# Patient Record
Sex: Male | Born: 1960
Health system: Southern US, Community
[De-identification: ages and names within clinical notes are randomized; demographics above are authoritative.]

## PROBLEM LIST (undated history)

## (undated) DIAGNOSIS — M109 Gout, unspecified: Secondary | ICD-10-CM

## (undated) DIAGNOSIS — F32A Depression, unspecified: Secondary | ICD-10-CM

## (undated) DIAGNOSIS — T7840XA Allergy, unspecified, initial encounter: Secondary | ICD-10-CM

## (undated) DIAGNOSIS — K5790 Diverticulosis of intestine, part unspecified, without perforation or abscess without bleeding: Secondary | ICD-10-CM

## (undated) DIAGNOSIS — S329XXA Fracture of unspecified parts of lumbosacral spine and pelvis, initial encounter for closed fracture: Secondary | ICD-10-CM

## (undated) DIAGNOSIS — K219 Gastro-esophageal reflux disease without esophagitis: Secondary | ICD-10-CM

## (undated) DIAGNOSIS — M810 Age-related osteoporosis without current pathological fracture: Secondary | ICD-10-CM

## (undated) DIAGNOSIS — N200 Calculus of kidney: Secondary | ICD-10-CM

## (undated) DIAGNOSIS — F419 Anxiety disorder, unspecified: Secondary | ICD-10-CM

## (undated) DIAGNOSIS — Z8619 Personal history of other infectious and parasitic diseases: Secondary | ICD-10-CM

## (undated) DIAGNOSIS — K5792 Diverticulitis of intestine, part unspecified, without perforation or abscess without bleeding: Secondary | ICD-10-CM

## (undated) DIAGNOSIS — D649 Anemia, unspecified: Secondary | ICD-10-CM

## (undated) DIAGNOSIS — K648 Other hemorrhoids: Secondary | ICD-10-CM

## (undated) DIAGNOSIS — J449 Chronic obstructive pulmonary disease, unspecified: Secondary | ICD-10-CM

## (undated) DIAGNOSIS — Z7189 Other specified counseling: Secondary | ICD-10-CM

## (undated) DIAGNOSIS — M069 Rheumatoid arthritis, unspecified: Secondary | ICD-10-CM

## (undated) DIAGNOSIS — I10 Essential (primary) hypertension: Secondary | ICD-10-CM

## (undated) DIAGNOSIS — F329 Major depressive disorder, single episode, unspecified: Secondary | ICD-10-CM

## (undated) DIAGNOSIS — E785 Hyperlipidemia, unspecified: Secondary | ICD-10-CM

## (undated) DIAGNOSIS — C880 Waldenstrom macroglobulinemia: Principal | ICD-10-CM

## (undated) DIAGNOSIS — Z950 Presence of cardiac pacemaker: Secondary | ICD-10-CM

## (undated) DIAGNOSIS — M1990 Unspecified osteoarthritis, unspecified site: Secondary | ICD-10-CM

## (undated) DIAGNOSIS — R0789 Other chest pain: Secondary | ICD-10-CM

## (undated) DIAGNOSIS — D509 Iron deficiency anemia, unspecified: Secondary | ICD-10-CM

## (undated) DIAGNOSIS — R7309 Other abnormal glucose: Secondary | ICD-10-CM

## (undated) HISTORY — DX: Allergy, unspecified, initial encounter: T78.40XA

## (undated) HISTORY — DX: Diverticulosis of intestine, part unspecified, without perforation or abscess without bleeding: K57.90

## (undated) HISTORY — DX: Essential (primary) hypertension: I10

## (undated) HISTORY — DX: Diverticulitis of intestine, part unspecified, without perforation or abscess without bleeding: K57.92

## (undated) HISTORY — DX: Other abnormal glucose: R73.09

## (undated) HISTORY — DX: Anemia, unspecified: D64.9

## (undated) HISTORY — DX: Unspecified osteoarthritis, unspecified site: M19.90

## (undated) HISTORY — DX: Rheumatoid arthritis, unspecified: M06.9

## (undated) HISTORY — DX: Anxiety disorder, unspecified: F41.9

## (undated) HISTORY — PX: SPINE SURGERY: SHX786

## (undated) HISTORY — DX: Iron deficiency anemia, unspecified: D50.9

## (undated) HISTORY — DX: Depression, unspecified: F32.A

## (undated) HISTORY — DX: Hyperlipidemia, unspecified: E78.5

## (undated) HISTORY — PX: FINGER SURGERY: SHX640

## (undated) HISTORY — DX: Other specified counseling: Z71.89

## (undated) HISTORY — DX: Age-related osteoporosis without current pathological fracture: M81.0

## (undated) HISTORY — DX: Major depressive disorder, single episode, unspecified: F32.9

## (undated) HISTORY — PX: KNEE SURGERY: SHX244

## (undated) HISTORY — DX: Other chest pain: R07.89

## (undated) HISTORY — PX: OTHER SURGICAL HISTORY: SHX169

## (undated) HISTORY — DX: Gout, unspecified: M10.9

## (undated) HISTORY — DX: Calculus of kidney: N20.0

## (undated) HISTORY — DX: Gastro-esophageal reflux disease without esophagitis: K21.9

## (undated) HISTORY — DX: Other hemorrhoids: K64.8

## (undated) HISTORY — PX: COLONOSCOPY: SHX174

## (undated) HISTORY — DX: Waldenstrom macroglobulinemia: C88.0

## (undated) HISTORY — DX: Chronic obstructive pulmonary disease, unspecified: J44.9

## (undated) HISTORY — PX: WISDOM TOOTH EXTRACTION: SHX21

---

## 1994-11-26 HISTORY — PX: ROTATOR CUFF REPAIR: SHX139

## 2001-09-05 ENCOUNTER — Encounter: Payer: Self-pay | Admitting: Specialist

## 2001-09-05 ENCOUNTER — Ambulatory Visit (HOSPITAL_COMMUNITY): Admission: RE | Admit: 2001-09-05 | Discharge: 2001-09-05 | Payer: Self-pay | Admitting: Specialist

## 2001-09-29 ENCOUNTER — Encounter: Payer: Self-pay | Admitting: Family Medicine

## 2001-09-29 ENCOUNTER — Ambulatory Visit (HOSPITAL_COMMUNITY): Admission: RE | Admit: 2001-09-29 | Discharge: 2001-09-29 | Payer: Self-pay | Admitting: Family Medicine

## 2009-03-02 ENCOUNTER — Ambulatory Visit (HOSPITAL_COMMUNITY): Admission: RE | Admit: 2009-03-02 | Discharge: 2009-03-02 | Payer: Self-pay | Admitting: Internal Medicine

## 2010-07-03 ENCOUNTER — Observation Stay (HOSPITAL_COMMUNITY): Admission: EM | Admit: 2010-07-03 | Discharge: 2010-07-04 | Payer: Self-pay | Admitting: Emergency Medicine

## 2010-07-03 ENCOUNTER — Ambulatory Visit: Payer: Self-pay | Admitting: Cardiology

## 2010-07-04 ENCOUNTER — Encounter (INDEPENDENT_AMBULATORY_CARE_PROVIDER_SITE_OTHER): Payer: Self-pay | Admitting: Emergency Medicine

## 2010-07-17 ENCOUNTER — Ambulatory Visit: Payer: Self-pay | Admitting: Cardiovascular Disease

## 2011-02-09 LAB — CBC
HCT: 43.2 % (ref 39.0–52.0)
Hemoglobin: 15.4 g/dL (ref 13.0–17.0)
MCH: 33.8 pg (ref 26.0–34.0)
MCHC: 35.6 g/dL (ref 30.0–36.0)
MCV: 94.7 fL (ref 78.0–100.0)
Platelets: 226 10*3/uL (ref 150–400)
RBC: 4.56 MIL/uL (ref 4.22–5.81)
RDW: 13.4 % (ref 11.5–15.5)
WBC: 9.1 10*3/uL (ref 4.0–10.5)

## 2011-02-09 LAB — DIFFERENTIAL
Basophils Absolute: 0 10*3/uL (ref 0.0–0.1)
Basophils Relative: 0 % (ref 0–1)
Eosinophils Absolute: 0.1 10*3/uL (ref 0.0–0.7)
Eosinophils Relative: 1 % (ref 0–5)
Lymphocytes Relative: 32 % (ref 12–46)
Lymphs Abs: 2.9 10*3/uL (ref 0.7–4.0)
Monocytes Absolute: 0.8 10*3/uL (ref 0.1–1.0)
Monocytes Relative: 9 % (ref 3–12)
Neutro Abs: 5.2 10*3/uL (ref 1.7–7.7)
Neutrophils Relative %: 58 % (ref 43–77)

## 2011-02-09 LAB — BASIC METABOLIC PANEL
Calcium: 9.8 mg/dL (ref 8.4–10.5)
Chloride: 101 mEq/L (ref 96–112)
Creatinine, Ser: 1.12 mg/dL (ref 0.4–1.5)
GFR calc Af Amer: >60 mL/min (ref >60)
Sodium: 136 mEq/L (ref 135–145)

## 2011-02-09 LAB — POCT CARDIAC MARKERS
CKMB, poc: 1.8 ng/mL (ref 1.0–8.0)
Myoglobin, poc: 90.1 ng/mL (ref 12–200)
Myoglobin, poc: 90.6 ng/mL (ref 12–200)
Troponin i, poc: <0.05 ng/mL (ref 0.00–0.09)

## 2011-11-05 ENCOUNTER — Other Ambulatory Visit: Payer: Self-pay | Admitting: Internal Medicine

## 2011-11-05 DIAGNOSIS — R221 Localized swelling, mass and lump, neck: Secondary | ICD-10-CM

## 2011-11-06 ENCOUNTER — Ambulatory Visit
Admission: RE | Admit: 2011-11-06 | Discharge: 2011-11-06 | Disposition: A | Discharge status: Home / Self Care | Payer: 59 | Source: Ambulatory Visit | Attending: Internal Medicine | Admitting: Internal Medicine

## 2011-11-06 DIAGNOSIS — R221 Localized swelling, mass and lump, neck: Secondary | ICD-10-CM

## 2011-11-07 ENCOUNTER — Other Ambulatory Visit: Payer: Self-pay | Admitting: Internal Medicine

## 2011-11-07 DIAGNOSIS — R0609 Other forms of dyspnea: Secondary | ICD-10-CM

## 2011-11-08 ENCOUNTER — Ambulatory Visit
Admission: RE | Admit: 2011-11-08 | Discharge: 2011-11-08 | Disposition: A | Discharge status: Home / Self Care | Payer: 59 | Source: Ambulatory Visit | Attending: Internal Medicine | Admitting: Internal Medicine

## 2011-11-08 DIAGNOSIS — R0989 Other specified symptoms and signs involving the circulatory and respiratory systems: Secondary | ICD-10-CM

## 2011-11-08 DIAGNOSIS — R0609 Other forms of dyspnea: Secondary | ICD-10-CM

## 2012-02-14 ENCOUNTER — Encounter: Payer: Self-pay | Admitting: *Deleted

## 2012-04-29 ENCOUNTER — Encounter: Payer: Self-pay | Admitting: *Deleted

## 2012-07-14 ENCOUNTER — Encounter: Payer: Self-pay | Admitting: Internal Medicine

## 2012-07-25 ENCOUNTER — Ambulatory Visit (AMBULATORY_SURGERY_CENTER): Payer: 59 | Admitting: *Deleted

## 2012-07-25 VITALS — Ht 72.0 in | Wt 172.1 lb

## 2012-07-25 DIAGNOSIS — Z1211 Encounter for screening for malignant neoplasm of colon: Secondary | ICD-10-CM

## 2012-07-25 MED ORDER — NA SULFATE-K SULFATE-MG SULF 17.5-3.13-1.6 GM/177ML PO SOLN: 1.0000 | Freq: Once | ORAL | Status: DC

## 2012-07-25 NOTE — Progress Notes (Signed)
No allergy to eggs or soy products  Release of info formed filled out and given to [facility] CMA to get pt's records from Dr. Kinnie Scales

## 2012-07-29 ENCOUNTER — Encounter: Payer: Self-pay | Admitting: Internal Medicine

## 2012-07-31 ENCOUNTER — Encounter: Payer: 59 | Admitting: Internal Medicine

## 2012-08-08 ENCOUNTER — Ambulatory Visit (AMBULATORY_SURGERY_CENTER): Payer: 59 | Admitting: Internal Medicine

## 2012-08-08 ENCOUNTER — Encounter: Payer: Self-pay | Admitting: Internal Medicine

## 2012-08-08 VITALS — BP 149/75 | HR 70 | Temp 97.1°F | Resp 20 | Ht 72.0 in | Wt 172.0 lb

## 2012-08-08 DIAGNOSIS — D126 Benign neoplasm of colon, unspecified: Secondary | ICD-10-CM

## 2012-08-08 DIAGNOSIS — K635 Polyp of colon: Secondary | ICD-10-CM

## 2012-08-08 DIAGNOSIS — Z1211 Encounter for screening for malignant neoplasm of colon: Secondary | ICD-10-CM

## 2012-08-08 LAB — HM COLONOSCOPY

## 2012-08-08 MED ORDER — SODIUM CHLORIDE 0.9 % IV SOLN: 500.0000 mL | INTRAVENOUS | Status: DC

## 2012-08-08 NOTE — Progress Notes (Signed)
No complaints noted in the recovery room. Maw   

## 2012-08-08 NOTE — Progress Notes (Signed)
Patient did not experience any of the following events: a burn prior to discharge; a fall within the facility; wrong site/side/patient/procedure/implant event; or a hospital transfer or hospital admission upon discharge from the facility. (G8907) Patient did not have preoperative order for IV antibiotic SSI prophylaxis. (G8918)  

## 2012-08-08 NOTE — Patient Instructions (Addendum)
Handouts were given to your care partner on polyps, diverticulosis, high fiber diet and hemorrhoids.  Please hold aspirin, aspirin products, and anti-inflammatory medications for 2 weeks until 08/22/12.  You may resume your other current medications today.  Please call if any questions or concerns. Maw   YOU HAD AN ENDOSCOPIC PROCEDURE TODAY AT THE [facility]: Refer to the procedure report that was given to you for any specific questions about what was found during the examination.  If the procedure report does not answer your questions, please call your gastroenterologist to clarify.  If you requested that your care partner not be given the details of your procedure findings, then the procedure report has been included in a sealed envelope for you to review at your convenience later.  YOU SHOULD EXPECT: Some feelings of bloating in the abdomen. Passage of more gas than usual.  Walking can help get rid of the air that was put into your GI tract during the procedure and reduce the bloating. If you had a lower endoscopy (such as a colonoscopy or flexible sigmoidoscopy) you may notice spotting of blood in your stool or on the toilet paper. If you underwent a bowel prep for your procedure, then you may not have a normal bowel movement for a few days.  DIET: Your first meal following the procedure should be a light meal and then it is ok to progress to your normal diet.  A half-sandwich or bowl of soup is an example of a good first meal.  Heavy or fried foods are harder to digest and may make you feel nauseous or bloated.  Likewise meals heavy in dairy and vegetables can cause extra gas to form and this can also increase the bloating.  Drink plenty of fluids but you should avoid alcoholic beverages for 24 hours.  ACTIVITY: Your care partner should take you home directly after the procedure.  You should plan to take it easy, moving slowly for the rest of the day.  You can resume normal activity the  day after the procedure however you should NOT DRIVE or use heavy machinery for 24 hours (because of the sedation medicines used during the test).    SYMPTOMS TO REPORT IMMEDIATELY: A gastroenterologist can be reached at any hour.  During normal business hours, 8:30 AM to 5:00 PM Monday through Friday, call [phone].  After hours and on weekends, please call the GI answering service at [phone] who will take a message and have the physician on call contact you.   Following lower endoscopy (colonoscopy or flexible sigmoidoscopy):  Excessive amounts of blood in the stool  Significant tenderness or worsening of abdominal pains  Swelling of the abdomen that is new, acute  Fever of 100F or higher   FOLLOW UP: If any biopsies were taken you will be contacted by phone or by letter within the next 1-3 weeks.  Call your gastroenterologist if you have not heard about the biopsies in 3 weeks.  Our staff will call the home number listed on your records the next business day following your procedure to check on you and address any questions or concerns that you may have at that time regarding the information given to you following your procedure. This is a courtesy call and so if there is no answer at the home number and we have not heard from you through the emergency physician on call, we will assume that you have returned to your regular daily activities without incident.  SIGNATURES/CONFIDENTIALITY: You and/or your care partner have signed paperwork which will be entered into your electronic medical record.  These signatures attest to the fact that that the information above on your After Visit Summary has been reviewed and is understood.  Full responsibility of the confidentiality of this discharge information lies with you and/or your care-partner.

## 2012-08-08 NOTE — Progress Notes (Signed)
PROPOFOL PER D MERRITT CRNA, ALL MEDS TITRATED PER CRNA DURING PROCEDURE. SEE SCANNED INTRA PROCEDURE REPORT. EWM 

## 2012-08-08 NOTE — Op Note (Signed)
[facility] [address] [place], [address]   COLONOSCOPY PROCEDURE REPORT  PATIENT: Edwin Webster, Edwin Webster  MR#: [id number] BIRTHDATE: 06-04-1961 , 51  yrs. old GENDER: Male ENDOSCOPIST: Beverley Fiedler, MD REFERRED ON:[id number] Smith PROCEDURE DATE:  08/08/2012 PROCEDURE:   Colonoscopy with cold biopsy polypectomy and Colonoscopy with snare polypectomy ASA CLASS:   Class II INDICATIONS:average risk screening and remote colonoscopy for rectal bleeding > 10 yrs ago. MEDICATIONS: MAC sedation, administered by CRNA and propofol (Diprivan) 350mg  IV  DESCRIPTION OF PROCEDURE:   After the risks benefits and alternatives of the procedure were thoroughly explained, informed consent was obtained.  A digital rectal exam revealed a skin tag. The LB [id number]  endoscope was introduced through the anus and advanced to the cecum, which was identified by both the appendix and ileocecal valve. No adverse events experienced.   The quality of the prep was Suprep good  The instrument was then slowly withdrawn as the colon was fully examined.   COLON FINDINGS: Two sessile polyps ranging between 3-52mm in size were found in the ascending colon.  Polypectomy was performed using cold snare.  All resections were complete and all polyp tissue was completely retrieved.   A sessile polyp measuring 6 mm in size was found in the transverse colon.  A polypectomy was performed with a cold snare.  The resection was complete and the polyp tissue was completely retrieved.   A sessile polyp measuring 3 mm in size was found in the descending colon.  A polypectomy was performed with cold forceps.  The resection was complete and the polyp tissue was completely retrieved.   Three sessile polyps ranging between 3-66mm in size were found in the sigmoid colon.  Polypectomy was performed using cold snare.  All resections were complete and all polyp tissue was completely retrieved.   Mild diverticulosis  was noted in the sigmoid colon.   Moderate sized internal hemorrhoids were found.  Retroflexed views revealed internal hemorrhoids. The time to cecum=4 minutes 00 seconds.  Withdrawal time=19 minutes 24 seconds.  The scope was withdrawn and the procedure completed. COMPLICATIONS: There were no complications.  ENDOSCOPIC IMPRESSION: 1.   Two sessile polyps ranging between 3-68mm in size were found in the ascending colon; Polypectomy was performed using cold snare 2.   Sessile polyp measuring 6 mm in size was found in the transverse colon; polypectomy was performed with a cold snare 3.   Sessile polyp measuring 3 mm in size was found in the descending colon; polypectomy was performed with cold forceps 4.   Three sessile polyps ranging between 3-79mm in size were found in the sigmoid colon; Polypectomy was performed using cold snare 5.   Mild diverticulosis was noted in the sigmoid colon 6.   Moderate sized internal hemorrhoids      RECOMMENDATIONS: 1.  Hold aspirin, aspirin products, and anti-inflammatory medication for 1 week. 2.  High fiber diet 3.  If the polyps removed today are proven to be adenomatous (pre-cancerous) polyps, you will need a colonoscopy in 3 years. Otherwise you should continue to follow colorectal cancer screening guidelines for "routine risk" patients with a colonoscopy in 10 years.  You will receive a letter within 1-2 weeks with the results of your biopsy as well as final recommendations.  Please call my office if you have not received a letter after 3 weeks.   eSigned:  Beverley Fiedler, MD 08/08/2012 10:17 AM   cc: Loree Fee PA and The  Patient   PATIENT NAME:  Edwin Webster, Edwin Webster MR#: [id number]

## 2012-08-11 ENCOUNTER — Telehealth: Payer: Self-pay | Admitting: *Deleted

## 2012-08-11 NOTE — Telephone Encounter (Signed)
  Follow up Call-  Call back number 08/08/2012 08/08/2012  Post procedure Call Back phone  # [phone] cell [phone]  Permission to leave phone message Yes Yes     Patient questions:  Do you have a fever, pain , or abdominal swelling? no Pain Score  0 *  Have you tolerated food without any problems? yes  Have you been able to return to your normal activities? yes  Do you have any questions about your discharge instructions: Diet   no Medications  no Follow up visit  no  Do you have questions or concerns about your Care? no  Actions: * If pain score is 4 or above: No action needed, pain <4.

## 2012-08-12 ENCOUNTER — Encounter: Payer: Self-pay | Admitting: Internal Medicine

## 2013-04-02 ENCOUNTER — Encounter: Payer: Self-pay | Admitting: Cardiovascular Disease

## 2013-11-11 ENCOUNTER — Other Ambulatory Visit: Payer: Self-pay | Admitting: Emergency Medicine

## 2013-11-11 MED ORDER — HYDROCODONE-ACETAMINOPHEN 5-325 MG PO TABS: 1.0000 | ORAL_TABLET | Freq: Four times a day (QID) | ORAL | Status: DC | PRN

## 2013-11-16 ENCOUNTER — Encounter: Payer: Self-pay | Admitting: Internal Medicine

## 2013-11-17 ENCOUNTER — Encounter: Payer: Self-pay | Admitting: Physician Assistant

## 2013-11-17 ENCOUNTER — Ambulatory Visit (INDEPENDENT_AMBULATORY_CARE_PROVIDER_SITE_OTHER): Payer: 59 | Admitting: Physician Assistant

## 2013-11-17 VITALS — BP 122/80 | HR 72 | Temp 99.0°F | Resp 16 | Ht 70.0 in | Wt 181.0 lb

## 2013-11-17 DIAGNOSIS — J0100 Acute maxillary sinusitis, unspecified: Secondary | ICD-10-CM

## 2013-11-17 MED ORDER — AZITHROMYCIN 250 MG PO TABS: ORAL_TABLET | ORAL | Status: DC

## 2013-11-17 MED ORDER — PREDNISONE 20 MG PO TABS: ORAL_TABLET | ORAL | Status: DC

## 2013-11-17 NOTE — Patient Instructions (Addendum)
Your LDL is not in range. Your LDL is the bad cholesterol that can lead to heart attack and stroke. To lower your number you can decrease your fatty foods, red meat, cheese, milk and increase fiber like whole grains and veggies. You can also add a fiber supplement like Metamucil or Benefiber.    The majority of colds are caused by viruses and do not require antibiotics. Please read the rest of this hand out to learn more about the common cold and what you can do to help yourself as well as help prevent the over use of antibiotics.   COMMON COLD SIGNS AND SYMPTOMS - The common cold usually causes nasal congestion, runny nose, and sneezing. A sore throat may be present on the first day but usually resolves quickly. If a cough occurs, it generally develops on about the fourth or fifth day of symptoms, typically when congestion and runny nose are resolving  COMMON COLD COMPLICATIONS - In most cases, colds do not cause serious illness or complications. Most colds last for three to seven days, although many people continue to have symptoms (coughing, sneezing, congestion) for up to two weeks.  One of the more common complications is sinusitis, which is usually caused by viruses and rarely (about 2 percent of the time) by bacteria. Having thick or yellow to green-colored nasal discharge does not mean that bacterial sinusitis has developed; discolored nasal discharge is a normal phase of the common cold.  Lower respiratory infections, such as pneumonia or bronchitis, may develop following a cold.  Infection of the middle ear, or otitis media, can accompany or follow a cold.  COMMON COLD TREATMENT - There is no specific treatment for the viruses that cause the common cold. Most treatments are aimed at relieving some of the symptoms of the cold, but do not shorten or cure the cold. Antibiotics are not useful for treating the common cold; antibiotics are only used to treat illnesses caused by bacteria, not  viruses. Unnecessary use of antibiotics for the treatment of the common cold can cause allergic reactions, diarrhea, or other gastrointestinal symptoms in some patients.  The symptoms of a cold will resolve over time, even without any treatment. People with underlying medical conditions and those who use other over-the-counter or prescription medications should speak with their healthcare provider or pharmacist to ensure that it is safe to use these treatments. The following are treatments that may reduce the symptoms caused by the common cold.  Nasal congestion - Decongestants are good for nasal congestion- if you feel very stuffy but no mucus is coming out, this is the medication that will help you the most.  Pseudoephedrine is a decongestant that can improve nasal congestion. Although a prescription is not required, drugstores in the [place] keep pseudoephedrine behind the counter, so it must be requested from a pharmacist. If you have a heart condition or high blood pressure please use Coricidin BPH instead.   Runny nose - Antihistamines such as diphenhydramine (Benadryl), certazine (Zyrtec) which are best taking at night because they can make you tired OR loratadine (Claritin),  fexafinadine (Allegra) help with a runny nose.   Nasal sprays such an oxymetazoline (Afrin and others) may also give temporary relief of nasal congestion. However, these sprays should never be used for more than two to three days; use for more than three days use can worsen congestion.  Nasocort is now over the counter and can help decrease a runny nose. Please stop the medication if you  have blurry vision or nose bleeds.     Sore throat and headache - Sore throat and headache are best treated with a mild pain reliever such as acetaminophen (Tylenol) or a non-steroidal anti-inflammatory agent such as ibuprofen or naproxen (Motrin or Aleve). These medications should be taken with food to prevent stomach problems. As well  as gargling with warm water and salt.   Cough - Common cough medicine ingredients include guaifenesin and dextromethorphan; these are often combined with other medications in over-the-counter cold formulas. Often a cough is worse at night or first in the morning due to post nasal drip from you nose. You can try to sleep at an angle to decrease a cough.   Alternative treatments - Heated, humidified air can improve symptoms of nasal congestion and runny nose, and causes few to no side effects. A number of alternative products, including vitamin C, doubling up on your vitamin D and herbal products such as echinacea, may help. Certain products, such as nasal gels that contain zinc (eg, Zicam), have been associated with a permanent loss of smell.  Antibiotics - Antibiotics should not be used to treat an uncomplicated common cold. As noted above, colds are caused by viruses. Antibiotics treat bacterial, not viral infections. Some viruses that cause the common cold can also depress the immune system or cause swelling in the lining of the nose or airways; this can, in turn, lead to a bacterial infection. Often you need to give your body 7 days to fight off a common cold while treating the symptoms with the medications listed above. If after 7 days your symptoms are not improving, you are getting worse, you have shortness of breath, chest pain, a fever of over 103 you should seek medical help immediately.   PREVENTION IS THE BEST MEDICINE - Hand washing is an essential and highly effective way to prevent the spread of infection.  Alcohol-based hand rubs are a good alternative for disinfecting hands if a sink is not available.  Hands should be washed before preparing food and eating and after coughing, blowing the nose, or sneezing. While it is not always possible to limit contact with people who may be infected with a cold, touching the eyes, nose, or mouth after direct contact should be avoided when  possible. Sneezing/coughing into the sleeve of one's clothing (at the inner elbow) is another means of containing sprays of saliva and secretions and does not contaminate the hands.

## 2013-11-17 NOTE — Progress Notes (Signed)
   Subjective:    Patient ID: Edwin Webster, male    DOB: 03/06/1961, 52 y.o.   MRN: [id number]  Cough This is a new problem. The current episode started yesterday. The problem has been gradually worsening. The problem occurs constantly. The cough is non-productive. Associated symptoms include chills, a fever, headaches, nasal congestion, postnasal drip and a sore throat. Pertinent negatives include no chest pain, ear congestion, ear pain, heartburn, hemoptysis, myalgias, rash, rhinorrhea, shortness of breath, sweats, weight loss or wheezing. Nothing aggravates the symptoms. Risk factors: on DMARD. He has tried nothing for the symptoms.   Review of Systems  Constitutional: Positive for fever, chills and fatigue. Negative for weight loss.  HENT: Positive for congestion, postnasal drip, sinus pressure and sore throat. Negative for ear pain and rhinorrhea.   Eyes: Negative.   Respiratory: Positive for cough. Negative for hemoptysis, shortness of breath and wheezing.   Cardiovascular: Negative.  Negative for chest pain.  Gastrointestinal: Negative.  Negative for heartburn.  Musculoskeletal: Negative.  Negative for myalgias.  Skin: Negative.  Negative for rash.  Neurological: Positive for headaches.      Objective:   Physical Exam  Constitutional: He appears well-developed and well-nourished.  HENT:  Head: Normocephalic and atraumatic.  Right Ear: External ear normal.  Left Ear: External ear normal.  Nose: Right sinus exhibits maxillary sinus tenderness. Left sinus exhibits maxillary sinus tenderness.  Mouth/Throat: Oropharynx is clear and moist.  Eyes: Conjunctivae are normal. Pupils are equal, round, and reactive to light.  Neck: Normal range of motion. Neck supple.  Cardiovascular: Normal rate, regular rhythm and normal heart sounds.   Pulmonary/Chest: Effort normal and breath sounds normal. He has no wheezes.  Abdominal: Soft. Bowel sounds are normal.  Lymphadenopathy:    He has no  cervical adenopathy.  Skin: Skin is warm and dry.      Assessment & Plan:  Acute maxillary sinusitis - Plan: azithromycin (ZITHROMAX) 250 MG tablet, predniSONE (DELTASONE) 20 MG tablet

## 2014-06-18 ENCOUNTER — Other Ambulatory Visit: Payer: Self-pay | Admitting: Internal Medicine

## 2014-06-22 ENCOUNTER — Encounter: Payer: Self-pay | Admitting: Emergency Medicine

## 2014-07-01 ENCOUNTER — Encounter: Payer: Self-pay | Admitting: Internal Medicine

## 2014-07-01 ENCOUNTER — Encounter: Payer: 59 | Admitting: Internal Medicine

## 2014-07-01 DIAGNOSIS — E785 Hyperlipidemia, unspecified: Secondary | ICD-10-CM | POA: Insufficient documentation

## 2014-07-01 DIAGNOSIS — I10 Essential (primary) hypertension: Secondary | ICD-10-CM | POA: Insufficient documentation

## 2014-07-01 DIAGNOSIS — E782 Mixed hyperlipidemia: Secondary | ICD-10-CM | POA: Insufficient documentation

## 2014-07-01 DIAGNOSIS — E559 Vitamin D deficiency, unspecified: Secondary | ICD-10-CM | POA: Insufficient documentation

## 2014-07-01 DIAGNOSIS — Z79899 Other long term (current) drug therapy: Secondary | ICD-10-CM | POA: Insufficient documentation

## 2014-07-01 NOTE — Progress Notes (Signed)
Patient ID: Edwin Webster, male   DOB: 10-29-1961, 53 y.o.   MRN: [id number]  C A N C E L L E D   - P A T I E N T    H A D       E M E R G E N C Y     S U R G E R Y

## 2014-07-01 NOTE — Progress Notes (Signed)
Patient ID: Edwin Webster, male   DOB: 1961-05-24, 53 y.o.   MRN: [id number]   L a s t   M i n u t e    R e S c h e d u l e

## 2014-07-07 ENCOUNTER — Other Ambulatory Visit: Payer: Self-pay | Admitting: Orthopedic Surgery

## 2014-07-07 DIAGNOSIS — R609 Edema, unspecified: Secondary | ICD-10-CM

## 2014-07-07 DIAGNOSIS — M25561 Pain in right knee: Secondary | ICD-10-CM | POA: Insufficient documentation

## 2014-07-07 DIAGNOSIS — M25569 Pain in unspecified knee: Secondary | ICD-10-CM | POA: Insufficient documentation

## 2014-07-08 ENCOUNTER — Ambulatory Visit
Admission: RE | Admit: 2014-07-08 | Discharge: 2014-07-08 | Disposition: A | Discharge status: Home / Self Care | Payer: 59 | Source: Ambulatory Visit | Attending: Orthopedic Surgery | Admitting: Orthopedic Surgery

## 2014-07-08 DIAGNOSIS — M25561 Pain in right knee: Secondary | ICD-10-CM

## 2014-07-08 DIAGNOSIS — R609 Edema, unspecified: Secondary | ICD-10-CM

## 2014-07-19 DIAGNOSIS — Z9889 Other specified postprocedural states: Secondary | ICD-10-CM | POA: Insufficient documentation

## 2014-08-15 ENCOUNTER — Other Ambulatory Visit: Payer: Self-pay | Admitting: Internal Medicine

## 2014-09-02 ENCOUNTER — Encounter: Payer: Self-pay | Admitting: Internal Medicine

## 2014-09-02 ENCOUNTER — Other Ambulatory Visit: Payer: Self-pay | Admitting: Internal Medicine

## 2014-09-02 ENCOUNTER — Ambulatory Visit (INDEPENDENT_AMBULATORY_CARE_PROVIDER_SITE_OTHER): Payer: 59 | Admitting: Internal Medicine

## 2014-09-02 VITALS — BP 124/80 | HR 64 | Temp 97.9°F | Resp 16 | Ht 69.75 in | Wt 179.0 lb

## 2014-09-02 DIAGNOSIS — R945 Abnormal results of liver function studies: Secondary | ICD-10-CM

## 2014-09-02 DIAGNOSIS — E782 Mixed hyperlipidemia: Secondary | ICD-10-CM

## 2014-09-02 DIAGNOSIS — R6889 Other general symptoms and signs: Secondary | ICD-10-CM

## 2014-09-02 DIAGNOSIS — M0600 Rheumatoid arthritis without rheumatoid factor, unspecified site: Secondary | ICD-10-CM | POA: Insufficient documentation

## 2014-09-02 DIAGNOSIS — Z23 Encounter for immunization: Secondary | ICD-10-CM

## 2014-09-02 DIAGNOSIS — E291 Testicular hypofunction: Secondary | ICD-10-CM

## 2014-09-02 DIAGNOSIS — R7989 Other specified abnormal findings of blood chemistry: Secondary | ICD-10-CM

## 2014-09-02 DIAGNOSIS — Z1212 Encounter for screening for malignant neoplasm of rectum: Secondary | ICD-10-CM

## 2014-09-02 DIAGNOSIS — Z125 Encounter for screening for malignant neoplasm of prostate: Secondary | ICD-10-CM

## 2014-09-02 DIAGNOSIS — E559 Vitamin D deficiency, unspecified: Secondary | ICD-10-CM

## 2014-09-02 DIAGNOSIS — Z113 Encounter for screening for infections with a predominantly sexual mode of transmission: Secondary | ICD-10-CM

## 2014-09-02 DIAGNOSIS — R030 Elevated blood-pressure reading, without diagnosis of hypertension: Secondary | ICD-10-CM

## 2014-09-02 DIAGNOSIS — Z0001 Encounter for general adult medical examination with abnormal findings: Secondary | ICD-10-CM

## 2014-09-02 DIAGNOSIS — Z79899 Other long term (current) drug therapy: Secondary | ICD-10-CM

## 2014-09-02 DIAGNOSIS — I10 Essential (primary) hypertension: Secondary | ICD-10-CM

## 2014-09-02 DIAGNOSIS — I00 Rheumatic fever without heart involvement: Secondary | ICD-10-CM

## 2014-09-02 DIAGNOSIS — M0520 Rheumatoid vasculitis with rheumatoid arthritis of unspecified site: Secondary | ICD-10-CM

## 2014-09-02 DIAGNOSIS — R5381 Other malaise: Secondary | ICD-10-CM

## 2014-09-02 LAB — CBC WITH DIFFERENTIAL/PLATELET
Basophils Absolute: 0.1 10*3/uL (ref 0.0–0.1)
Basophils Relative: 1 % (ref 0–1)
Eosinophils Absolute: 0.1 10*3/uL (ref 0.0–0.7)
Eosinophils Relative: 2 % (ref 0–5)
HCT: 38 % — ABNORMAL LOW (ref 39.0–52.0)
Hemoglobin: 13.1 g/dL (ref 13.0–17.0)
LYMPHS ABS: 2.1 10*3/uL (ref 0.7–4.0)
Lymphocytes Relative: 40 % (ref 12–46)
MCH: 32.6 pg (ref 26.0–34.0)
MCHC: 34.5 g/dL (ref 30.0–36.0)
MCV: 94.5 fL (ref 78.0–100.0)
MONO ABS: 0.7 10*3/uL (ref 0.1–1.0)
MONOS PCT: 13 % — AB (ref 3–12)
Neutro Abs: 2.3 10*3/uL (ref 1.7–7.7)
Neutrophils Relative %: 44 % (ref 43–77)
Platelets: 347 10*3/uL (ref 150–400)
RBC: 4.02 MIL/uL — AB (ref 4.22–5.81)
RDW: 14.6 % (ref 11.5–15.5)
WBC: 5.2 10*3/uL (ref 4.0–10.5)

## 2014-09-02 LAB — HEMOGLOBIN A1C
HEMOGLOBIN A1C: 5.6 % (ref <5.7)
Mean Plasma Glucose: 114 mg/dL (ref <117)

## 2014-09-02 NOTE — Patient Instructions (Signed)
Recommend the book "The END of DIETING" by Dr Baker Janus   and the book "The END of DIABETES " by Dr Excell Seltzer  At [email or web address] - get book & Audio CD's      Being diabetic has a  300% increased risk for heart attack, stroke, cancer, and alzheimer- type vascular dementia. It is very important that you work harder with diet by avoiding all foods that are white except chicken & fish. Avoid white rice ([place] & wild rice is OK), white potatoes (sweetpotatoes in moderation is OK), White bread or wheat bread or anything made out of white flour like bagels, donuts, rolls, buns, biscuits, cakes, pastries, cookies, pizza crust, and pasta (made from white flour & egg whites) - vegetarian pasta or spinach or wheat pasta is OK. Multigrain breads like Arnold's or Pepper[employer], or multigrain sandwich thins or flatbreads.  Diet, exercise and weight loss can reverse and cure diabetes in the early stages.  Diet, exercise and weight loss is very important in the control and prevention of complications of diabetes which affects every system in your body, ie. Brain - dementia/stroke, eyes - glaucoma/blindness, heart - heart attack/heart failure, kidneys - dialysis, stomach - gastric paralysis, intestines - malabsorption, nerves - severe painful neuritis, circulation - gangrene & loss of a leg(s), and finally cancer and Alzheimers.    I recommend avoid fried & greasy foods,  sweets/candy, white rice (Cyphers or wild rice or Quinoa is OK), white potatoes (sweet potatoes are OK) - anything made from white flour - bagels, doughnuts, rolls, buns, biscuits,white and wheat breads, pizza crust and traditional pasta made of white flour & egg white(vegetarian pasta or spinach or wheat pasta is OK).  Multi-grain bread is OK - like multi-grain flat bread or sandwich thins. Avoid alcohol in excess. Exercise is also important.    Eat all the vegetables you want - avoid meat, especially red meat and dairy - especially cheese.  Cheese  is the most concentrated form of trans-fats which is the worst thing to clog up our arteries. Veggie cheese is OK which can be found in the fresh produce section at [employer] or [employer] or [employer]  Preventive Care for Adults A healthy lifestyle and preventive care can promote health and wellness. Preventive health guidelines for men include the following key practices:  A routine yearly physical is a good way to check with your health care provider about your health and preventative screening. It is a chance to share any concerns and updates on your health and to receive a thorough exam.  Visit your dentist for a routine exam and preventative care every 6 months. Brush your teeth twice a day and floss once a day. Good oral hygiene prevents tooth decay and gum disease.  The frequency of eye exams is based on your age, health, family medical history, use of contact lenses, and other factors. Follow your health care provider's recommendations for frequency of eye exams.  Eat a healthy diet. Foods such as vegetables, fruits, whole grains, low-fat dairy products, and lean protein foods contain the nutrients you need without too many calories. Decrease your intake of foods high in solid fats, added sugars, and salt. Eat the right amount of calories for you.Get information about a proper diet from your health care provider, if necessary.  Regular physical exercise is one of the most important things you can do for your health. Most adults should get at least 150 minutes of moderate-intensity exercise (any activity that  increases your heart rate and causes you to sweat) each week. In addition, most adults need muscle-strengthening exercises on 2 or more days a week.  Maintain a healthy weight. The body mass index (BMI) is a screening tool to identify possible weight problems. It provides an estimate of body fat based on height and weight. Your health care provider can find your BMI and can help you  achieve or maintain a healthy weight.For adults 20 years and older:  A BMI below 18.5 is considered underweight.  A BMI of 18.5 to 24.9 is normal.  A BMI of 25 to 29.9 is considered overweight.  A BMI of 30 and above is considered obese.  Maintain normal blood lipids and cholesterol levels by exercising and minimizing your intake of saturated fat. Eat a balanced diet with plenty of fruit and vegetables. Blood tests for lipids and cholesterol should begin at age 17 and be repeated every 5 years. If your lipid or cholesterol levels are high, you are over 50, or you are at high risk for heart disease, you may need your cholesterol levels checked more frequently.Ongoing high lipid and cholesterol levels should be treated with medicines if diet and exercise are not working.  If you smoke, find out from your health care provider how to quit. If you do not use tobacco, do not start.  Lung cancer screening is recommended for adults aged 60-80 years who are at high risk for developing lung cancer because of a history of smoking. A yearly low-dose CT scan of the lungs is recommended for people who have at least a 30-pack-year history of smoking and are a current smoker or have quit within the past 15 years. A pack year of smoking is smoking an average of 1 pack of cigarettes a day for 1 year (for example: 1 pack a day for 30 years or 2 packs a day for 15 years). Yearly screening should continue until the smoker has stopped smoking for at least 15 years. Yearly screening should be stopped for people who develop a health problem that would prevent them from having lung cancer treatment.  If you choose to drink alcohol, do not have more than 2 drinks per day. One drink is considered to be 12 ounces (355 mL) of beer, 5 ounces (148 mL) of wine, or 1.5 ounces (44 mL) of liquor.  High blood pressure causes heart disease and increases the risk of stroke. Your blood pressure should be checked at least every 1-2  years. Ongoing high blood pressure should be treated with medicines, if weight loss and exercise are not effective.  If you are 58-43 years old, ask your health care provider if you should take aspirin to prevent heart disease.  Diabetes screening involves taking a blood sample to check your fasting blood sugar level. Testing should be considered at a younger age or be carried out more frequently if you are overweight and have at least 1 risk factor for diabetes.  Colorectal cancer can be detected and often prevented. Most routine colorectal cancer screening begins at the age of 71 and continues through age 55. However, your health care provider may recommend screening at an earlier age if you have risk factors for colon cancer. On a yearly basis, your health care provider may provide home test kits to check for hidden blood in the stool. Use of a small camera at the end of a tube to directly examine the colon (sigmoidoscopy or colonoscopy) can detect the earliest forms of  colorectal cancer. Talk to your health care provider about this at age 63, when routine screening begins. Direct exam of the colon should be repeated every 5-10 years through age 23, unless early forms of precancerous polyps or small growths are found.  People who are at an increased risk for hepatitis B should be screened for this virus. You are considered at high risk for hepatitis B if:  You were born in a country where hepatitis B occurs often. Talk with your health care provider about which countries are considered high risk.  Your parents were born in a high-risk country and you have not received a shot to protect against hepatitis B (hepatitis B vaccine).  Hepatitis C blood testing is recommended for all people born from [birth year] through 1965 and any individual with known risks for hepatitis C.  Screening for abdominal aortic aneurysm (AAA) and surgical repair of large AAAs by ultrasound are recommended for men ages 32 to 75  years who are current or former smokers.  Talk with your health care provider about prostate cancer screening.  Testicular cancer screening is not recommended for adult males who have no symptoms. Screening includes self-exam, a health care provider exam, and other screening tests. Consult with your health care provider about any symptoms you have or any concerns you have about testicular cancer.  Use sunscreen. Apply sunscreen liberally and repeatedly throughout the day. You should seek shade when your shadow is shorter than you. Protect yourself by wearing long sleeves, pants, a wide-brimmed hat, and sunglasses year round, whenever you are outdoors.  Once a month, do a whole-body skin exam, using a mirror to look at the skin on your back. Tell your health care provider about new moles, moles that have irregular borders, moles that are larger than a pencil eraser, or moles that have changed in shape or color.  Stay current with required vaccines (immunizations).  Influenza vaccine. All adults should be immunized every year.  Tetanus, diphtheria, and acellular pertussis (Td, Tdap) vaccine. An adult who has not previously received Tdap or who does not know his vaccine status should receive 1 dose of Tdap. This initial dose should be followed by tetanus and diphtheria toxoids (Td) booster doses every 10 years. Adults with an unknown or incomplete history of completing a 3-dose immunization series with Td-containing vaccines should begin or complete a primary immunization series including a Tdap dose. Adults should receive a Td booster every 10 years.  Zoster vaccine. One dose is recommended for adults aged 53 years or older unless certain conditions are present.  Measles, mumps, and rubella (MMR) vaccine. Adults born before [birth year] generally are considered immune to measles and mumps. Adults born in [birth year] or later should have 1 or more doses of MMR vaccine unless there is a contraindication to the  vaccine or there is laboratory evidence of immunity to each of the three diseases. A routine second dose of MMR vaccine should be obtained at least 28 days after the first dose for students attending postsecondary schools, health care workers, or international travelers. People who received inactivated measles vaccine or an unknown type of measles vaccine during 1963-1967 should receive 2 doses of MMR vaccine. People who received inactivated mumps vaccine or an unknown type of mumps vaccine before 1979 and are at high risk for mumps infection should consider immunization with 2 doses of MMR vaccine. Unvaccinated health care workers born before 1957 who lack laboratory evidence of measles, mumps, or rubella immunity or laboratory confirmation of  disease should consider measles and mumps immunization with 2 doses of MMR vaccine or rubella immunization with 1 dose of MMR vaccine.    Pneumococcal 13-valent conjugate (PCV13) vaccine. When indicated, a person who is uncertain of his immunization history and has no record of immunization should receive the PCV13 vaccine. An adult aged 62 years or older who has certain medical conditions and has not been previously immunized should receive 1 dose of PCV13 vaccine. This PCV13 should be followed with a dose of pneumococcal polysaccharide (PPSV23) vaccine. The PPSV23 vaccine dose should be obtained at least 8 weeks after the dose of PCV13 vaccine. An adult aged 78 years or older who has certain medical conditions and previously received 1 or more doses of PPSV23 vaccine should receive 1 dose of PCV13. The PCV13 vaccine dose should be obtained 1 or more years after the last PPSV23 vaccine dose.    Pneumococcal polysaccharide (PPSV23) vaccine. When PCV13 is also indicated, PCV13 should be obtained first. All adults aged 22 years and older should be immunized. An adult younger than age 71 years who has certain medical conditions should be immunized. Any person who resides  in a nursing home or long-term care facility should be immunized. An adult smoker should be immunized. People with an immunocompromised condition and certain other conditions should receive both PCV13 and PPSV23 vaccines. People with human immunodeficiency virus (HIV) infection should be immunized as soon as possible after diagnosis. Immunization during chemotherapy or radiation therapy should be avoided. Routine use of PPSV23 vaccine is not recommended for American Indians, [place] Natives, or people younger than 65 years unless there are medical conditions that require PPSV23 vaccine. When indicated, people who have unknown immunization and have no record of immunization should receive PPSV23 vaccine. One-time revaccination 5 years after the first dose of PPSV23 is recommended for people aged 19-64 years who have chronic kidney failure, nephrotic syndrome, asplenia, or immunocompromised conditions. People who received 1-2 doses of PPSV23 before age 46 years should receive another dose of PPSV23 vaccine at age 20 years or later if at least 5 years have passed since the previous dose. Doses of PPSV23 are not needed for people immunized with PPSV23 at or after age 1 years.   Preventive Service / Frequency   Ages 61 to 42  Blood pressure check.** / Every 1 to 2 years.  Lipid and cholesterol check.** / Every 5 years beginning at age 18.  Lung cancer screening. / Every year if you are aged 70-80 years and have a 30-pack-year history of smoking and currently smoke or have quit within the past 15 years. Yearly screening is stopped once you have quit smoking for at least 15 years or develop a health problem that would prevent you from having lung cancer treatment.  Fecal occult blood test (FOBT) of stool. / Every year beginning at age 50 and continuing until age 24. You may not have to do this test if you get a colonoscopy every 10 years.  Flexible sigmoidoscopy** or colonoscopy.** / Every 5 years for a  flexible sigmoidoscopy or every 10 years for a colonoscopy beginning at age 60 and continuing until age 17.  Hepatitis C blood test.** / For all people born from 50 through 1965 and any individual with known risks for hepatitis C.  Skin self-exam. / Monthly.  Influenza vaccine. / Every year.  Tetanus, diphtheria, and acellular pertussis (Tdap/Td) vaccine.** / Consult your health care provider. 1 dose of Td every 10 years.  Varicella vaccine.** /  Consult your health care provider.  Zoster vaccine.** / 1 dose for adults aged 60 years or older.  Measles, mumps, rubella (MMR) vaccine.** / You need at least 1 dose of MMR if you were born in 1957 or later. You may also need a second dose.  Pneumococcal 13-valent conjugate (PCV13) vaccine.** / Consult your health care provider.  Pneumococcal polysaccharide (PPSV23) vaccine.** / 1 to 2 doses if you smoke cigarettes or if you have certain conditions.  Meningococcal vaccine.** / Consult your health care provider.  Hepatitis A vaccine.** / Consult your health care provider.  Hepatitis B vaccine.** / Consult your health care provider.   

## 2014-09-02 NOTE — Progress Notes (Signed)
Patient ID: Edwin Webster, male   DOB: [birth year], 53 y.o.   MRN: [id number]  Annual Screening Comprehensive Examination  This very nice 53 y.o.MWM presents for complete physical.  Patient has been followed for labile HTN,   Prediabetes, Hyperlipidemia, and Vitamin D Deficiency.   Patient has labile HTN since 2013 monitored expectantly. Patient's BP has been controlled and today's BP: 124/80 mmHg. Patient does have Stage 2 CKD (GFR 75 ml/min).  Patient denies any cardiac symptoms as chest pain, palpitations, shortness of breath, dizziness or ankle swelling.   Patient's hyperlipidemia is not controlled with diet. Last lipids were TC 202, TG 108, HDL 42 AND LDL 138 - high.   Patient is screened for prediabetes and patient denies reactive hypoglycemic symptoms, visual blurring, diabetic polys or paresthesias. Last A1c was 5.4% .     Patient was Dx'd with seronegative RHeu Arthritis by Dr Ouida Sills in 01/2013 by hand U/S with (+) anti- DNA and neg RA latex and anti -CCP. Patient reports marked improvement and essential resolution of his Sx' s of arthralgias in his hands/fingers since started on DMARD agents monitored by Dr Ouida Sills.   Finally, patient has history of Vitamin D Deficiency of 87 in Apr 2014 and last vitamin D was 60 in Oct 2014.    Medication List   folic acid 1 MG tablet  Commonly known as:  FOLVITE  Take 1 mg by mouth.     HYDROcodone-acetaminophen 5-325 MG per tablet  Commonly known as:  NORCO/VICODIN  Take 1 tablet by mouth every 6 (six) hours as needed for moderate pain.     levocetirizine 5 MG tablet  Commonly known as:  XYZAL  Take 5 mg by mouth every evening.     METHOTREXATE (PF) [place]  Inject into the skin once a week.     REMICADE 100 MG injection  Generic drug:  inFLIXimab  Inject into the vein. patient gets IV infusion 8 times a year.     simvastatin 40 MG tablet  Commonly known as:  ZOCOR  Take by mouth.     Vitamin D-3 5000 UNITS Tabs  Take 5,000 Units by  mouth daily.     No Known Allergies  Past Medical History  Diagnosis Date  . Chest pain, non-cardiac   . Hyperlipidemia   . Allergy   . Anxiety   . Arthritis   . Depression   . COPD (chronic obstructive pulmonary disease)   . Gout   . GERD (gastroesophageal reflux disease)   . Diverticulitis    Past Surgical History  Procedure Laterality Date  . Rotator cuff repair  1996    left  . Knee surgery      right   Family History  Problem Relation Age of Onset  . Hyperlipidemia Mother   . COPD Mother   . Heart failure Mother   . Cancer Mother     breast  . Hyperlipidemia Brother   . Colon cancer Maternal Grandfather   . Stroke Maternal Grandfather   . COPD Maternal Grandfather     colon  . Esophageal cancer Neg Hx   . Stomach cancer Neg Hx   . Rectal cancer Neg Hx   . Hypertension Father    History   Social History  . Marital Status: Married    Spouse Name: N/A    Number of Children: N/A  . Years of Education: N/A   Occupational History  . Electrician   Social History Main Topics  . Smoking  status: Former Smoker -- 2.00 packs/day    Types: Cigarettes    Quit date: 07/03/2010  . Smokeless tobacco: Never Used  . Alcohol Use: No  . Drug Use: No  . Sexual Activity: Not on file    ROS Constitutional: Denies fever, chills, weight loss/gain, headaches, insomnia, fatigue, night sweats or change in appetite. Eyes: Denies redness, blurred vision, diplopia, discharge, itchy or watery eyes.  ENT: Denies discharge, congestion, post nasal drip, epistaxis, sore throat, earache, hearing loss, dental pain, Tinnitus, Vertigo, Sinus pain or snoring.  Cardio: Denies chest pain, palpitations, irregular heartbeat, syncope, dyspnea, diaphoresis, orthopnea, PND, claudication or edema Respiratory: denies cough, dyspnea, DOE, pleurisy, hoarseness, laryngitis or wheezing.  Gastrointestinal: Denies dysphagia, heartburn, reflux, water brash, pain, cramps, nausea, vomiting, bloating,  diarrhea, constipation, hematemesis, melena, hematochezia, jaundice or hemorrhoids Genitourinary: Denies dysuria, frequency, urgency, nocturia, hesitancy, discharge, hematuria or flank pain Musculoskeletal: Denies arthralgia, myalgia, stiffness, Jt. Swelling, pain, limp or strain/sprain. Denies Falls. Skin: Denies puritis, rash, hives, warts, acne, eczema or change in skin lesion Neuro: No weakness, tremor, incoordination, spasms, paresthesia or pain Psychiatric: Denies confusion, memory loss or sensory loss. Denies Depression. Endocrine: Denies change in weight, skin, hair change, nocturia, and paresthesia, diabetic polys, visual blurring or hyper / hypo glycemic episodes.  Heme/Lymph: No excessive bleeding, bruising or enlarged lymph nodes.  Physical Exam  BP 124/80  Pulse 64  Temp 97.9 F   Resp 16  Ht 5' 9.75"   Wt 179 lb   BMI 25.86   General Appearance: Well nourished, in no apparent distress. Eyes: PERRLA, EOMs, conjunctiva no swelling or erythema, normal fundi and vessels. Sinuses: No frontal/maxillary tenderness ENT/Mouth: EACs patent / TMs  nl. Nares clear without erythema, swelling, mucoid exudates. Oral hygiene is good. No erythema, swelling, or exudate. Tongue normal, non-obstructing. Tonsils not swollen or erythematous. Hearing normal.  Neck: Supple, thyroid normal. No bruits, nodes or JVD. Respiratory: Respiratory effort normal.  BS equal and clear bilateral without rales, rhonci, wheezing or stridor. Cardio: Heart sounds are normal with regular rate and rhythm and no murmurs, rubs or gallops. Peripheral pulses are normal and equal bilaterally without edema. No aortic or femoral bruits. Chest: symmetric with normal excursions and percussion.  Abdomen: Flat, soft, with bowl sounds. Nontender, no guarding, rebound, hernias, masses, or organomegaly.  Lymphatics: Non tender without lymphadenopathy.  Genitourinary: No hernias.Testes nl. DRE - prostate nl for age - smooth & firm  w/o nodules. Musculoskeletal: Full ROM all peripheral extremities, joint stability, 5/5 strength, and normal gait. Skin: Warm and dry without rashes, lesions, cyanosis, clubbing or  ecchymosis.  Neuro: Cranial nerves intact, reflexes equal bilaterally. Normal muscle tone, no cerebellar symptoms. Sensation intact.  Pysch: Awake and oriented X 3with normal affect, insight and judgment appropriate.   Assessment and Plan  1. Annual Screening Examination 2. Elevated BP, Hx 3. Hyperlipidemia 4. Pre DiabetesScreening 5. Vitamin D Deficiency 6. SeroNegative RA   Continue prudent diet as discussed, weight control, BP monitoring, regular exercise, and medications as discussed.  Discussed med effects and SE's. Routine screening labs and tests as requested with regular follow-up as recommended.

## 2014-09-03 LAB — HEPATITIS B CORE ANTIBODY, TOTAL: Hep B Core Total Ab: NONREACTIVE

## 2014-09-03 LAB — URINALYSIS, MICROSCOPIC ONLY
BACTERIA UA: NONE SEEN
CASTS: NONE SEEN
CRYSTALS: NONE SEEN
Squamous Epithelial / LPF: NONE SEEN

## 2014-09-03 LAB — BASIC METABOLIC PANEL WITH GFR
BUN: 16 mg/dL (ref 6–23)
CO2: 25 meq/L (ref 19–32)
CREATININE: 0.99 mg/dL (ref 0.50–1.35)
Calcium: 9.5 mg/dL (ref 8.4–10.5)
Chloride: 100 mEq/L (ref 96–112)
GFR, Est African American: >89 mL/min
GFR, Est Non African American: 85 mL/min
GLUCOSE: 81 mg/dL (ref 70–99)
Potassium: 4.4 mEq/L (ref 3.5–5.3)
SODIUM: 136 meq/L (ref 135–145)

## 2014-09-03 LAB — LIPID PANEL
CHOL/HDL RATIO: 4.4 ratio
Cholesterol: 194 mg/dL (ref 0–200)
HDL: 44 mg/dL (ref >39)
LDL Cholesterol: 134 mg/dL — ABNORMAL HIGH (ref 0–99)
Triglycerides: 78 mg/dL (ref <150)
VLDL: 16 mg/dL (ref 0–40)

## 2014-09-03 LAB — HIV ANTIBODY (ROUTINE TESTING W REFLEX): HIV 1&2 Ab, 4th Generation: NONREACTIVE

## 2014-09-03 LAB — HEPATIC FUNCTION PANEL
ALT: 18 U/L (ref 0–53)
AST: 20 U/L (ref 0–37)
Albumin: 4.3 g/dL (ref 3.5–5.2)
Alkaline Phosphatase: 77 U/L (ref 39–117)
Bilirubin, Direct: 0.1 mg/dL (ref 0.0–0.3)
Indirect Bilirubin: 0.6 mg/dL (ref 0.2–1.2)
Total Bilirubin: 0.7 mg/dL (ref 0.2–1.2)
Total Protein: 7.3 g/dL (ref 6.0–8.3)

## 2014-09-03 LAB — MICROALBUMIN / CREATININE URINE RATIO
Creatinine, Urine: 171.2 mg/dL
Microalb Creat Ratio: 4.7 mg/g (ref 0.0–30.0)
Microalb, Ur: 0.8 mg/dL

## 2014-09-03 LAB — SYPHILIS: RPR W/REFLEX TO RPR TITER AND TREPONEMAL ANTIBODIES, TRADITIONAL SCREENING AND DIAGNOSIS ALGORITHM

## 2014-09-03 LAB — MAGNESIUM: Magnesium: 2 mg/dL (ref 1.5–2.5)

## 2014-09-03 LAB — HEPATITIS A ANTIBODY, TOTAL: Hep A Total Ab: BORDERLINE — AB

## 2014-09-03 LAB — HEPATITIS C ANTIBODY: HCV Ab: NEGATIVE

## 2014-09-03 LAB — VITAMIN B12: Vitamin B-12: 348 pg/mL (ref 211–911)

## 2014-09-03 LAB — INSULIN, FASTING: Insulin fasting, serum: <1 u[IU]/mL — ABNORMAL LOW (ref 2.0–19.6)

## 2014-09-03 LAB — TESTOSTERONE: Testosterone: 496 ng/dL (ref 300–890)

## 2014-09-03 LAB — TSH: TSH: 1.172 u[IU]/mL (ref 0.350–4.500)

## 2014-09-03 LAB — PSA: PSA: 0.39 ng/mL (ref <=4.00)

## 2014-09-03 LAB — VITAMIN D 25 HYDROXY (VIT D DEFICIENCY, FRACTURES): VIT D 25 HYDROXY: 54 ng/mL (ref 30–89)

## 2014-09-05 ENCOUNTER — Other Ambulatory Visit: Payer: Self-pay | Admitting: Internal Medicine

## 2014-09-05 MED ORDER — ATORVASTATIN CALCIUM 80 MG PO TABS: ORAL_TABLET | ORAL | Status: DC

## 2014-09-06 LAB — HEPATITIS B E ANTIBODY: Hepatitis Be Antibody: NONREACTIVE

## 2014-09-07 LAB — HEPATITIS B SURFACE ANTIBODY, QUANTITATIVE: Hepatitis B-Post: 0 m[IU]/mL

## 2014-11-01 ENCOUNTER — Other Ambulatory Visit: Payer: Self-pay | Admitting: *Deleted

## 2014-11-01 MED ORDER — ATORVASTATIN CALCIUM 80 MG PO TABS: ORAL_TABLET | ORAL | Status: DC

## 2014-11-26 HISTORY — PX: COLONOSCOPY: SHX174

## 2014-12-08 ENCOUNTER — Ambulatory Visit: Payer: Self-pay | Admitting: Physician Assistant

## 2015-02-17 ENCOUNTER — Other Ambulatory Visit: Payer: Self-pay | Admitting: Internal Medicine

## 2015-02-21 ENCOUNTER — Other Ambulatory Visit: Payer: Self-pay | Admitting: Internal Medicine

## 2015-03-16 ENCOUNTER — Encounter: Payer: Self-pay | Admitting: Physician Assistant

## 2015-03-16 ENCOUNTER — Ambulatory Visit (INDEPENDENT_AMBULATORY_CARE_PROVIDER_SITE_OTHER): Payer: 59 | Admitting: Physician Assistant

## 2015-03-16 VITALS — BP 120/78 | HR 68 | Resp 16 | Ht 69.75 in | Wt 162.0 lb

## 2015-03-16 DIAGNOSIS — J0100 Acute maxillary sinusitis, unspecified: Secondary | ICD-10-CM

## 2015-03-16 MED ORDER — PREDNISONE 20 MG PO TABS: ORAL_TABLET | ORAL | Status: DC

## 2015-03-16 MED ORDER — AZITHROMYCIN 250 MG PO TABS
ORAL_TABLET | ORAL | Status: AC
Start: 2015-03-16 — End: 2015-03-21

## 2015-03-16 NOTE — Progress Notes (Signed)
Subjective:    Patient ID: Edwin Webster, male    DOB: Oct 13, 1961, 54 y.o.   MRN: [id number]  HPI 54 y.o. male presents with history of RA on DMARD, COPD, history of smoking but quit presents with 1 week of sinus symptoms. Has been taking OTC medications, sudafed, allergy meds, was getting better but then got worse. Had fever last night with cold chills, sinus pressure, green mucus. Denies cough, SOB, CP.   Review of Systems  Constitutional: Positive for fever and chills. Negative for diaphoresis and fatigue.  HENT: Positive for congestion, postnasal drip, rhinorrhea, sinus pressure and sore throat. Negative for ear pain, sneezing, trouble swallowing and voice change.   Eyes: Negative.   Respiratory: Negative for cough, chest tightness, shortness of breath and wheezing.   Cardiovascular: Negative.   Gastrointestinal: Negative.   Genitourinary: Negative.   Musculoskeletal: Negative for neck pain.  Neurological: Negative.  Negative for headaches.   Current Outpatient Prescriptions on File Prior to Visit  Medication Sig Dispense Refill  . atorvastatin (LIPITOR) 80 MG tablet Take 1/2 to 1 tablet as directed for cholesterol 90 tablet 3  . Cholecalciferol (VITAMIN D-3) 5000 UNITS TABS Take 5,000 Units by mouth daily.    . citalopram (CELEXA) 20 MG tablet TAKE 1 TABLET EVERY DAY 90 tablet 0  . folic acid (FOLVITE) 1 MG tablet Take 1 mg by mouth.    Marland Kitchen HYDROcodone-acetaminophen (NORCO/VICODIN) 5-325 MG per tablet Take 1 tablet by mouth every 6 (six) hours as needed for moderate pain.    Marland Kitchen levocetirizine (XYZAL) 5 MG tablet Take 5 mg by mouth every evening.    . Methotrexate, Anti-Rheumatic, (METHOTREXATE, PF, [place]) Inject into the skin once a week.     No current facility-administered medications on file prior to visit.   Past Medical History  Diagnosis Date  . Chest pain, non-cardiac   . Hyperlipidemia   . Allergy   . Anxiety   . Arthritis   . Depression   . COPD (chronic obstructive  pulmonary disease)   . Gout   . GERD (gastroesophageal reflux disease)   . Diverticulitis       Objective:   Physical Exam  Constitutional: He is oriented to person, place, and time. He appears well-developed and well-nourished.  HENT:  Head: Normocephalic and atraumatic.  Right Ear: Hearing, tympanic membrane and external ear normal.  Left Ear: Hearing, tympanic membrane and external ear normal.  Nose: Right sinus exhibits maxillary sinus tenderness. Left sinus exhibits maxillary sinus tenderness.  Mouth/Throat: Uvula is midline, oropharynx is clear and moist and mucous membranes are normal. No posterior oropharyngeal edema or posterior oropharyngeal erythema.  Eyes: Conjunctivae are normal. Pupils are equal, round, and reactive to light.  Neck: Normal range of motion. Neck supple.  Cardiovascular: Normal rate, regular rhythm and normal heart sounds.   Pulmonary/Chest: Effort normal and breath sounds normal. He has no wheezes.  Abdominal: Soft. Bowel sounds are normal.  Musculoskeletal: Normal range of motion.  Lymphadenopathy:    He has cervical adenopathy.  Neurological: He is alert and oriented to person, place, and time.  Skin: Skin is warm and dry. No rash noted.       Assessment & Plan:  1. Acute maxillary sinusitis, recurrence not specified [J01.00] Will hold the zpak and take if she is not getting better, increase fluids, rest, cont allergy pill - predniSONE (DELTASONE) 20 MG tablet; 2 tablets daily for 3 days, 1 tablet daily for 4 days.  Dispense: 10 tablet;  Refill: 0 - azithromycin (ZITHROMAX) 250 MG tablet; Take 2 tablets (500 mg) on  Day 1,  followed by 1 tablet (250 mg) once daily on Days 2 through 5.  Dispense: 6 each; Refill: 1  Future Appointments Date Time Provider [place]  03/21/2015 4:30 PM Unk Pinto, MD [facility] None  09/08/2015 10:00 AM Unk Pinto, MD [facility] None

## 2015-03-16 NOTE — Patient Instructions (Signed)
Sinusitis can be uncomfortable. People with sinusitis have congestion with yellow/green/gray discharge, sinus pain/pressure, pain around the eyes. Sinus infections almost ALWAYS stem from a viral infection and antibiotics don't work against a virus. Even when bacteria is responsible, the infections usually clear up on their own in a week or so.   PLEASE TRY TO DO OVER THE COUNTER TREATMENT AND PREDNISONE FOR 5-7 DAYS AND IF YOU ARE NOT GETTING BETTER OR GETTING WORSE THEN YOU CAN START ON AN ANTIBIOTIC GIVEN.  Can take the prednisone AT NIGHT WITH DINNER, it take 8-12 hours to start working so it will NOT affect your sleeping if you take it at night with your food!! Take two pills the first night and 1 or two pill the second night and then 1 pill the other nights.   Risk of antibiotic use: About 1 in 4 people who take antibiotics have side effects including stomach problems, dizziness, or rashes. Those problems clear up soon after stopping the drugs, but in rare cases antibiotics can cause severe allergic reaction. Over use of antibiotics also encourages the growth of bacteria that can't be controlled easily with drugs. That makes you more vunerable to antibiotic-resistant infections and undermines the benefits of antibiotics for others.   Waste of Money: Antibiotics often aren't very expensive, but any money spent on unnecessary drugs is money down the drain.   When are antibiotics needed? Only when symptoms last longer than a week.  Start to improve but then worsen again  -It can take up to 2 weeks to feel better.   -If you do not get better in 7-10 days (Have fever, facial pain, dental pain and swelling), then please call the office and it is now appropriate to start an antibiotic.   -Please take Tylenol or Ibuprofen for pain. -Acetaminiphen 325mg orally every 4-6 hours for pain.  Max: 10 per day -Ibuprofen 200mg orally every 6-8 hours for pain.  Take with food to avoid ulcers.   Max 10 per  day  Please pick one of the over the counter allergy medications below and take it once daily for allergies.  Claritin or loratadine cheapest but likely the weakest  Zyrtec or certizine at night because it can make you sleepy The strongest is allegra or fexafinadine  Cheapest at [employer], [employer], [employer]  -While drinking fluids, pinch and hold nose close and swallow.  This will help open up your eustachian tubes to drain the fluid behind your ear drums. -Try steam showers to open your nasal passages.   Drink lots of water to stay hydrated and to thin mucous.  Flonase/Nasonex is to help the inflammation.  Take 2 sprays in each nostril at bedtime.  Make sure you spray towards the outside of each nostril towards the outer corner of your eye, hold nose close and tilt head back.  This will help the medication get into your sinuses.  If you do not like this medication, then use saline nasal sprays same directions as above for Flonase. Stop the medication right away if you get blurring of your vision or nose bleeds.  Sinusitis Sinusitis is redness, soreness, and inflammation of the paranasal sinuses. Paranasal sinuses are air pockets within the bones of your face (beneath the eyes, the middle of the forehead, or above the eyes). In healthy paranasal sinuses, mucus is able to drain out, and air is able to circulate through them by way of your nose. However, when your paranasal sinuses are inflamed, mucus and air can   become trapped. This can allow bacteria and other germs to grow and cause infection. Sinusitis can develop quickly and last only a short time (acute) or continue over a long period (chronic). Sinusitis that lasts for more than 12 weeks is considered chronic.  CAUSES  Causes of sinusitis include: Allergies. Structural abnormalities, such as displacement of the cartilage that separates your nostrils (deviated septum), which can decrease the air flow through your nose and sinuses and affect sinus  drainage. Functional abnormalities, such as when the small hairs (cilia) that line your sinuses and help remove mucus do not work properly or are not present. SIGNS AND SYMPTOMS  Symptoms of acute and chronic sinusitis are the same. The primary symptoms are pain and pressure around the affected sinuses. Other symptoms include: Upper toothache. Earache. Headache. Bad breath. Decreased sense of smell and taste. A cough, which worsens when you are lying flat. Fatigue. Fever. Thick drainage from your nose, which often is green and may contain pus (purulent). Swelling and warmth over the affected sinuses. DIAGNOSIS  Your health care provider will perform a physical exam. During the exam, your health care provider may: Look in your nose for signs of abnormal growths in your nostrils (nasal polyps).  Tap over the affected sinus to check for signs of infection. View the inside of your sinuses (endoscopy) using an imaging device that has a light attached (endoscope). If your health care provider suspects that you have chronic sinusitis, one or more of the following tests may be recommended: Allergy tests. Nasal culture. A sample of mucus is taken from your nose, sent to a lab, and screened for bacteria. Nasal cytology. A sample of mucus is taken from your nose and examined by your health care provider to determine if your sinusitis is related to an allergy. TREATMENT  Most cases of acute sinusitis are related to a viral infection and will resolve on their own within 10 days. Sometimes medicines are prescribed to help relieve symptoms (pain medicine, decongestants, nasal steroid sprays, or saline sprays).  However, for sinusitis related to a bacterial infection, your health care provider will prescribe antibiotic medicines. These are medicines that will help kill the bacteria causing the infection.  Rarely, sinusitis is caused by a fungal infection. In theses cases, your health care provider will  prescribe antifungal medicine. For some cases of chronic sinusitis, surgery is needed. Generally, these are cases in which sinusitis recurs more than 3 times per year, despite other treatments. HOME CARE INSTRUCTIONS  Drink plenty of water. Water helps thin the mucus so your sinuses can drain more easily. Use a humidifier. Inhale steam 3 to 4 times a day (for example, sit in the bathroom with the shower running). Apply a warm, moist washcloth to your face 3 to 4 times a day, or as directed by your health care provider. Use saline nasal sprays to help moisten and clean your sinuses. Take medicines only as directed by your health care provider. If you were prescribed either an antibiotic or antifungal medicine, finish it all even if you start to feel better. SEEK IMMEDIATE MEDICAL CARE IF: You have increasing pain or severe headaches. You have nausea, vomiting, or drowsiness. You have swelling around your face. You have vision problems. You have a stiff neck. You have difficulty breathing. MAKE SURE YOU:  Understand these instructions. Will watch your condition. Will get help right away if you are not doing well or get worse. Document Released: 11/12/2005 Document Revised: 03/29/2014 Document Reviewed: 11/27/2011 [employer]   Patient Information 2015 [employer]. This information is not intended to replace advice given to you by your health care provider. Make sure you discuss any questions you have with your health care provider.   

## 2015-03-21 ENCOUNTER — Ambulatory Visit (INDEPENDENT_AMBULATORY_CARE_PROVIDER_SITE_OTHER): Payer: 59 | Admitting: Internal Medicine

## 2015-03-21 ENCOUNTER — Ambulatory Visit: Payer: 59 | Admitting: Internal Medicine

## 2015-03-21 ENCOUNTER — Encounter: Payer: Self-pay | Admitting: Internal Medicine

## 2015-03-21 VITALS — BP 138/82 | HR 68 | Temp 98.6°F | Resp 18 | Ht 69.75 in | Wt 164.0 lb

## 2015-03-21 DIAGNOSIS — R030 Elevated blood-pressure reading, without diagnosis of hypertension: Secondary | ICD-10-CM

## 2015-03-21 DIAGNOSIS — R7309 Other abnormal glucose: Secondary | ICD-10-CM

## 2015-03-21 DIAGNOSIS — E559 Vitamin D deficiency, unspecified: Secondary | ICD-10-CM

## 2015-03-21 DIAGNOSIS — R739 Hyperglycemia, unspecified: Secondary | ICD-10-CM

## 2015-03-21 DIAGNOSIS — Z79899 Other long term (current) drug therapy: Secondary | ICD-10-CM

## 2015-03-21 DIAGNOSIS — E782 Mixed hyperlipidemia: Secondary | ICD-10-CM

## 2015-03-21 LAB — BASIC METABOLIC PANEL WITH GFR
BUN: 18 mg/dL (ref 6–23)
CO2: 29 meq/L (ref 19–32)
Calcium: 9.1 mg/dL (ref 8.4–10.5)
Chloride: 100 mEq/L (ref 96–112)
Creat: 1.04 mg/dL (ref 0.50–1.35)
GFR, Est African American: >89 mL/min
GFR, Est Non African American: 80 mL/min
Glucose, Bld: 82 mg/dL (ref 70–99)
POTASSIUM: 3.3 meq/L — AB (ref 3.5–5.3)
Sodium: 138 mEq/L (ref 135–145)

## 2015-03-21 LAB — CBC WITH DIFFERENTIAL/PLATELET
BASOS PCT: 0 % (ref 0–1)
Basophils Absolute: 0 10*3/uL (ref 0.0–0.1)
Eosinophils Absolute: 0.1 10*3/uL (ref 0.0–0.7)
Eosinophils Relative: 1 % (ref 0–5)
HCT: 35.7 % — ABNORMAL LOW (ref 39.0–52.0)
Hemoglobin: 12 g/dL — ABNORMAL LOW (ref 13.0–17.0)
LYMPHS PCT: 39 % (ref 12–46)
Lymphs Abs: 3.2 10*3/uL (ref 0.7–4.0)
MCH: 31.5 pg (ref 26.0–34.0)
MCHC: 33.6 g/dL (ref 30.0–36.0)
MCV: 93.7 fL (ref 78.0–100.0)
MONO ABS: 0.9 10*3/uL (ref 0.1–1.0)
MPV: 9.5 fL (ref 8.6–12.4)
Monocytes Relative: 11 % (ref 3–12)
NEUTROS PCT: 49 % (ref 43–77)
Neutro Abs: 4 10*3/uL (ref 1.7–7.7)
PLATELETS: 303 10*3/uL (ref 150–400)
RBC: 3.81 MIL/uL — AB (ref 4.22–5.81)
RDW: 14.3 % (ref 11.5–15.5)
WBC: 8.2 10*3/uL (ref 4.0–10.5)

## 2015-03-21 LAB — LIPID PANEL
CHOLESTEROL: 160 mg/dL (ref 0–200)
HDL: 39 mg/dL — AB (ref >=40)
LDL Cholesterol: 102 mg/dL — ABNORMAL HIGH (ref 0–99)
TRIGLYCERIDES: 97 mg/dL (ref <150)
Total CHOL/HDL Ratio: 4.1 Ratio
VLDL: 19 mg/dL (ref 0–40)

## 2015-03-21 LAB — MAGNESIUM: MAGNESIUM: 2 mg/dL (ref 1.5–2.5)

## 2015-03-21 LAB — HEMOGLOBIN A1C
HEMOGLOBIN A1C: 5.8 % — AB (ref <5.7)
Mean Plasma Glucose: 120 mg/dL — ABNORMAL HIGH (ref <117)

## 2015-03-21 LAB — HEPATIC FUNCTION PANEL
ALBUMIN: 4 g/dL (ref 3.5–5.2)
ALT: 14 U/L (ref 0–53)
AST: 14 U/L (ref 0–37)
Alkaline Phosphatase: 69 U/L (ref 39–117)
BILIRUBIN TOTAL: 0.4 mg/dL (ref 0.2–1.2)
Bilirubin, Direct: 0.1 mg/dL (ref 0.0–0.3)
Indirect Bilirubin: 0.3 mg/dL (ref 0.2–1.2)
Total Protein: 6.7 g/dL (ref 6.0–8.3)

## 2015-03-21 NOTE — Progress Notes (Signed)
Patient ID: Edwin Webster, male   DOB: Nov 03, 1961, 54 y.o.   MRN: [id number]  Assessment and Plan:  Hypertension:  -Continue medication,  -monitor blood pressure at home.  -Continue DASH diet.   -Reminder to go to the ER if any CP, SOB, nausea, dizziness, severe HA, changes vision/speech, left arm numbness and tingling, and jaw pain.  Cholesterol: -Continue diet and exercise.  -Check cholesterol.   Pre-diabetes: -Continue diet and exercise.  -Check A1C  Vitamin D Def: -check level -continue medications.   Continue diet and meds as discussed. Further disposition pending results of labs.  HPI 54 y.o. male  presents for 3 month follow up with hypertension, hyperlipidemia, prediabetes and vitamin D.   His blood pressure has been controlled at home, today their BP is BP: 138/82 mmHg.   He does workout. He denies chest pain, shortness of breath, dizziness.   He is on cholesterol medication and denies myalgias. His cholesterol is at goal. The cholesterol last visit was:   Lab Results  Component Value Date   CHOL 194 09/02/2014   HDL 44 09/02/2014   LDLCALC 134* 09/02/2014   TRIG 78 09/02/2014   CHOLHDL 4.4 09/02/2014     He has been working on diet and exercise for prediabetes, and denies foot ulcerations, hyperglycemia, hypoglycemia , increased appetite, nausea, paresthesia of the feet, polydipsia, polyuria, visual disturbances, vomiting and weight loss. Last A1C in the office was:  Lab Results  Component Value Date   HGBA1C 5.6 09/02/2014    Patient is on Vitamin D supplement.  Lab Results  Component Value Date   VD25OH 38 09/02/2014     He does report that he had his right ring finger fused by Dr. Burney Gauze.    Current Medications:  Current Outpatient Prescriptions on File Prior to Visit  Medication Sig Dispense Refill  . atorvastatin (LIPITOR) 80 MG tablet Take 1/2 to 1 tablet as directed for cholesterol 90 tablet 3  . azithromycin (ZITHROMAX) 250 MG tablet Take 2  tablets (500 mg) on  Day 1,  followed by 1 tablet (250 mg) once daily on Days 2 through 5. 6 each 1  . Cholecalciferol (VITAMIN D-3) 5000 UNITS TABS Take 5,000 Units by mouth daily.    . citalopram (CELEXA) 20 MG tablet TAKE 1 TABLET EVERY DAY 90 tablet 0  . folic acid (FOLVITE) 1 MG tablet Take 1 mg by mouth.    . levocetirizine (XYZAL) 5 MG tablet Take 5 mg by mouth every evening.    . Methotrexate, Anti-Rheumatic, (METHOTREXATE, PF, [employer]) Inject into the skin once a week.    . predniSONE (DELTASONE) 20 MG tablet 2 tablets daily for 3 days, 1 tablet daily for 4 days. 10 tablet 0  . HYDROcodone-acetaminophen (NORCO/VICODIN) 5-325 MG per tablet Take 1 tablet by mouth every 6 (six) hours as needed for moderate pain.     No current facility-administered medications on file prior to visit.    Medical History:  Past Medical History  Diagnosis Date  . Chest pain, non-cardiac   . Hyperlipidemia   . Allergy   . Anxiety   . Arthritis   . Depression   . COPD (chronic obstructive pulmonary disease)   . Gout   . GERD (gastroesophageal reflux disease)   . Diverticulitis     Allergies: No Known Allergies   Review of Systems:  Review of Systems  Constitutional: Negative for fever, chills and malaise/fatigue.  HENT: Negative for congestion, ear pain, nosebleeds and sore throat.  Eyes: Negative.   Respiratory: Negative for cough, shortness of breath and wheezing.   Cardiovascular: Negative for chest pain, palpitations and leg swelling.  Gastrointestinal: Negative for heartburn, nausea, vomiting, abdominal pain, diarrhea, constipation, blood in stool and melena.  Genitourinary: Negative for dysuria, urgency, frequency, hematuria and flank pain.  Musculoskeletal: Negative.   Skin: Negative.   Neurological: Negative.  Negative for headaches.  Psychiatric/Behavioral: Negative.     Family history- Review and unchanged  Social history- Review and unchanged  Physical Exam: BP 138/82 mmHg   Pulse 68  Temp(Src) 98.6 F (37 C) (Temporal)  Resp 18  Ht 5' 9.75" (1.772 m)  Wt 164 lb (74.39 kg)  BMI 23.69 kg/m2 Wt Readings from Last 3 Encounters:  03/21/15 164 lb (74.39 kg)  03/16/15 162 lb (73.483 kg)  09/02/14 179 lb (81.194 kg)    General Appearance: Well nourished well developed, in no apparent distress. Eyes: PERRLA, EOMs, conjunctiva no swelling or erythema ENT/Mouth: Ear canals normal without obstruction, swelling, erythma, discharge.  TMs normal bilaterally.  Oropharynx moist, clear, without exudate, or postoropharyngeal swelling. Neck: Supple, thyroid normal,no cervical adenopathy  Respiratory: Respiratory effort normal, Breath sounds clear A&P without rhonchi, wheeze, or rale.  No retractions, no accessory usage. Cardio: RRR with no MRGs. Brisk peripheral pulses without edema.  Abdomen: Soft, + BS,  Non tender, no guarding, rebound, hernias, masses. Musculoskeletal: Full ROM, 5/5 strength, Normal gait.  Right ring finger without range of motion of the DIP.   Skin: Warm, dry without rashes, lesions, ecchymosis.  Neuro: Awake and oriented X 3, Cranial nerves intact. Normal muscle tone, no cerebellar symptoms. Psych: Normal affect, Insight and Judgment appropriate.    FORCUCCI, Kaydynce Pat, PA-C 3:15 PM [facility]

## 2015-03-21 NOTE — Progress Notes (Signed)
Patient ID: Edwin Webster, male   DOB: 13-Mar-1961, 54 y.o.   MRN: [id number]  Seen by Loma Sousa today

## 2015-03-21 NOTE — Patient Instructions (Signed)
Body Mass Index (BMI) This BMI is not intended for use with those under 56 years of age, or pregnant women, or lactating women. To estimate BMI, locate your height, then find your weight in this listing. Your BMI is located to the right of your weight. Height: 58 inches  Weight: 91 lb = BMI 19 (normal)  Weight: 96 lb = BMI 20 (normal)  Weight: 100 lb = BMI 21 (normal)  Weight: 105 lb = BMI 22 (normal)  Weight: 110 lb = BMI 23 (normal)  Weight: 115 lb = BMI 24 (normal)  Weight: 119 lb = BMI 25 (overweight)  Weight: 124 lb = BMI 26 (overweight)  Weight: 129 lb = BMI 27 (overweight)  Weight: 134 lb = BMI 28 (overweight)  Weight: 138 lb = BMI 29 (overweight)  Weight: 143 lb = BMI 30 (obese)  Weight: 148 lb = BMI 31 (obese)  Weight: 153 lb = BMI 32 (obese)  Weight: 158 lb = BMI 33 (obese)  Weight: 162 lb = BMI 34 (obese)  Weight: 167 lb = BMI 35 (obese)  Weight: 172 lb = BMI 36 (obese)  Weight: 177 lb = BMI 37 (obese)  Weight: 181 lb = BMI 38 (obese)  Weight: 186 lb = BMI 39 (obese)  Weight: 191 lb = BMI 40 (extreme obesity)  Weight: 196 lb = BMI 41 (extreme obesity)  Weight: 201 lb = BMI 42 (extreme obesity)  Weight: 205 lb = BMI 43 (extreme obesity)  Weight: 210 lb = BMI 44 (extreme obesity)  Weight: 215 lb = BMI 45 (extreme obesity)  Weight: 220 lb = BMI 46 (extreme obesity)  Weight: 224 lb = BMI 47 (extreme obesity)  Weight: 229 lb = BMI 48 (extreme obesity)  Weight: 234 lb = BMI 49 (extreme obesity)  Weight: 239 lb = BMI 50 (extreme obesity)  Weight: 244 lb = BMI 51 (extreme obesity)  Weight: 248 lb = BMI 52 (extreme obesity)  Weight: 253 lb = BMI 53 (extreme obesity)  Weight: 258 lb = BMI 54 (extreme obesity) Height: 59 inches  Weight: 94 lb = BMI 19 (normal)  Weight: 99 lb = BMI 20 (normal)  Weight: 104 lb = BMI 21 (normal)  Weight: 109 lb = BMI 22 (normal)  Weight: 114 lb = BMI 23 (normal)  Weight: 119 lb = BMI 24  (normal)  Weight: 124 lb = BMI 25 (overweight)  Weight: 128 lb = BMI 26 (overweight)  Weight: 133 lb = BMI 27 (overweight)  Weight: 138 lb = BMI 28 (overweight)  Weight: 143 lb = BMI 29 (overweight)  Weight: 148 lb = BMI 30 (obese)  Weight: 153 lb = BMI 31 (obese)  Weight: 158 lb = BMI 32 (obese)  Weight: 163 lb = BMI 33 (obese)  Weight: 168 lb = BMI 34 (obese)  Weight: 173 lb = BMI 35 (obese)  Weight: 178 lb = BMI 36 (obese)  Weight: 183 lb = BMI 37 (obese)  Weight: 188 lb = BMI 38 (obese)  Weight: 193 lb = BMI 39 (obese)  Weight: 198 lb = BMI 40 (extreme obesity)  Weight: 203 lb = BMI 41 (extreme obesity)  Weight: 208 lb = BMI 42 (extreme obesity)  Weight: 212 lb = BMI 43 (extreme obesity)  Weight: 217 lb = BMI 44 (extreme obesity)  Weight: 222 lb = BMI 45 (extreme obesity)  Weight: 227 lb = BMI 46 (extreme obesity)  Weight: 232 lb = BMI 47 (extreme obesity)  Weight: 237   lb = BMI 48 (extreme obesity)  Weight: 242 lb = BMI 49 (extreme obesity)  Weight: 247 lb = BMI 50 (extreme obesity)  Weight: 252 lb = BMI 51 (extreme obesity)  Weight: 257 lb = BMI 52 (extreme obesity)  Weight: 262 lb = BMI 53 (extreme obesity)  Weight: 267 lb = BMI 54 (extreme obesity) Height: 60 inches  Weight: 97 lb = BMI 19 (normal)  Weight: 102 lb = BMI 20 (normal)  Weight: 107 lb = BMI 21 (normal)  Weight: 112 lb = BMI 22 (normal)  Weight: 118 lb = BMI 23 (normal)  Weight: 123 lb = BMI 24 (normal)  Weight: 128 lb = BMI 25 (overweight)  Weight: 133 lb = BMI 26 (overweight)  Weight: 138 lb = BMI 27 (overweight)  Weight: 143 lb = BMI 28 (overweight)  Weight: 148 lb = BMI 29 (overweight)  Weight: 153 lb = BMI 30 (obese)  Weight: 158 lb = BMI 31 (obese)  Weight: 163 lb = BMI 32 (obese)  Weight: 168 lb = BMI 33 (obese)  Weight: 174 lb = BMI 34 (obese)  Weight: 179 lb = BMI 35 (obese)  Weight: 184 lb = BMI 36 (obese)  Weight: 189 lb = BMI 37  (obese)  Weight: 194 lb = BMI 38 (obese)  Weight: 199 lb = BMI 39 (obese)  Weight: 204 lb = BMI 40 (extreme obesity)  Weight: 209 lb = BMI 41 (extreme obesity)  Weight: 215 lb = BMI 42 (extreme obesity)  Weight: 220 lb = BMI 43 (extreme obesity)  Weight: 225 lb = BMI 44 (extreme obesity)  Weight: 230 lb = BMI 45 (extreme obesity)  Weight: 235 lb = BMI 46 (extreme obesity)  Weight: 240 lb = BMI 47 (extreme obesity)  Weight: 245 lb = BMI 48 (extreme obesity)  Weight: 250 lb = BMI 49 (extreme obesity)  Weight: 255 lb = BMI 50 (extreme obesity)  Weight: 261 lb = BMI 51 (extreme obesity)  Weight: 266 lb = BMI 52 (extreme obesity)  Weight: 271 lb = BMI 53 (extreme obesity)  Weight: 276 lb = BMI 54 (extreme obesity) Height: 61 inches  Weight: 100 lb = BMI 19 (normal)  Weight: 106 lb = BMI 20 (normal)  Weight: 111 lb = BMI 21 (normal)  Weight: 116 lb = BMI 22 (normal)  Weight: 122 lb = BMI 23 (normal)  Weight: 127 lb = BMI 24 (normal)  Weight: 132 lb = BMI 25 (overweight)  Weight: 137 lb = BMI 26 (overweight)  Weight: 143 lb = BMI 27 (overweight)  Weight: 148 lb = BMI 28 (overweight)  Weight: 153 lb = BMI 29 (overweight)  Weight: 158 lb = BMI 30 (obese)  Weight: 164 lb = BMI 31 (obese)  Weight: 169 lb = BMI 32 (obese)  Weight: 174 lb = BMI 33 (obese)  Weight: 180 lb = BMI 34 (obese)  Weight: 185 lb = BMI 35 (obese)  Weight: 190 lb = BMI 36 (obese)  Weight: 195 lb = BMI 37 (obese)  Weight: 201 lb = BMI 38 (obese)  Weight: 206 lb = BMI 39 (obese)  Weight: 211 lb = BMI 40 (extreme obesity)  Weight: 217 lb = BMI 41 (extreme obesity)  Weight: 222 lb = BMI 42 (extreme obesity)  Weight: 227 lb = BMI 43 (extreme obesity)  Weight: 232 lb = BMI 44 (extreme obesity)  Weight: 238 lb = BMI 45 (extreme obesity)  Weight: 243 lb = BMI 46 (extreme   obesity)  Weight: 248 lb = BMI 47 (extreme obesity)  Weight: 254 lb = BMI 48 (extreme  obesity)  Weight: 259 lb = BMI 49 (extreme obesity)  Weight: 264 lb = BMI 50 (extreme obesity)  Weight: 269 lb = BMI 51 (extreme obesity)  Weight: 275 lb = BMI 52 (extreme obesity)  Weight: 280 lb = BMI 53 (extreme obesity)  Weight: 285 lb = BMI 54 (extreme obesity) Height: 62 inches  Weight: 104 lb = BMI 19 (normal)  Weight: 109 lb = BMI 20 (normal)  Weight: 115 lb = BMI 21 (normal)  Weight: 120 lb = BMI 22 (normal)  Weight: 126 lb = BMI 23 (normal)  Weight: 131 lb = BMI 24 (normal)  Weight: 136 lb = BMI 25 (overweight)  Weight: 142 lb = BMI 26 (overweight)  Weight: 147 lb = BMI 27 (overweight)  Weight: 153 lb = BMI 28 (overweight)  Weight: 158 lb = BMI 29 (overweight)  Weight: 164 lb = BMI 30 (obese)  Weight: 169 lb = BMI 31 (obese)  Weight: 175 lb = BMI 32 (obese)  Weight: 180 lb = BMI 33 (obese)  Weight: 186 lb = BMI 34 (obese)  Weight: 191 lb = BMI 35 (obese)  Weight: 196 lb = BMI 36 (obese)  Weight: 202 lb = BMI 37 (obese)  Weight: 207 lb = BMI 38 (obese)  Weight: 213 lb = BMI 39 (obese)  Weight: 218 lb = BMI 40 (extreme obesity)  Weight: 224 lb = BMI 41 (extreme obesity)  Weight: 229 lb = BMI 42 (extreme obesity)  Weight: 235 lb = BMI 43 (extreme obesity)  Weight: 240 lb = BMI 44 (extreme obesity)  Weight: 246 lb = BMI 45 (extreme obesity)  Weight: 251 lb = BMI 46 (extreme obesity)  Weight: 256 lb = BMI 47 (extreme obesity)  Weight: 262 lb = BMI 48 (extreme obesity)  Weight: 267 lb = BMI 49 (extreme obesity)  Weight: 273 lb = BMI 50 (extreme obesity)  Weight: 278 lb = BMI 51 (extreme obesity)  Weight: 284 lb = BMI 52 (extreme obesity)  Weight: 289 lb = BMI 53 (extreme obesity)  Weight: 295 lb = BMI 54 (extreme obesity) Height: 63 inches  Weight: 107 lb = BMI 19 (normal)  Weight: 113 lb = BMI 20 (normal)  Weight: 118 lb = BMI 21 (normal)  Weight: 124 lb = BMI 22 (normal)  Weight: 130 lb = BMI 23 (normal)  Weight:  135 lb = BMI 24 (normal)  Weight: 141 lb = BMI 25 (overweight)  Weight: 146 lb = BMI 26 (overweight)  Weight: 152 lb = BMI 27 (overweight)  Weight: 158 lb = BMI 28 (overweight)  Weight: 163 lb = BMI 29 (overweight)  Weight: 169 lb = BMI 30 (obese)  Weight: 175 lb = BMI 31 (obese)  Weight: 180 lb = BMI 32 (obese)  Weight: 186 lb = BMI 33 (obese)  Weight: 191 lb = BMI 34 (obese)  Weight: 197 lb = BMI 35 (obese)  Weight: 203 lb = BMI 36 (obese)  Weight: 208 lb = BMI 37 (obese)  Weight: 214 lb = BMI 38 (obese)  Weight: 220 lb = BMI 39 (obese)  Weight: 225 lb = BMI 40 (extreme obesity)  Weight: 231 lb = BMI 41 (extreme obesity)  Weight: 237 lb = BMI 42 (extreme obesity)  Weight: 242 lb = BMI 43 (extreme obesity)  Weight: 248 lb = BMI 44 (extreme obesity)  Weight: 254   lb = BMI 45 (extreme obesity)  Weight: 259 lb = BMI 46 (extreme obesity)  Weight: 265 lb = BMI 47 (extreme obesity)  Weight: 270 lb = BMI 48 (extreme obesity)  Weight: 278 lb = BMI 49 (extreme obesity)  Weight: 282 lb = BMI 50 (extreme obesity)  Weight: 287 lb = BMI 51 (extreme obesity)  Weight: 293 lb = BMI 52 (extreme obesity)  Weight: 299 lb = BMI 53 (extreme obesity)  Weight: 304 lb = BMI 54 (extreme obesity) Height: 64 inches  Weight: 110 lb = BMI 19 (normal)  Weight: 116 lb = BMI 20 (normal)  Weight: 122 lb = BMI 21 (normal)  Weight: 128 lb = BMI 22 (normal)  Weight: 134 lb = BMI 23 (normal)  Weight: 140 lb = BMI 24 (normal)  Weight: 145 lb = BMI 25 (overweight)  Weight: 151 lb = BMI 26 (overweight)  Weight: 157 lb = BMI 27 (overweight)  Weight: 163 lb = BMI 28 (overweight)  Weight: 169 lb = BMI 29 (overweight)  Weight: 174 lb = BMI 30 (obese)  Weight: 180 lb = BMI 31 (obese)  Weight: 186 lb = BMI 32 (obese)  Weight: 192 lb = BMI 33 (obese)  Weight: 197 lb = BMI 34 (obese)  Weight: 204 lb = BMI 35 (obese)  Weight: 209 lb = BMI 36 (obese)  Weight: 215 lb =  BMI 37 (obese)  Weight: 221 lb = BMI 38 (obese)  Weight: 227 lb = BMI 39 (obese)  Weight: 232 lb = BMI 40 (extreme obesity)  Weight: 238 lb = BMI 41 (extreme obesity)  Weight: 244 lb = BMI 42 (extreme obesity)  Weight: 250 lb = BMI 43 (extreme obesity)  Weight: 256 lb = BMI 44 (extreme obesity)  Weight: 262 lb = BMI 45 (extreme obesity)  Weight: 267 lb = BMI 46 (extreme obesity)  Weight: 273 lb = BMI 47 (extreme obesity)  Weight: 279 lb = BMI 48 (extreme obesity)  Weight: 285 lb = BMI 49 (extreme obesity)  Weight: 291 lb = BMI 50 (extreme obesity)  Weight: 296 lb = BMI 51 (extreme obesity)  Weight: 302 lb = BMI 52 (extreme obesity)  Weight: 308 lb = BMI 53 (extreme obesity)  Weight: 314 lb = BMI 54 (extreme obesity) Height: 65 inches  Weight: 114 lb = BMI 19 (normal)  Weight: 120 lb = BMI 20 (normal)  Weight: 126 lb = BMI 21 (normal)  Weight: 132 lb = BMI 22 (normal)  Weight: 138 lb = BMI 23 (normal)  Weight: 144 lb = BMI 24 (normal)  Weight: 150 lb = BMI 25 (overweight)  Weight: 156 lb = BMI 26 (overweight)  Weight: 162 lb = BMI 27 (overweight)  Weight: 168 lb = BMI 28 (overweight)  Weight: 174 lb = BMI 29 (overweight)  Weight: 180 lb = BMI 30 (obese)  Weight: 186 lb = BMI 31 (obese)  Weight: 192 lb = BMI 32 (obese)  Weight: 198 lb = BMI 33 (obese)  Weight: 204 lb = BMI 34 (obese)  Weight: 210 lb = BMI 35 (obese)  Weight: 216 lb = BMI 36 (obese)  Weight: 222 lb = BMI 37 (obese)  Weight: 228 lb = BMI 38 (obese)  Weight: 234 lb = BMI 39 (obese)  Weight: 240 lb = BMI 40 (extreme obesity)  Weight: 246 lb = BMI 41 (extreme obesity)  Weight: 252 lb = BMI 42 (extreme obesity)  Weight: 258 lb = BMI 43 (extreme   obesity)  Weight: 264 lb = BMI 44 (extreme obesity)  Weight: 270 lb = BMI 45 (extreme obesity)  Weight: 276 lb = BMI 46 (extreme obesity)  Weight: 282 lb = BMI 47 (extreme obesity)  Weight: 288 lb = BMI 48 (extreme  obesity)  Weight: 294 lb = BMI 49 (extreme obesity)  Weight: 300 lb = BMI 50 (extreme obesity)  Weight: 306 lb = BMI 51 (extreme obesity)  Weight: 312 lb = BMI 52 (extreme obesity)  Weight: 318 lb = BMI 53 (extreme obesity)  Weight: 324 lb = BMI 54 (extreme obesity) Height: 66 inches  Weight: 118 lb = BMI 19 (normal)  Weight: 124 lb = BMI 20 (normal)  Weight: 130 lb = BMI 21 (normal)  Weight: 136 lb = BMI 22 (normal)  Weight: 142 lb = BMI 23 (normal)  Weight: 148 lb = BMI 24 (normal)  Weight: 155 lb = BMI 25 (overweight)  Weight: 161 lb = BMI 26 (overweight)  Weight: 167 lb = BMI 27 (overweight)  Weight: 173 lb = BMI 28 (overweight)  Weight: 179 lb = BMI 29 (overweight)  Weight: 186 lb = BMI 30 (obese)  Weight: 192 lb = BMI 31 (obese)  Weight: 198 lb = BMI 32 (obese)  Weight: 204 lb = BMI 33 (obese)  Weight: 210 lb = BMI 34 (obese)  Weight: 216 lb = BMI 35 (obese)  Weight: 223 lb = BMI 36 (obese)  Weight: 229 lb = BMI 37 (obese)  Weight: 235 lb = BMI 38 (obese)  Weight: 241 lb = BMI 39 (obese)  Weight: 247 lb = BMI 40 (extreme obesity)  Weight: 253 lb = BMI 41 (extreme obesity)  Weight: 260 lb = BMI 42 (extreme obesity)  Weight: 266 lb = BMI 43 (extreme obesity)  Weight: 272 lb = BMI 44 (extreme obesity)  Weight: 278 lb = BMI 45 (extreme obesity)  Weight: 284 lb = BMI 46 (extreme obesity)  Weight: 291 lb = BMI 47 (extreme obesity)  Weight: 297 lb = BMI 48 (extreme obesity)  Weight: 303 lb = BMI 49 (extreme obesity)  Weight: 309 lb = BMI 50 (extreme obesity)  Weight: 315 lb = BMI 51 (extreme obesity)  Weight: 322 lb = BMI 52 (extreme obesity)  Weight: 328 lb = BMI 53 (extreme obesity)  Weight: 334 lb = BMI 54 (extreme obesity) Height: 67 inches  Weight: 121 lb = BMI 19 (normal)  Weight: 127 lb = BMI 20 (normal)  Weight: 134 lb = BMI 21 (normal)  Weight: 140 lb = BMI 22 (normal)  Weight: 146 lb = BMI 23 (normal)  Weight:  153 lb = BMI 24 (normal)  Weight: 159 lb = BMI 25 (overweight)  Weight: 166 lb = BMI 26 (overweight)  Weight: 172 lb = BMI 27 (overweight)  Weight: 178 lb = BMI 28 (overweight)  Weight: 185 lb = BMI 29 (overweight)  Weight: 191 lb = BMI 30 (obese)  Weight: 198 lb = BMI 31 (obese)  Weight: 204 lb = BMI 32 (obese)  Weight: 211 lb = BMI 33 (obese)  Weight: 217 lb = BMI 34 (obese)  Weight: 223 lb = BMI 35 (obese)  Weight: 230 lb = BMI 36 (obese)  Weight: 236 lb = BMI 37 (obese)  Weight: 242 lb = BMI 38 (obese)  Weight: 249 lb = BMI 39 (obese)  Weight: 255 lb = BMI 40 (extreme obesity)  Weight: 261 lb = BMI 41 (extreme obesity)  Weight: 268   lb = BMI 42 (extreme obesity)  Weight: 274 lb = BMI 43 (extreme obesity)  Weight: 280 lb = BMI 44 (extreme obesity)  Weight: 287 lb = BMI 45 (extreme obesity)  Weight: 293 lb = BMI 46 (extreme obesity)  Weight: 299 lb = BMI 47 (extreme obesity)  Weight: 306 lb = BMI 48 (extreme obesity)  Weight: 312 lb = BMI 49 (extreme obesity)  Weight: 319 lb = BMI 50 (extreme obesity)  Weight: 325 lb = BMI 51 (extreme obesity)  Weight: 331 lb = BMI 52 (extreme obesity)  Weight: 338 lb = BMI 53 (extreme obesity)  Weight: 344 lb = BMI 54 (extreme obesity) Height: 68 inches  Weight: 125 lb = BMI 19 (normal)  Weight: 131 lb = BMI 20 (normal)  Weight: 138 lb = BMI 21 (normal)  Weight: 144 lb = BMI 22 (normal)  Weight: 151 lb = BMI 23 (normal)  Weight: 158 lb = BMI 24 (normal)  Weight: 164 lb = BMI 25 (overweight)  Weight: 171 lb = BMI 26 (overweight)  Weight: 177 lb = BMI 27 (overweight)  Weight: 184 lb = BMI 28 (overweight)  Weight: 190 lb = BMI 29 (overweight)  Weight: 197 lb = BMI 30 (obese)  Weight: 203 lb = BMI 31 (obese)  Weight: 210 lb = BMI 32 (obese)  Weight: 216 lb = BMI 33 (obese)  Weight: 223 lb = BMI 34 (obese)  Weight: 230 lb = BMI 35 (obese)  Weight: 236 lb = BMI 36 (obese)  Weight: 243 lb =  BMI 37 (obese)  Weight: 249 lb = BMI 38 (obese)  Weight: 256 lb = BMI 39 (obese)  Weight: 262 lb = BMI 40 (extreme obesity)  Weight: 269 lb = BMI 41 (extreme obesity)  Weight: 276 lb = BMI 42 (extreme obesity)  Weight: 282 lb = BMI 43 (extreme obesity)  Weight: 289 lb = BMI 44 (extreme obesity)  Weight: 295 lb = BMI 45 (extreme obesity)  Weight: 302 lb = BMI 46 (extreme obesity)  Weight: 308 lb = BMI 47 (extreme obesity)  Weight: 315 lb = BMI 48 (extreme obesity)  Weight: 322 lb = BMI 49 (extreme obesity)  Weight: 328 lb = BMI 50 (extreme obesity)  Weight: 335 lb = BMI 51 (extreme obesity)  Weight: 341 lb = BMI 52 (extreme obesity)  Weight: 348 lb = BMI 53 (extreme obesity)  Weight: 354 lb = BMI 54 (extreme obesity) Height: 69 inches  Weight: 128 lb = BMI 19 (normal)  Weight: 135 lb = BMI 20 (normal)  Weight: 142 lb = BMI 21 (normal)  Weight: 149 lb = BMI 22 (normal)  Weight: 155 lb = BMI 23 (normal)  Weight: 162 lb = BMI 24 (normal)  Weight: 169 lb = BMI 25 (overweight)  Weight: 176 lb = BMI 26 (overweight)  Weight: 182 lb = BMI 27 (overweight)  Weight: 189 lb = BMI 28 (overweight)  Weight: 196 lb = BMI 29 (overweight)  Weight: 203 lb = BMI 30 (obese)  Weight: 209 lb = BMI 31 (obese)  Weight: 216 lb = BMI 32 (obese)  Weight: 223 lb = BMI 33 (obese)  Weight: 230 lb = BMI 34 (obese)  Weight: 236 lb = BMI 35 (obese)  Weight: 243 lb = BMI 36 (obese)  Weight: 250 lb = BMI 37 (obese)  Weight: 257 lb = BMI 38 (obese)  Weight: 263 lb = BMI 39 (obese)  Weight: 270 lb = BMI 40 (extreme   obesity)  Weight: 277 lb = BMI 41 (extreme obesity)  Weight: 284 lb = BMI 42 (extreme obesity)  Weight: 291 lb = BMI 43 (extreme obesity)  Weight: 297 lb = BMI 44 (extreme obesity)  Weight: 304 lb = BMI 45 (extreme obesity)  Weight: 311 lb = BMI 46 (extreme obesity)  Weight: 318 lb = BMI 47 (extreme obesity)  Weight: 324 lb = BMI 48 (extreme  obesity)  Weight: 331 lb = BMI 49 (extreme obesity)  Weight: 338 lb = BMI 50 (extreme obesity)  Weight: 345 lb = BMI 51 (extreme obesity)  Weight: 351 lb = BMI 52 (extreme obesity)  Weight: 358 lb = BMI 53 (extreme obesity)  Weight: 365 lb = BMI 54 (extreme obesity) Height: 70 inches  Weight: 132 lb = BMI 19 (normal)  Weight: 139 lb = BMI 20 (normal)  Weight: 146 lb = BMI 21 (normal)  Weight: 153 lb = BMI 22 (normal)  Weight: 160 lb = BMI 23 (normal)  Weight: 167 lb = BMI 24 (normal)  Weight: 174 lb = BMI 25 (overweight)  Weight: 181 lb = BMI 26 (overweight)  Weight: 188 lb = BMI 27 (overweight)  Weight: 195 lb = BMI 28 (overweight)  Weight: 202 lb = BMI 29 (overweight)  Weight: 209 lb = BMI 30 (obese)  Weight: 216 lb = BMI 31 (obese)  Weight: 222 lb = BMI 32 (obese)  Weight: 229 lb = BMI 33 (obese)  Weight: 236 lb = BMI 34 (obese)  Weight: 243 lb = BMI 35 (obese)  Weight: 250 lb = BMI 36 (obese)  Weight: 257 lb = BMI 37 (obese)  Weight: 264 lb = BMI 38 (obese)  Weight: 271 lb = BMI 39 (obese)  Weight: 278 lb = BMI 40 (extreme obesity)  Weight: 285 lb = BMI 41 (extreme obesity)  Weight: 292 lb = BMI 42 (extreme obesity)  Weight: 299 lb = BMI 43 (extreme obesity)  Weight: 306 lb = BMI 44 (extreme obesity)  Weight: 313 lb = BMI 45 (extreme obesity)  Weight: 320 lb = BMI 46 (extreme obesity)  Weight: 327 lb = BMI 47 (extreme obesity)  Weight: 334 lb = BMI 48 (extreme obesity)  Weight: 341 lb = BMI 49 (extreme obesity)  Weight: 348 lb = BMI 50 (extreme obesity)  Weight: 355 lb = BMI 51 (extreme obesity)  Weight: 362 lb = BMI 52 (extreme obesity)  Weight: 369 lb = BMI 53 (extreme obesity)  Weight: 376 lb = BMI 54 (extreme obesity) Height: 71 inches  Weight: 136 lb = BMI 19 (normal)  Weight: 143 lb = BMI 20 (normal)  Weight: 150 lb = BMI 21 (normal)  Weight: 157 lb = BMI 22 (normal)  Weight: 165 lb = BMI 23 (normal)  Weight:  172 lb = BMI 24 (normal)  Weight: 179 lb = BMI 25 (overweight)  Weight: 186 lb = BMI 26 (overweight)  Weight: 193 lb = BMI 27 (overweight)  Weight: 200 lb = BMI 28 (overweight)  Weight: 208 lb = BMI 29 (overweight)  Weight: 215 lb = BMI 30 (obese)  Weight: 222 lb = BMI 31 (obese)  Weight: 229 lb = BMI 32 (obese)  Weight: 236 lb = BMI 33 (obese)  Weight: 243 lb = BMI 34 (obese)  Weight: 250 lb = BMI 35 (obese)  Weight: 257 lb = BMI 36 (obese)  Weight: 265 lb = BMI 37 (obese)  Weight: 272 lb = BMI 38 (obese)  Weight:   279 lb = BMI 39 (obese)  Weight: 286 lb = BMI 40 (extreme obesity)  Weight: 293 lb = BMI 41 (extreme obesity)  Weight: 301 lb = BMI 42 (extreme obesity)  Weight: 308 lb = BMI 43 (extreme obesity)  Weight: 315 lb = BMI 44 (extreme obesity)  Weight: 322 lb = BMI 45 (extreme obesity)  Weight: 329 lb = BMI 46 (extreme obesity)  Weight: 338 lb = BMI 47 (extreme obesity)  Weight: 343 lb = BMI 48 (extreme obesity)  Weight: 351 lb = BMI 49 (extreme obesity)  Weight: 358 lb = BMI 50 (extreme obesity)  Weight: 365 lb = BMI 51 (extreme obesity)  Weight: 372 lb = BMI 52 (extreme obesity)  Weight: 379 lb = BMI 53 (extreme obesity)  Weight: 386 lb = BMI 54 (extreme obesity) Height: 72 inches  Weight: 140 lb = BMI 19 (normal)  Weight: 147 lb = BMI 20 (normal)  Weight: 154 lb = BMI 21 (normal)  Weight: 162 lb = BMI 22 (normal)  Weight: 169 lb = BMI 23 (normal)  Weight: 177 lb = BMI 24 (normal)  Weight: 184 lb = BMI 25 (overweight)  Weight: 191 lb = BMI 26 (overweight)  Weight: 199 lb = BMI 27 (overweight)  Weight: 206 lb = BMI 28 (overweight)  Weight: 213 lb = BMI 29 (overweight)  Weight: 221 lb = BMI 30 (obese)  Weight: 228 lb = BMI 31 (obese)  Weight: 235 lb = BMI 32 (obese)  Weight: 242 lb = BMI 33 (obese)  Weight: 250 lb = BMI 34 (obese)  Weight: 258 lb = BMI 35 (obese)  Weight: 265 lb = BMI 36 (obese)  Weight: 272 lb =  BMI 37 (obese)  Weight: 279 lb = BMI 38 (obese)  Weight: 287 lb = BMI 39 (obese)  Weight: 294 lb = BMI 40 (extreme obesity)  Weight: 302 lb = BMI 41 (extreme obesity)  Weight: 309 lb = BMI 42 (extreme obesity)  Weight: 316 lb = BMI 43 (extreme obesity)  Weight: 324 lb = BMI 44 (extreme obesity)  Weight: 331 lb = BMI 45 (extreme obesity)  Weight: 338 lb = BMI 46 (extreme obesity)  Weight: 346 lb = BMI 47 (extreme obesity)  Weight: 353 lb = BMI 48 (extreme obesity)  Weight: 361 lb = BMI 49 (extreme obesity)  Weight: 368 lb = BMI 50 (extreme obesity)  Weight: 375 lb = BMI 51 (extreme obesity)  Weight: 383 lb = BMI 52 (extreme obesity)  Weight: 390 lb = BMI 53 (extreme obesity)  Weight: 397 lb = BMI 54 (extreme obesity) Height: 73 inches  Weight: 144 lb = BMI 19 (normal)  Weight: 151 lb = BMI 20 (normal)  Weight: 159 lb = BMI 21 (normal)  Weight: 166 lb = BMI 22 (normal)  Weight: 174 lb = BMI 23 (normal)  Weight: 182 lb = BMI 24 (normal)  Weight: 189 lb = BMI 25 (overweight)  Weight: 197 lb = BMI 26 (overweight)  Weight: 204 lb = BMI 27 (overweight)  Weight: 212 lb = BMI 28 (overweight)  Weight: 219 lb = BMI 29 (overweight)  Weight: 227 lb = BMI 30 (obese)  Weight: 235 lb = BMI 31 (obese)  Weight: 242 lb = BMI 32 (obese)  Weight: 250 lb = BMI 33 (obese)  Weight: 257 lb = BMI 34 (obese)  Weight: 265 lb = BMI 35 (obese)  Weight: 272 lb = BMI 36 (obese)  Weight: 280 lb =   BMI 37 (obese)  Weight: 288 lb = BMI 38 (obese)  Weight: 295 lb = BMI 39 (obese)  Weight: 302 lb = BMI 40 (extreme obesity)  Weight: 310 lb = BMI 41 (extreme obesity)  Weight: 318 lb = BMI 42 (extreme obesity)  Weight: 325 lb = BMI 43 (extreme obesity)  Weight: 333 lb = BMI 44 (extreme obesity)  Weight: 340 lb = BMI 45 (extreme obesity)  Weight: 348 lb = BMI 46 (extreme obesity)  Weight: 355 lb = BMI 47 (extreme obesity)  Weight: 363 lb = BMI 48 (extreme  obesity)  Weight: 371 lb = BMI 49 (extreme obesity)  Weight: 378 lb = BMI 50 (extreme obesity)  Weight: 386 lb = BMI 51 (extreme obesity)  Weight: 393 lb = BMI 52 (extreme obesity)  Weight: 401 lb = BMI 53 (extreme obesity)  Weight: 408 lb = BMI 54 (extreme obesity) Height: 74 inches  Weight: 148 lb = BMI 19 (normal)  Weight: 155 lb = BMI 20 (normal)  Weight: 163 lb = BMI 21 (normal)  Weight: 171 lb = BMI 22 (normal)  Weight: 179 lb = BMI 23 (normal)  Weight: 186 lb = BMI 24 (normal)  Weight: 194 lb = BMI 25 (overweight)  Weight: 202 lb = BMI 26 (overweight)  Weight: 210 lb = BMI 27 (overweight)  Weight: 218 lb = BMI 28 (overweight)  Weight: 225 lb = BMI 29 (overweight)  Weight: 233 lb = BMI 30 (obese)  Weight: 241 lb = BMI 31 (obese)  Weight: 249 lb = BMI 32 (obese)  Weight: 256 lb = BMI 33 (obese)  Weight: 264 lb = BMI 34 (obese)  Weight: 272 lb = BMI 35 (obese)  Weight: 280 lb = BMI 36 (obese)  Weight: 287 lb = BMI 37 (obese)  Weight: 295 lb = BMI 38 (obese)  Weight: 303 lb = BMI 39 (obese)  Weight: 311 lb = BMI 40 (extreme obesity)  Weight: 319 lb = BMI 41 (extreme obesity)  Weight: 326 lb = BMI 42 (extreme obesity)  Weight: 334 lb = BMI 43 (extreme obesity)  Weight: 342 lb = BMI 44 (extreme obesity)  Weight: 350 lb = BMI 45 (extreme obesity)  Weight: 358 lb = BMI 46 (extreme obesity)  Weight: 365 lb = BMI 47 (extreme obesity)  Weight: 373 lb = BMI 48 (extreme obesity)  Weight: 381 lb = BMI 49 (extreme obesity)  Weight: 389 lb = BMI 50 (extreme obesity)  Weight: 396 lb = BMI 51 (extreme obesity)  Weight: 404 lb = BMI 52 (extreme obesity)  Weight: 412 lb = BMI 53 (extreme obesity)  Weight: 420 lb = BMI 54 (extreme obesity) Height: 75 inches  Weight: 152 lb = BMI 19 (normal)  Weight: 160 lb = BMI 20 (normal)  Weight: 168 lb = BMI 21 (normal)  Weight: 176 lb = BMI 22 (normal)  Weight: 184 lb = BMI 23 (normal)  Weight:  192 lb = BMI 24 (normal)  Weight: 200 lb = BMI 25 (overweight)  Weight: 208 lb = BMI 26 (overweight)  Weight: 216 lb = BMI 27 (overweight)  Weight: 224 lb = BMI 28 (overweight)  Weight: 232 lb = BMI 29 (overweight)  Weight: 240 lb = BMI 30 (obese)  Weight: 248 lb = BMI 31 (obese)  Weight: 256 lb = BMI 32 (obese)  Weight: 264 lb = BMI 33 (obese)  Weight: 272 lb = BMI 34 (obese)  Weight: 279 lb = BMI 35 (  obese)  Weight: 287 lb = BMI 36 (obese)  Weight: 295 lb = BMI 37 (obese)  Weight: 303 lb = BMI 38 (obese)  Weight: 311 lb = BMI 39 (obese)  Weight: 319 lb = BMI 40 (extreme obesity)  Weight: 327 lb = BMI 41 (extreme obesity)  Weight: 335 lb = BMI 42 (extreme obesity)  Weight: 343 lb = BMI 43 (extreme obesity)  Weight: 351 lb = BMI 44 (extreme obesity)  Weight: 359 lb = BMI 45 (extreme obesity)  Weight: 367 lb = BMI 46 (extreme obesity)  Weight: 375 lb = BMI 47 (extreme obesity)  Weight: 383 lb = BMI 48 (extreme obesity)  Weight: 391 lb = BMI 49 (extreme obesity)  Weight: 399 lb = BMI 50 (extreme obesity)  Weight: 407 lb = BMI 51 (extreme obesity)  Weight: 415 lb = BMI 52 (extreme obesity)  Weight: 423 lb = BMI 53 (extreme obesity)  Weight: 431 lb = BMI 54 (extreme obesity) Height: 76 inches  Weight: 156 lb = BMI 19 (normal)  Weight: 164 lb = BMI 20 (normal)  Weight: 172 lb = BMI 21 (normal)  Weight: 180 lb = BMI 22 (normal)  Weight: 189 lb = BMI 23 (normal)  Weight: 197 lb = BMI 24 (normal)  Weight: 205 lb = BMI 25 (overweight)  Weight: 213 lb = BMI 26 (overweight)  Weight: 221 lb = BMI 27 (overweight)  Weight: 230 lb = BMI 28 (overweight)  Weight: 238 lb = BMI 29 (overweight)  Weight: 246 lb = BMI 30 (obese)  Weight: 254 lb = BMI 31 (obese)  Weight: 263 lb = BMI 32 (obese)  Weight: 271 lb = BMI 33 (obese)  Weight: 279 lb = BMI 34 (obese)  Weight: 287 lb = BMI 35 (obese)  Weight: 295 lb = BMI 36 (obese)  Weight: 304 lb =  BMI 37 (obese)  Weight: 312 lb = BMI 38 (obese)  Weight: 320 lb = BMI 39 (obese)  Weight: 328 lb = BMI 40 (extreme obesity)  Weight: 336 lb = BMI 41 (extreme obesity)  Weight: 344 lb = BMI 42 (extreme obesity)  Weight: 353 lb = BMI 43 (extreme obesity)  Weight: 361 lb = BMI 44 (extreme obesity)  Weight: 369 lb = BMI 45 (extreme obesity)  Weight: 377 lb = BMI 46 (extreme obesity)  Weight: 385 lb = BMI 47 (extreme obesity)  Weight: 394 lb = BMI 48 (extreme obesity)  Weight: 402 lb = BMI 49 (extreme obesity)  Weight: 410 lb = BMI 50 (extreme obesity)  Weight: 418 lb = BMI 51 (extreme obesity)  Weight: 426 lb = BMI 52 (extreme obesity)  Weight: 435 lb = BMI 53 (extreme obesity)  Weight: 443 lb = BMI 54 (extreme obesity) Source: Adapted from Clinical Guidelines on the Identification, Evaluation, and Treatment of Overweight and Obesity in Adults: The Evidence Report. HEALTH RISK CLASSIFICATION ACCORDING TO BODY MASS INDEX (BMI) Classification: Underweight.  BMI Category:  less than 18.5  Risk of developing health problems: Increased. Classification: Normal Weight.  BMI Category: 18.5 to 24.9  Risk of developing health problems: Least. Classification: Overweight.  BMI Category: 25.0 to 29.9  Risk of developing health problems: Increased. Classification: Obese class.  BMI Category:  30.0 to 34.9  Risk of developing health problems: High. Classification: Obese class II.  BMI Category:  35.0 to 39.9  Risk of developing health problems: Very high. Classification: Obese class III.  BMI Category: 40.0 or more  Risk of   developing health problems: Extremely high. Note: For persons 65 years and older the 'normal' range may begin slightly above BMI 18.5 and extend into the 'overweight' range.   To clarify risk for each individual, other factors also need to be considered, such as:  Lifestyle habits.  Fitness level.  Presence or absence of other health risk  conditions.  The classification system may underestimate or overestimate health risks in certain adults, such as:  Highly muscular adults. Very muscular adults, such as athletes, may have a low percentage of body fat but a large amount of muscle tissue. This can result in a BMI in the overweight range that may over estimate the risk of developing health problems.  Adults who naturally have a very lean body build.  Young adults who have not reached full growth.  Adults over 65 years of age. For adults over age 65, more research is needed to determine if the cut-off points for the 'normal weight' range differ in any way from those for younger adults.  It is also important to note that BMI is only one part of a health risk assessment. To further clarify risk, other factors need to be considered as well.  Age, inherited traits, presence or absence of other conditions such as diabetes, high blood lipids, hypertension, and high blood glucose levels also influence the development of diseases associated with overweight. Risk factors such as poor eating habits, physical inactivity, and tobacco use can play a role in the development of diseases associated with both overweight and underweight. Consult a caregiver for a more complete assessment of your weight as it relates to health risk. It is important to discuss with your caregiver what BMI means for you as an individual. Maintaining a 'normal weight' is one element of good health. However, unhealthy eating habits, low levels of physical activity and tobacco use will increase the risk of health problems even for those within the normal weight range.  Being overweight indicates some risk to health. But research suggests that regular physical activity can decrease the risk of several health problems. Equally, a nutritious diet has been shown to decrease some of the risks associated with overweight. It is important to emphasize that a weight classification  system is but one tool to assess health risks in individuals.  Document Released: 07/24/2004 Document Revised: 02/04/2012 Document Reviewed: 08/22/2005 [employer] Patient Information 2015 [employer]. This information is not intended to replace advice given to you by your health care provider. Make sure you discuss any questions you have with your health care provider.  

## 2015-03-22 LAB — INSULIN, RANDOM: INSULIN: 1.6 u[IU]/mL — AB (ref 2.0–19.6)

## 2015-03-24 LAB — VITAMIN D 1,25 DIHYDROXY
VITAMIN D 1, 25 (OH) TOTAL: 51 pg/mL (ref 18–72)
Vitamin D2 1, 25 (OH)2: <8 pg/mL
Vitamin D3 1, 25 (OH)2: 51 pg/mL

## 2015-06-20 ENCOUNTER — Encounter: Payer: Self-pay | Admitting: Internal Medicine

## 2015-06-20 ENCOUNTER — Encounter (INDEPENDENT_AMBULATORY_CARE_PROVIDER_SITE_OTHER): Payer: Self-pay

## 2015-06-20 ENCOUNTER — Ambulatory Visit (INDEPENDENT_AMBULATORY_CARE_PROVIDER_SITE_OTHER): Payer: 59 | Admitting: Internal Medicine

## 2015-06-20 VITALS — BP 136/82 | HR 68 | Temp 98.4°F | Resp 18 | Ht 69.75 in | Wt 165.0 lb

## 2015-06-20 DIAGNOSIS — F329 Major depressive disorder, single episode, unspecified: Secondary | ICD-10-CM

## 2015-06-20 DIAGNOSIS — R7309 Other abnormal glucose: Secondary | ICD-10-CM

## 2015-06-20 DIAGNOSIS — E559 Vitamin D deficiency, unspecified: Secondary | ICD-10-CM

## 2015-06-20 DIAGNOSIS — R7303 Prediabetes: Secondary | ICD-10-CM

## 2015-06-20 DIAGNOSIS — IMO0001 Reserved for inherently not codable concepts without codable children: Secondary | ICD-10-CM

## 2015-06-20 DIAGNOSIS — Z79899 Other long term (current) drug therapy: Secondary | ICD-10-CM

## 2015-06-20 DIAGNOSIS — E782 Mixed hyperlipidemia: Secondary | ICD-10-CM

## 2015-06-20 DIAGNOSIS — R030 Elevated blood-pressure reading, without diagnosis of hypertension: Secondary | ICD-10-CM

## 2015-06-20 DIAGNOSIS — F32A Depression, unspecified: Secondary | ICD-10-CM

## 2015-06-20 LAB — CBC WITH DIFFERENTIAL/PLATELET
BASOS ABS: 0 10*3/uL (ref 0.0–0.1)
Basophils Relative: 0 % (ref 0–1)
Eosinophils Absolute: 0.1 10*3/uL (ref 0.0–0.7)
Eosinophils Relative: 2 % (ref 0–5)
HEMATOCRIT: 36.4 % — AB (ref 39.0–52.0)
Hemoglobin: 12.3 g/dL — ABNORMAL LOW (ref 13.0–17.0)
LYMPHS ABS: 2.1 10*3/uL (ref 0.7–4.0)
LYMPHS PCT: 38 % (ref 12–46)
MCH: 31.1 pg (ref 26.0–34.0)
MCHC: 33.8 g/dL (ref 30.0–36.0)
MCV: 91.9 fL (ref 78.0–100.0)
MPV: 9.3 fL (ref 8.6–12.4)
Monocytes Absolute: 0.6 10*3/uL (ref 0.1–1.0)
Monocytes Relative: 11 % (ref 3–12)
NEUTROS ABS: 2.7 10*3/uL (ref 1.7–7.7)
Neutrophils Relative %: 49 % (ref 43–77)
PLATELETS: 255 10*3/uL (ref 150–400)
RBC: 3.96 MIL/uL — ABNORMAL LOW (ref 4.22–5.81)
RDW: 14 % (ref 11.5–15.5)
WBC: 5.5 10*3/uL (ref 4.0–10.5)

## 2015-06-20 LAB — BASIC METABOLIC PANEL WITH GFR
BUN: 15 mg/dL (ref 7–25)
CALCIUM: 9.4 mg/dL (ref 8.6–10.3)
CO2: 27 mmol/L (ref 20–31)
Chloride: 102 mmol/L (ref 98–110)
Creat: 1.13 mg/dL (ref 0.70–1.33)
GFR, EST AFRICAN AMERICAN: 84 mL/min (ref >=60)
GFR, Est Non African American: 72 mL/min (ref >=60)
Glucose, Bld: 84 mg/dL (ref 65–99)
Potassium: 3.9 mmol/L (ref 3.5–5.3)
SODIUM: 139 mmol/L (ref 135–146)

## 2015-06-20 LAB — LIPID PANEL
CHOL/HDL RATIO: 3.4 ratio (ref <=5.0)
Cholesterol: 137 mg/dL (ref 125–200)
HDL: 40 mg/dL (ref >=40)
LDL Cholesterol: 84 mg/dL (ref <130)
Triglycerides: 65 mg/dL (ref <150)
VLDL: 13 mg/dL (ref <30)

## 2015-06-20 LAB — HEPATIC FUNCTION PANEL
ALT: 10 U/L (ref 9–46)
AST: 16 U/L (ref 10–35)
Albumin: 4.3 g/dL (ref 3.6–5.1)
Alkaline Phosphatase: 81 U/L (ref 40–115)
BILIRUBIN INDIRECT: 0.3 mg/dL (ref 0.2–1.2)
Bilirubin, Direct: 0.1 mg/dL (ref <=0.2)
Total Bilirubin: 0.4 mg/dL (ref 0.2–1.2)
Total Protein: 6.7 g/dL (ref 6.1–8.1)

## 2015-06-20 LAB — HEMOGLOBIN A1C
Hgb A1c MFr Bld: 5.6 % (ref <5.7)
Mean Plasma Glucose: 114 mg/dL (ref <117)

## 2015-06-20 LAB — MAGNESIUM: Magnesium: 2.1 mg/dL (ref 1.5–2.5)

## 2015-06-20 LAB — TSH: TSH: 1.178 u[IU]/mL (ref 0.350–4.500)

## 2015-06-20 MED ORDER — ESCITALOPRAM OXALATE 10 MG PO TABS: 10.0000 mg | ORAL_TABLET | Freq: Every day | ORAL | Status: DC

## 2015-06-20 NOTE — Patient Instructions (Addendum)
You can start taking tumeric with curcumin or black pepper extract.  This is equivalent to taking ibuprofen or aleve daily.    Please cut your current antidepressant in half for a couple days before stopping the medication.  You can start the new medication once you have taken 2-3 days of a half dose.

## 2015-06-20 NOTE — Progress Notes (Signed)
Assessment and Plan:  Hypertension:  -Continue medication,  -monitor blood pressure at home.  -Continue DASH diet.   -Reminder to go to the ER if any CP, SOB, nausea, dizziness, severe HA, changes vision/speech, left arm numbness and tingling, and jaw pain.  Cholesterol: -Continue diet and exercise.  -Check cholesterol.   Pre-diabetes: -Continue diet and exercise.  -Check A1C  Vitamin D Def: -check level -continue medications.   Depression -d/c celexa -start on lexapro -room to go up if fatigue does not improve  Continue diet and meds as discussed. Further disposition pending results of labs.  HPI 54 y.o. male  presents for 3 month follow up with hypertension, hyperlipidemia, prediabetes and vitamin D.   His blood pressure has been controlled at home, today their BP is BP: 136/82 mmHg.   He does workout. He denies chest pain, shortness of breath, dizziness.   He is on cholesterol medication and denies myalgias. His cholesterol is not at goal. The cholesterol last visit was:   Lab Results  Component Value Date   CHOL 160 03/21/2015   HDL 39* 03/21/2015   LDLCALC 102* 03/21/2015   TRIG 97 03/21/2015   CHOLHDL 4.1 03/21/2015     He has been working on diet and exercise for prediabetes, and denies foot ulcerations, hyperglycemia, hypoglycemia , increased appetite, nausea, paresthesia of the feet, polydipsia, polyuria, visual disturbances, vomiting and weight loss. Last A1C in the office was:  Lab Results  Component Value Date   HGBA1C 5.8* 03/21/2015    Patient is on Vitamin D supplement.  Lab Results  Component Value Date   VD25OH 31 09/02/2014     He reports that his finger has been healing well.  He reports that Dr. Burney Gauze has done a good thing helping.     Current Medications:  Current Outpatient Prescriptions on File Prior to Visit  Medication Sig Dispense Refill  . atorvastatin (LIPITOR) 80 MG tablet Take 1/2 to 1 tablet as directed for cholesterol 90  tablet 3  . Cholecalciferol (VITAMIN D-3) 5000 UNITS TABS Take 5,000 Units by mouth daily.    . folic acid (FOLVITE) 1 MG tablet Take 1 mg by mouth.    . levocetirizine (XYZAL) 5 MG tablet Take 5 mg by mouth every evening.    . Methotrexate, Anti-Rheumatic, (METHOTREXATE, PF, [place]) Inject into the skin once a week.    Marland Kitchen HYDROcodone-acetaminophen (NORCO/VICODIN) 5-325 MG per tablet Take 1 tablet by mouth every 6 (six) hours as needed for moderate pain.     No current facility-administered medications on file prior to visit.    Medical History:  Past Medical History  Diagnosis Date  . Chest pain, non-cardiac   . Hyperlipidemia   . Allergy   . Anxiety   . Arthritis   . Depression   . COPD (chronic obstructive pulmonary disease)   . Gout   . GERD (gastroesophageal reflux disease)   . Diverticulitis     Allergies: No Known Allergies   Review of Systems:  Review of Systems  Constitutional: Positive for malaise/fatigue. Negative for fever and chills.  HENT: Negative for congestion, ear discharge and sore throat.   Eyes: Negative.   Respiratory: Negative for cough, shortness of breath and wheezing.   Cardiovascular: Negative for chest pain, palpitations and leg swelling.  Gastrointestinal: Negative for heartburn, diarrhea, constipation, blood in stool and melena.  Genitourinary: Negative.   Skin: Negative for itching and rash.  Neurological: Negative for dizziness, sensory change and headaches.  Psychiatric/Behavioral: Positive  for depression. The patient is not nervous/anxious and does not have insomnia.     Family history- Review and unchanged  Social history- Review and unchanged  Physical Exam: BP 136/82 mmHg  Pulse 68  Temp(Src) 98.4 F (36.9 C) (Temporal)  Resp 18  Ht 5' 9.75" (1.772 m)  Wt 165 lb (74.844 kg)  BMI 23.84 kg/m2 Wt Readings from Last 3 Encounters:  06/20/15 165 lb (74.844 kg)  03/21/15 164 lb (74.39 kg)  03/16/15 162 lb (73.483 kg)    General  Appearance: Well nourished well developed, in no apparent distress. Eyes: PERRLA, EOMs, conjunctiva no swelling or erythema ENT/Mouth: Ear canals normal without obstruction, swelling, erythma, discharge.  TMs normal bilaterally.  Oropharynx moist, clear, without exudate, or postoropharyngeal swelling. Neck: Supple, thyroid normal,no cervical adenopathy  Respiratory: Respiratory effort normal, Breath sounds clear A&P without rhonchi, wheeze, or rale.  No retractions, no accessory usage. Cardio: RRR with no MRGs. Brisk peripheral pulses without edema.  Abdomen: Soft, + BS,  Non tender, no guarding, rebound, hernias, masses. Musculoskeletal: Full ROM, 5/5 strength, Normal gait Skin: Warm, dry without rashes, lesions, ecchymosis.  Neuro: Awake and oriented X 3, Cranial nerves intact. Normal muscle tone, no cerebellar symptoms. Psych: Normal affect, Insight and Judgment appropriate.    Starlyn Skeans, PA-C 3:47 PM [facility]

## 2015-06-21 LAB — INSULIN, RANDOM: Insulin: 1.1 u[IU]/mL — ABNORMAL LOW (ref 2.0–19.6)

## 2015-06-21 LAB — VITAMIN D 25 HYDROXY (VIT D DEFICIENCY, FRACTURES): VIT D 25 HYDROXY: 38 ng/mL (ref 30–100)

## 2015-07-05 ENCOUNTER — Encounter: Payer: Self-pay | Admitting: Internal Medicine

## 2015-07-24 ENCOUNTER — Encounter: Payer: Self-pay | Admitting: Internal Medicine

## 2015-08-23 ENCOUNTER — Encounter: Payer: Self-pay | Admitting: Internal Medicine

## 2015-09-08 ENCOUNTER — Encounter: Payer: Self-pay | Admitting: Internal Medicine

## 2015-09-21 ENCOUNTER — Encounter: Payer: Self-pay | Admitting: Internal Medicine

## 2015-09-28 ENCOUNTER — Encounter: Payer: Self-pay | Admitting: Internal Medicine

## 2015-09-30 ENCOUNTER — Ambulatory Visit (AMBULATORY_SURGERY_CENTER): Payer: Self-pay | Admitting: *Deleted

## 2015-09-30 VITALS — Ht 72.0 in | Wt 161.0 lb

## 2015-09-30 DIAGNOSIS — Z86010 Personal history of colonic polyps: Secondary | ICD-10-CM

## 2015-09-30 MED ORDER — NA SULFATE-K SULFATE-MG SULF 17.5-3.13-1.6 GM/177ML PO SOLN
1.0000 | Freq: Once | ORAL | Status: DC
Start: 2015-09-30 — End: 2015-10-13

## 2015-09-30 NOTE — Progress Notes (Signed)
No egg or soy allergy. No anesthesia problems.  No home O2.  No diet meds.  

## 2015-10-11 ENCOUNTER — Encounter: Payer: Self-pay | Admitting: Internal Medicine

## 2015-10-11 ENCOUNTER — Ambulatory Visit (INDEPENDENT_AMBULATORY_CARE_PROVIDER_SITE_OTHER): Payer: 59 | Admitting: Internal Medicine

## 2015-10-11 VITALS — BP 130/76 | HR 64 | Temp 97.5°F | Resp 16 | Ht 70.0 in | Wt 161.6 lb

## 2015-10-11 DIAGNOSIS — Z111 Encounter for screening for respiratory tuberculosis: Secondary | ICD-10-CM

## 2015-10-11 DIAGNOSIS — Z0001 Encounter for general adult medical examination with abnormal findings: Secondary | ICD-10-CM

## 2015-10-11 DIAGNOSIS — E782 Mixed hyperlipidemia: Secondary | ICD-10-CM

## 2015-10-11 DIAGNOSIS — Z0000 Encounter for general adult medical examination without abnormal findings: Secondary | ICD-10-CM | POA: Diagnosis not present

## 2015-10-11 DIAGNOSIS — Z125 Encounter for screening for malignant neoplasm of prostate: Secondary | ICD-10-CM

## 2015-10-11 DIAGNOSIS — Z79899 Other long term (current) drug therapy: Secondary | ICD-10-CM

## 2015-10-11 DIAGNOSIS — R030 Elevated blood-pressure reading, without diagnosis of hypertension: Secondary | ICD-10-CM | POA: Diagnosis not present

## 2015-10-11 DIAGNOSIS — R7309 Other abnormal glucose: Secondary | ICD-10-CM

## 2015-10-11 DIAGNOSIS — Z6824 Body mass index (BMI) 24.0-24.9, adult: Secondary | ICD-10-CM | POA: Insufficient documentation

## 2015-10-11 DIAGNOSIS — R5383 Other fatigue: Secondary | ICD-10-CM

## 2015-10-11 DIAGNOSIS — Z6823 Body mass index (BMI) 23.0-23.9, adult: Secondary | ICD-10-CM

## 2015-10-11 DIAGNOSIS — Z1212 Encounter for screening for malignant neoplasm of rectum: Secondary | ICD-10-CM

## 2015-10-11 DIAGNOSIS — R7303 Prediabetes: Secondary | ICD-10-CM

## 2015-10-11 DIAGNOSIS — M0600 Rheumatoid arthritis without rheumatoid factor, unspecified site: Secondary | ICD-10-CM

## 2015-10-11 DIAGNOSIS — E559 Vitamin D deficiency, unspecified: Secondary | ICD-10-CM

## 2015-10-11 DIAGNOSIS — Z23 Encounter for immunization: Secondary | ICD-10-CM

## 2015-10-11 HISTORY — DX: Other abnormal glucose: R73.09

## 2015-10-11 LAB — HEPATIC FUNCTION PANEL
ALBUMIN: 4.4 g/dL (ref 3.6–5.1)
ALT: 12 U/L (ref 9–46)
AST: 19 U/L (ref 10–35)
Alkaline Phosphatase: 66 U/L (ref 40–115)
BILIRUBIN DIRECT: 0.1 mg/dL (ref <=0.2)
Indirect Bilirubin: 0.6 mg/dL (ref 0.2–1.2)
Total Bilirubin: 0.7 mg/dL (ref 0.2–1.2)
Total Protein: 6.9 g/dL (ref 6.1–8.1)

## 2015-10-11 LAB — LIPID PANEL
CHOL/HDL RATIO: 3.9 ratio (ref <=5.0)
Cholesterol: 185 mg/dL (ref 125–200)
HDL: 47 mg/dL (ref >=40)
LDL Cholesterol: 123 mg/dL (ref <130)
Triglycerides: 74 mg/dL (ref <150)
VLDL: 15 mg/dL (ref <30)

## 2015-10-11 LAB — MAGNESIUM: MAGNESIUM: 2.1 mg/dL (ref 1.5–2.5)

## 2015-10-11 LAB — CBC WITH DIFFERENTIAL/PLATELET
BASOS PCT: 1 % (ref 0–1)
Basophils Absolute: 0.1 10*3/uL (ref 0.0–0.1)
EOS ABS: 0.1 10*3/uL (ref 0.0–0.7)
Eosinophils Relative: 1 % (ref 0–5)
HCT: 37.9 % — ABNORMAL LOW (ref 39.0–52.0)
HEMOGLOBIN: 13.4 g/dL (ref 13.0–17.0)
Lymphocytes Relative: 34 % (ref 12–46)
Lymphs Abs: 1.8 10*3/uL (ref 0.7–4.0)
MCH: 32.9 pg (ref 26.0–34.0)
MCHC: 35.4 g/dL (ref 30.0–36.0)
MCV: 93.1 fL (ref 78.0–100.0)
MONO ABS: 0.6 10*3/uL (ref 0.1–1.0)
MONOS PCT: 12 % (ref 3–12)
MPV: 9.5 fL (ref 8.6–12.4)
Neutro Abs: 2.8 10*3/uL (ref 1.7–7.7)
Neutrophils Relative %: 52 % (ref 43–77)
PLATELETS: 288 10*3/uL (ref 150–400)
RBC: 4.07 MIL/uL — ABNORMAL LOW (ref 4.22–5.81)
RDW: 14.6 % (ref 11.5–15.5)
WBC: 5.4 10*3/uL (ref 4.0–10.5)

## 2015-10-11 LAB — IRON AND TIBC
%SAT: 28 % (ref 15–60)
IRON: 93 ug/dL (ref 50–180)
TIBC: 335 ug/dL (ref 250–425)
UIBC: 242 ug/dL (ref 125–400)

## 2015-10-11 LAB — BASIC METABOLIC PANEL WITH GFR
BUN: 14 mg/dL (ref 7–25)
CALCIUM: 9.5 mg/dL (ref 8.6–10.3)
CO2: 28 mmol/L (ref 20–31)
CREATININE: 0.96 mg/dL (ref 0.70–1.33)
Chloride: 101 mmol/L (ref 98–110)
GFR, Est African American: >89 mL/min (ref >=60)
GFR, Est Non African American: 88 mL/min (ref >=60)
Glucose, Bld: 90 mg/dL (ref 65–99)
Potassium: 3.6 mmol/L (ref 3.5–5.3)
SODIUM: 139 mmol/L (ref 135–146)

## 2015-10-11 LAB — URIC ACID: URIC ACID, SERUM: 4.4 mg/dL (ref 4.0–7.8)

## 2015-10-11 NOTE — Progress Notes (Signed)
Patient ID: Edwin Webster, male   DOB: 1961/10/12, 54 y.o.   MRN: [id number]  Annual  Screening/Preventative Visit And  Comprehensive Evaluation & Examination  This very nice 54 y.o.male presents for presents for a Wellness/Preventative Visit & comprehensive evaluation and management of multiple medical co-morbidities.  Patient has been followed for HTN, T2_NIDDM  Prediabetes, Hyperlipidemia, and Vitamin D Deficiency. 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 RA latex & anti-CCP were negative and dx's was based on U/S by dr Ouida Sills and he's currently followed by Dr Amil Amen. He did apparently have an idiosyncratic rxn to Humira.    Patient has labile HTN predates since 2013 and has been monitored expectamtly. Patient's BP has been controlled at home.Today's BP: 130/76 mmHg. Patient denies any cardiac symptoms as chest pain, palpitations, shortness of breath, dizziness or ankle swelling.   Patient's hyperlipidemia is not controlled controlled with diet. Patient denies myalgias or other medication SE's. Last lipids were Cholesterol 185; HDL 47; LDL 123; Triglycerides 74 on 10/11/2015.   Patient has prediabetes with A1c 5.8% in April 2016 and patient denies reactive hypoglycemic symptoms, visual blurring, diabetic polys or paresthesias. Last A1c was  5.6% on 06/20/2015.     Finally, patient has history of Vitamin D Deficiency and last vitamin D was low at 38 on 06/20/2015.   Medication Sig   bASA 81 mg  Takes 1 daily  . atorvastatin  80 MG tablet Take 1/2 to 1 tablet as directed for cholesterol  . VITAMIN D 5000 UNITS TABS Take 5,000 Units by mouth daily.  Marland Kitchen escitalopram  10 MG tablet Take 1 tablet (10 mg total) by mouth daily.  . folic acid  1 MG tablet Take 1 mg by mouth.  Marland Kitchen VICOPROFEN 5-200  Take 1 tablet by mouth every 8 (eight) hours as needed for pain.  Marland Kitchen levocetirizine  5 MG tablet Take 5 mg by mouth every evening.  Marland Kitchen  METHOTREXATE, [place] Inject into the skin once a week.    Allergies  Allergen Reactions  . Humira [Adalimumab] Other (See Comments)    "blacked out", confused   Past Medical History  Diagnosis Date  . Chest pain, non-cardiac   . Hyperlipidemia   . Allergy   . Anxiety   . Depression   . COPD (chronic obstructive pulmonary disease) ([place])   . Gout   . GERD (gastroesophageal reflux disease)   . Diverticulitis   . Anemia   . Arthritis   . Rheumatoid arthritis ([place])     hands  . Kidney stones    Health Maintenance  Topic Date Due  . TETANUS/TDAP  01/27/1978  . INFLUENZA VACCINE  06/27/2015  . COLONOSCOPY  08/09/2015  . Hepatitis C Screening  Completed  . HIV Screening  Completed   Immunization History  Administered Date(s) Administered  . DTaP 06/04/2011  . Hepatitis B 11/26/2010  . Influenza Split 09/02/2014  . Influenza Whole 09/17/2013  . Influenza, Seasonal, Injecte, Preservative Fre 10/11/2015  . PPD Test 10/11/2015   Past Surgical History  Procedure Laterality Date  . Rotator cuff repair  1996    left  . Knee surgery      right  . Colonoscopy    . Finger surgery     Family History  Problem Relation Age of Onset  . Hyperlipidemia Mother   . COPD Mother   . Heart failure Mother   . Cancer Mother     breast  . Hyperlipidemia Brother   .  Colon cancer Maternal Grandfather 95  . Stroke Maternal Grandfather   . COPD Maternal Grandfather     colon  . Esophageal cancer Neg Hx   . Stomach cancer Neg Hx   . Rectal cancer Neg Hx   . Hypertension Father     Social History   Social History  . Marital Status: Married    Spouse Name: N/A  . Number of Children: N/A  . Years of Education: N/A   Occupational History  . Sales   Social History Main Topics  . Smoking status: Former Smoker -- 2.00 packs/day    Types: Cigarettes    Quit date: 07/03/2010  . Smokeless tobacco: Never Used  . Alcohol Use: No  . Drug Use: No  . Sexual Activity: Active    ROS Constitutional: Denies fever, chills, weight  loss/gain, headaches, insomnia,  night sweats or change in appetite. Does c/o fatigue. Eyes: Denies redness, blurred vision, diplopia, discharge, itchy or watery eyes.  ENT: Denies discharge, congestion, post nasal drip, epistaxis, sore throat, earache, hearing loss, dental pain, Tinnitus, Vertigo, Sinus pain or snoring.  Cardio: Denies chest pain, palpitations, irregular heartbeat, syncope, dyspnea, diaphoresis, orthopnea, PND, claudication or edema Respiratory: denies cough, dyspnea, DOE, pleurisy, hoarseness, laryngitis or wheezing.  Gastrointestinal: Denies dysphagia, heartburn, reflux, water brash, pain, cramps, nausea, vomiting, bloating, diarrhea, constipation, hematemesis, melena, hematochezia, jaundice or hemorrhoids Genitourinary: Denies dysuria, frequency, urgency, nocturia, hesitancy, discharge, hematuria or flank pain Musculoskeletal: Denies arthralgia, myalgia, stiffness, Jt. Swelling, pain, limp or strain/sprain. Denies Falls. Skin: Denies puritis, rash, hives, warts, acne, eczema or change in skin lesion Neuro: No weakness, tremor, incoordination, spasms, paresthesia or pain Psychiatric: Denies confusion, memory loss or sensory loss. Denies Depression. Endocrine: Denies change in weight, skin, hair change, nocturia, and paresthesia, diabetic polys, visual blurring or hyper / hypo glycemic episodes.  Heme/Lymph: No excessive bleeding, bruising or enlarged lymph nodes.  Physical Exam  BP 130/76 mmHg  Pulse 64  Temp(Src) 97.5 F (36.4 C)  Resp 16  Ht 5\' 10"  (1.778 m)  Wt 161 lb 9.6 oz (73.301 kg)  BMI 23.19 kg/m2  General Appearance: Well nourished, in no apparent distress. Eyes: PERRLA, EOMs, conjunctiva no swelling or erythema, normal fundi and vessels. Sinuses: No frontal/maxillary tenderness ENT/Mouth: EACs patent / TMs  nl. Nares clear without erythema, swelling, mucoid exudates. Oral hygiene is good. No erythema, swelling, or exudate. Tongue normal, non-obstructing.  Tonsils not swollen or erythematous. Hearing normal.  Neck: Supple, thyroid normal. No bruits, nodes or JVD. Respiratory: Respiratory effort normal.  BS equal and clear bilateral without rales, rhonci, wheezing or stridor. Cardio: Heart sounds are normal with regular rate and rhythm and no murmurs, rubs or gallops. Peripheral pulses are normal and equal bilaterally without edema. No aortic or femoral bruits. Chest: symmetric with normal excursions and percussion.  Abdomen: Flat, soft, with bowl sounds. Nontender, no guarding, rebound, hernias, masses, or organomegaly.  Lymphatics: Non tender without lymphadenopathy.  Genitourinary: No hernias.Testes nl. DRE - prostate nl for age - smooth & firm w/o nodules. Musculoskeletal: Full ROM all peripheral extremities, joint stability, 5/5 strength, and normal gait. Skin: Warm and dry without rashes, lesions, cyanosis, clubbing or  ecchymosis.  Neuro: Cranial nerves intact, reflexes equal bilaterally. Normal muscle tone, no cerebellar symptoms. Sensation intact.  Pysch: Awake and oriented X 3 with normal affect, insight and judgment appropriate.   Assessment and Plan  1. Annual Preventative/Screening Exam   - aspirin EC 81 MG tablet; Take 81 mg by mouth daily. -  Microalbumin / creatinine urine ratio - EKG 12-Lead - [place], RETROPERITNL ABD,  LTD - POC Hemoccult Bld/Stl  - Vitamin B12 - PSA - Testosterone - Iron and TIBC - Uric acid - Urinalysis, Routine w reflex microscopic  - CBC with Differential/Platelet - BASIC METABOLIC PANEL WITH GFR - Hepatic function panel - Magnesium - Lipid panel - TSH - Hemoglobin A1c - Insulin, random - VITAMIN D 25 Hydroxy  - Flu vaccine greater than or equal to 3yo preservative free IM - PPD  2. Elev BP w/o Dx /o HTN  - Microalbumin / creatinine urine ratio - EKG 12-Lead - [place], RETROPERITNL ABD,  LTD - TSH  3. Hyperlipidemia  - Lipid panel - TSH  4. Prediabetes  - Hemoglobin A1c - Insulin,  random  5. Vitamin D deficiency  - VITAMIN D 25 Hydroxy   6. Seronegative rheumatoid arthritis ([place])   7. Screening for rectal cancer  - POC Hemoccult Bld/Stl   8. Prostate cancer screening  - PSA  9. Need for prophylactic vaccination and inoculation against influenza  - Flu vaccine greater than or equal to 3yo preservative free IM  10. Screening examination for pulmonary tuberculosis  - PPD  11. Other fatigue  - Vitamin B12 - Testosterone - Iron and TIBC - TSH  12. Medication management  - Uric acid - Urinalysis, Routine w reflex microscopic  - CBC with Differential/Platelet - BASIC METABOLIC PANEL WITH GFR - Hepatic function panel - Magnesium  13. Body mass index (BMI) of 23.0-23.9 in adult   Continue prudent diet as discussed, weight control, BP monitoring, regular exercise, and medications as discussed.  Discussed med effects and SE's. Routine screening labs and tests as requested with regular follow-up as recommended.

## 2015-10-11 NOTE — Patient Instructions (Signed)
Recommend Adult Low Dose Aspirin or   coated  Aspirin 81 mg daily   To reduce risk of Colon Cancer 20 %,   Skin Cancer 26 % ,   Melanoma 46%   and   Pancreatic cancer 60%   ++++++++++++++++++++++++++++++++++++++++++++++++++++++  Vitamin D goal   is between 70-100.   Please make sure that you are taking your Vitamin D as directed.   It is very important as a natural anti-inflammatory   helping hair, skin, and nails, as well as reducing stroke and heart attack risk.   It helps your bones and helps with mood.  It also decreases numerous cancer risks so please take it as directed.   Low Vit D is associated with a 200-300% higher risk for CANCER   and 200-300% higher risk for HEART   ATTACK  &  STROKE.   ......................................  It is also associated with higher death rate at younger ages,   autoimmune diseases like Rheumatoid arthritis, Lupus, Multiple Sclerosis.     Also many other serious conditions, like depression, Alzheimer's  Dementia, infertility, muscle aches, fatigue, fibromyalgia - just to name a few.  ++++++++++++++++++++++++++++++++++++++++++++++++  Recommend the book "The END of DIETING" by Dr Joel Fuhrman   & the book "The END of DIABETES " by Dr Joel Fuhrman  At [employer] - get book & Audio CD's     Being diabetic has a  300% increased risk for heart attack, stroke, cancer, and alzheimer- type vascular dementia. It is very important that you work harder with diet by avoiding all foods that are white. Avoid white rice (Dorn & wild rice is OK), white potatoes (sweetpotatoes in moderation is OK), White bread or wheat bread or anything made out of white flour like bagels, donuts, rolls, buns, biscuits, cakes, pastries, cookies, pizza crust, and pasta (made from white flour & egg whites) - vegetarian pasta or spinach or wheat pasta is OK. Multigrain breads like Arnold's or Pepperidge Farm, or multigrain sandwich thins or flatbreads.  Diet,  exercise and weight loss can reverse and cure diabetes in the early stages.  Diet, exercise and weight loss is very important in the control and prevention of complications of diabetes which affects every system in your body, ie. Brain - dementia/stroke, eyes - glaucoma/blindness, heart - heart attack/heart failure, kidneys - dialysis, stomach - gastric paralysis, intestines - malabsorption, nerves - severe painful neuritis, circulation - gangrene & loss of a leg(s), and finally cancer and Alzheimers.    I recommend avoid fried & greasy foods,  sweets/candy, white rice (Bujak or wild rice or Quinoa is OK), white potatoes (sweet potatoes are OK) - anything made from white flour - bagels, doughnuts, rolls, buns, biscuits,white and wheat breads, pizza crust and traditional pasta made of white flour & egg white(vegetarian pasta or spinach or wheat pasta is OK).  Multi-grain bread is OK - like multi-grain flat bread or sandwich thins. Avoid alcohol in excess. Exercise is also important.    Eat all the vegetables you want - avoid meat, especially red meat and dairy - especially cheese.  Cheese is the most concentrated form of trans-fats which is the worst thing to clog up our arteries. Veggie cheese is OK which can be found in the fresh produce section at [employer] or [employer] or [employer]  ++++++++++++++++++++++++++++++++++++++++++++++++++ DASH Eating Plan  DASH stands for "Dietary Approaches to Stop Hypertension."   The DASH eating plan is a healthy eating plan that has been shown to reduce high   blood pressure (hypertension). Additional health benefits may include reducing the risk of type 2 diabetes mellitus, heart disease, and stroke. The DASH eating plan may also help with weight loss.  WHAT DO I NEED TO KNOW ABOUT THE DASH EATING PLAN? For the DASH eating plan, you will follow these general guidelines:  Choose foods with a percent daily value for sodium of less than 5% (as listed on the food  label).  Use salt-free seasonings or herbs instead of table salt or sea salt.  Check with your health care provider or pharmacist before using salt substitutes.  Eat lower-sodium products, often labeled as "lower sodium" or "no salt added."  Eat fresh foods.  Eat more vegetables, fruits, and low-fat dairy products.    Choose whole grains. Look for the word "whole" as the first word in the ingredient list.  Choose fish   Limit sweets, desserts, sugars, and sugary drinks.  Choose heart-healthy fats.  Eat veggie cheese   Eat more home-cooked food and less restaurant, buffet, and fast food.  Limit fried foods.  Cook foods using methods other than frying.  Limit canned vegetables. If you do use them, rinse them well to decrease the sodium.  When eating at a restaurant, ask that your food be prepared with less salt, or no salt if possible.                      WHAT FOODS CAN I EAT?  Seek help from a dietitian for individual calorie needs.  Grains Whole grain or whole wheat bread. Guzy rice. Whole grain or whole wheat pasta. Quinoa, bulgur, and whole grain cereals. Low-sodium cereals. Corn or whole wheat flour tortillas. Whole grain cornbread. Whole grain crackers. Low-sodium crackers.  Vegetables Fresh or frozen vegetables (raw, steamed, roasted, or grilled). Low-sodium or reduced-sodium tomato and vegetable juices. Low-sodium or reduced-sodium tomato sauce and paste. Low-sodium or reduced-sodium canned vegetables.   Fruits All fresh, canned (in natural juice), or frozen fruits.  Protein Products  All fish and seafood.  Dried beans, peas, or lentils. Unsalted nuts and seeds. Unsalted canned beans.  Dairy Low-fat dairy products, such as skim or 1% milk, 2% or reduced-fat cheeses, low-fat ricotta or cottage cheese, or plain low-fat yogurt. Low-sodium or reduced-sodium cheeses.  Fats and Oils Tub margarines without trans fats. Light or reduced-fat mayonnaise and salad  dressings (reduced sodium). Avocado. Safflower, olive, or canola oils. Natural peanut or almond butter.  Other Unsalted popcorn and pretzels. The items listed above may not be a complete list of recommended foods or beverages. Contact your dietitian for more options.  +++++++++++++++++++++++++++++++++++++++++++  WHAT FOODS ARE NOT RECOMMENDED?  Grains/ White flour or wheat flour  White bread. White pasta. White rice. Refined cornbread. Bagels and croissants. Crackers that contain trans fat.  Vegetables  Creamed or fried vegetables. Vegetables in a . Regular canned vegetables. Regular canned tomato sauce and paste. Regular tomato and vegetable juices.  Fruits Dried fruits. Canned fruit in light or heavy syrup. Fruit juice.  Meat and Other Protein Products Meat in general. Fatty cuts of meat. Ribs, chicken wings, bacon, sausage, bologna, salami, chitterlings, fatback, hot dogs, bratwurst, and packaged luncheon meats.  Dairy Whole or 2% milk, cream, half-and-half, and cream cheese. Whole-fat or sweetened yogurt. Full-fat cheeses or blue cheese. Nondairy creamers and whipped toppings. Processed cheese, cheese spreads, or cheese curds.  Condiments Onion and garlic salt, seasoned salt, table salt, and sea salt. Canned and packaged gravies. Worcestershire sauce. Tartar sauce.   Barbecue sauce. Teriyaki sauce. Soy sauce, including reduced sodium. Steak sauce. Fish sauce. Oyster sauce. Cocktail sauce. Horseradish. Ketchup and mustard. Meat flavorings and tenderizers. Bouillon cubes. Hot sauce. Tabasco sauce. Marinades. Taco seasonings. Relishes.  Fats and Oils Butter, stick margarine, lard, shortening, ghee, and bacon fat. Coconut, palm kernel, or palm oils. Regular salad dressings.  Pickles and olives. Salted popcorn and pretzels. The items listed above may not be a complete list of foods and beverages to avoid.   Preventive Care for Adults  A healthy lifestyle and preventive care can  promote health and wellness. Preventive health guidelines for men include the following key practices:  A routine yearly physical is a good way to check with your health care provider about your health and preventative screening. It is a chance to share any concerns and updates on your health and to receive a thorough exam.  Visit your dentist for a routine exam and preventative care every 6 months. Brush your teeth twice a day and floss once a day. Good oral hygiene prevents tooth decay and gum disease.  The frequency of eye exams is based on your age, health, family medical history, use of contact lenses, and other factors. Follow your health care provider's recommendations for frequency of eye exams.  Eat a healthy diet. Foods such as vegetables, fruits, whole grains, low-fat dairy products, and lean protein foods contain the nutrients you need without too many calories. Decrease your intake of foods high in solid fats, added sugars, and salt. Eat the right amount of calories for you.Get information about a proper diet from your health care provider, if necessary.  Regular physical exercise is one of the most important things you can do for your health. Most adults should get at least 150 minutes of moderate-intensity exercise (any activity that increases your heart rate and causes you to sweat) each week. In addition, most adults need muscle-strengthening exercises on 2 or more days a week.  Maintain a healthy weight. The body mass index (BMI) is a screening tool to identify possible weight problems. It provides an estimate of body fat based on height and weight. Your health care provider can find your BMI and can help you achieve or maintain a healthy weight.For adults 20 years and older:  A BMI below 18.5 is considered underweight.  A BMI of 18.5 to 24.9 is normal.  A BMI of 25 to 29.9 is considered overweight.  A BMI of 30 and above is considered obese.  Maintain normal blood lipids  and cholesterol levels by exercising and minimizing your intake of saturated fat. Eat a balanced diet with plenty of fruit and vegetables. Blood tests for lipids and cholesterol should begin at age 20 and be repeated every 5 years. If your lipid or cholesterol levels are high, you are over 50, or you are at high risk for heart disease, you may need your cholesterol levels checked more frequently.Ongoing high lipid and cholesterol levels should be treated with medicines if diet and exercise are not working.  If you smoke, find out from your health care provider how to quit. If you do not use tobacco, do not start.  Lung cancer screening is recommended for adults aged 55-80 years who are at high risk for developing lung cancer because of a history of smoking. A yearly low-dose CT scan of the lungs is recommended for people who have at least a 30-pack-year history of smoking and are a current smoker or have quit within the past 15   years. A pack year of smoking is smoking an average of 1 pack of cigarettes a day for 1 year (for example: 1 pack a day for 30 years or 2 packs a day for 15 years). Yearly screening should continue until the smoker has stopped smoking for at least 15 years. Yearly screening should be stopped for people who develop a health problem that would prevent them from having lung cancer treatment.  If you choose to drink alcohol, do not have more than 2 drinks per day. One drink is considered to be 12 ounces (355 mL) of beer, 5 ounces (148 mL) of wine, or 1.5 ounces (44 mL) of liquor.  Avoid use of street drugs. Do not share needles with anyone. Ask for help if you need support or instructions about stopping the use of drugs.  High blood pressure causes heart disease and increases the risk of stroke. Your blood pressure should be checked at least every 1-2 years. Ongoing high blood pressure should be treated with medicines, if weight loss and exercise are not effective.  If you are 45-79  years old, ask your health care provider if you should take aspirin to prevent heart disease.  Diabetes screening involves taking a blood sample to check your fasting blood sugar level. This should be done once every 3 years, after age 45, if you are within normal weight and without risk factors for diabetes. Testing should be considered at a younger age or be carried out more frequently if you are overweight and have at least 1 risk factor for diabetes.  Colorectal cancer can be detected and often prevented. Most routine colorectal cancer screening begins at the age of 50 and continues through age 75. However, your health care provider may recommend screening at an earlier age if you have risk factors for colon cancer. On a yearly basis, your health care provider may provide home test kits to check for hidden blood in the stool. Use of a small camera at the end of a tube to directly examine the colon (sigmoidoscopy or colonoscopy) can detect the earliest forms of colorectal cancer. Talk to your health care provider about this at age 50, when routine screening begins. Direct exam of the colon should be repeated every 5-10 years through age 75, unless early forms of precancerous polyps or small growths are found.   Talk with your health care provider about prostate cancer screening.  Testicular cancer screening isrecommended for adult males. Screening includes self-exam, a health care provider exam, and other screening tests. Consult with your health care provider about any symptoms you have or any concerns you have about testicular cancer.  Use sunscreen. Apply sunscreen liberally and repeatedly throughout the day. You should seek shade when your shadow is shorter than you. Protect yourself by wearing long sleeves, pants, a wide-brimmed hat, and sunglasses year round, whenever you are outdoors.  Once a month, do a whole-body skin exam, using a mirror to look at the skin on your back. Tell your health  care provider about new moles, moles that have irregular borders, moles that are larger than a pencil eraser, or moles that have changed in shape or color.  Stay current with required vaccines (immunizations).  Influenza vaccine. All adults should be immunized every year.  Tetanus, diphtheria, and acellular pertussis (Td, Tdap) vaccine. An adult who has not previously received Tdap or who does not know his vaccine status should receive 1 dose of Tdap. This initial dose should be followed by tetanus   and diphtheria toxoids (Td) booster doses every 10 years. Adults with an unknown or incomplete history of completing a 3-dose immunization series with Td-containing vaccines should begin or complete a primary immunization series including a Tdap dose. Adults should receive a Td booster every 10 years.  Varicella vaccine. An adult without evidence of immunity to varicella should receive 2 doses or a second dose if he has previously received 1 dose.  Human papillomavirus (HPV) vaccine. Males aged 13-21 years who have not received the vaccine previously should receive the 3-dose series. Males aged 22-26 years may be immunized. Immunization is recommended through the age of 26 years for any male who has sex with males and did not get any or all doses earlier. Immunization is recommended for any person with an immunocompromised condition through the age of 26 years if he did not get any or all doses earlier. During the 3-dose series, the second dose should be obtained 4-8 weeks after the first dose. The third dose should be obtained 24 weeks after the first dose and 16 weeks after the second dose.  Zoster vaccine. One dose is recommended for adults aged 60 years or older unless certain conditions are present.    PREVNAR  - Pneumococcal 13-valent conjugate (PCV13) vaccine. When indicated, a person who is uncertain of his immunization history and has no record of immunization should receive the PCV13 vaccine. An  adult aged 19 years or older who has certain medical conditions and has not been previously immunized should receive 1 dose of PCV13 vaccine. This PCV13 should be followed with a dose of pneumococcal polysaccharide (PPSV23) vaccine. The PPSV23 vaccine dose should be obtained at least 8 weeks after the dose of PCV13 vaccine. An adult aged 19 years or older who has certain medical conditions and previously received 1 or more doses of PPSV23 vaccine should receive 1 dose of PCV13. The PCV13 vaccine dose should be obtained 1 or more years after the last PPSV23 vaccine dose.    PNEUMOVAX - Pneumococcal polysaccharide (PPSV23) vaccine. When PCV13 is also indicated, PCV13 should be obtained first. All adults aged 65 years and older should be immunized. An adult younger than age 65 years who has certain medical conditions should be immunized. Any person who resides in a nursing home or long-term care facility should be immunized. An adult smoker should be immunized. People with an immunocompromised condition and certain other conditions should receive both PCV13 and PPSV23 vaccines. People with human immunodeficiency virus (HIV) infection should be immunized as soon as possible after diagnosis. Immunization during chemotherapy or radiation therapy should be avoided. Routine use of PPSV23 vaccine is not recommended for American Indians, [place] Natives, or people younger than 65 years unless there are medical conditions that require PPSV23 vaccine. When indicated, people who have unknown immunization and have no record of immunization should receive PPSV23 vaccine. One-time revaccination 5 years after the first dose of PPSV23 is recommended for people aged 19-64 years who have chronic kidney failure, nephrotic syndrome, asplenia, or immunocompromised conditions. People who received 1-2 doses of PPSV23 before age 65 years should receive another dose of PPSV23 vaccine at age 65 years or later if at least 5 years have passed  since the previous dose. Doses of PPSV23 are not needed for people immunized with PPSV23 at or after age 65 years.    Hepatitis A vaccine. Adults who wish to be protected from this disease, have certain high-risk conditions, work with hepatitis A-infected animals, work in hepatitis A   research labs, or travel to or work in countries with a high rate of hepatitis A should be immunized. Adults who were previously unvaccinated and who anticipate close contact with an international adoptee during the first 60 days after arrival in the [place] from a country with a high rate of hepatitis A should be immunized.    Hepatitis B vaccine. Adults should be immunized if they wish to be protected from this disease, have certain high-risk conditions, may be exposed to blood or other infectious body fluids, are household contacts or sex partners of hepatitis B positive people, are clients or workers in certain care facilities, or travel to or work in countries with a high rate of hepatitis B.   Preventive Service / Frequency   Ages 40 to 64  Blood pressure check.  Lipid and cholesterol check  Lung cancer screening. / Every year if you are aged 55-80 years and have a 30-pack-year history of smoking and currently smoke or have quit within the past 15 years. Yearly screening is stopped once you have quit smoking for at least 15 years or develop a health problem that would prevent you from having lung cancer treatment.  Fecal occult blood test (FOBT) of stool. / Every year beginning at age 50 and continuing until age 75. You may not have to do this test if you get a colonoscopy every 10 years.  Flexible sigmoidoscopy** or colonoscopy.** / Every 5 years for a flexible sigmoidoscopy or every 10 years for a colonoscopy beginning at age 50 and continuing until age 75. Screening for abdominal aortic aneurysm (AAA)  by ultrasound is recommended for people who have history of high blood pressure or who are  current or former smokers. 

## 2015-10-12 ENCOUNTER — Other Ambulatory Visit: Payer: Self-pay | Admitting: Internal Medicine

## 2015-10-12 LAB — VITAMIN D 25 HYDROXY (VIT D DEFICIENCY, FRACTURES): VIT D 25 HYDROXY: 39 ng/mL (ref 30–100)

## 2015-10-12 LAB — TSH: TSH: 1.7 u[IU]/mL (ref 0.350–4.500)

## 2015-10-12 LAB — URINALYSIS, ROUTINE W REFLEX MICROSCOPIC
BILIRUBIN URINE: NEGATIVE
GLUCOSE, UA: NEGATIVE
HGB URINE DIPSTICK: NEGATIVE
KETONES UR: NEGATIVE
Leukocytes, UA: NEGATIVE
Nitrite: NEGATIVE
PROTEIN: NEGATIVE
Specific Gravity, Urine: 1.024 (ref 1.001–1.035)
pH: 5.5 (ref 5.0–8.0)

## 2015-10-12 LAB — HEMOGLOBIN A1C
HEMOGLOBIN A1C: 5.5 % (ref <5.7)
MEAN PLASMA GLUCOSE: 111 mg/dL (ref <117)

## 2015-10-12 LAB — VITAMIN B12: Vitamin B-12: 433 pg/mL (ref 211–911)

## 2015-10-12 LAB — TESTOSTERONE: Testosterone: 258 ng/dL — ABNORMAL LOW (ref 300–890)

## 2015-10-12 LAB — INSULIN, RANDOM: Insulin: 4.7 u[IU]/mL (ref 2.0–19.6)

## 2015-10-12 LAB — MICROALBUMIN / CREATININE URINE RATIO
Creatinine, Urine: 330 mg/dL (ref 20–370)
Microalb Creat Ratio: 8 ug/mg{creat}
Microalb, Ur: 2.5 mg/dL

## 2015-10-12 LAB — PSA: PSA: 0.57 ng/mL (ref <=4.00)

## 2015-10-13 ENCOUNTER — Encounter: Payer: Self-pay | Admitting: Internal Medicine

## 2015-10-13 ENCOUNTER — Ambulatory Visit (AMBULATORY_SURGERY_CENTER): Payer: 59 | Admitting: Internal Medicine

## 2015-10-13 VITALS — BP 134/84 | HR 70 | Temp 97.5°F | Resp 19 | Ht 72.0 in | Wt 161.0 lb

## 2015-10-13 DIAGNOSIS — Z86010 Personal history of colonic polyps: Secondary | ICD-10-CM | POA: Diagnosis present

## 2015-10-13 MED ORDER — SODIUM CHLORIDE 0.9 % IV SOLN: 500.0000 mL | INTRAVENOUS | Status: DC

## 2015-10-13 NOTE — Progress Notes (Signed)
Report to PACU, RN, vss, BBS= Clear.  

## 2015-10-13 NOTE — Patient Instructions (Signed)
YOU HAD AN ENDOSCOPIC PROCEDURE TODAY AT THE [facility]:   Refer to the procedure report that was given to you for any specific questions about what was found during the examination.  If the procedure report does not answer your questions, please call your gastroenterologist to clarify.  If you requested that your care partner not be given the details of your procedure findings, then the procedure report has been included in a sealed envelope for you to review at your convenience later.  YOU SHOULD EXPECT: Some feelings of bloating in the abdomen. Passage of more gas than usual.  Walking can help get rid of the air that was put into your GI tract during the procedure and reduce the bloating. If you had a lower endoscopy (such as a colonoscopy or flexible sigmoidoscopy) you may notice spotting of blood in your stool or on the toilet paper. If you underwent a bowel prep for your procedure, you may not have a normal bowel movement for a few days.  Please Note:  You might notice some irritation and congestion in your nose or some drainage.  This is from the oxygen used during your procedure.  There is no need for concern and it should clear up in a day or so.  SYMPTOMS TO REPORT IMMEDIATELY:   Following lower endoscopy (colonoscopy or flexible sigmoidoscopy):  Excessive amounts of blood in the stool  Significant tenderness or worsening of abdominal pains  Swelling of the abdomen that is new, acute  Fever of 100F or higher   For urgent or emergent issues, a gastroenterologist can be reached at any hour by calling [phone].   DIET: Your first meal following the procedure should be a small meal and then it is ok to progress to your normal diet. Heavy or fried foods are harder to digest and may make you feel nauseous or bloated.  Likewise, meals heavy in dairy and vegetables can increase bloating.  Drink plenty of fluids but you should avoid alcoholic beverages for 24  hours.  ACTIVITY:  You should plan to take it easy for the rest of today and you should NOT DRIVE or use heavy machinery until tomorrow (because of the sedation medicines used during the test).    FOLLOW UP: Our staff will call the number listed on your records the next business day following your procedure to check on you and address any questions or concerns that you may have regarding the information given to you following your procedure. If we do not reach you, we will leave a message.  However, if you are feeling well and you are not experiencing any problems, there is no need to return our call.  We will assume that you have returned to your regular daily activities without incident.  If any biopsies were taken you will be contacted by phone or by letter within the next 1-3 weeks.  Please call us at [phone] if you have not heard about the biopsies in 3 weeks.    SIGNATURES/CONFIDENTIALITY: You and/or your care partner have signed paperwork which will be entered into your electronic medical record.  These signatures attest to the fact that that the information above on your After Visit Summary has been reviewed and is understood.  Full responsibility of the confidentiality of this discharge information lies with you and/or your care-partner. 

## 2015-10-13 NOTE — Op Note (Signed)
[place]  [employer]. [place], [address]   COLONOSCOPY PROCEDURE REPORT  PATIENT: Edwin, Webster  MR#: [id number] BIRTHDATE: August 30, 1961 , 58  yrs. old GENDER: male ENDOSCOPIST: Jerene Bears, MD PROCEDURE DATE:  10/13/2015 PROCEDURE:   Colonoscopy, surveillance First Screening Colonoscopy - Avg.  risk and is 50 yrs.  old or older - No.  Prior Negative Screening - Now for repeat screening. N/A  History of Adenoma - Now for follow-up colonoscopy & has been > or = to 3 yrs.  Yes hx of adenoma.  Has been 3 or more years since last colonoscopy.  Polyps removed today? No Recommend repeat exam, <10 yrs? 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 MEDICATIONS: Monitored anesthesia care and Propofol 140 mg IV  DESCRIPTION OF PROCEDURE:   After the risks benefits and alternatives of the procedure were thoroughly explained, informed consent was obtained.  The digital rectal exam revealed no rectal mass.   The LB [id number]  endoscope was introduced through the anus and advanced to the cecum, which was identified by both the appendix and ileocecal valve. No adverse events experienced. The quality of the prep was excellent.  (Suprep was used)  The instrument was then slowly withdrawn as the colon was fully examined. Estimated blood loss is zero unless otherwise noted in this procedure report.   COLON FINDINGS: There was mild diverticulosis noted in the sigmoid colon.   The examination was otherwise normal.  N polyps or tumors seen.  Retroflexed views revealed internal hemorrhoids. The time to cecum = 1.9 Withdrawal time = 7.7   The scope was withdrawn and the procedure completed.  COMPLICATIONS: There were no immediate complications.  ENDOSCOPIC IMPRESSION: 1.   Mild diverticulosis was noted in the sigmoid colon 2.   The examination was otherwise normal 3.   Internal  hemorrhoids  RECOMMENDATIONS: 1.  High fiber diet 2.  Repeat Colonoscopy in 5 years. 3.  If internal hemorrhoids are causing symptoms (bleeding, prolapse), then nonsurgical banding can be performed in our office.  eSigned:  Jerene Bears, MD 10/13/2015 4:24 PM   cc: Unk Pinto, MD and The Patient

## 2015-10-14 ENCOUNTER — Telehealth: Payer: Self-pay | Admitting: *Deleted

## 2015-10-14 ENCOUNTER — Encounter: Payer: Self-pay | Admitting: *Deleted

## 2015-10-14 NOTE — Telephone Encounter (Signed)
  Follow up Call-  Call back number 10/13/2015  Post procedure Call Back phone  # [phone] hm  Permission to leave phone message Yes     Patient questions:  Do you have a fever, pain , or abdominal swelling? No. Pain Score  0 *  Have you tolerated food without any problems? Yes.    Have you been able to return to your normal activities? No. States that he's quite weak due to the fact that he hadn't eaten in days. Wanted a note for work. Patient was told to let us know today if he needed one.  Do you have any questions about your discharge instructions: Diet   No. Medications  No. Follow up visit  No.  Do you have questions or concerns about your Care? Yes.    Actions: * If pain score is 4 or above: No action needed, pain <4.

## 2015-12-21 ENCOUNTER — Encounter: Payer: Self-pay | Admitting: Internal Medicine

## 2015-12-21 ENCOUNTER — Ambulatory Visit (INDEPENDENT_AMBULATORY_CARE_PROVIDER_SITE_OTHER): Payer: 59 | Admitting: Internal Medicine

## 2015-12-21 VITALS — BP 152/80 | HR 78 | Temp 98.4°F | Resp 18 | Ht 70.0 in | Wt 167.0 lb

## 2015-12-21 DIAGNOSIS — J0140 Acute pansinusitis, unspecified: Secondary | ICD-10-CM

## 2015-12-21 MED ORDER — FLUTICASONE PROPIONATE 50 MCG/ACT NA SUSP: 2.0000 | Freq: Every day | NASAL | Status: DC

## 2015-12-21 MED ORDER — PREDNISONE 20 MG PO TABS: ORAL_TABLET | ORAL | Status: DC

## 2015-12-21 MED ORDER — PROMETHAZINE-DM 6.25-15 MG/5ML PO SYRP: ORAL_SOLUTION | ORAL | Status: DC

## 2015-12-21 MED ORDER — AMOXICILLIN-POT CLAVULANATE 875-125 MG PO TABS: 1.0000 | ORAL_TABLET | Freq: Two times a day (BID) | ORAL | Status: DC

## 2015-12-21 NOTE — Progress Notes (Signed)
Patient ID: JANSSEN PULS, male   DOB: 03/18/1961, 55 y.o.   MRN: [id number]  HPI  Patient presents to the office for evaluation of sinus pressure.  It has been going on for 5 days.  Patient reports minimal throat clearing cough.  They also endorse change in voice, chills, postnasal drip and nasal congestion,  sinus pressure, ear congestion, sore throat..  They have tried sudafed and ibuprofen.  They report that nothing has worked.  They denies other sick contacts.  Review of Systems  Constitutional: Positive for chills and malaise/fatigue. Negative for fever.  HENT: Positive for congestion. Negative for ear pain and sore throat.   Eyes: Negative.   Respiratory: Negative for cough, sputum production, shortness of breath and wheezing.   Cardiovascular: Negative for chest pain, palpitations and leg swelling.  Neurological: Positive for headaches.    PE:  Filed Vitals:   12/21/15 1354  BP: 152/80  Pulse: 78  Temp: 98.4 F (36.9 C)  Resp: 18   General:  Alert and non-toxic, WDWN, NAD HEENT: NCAT, PERLA, EOM normal, no occular discharge or erythema.  Nasal mucosal edema with sinus tenderness to palpation.  Oropharynx clear with minimal oropharyngeal edema and erythema.  Mucous membranes moist and pink. Neck:  Cervical adenopathy Chest:  RRR no MRGs.  Lungs clear to auscultation A&P with no wheezes rhonchi or rales.   Abdomen: +BS x 4 quadrants, soft, non-tender, no guarding, rigidity, or rebound. Skin: warm and dry no rash Neuro: A&Ox4, CN II-XII grossly intact  Assessment and Plan:   1. Acute pansinusitis, recurrence not specified -nasal saline -cont xyzal -benadryl prn -decadron injection here - amoxicillin-clavulanate (AUGMENTIN) 875-125 MG tablet; Take 1 tablet by mouth 2 (two) times daily. One po bid x 7 days  Dispense: 14 tablet; Refill: 0 - predniSONE (DELTASONE) 20 MG tablet; 3 tabs po daily x 3 days, then 2 tabs x 3 days, then 1.5 tabs x 3 days, then 1 tab x 3 days, then  0.5 tabs x 3 days  Dispense: 27 tablet; Refill: 0 - fluticasone (FLONASE) 50 MCG/ACT nasal spray; Place 2 sprays into both nostrils daily.  Dispense: 16 g; Refill: 0 - promethazine-dextromethorphan (PROMETHAZINE-DM) 6.25-15 MG/5ML syrup; Take 5-10 mL PO q8hrs prn for sinus symptoms  Dispense: 360 mL; Refill: 1

## 2015-12-21 NOTE — Patient Instructions (Signed)
Please use saline in your nose as often as tolerated.  Please take prednisone until it is completely gone.  Please take augmentin until it is gone.  Please use flonase 2 sprays per nostril at bedtime.  Please continue to take the levocetirizine.    Please take cough syrup 3 times a day as needed to help with congestion.  Please take 50 mg of benadryl at night time if desired to help with sleeping.

## 2016-01-02 ENCOUNTER — Other Ambulatory Visit: Payer: Self-pay | Admitting: Internal Medicine

## 2016-01-11 ENCOUNTER — Ambulatory Visit (INDEPENDENT_AMBULATORY_CARE_PROVIDER_SITE_OTHER): Payer: 59 | Admitting: Physician Assistant

## 2016-01-11 ENCOUNTER — Ambulatory Visit: Payer: Self-pay | Admitting: Internal Medicine

## 2016-01-11 ENCOUNTER — Encounter: Payer: Self-pay | Admitting: Physician Assistant

## 2016-01-11 ENCOUNTER — Other Ambulatory Visit: Payer: Self-pay

## 2016-01-11 VITALS — BP 140/80 | HR 69 | Temp 97.5°F | Resp 16 | Ht 70.0 in | Wt 169.4 lb

## 2016-01-11 DIAGNOSIS — R030 Elevated blood-pressure reading, without diagnosis of hypertension: Secondary | ICD-10-CM | POA: Diagnosis not present

## 2016-01-11 DIAGNOSIS — Z79899 Other long term (current) drug therapy: Secondary | ICD-10-CM | POA: Diagnosis not present

## 2016-01-11 DIAGNOSIS — M0600 Rheumatoid arthritis without rheumatoid factor, unspecified site: Secondary | ICD-10-CM | POA: Diagnosis not present

## 2016-01-11 DIAGNOSIS — E782 Mixed hyperlipidemia: Secondary | ICD-10-CM

## 2016-01-11 DIAGNOSIS — E559 Vitamin D deficiency, unspecified: Secondary | ICD-10-CM | POA: Diagnosis not present

## 2016-01-11 DIAGNOSIS — R7303 Prediabetes: Secondary | ICD-10-CM

## 2016-01-11 LAB — CBC WITH DIFFERENTIAL/PLATELET
BASOS ABS: 0 10*3/uL (ref 0.0–0.1)
BASOS PCT: 0 % (ref 0–1)
EOS ABS: 0.1 10*3/uL (ref 0.0–0.7)
Eosinophils Relative: 3 % (ref 0–5)
HCT: 39.3 % (ref 39.0–52.0)
Hemoglobin: 13.3 g/dL (ref 13.0–17.0)
Lymphocytes Relative: 39 % (ref 12–46)
Lymphs Abs: 1.9 10*3/uL (ref 0.7–4.0)
MCH: 31.4 pg (ref 26.0–34.0)
MCHC: 33.8 g/dL (ref 30.0–36.0)
MCV: 92.7 fL (ref 78.0–100.0)
MPV: 9.1 fL (ref 8.6–12.4)
Monocytes Absolute: 0.7 10*3/uL (ref 0.1–1.0)
Monocytes Relative: 14 % — ABNORMAL HIGH (ref 3–12)
NEUTROS PCT: 44 % (ref 43–77)
Neutro Abs: 2.1 10*3/uL (ref 1.7–7.7)
PLATELETS: 239 10*3/uL (ref 150–400)
RBC: 4.24 MIL/uL (ref 4.22–5.81)
RDW: 14.3 % (ref 11.5–15.5)
WBC: 4.8 10*3/uL (ref 4.0–10.5)

## 2016-01-11 MED ORDER — LEVOCETIRIZINE DIHYDROCHLORIDE 5 MG PO TABS: 5.0000 mg | ORAL_TABLET | Freq: Every evening | ORAL | Status: DC

## 2016-01-11 NOTE — Progress Notes (Signed)
Assessment and Plan:  Hypertension:  -Continue medication,  -monitor blood pressure at home.  -Continue DASH diet.   -Reminder to go to the ER if any CP, SOB, nausea, dizziness, severe HA, changes vision/speech, left arm numbness and tingling, and jaw pain.  Cholesterol: -Continue diet and exercise.  -Check cholesterol.   Pre-diabetes: -Continue diet and exercise.  -Check A1C  Vitamin D Def: -check level -continue medications.   Depression Continue lexapro  Continue diet and meds as discussed. Further disposition pending results of labs.  HPI 55 y.o. male  presents for 3 month follow up with hypertension, hyperlipidemia, prediabetes and vitamin D.   His blood pressure has been controlled at home, today their BP is BP: 140/80 mmHg.   He does workout. He denies chest pain, shortness of breath, dizziness.   He is on cholesterol medication and denies myalgias. His cholesterol is not at goal. The cholesterol last visit was:   Lab Results  Component Value Date   CHOL 185 10/11/2015   HDL 47 10/11/2015   LDLCALC 123 10/11/2015   TRIG 74 10/11/2015   CHOLHDL 3.9 10/11/2015    He has been working on diet and exercise for prediabetes, and denies foot ulcerations, hyperglycemia, hypoglycemia , increased appetite, nausea, paresthesia of the feet, polydipsia, polyuria, visual disturbances, vomiting and weight loss. Last A1C in the office was:  Lab Results  Component Value Date   HGBA1C 5.5 10/11/2015   He was switched from celexa to lexapro and reports better mood. Mom passed 12/12.  Patient is on Vitamin D supplement, 10,000 IU.  Lab Results  Component Value Date   VD25OH 39 10/11/2015       Current Medications:  Current Outpatient Prescriptions on File Prior to Visit  Medication Sig Dispense Refill  . aspirin EC 81 MG tablet Take 81 mg by mouth daily.    . Cholecalciferol (VITAMIN D-3) 5000 UNITS TABS Take 5,000 Units by mouth daily.    Marland Kitchen escitalopram (LEXAPRO) 10 MG  tablet Take 1 tablet (10 mg total) by mouth daily. 30 tablet 2  . fluticasone (FLONASE) 50 MCG/ACT nasal spray Place 2 sprays into both nostrils daily. 16 g 0  . folic acid (FOLVITE) 1 MG tablet Take 1 mg by mouth.    . levocetirizine (XYZAL) 5 MG tablet Take 5 mg by mouth every evening.    . Methotrexate, Anti-Rheumatic, (METHOTREXATE, PF, [place]) Inject into the skin once a week.    Marland Kitchen atorvastatin (LIPITOR) 80 MG tablet Take 1/2 to 1 tablet as directed for cholesterol 90 tablet 3   No current facility-administered medications on file prior to visit.    Medical History:  Past Medical History  Diagnosis Date  . Chest pain, non-cardiac   . Hyperlipidemia   . Allergy   . Anxiety   . Depression   . COPD (chronic obstructive pulmonary disease) ([place])   . Gout   . GERD (gastroesophageal reflux disease)   . Diverticulitis   . Anemia   . Arthritis   . Rheumatoid arthritis ([place])     hands  . Kidney stones     Allergies:  Allergies  Allergen Reactions  . Humira [Adalimumab] Other (See Comments)    "blacked out", confused     Review of Systems:  Review of Systems  Constitutional: Positive for malaise/fatigue. Negative for fever and chills.  HENT: Negative for congestion, ear discharge and sore throat.   Eyes: Negative.   Respiratory: Negative for cough, shortness of breath and wheezing.  Cardiovascular: Negative for chest pain, palpitations and leg swelling.  Gastrointestinal: Negative for heartburn, diarrhea, constipation, blood in stool and melena.  Genitourinary: Negative.   Skin: Negative for itching and rash.  Neurological: Negative for dizziness, sensory change and headaches.  Psychiatric/Behavioral: Positive for depression. The patient is not nervous/anxious and does not have insomnia.     Family history- Review and unchanged  Social history- Review and unchanged  Physical Exam: BP 140/80 mmHg  Pulse 69  Temp(Src) 97.5 F (36.4 C) (Temporal)  Resp 16  Ht 5\' 10"   (1.778 m)  Wt 169 lb 6.4 oz (76.839 kg)  BMI 24.31 kg/m2  SpO2 96% Wt Readings from Last 3 Encounters:  01/11/16 169 lb 6.4 oz (76.839 kg)  12/21/15 167 lb (75.751 kg)  10/13/15 161 lb (73.029 kg)    General Appearance: Well nourished well developed, in no apparent distress. Eyes: PERRLA, EOMs, conjunctiva no swelling or erythema ENT/Mouth: Ear canals normal without obstruction, swelling, erythma, discharge.  TMs normal bilaterally.  Oropharynx moist, clear, without exudate, or postoropharyngeal swelling. Neck: Supple, thyroid normal,no cervical adenopathy  Respiratory: Respiratory effort normal, Breath sounds clear A&P without rhonchi, wheeze, or rale.  No retractions, no accessory usage. Cardio: RRR with no MRGs. Brisk peripheral pulses without edema.  Abdomen: Soft, + BS,  Non tender, no guarding, rebound, hernias, masses. Musculoskeletal: Full ROM, 5/5 strength, Normal gait Skin: Warm, dry without rashes, lesions, ecchymosis.  Neuro: Awake and oriented X 3, Cranial nerves intact. Normal muscle tone, no cerebellar symptoms. Psych: Normal affect, Insight and Judgment appropriate.    Vicie Mutters, PA-C 4:06 PM [facility]

## 2016-01-11 NOTE — Patient Instructions (Addendum)
Monitor your blood pressure at home. Go to the ER if any CP, SOB, nausea, dizziness, severe HA, changes vision/speech  Goal BP:  For patients younger than 60: Goal BP < 140/90. For patients 60 and older: Goal BP < 150/90. For patients with diabetes: Goal BP < 140/90. Your most recent BP: BP: 140/80 mmHg   Take your medications faithfully as instructed. Maintain a healthy weight. Get at least 150 minutes of aerobic exercise per week. Minimize salt intake. Minimize alcohol intake  DASH Eating Plan DASH stands for "Dietary Approaches to Stop Hypertension." The DASH eating plan is a healthy eating plan that has been shown to reduce high blood pressure (hypertension). Additional health benefits may include reducing the risk of type 2 diabetes mellitus, heart disease, and stroke. The DASH eating plan may also help with weight loss. WHAT DO I NEED TO KNOW ABOUT THE DASH EATING PLAN? For the DASH eating plan, you will follow these general guidelines:  Choose foods with a percent daily value for sodium of less than 5% (as listed on the food label).  Use salt-free seasonings or herbs instead of table salt or sea salt.  Check with your health care provider or pharmacist before using salt substitutes.  Eat lower-sodium products, often labeled as "lower sodium" or "no salt added."  Eat fresh foods.  Eat more vegetables, fruits, and low-fat dairy products.  Choose whole grains. Look for the word "whole" as the first word in the ingredient list.  Choose fish and skinless chicken or turkey more often than red meat. Limit fish, poultry, and meat to 6 oz (170 g) each day.  Limit sweets, desserts, sugars, and sugary drinks.  Choose heart-healthy fats.  Limit cheese to 1 oz (28 g) per day.  Eat more home-cooked food and less restaurant, buffet, and fast food.  Limit fried foods.  Cook foods using methods other than frying.  Limit canned vegetables. If you do use them, rinse them well to  decrease the sodium.  When eating at a restaurant, ask that your food be prepared with less salt, or no salt if possible. WHAT FOODS CAN I EAT? Seek help from a dietitian for individual calorie needs. Grains Whole grain or whole wheat bread. [place] rice. Whole grain or whole wheat pasta. Quinoa, bulgur, and whole grain cereals. Low-sodium cereals. Corn or whole wheat flour tortillas. Whole grain cornbread. Whole grain crackers. Low-sodium crackers. Vegetables Fresh or frozen vegetables (raw, steamed, roasted, or grilled). Low-sodium or reduced-sodium tomato and vegetable juices. Low-sodium or reduced-sodium tomato sauce and paste. Low-sodium or reduced-sodium canned vegetables.  Fruits All fresh, canned (in natural juice), or frozen fruits. Meat and Other Protein Products Ground beef (85% or leaner), grass-fed beef, or beef trimmed of fat. Skinless chicken or turkey. Ground chicken or turkey. Pork trimmed of fat. All fish and seafood. Eggs. Dried beans, peas, or lentils. Unsalted nuts and seeds. Unsalted canned beans. Dairy Low-fat dairy products, such as skim or 1% milk, 2% or reduced-fat cheeses, low-fat ricotta or cottage cheese, or plain low-fat yogurt. Low-sodium or reduced-sodium cheeses. Fats and Oils Tub margarines without trans fats. Light or reduced-fat mayonnaise and salad dressings (reduced sodium). Avocado. Safflower, olive, or canola oils. Natural peanut or almond butter. Other Unsalted popcorn and pretzels. The items listed above may not be a complete list of recommended foods or beverages. Contact your dietitian for more options. WHAT FOODS ARE NOT RECOMMENDED? Grains White bread. White pasta. White rice. Refined cornbread. Bagels and croissants. Crackers that contain   trans fat. Vegetables Creamed or fried vegetables. Vegetables in a cheese sauce. Regular canned vegetables. Regular canned tomato sauce and paste. Regular tomato and vegetable juices. Fruits Dried fruits. Canned  fruit in light or heavy syrup. Fruit juice. Meat and Other Protein Products Fatty cuts of meat. Ribs, chicken wings, bacon, sausage, bologna, salami, chitterlings, fatback, hot dogs, bratwurst, and packaged luncheon meats. Salted nuts and seeds. Canned beans with salt. Dairy Whole or 2% milk, cream, half-and-half, and cream cheese. Whole-fat or sweetened yogurt. Full-fat cheeses or blue cheese. Nondairy creamers and whipped toppings. Processed cheese, cheese spreads, or cheese curds. Condiments Onion and garlic salt, seasoned salt, table salt, and sea salt. Canned and packaged gravies. Worcestershire sauce. Tartar sauce. Barbecue sauce. Teriyaki sauce. Soy sauce, including reduced sodium. Steak sauce. Fish sauce. Oyster sauce. Cocktail sauce. Horseradish. Ketchup and mustard. Meat flavorings and tenderizers. Bouillon cubes. Hot sauce. Tabasco sauce. Marinades. Taco seasonings. Relishes. Fats and Oils Butter, stick margarine, lard, shortening, ghee, and bacon fat. Coconut, palm kernel, or palm oils. Regular salad dressings. Other Pickles and olives. Salted popcorn and pretzels. The items listed above may not be a complete list of foods and beverages to avoid. Contact your dietitian for more information. WHERE CAN I FIND MORE INFORMATION? National Heart, Lung, and Blood Institute: www.nhlbi.nih.gov/health/health-topics/topics/dash/ Document Released: 11/01/2011 Document Revised: 03/29/2014 Document Reviewed: 09/16/2013 Exit[employer] Patient Information 2015 [employer]. This information is not intended to replace advice given to you by your health care provider. Make sure you discuss any questions you have with your health care provider.  

## 2016-01-12 ENCOUNTER — Other Ambulatory Visit: Payer: Self-pay | Admitting: Physician Assistant

## 2016-01-12 LAB — VITAMIN D 25 HYDROXY (VIT D DEFICIENCY, FRACTURES): VIT D 25 HYDROXY: 32 ng/mL (ref 30–100)

## 2016-01-12 LAB — BASIC METABOLIC PANEL WITH GFR
BUN: 13 mg/dL (ref 7–25)
CALCIUM: 9 mg/dL (ref 8.6–10.3)
CHLORIDE: 104 mmol/L (ref 98–110)
CO2: 26 mmol/L (ref 20–31)
CREATININE: 1.13 mg/dL (ref 0.70–1.33)
GFR, Est African American: 84 mL/min (ref >=60)
GFR, Est Non African American: 72 mL/min (ref >=60)
Glucose, Bld: 78 mg/dL (ref 65–99)
Potassium: 4.1 mmol/L (ref 3.5–5.3)
SODIUM: 139 mmol/L (ref 135–146)

## 2016-01-12 LAB — HEPATIC FUNCTION PANEL
ALBUMIN: 4 g/dL (ref 3.6–5.1)
ALT: 12 U/L (ref 9–46)
AST: 17 U/L (ref 10–35)
Alkaline Phosphatase: 77 U/L (ref 40–115)
BILIRUBIN TOTAL: 0.4 mg/dL (ref 0.2–1.2)
Bilirubin, Direct: 0.1 mg/dL (ref <=0.2)
Indirect Bilirubin: 0.3 mg/dL (ref 0.2–1.2)
Total Protein: 6.5 g/dL (ref 6.1–8.1)

## 2016-01-12 LAB — LIPID PANEL
CHOLESTEROL: 201 mg/dL — AB (ref 125–200)
HDL: 44 mg/dL (ref >=40)
LDL Cholesterol: 140 mg/dL — ABNORMAL HIGH (ref <130)
TRIGLYCERIDES: 84 mg/dL (ref <150)
Total CHOL/HDL Ratio: 4.6 Ratio (ref <=5.0)
VLDL: 17 mg/dL (ref <30)

## 2016-01-12 LAB — TSH: TSH: 1.74 m[IU]/L (ref 0.40–4.50)

## 2016-01-12 LAB — MAGNESIUM: Magnesium: 2.2 mg/dL (ref 1.5–2.5)

## 2016-01-25 ENCOUNTER — Other Ambulatory Visit: Payer: Self-pay | Admitting: Internal Medicine

## 2016-03-06 ENCOUNTER — Other Ambulatory Visit: Payer: Self-pay | Admitting: Internal Medicine

## 2016-03-26 ENCOUNTER — Telehealth: Payer: Self-pay | Admitting: Internal Medicine

## 2016-03-26 MED ORDER — PREDNISONE 20 MG PO TABS: ORAL_TABLET | ORAL | Status: DC

## 2016-03-26 MED ORDER — ONDANSETRON HCL 4 MG PO TABS: 4.0000 mg | ORAL_TABLET | Freq: Every day | ORAL | Status: DC | PRN

## 2016-03-26 MED ORDER — FLUTICASONE PROPIONATE 50 MCG/ACT NA SUSP: 2.0000 | Freq: Every day | NASAL | Status: DC

## 2016-03-26 NOTE — Telephone Encounter (Signed)
Patient calling with sinus drainage, nausea, cold chills, sweats, and body aches since Friday. Will send in prednisone, zofran, flonase, and recommend using nasal saline and zyrtec OTC.  Patient to schedule an appointment if no improvement in 3-5 days.

## 2016-04-16 ENCOUNTER — Encounter: Payer: Self-pay | Admitting: Internal Medicine

## 2016-04-16 ENCOUNTER — Ambulatory Visit (INDEPENDENT_AMBULATORY_CARE_PROVIDER_SITE_OTHER): Payer: 59 | Admitting: Internal Medicine

## 2016-04-16 VITALS — BP 126/70 | HR 64 | Temp 98.4°F | Resp 18 | Ht 70.0 in | Wt 170.0 lb

## 2016-04-16 DIAGNOSIS — R7303 Prediabetes: Secondary | ICD-10-CM

## 2016-04-16 DIAGNOSIS — R030 Elevated blood-pressure reading, without diagnosis of hypertension: Secondary | ICD-10-CM | POA: Diagnosis not present

## 2016-04-16 DIAGNOSIS — E559 Vitamin D deficiency, unspecified: Secondary | ICD-10-CM

## 2016-04-16 DIAGNOSIS — Z79899 Other long term (current) drug therapy: Secondary | ICD-10-CM

## 2016-04-16 DIAGNOSIS — M0520 Rheumatoid vasculitis with rheumatoid arthritis of unspecified site: Secondary | ICD-10-CM

## 2016-04-16 DIAGNOSIS — I00 Rheumatic fever without heart involvement: Secondary | ICD-10-CM

## 2016-04-16 DIAGNOSIS — E782 Mixed hyperlipidemia: Secondary | ICD-10-CM

## 2016-04-16 NOTE — Patient Instructions (Signed)
Recommend Adult Low Dose Aspirin or   coated  Aspirin 81 mg daily   To reduce risk of Colon Cancer 20 %,   Skin Cancer 26 % ,   Melanoma 46%   and   Pancreatic cancer 60%   ++++++++++++++++++++++++++++++++++++++++++++++++++++++ Vitamin D goal   is between 70-100.   Please make sure that you are taking your Vitamin D as directed.   It is very important as a natural anti-inflammatory   helping hair, skin, and nails, as well as reducing stroke and heart attack risk.   It helps your bones and helps with mood.  It also decreases numerous cancer risks so please take it as directed.   Low Vit D is associated with a 200-300% higher risk for CANCER   and 200-300% higher risk for HEART   ATTACK  &  STROKE.   ......................................  It is also associated with higher death rate at younger ages,   autoimmune diseases like Rheumatoid arthritis, Lupus, Multiple Sclerosis.     Also many other serious conditions, like depression, Alzheimer's  Dementia, infertility, muscle aches, fatigue, fibromyalgia - just to name a few.  ++++++++++++++++++++++++++++++++++++++++++++++++  Recommend the book "The END of DIETING" by Dr Joel Fuhrman   & the book "The END of DIABETES " by Dr Joel Fuhrman  At [employer] - get book & Audio CD's     Being diabetic has a  300% increased risk for heart attack, stroke, cancer, and alzheimer- type vascular dementia. It is very important that you work harder with diet by avoiding all foods that are white. Avoid white rice (V[place]on & wild rice is OK), white potatoes (sweetpotatoes in moderation is OK), White bread or wheat bread or anything made out of white flour like bagels, donuts, rolls, buns, biscuits, cakes, pastries, cookies, pizza crust, and pasta (made from white flour & egg whites) - vegetarian pasta or spinach or wheat pasta is OK. Multigrain breads like Arnold's or Pepperidge Farm, or multigrain sandwich thins or flatbreads.  Diet,  exercise and weight loss can reverse and cure diabetes in the early stages.  Diet, exercise and weight loss is very important in the control and prevention of complications of diabetes which affects every system in your body, ie. Brain - dementia/stroke, eyes - glaucoma/blindness, heart - heart attack/heart failure, kidneys - dialysis, stomach - gastric paralysis, intestines - malabsorption, nerves - severe painful neuritis, circulation - gangrene & loss of a leg(s), and finally cancer and Alzheimers.    I recommend avoid fried & greasy foods,  sweets/candy, white rice ([place] or wild rice or Quinoa is OK), white potatoes (sweet potatoes are OK) - anything made from white flour - bagels, doughnuts, rolls, buns, biscuits,white and wheat breads, pizza crust and traditional pasta made of white flour & egg white(vegetarian pasta or spinach or wheat pasta is OK).  Multi-grain bread is OK - like multi-grain flat bread or sandwich thins. Avoid alcohol in excess. Exercise is also important.    Eat all the vegetables you want - avoid meat, especially red meat and dairy - especially cheese.  Cheese is the most concentrated form of trans-fats which is the worst thing to clog up our arteries. Veggie cheese is OK which can be found in the fresh produce section at [employer] or [employer] or [employer]  ++++++++++++++++++++++++++++++++++++++++++++++++++ DASH Eating Plan  DASH stands for "Dietary Approaches to Stop Hypertension."   The DASH eating plan is a healthy eating plan that has been shown to reduce high blood   pressure (hypertension). Additional health benefits may include reducing the risk of type 2 diabetes mellitus, heart disease, and stroke. The DASH eating plan may also help with weight loss.  WHAT DO I NEED TO KNOW ABOUT THE DASH EATING PLAN?  For the DASH eating plan, you will follow these general guidelines:  Choose foods with a percent daily value for sodium of less than 5% (as listed on the food  label).  Use salt-free seasonings or herbs instead of table salt or sea salt.  Check with your health care provider or pharmacist before using salt substitutes.  Eat lower-sodium products, often labeled as "lower sodium" or "no salt added."  Eat fresh foods.  Eat more vegetables, fruits, and low-fat dairy products.    Choose whole grains. Look for the word "whole" as the first word in the ingredient list.  Choose fish   Limit sweets, desserts, sugars, and sugary drinks.  Choose heart-healthy fats.  Eat veggie cheese   Eat more home-cooked food and less restaurant, buffet, and fast food.  Limit fried foods.  Cook foods using methods other than frying.  Limit canned vegetables. If you do use them, rinse them well to decrease the sodium.  When eating at a restaurant, ask that your food be prepared with less salt, or no salt if possible.                      WHAT FOODS CAN I EAT?  Read Dr Joel Fuhrman's books on The End of Dieting & The End of Diabetes  Grains  Whole grain or whole wheat bread. Oliveria rice. Whole grain or whole wheat pasta. Quinoa, bulgur, and whole grain cereals. Low-sodium cereals. Corn or whole wheat flour tortillas. Whole grain cornbread. Whole grain crackers. Low-sodium crackers.  Vegetables  Fresh or frozen vegetables (raw, steamed, roasted, or grilled). Low-sodium or reduced-sodium tomato and vegetable juices. Low-sodium or reduced-sodium tomato sauce and paste. Low-sodium or reduced-sodium canned vegetables.   Fruits  All fresh, canned (in natural juice), or frozen fruits.  Protein Products   All fish and seafood.  Dried beans, peas, or lentils. Unsalted nuts and seeds. Unsalted canned beans.  Dairy  Low-fat dairy products, such as skim or 1% milk, 2% or reduced-fat cheeses, low-fat ricotta or cottage cheese, or plain low-fat yogurt. Low-sodium or reduced-sodium cheeses.  Fats and Oils  Tub margarines without trans fats. Light or  reduced-fat mayonnaise and salad dressings (reduced sodium). Avocado. Safflower, olive, or canola oils. Natural peanut or almond butter.  Other  Unsalted popcorn and pretzels. The items listed above may not be a complete list of recommended foods or beverages. Contact your dietitian for more options.  +++++++++++++++++++++++++++++++++++++++++++  WHAT FOODS ARE NOT RECOMMENDED?  Grains/ White flour or wheat flour  White bread. White pasta. White rice. Refined cornbread. Bagels and croissants. Crackers that contain trans fat.  Vegetables  Creamed or fried vegetables. Vegetables in a . Regular canned vegetables. Regular canned tomato sauce and paste. Regular tomato and vegetable juices.  Fruits  Dried fruits. Canned fruit in light or heavy syrup. Fruit juice.  Meat and Other Protein Products  Meat in general - RED mwaet & White meat.  Fatty cuts of meat. Ribs, chicken wings, bacon, sausage, bologna, salami, chitterlings, fatback, hot dogs, bratwurst, and packaged luncheon meats.  Dairy  Whole or 2% milk, cream, half-and-half, and cream cheese. Whole-fat or sweetened yogurt. Full-fat cheeses or blue cheese. Nondairy creamers and whipped toppings. Processed cheese, cheese spreads, or   cheese curds.  Condiments  Onion and garlic salt, seasoned salt, table salt, and sea salt. Canned and packaged gravies. Worcestershire sauce. Tartar sauce. Barbecue sauce. Teriyaki sauce. Soy sauce, including reduced sodium. Steak sauce. Fish sauce. Oyster sauce. Cocktail sauce. Horseradish. Ketchup and mustard. Meat flavorings and tenderizers. Bouillon cubes. Hot sauce. Tabasco sauce. Marinades. Taco seasonings. Relishes.  Fats and Oils Butter, stick margarine, lard, shortening and bacon fat. Coconut, palm kernel, or palm oils. Regular salad dressings.  Pickles and olives. Salted popcorn and pretzels.  The items listed above may not be a complete list of foods and beverages to avoid.   

## 2016-04-16 NOTE — Progress Notes (Signed)
Patient ID: Edwin Webster, male   DOB: February 26, 1961, 55 y.o.   MRN: [id number]  [facility]                       Unk Pinto, M.D.        Uvaldo Bristle. Silverio Lay, P.A.-C       Starlyn Skeans, P.A.-C   [facility]                [address] [place] [id number] Telephone [phone] Telefax [phone] _________________________________________________________________________   This very nice 55 y.o. MWM presents for follow up with Hypertension, Hyperlipidemia, Pre-Diabetes, Rheumatoid Arthritis  and Vitamin D Deficiency. 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    Patient is followed expectantly for labile HTN since 2013 & BP has been controlled and today's BP: 126/70 mmHg. Patient has had no complaints of any cardiac type chest pain, palpitations, dyspnea/orthopnea/PND, dizziness, claudication, or dependent edema.   Hyperlipidemia is not controlled with diet & sporadic compliance of meds. Patient denies myalgias or other med SE's. Last Lipids were not at goal with Cholesterol 201*; HDL 44; elevated LDL  140*; Triglycerides 84 on 01/11/2016.   Also, the patient has history of PreDiabetes and has had no symptoms of reactive hypoglycemia, diabetic polys, paresthesias or visual blurring.  Last A1c was 10/11/2015: Hgb A1c MFr Bld 5.5    Further, the patient also has history of Vitamin D Deficiency and very sporadically supplements vitamin D. Last vitamin D was still extremely low at  32 on 01/11/2016.  Medication Sig  . aspirin EC 81 MG  Take 81 mg by mouth daily.  Marland Kitchen atorvastatin  80 MG  Take 1/2 to 1 tablet as directed for cholesterol  . VITAMIN D 5000 UNITS  Take 5,000 Units by mouth daily.  Marland Kitchen escitalopram  10 MG  Take 1 tablet (10 mg total) by mouth daily.  . folic acid MG Take 1 mg by mouth.  . levocetirizine  5 MG  Take 1 tablet  (5 mg total) by mouth every evening.  Marland Kitchen FLONASE nasal spray Place 2 sprays into both nostrils daily.  Marland Kitchen ibuprofen  800 MG Reported on 04/16/2016  . inFLIXimab (REMICADE) 100 MG inj Reported on 04/16/2016  . Methotrexate  50 MG/2ML injection Reported on 04/16/2016  - Inject into the skin once a week.  . ondansetron (ZOFRAN) 4 MG Take 1 tablet (4 mg total) by mouth daily as needed for nausea or vomiting. (Patient not taking: Reported on 04/16/2016)   Allergies  Allergen Reactions  . Humira [Adalimumab] Other (See Comments)    "blacked out", confused   PMHx:   Past Medical History  Diagnosis Date  . Chest pain, non-cardiac   . Hyperlipidemia   . Allergy   . Anxiety   . Depression   . COPD (chronic obstructive pulmonary disease) ([place])   . Gout   . GERD (gastroesophageal reflux disease)   . Diverticulitis   . Anemia   . Arthritis   . Rheumatoid arthritis ([place])     hands  . Kidney stones    Immunization History  Administered Date(s) Administered  . DTaP 06/04/2011  . Hepatitis B 11/26/2010  . Influenza Split 09/02/2014  . Influenza Whole 09/17/2013  . Influenza, Seasonal,  Injecte, Preservative Fre 10/11/2015  . PPD Test 10/11/2015   Past Surgical History  Procedure Laterality Date  . Rotator cuff repair  1996    left  . Knee surgery      right  . Colonoscopy    . Finger surgery     FHx:    Reviewed / unchanged  SHx:    Reviewed / unchanged  Systems Review:  Constitutional: Denies fever, chills, wt changes, headaches, insomnia, fatigue, night sweats, change in appetite. Eyes: Denies redness, blurred vision, diplopia, discharge, itchy, watery eyes.  ENT: Denies discharge, congestion, post nasal drip, epistaxis, sore throat, earache, hearing loss, dental pain, tinnitus, vertigo, sinus pain, snoring.  CV: Denies chest pain, palpitations, irregular heartbeat, syncope, dyspnea, diaphoresis, orthopnea, PND, claudication or edema. Respiratory: denies cough, dyspnea, DOE,  pleurisy, hoarseness, laryngitis, wheezing.  Gastrointestinal: Denies dysphagia, odynophagia, heartburn, reflux, water brash, abdominal pain or cramps, nausea, vomiting, bloating, diarrhea, constipation, hematemesis, melena, hematochezia  or hemorrhoids. Genitourinary: Denies dysuria, frequency, urgency, nocturia, hesitancy, discharge, hematuria or flank pain. Musculoskeletal: Denies arthralgias, myalgias, stiffness, jt. swelling, pain, limping or strain/sprain.  Skin: Denies pruritus, rash, hives, warts, acne, eczema or change in skin lesion(s). Neuro: No weakness, tremor, incoordination, spasms, paresthesia or pain. Psychiatric: Denies confusion, memory loss or sensory loss. Endo: Denies change in weight, skin or hair change.  Heme/Lymph: No excessive bleeding, bruising or enlarged lymph nodes.  Physical Exam  BP 126/70 mmHg  Pulse 64  Temp(Src) 98.4 F (36.9 C) (Temporal)  Resp 18  Ht 5\' 10"  (1.778 m)  Wt 170 lb (77.111 kg)  BMI 24.39 kg/m2  Appears well nourished and in no distress. Eyes: PERRLA, EOMs, conjunctiva no swelling or erythema. Sinuses: No frontal/maxillary tenderness ENT/Mouth: EAC's clear, TM's nl w/o erythema, bulging. Nares clear w/o erythema, swelling, exudates. Oropharynx clear without erythema or exudates. Oral hygiene is good. Tongue normal, non obstructing. Hearing intact.  Neck: Supple. Thyroid nl. Car 2+/2+ without bruits, nodes or JVD. Chest: Respirations nl with BS clear & equal w/o rales, rhonchi, wheezing or stridor.  Cor: Heart sounds normal w/ regular rate and rhythm without sig. murmurs, gallops, clicks, or rubs. Peripheral pulses normal and equal  without edema.  Abdomen: Soft & bowel sounds normal. Non-tender w/o guarding, rebound, hernias, masses, or organomegaly.  Lymphatics: Unremarkable.  Musculoskeletal: Full ROM all peripheral extremities, joint stability, 5/5 strength, and normal gait.  Skin: Warm, dry without exposed rashes, lesions or  ecchymosis apparent.  Neuro: Cranial nerves intact, reflexes equal bilaterally. Sensory-motor testing grossly intact. Tendon reflexes grossly intact.  Pysch: Alert & oriented x 3.  Insight and judgement nl & appropriate. No ideations.  Assessment and Plan:  1. Elev BP w/o Dx /o HTN  - TSH  2. Hyperlipidemia  - Lipid panel - TSH  3. Prediabetes  - Hemoglobin A1c - Insulin, random  4. Vitamin D deficiency  - VITAMIN D 25 Hydroxy (Vit-D Deficiency, Fractures)  5. Medication management  - CBC with Differential/Platelet - BASIC METABOLIC PANEL WITH GFR - Hepatic function panel - Magnesium  6. Rheumatoid arteritis  - currently on new trial with Tofacitinib Citrate (XELJANZ PO)   Recommended regular exercise, BP monitoring, weight control, and discussed med and SE's. Recommended labs to assess and monitor clinical status. Further disposition pending results of labs. Over 30 minutes of exam, counseling, chart review was performed

## 2016-04-17 LAB — CBC WITH DIFFERENTIAL/PLATELET
BASOS ABS: 0 {cells}/uL (ref 0–200)
BASOS PCT: 0 %
EOS PCT: 3 %
Eosinophils Absolute: 138 cells/uL (ref 15–500)
HCT: 36.1 % — ABNORMAL LOW (ref 38.5–50.0)
Hemoglobin: 12.1 g/dL — ABNORMAL LOW (ref 13.2–17.1)
Lymphocytes Relative: 40 %
Lymphs Abs: 1840 cells/uL (ref 850–3900)
MCH: 32 pg (ref 27.0–33.0)
MCHC: 33.5 g/dL (ref 32.0–36.0)
MCV: 95.5 fL (ref 80.0–100.0)
MONOS PCT: 12 %
MPV: 9.9 fL (ref 7.5–12.5)
Monocytes Absolute: 552 cells/uL (ref 200–950)
NEUTROS ABS: 2070 {cells}/uL (ref 1500–7800)
Neutrophils Relative %: 45 %
PLATELETS: 250 10*3/uL (ref 140–400)
RBC: 3.78 MIL/uL — ABNORMAL LOW (ref 4.20–5.80)
RDW: 15.5 % — ABNORMAL HIGH (ref 11.0–15.0)
WBC: 4.6 10*3/uL (ref 3.8–10.8)

## 2016-04-17 LAB — HEPATIC FUNCTION PANEL
ALT: 24 U/L (ref 9–46)
AST: 26 U/L (ref 10–35)
Albumin: 4 g/dL (ref 3.6–5.1)
Alkaline Phosphatase: 68 U/L (ref 40–115)
BILIRUBIN DIRECT: 0.1 mg/dL (ref <=0.2)
BILIRUBIN INDIRECT: 0.4 mg/dL (ref 0.2–1.2)
BILIRUBIN TOTAL: 0.5 mg/dL (ref 0.2–1.2)
Total Protein: 6.4 g/dL (ref 6.1–8.1)

## 2016-04-17 LAB — BASIC METABOLIC PANEL WITH GFR
BUN: 16 mg/dL (ref 7–25)
CALCIUM: 8.9 mg/dL (ref 8.6–10.3)
CHLORIDE: 105 mmol/L (ref 98–110)
CO2: 26 mmol/L (ref 20–31)
Creat: 1.17 mg/dL (ref 0.70–1.33)
GFR, EST AFRICAN AMERICAN: 80 mL/min (ref >=60)
GFR, Est Non African American: 69 mL/min (ref >=60)
Glucose, Bld: 84 mg/dL (ref 65–99)
Potassium: 4 mmol/L (ref 3.5–5.3)
SODIUM: 141 mmol/L (ref 135–146)

## 2016-04-17 LAB — TSH: TSH: 1.84 mIU/L (ref 0.40–4.50)

## 2016-04-17 LAB — LIPID PANEL
CHOL/HDL RATIO: 4.8 ratio (ref <=5.0)
CHOLESTEROL: 177 mg/dL (ref 125–200)
HDL: 37 mg/dL — ABNORMAL LOW (ref >=40)
LDL Cholesterol: 115 mg/dL (ref <130)
TRIGLYCERIDES: 126 mg/dL (ref <150)
VLDL: 25 mg/dL (ref <30)

## 2016-04-17 LAB — VITAMIN D 25 HYDROXY (VIT D DEFICIENCY, FRACTURES): Vit D, 25-Hydroxy: 37 ng/mL (ref 30–100)

## 2016-04-17 LAB — INSULIN, RANDOM: Insulin: 1.1 u[IU]/mL — ABNORMAL LOW (ref 2.0–19.6)

## 2016-04-17 LAB — HEMOGLOBIN A1C
Hgb A1c MFr Bld: 5.7 % — ABNORMAL HIGH (ref <5.7)
Mean Plasma Glucose: 117 mg/dL

## 2016-04-17 LAB — MAGNESIUM: Magnesium: 2 mg/dL (ref 1.5–2.5)

## 2016-04-18 ENCOUNTER — Telehealth: Payer: Self-pay

## 2016-04-18 NOTE — Telephone Encounter (Signed)
Labs were faxed to [facility] [phone])

## 2016-07-23 ENCOUNTER — Other Ambulatory Visit: Payer: Self-pay

## 2016-07-23 ENCOUNTER — Encounter: Payer: Self-pay | Admitting: Physician Assistant

## 2016-07-23 ENCOUNTER — Ambulatory Visit (INDEPENDENT_AMBULATORY_CARE_PROVIDER_SITE_OTHER): Payer: 59 | Admitting: Physician Assistant

## 2016-07-23 VITALS — BP 120/82 | HR 63 | Temp 97.7°F | Resp 14 | Ht 70.0 in | Wt 168.9 lb

## 2016-07-23 DIAGNOSIS — E782 Mixed hyperlipidemia: Secondary | ICD-10-CM

## 2016-07-23 DIAGNOSIS — R030 Elevated blood-pressure reading, without diagnosis of hypertension: Secondary | ICD-10-CM

## 2016-07-23 DIAGNOSIS — E559 Vitamin D deficiency, unspecified: Secondary | ICD-10-CM | POA: Diagnosis not present

## 2016-07-23 DIAGNOSIS — R7303 Prediabetes: Secondary | ICD-10-CM | POA: Diagnosis not present

## 2016-07-23 DIAGNOSIS — Z79899 Other long term (current) drug therapy: Secondary | ICD-10-CM | POA: Diagnosis not present

## 2016-07-23 LAB — BASIC METABOLIC PANEL WITH GFR
BUN: 14 mg/dL (ref 7–25)
CALCIUM: 9.2 mg/dL (ref 8.6–10.3)
CO2: 25 mmol/L (ref 20–31)
Chloride: 104 mmol/L (ref 98–110)
Creat: 1.07 mg/dL (ref 0.70–1.33)
GFR, EST AFRICAN AMERICAN: 89 mL/min (ref >=60)
GFR, EST NON AFRICAN AMERICAN: 77 mL/min (ref >=60)
GLUCOSE: 85 mg/dL (ref 65–99)
POTASSIUM: 3.9 mmol/L (ref 3.5–5.3)
SODIUM: 140 mmol/L (ref 135–146)

## 2016-07-23 LAB — HEPATIC FUNCTION PANEL
ALK PHOS: 71 U/L (ref 40–115)
ALT: 14 U/L (ref 9–46)
AST: 17 U/L (ref 10–35)
Albumin: 4.3 g/dL (ref 3.6–5.1)
BILIRUBIN DIRECT: 0.1 mg/dL (ref <=0.2)
BILIRUBIN INDIRECT: 0.3 mg/dL (ref 0.2–1.2)
TOTAL PROTEIN: 6.6 g/dL (ref 6.1–8.1)
Total Bilirubin: 0.4 mg/dL (ref 0.2–1.2)

## 2016-07-23 LAB — HEMOGLOBIN A1C
Hgb A1c MFr Bld: 5.2 % (ref <5.7)
Mean Plasma Glucose: 103 mg/dL

## 2016-07-23 LAB — CBC WITH DIFFERENTIAL/PLATELET
BASOS ABS: 0 {cells}/uL (ref 0–200)
Basophils Relative: 0 %
EOS PCT: 2 %
Eosinophils Absolute: 106 cells/uL (ref 15–500)
HCT: 37.6 % — ABNORMAL LOW (ref 38.5–50.0)
HEMOGLOBIN: 12.8 g/dL — AB (ref 13.2–17.1)
LYMPHS ABS: 1802 {cells}/uL (ref 850–3900)
Lymphocytes Relative: 34 %
MCH: 32.2 pg (ref 27.0–33.0)
MCHC: 34 g/dL (ref 32.0–36.0)
MCV: 94.7 fL (ref 80.0–100.0)
MONOS PCT: 14 %
MPV: 9.5 fL (ref 7.5–12.5)
Monocytes Absolute: 742 cells/uL (ref 200–950)
NEUTROS ABS: 2650 {cells}/uL (ref 1500–7800)
Neutrophils Relative %: 50 %
PLATELETS: 258 10*3/uL (ref 140–400)
RBC: 3.97 MIL/uL — AB (ref 4.20–5.80)
RDW: 13.8 % (ref 11.0–15.0)
WBC: 5.3 10*3/uL (ref 3.8–10.8)

## 2016-07-23 LAB — LIPID PANEL
CHOL/HDL RATIO: 3.9 ratio (ref <=5.0)
CHOLESTEROL: 154 mg/dL (ref 125–200)
HDL: 40 mg/dL (ref >=40)
LDL CALC: 105 mg/dL (ref <130)
TRIGLYCERIDES: 46 mg/dL (ref <150)
VLDL: 9 mg/dL (ref <30)

## 2016-07-23 LAB — MAGNESIUM: Magnesium: 2 mg/dL (ref 1.5–2.5)

## 2016-07-23 LAB — TSH: TSH: 1.76 mIU/L (ref 0.40–4.50)

## 2016-07-23 MED ORDER — LEVOCETIRIZINE DIHYDROCHLORIDE 5 MG PO TABS: 5.0000 mg | ORAL_TABLET | Freq: Every evening | ORAL | 3 refills | Status: DC

## 2016-07-23 NOTE — Progress Notes (Signed)
Assessment and Plan:  Hypertension:  -Continue medication,  -monitor blood pressure at home.  -Continue DASH diet.   -Reminder to go to the ER if any CP, SOB, nausea, dizziness, severe HA, changes vision/speech, left arm numbness and tingling, and jaw pain.  Cholesterol: -Continue diet and exercise.  -Check cholesterol.   Pre-diabetes: -Continue diet and exercise.  -Check A1C  Vitamin D Def: -check level -continue medications.   Depression Continue lexapro  Continue diet and meds as discussed. Further disposition pending results of labs.  HPI 55 y.o. male  presents for 3 month follow up with hypertension, hyperlipidemia, prediabetes and vitamin D.   His blood pressure has been controlled at home, today their BP is BP: 120/82.   He does workout. He denies chest pain, shortness of breath, dizziness.   He is on cholesterol medication and denies myalgias. His cholesterol is not at goal. The cholesterol last visit was:   Lab Results  Component Value Date   CHOL 177 04/16/2016   HDL 37 (L) 04/16/2016   LDLCALC 115 04/16/2016   TRIG 126 04/16/2016   CHOLHDL 4.8 04/16/2016    He has been working on diet and exercise for prediabetes, and denies foot ulcerations, hyperglycemia, hypoglycemia , increased appetite, nausea, paresthesia of the feet, polydipsia, polyuria, visual disturbances, vomiting and weight loss. Last A1C in the office was:  Lab Results  Component Value Date   HGBA1C 5.7 (H) 04/16/2016   He was switched from celexa to lexapro and reports better mood. Mom passed 12/12.  Patient is on Vitamin D supplement, 10,000 IU.  Lab Results  Component Value Date   VD25OH 37 04/16/2016       Current Medications:  Current Outpatient Prescriptions on File Prior to Visit  Medication Sig Dispense Refill  . aspirin EC 81 MG tablet Take 81 mg by mouth daily.    . Cholecalciferol (VITAMIN D-3) 5000 UNITS TABS Take 5,000 Units by mouth daily.    . fluticasone (FLONASE) 50  MCG/ACT nasal spray PLACE 2 SPRAYS INTO BOTH NOSTRILS DAILY. 16 g 2  . folic acid (FOLVITE) 1 MG tablet Take 1 mg by mouth.    . Methotrexate, PF, 30 MG/0.6ML SOAJ Inject into the skin.    . Tofacitinib Citrate (XELJANZ PO) Take by mouth as directed.    Marland Kitchen atorvastatin (LIPITOR) 80 MG tablet Take 1/2 to 1 tablet as directed for cholesterol 90 tablet 3  . escitalopram (LEXAPRO) 10 MG tablet Take 1 tablet (10 mg total) by mouth daily. 30 tablet 2   No current facility-administered medications on file prior to visit.     Medical History:  Past Medical History:  Diagnosis Date  . Allergy   . Anemia   . Anxiety   . Arthritis   . Chest pain, non-cardiac   . COPD (chronic obstructive pulmonary disease) ([place])   . Depression   . Diverticulitis   . GERD (gastroesophageal reflux disease)   . Gout   . Hyperlipidemia   . Kidney stones   . Rheumatoid arthritis ([facility])    hands    Allergies:  Allergies  Allergen Reactions  . Humira [Adalimumab] Other (See Comments)    "blacked out", confused     Review of Systems:  Review of Systems  Constitutional: Positive for malaise/fatigue. Negative for chills and fever.  HENT: Negative for congestion, ear discharge and sore throat.   Eyes: Negative.   Respiratory: Negative for cough, shortness of breath and wheezing.   Cardiovascular: Negative for chest  pain, palpitations and leg swelling.  Gastrointestinal: Negative for blood in stool, constipation, diarrhea, heartburn and melena.  Genitourinary: Negative.   Skin: Negative for itching and rash.  Neurological: Negative for dizziness, sensory change and headaches.  Psychiatric/Behavioral: Negative for depression. The patient is not nervous/anxious and does not have insomnia.     Family history- Review and unchanged  Social history- Review and unchanged  Physical Exam: BP 120/82   Pulse 63   Temp 97.7 F (36.5 C)   Resp 14   Ht 5\' 10"  (1.778 m)   Wt 168 lb 14.4 oz (76.6 kg)   SpO2 97%    BMI 24.23 kg/m  Wt Readings from Last 3 Encounters:  07/23/16 168 lb 14.4 oz (76.6 kg)  04/16/16 170 lb (77.1 kg)  01/11/16 169 lb 6.4 oz (76.8 kg)    General Appearance: Well nourished well developed, in no apparent distress. Eyes: PERRLA, EOMs, conjunctiva no swelling or erythema ENT/Mouth: Ear canals normal without obstruction, swelling, erythma, discharge.  TMs normal bilaterally.  Oropharynx moist, clear, without exudate, or postoropharyngeal swelling. Neck: Supple, thyroid normal,no cervical adenopathy  Respiratory: Respiratory effort normal, Breath sounds clear A&P without rhonchi, wheeze, or rale.  No retractions, no accessory usage. Cardio: RRR with no MRGs. Brisk peripheral pulses without edema.  Abdomen: Soft, + BS,  Non tender, no guarding, rebound, hernias, masses. Musculoskeletal: Full ROM, 5/5 strength, Normal gait Skin: Warm, dry without rashes, lesions, ecchymosis.  Neuro: Awake and oriented X 3, Cranial nerves intact. Normal muscle tone, no cerebellar symptoms. Psych: Normal affect, Insight and Judgment appropriate.    Vicie Mutters, PA-C 3:08 PM [facility]

## 2016-07-24 LAB — VITAMIN D 25 HYDROXY (VIT D DEFICIENCY, FRACTURES): Vit D, 25-Hydroxy: 56 ng/mL (ref 30–100)

## 2016-08-29 ENCOUNTER — Other Ambulatory Visit: Payer: Self-pay | Admitting: Physician Assistant

## 2016-08-29 DIAGNOSIS — F329 Major depressive disorder, single episode, unspecified: Secondary | ICD-10-CM

## 2016-08-29 DIAGNOSIS — F32A Depression, unspecified: Secondary | ICD-10-CM

## 2016-08-29 MED ORDER — ESCITALOPRAM OXALATE 10 MG PO TABS: 10.0000 mg | ORAL_TABLET | Freq: Every day | ORAL | 2 refills | Status: DC

## 2016-09-20 ENCOUNTER — Encounter: Payer: Self-pay | Admitting: Internal Medicine

## 2016-09-27 ENCOUNTER — Encounter: Payer: Self-pay | Admitting: Internal Medicine

## 2016-10-16 ENCOUNTER — Encounter: Payer: Self-pay | Admitting: Internal Medicine

## 2016-10-16 ENCOUNTER — Ambulatory Visit (INDEPENDENT_AMBULATORY_CARE_PROVIDER_SITE_OTHER): Payer: 59 | Admitting: Internal Medicine

## 2016-10-16 VITALS — BP 132/78 | HR 58 | Temp 98.2°F | Resp 18 | Ht 69.5 in | Wt 174.0 lb

## 2016-10-16 DIAGNOSIS — Z0000 Encounter for general adult medical examination without abnormal findings: Secondary | ICD-10-CM | POA: Diagnosis not present

## 2016-10-16 DIAGNOSIS — E559 Vitamin D deficiency, unspecified: Secondary | ICD-10-CM | POA: Diagnosis not present

## 2016-10-16 DIAGNOSIS — Z6823 Body mass index (BMI) 23.0-23.9, adult: Secondary | ICD-10-CM

## 2016-10-16 DIAGNOSIS — M0600 Rheumatoid arthritis without rheumatoid factor, unspecified site: Secondary | ICD-10-CM

## 2016-10-16 DIAGNOSIS — Z79899 Other long term (current) drug therapy: Secondary | ICD-10-CM

## 2016-10-16 DIAGNOSIS — Z125 Encounter for screening for malignant neoplasm of prostate: Secondary | ICD-10-CM

## 2016-10-16 DIAGNOSIS — E349 Endocrine disorder, unspecified: Secondary | ICD-10-CM

## 2016-10-16 DIAGNOSIS — R7303 Prediabetes: Secondary | ICD-10-CM

## 2016-10-16 DIAGNOSIS — Z136 Encounter for screening for cardiovascular disorders: Secondary | ICD-10-CM

## 2016-10-16 DIAGNOSIS — E782 Mixed hyperlipidemia: Secondary | ICD-10-CM

## 2016-10-16 DIAGNOSIS — Z130 Encounter for screening for diseases of the blood and blood-forming organs and certain disorders involving the immune mechanism: Secondary | ICD-10-CM

## 2016-10-16 DIAGNOSIS — R030 Elevated blood-pressure reading, without diagnosis of hypertension: Secondary | ICD-10-CM | POA: Diagnosis not present

## 2016-10-16 DIAGNOSIS — Z0001 Encounter for general adult medical examination with abnormal findings: Secondary | ICD-10-CM

## 2016-10-16 LAB — PSA: PSA: 0.4 ng/mL (ref <=4.0)

## 2016-10-16 LAB — CBC WITH DIFFERENTIAL/PLATELET
BASOS ABS: 60 {cells}/uL (ref 0–200)
BASOS PCT: 1 %
EOS ABS: 60 {cells}/uL (ref 15–500)
Eosinophils Relative: 1 %
HEMATOCRIT: 40 % (ref 38.5–50.0)
Hemoglobin: 13.3 g/dL (ref 13.2–17.1)
LYMPHS PCT: 29 %
Lymphs Abs: 1740 cells/uL (ref 850–3900)
MCH: 32 pg (ref 27.0–33.0)
MCHC: 33.3 g/dL (ref 32.0–36.0)
MCV: 96.2 fL (ref 80.0–100.0)
MONO ABS: 720 {cells}/uL (ref 200–950)
MONOS PCT: 12 %
MPV: 9.6 fL (ref 7.5–12.5)
Neutro Abs: 3420 cells/uL (ref 1500–7800)
Neutrophils Relative %: 57 %
PLATELETS: 294 10*3/uL (ref 140–400)
RBC: 4.16 MIL/uL — ABNORMAL LOW (ref 4.20–5.80)
RDW: 14.6 % (ref 11.0–15.0)
WBC: 6 10*3/uL (ref 3.8–10.8)

## 2016-10-16 LAB — HEPATIC FUNCTION PANEL
ALBUMIN: 4.4 g/dL (ref 3.6–5.1)
ALT: 11 U/L (ref 9–46)
AST: 20 U/L (ref 10–35)
Alkaline Phosphatase: 81 U/L (ref 40–115)
BILIRUBIN TOTAL: 0.5 mg/dL (ref 0.2–1.2)
Bilirubin, Direct: 0.1 mg/dL (ref <=0.2)
Indirect Bilirubin: 0.4 mg/dL (ref 0.2–1.2)
Total Protein: 7 g/dL (ref 6.1–8.1)

## 2016-10-16 LAB — BASIC METABOLIC PANEL WITH GFR
BUN: 15 mg/dL (ref 7–25)
CHLORIDE: 102 mmol/L (ref 98–110)
CO2: 24 mmol/L (ref 20–31)
CREATININE: 1.04 mg/dL (ref 0.70–1.33)
Calcium: 9.4 mg/dL (ref 8.6–10.3)
GFR, Est African American: >89 mL/min (ref >=60)
GFR, Est Non African American: 79 mL/min (ref >=60)
GLUCOSE: 90 mg/dL (ref 65–99)
POTASSIUM: 4 mmol/L (ref 3.5–5.3)
Sodium: 135 mmol/L (ref 135–146)

## 2016-10-16 LAB — VITAMIN B12: Vitamin B-12: 397 pg/mL (ref 200–1100)

## 2016-10-16 LAB — MAGNESIUM: MAGNESIUM: 2.1 mg/dL (ref 1.5–2.5)

## 2016-10-16 LAB — LIPID PANEL
Cholesterol: 198 mg/dL (ref <200)
HDL: 43 mg/dL (ref >40)
LDL Cholesterol: 138 mg/dL — ABNORMAL HIGH (ref <100)
Total CHOL/HDL Ratio: 4.6 Ratio (ref <5.0)
Triglycerides: 85 mg/dL (ref <150)
VLDL: 17 mg/dL (ref <30)

## 2016-10-16 LAB — TSH: TSH: 1.08 m[IU]/L (ref 0.40–4.50)

## 2016-10-16 LAB — IRON AND TIBC
%SAT: 26 % (ref 15–60)
IRON: 82 ug/dL (ref 50–180)
TIBC: 317 ug/dL (ref 250–425)
UIBC: 235 ug/dL (ref 125–400)

## 2016-10-16 LAB — HEMOGLOBIN A1C
HEMOGLOBIN A1C: 5.2 % (ref <5.7)
Mean Plasma Glucose: 103 mg/dL

## 2016-10-16 MED ORDER — PREDNISONE 20 MG PO TABS: ORAL_TABLET | ORAL | 0 refills | Status: DC

## 2016-10-16 NOTE — Progress Notes (Signed)
Complete Physical  Assessment and Plan:   1. Elev BP w/o Dx /o HTN -cont meds -dash diet - Urinalysis, Routine w reflex microscopic (not at [facility]) - Microalbumin / creatinine urine ratio - EKG 12-Lead - TSH  2. Hyperlipidemia -cont meds - Lipid panel  3. Prediabetes  - Hemoglobin A1c - Insulin, random  4. Vitamin D deficiency -cont Vit D - VITAMIN D 25 Hydroxy (Vit-D Deficiency, Fractures)  5. Seronegative rheumatoid arthritis ([place]) -cont meds -on dmards -send labs to rheumatologist  6. Body mass index (BMI) of 23.0-23.9 in adult -well controlled  7. Encounter for general adult medical examination with abnormal findings  - CBC with Differential/Platelet - BASIC METABOLIC PANEL WITH GFR - Hepatic function panel - Magnesium  8. Screening for prostate cancer  - PSA  9. Screening for deficiency anemia - Iron and TIBC - Vitamin B12  10. Testosterone deficiency - Testosterone   Discussed med's effects and SE's. Screening labs and tests as requested with regular follow-up as recommended.  HPI Patient presents for a complete physical.   His blood pressure has been controlled at home, today their BP is BP: 132/78 He does not workout. He denies chest pain, shortness of breath, dizziness.   He is on cholesterol medication and denies myalgias. His cholesterol is at goal. The cholesterol last visit was:   Lab Results  Component Value Date   CHOL 154 07/23/2016   HDL 40 07/23/2016   LDLCALC 105 07/23/2016   TRIG 46 07/23/2016   CHOLHDL 3.9 07/23/2016    He has been working on diet and exercise for prediabetes, he is on bASA, he is on ACE/ARB and denies foot ulcerations, hyperglycemia, hypoglycemia , increased appetite, nausea, paresthesia of the feet, polydipsia, polyuria, visual disturbances, vomiting and weight loss. Last A1C in the office was:  Lab Results  Component Value Date   HGBA1C 5.2 07/23/2016    Patient is on Vitamin D supplement.   Lab  Results  Component Value Date   VD25OH 56 07/23/2016      Last PSA was: Lab Results  Component Value Date   PSA 0.57 10/11/2015  .  Denies BPH symptoms daytime frequency, double voiding, dysuria, hematuria, hesitancy, incontinence, intermittency, nocturia, sensation of incomplete bladder emptying, suprapubic pain, urgency or weak urinary stream.  He reports that his hearing has not been good. It has been worsening in the last month.  It has been coming on slow.  He has not been nasally congestion, has not had pain or clicking in the ears.  His wife is reporting that he is yelling at her.   He also has a place on his scalp.  It keeps scabbing up and healing.  It is sore.  Current Medications:  Current Outpatient Prescriptions on File Prior to Visit  Medication Sig Dispense Refill  . aspirin EC 81 MG tablet Take 81 mg by mouth daily.    Marland Kitchen atorvastatin (LIPITOR) 80 MG tablet Take 1/2 to 1 tablet as directed for cholesterol 90 tablet 3  . Cholecalciferol (VITAMIN D-3) 5000 UNITS TABS Take 5,000 Units by mouth daily.    Marland Kitchen escitalopram (LEXAPRO) 10 MG tablet Take 1 tablet (10 mg total) by mouth daily. 30 tablet 2  . fluticasone (FLONASE) 50 MCG/ACT nasal spray PLACE 2 SPRAYS INTO BOTH NOSTRILS DAILY. 16 g 2  . folic acid (FOLVITE) 1 MG tablet Take 1 mg by mouth.    . levocetirizine (XYZAL) 5 MG tablet Take 1 tablet (5 mg total) by mouth  every evening. 90 tablet 3  . Methotrexate, PF, 30 MG/0.6ML SOAJ Inject into the skin.    . Tofacitinib Citrate (XELJANZ PO) Take by mouth as directed.     No current facility-administered medications on file prior to visit.     Health Maintenance:  Immunization History  Administered Date(s) Administered  . DTaP 06/04/2011  . Hepatitis B 11/26/2010  . Influenza Split 09/02/2014  . Influenza Whole 09/17/2013  . Influenza, Seasonal, Injecte, Preservative Fre 10/11/2015  . PPD Test 10/11/2015    Tetanus: 2012 Flu vaccine:2017 Colonoscopy:  2016  Patient Care Team: Unk Pinto, MD as PCP - General (Internal Medicine)  Allergies:  Allergies  Allergen Reactions  . Humira [Adalimumab] Other (See Comments)    "blacked out", confused    Medical History:  Past Medical History:  Diagnosis Date  . Allergy   . Anemia   . Anxiety   . Arthritis   . Chest pain, non-cardiac   . COPD (chronic obstructive pulmonary disease) ([place])   . Depression   . Diverticulitis   . GERD (gastroesophageal reflux disease)   . Gout   . Hyperlipidemia   . Kidney stones   . Rheumatoid arthritis ([place])    hands    Surgical History:  Past Surgical History:  Procedure Laterality Date  . COLONOSCOPY    . FINGER SURGERY    . KNEE SURGERY     right  . ROTATOR CUFF REPAIR  1996   left    Family History:  Family History  Problem Relation Age of Onset  . Hyperlipidemia Mother   . COPD Mother   . Heart failure Mother   . Cancer Mother     breast  . Hyperlipidemia Brother   . Stroke Maternal Grandfather   . COPD Maternal Grandfather     colon  . Colon cancer Maternal Grandfather 12    died at 60  . Esophageal cancer Neg Hx   . Stomach cancer Neg Hx   . Rectal cancer Neg Hx   . Hypertension Father     Social History:   Social History  Substance Use Topics  . Smoking status: Former Smoker    Packs/day: 2.00    Types: Cigarettes    Quit date: 07/03/2010  . Smokeless tobacco: Never Used  . Alcohol use No    Review of Systems:  Review of Systems  Constitutional: Negative for chills, fever and malaise/fatigue.  HENT: Negative for congestion, ear pain and sore throat.   Eyes: Negative.   Respiratory: Negative for cough, shortness of breath and wheezing.   Cardiovascular: Negative for chest pain, palpitations and leg swelling.  Gastrointestinal: Negative for abdominal pain, blood in stool, constipation, diarrhea, heartburn and melena.  Genitourinary: Negative.   Skin: Negative.   Neurological: Negative for dizziness,  sensory change, loss of consciousness and headaches.  Psychiatric/Behavioral: Negative for depression. The patient is not nervous/anxious and does not have insomnia.     Physical Exam: Estimated body mass index is 25.33 kg/m as calculated from the following:   Height as of this encounter: 5' 9.5" (1.765 m).   Weight as of this encounter: 174 lb (78.9 kg). BP 132/78   Pulse (!) 58   Temp 98.2 F (36.8 C) (Temporal)   Resp 18   Ht 5' 9.5" (1.765 m)   Wt 174 lb (78.9 kg)   BMI 25.33 kg/m   General Appearance: Well nourished, in no apparent distress.  Eyes: PERRLA, EOMs, conjunctiva no swelling or erythema ENT/Mouth:  Ear canals clear bilaterally with no erythema, swelling, discharge.  TMs normal bilaterally with no erythema, bulging, or retractions.  Oropharynx clear and moist with no exudate, swelling, or erythema.  Dentition normal.   Neck: Supple, thyroid normal. No bruits, JVD, cervical adenopathy Respiratory: Respiratory effort normal, BS equal bilaterally without rales, rhonchi, wheezing or stridor.  Cardio: RRR without murmurs, rubs or gallops. Brisk peripheral pulses without edema.  Chest: symmetric, with normal excursions Abdomen: Soft, nontender, no guarding, rebound, hernias, masses, or organomegaly. Musculoskeletal: Full ROM all peripheral extremities,5/5 strength, and normal gait.  Skin: Warm, dry without rashes, lesions, ecchymosis. Neuro: A&Ox3, Cranial nerves intact, reflexes equal bilaterally. Normal muscle tone, no cerebellar symptoms. Sensation intact.  Psych: Normal affect, Insight and Judgment appropriate.   EKG: WNL no changes.  Over 40 minutes of exam, counseling, chart review and critical decision making was performed  Starlyn Skeans 2:52 PM [facility]

## 2016-10-17 LAB — URINALYSIS, ROUTINE W REFLEX MICROSCOPIC
Bilirubin Urine: NEGATIVE
Glucose, UA: NEGATIVE
Hgb urine dipstick: NEGATIVE
KETONES UR: NEGATIVE
LEUKOCYTES UA: NEGATIVE
NITRITE: NEGATIVE
PH: 5.5 (ref 5.0–8.0)
Protein, ur: NEGATIVE
SPECIFIC GRAVITY, URINE: 1.01 (ref 1.001–1.035)

## 2016-10-17 LAB — MICROALBUMIN / CREATININE URINE RATIO
Creatinine, Urine: 66 mg/dL (ref 20–370)
MICROALB UR: 0.2 mg/dL
MICROALB/CREAT RATIO: 3 ug/mg{creat} (ref <30)

## 2016-10-17 LAB — INSULIN, RANDOM: Insulin: 1 u[IU]/mL — ABNORMAL LOW (ref 2.0–19.6)

## 2016-10-17 LAB — TESTOSTERONE: Testosterone: 267 ng/dL (ref 250–827)

## 2016-10-17 LAB — VITAMIN D 25 HYDROXY (VIT D DEFICIENCY, FRACTURES): VIT D 25 HYDROXY: 52 ng/mL (ref 30–100)

## 2016-10-30 ENCOUNTER — Encounter: Payer: Self-pay | Admitting: Internal Medicine

## 2016-10-30 ENCOUNTER — Ambulatory Visit (INDEPENDENT_AMBULATORY_CARE_PROVIDER_SITE_OTHER): Payer: 59 | Admitting: Internal Medicine

## 2016-10-30 VITALS — BP 146/82 | HR 68 | Temp 97.5°F | Resp 16 | Ht 69.5 in | Wt 176.6 lb

## 2016-10-30 DIAGNOSIS — L570 Actinic keratosis: Secondary | ICD-10-CM | POA: Diagnosis not present

## 2016-10-30 DIAGNOSIS — R030 Elevated blood-pressure reading, without diagnosis of hypertension: Secondary | ICD-10-CM

## 2016-10-30 NOTE — Patient Instructions (Signed)
Actinic Keratosis An actinic keratosis is a precancerous growth on the skin. This means that it could develop into skin cancer if it is not treated. About 1% of these growths (actinic keratoses) turn into skin cancer within one year if they are not treated. It is important to have all of these growths evaluated to determine the best treatment approach. What are the causes? This condition is caused by getting too much ultraviolet (UV) radiation from the sun or other UV light sources. What increases the risk? The following factors may make you more likely to develop this condition:  Having light-colored skin and blue eyes.  Having blonde or red hair.  Spending a lot of time in the sun.  Inadequate skin protection when outdoors. This may include:  Not using sunscreen properly.  Not covering up skin that is exposed to sunlight.  Aging. The risk of developing an actinic keratosis increases with age. What are the signs or symptoms? Actinic keratoses look like scaly, rough spots of skin.They can be as small as a pinhead or as big as a quarter. They may itch, hurt, or feel sensitive. In most cases, the growths become red. In some cases, they may be skin-colored, light tan, dark tan, pink, or a combination of any of these colors. There may be a small piece of pink or gray skin (skin tag) growing from the actinic keratosis. In some cases, it may be easier to notice actinic keratoses by feeling them, rather than seeing them. Actinic keratoses appear most often on areas of skin that get a lot of sun exposure, including the scalp, face, ears, lips, upper back, forearms, and the backs of the hands. Sometimes, actinic keratoses disappear, but many reappear a few days to a few weeks later. How is this diagnosed? This condition is usually diagnosed with a physical exam. A tissue sample may be removed from the actinic keratosis and examined under a microscope (biopsy). How is this treated?   Treatment for  this condition may include:  Scraping off the actinic keratosis (curettage).  Freezing the actinic keratosis with liquid nitrogen (cryosurgery). This causes the growth to eventually fall off the skin.  Applying medicated creams or gels to destroy the cells in the growth.  Applying chemicals to the actinic keratosis to make the outer layers of skin peel off (chemical peel).  Photodynamic therapy. In this procedure, medicated cream is applied to the actinic keratosis. This cream increases your skin's sensitivity to light. Then, a strong light is aimed at the actinic keratosis to destroy cells in the growth. Follow these instructions at home: Skin care  Apply cool, wet cloths (cool compresses) to the affected areas.  Do not scratch your skin.  Check your skin regularly for any growths, especially growths that:  Start to itch or bleed.  Change in size, shape, or color. Caring for the treated area  Keep the treated area clean and dry as told by your health care provider.  Do not apply any medicine, cream, or lotion to the treated area unless your health care provider tells you to do that.  Do not pick at blisters or try to break them open. This can cause infection and scarring.  If you have red or irritated skin after treatment, follow instructions from your health care provider about how to take care of the treated area. Make sure you:  Wash your hands with soap and water before you change your bandage (dressing). If soap and water are not available, use hand  hand sanitizer. ? Change your dressing as told by your health care provider.  If you have red or irritated skin after treatment, check your treated area every day for signs of infection. Check for: ? Swelling, pain, or more redness. ? Fluid or blood. ? Warmth. ? Pus or a bad smell. General instructions  Take over-the-counter and prescription medicines only as told by your health care provider.  Return to your normal  activities as told by your health care provider. Ask your health care provider what activities are safe for you.  Do not use any tobacco products, such as cigarettes, chewing tobacco, and e-cigarettes. If you need help quitting, ask your health care provider.  Have a skin exam done every year by a health care provider who is a skin conditions specialist (dermatologist).  Keep all follow-up visits as told by your health care provider. This is important. How is this prevented?  Do not get sunburns.  Try to avoid the sun between 10:00 a.m. and 4:00 p.m. This is when the UV light is the strongest.  Use a sunscreen or sunblock with SPF 30 (sun protection factor 30) or greater.  Apply sunscreen before you are exposed to sunlight, and reapply periodically as often as directed by the instructions on the sunscreen container.  Always wear sunglasses that have UV protection, and always wear hats and clothing to protect your skin from sunlight.  When possible, avoid medicines that increase your sensitivity to sunlight. These include: ? Certain antibiotic medicines. ? Certain water pills (diuretics). ? Certain prescription medicines that are used to treat acne (retinoids).  Do not use tanning beds or other indoor tanning devices. Contact a health care provider if:  You notice any changes or new growths on your skin.  You have swelling, pain, or more redness around your treated area.  You have fluid or blood coming from your treated area.  Your treated area feels warm to the touch.  You have pus or a bad smell coming from your treated area.  You have a fever.  You have a blister that becomes large and painful.   

## 2016-10-30 NOTE — Progress Notes (Signed)
Subjective:     Patient ID: Edwin Webster, male   DOB: 04/26/1961, 55 y.o.   MRN: [id number]  HPI  This nice 55 yo MWM with hx/o elevated labile HTN, HLD, PreDM presents fo recheck and initial BP is borderline elevated at 146/82 dropping to 138/78. Hypertensive systems review is negative. He alsoi has concerns about a small area of skin at his R frontal scalpline that recurrently ulceratyes, scabs over, heals and recycles.   Medication Sig  . aspirin EC 81 MG tablet Take 81 mg by mouth daily.  Marland Kitchen atorvastatin (LIPITOR) 80 MG tablet Take 1/2 to 1 tablet as directed for cholesterol  . VITAMIN D 5000 UNITS  Take 5,000 Units by mouth daily.  Marland Kitchen escitalopram  10 MG  Take 1 tablet (10 mg total) by mouth daily.  Marland Kitchen FLONASE nasal spray PLACE 2 SPRAYS INTO BOTH NOSTRILS DAILY.  . folic acid  1 MG  Take 1 mg by mouth.  Harlow Ohms 5 MG  Take 1 tablet (5 mg total) by mouth every evening.  . Methotrexate 30 MG/0.6ML Inject into the skin.  . Tofacitinib / XELJANZ  Take by mouth as directed.   Allergies  Allergen Reactions  . Humira [Adalimumab] Other (See Comments)    "blacked out", confused   Past Medical History:  Diagnosis Date  . Allergy   . Anemia   . Anxiety   . Arthritis   . Chest pain, non-cardiac   . COPD (chronic obstructive pulmonary disease) ([place])   . Depression   . Diverticulitis   . GERD (gastroesophageal reflux disease)   . Gout   . Hyperlipidemia   . Kidney stones   . Rheumatoid arthritis ([place])    hands   Review of Systems 10 point systems review negative except as above.     Objective:   Physical Exam  BP  146/82 -> reck'd 138/78   P 68   T 97.5 F    R 16   Ht 5' 9.5"   Wt 176 lb 9.6 oz   BMI 25.71  In No Distress  HEENT - WNL. Neck - supple. Nl Thyroid. Carotids 2+ & No bruits, nodes, JVD Chest - Clear. Cor - Nl HS. RRR w/o sig MGR.  No edema. MS- FROM. Gait Nl. Neuro - Nl w/o focal abnormalities. Skin - approx 13 x 15 mm circumscribed  patch of crusted skin  at the lateral R frontal scalp line . After informed consent the area designated was treated with liq. Nitrogen by triple freeze/thaw technique/. Patient was informed of expected post treatment response and wound care.     Assessment:   1. Elev BP w/o Dx /o HTN   2. Actinic keratosis of right temple  Plan:   Cryosurgery (17000) as above

## 2016-11-21 ENCOUNTER — Encounter: Payer: Self-pay | Admitting: Internal Medicine

## 2016-12-04 ENCOUNTER — Other Ambulatory Visit: Payer: Self-pay

## 2016-12-04 DIAGNOSIS — F32A Depression, unspecified: Secondary | ICD-10-CM

## 2016-12-04 DIAGNOSIS — F329 Major depressive disorder, single episode, unspecified: Secondary | ICD-10-CM

## 2016-12-04 MED ORDER — ESCITALOPRAM OXALATE 10 MG PO TABS: 10.0000 mg | ORAL_TABLET | Freq: Every day | ORAL | 1 refills | Status: DC

## 2016-12-05 ENCOUNTER — Other Ambulatory Visit: Payer: Self-pay

## 2016-12-05 DIAGNOSIS — F329 Major depressive disorder, single episode, unspecified: Secondary | ICD-10-CM

## 2016-12-05 DIAGNOSIS — F32A Depression, unspecified: Secondary | ICD-10-CM

## 2016-12-05 MED ORDER — ESCITALOPRAM OXALATE 10 MG PO TABS: 10.0000 mg | ORAL_TABLET | Freq: Every day | ORAL | 1 refills | Status: DC

## 2016-12-28 ENCOUNTER — Ambulatory Visit (INDEPENDENT_AMBULATORY_CARE_PROVIDER_SITE_OTHER): Payer: 59 | Admitting: Physician Assistant

## 2016-12-28 ENCOUNTER — Encounter: Payer: Self-pay | Admitting: Physician Assistant

## 2016-12-28 VITALS — BP 132/78 | HR 70 | Temp 97.7°F | Resp 16 | Ht 69.5 in | Wt 180.4 lb

## 2016-12-28 DIAGNOSIS — J0140 Acute pansinusitis, unspecified: Secondary | ICD-10-CM | POA: Diagnosis not present

## 2016-12-28 MED ORDER — PROMETHAZINE-DM 6.25-15 MG/5ML PO SYRP: 5.0000 mL | ORAL_SOLUTION | Freq: Four times a day (QID) | ORAL | 1 refills | Status: DC | PRN

## 2016-12-28 MED ORDER — AZITHROMYCIN 250 MG PO TABS: ORAL_TABLET | ORAL | 1 refills | Status: AC

## 2016-12-28 MED ORDER — PREDNISONE 20 MG PO TABS: ORAL_TABLET | ORAL | 0 refills | Status: DC

## 2016-12-28 NOTE — Patient Instructions (Signed)
Make sure you are on an allergy pill, see below for more details. Please take the prednisone as directed below, this is NOT an antibiotic so you do NOT have to finish it. You can take it for a few days and stop it if you are doing better.   Please take the prednisone to help decrease inflammation and therefore decrease symptoms. Take it it with food to avoid GI upset. It can cause increased energy but on the other hand it can make it hard to sleep at night so please take it AT NIGHT WITH DINNER, it takes 8-12 hours to start working so it will NOT affect your sleeping if you take it at night with your food!!  If you are diabetic it will increase your sugars so decrease carbs and monitor your sugars closely.     HOW TO TREAT VIRAL COUGH AND COLD SYMPTOMS:  -Symptoms usually last at least 1 week with the worst symptoms being around day 4.  - colds usually start with a sore throat and end with a cough, and the cough can take 2 weeks to get better.  -No antibiotics are needed for colds, flu, sore throats, cough, bronchitis UNLESS symptoms are longer than 7 days OR if you are getting better then get drastically worse.  -There are a lot of combination medications (Dayquil, Nyquil, Vicks 44, tyelnol cold and sinus, ETC). Please look at the ingredients on the back so that you are treating the correct symptoms and not doubling up on medications/ingredients.    Medicines you can use  Nasal congestion  - pseudoephedrine (Sudafed)- behind the counter, do not use if you have high blood pressure, medicine that have -D in them.  - phenylephrine (Sudafed PE) -Dextormethorphan + chlorpheniramine (Coridcidin HBP)- okay if you have high blood pressure -Oxymetazoline (Afrin) nasal spray- LIMIT to 3 days -Saline nasal spray -Neti pot (used distilled or bottled water)  Ear pain/congestion  -pseudoephedrine (sudafed) - Nasonex/flonase nasal spray  Fever  -Acetaminophen (Tyelnol) -Ibuprofen (Advil, motrin,  aleve)  Sore Throat  -Acetaminophen (Tyelnol) -Ibuprofen (Advil, motrin, aleve) -Drink a lot of water -Gargle with salt water - Rest your voice (don't talk) -Throat sprays -Cough drops  Body Aches  -Acetaminophen (Tyelnol) -Ibuprofen (Advil, motrin, aleve)  Headache  -Acetaminophen (Tyelnol) -Ibuprofen (Advil, motrin, aleve) - Exedrin, Exedrin Migraine  Allergy symptoms (cough, sneeze, runny nose, itchy eyes) -Claritin or loratadine cheapest but likely the weakest  -Zyrtec or certizine at night because it can make you sleepy -The strongest is allegra or fexafinadine  Cheapest at [employer], [employer], [employer]  Cough  -Dextromethorphan (Delsym)- medicine that has DM in it -Guafenesin (Mucinex/Robitussin) - cough drops - drink lots of water  Chest Congestion  -Guafenesin (Mucinex/Robitussin)  Red Itchy Eyes  - Naphcon-A  Upset Stomach  - Bland diet (nothing spicy, greasy, fried, and high acid foods like tomatoes, oranges, berries) -OKAY- cereal, bread, soup, crackers, rice -Eat smaller more frequent meals -reduce caffeine, no alcohol -Loperamide (Imodium-AD) if diarrhea -Prevacid for heart burn  General health when sick  -Hydration -wash your hands frequently -keep surfaces clean -change pillow cases and sheets often -Get fresh air but do not exercise strenuously -Vitamin D, double up on it - Vitamin C -Zinc       

## 2016-12-28 NOTE — Progress Notes (Signed)
   Subjective:    Patient ID: MIN SARRA, male    DOB: November 04, 1961, 56 y.o.   MRN: [id number]  HPI 56 y.o. WM with history of RA on MTX presents with sinus symptoms x 10 days after getting over the influenza. Has had sinus pain, sinus pressure, chills, congestion. Cough with yellow mucus, no SOB, wheezing CP, HA, dizziness, neck pain.  Blood pressure 132/78, pulse 70, temperature 97.7 F (36.5 C), resp. rate 16, height 5' 9.5" (1.765 m), weight 180 lb 6.4 oz (81.8 kg), SpO2 97 %.  Medications Current Outpatient Prescriptions on File Prior to Visit  Medication Sig  . aspirin EC 81 MG tablet Take 81 mg by mouth daily.  . Cholecalciferol (VITAMIN D-3) 5000 UNITS TABS Take 5,000 Units by mouth daily.  Marland Kitchen escitalopram (LEXAPRO) 10 MG tablet Take 1 tablet (10 mg total) by mouth daily.  . fluticasone (FLONASE) 50 MCG/ACT nasal spray PLACE 2 SPRAYS INTO BOTH NOSTRILS DAILY.  . folic acid (FOLVITE) 1 MG tablet Take 1 mg by mouth.  . levocetirizine (XYZAL) 5 MG tablet Take 1 tablet (5 mg total) by mouth every evening.  . Methotrexate, PF, 30 MG/0.6ML SOAJ Inject into the skin.  . Tofacitinib Citrate (XELJANZ PO) Take by mouth as directed.  Marland Kitchen atorvastatin (LIPITOR) 80 MG tablet Take 1/2 to 1 tablet as directed for cholesterol   No current facility-administered medications on file prior to visit.     Problem list He has Hyperlipidemia; Elev BP w/o Dx /o HTN; Vitamin D deficiency; Medication management; Seronegative rheumatoid arthritis ([place]); Prediabetes; Body mass index (BMI) of 23.0-23.9 in adult; History of arthroscopy of knee; and Gonalgia on his problem list.  Review of Systems  Constitutional: Positive for chills. Negative for diaphoresis and fever.  HENT: Positive for congestion, sinus pain, sinus pressure and sore throat. Negative for ear pain, sneezing, trouble swallowing and voice change.   Eyes: Negative.   Respiratory: Positive for cough. Negative for chest tightness, shortness of  breath and wheezing.   Cardiovascular: Negative.   Gastrointestinal: Negative.   Genitourinary: Negative.   Musculoskeletal: Negative for neck pain.  Neurological: Negative.  Negative for headaches.       Objective:   Physical Exam  Constitutional: He appears well-developed and well-nourished.  HENT:  Head: Normocephalic and atraumatic.  Right Ear: External ear normal.  Left Ear: External ear normal.  Nose: Right sinus exhibits maxillary sinus tenderness. Left sinus exhibits maxillary sinus tenderness.  Mouth/Throat: Oropharynx is clear and moist.  Eyes: Conjunctivae are normal. Pupils are equal, round, and reactive to light.  Neck: Normal range of motion. Neck supple.  Cardiovascular: Normal rate, regular rhythm and normal heart sounds.   Pulmonary/Chest: Effort normal and breath sounds normal. He has no wheezes.  Abdominal: Soft. Bowel sounds are normal.  Lymphadenopathy:    He has cervical adenopathy.  Skin: Skin is warm and dry.      Assessment & Plan:  1. Acute pansinusitis, recurrence not specified - predniSONE (DELTASONE) 20 MG tablet; 2 tablets daily for 3 days, 1 tablet daily for 4 days.  Dispense: 10 tablet; Refill: 0 - azithromycin (ZITHROMAX) 250 MG tablet; Take 2 tablets (500 mg) on  Day 1,  followed by 1 tablet (250 mg) once daily on Days 2 through 5.  Dispense: 6 each; Refill: 1 - promethazine-dextromethorphan (PROMETHAZINE-DM) 6.25-15 MG/5ML syrup; Take 5 mLs by mouth 4 (four) times daily as needed for cough.  Dispense: 240 mL; Refill: 1

## 2017-04-15 NOTE — Progress Notes (Signed)
Assessment and Plan:  Hypertension:  -Continue medication,  -monitor blood pressure at home.  -Continue DASH diet.   -Reminder to go to the ER if any CP, SOB, nausea, dizziness, severe HA, changes vision/speech, left arm numbness and tingling, and jaw pain.  Cholesterol: -Continue diet and exercise.  -Check cholesterol.   Pre-diabetes: -Continue diet and exercise.  -Check A1C  Vitamin D Def: -check level -continue medications.   Depression Continue lexapro 20mg  daily  SSC 54fu cream given to patient with instruction, understands that he will have redness at the site will call if any issues.   Continue diet and meds as discussed. Further disposition pending results of labs. No future appointments.  HPI 56 y.o. male  presents for 3 month follow up with hypertension, hyperlipidemia, prediabetes and vitamin D.   His blood pressure has been controlled at home, today their BP is BP: 118/82.   He does workout. He denies chest pain, shortness of breath, dizziness.   He is on cholesterol medication and denies myalgias. His cholesterol is not at goal. The cholesterol last visit was:   Lab Results  Component Value Date   CHOL 198 10/16/2016   HDL 43 10/16/2016   LDLCALC 138 (H) 10/16/2016   TRIG 85 10/16/2016   CHOLHDL 4.6 10/16/2016    He has been working on diet and exercise for prediabetes, and denies foot ulcerations, hyperglycemia, hypoglycemia , increased appetite, nausea, paresthesia of the feet, polydipsia, polyuria, visual disturbances, vomiting and weight loss. Last A1C in the office was:  Lab Results  Component Value Date   HGBA1C 5.2 10/16/2016   He was switched from celexa to lexapro and reports better mood, but better on the 20mg . Mom passed 11/06/2016.  Patient is on Vitamin D supplement, 10,000 IU.  Lab Results  Component Value Date   VD25OH 52 10/16/2016       Current Medications:  Current Outpatient Prescriptions on File Prior to Visit  Medication Sig  Dispense Refill  . aspirin EC 81 MG tablet Take 81 mg by mouth daily.    . Cholecalciferol (VITAMIN D-3) 5000 UNITS TABS Take 5,000 Units by mouth daily.    Marland Kitchen escitalopram (LEXAPRO) 10 MG tablet Take 1 tablet (10 mg total) by mouth daily. 90 tablet 1  . fluticasone (FLONASE) 50 MCG/ACT nasal spray PLACE 2 SPRAYS INTO BOTH NOSTRILS DAILY. 16 g 2  . folic acid (FOLVITE) 1 MG tablet Take 1 mg by mouth.    . levocetirizine (XYZAL) 5 MG tablet Take 1 tablet (5 mg total) by mouth every evening. 90 tablet 3  . Methotrexate, PF, 30 MG/0.6ML SOAJ Inject into the skin.    . Tofacitinib Citrate (XELJANZ PO) Take by mouth as directed.    Marland Kitchen atorvastatin (LIPITOR) 80 MG tablet Take 1/2 to 1 tablet as directed for cholesterol 90 tablet 3   No current facility-administered medications on file prior to visit.     Medical History:  Past Medical History:  Diagnosis Date  . Allergy   . Anemia   . Anxiety   . Arthritis   . Chest pain, non-cardiac   . COPD (chronic obstructive pulmonary disease) ([place])   . Depression   . Diverticulitis   . GERD (gastroesophageal reflux disease)   . Gout   . Hyperlipidemia   . Kidney stones   . Rheumatoid arthritis ([facility])    hands    Allergies:  Allergies  Allergen Reactions  . Humira [Adalimumab] Other (See Comments)    "blacked out",  confused     Review of Systems:  Review of Systems  Constitutional: Positive for malaise/fatigue. Negative for chills and fever.  HENT: Negative for congestion, ear discharge and sore throat.   Eyes: Negative.   Respiratory: Negative for cough, shortness of breath and wheezing.   Cardiovascular: Negative for chest pain, palpitations and leg swelling.  Gastrointestinal: Negative for blood in stool, constipation, diarrhea, heartburn and melena.  Genitourinary: Negative.   Skin: Negative for itching and rash.  Neurological: Negative for dizziness, sensory change and headaches.  Psychiatric/Behavioral: Negative for depression.  The patient is not nervous/anxious and does not have insomnia.     Family history- Review and unchanged  Social history- Review and unchanged  Physical Exam: BP 118/82   Pulse 66   Temp 97.5 F (36.4 C)   Resp 16   Ht 5' 9.5" (1.765 m)   Wt 173 lb 3.2 oz (78.6 kg)   SpO2 97%   BMI 25.21 kg/m  Wt Readings from Last 3 Encounters:  04/16/17 173 lb 3.2 oz (78.6 kg)  12/28/16 180 lb 6.4 oz (81.8 kg)  10/30/16 176 lb 9.6 oz (80.1 kg)    General Appearance: Well nourished well developed, in no apparent distress. Eyes: PERRLA, EOMs, conjunctiva no swelling or erythema ENT/Mouth: Ear canals normal without obstruction, swelling, erythma, discharge.  TMs normal bilaterally.  Oropharynx moist, clear, without exudate, or postoropharyngeal swelling. Neck: Supple, thyroid normal,no cervical adenopathy  Respiratory: Respiratory effort normal, Breath sounds clear A&P without rhonchi, wheeze, or rale.  No retractions, no accessory usage. Cardio: RRR with no MRGs. Brisk peripheral pulses without edema.  Abdomen: Soft, + BS,  Non tender, no guarding, rebound, hernias, masses. 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 Warm, dry without rashes, lesions, ecchymosis.  Neuro: Awake and oriented X 3, Cranial nerves intact. Normal muscle tone, no cerebellar symptoms. Psych: Normal affect, Insight and Judgment appropriate.    Vicie Mutters, PA-C 4:58 PM [facility]

## 2017-04-16 ENCOUNTER — Ambulatory Visit (INDEPENDENT_AMBULATORY_CARE_PROVIDER_SITE_OTHER): Payer: 59 | Admitting: Physician Assistant

## 2017-04-16 ENCOUNTER — Encounter: Payer: Self-pay | Admitting: Physician Assistant

## 2017-04-16 ENCOUNTER — Ambulatory Visit: Payer: Self-pay | Admitting: Internal Medicine

## 2017-04-16 VITALS — BP 118/82 | HR 66 | Temp 97.5°F | Resp 16 | Ht 69.5 in | Wt 173.2 lb

## 2017-04-16 DIAGNOSIS — F329 Major depressive disorder, single episode, unspecified: Secondary | ICD-10-CM | POA: Diagnosis not present

## 2017-04-16 DIAGNOSIS — F32A Depression, unspecified: Secondary | ICD-10-CM

## 2017-04-16 DIAGNOSIS — M0600 Rheumatoid arthritis without rheumatoid factor, unspecified site: Secondary | ICD-10-CM

## 2017-04-16 DIAGNOSIS — E559 Vitamin D deficiency, unspecified: Secondary | ICD-10-CM | POA: Diagnosis not present

## 2017-04-16 DIAGNOSIS — R030 Elevated blood-pressure reading, without diagnosis of hypertension: Secondary | ICD-10-CM

## 2017-04-16 DIAGNOSIS — R7303 Prediabetes: Secondary | ICD-10-CM

## 2017-04-16 DIAGNOSIS — E782 Mixed hyperlipidemia: Secondary | ICD-10-CM | POA: Diagnosis not present

## 2017-04-16 DIAGNOSIS — Z79899 Other long term (current) drug therapy: Secondary | ICD-10-CM

## 2017-04-16 LAB — HEPATIC FUNCTION PANEL
ALT: 9 U/L (ref 9–46)
AST: 18 U/L (ref 10–35)
Albumin: 4.4 g/dL (ref 3.6–5.1)
Alkaline Phosphatase: 74 U/L (ref 40–115)
BILIRUBIN DIRECT: 0.1 mg/dL (ref <=0.2)
Indirect Bilirubin: 0.4 mg/dL (ref 0.2–1.2)
TOTAL PROTEIN: 6.8 g/dL (ref 6.1–8.1)
Total Bilirubin: 0.5 mg/dL (ref 0.2–1.2)

## 2017-04-16 LAB — BASIC METABOLIC PANEL WITH GFR
BUN: 14 mg/dL (ref 7–25)
CHLORIDE: 106 mmol/L (ref 98–110)
CO2: 24 mmol/L (ref 20–31)
Calcium: 9.2 mg/dL (ref 8.6–10.3)
Creat: 1.27 mg/dL (ref 0.70–1.33)
GFR, EST NON AFRICAN AMERICAN: 62 mL/min (ref >=60)
GFR, Est African American: 72 mL/min (ref >=60)
GLUCOSE: 85 mg/dL (ref 65–99)
POTASSIUM: 3.9 mmol/L (ref 3.5–5.3)
Sodium: 141 mmol/L (ref 135–146)

## 2017-04-16 LAB — CBC WITH DIFFERENTIAL/PLATELET
BASOS PCT: 1 %
Basophils Absolute: 59 cells/uL (ref 0–200)
EOS ABS: 118 {cells}/uL (ref 15–500)
Eosinophils Relative: 2 %
HEMATOCRIT: 38.5 % (ref 38.5–50.0)
HEMOGLOBIN: 12.9 g/dL — AB (ref 13.2–17.1)
LYMPHS ABS: 2183 {cells}/uL (ref 850–3900)
Lymphocytes Relative: 37 %
MCH: 31.5 pg (ref 27.0–33.0)
MCHC: 33.5 g/dL (ref 32.0–36.0)
MCV: 93.9 fL (ref 80.0–100.0)
MONO ABS: 708 {cells}/uL (ref 200–950)
MPV: 9.6 fL (ref 7.5–12.5)
Monocytes Relative: 12 %
Neutro Abs: 2832 cells/uL (ref 1500–7800)
Neutrophils Relative %: 48 %
Platelets: 259 10*3/uL (ref 140–400)
RBC: 4.1 MIL/uL — AB (ref 4.20–5.80)
RDW: 14.5 % (ref 11.0–15.0)
WBC: 5.9 10*3/uL (ref 3.8–10.8)

## 2017-04-16 LAB — LIPID PANEL
CHOL/HDL RATIO: 4.6 ratio (ref <5.0)
CHOLESTEROL: 196 mg/dL (ref <200)
HDL: 43 mg/dL (ref >40)
LDL Cholesterol: 141 mg/dL — ABNORMAL HIGH (ref <100)
Triglycerides: 58 mg/dL (ref <150)
VLDL: 12 mg/dL (ref <30)

## 2017-04-16 LAB — MAGNESIUM: MAGNESIUM: 2 mg/dL (ref 1.5–2.5)

## 2017-04-16 MED ORDER — FLUOROURACIL 0.5 % EX CREA: TOPICAL_CREAM | Freq: Every day | CUTANEOUS | 0 refills | Status: DC

## 2017-04-16 MED ORDER — ESCITALOPRAM OXALATE 20 MG PO TABS: 20.0000 mg | ORAL_TABLET | Freq: Every day | ORAL | 1 refills | Status: DC

## 2017-04-16 NOTE — Patient Instructions (Addendum)
Apply thin film to lesions once daily for up to 2 weeks, as tolerated Wear a hat  Fluorouracil, 5-FU skin cream or solution What is this medicine? FLUOROURACIL, 5-FU (flure oh YOOR a sil) is a chemotherapy agent. It is used on the skin to treat skin cancer and certain types of skin conditions that could become cancer. This medicine may be used for other purposes; ask your health care provider or pharmacist if you have questions. COMMON BRAND NAME(S): Carac, Efudex, Fluoroplex, Tolak What should I tell my health care provider before I take this medicine? They need to know if you have any of these conditions: -dihydropyrimidine dehydrogenase (DPD) deficiency -an unusual or allergic reaction to fluorouracil, other chemotherapy, other medicines, foods, dyes, or preservatives -pregnant or trying to get pregnant -breast-feeding How should I use this medicine? This medicine is only for use on the skin. Follow the directions on the prescription label. Wash hands before and after use. Wash affected area and gently pat dry. To apply this medicine use a cotton-tipped applicator, or use gloves if applying with fingertips. If applied with unprotected fingertips, it is very important to wash your hands well after you apply this medicine. Avoid applying to the eyes, nose, or mouth. Apply enough medicine to cover the affected area. You can cover the area with a light gauze dressing, but do not use tight or air-tight dressings. Finish the full course prescribed by your doctor or health care professional, even if you think your condition is better. Do not stop taking except on the advice of your doctor or health care professional. Talk to your pediatrician regarding the use of this medicine in children. Special care may be needed. Overdosage: If you think you have taken too much of this medicine contact a poison control center or emergency room at once. NOTE: This medicine is only for you. Do not share this medicine  with others. What if I miss a dose? If you miss a dose, apply it as soon as you can. If it is almost time for your next dose, only use that dose. Do not apply extra doses. Contact your doctor or health care professional if you miss more than one dose. What may interact with this medicine? Interactions are not expected. Do not use any other skin products without telling your doctor or health care professional. This list may not describe all possible interactions. Give your health care provider a list of all the medicines, herbs, non-prescription drugs, or dietary supplements you use. Also tell them if you smoke, drink alcohol, or use illegal drugs. Some items may interact with your medicine. What should I watch for while using this medicine? Visit your doctor or health care professional for checks on your progress. You will need to use this medicine for 2 to 6 weeks. This may be longer depending on the condition being treated. You may not see full healing for another 1 to 2 months after you stop using the medicine. Treated areas of skin can look unsightly during and for several weeks after treatment with this medicine. Do not get this medicine in your eyes. If you do, rinse out with plenty of cool tap water. This medicine can make you more sensitive to the sun. Keep out of the sun. If you cannot avoid being in the sun, wear protective clothing and use sunscreen. Do not use sun lamps or tanning beds/booths. If a pet comes in contact with the area where this medicine was applied to your skin or  if it is ingested, they may have a serious risk of side effects. If accidental contact happens, the skin of the pet should be washed right away with soap and water. Contact your vet right away if your pet becomes exposed. Do not become pregnant while taking this medicine or for 1 month after stopping it. Women should inform their doctor if they wish to become pregnant or think they might be pregnant. There is a  potential for serious side effects to an unborn child. Talk to your health care professional or pharmacist for more information. What side effects may I notice from receiving this medicine? Side effects that you should report to your doctor or health care professional as soon as possible: -allergic reactions like skin rash, itching or hives, swelling of the face, lips, or tongue -bloody diarrhea -fever or chills -stomach pain -vomiting Side effects that usually do not require medical attention (report to your doctor or health care professional if they continue or are bothersome): -redness or dry skin -sensitivity to light This list may not describe all possible side effects. Call your doctor for medical advice about side effects. You may report side effects to FDA at 1-800-FDA-1088. Where should I keep my medicine? Keep out of the reach of children and pets. See product for storage instructions. Each product may have different instructions. Throw away any unused medicine after the expiration date. NOTE: This sheet is a summary. It may not cover all possible information. If you have questions about this medicine, talk to your doctor, pharmacist, or health care provider.  2018 Elsevier/Gold Standard (2015-12-23 19:12:02)   Squamous Cell Carcinoma Squamous cell carcinoma is a common form of skin cancer. It begins in the squamous cells in the outer layer of the skin (epidermis). It occurs most often in parts of the body that are frequently exposed to the sun, such as the face, lips, neck, arms, legs, and hands. However, this condition can occur anywhere on the body, including the inside of the mouth, sites of long-term (chronic) scarring, and the anus. If squamous cell carcinoma is treated soon enough, it rarely spreads to other areas of the body (metastasizes). If it is not treated, it can destroy nearby tissues. In rare cases, it can spread to other areas. What are the causes? This condition is  usually caused by exposure to ultraviolet (UV) light. UV light may come from the sun or from tanning beds. Other causes include:  Exposure to arsenic.  Exposure to radiation.  Exposure to toxic tars and oils.  Certain genetic conditions, such as xeroderma pigmentosum. What increases the risk? This condition is more likely to develop in:  People who are older than 58 years of age.  People who have fair skin (light complexion).  People who have blonde or red hair.  People who have blue, green, or gray eyes.  People who have childhood freckling.  People who have had sun exposure over long periods of time, especially during childhood.  People who have had repeated sunburns.  People who have a weakened immune system.  People who have been exposed to certain chemicals, such as tar, soot, and arsenic.  People who have chronic inflammatory conditions.  People who have chronic infections.  People who have an HPV (human papillomavirus) infection.  People who have conditions that cause chronic scarring. These can include burn scars, chronic ulcers, heat (thermal) injuries, and radiation.  People who have had psoralen and ultraviolet A (PUVA) treatments.  People who smoke.  People  who use tanning beds. What are the signs or symptoms? This condition often starts as small pink or Carton growths on the skin. The growths have a rough surface that may feel like sandpaper. In some cases, the growths are easier to feel than to see. The growths may develop into a sore that does not heal. How is this diagnosed? This condition may be diagnosed with:  A physical exam.  Removal of a tissue sample to be examined under a microscope (biopsy). How is this treated? Treatment for this condition involves removing the cancerous tissue. The method that is used for this depends on the size and location of the tumors, as well as your overall health. Possible treatments include:  Mohs surgery. In  this procedure, the cancerous skin cells are removed layer by layer until all of the tumor has been removed.  Surgical removal (excision) of the tumor. This involves removing the entire tumor and a small amount of normal skin that surrounds it.  Cryosurgery. This involves freezing the tumor with liquid nitrogen.  Plastic surgery. The tumor is removed, and healthy skin from another part of the body is used to cover the wound. This may be done for large tumors that are in areas where it is not possible to stretch the nearby skin to sew the edges of the wound together.  Radiation. This may be used for tumors on the face.  Photodynamic therapy. A chemical cream is applied to the skin, and light exposure is used to activate the chemical.  Electrodesiccation and curettage. This involves alternately scraping and burning the tumor while using an electric current to control bleeding. Follow these instructions at home:  Avoid unprotected sun exposure.  Do self-exams as told by your health care provider. Look for new spots or changes in your skin.  Keep all follow-up visits as told by your health care provider. This is important.  Do not use tobacco products, including cigarettes, chewing tobacco, or e-cigarettes. If you need help quitting, ask your health care provider. How is this prevented?  Avoid the sun when it is the strongest. This is usually between 10:00 a.m. and 4:00 p.m.  When you are out in the sun, use a sunscreen that has a sun protection factor (SPF) of at least 43.  Apply sunscreen at least 30 minutes before exposure to the sun.  Reapply sunscreen every 2-4 hours while you are outside. Also reapply it after swimming and after excessive sweating.  Always wear hats, protective clothing, and UV-blocking sunglasses when you are outdoors.  Do not use tanning beds. Contact a health care provider if:  You notice any new growths or any changes in your skin.  You have had a  squamous cell carcinoma tumor removed and you notice a new growth in the same location. This information is not intended to replace advice given to you by your health care provider. Make sure you discuss any questions you have with your health care provider. Document Released: 05/19/2003 Document Revised: 07/10/2016 Document Reviewed: 03/07/2015 Elsevier Interactive Patient Education  2017 [employer].

## 2017-04-17 LAB — HEMOGLOBIN A1C
HEMOGLOBIN A1C: 5.4 % (ref <5.7)
MEAN PLASMA GLUCOSE: 108 mg/dL

## 2017-04-17 LAB — TSH: TSH: 1.92 mIU/L (ref 0.40–4.50)

## 2017-05-24 ENCOUNTER — Ambulatory Visit (INDEPENDENT_AMBULATORY_CARE_PROVIDER_SITE_OTHER): Payer: 59 | Admitting: Internal Medicine

## 2017-05-24 VITALS — BP 164/94 | HR 62 | Temp 97.3°F | Resp 16 | Ht 69.5 in | Wt 173.0 lb

## 2017-05-24 DIAGNOSIS — R42 Dizziness and giddiness: Secondary | ICD-10-CM | POA: Diagnosis not present

## 2017-05-24 DIAGNOSIS — R0989 Other specified symptoms and signs involving the circulatory and respiratory systems: Secondary | ICD-10-CM | POA: Diagnosis not present

## 2017-05-24 MED ORDER — PREDNISONE 20 MG PO TABS: ORAL_TABLET | ORAL | 0 refills | Status: DC

## 2017-05-24 MED ORDER — BISOPROLOL FUMARATE 10 MG PO TABS: ORAL_TABLET | ORAL | 1 refills | Status: DC

## 2017-05-24 MED ORDER — MECLIZINE HCL 25 MG PO TABS: ORAL_TABLET | ORAL | 0 refills | Status: AC

## 2017-05-26 ENCOUNTER — Encounter: Payer: Self-pay | Admitting: Internal Medicine

## 2017-05-26 NOTE — Patient Instructions (Signed)
Recommend Adult Low Dose Aspirin or  coated  Aspirin 81 mg daily  To reduce risk of Colon Cancer 20 %,  Skin Cancer 26 % ,  Melanoma 46%  and  Pancreatic cancer 60% +++++++++++++++++++++++++ Vitamin D goal  is between 70-100.  Please make sure that you are taking your Vitamin D as directed.  It is very important as a natural anti-inflammatory  helping hair, skin, and nails, as well as reducing stroke and heart attack risk.  It helps your bones and helps with mood. It also decreases numerous cancer risks so please take it as directed.  Low Vit D is associated with a 200-300% higher risk for CANCER  and 200-300% higher risk for HEART   ATTACK  &  STROKE.   ...................................... It is also associated with higher death rate at younger ages,  autoimmune diseases like Rheumatoid arthritis, Lupus, Multiple Sclerosis.    Also many other serious conditions, like depression, Alzheimer's Dementia, infertility, muscle aches, fatigue, fibromyalgia - just to name a few. ++++++++++++++++++++ Recommend the book "The END of DIETING" by Dr Joel Fuhrman  & the book "The END of DIABETES " by Dr Joel Fuhrman At [employer] - get book & Audio CD's    Being diabetic has a  300% increased risk for heart attack, stroke, cancer, and alzheimer- type vascular dementia. It is very important that you work harder with diet by avoiding all foods that are white. Avoid white rice (Barish & wild rice is OK), white potatoes (sweetpotatoes in moderation is OK), White bread or wheat bread or anything made out of white flour like bagels, donuts, rolls, buns, biscuits, cakes, pastries, cookies, pizza crust, and pasta (made from white flour & egg whites) - vegetarian pasta or spinach or wheat pasta is OK. Multigrain breads like Arnold's or Pepperidge Farm, or multigrain sandwich thins or flatbreads.  Diet, exercise and weight loss can reverse and cure diabetes in the early stages.  Diet, exercise and weight loss is  very important in the control and prevention of complications of diabetes which affects every system in your body, ie. Brain - dementia/stroke, eyes - glaucoma/blindness, heart - heart attack/heart failure, kidneys - dialysis, stomach - gastric paralysis, intestines - malabsorption, nerves - severe painful neuritis, circulation - gangrene & loss of a leg(s), and finally cancer and Alzheimers.    I recommend avoid fried & greasy foods,  sweets/candy, white rice (Simic or wild rice or Quinoa is OK), white potatoes (sweet potatoes are OK) - anything made from white flour - bagels, doughnuts, rolls, buns, biscuits,white and wheat breads, pizza crust and traditional pasta made of white flour & egg white(vegetarian pasta or spinach or wheat pasta is OK).  Multi-grain bread is OK - like multi-grain flat bread or sandwich thins. Avoid alcohol in excess. Exercise is also important.    Eat all the vegetables you want - avoid meat, especially red meat and dairy - especially cheese.  Cheese is the most concentrated form of trans-fats which is the worst thing to clog up our arteries. Veggie cheese is OK which can be found in the fresh produce section at [employer] or [employer] or [employer]  +++++++++++++++++++++ DASH Eating Plan  DASH stands for "Dietary Approaches to Stop Hypertension."   The DASH eating plan is a healthy eating plan that has been shown to reduce high blood pressure (hypertension). Additional health benefits may include reducing the risk of type 2 diabetes mellitus, heart disease, and stroke. The DASH eating plan may also   help with weight loss. WHAT DO I NEED TO KNOW ABOUT THE DASH EATING PLAN? For the DASH eating plan, you will follow these general guidelines:  Choose foods with a percent daily value for sodium of less than 5% (as listed on the food label).  Use salt-free seasonings or herbs instead of table salt or sea salt.  Check with your health care provider or pharmacist before  using salt substitutes.  Eat lower-sodium products, often labeled as "lower sodium" or "no salt added."  Eat fresh foods.  Eat more vegetables, fruits, and low-fat dairy products.  Choose whole grains. Look for the word "whole" as the first word in the ingredient list.  Choose fish   Limit sweets, desserts, sugars, and sugary drinks.  Choose heart-healthy fats.  Eat veggie cheese   Eat more home-cooked food and less restaurant, buffet, and fast food.  Limit fried foods.  Cook foods using methods other than frying.  Limit canned vegetables. If you do use them, rinse them well to decrease the sodium.  When eating at a restaurant, ask that your food be prepared with less salt, or no salt if possible.                      WHAT FOODS CAN I EAT? Read Dr Joel Fuhrman's books on The End of Dieting & The End of Diabetes  Grains Whole grain or whole wheat bread. Pennix rice. Whole grain or whole wheat pasta. Quinoa, bulgur, and whole grain cereals. Low-sodium cereals. Corn or whole wheat flour tortillas. Whole grain cornbread. Whole grain crackers. Low-sodium crackers.  Vegetables Fresh or frozen vegetables (raw, steamed, roasted, or grilled). Low-sodium or reduced-sodium tomato and vegetable juices. Low-sodium or reduced-sodium tomato sauce and paste. Low-sodium or reduced-sodium canned vegetables.   Fruits All fresh, canned (in natural juice), or frozen fruits.  Protein Products  All fish and seafood.  Dried beans, peas, or lentils. Unsalted nuts and seeds. Unsalted canned beans.  Dairy Low-fat dairy products, such as skim or 1% milk, 2% or reduced-fat cheeses, low-fat ricotta or cottage cheese, or plain low-fat yogurt. Low-sodium or reduced-sodium cheeses.  Fats and Oils Tub margarines without trans fats. Light or reduced-fat mayonnaise and salad dressings (reduced sodium). Avocado. Safflower, olive, or canola oils. Natural peanut or almond butter.  Other Unsalted popcorn  and pretzels. The items listed above may not be a complete list of recommended foods or beverages. Contact your dietitian for more options.  +++++++++++++++  WHAT FOODS ARE NOT RECOMMENDED? Grains/ White flour or wheat flour White bread. White pasta. White rice. Refined cornbread. Bagels and croissants. Crackers that contain trans fat.  Vegetables  Creamed or fried vegetables. Vegetables in a . Regular canned vegetables. Regular canned tomato sauce and paste. Regular tomato and vegetable juices.  Fruits Dried fruits. Canned fruit in light or heavy syrup. Fruit juice.  Meat and Other Protein Products Meat in general - RED meat & White meat.  Fatty cuts of meat. Ribs, chicken wings, all processed meats as bacon, sausage, bologna, salami, fatback, hot dogs, bratwurst and packaged luncheon meats.  Dairy Whole or 2% milk, cream, half-and-half, and cream cheese. Whole-fat or sweetened yogurt. Full-fat cheeses or blue cheese. Non-dairy creamers and whipped toppings. Processed cheese, cheese spreads, or cheese curds.  Condiments Onion and garlic salt, seasoned salt, table salt, and sea salt. Canned and packaged gravies. Worcestershire sauce. Tartar sauce. Barbecue sauce. Teriyaki sauce. Soy sauce, including reduced sodium. Steak sauce. Fish sauce. Oyster sauce. Cocktail   sauce. Horseradish. Ketchup and mustard. Meat flavorings and tenderizers. Bouillon cubes. Hot sauce. Tabasco sauce. Marinades. Taco seasonings. Relishes.  Fats and Oils Butter, stick margarine, lard, shortening and bacon fat. Coconut, palm kernel, or palm oils. Regular salad dressings.  Pickles and olives. Salted popcorn and pretzels.  The items listed above may not be a complete list of foods and beverages to avoid.   

## 2017-05-26 NOTE — Progress Notes (Signed)
Subjective:    Patient ID: Edwin Webster, male    DOB: 01/01/1961, 56 y.o.   MRN: [id number]  HPI  Patient presents for f/u after recent fall off of a ladder and was seen then at a [facility] ER and had a normal CBC, CXR, T-Spine XR's and a negative Head CT scan. Apparently, he was on the the ladder unbalance and it collapsed. He apparently had transient LOC.  In the ER , he was felt neurologically intact and subsequently released. Patient also describes some mild vertigo preceding the accident. BP is also noted elevated today. Denies exertional CP, palpitations, dyspnea or edema.   Medication Sig  . Cholecalciferol (VITAMIN D-3) 5000 UNITS TABS Take 5,000 Units by mouth daily.  Marland Kitchen escitalopram (LEXAPRO) 20 MG tablet Take 1 tablet (20 mg total) by mouth daily.  . fluoruracil (CARAC) 0.5 % cream Apply topically daily.  . fluticasone (FLONASE) 50 MCG/ACT nasal spray PLACE 2 SPRAYS INTO BOTH NOSTRILS DAILY.  Marland Kitchen levocetirizine (XYZAL) 5 MG tablet Take 1 tablet (5 mg total) by mouth every evening.  . Methotrexate, PF, 30 MG/0.6ML SOAJ Inject into the skin.  . Tofacitinib Citrate (XELJANZ PO) Take by mouth as directed.  Marland Kitchen atorvastatin (LIPITOR) 80 MG tablet Take 1/2 to 1 tablet as directed for cholesterol  . aspirin EC 81 MG tablet Take 81 mg by mouth daily.  . folic acid (FOLVITE) 1 MG tablet Take 1 mg by mouth.   Allergies  Allergen Reactions  . Humira [Adalimumab] Other (See Comments)    "blacked out", confused   Past Medical History:  Diagnosis Date  . Allergy   . Anemia   . Anxiety   . Arthritis   . Chest pain, non-cardiac   . COPD (chronic obstructive pulmonary disease) ([place])   . Depression   . Diverticulitis   . GERD (gastroesophageal reflux disease)   . Gout   . Hyperlipidemia   . Kidney stones   . Rheumatoid arthritis ([place])    hands   Past Surgical History:  Procedure Laterality Date  . COLONOSCOPY    . FINGER SURGERY    . KNEE SURGERY     right  . [place]     left   Review of Systems  10 point systems review negative except as above.    Objective:   Physical Exam  BP (!) 164/94   Pulse 62   Temp 97.3 F (36.3 C)   Resp 16   Ht 5' 9.5" (1.765 m)   Wt 173 lb (78.5 kg)   BMI 25.18 kg/m    Postural BP's with       BP 161/115     P   67 sitting     and    BP 159/112   P 70  HEENT - Eac's patent. TM's Nl. EOM's full. PERRLA. NasoOroPharynx clear. Neck - supple. Nl Thyroid. Carotids 2+ & No bruits, nodes, JVD Chest - Clear equal BS w/o Rales, rhonchi, wheezes. Cor - Nl HS. RRR w/o sig MGR. PP 1(+). No edema. Abd - No palpable organomegaly, masses or tenderness. BS nl. MS- FROM w/o deformities. Muscle power, tone and bulk Nl. Gait Nl. Neuro - No obvious Cr N abnormalities. Sensory, motor and Cerebellar functions appear Nl w/o focal abnormalities.    Assessment & Plan:   1. Vertigo  - predniSONE (DELTASONE) 20 MG tablet; 1 tab 3 x day for 3 days, then 1 tab 2 x day for 3 days, then  1 tab 1 x day for 5 days  Dispense: 20 tablet; Refill: 0 - meclizine (ANTIVERT) 25 MG tablet; Take 1/2 to 1 tablet 3 x/ day for Dizziness / Vertigo  Dispense: 30 tablet; Refill: 0  2. Labile hypertension  - bisoprolol (ZEBETA) 10 MG tablet; Take 1/2 to 1 tablet daily for BP  Dispense: 90 tablet; Refill: 1

## 2017-06-07 ENCOUNTER — Ambulatory Visit (INDEPENDENT_AMBULATORY_CARE_PROVIDER_SITE_OTHER): Payer: 59 | Admitting: Internal Medicine

## 2017-06-07 VITALS — BP 126/84 | HR 51 | Temp 97.2°F | Resp 16 | Ht 69.5 in | Wt 172.4 lb

## 2017-06-07 DIAGNOSIS — R0989 Other specified symptoms and signs involving the circulatory and respiratory systems: Secondary | ICD-10-CM | POA: Diagnosis not present

## 2017-06-08 ENCOUNTER — Encounter: Payer: Self-pay | Admitting: Internal Medicine

## 2017-06-08 NOTE — Progress Notes (Signed)
  Subjective:    Patient ID: Edwin Webster, male    DOB: 11-Apr-1961, 56 y.o.   MRN: [id number]  HPI   Patient returns for 2 week f/u after falling off of a ladder collapsing due to "mis-stepping". He had been evaluated at [facility] ER and had a negative CBC, CXR, Thoracic X-Rays and Head CT scan. He had antecedent mild vertigo which intermittently persists and also was started on Bisoprolol for elevated BP 164/94 and rechecked at 161/115. He has hx/o labile HTN since 2013.      Since on the Bisoprolol 10 mg x 1/2 tab he reports feeling less anxious.     Review of Systems     Objective:   Physical Exam  BP 126/84   Pulse (!) 51   Temp (!) 97.2 F (36.2 C)   Resp 16   Ht 5' 9.5" (1.765 m)   Wt 172 lb 6.4 oz (78.2 kg)   BMI 25.09 kg/m   HEENT - WNL. Neck - supple.  Chest - Clear equal BS. Cor - Nl HS. RRR w/o sig MGR. PP 1(+). No edema. MS- FROM w/o deformities.  Gait Nl. Neuro -  Nl w/o focal abnormalities.    Assessment & Plan:   1. Labile hypertension  - Discussed med effect & SE's.   - Advised continue 1/2 tab Zebeta 10 mg and monitor BP's and may increase to 1 whole tab if BP's elevate.   - recc 1 mo f/u OV

## 2017-06-18 ENCOUNTER — Ambulatory Visit
Admission: RE | Admit: 2017-06-18 | Discharge: 2017-06-18 | Disposition: A | Discharge status: Home / Self Care | Payer: 59 | Source: Ambulatory Visit | Attending: Orthopedic Surgery | Admitting: Orthopedic Surgery

## 2017-06-18 ENCOUNTER — Other Ambulatory Visit: Payer: Self-pay | Admitting: Orthopedic Surgery

## 2017-06-18 DIAGNOSIS — W11XXXA Fall on and from ladder, initial encounter: Secondary | ICD-10-CM

## 2017-07-11 ENCOUNTER — Ambulatory Visit (INDEPENDENT_AMBULATORY_CARE_PROVIDER_SITE_OTHER): Payer: 59 | Admitting: Internal Medicine

## 2017-07-11 VITALS — BP 134/82 | HR 64 | Temp 97.1°F | Resp 18 | Ht 69.5 in | Wt 171.4 lb

## 2017-07-11 DIAGNOSIS — R0989 Other specified symptoms and signs involving the circulatory and respiratory systems: Secondary | ICD-10-CM

## 2017-07-13 ENCOUNTER — Encounter: Payer: Self-pay | Admitting: Internal Medicine

## 2017-07-13 NOTE — Patient Instructions (Signed)

## 2017-07-13 NOTE — Progress Notes (Signed)
[facility]  Unk Pinto, M.D.      Uvaldo Bristle. Silverio Lay, P.A.-C [facility] [address] [place], [place] [address] Telephone [phone] Telefax [phone]  Subjective:    Patient ID: Edwin Webster, male    DOB: 1961-01-01, 56 y.o.   MRN: [id number]  HPI  This nice 56 yo MWM returns for f/u after starting Bisoprolol for labile HTN and he reports home BP's have been WNL. Initial BP here by nurse was 134/82 and by my recheck was 160/90.   Medication Sig  . bisoprolol  10 MG tablet Take 1/2 to 1 tablet daily for BP  . VITAMIN D 5000 UNITS  Take 5,000 Units by mouth daily.  Marland Kitchen LEXAPRO 20 MG tablet Take 1 tablet (20 mg total) by mouth daily.  . fluoruracil  0.5 % cream Apply topically daily.  Marland Kitchen FLONASE  nasal spray PLACE 2 SPRAYS INTO BOTH NOSTRILS DAILY.  Marland Kitchen XYZAL 5 MG tablet Take 1 tablet (5 mg total) by mouth every evening.  Marland Kitchen atorvastatin  80 MG tablet Take 1/2 to 1 tablet as directed for cholesterol  . Tofacitinib (XELJANZ) Take by mouth as directed.   Allergies  Allergen Reactions  . Humira [Adalimumab] Other (See Comments)    "blacked out", confused   Past Medical History:  Diagnosis Date  . Allergy   . Anemia   . Anxiety   . Arthritis   . Chest pain, non-cardiac   . COPD (chronic obstructive pulmonary disease) ([place])   . Depression   . Diverticulitis   . GERD (gastroesophageal reflux disease)   . Gout   . Hyperlipidemia   . Kidney stones   . Rheumatoid arthritis ([place])    hands   Review of Systems  10 point systems review negative except as above.    Objective:   Physical Exam  BP 134/82 -> reck'd at 160/90  P 64   T 97.1 F    R 18   Ht 5' 9.5" (   Wt 171 lb    BMI 24.95   HEENT - WNL. Neck - supple.  Chest - Clear equal BS. Cor - Nl HS. RRR w/o sig MGR. PP 1(+). No edema. MS- FROM w/o deformities.  Gait Nl. Neuro -  Nl w/o focal abnormalities.    Assessment & Plan:   1.  Labile hypertension  - encouraged to continue same and freq monitoring of BP's and call if > 145/95  -  Advised if BP's elevated to increase Bisoprolol  from 1/2 to 1 whole tab

## 2017-08-05 ENCOUNTER — Other Ambulatory Visit: Payer: Self-pay | Admitting: Physician Assistant

## 2017-08-22 ENCOUNTER — Encounter: Payer: Self-pay | Admitting: Internal Medicine

## 2017-08-22 ENCOUNTER — Ambulatory Visit (INDEPENDENT_AMBULATORY_CARE_PROVIDER_SITE_OTHER): Payer: 59 | Admitting: Internal Medicine

## 2017-08-22 VITALS — BP 126/76 | HR 56 | Temp 97.5°F | Resp 18 | Ht 69.5 in | Wt 175.4 lb

## 2017-08-22 DIAGNOSIS — Z79899 Other long term (current) drug therapy: Secondary | ICD-10-CM

## 2017-08-22 DIAGNOSIS — Z87828 Personal history of other (healed) physical injury and trauma: Secondary | ICD-10-CM | POA: Diagnosis not present

## 2017-08-22 DIAGNOSIS — R42 Dizziness and giddiness: Secondary | ICD-10-CM

## 2017-08-22 DIAGNOSIS — R0989 Other specified symptoms and signs involving the circulatory and respiratory systems: Secondary | ICD-10-CM | POA: Diagnosis not present

## 2017-08-22 DIAGNOSIS — R410 Disorientation, unspecified: Secondary | ICD-10-CM

## 2017-08-22 NOTE — Progress Notes (Signed)
[facility]   Unk Pinto, M.D.    Uvaldo Bristle. Silverio Lay, P.A.-C      Liane Comber, [place]                [address] [place], [place] [address] Telephone [phone] Telefax [phone] _________________________________  Subjective:    Patient ID: Edwin Webster, male    DOB: [birth year], 56 y.o.   MRN: [id number]  HPI  This very nice 56 yo MWM had a syncopal spell on 05/19/2017 and fell off of a ladder and was evaluated at [facility] ER and had a neg Head CT scan & XR's of T-spine, CXR  and Pelvis. He was released and seen here 6/29 and seemed neurologically intact & was dx'd with Vertigo. He was then seen again on 7/13  And 8/16 in f/u to assure stability and monitor BP & neuro status.     He presents today with c/o nonspecific global HA';s, blurred vision, difficulty focusing and occas diplopia and also describes Nystagmus which is not evident on exam today. He describes a sensation of spinning dizziness, falling or drifting to his Rt side and has stopped driving because of this. 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  He still reports that he cannot recall the original fall off of the ladder in June. He 's having difficulty with standing and balance in walking.   Medication Sig  . bisoprolol (ZEBETA) 10 MG tablet Take 1/2 to 1 tablet daily for BP  . Cholecalciferol (VITAMIN D-3) 5000 UNITS TABS Take 5,000 Units by mouth daily.  Marland Kitchen escitalopram (LEXAPRO) 20 MG tablet Take 1 tablet (20 mg total) by mouth daily.  . fluoruracil (CARAC) 0.5 % cream Apply topically daily.  . fluticasone (FLONASE) 50 MCG/ACT nasal spray PLACE 2 SPRAYS INTO BOTH NOSTRILS DAILY.  Marland Kitchen levocetirizine (XYZAL) 5 MG tablet TAKE 1 TABLET (5 MG TOTAL) BY MOUTH EVERY EVENING.  Marland Kitchen atorvastatin (LIPITOR) 80 MG tablet Take 1/2 to 1 tablet as directed for  cholesterol   Allergies  Allergen Reactions  . Humira [Adalimumab] Other (See Comments)    "blacked out", confused   Past Medical History:  Diagnosis Date  . Allergy   . Anemia   . Anxiety   . Arthritis   . Chest pain, non-cardiac   . COPD (chronic obstructive pulmonary disease) ([place])   . Depression   . Diverticulitis   . GERD (gastroesophageal reflux disease)   . Gout   . Hyperlipidemia   . Kidney stones   . Rheumatoid arthritis ([place])    hands   Past Surgical History:  Procedure Laterality Date  . COLONOSCOPY    . FINGER SURGERY    . KNEE SURGERY     right  . [place]   left   Review of Systems  10 point systems review negative except as above    Objective:   Physical Exam  BP 126/76   Pulse (!) 56   Temp (!) 97.5 F (36.4 C)   Resp 18   Ht 5' 9.5" (1.765 m)   Wt 175 lb 6.4 oz (79.6 kg)   BMI 25.53 kg/m   HEENT - Eac's patent. TM's Nl. EOM's full. PERRLA. NasoOroPharynx clear. Neck - supple. Nl Thyroid. Carotids 2+ & No bruits, nodes, JVD Chest -  Clear equal BS w/o Rales, rhonchi, wheezes. Cor - Nl HS. RRR w/o sig MGR. PP 1(+). No edema. Abd - No palpable organomegaly, masses or tenderness. BS nl. MS- FROM w/o deformities. Muscle power, tone and bulk Nl. Gait unsteady. Requires 1-2(+) support for balance.  Neuro - No obvious Cr N abnormalities. Flat affect. Cognition seems dulled or slowed.  Mildly (+) [place]. F->N more dystaxic Rt > Lt. Unable to balance on 1 foot.     Assessment & Plan:   1. Labile hypertension  - BASIC METABOLIC PANEL WITH GFR  2. History of head injury  - CT Head Wo Contrast; Future  3. Vertigo  - CT Head Wo Contrast; Future  4. Confusion  - CT Head Wo Contrast; Future  5. Medication management  - BASIC METABOLIC PANEL WITH GFR  ++++++++++++++++++++++++++++  Start with stat CT Head scan to r/o sub-acute Subdural Hematoma and if negative anticipate Brain MRI and likely Neurosurg or Neurology consult  pending results of scan(s).   Over 30 minutes of exam, counseling, chart review and high complex critical decision making was performed  Kirtland Bouchard, MD

## 2017-08-22 NOTE — Patient Instructions (Signed)
Head Injury, Adult There are many types of head injuries. They can be as minor as a bump. Some head injuries can be worse. Worse injuries include:  A strong hit to the head that hurts the brain (concussion).  A bruise of the brain (contusion). This means there is bleeding in the brain that can cause swelling.  A cracked skull (skull fracture).  Bleeding in the brain that gathers, gets thick (makes a clot), and forms a bump (hematoma).  Most problems from a head injury come in the first 24 hours. However, you may still have side effects up to 7-10 days after your injury. It is important to watch your condition for any changes. Follow these instructions at home: Activity  Rest as much as possible.  Avoid activities that are hard or tiring.  Make sure you get enough sleep.  Limit activities that need a lot of thought or attention, such as: ? Watching TV. ? Playing memory games and puzzles. ? Job-related work or homework. ? Working on the computer, social media, and texting.  Avoid activities that could cause another head injury until your doctor says it is okay. This includes playing sports.  Ask your doctor when it is safe for you to go back to your normal activities, such as work or school. Ask your doctor for a step-by-step plan for slowly going back to your normal activities.  Ask your doctor when you can drive, ride a bicycle, or use heavy machinery. Never do these activities if you are dizzy.  Lifestyle  Do not drink alcohol until your doctor says it is okay.  Avoid drug use.  If it is harder than usual to remember things, write them down.  If you are easily distracted, try to do one thing at a time.  Talk with family members or close friends when making important decisions.  Tell your friends, family, a trusted coworker, and work manager about your injury, symptoms, and limits (restrictions). Have them watch for any problems that are new or getting worse.  General  instructions  Take over-the-counter and prescription medicines only as told by your doctor.  Have someone stay with you for 24 hours after your head injury. This person should watch you for any changes in your symptoms and be ready to get help.  Keep all follow-up visits as told by your doctor. This is important.  Prevention  Work on your balance and strength. This can help you avoid falls.  Wear a seatbelt when you are in a moving vehicle.  Wear a helmet when: ? Riding a bicycle. ? Skiing. ? Doing any other sport or activity that has a risk of injury.  Drink alcohol only in moderation.  Make your home safer by: ? Getting rid of clutter from the floors and stairs, like things that can make you trip. ? Using grab bars in bathrooms and handrails by stairs. ? Placing non-slip mats on floors and in bathtubs. ? Putting more light in dim areas. Get help right away if:  You have: ? A very bad (severe) headache that is not helped by medicine. ? Trouble walking or weakness in your arms and legs. ? Clear or bloody fluid coming from your nose or ears. ? Changes in your seeing (vision). ? Jerky movements that you cannot control (seizure).  You throw up (vomit).  Your symptoms get worse.  You lose balance.  Your speech is slurred.  You pass out.  You are sleepier and have trouble staying awake.    The black centers of your eyes (pupils) change in size. These symptoms may be an emergency. Do not wait to see if the symptoms will go away. Get medical help right away. Call your local emergency services (911 in the [place] Do not drive yourself to the hospital. This information is not intended to replace advice given to you by your health care provider. Make sure you discuss any questions you have with your health care provider. Document Released: 10/25/2008 Document Revised: 06/07/2016 Document Reviewed: 05/22/2016 Elsevier Interactive Patient Education  2017 [employer]  

## 2017-08-23 ENCOUNTER — Other Ambulatory Visit: Payer: Self-pay | Admitting: Internal Medicine

## 2017-08-23 ENCOUNTER — Ambulatory Visit
Admission: RE | Admit: 2017-08-23 | Discharge: 2017-08-23 | Disposition: A | Discharge status: Home / Self Care | Payer: 59 | Source: Ambulatory Visit | Attending: Internal Medicine | Admitting: Internal Medicine

## 2017-08-23 DIAGNOSIS — G3281 Cerebellar ataxia in diseases classified elsewhere: Secondary | ICD-10-CM

## 2017-08-23 DIAGNOSIS — R930 Abnormal findings on diagnostic imaging of skull and head, not elsewhere classified: Secondary | ICD-10-CM

## 2017-08-23 DIAGNOSIS — Z87828 Personal history of other (healed) physical injury and trauma: Secondary | ICD-10-CM

## 2017-08-23 DIAGNOSIS — R410 Disorientation, unspecified: Secondary | ICD-10-CM

## 2017-08-23 DIAGNOSIS — R29818 Other symptoms and signs involving the nervous system: Secondary | ICD-10-CM

## 2017-08-23 DIAGNOSIS — R42 Dizziness and giddiness: Secondary | ICD-10-CM

## 2017-08-23 LAB — BASIC METABOLIC PANEL WITH GFR
BUN: 13 mg/dL (ref 7–25)
CO2: 25 mmol/L (ref 20–32)
CREATININE: 1.12 mg/dL (ref 0.70–1.33)
Calcium: 9.2 mg/dL (ref 8.6–10.3)
Chloride: 106 mmol/L (ref 98–110)
GFR, EST NON AFRICAN AMERICAN: 72 mL/min/{1.73_m2} (ref >=60)
GFR, Est African American: 83 mL/min/{1.73_m2} (ref >=60)
Glucose, Bld: 85 mg/dL (ref 65–99)
Potassium: 4.4 mmol/L (ref 3.5–5.3)
SODIUM: 141 mmol/L (ref 135–146)

## 2017-08-26 ENCOUNTER — Telehealth: Payer: Self-pay | Admitting: *Deleted

## 2017-08-26 NOTE — Telephone Encounter (Signed)
Patient advised of CT results and an MRI of the head being scheduled.

## 2017-08-27 ENCOUNTER — Encounter: Payer: Self-pay | Admitting: Internal Medicine

## 2017-08-28 ENCOUNTER — Other Ambulatory Visit: Payer: Self-pay | Admitting: Internal Medicine

## 2017-08-28 ENCOUNTER — Ambulatory Visit (HOSPITAL_COMMUNITY)
Admission: RE | Admit: 2017-08-28 | Discharge: 2017-08-28 | Disposition: A | Discharge status: Home / Self Care | Payer: 59 | Source: Ambulatory Visit | Attending: Internal Medicine | Admitting: Internal Medicine

## 2017-08-28 DIAGNOSIS — S069X1S Unspecified intracranial injury with loss of consciousness of 30 minutes or less, sequela: Secondary | ICD-10-CM

## 2017-08-28 DIAGNOSIS — G3281 Cerebellar ataxia in diseases classified elsewhere: Secondary | ICD-10-CM | POA: Diagnosis not present

## 2017-08-28 DIAGNOSIS — R4189 Other symptoms and signs involving cognitive functions and awareness: Secondary | ICD-10-CM

## 2017-08-28 DIAGNOSIS — G9389 Other specified disorders of brain: Secondary | ICD-10-CM | POA: Insufficient documentation

## 2017-08-28 DIAGNOSIS — R9089 Other abnormal findings on diagnostic imaging of central nervous system: Secondary | ICD-10-CM

## 2017-08-28 DIAGNOSIS — R930 Abnormal findings on diagnostic imaging of skull and head, not elsewhere classified: Secondary | ICD-10-CM | POA: Diagnosis not present

## 2017-08-28 DIAGNOSIS — R29818 Other symptoms and signs involving the nervous system: Secondary | ICD-10-CM

## 2017-08-28 DIAGNOSIS — R2681 Unsteadiness on feet: Secondary | ICD-10-CM

## 2017-08-28 MED ORDER — GADOBENATE DIMEGLUMINE 529 MG/ML IV SOLN
18.0000 mL | Freq: Once | INTRAVENOUS | Status: AC | PRN
  Administered 2017-08-28: 18 mL via INTRAVENOUS

## 2017-09-11 ENCOUNTER — Telehealth: Payer: Self-pay | Admitting: *Deleted

## 2017-09-11 NOTE — Telephone Encounter (Signed)
Patient called and reported an episode on 09/10/2017 of an area about the size of a cantaloupe, that was blurred in his left eye.  The problem lasted about 2 hours. Per Dr Melford Aase, the patient needs to called his eye doctor, who is Dr Gershon Crane.  The patient is aware and will call the doctor.

## 2017-09-13 ENCOUNTER — Ambulatory Visit (INDEPENDENT_AMBULATORY_CARE_PROVIDER_SITE_OTHER): Payer: 59

## 2017-09-13 DIAGNOSIS — Z23 Encounter for immunization: Secondary | ICD-10-CM | POA: Diagnosis not present

## 2017-09-16 ENCOUNTER — Other Ambulatory Visit: Payer: Self-pay | Admitting: Internal Medicine

## 2017-09-16 DIAGNOSIS — G453 Amaurosis fugax: Secondary | ICD-10-CM

## 2017-09-20 ENCOUNTER — Ambulatory Visit (HOSPITAL_COMMUNITY)
Admission: RE | Admit: 2017-09-20 | Discharge: 2017-09-20 | Disposition: A | Discharge status: Home / Self Care | Payer: 59 | Source: Ambulatory Visit | Attending: Cardiology | Admitting: Cardiology

## 2017-09-20 DIAGNOSIS — R7303 Prediabetes: Secondary | ICD-10-CM | POA: Diagnosis not present

## 2017-09-20 DIAGNOSIS — Z87891 Personal history of nicotine dependence: Secondary | ICD-10-CM | POA: Insufficient documentation

## 2017-09-20 DIAGNOSIS — E785 Hyperlipidemia, unspecified: Secondary | ICD-10-CM | POA: Diagnosis not present

## 2017-09-20 DIAGNOSIS — I10 Essential (primary) hypertension: Secondary | ICD-10-CM | POA: Insufficient documentation

## 2017-09-20 DIAGNOSIS — G453 Amaurosis fugax: Secondary | ICD-10-CM | POA: Insufficient documentation

## 2017-09-20 DIAGNOSIS — I6523 Occlusion and stenosis of bilateral carotid arteries: Secondary | ICD-10-CM | POA: Insufficient documentation

## 2017-10-15 ENCOUNTER — Encounter: Payer: Self-pay | Admitting: Neurology

## 2017-10-15 ENCOUNTER — Encounter: Payer: Self-pay | Admitting: Psychology

## 2017-10-15 ENCOUNTER — Ambulatory Visit (INDEPENDENT_AMBULATORY_CARE_PROVIDER_SITE_OTHER): Payer: PRIVATE HEALTH INSURANCE | Admitting: Neurology

## 2017-10-15 VITALS — BP 148/82 | HR 51 | Ht 72.0 in | Wt 179.0 lb

## 2017-10-15 DIAGNOSIS — R42 Dizziness and giddiness: Secondary | ICD-10-CM

## 2017-10-15 DIAGNOSIS — R519 Headache, unspecified: Secondary | ICD-10-CM

## 2017-10-15 DIAGNOSIS — R351 Nocturia: Secondary | ICD-10-CM

## 2017-10-15 DIAGNOSIS — R51 Headache: Secondary | ICD-10-CM

## 2017-10-15 DIAGNOSIS — R4789 Other speech disturbances: Secondary | ICD-10-CM | POA: Diagnosis not present

## 2017-10-15 DIAGNOSIS — R0683 Snoring: Secondary | ICD-10-CM | POA: Diagnosis not present

## 2017-10-15 DIAGNOSIS — R413 Other amnesia: Secondary | ICD-10-CM

## 2017-10-15 DIAGNOSIS — R419 Unspecified symptoms and signs involving cognitive functions and awareness: Secondary | ICD-10-CM | POA: Diagnosis not present

## 2017-10-15 NOTE — Progress Notes (Signed)
Subjective:    Patient ID: Edwin Webster is a 56 y.o. male.  HPI     Edwin Age, MD, PhD [facility] [address], [address] [place], [place] [address]  Dear Dr. Melford Webster,   I saw your patient, Edwin Webster, upon your kind request in my neurologic clinic today for initial consultation of his cognitive complaints. The patient is accompanied by his wife today. As you know, Edwin Webster is a 56 year old right-handed gentleman with an underlying medical history of hypertension, hyperlipidemia, vitamin D deficiency, anxiety, allergies, rheumatoid arthritis, kidney stones, gout, diverticulitis, depression, anemia, and COPD (chart review) who reports difficulty with remembering things and recurrent vertiginous symptoms. He reports his symptoms started after a fall off a ladder about 5 months ago. I reviewed your office note from 08/23/2017. He had a recent head CT without contrast on 08/23/2017, and I reviewed the results: IMPRESSION: 1. Small hyperdense focus in the left parietal lobe compatible with small focus of acute hemorrhage versus hyperdense lesion. Findings are not be compatible with hemorrhage from 3 months ago. 2. Correlation history of recent trauma is recommended. Consider further evaluation with contrast enhanced brain MRI if there is no recent history of trauma. 3. Sinus disease. He had a recent brain MRI with and without contrast on 08/28/2017 which I reviewed: IMPRESSION: 1. Previously identified 7 mm hyperdense lesion at the posterior left frontal lobe is consistent with a small benign cavernoma. No evidence for recent hemorrhage or other complication. 2. Few additional scattered chronic micro hemorrhages within the right cerebral hemisphere, nonspecific, but most commonly related to chronic underlying hypertension. 3. Ventricular prominence somewhat out of proportion relative to cortical sulcation. While this finding may be incidental and of  no clinical significance, this can also be seen in the setting of underlying normal pressure hydrocephalus. 4. Diffusely heterogeneous and mottled appearance of the visualized bone marrow. Findings are nonspecific, but can be seen with osteoporosis. More concerning etiologies including multiple myeloma, diffuse osseous metastases, and leukemia/lymphoma or other marrow infiltrative process could also have this appearance. Correlation with laboratory values recommended. Additionally, further imaging including bone scan or PET-CT could be considered as clinically warranted. I personally reviewed the images through the PACS system.  He had a recent episode of visual obscurations or visual field defect with blurry vision and was checked out by his ophthalmologist. He saw Dr. Gershon Webster on 09/16/2017 with no obvious etiology found.  He quit smoking some 10 years ago. He quit drinking alcohol 15 years ago. He lives with his wife. They have 3 children. He has been using meclizine as needed. He reports feeling dizzy at times. He loses his train of thought and also has short term memory loss. It sounds like he has lapses in his memory. His wife reports that he tends to repeat himself. He has not had any trouble at work with the supervisor. He has had word finding difficulty. When walking, he feels he veers to the right. He reports that the meclizine has helped. He also has had vertigo spells, last time about 2 weeks ago. These spells last for a couple of hours, not days thankfully. He has stumbled but he hasn't fallen. He has an intermittent issue with visual field cuts.   He had a CTH at [facility], which showed: IMPRESSION: No acute intracranial abnormality. He saw an orthopedic surgeon for a pelvic Fx, which was managed conservatively.  He had a recent carotid Doppler ultrasound on 09/20/2017 ordered by Dr.  Gershon Webster, which showed 1-39% bilateral ICA stenosis. Of note, he snores and has done so for  years. He also has breathing pauses while he is asleep. He does not sleep well. He has nocturia about twice per average night. He has had some recurrent headaches for which she has not taken any over-the-counter medication. He has woken up with a headache as well. He has not had urinary incontinence.   His Past Medical History Is Significant For: Past Medical History:  Diagnosis Date  . Allergy   . Anemia   . Anxiety   . Arthritis   . Chest pain, non-cardiac   . COPD (chronic obstructive pulmonary disease) ([place])   . Depression   . Diverticulitis   . GERD (gastroesophageal reflux disease)   . Gout   . Hyperlipidemia   . Kidney stones   . Rheumatoid arthritis ([place])    hands    His Past Surgical History Is Significant For: Past Surgical History:  Procedure Laterality Date  . COLONOSCOPY    . FINGER SURGERY    . KNEE SURGERY     right  . ROTATOR CUFF REPAIR  1996   left    His Family History Is Significant For: Family History  Problem Relation Webster of Onset  . Hyperlipidemia Mother   . COPD Mother   . Heart failure Mother   . Cancer Mother        breast  . Hyperlipidemia Brother   . Stroke Maternal Grandfather   . COPD Maternal Grandfather        colon  . Colon cancer Maternal Grandfather 55       died at 65  . Esophageal cancer Neg Hx   . Stomach cancer Neg Hx   . Rectal cancer Neg Hx   . Hypertension Father     His Social History Is Significant For: Social History   Socioeconomic History  . Marital status: Married    Spouse name: None  . Number of children: None  . Years of education: None  . Highest education level: None  Social Needs  . Financial resource strain: None  . Food insecurity - worry: None  . Food insecurity - inability: None  . Transportation needs - medical: None  . Transportation needs - non-medical: None  Occupational History  . None  Tobacco Use  . Smoking status: Former Smoker    Packs/day: 2.00    Types: Cigarettes    Last  attempt to quit: 07/03/2010    Years since quitting: 7.2  . Smokeless tobacco: Never Used  Substance and Sexual Activity  . Alcohol use: No    Alcohol/week: 0.0 oz  . Drug use: No  . Sexual activity: None  Other Topics Concern  . None  Social History Narrative  . None    His Allergies Are:  Allergies  Allergen Reactions  . Humira [Adalimumab] Other (See Comments)    "blacked out", confused  :   His Current Medications Are:  Outpatient Encounter Medications as of 10/15/2017  Medication Sig  . aspirin EC 81 MG tablet Take 81 mg daily by mouth.  . bisoprolol (ZEBETA) 10 MG tablet Take 1/2 to 1 tablet daily for BP  . Cholecalciferol (VITAMIN D-3) 5000 UNITS TABS Take 5,000 Units by mouth daily.  Marland Kitchen escitalopram (LEXAPRO) 20 MG tablet Take 1 tablet (20 mg total) by mouth daily.  . fluticasone (FLONASE) 50 MCG/ACT nasal spray PLACE 2 SPRAYS INTO BOTH NOSTRILS DAILY. (Patient taking differently: PLACE 2 SPRAYS  INTO BOTH NOSTRILS DAILY as needed.)  . folic acid (FOLVITE) 1 MG tablet Take 1 mg daily by mouth.  . Ibuprofen-Famotidine (DUEXIS) 800-26.6 MG TABS Take daily as needed by mouth.  . levocetirizine (XYZAL) 5 MG tablet TAKE 1 TABLET (5 MG TOTAL) BY MOUTH EVERY EVENING.  Marland Kitchen atorvastatin (LIPITOR) 80 MG tablet Take 1/2 to 1 tablet as directed for cholesterol  . [DISCONTINUED] fluoruracil (CARAC) 0.5 % cream Apply topically daily.   No facility-administered encounter medications on file as of 10/15/2017.   :   Review of Systems:  Out of a complete 14 point review of systems, all are reviewed and negative with the exception of these symptoms as listed below:  Review of Systems  Neurological:       Pt presents today to discuss his head injury. Pt is having memory loss and confusion. Pt is also having vertigo.     Objective:  Neurological Exam  Physical Exam Physical Examination:   Vitals:   10/15/17 0940  BP: (!) 148/82  Pulse: (!) 51    General Examination: The  patient is a very pleasant 56 y.o. male in no acute distress. He appears well-developed and well-nourished and well groomed.   HEENT: Normocephalic, atraumatic, pupils are equal, round and reactive to light and accommodation. Funduscopic exam is normal with sharp disc margins noted. Extraocular tracking is good without limitation to gaze excursion or nystagmus noted. Normal smooth pursuit is noted. Hearing is grossly intact. He has no vertiginous symptoms upon changing positions. Face is symmetric with normal facial animation and normal facial sensation. Speech is clear with no dysarthria noted. There is no hypophonia. There is no lip, neck/head, jaw or voice tremor. Neck is supple with full range of passive and active motion. There are no carotid bruits on auscultation. Oropharynx exam reveals: mild mouth dryness, adequate dental hygiene and moderate airway crowding, due to smaller airway entry and redundant soft palate, tonsils are small. Mallampati is class II. Neck circumference is 16-1/2 inches. Tongue protrudes centrally and palate elevates symmetrically.   Chest: Clear to auscultation without wheezing, rhonchi or crackles noted.  Heart: S1+S2+0, regular and normal without murmurs, rubs or gallops noted.   Abdomen: Soft, non-tender and non-distended with normal bowel sounds appreciated on auscultation.  Extremities: There is trace pitting edema in the distal lower extremities bilaterally. Pedal pulses are intact.  Skin: Warm and dry without trophic changes noted.  Musculoskeletal: exam reveals no obvious joint deformities, tenderness or joint swelling or erythema.   Neurologically:  Mental status: The patient is awake, alert and oriented in all 4 spheres. His immediate and remote memory, attention, language skills and fund of knowledge are appropriate. There is no evidence of aphasia, agnosia, apraxia or anomia. Speech is clear with normal prosody and enunciation. Thought process is linear.  Mood is normal and affect is normal.   On 10/15/2017: MMSE 29/30, CDT: 4/4, AFT: 14/min.   Cranial nerves II - XII are as described above under HEENT exam. In addition: shoulder shrug is normal with equal shoulder height noted. Motor exam: Normal bulk, strength and tone is noted. There is no drift, tremor or rebound. Romberg is negative, except for initial sway. Reflexes are 2+ throughout. Fine motor skills and coordination: intact with normal finger taps, normal hand movements, normal rapid alternating patting, normal foot taps and normal foot agility.  Cerebellar testing: No dysmetria or intention tremor on finger to nose testing, mildly slow and deliberate movements. Heel to shin is unremarkable bilaterally. There  is no truncal or gait ataxia.  Sensory exam: intact to light touch, vibration, temp in the upper and lower extremities.  Gait, station and balance: He stands easily. No veering to one side is noted. No leaning to one side is noted. Posture is Webster-appropriate and stance is narrow based. Gait shows normal stride length and normal pace. No problems turning are noted. No shuffling, no magnetic gait. Tandem walk is  slightly slow and cautious but doable.   Assessment and Plan:  In summary, ZAYAAN KOZAK is a very pleasant 56 y.o.-year old male with an underlying medical history of hypertension, hyperlipidemia, vitamin D deficiency, anxiety, allergies, rheumatoid arthritis, kidney stones, gout, diverticulitis, depression, anemia, and COPD (chart review) who presents for neurologic consultation of his cognitive complaints and intermittent vertiginous symptoms. Currently, on exam he does not have any vertigo symptoms or exam in keeping with vertigo. He has a slow and cautious gait, no focality on exam thankfully. Memory scores are good today. We talked about his brain MRI findings. While I did note ventriculomegaly or prominence of his ventricles on the recent brain MRI, out of proportion to atrophy  his history and exam do not suggest clinical picture of normal pressure hydrocephalus. We can certainly monitor him clinically. I did not suggest any large volume spinal tap. Ventriculomegaly is of course not a result of his recent fall off the ladder. For his incidental finding of the small cavernoma he can certainly get input from neurosurgery and he is encouraged to talk with you about a potential referral. He has not had any recent bout of vertigo except for a few hours a couple weeks ago. Meclizine has helped. He can continue utilize this as needed. We can certainly think about a referral to physical therapy for vestibular rehabilitation down the road. His memory issues are nonspecific. I suggested further and more in-depth evaluation with referral to neuropsychology. He is agreeable to this. From my end of things we will also do an EEG. In addition I suggested sleep study testing for concern for underlying obstructive sleep apnea which could cause sleep interruption, nocturia, morning headaches. He does endorse daytime tiredness as well. We will keep them posted as to his test results as we get results back. I will see him back routinely in about 3 months, sooner as needed. I answered all their questions today and the patient and his wife were in agreement.  Thank you very much for allowing me to participate in the care of this nice patient. If I can be of any further assistance to you please do not hesitate to call me at [phone].  Sincerely,   Edwin Age, MD, PhD

## 2017-10-15 NOTE — Patient Instructions (Addendum)
You have complaints of memory loss: memory loss or changes in cognitive function can have many reasons and does not always mean you have dementia. Conditions that can contribute to subjective or objective memory loss include: depression, stress, poor sleep from insomnia or sleep apnea, dehydration, fluctuation in blood sugar values, thyroid or electrolyte dysfunction and certain vitamin deficiencies. Dementia can be caused by stroke, brain atherosclerosis or brain vascular disease due to vascular risk factors (smoking, high blood pressure, high cholesterol, obesity and uncontrolled diabetes), certain degenerative brain disorders (including Parkinson's disease and Multiple sclerosis) and by Alzheimer's disease or other, more rare and sometimes hereditary causes.   You have had a recent brain scan, which showed small areas of hemorrhage/bleeding, which could be due to high blood pressure. 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 Please talk to Dr. Melford Aase about seeing a neurosurgeon for consultation.   Your memory scores are good today. Nevertheless, I would recommend additional testing:   We will do an EEG (brainwave test), which we will schedule. We will call you with the results.  We will do sleep testing for concern for underlying sleep apnea. We will keep you posted as to your results.   I will also request a formal cognitive test called neuropsychological evaluation which is done by a licensed neuropsychologist. We will make a referral in that regard.

## 2017-10-16 ENCOUNTER — Encounter: Payer: Self-pay | Admitting: Internal Medicine

## 2017-10-24 ENCOUNTER — Ambulatory Visit (INDEPENDENT_AMBULATORY_CARE_PROVIDER_SITE_OTHER): Payer: PRIVATE HEALTH INSURANCE | Admitting: Neurology

## 2017-10-24 ENCOUNTER — Ambulatory Visit: Payer: 59 | Admitting: Physician Assistant

## 2017-10-24 VITALS — BP 124/82 | HR 52 | Temp 97.4°F | Resp 16 | Ht 72.0 in | Wt 177.8 lb

## 2017-10-24 DIAGNOSIS — R42 Dizziness and giddiness: Secondary | ICD-10-CM | POA: Diagnosis not present

## 2017-10-24 DIAGNOSIS — E782 Mixed hyperlipidemia: Secondary | ICD-10-CM

## 2017-10-24 DIAGNOSIS — F3341 Major depressive disorder, recurrent, in partial remission: Secondary | ICD-10-CM

## 2017-10-24 DIAGNOSIS — R51 Headache: Secondary | ICD-10-CM

## 2017-10-24 DIAGNOSIS — R351 Nocturia: Secondary | ICD-10-CM

## 2017-10-24 DIAGNOSIS — E559 Vitamin D deficiency, unspecified: Secondary | ICD-10-CM

## 2017-10-24 DIAGNOSIS — K439 Ventral hernia without obstruction or gangrene: Secondary | ICD-10-CM | POA: Diagnosis not present

## 2017-10-24 DIAGNOSIS — R4789 Other speech disturbances: Secondary | ICD-10-CM

## 2017-10-24 DIAGNOSIS — R0989 Other specified symptoms and signs involving the circulatory and respiratory systems: Secondary | ICD-10-CM | POA: Diagnosis not present

## 2017-10-24 DIAGNOSIS — R410 Disorientation, unspecified: Secondary | ICD-10-CM

## 2017-10-24 DIAGNOSIS — R0683 Snoring: Secondary | ICD-10-CM

## 2017-10-24 DIAGNOSIS — R519 Headache, unspecified: Secondary | ICD-10-CM

## 2017-10-24 DIAGNOSIS — R413 Other amnesia: Secondary | ICD-10-CM

## 2017-10-24 DIAGNOSIS — R419 Unspecified symptoms and signs involving cognitive functions and awareness: Secondary | ICD-10-CM

## 2017-10-24 DIAGNOSIS — Z79899 Other long term (current) drug therapy: Secondary | ICD-10-CM

## 2017-10-24 NOTE — Progress Notes (Signed)
Please call and advise the patient that the EEG or brain wave test we performed was reported as normal in the awake state. We checked for abnormal electrical discharges in the brain waves and the report suggested normal findings. No further action is required on this test at this time. Please remind patient to keep any upcoming appointments or tests and to call us with any interim questions, concerns, problems or updates. Thanks,  Mikale Silversmith, MD, PhD  

## 2017-10-24 NOTE — Progress Notes (Signed)
Assessment and Plan:   Labile hypertension - continue medications, DASH diet, exercise and monitor at home. Call if greater than 130/80.  -     CBC with Differential/Platelet -     BASIC METABOLIC PANEL WITH GFR -     Hepatic function panel -     TSH  Hyperlipidemia -continue medications, check lipids, decrease fatty foods, increase activity.  -     Lipid panel  Medication management -     Magnesium  Vitamin D deficiency Continue supplement  Vertigo Continue neuro follow up, get sleep study  Depression, major, recurrent, in partial remission ([place]) get sleep study, continue lexapro  Ventral hernia without obstruction or gangrene monitor  Continue diet and meds as discussed. Further disposition pending results of labs. Future Appointments  Date Time Provider [place]  01/15/2018 10:30 AM Star Age, MD [facility] None  03/04/2018  9:00 AM Kandis Nab, PsyD [facility] None  03/12/2018 10:30 AM [facility] NEUROPSYCH TECH [facility] None  03/24/2018 10:00 AM Kandis Nab, PsyD [facility] None    HPI 56 y.o. male  presents for 3 month follow up with hypertension, hyperlipidemia, prediabetes and vitamin D.   His blood pressure has been controlled at home, today their BP is BP: 124/82.   He does workout. He denies chest pain, shortness of breath, dizziness. Had cognitive issues and vertigo, had MRI, following Dr. Rexene Alberts, got EEG today and getting set up for sleep study. He is not driving at this time, his wife is driving him.   He is on cholesterol medication, he is on cholesterol and denies myalgias. His cholesterol is not at goal. The cholesterol last visit was:   Lab Results  Component Value Date   CHOL 196 04/16/2017   HDL 43 04/16/2017   LDLCALC 141 (H) 04/16/2017   TRIG 58 04/16/2017   CHOLHDL 4.6 04/16/2017    He has been working on diet and exercise for prediabetes, and denies foot ulcerations, hyperglycemia, hypoglycemia , increased appetite, nausea,  paresthesia of the feet, polydipsia, polyuria, visual disturbances, vomiting and weight loss. Last A1C in the office was:  Lab Results  Component Value Date   HGBA1C 5.4 04/16/2017   Patient is on Vitamin D supplement, 10,000 IU.  Lab Results  Component Value Date   VD25OH 52 10/16/2016     BMI is Body mass index is 24.11 kg/m., he is working on diet and exercise. Wt Readings from Last 3 Encounters:  10/24/17 177 lb 12.8 oz (80.6 kg)  10/15/17 179 lb (81.2 kg)  08/22/17 175 lb 6.4 oz (79.6 kg)     Current Medications:  Current Outpatient Medications on File Prior to Visit  Medication Sig Dispense Refill  . aspirin EC 81 MG tablet Take 81 mg daily by mouth.    . bisoprolol (ZEBETA) 10 MG tablet Take 1/2 to 1 tablet daily for BP 90 tablet 1  . Cholecalciferol (VITAMIN D-3) 5000 UNITS TABS Take 5,000 Units by mouth daily.    Marland Kitchen escitalopram (LEXAPRO) 20 MG tablet Take 1 tablet (20 mg total) by mouth daily. 90 tablet 1  . fluticasone (FLONASE) 50 MCG/ACT nasal spray PLACE 2 SPRAYS INTO BOTH NOSTRILS DAILY. (Patient taking differently: PLACE 2 SPRAYS INTO BOTH NOSTRILS DAILY as needed.) 16 g 2  . folic acid (FOLVITE) 1 MG tablet Take 1 mg daily by mouth.    . Ibuprofen-Famotidine (DUEXIS) 800-26.6 MG TABS Take daily as needed by mouth.    . levocetirizine (XYZAL) 5 MG tablet TAKE 1 TABLET (  5 MG TOTAL) BY MOUTH EVERY EVENING. 90 tablet 3  . atorvastatin (LIPITOR) 80 MG tablet Take 1/2 to 1 tablet as directed for cholesterol 90 tablet 3   No current facility-administered medications on file prior to visit.     Medical History:  Past Medical History:  Diagnosis Date  . Allergy   . Anemia   . Anxiety   . Arthritis   . Chest pain, non-cardiac   . COPD (chronic obstructive pulmonary disease) ([place])   . Depression   . Diverticulitis   . GERD (gastroesophageal reflux disease)   . Gout   . Hyperlipidemia   . Kidney stones   . Rheumatoid arthritis ([facility])    hands    Allergies:   Allergies  Allergen Reactions  . Humira [Adalimumab] Other (See Comments)    "blacked out", confused     Review of Systems:  Review of Systems  Constitutional: Positive for malaise/fatigue. Negative for chills and fever.  HENT: Negative for congestion, ear discharge and sore throat.   Eyes: Negative.   Respiratory: Negative for cough, shortness of breath and wheezing.   Cardiovascular: Negative for chest pain, palpitations and leg swelling.  Gastrointestinal: Negative for blood in stool, constipation, diarrhea, heartburn and melena.  Genitourinary: Negative.   Skin: Negative for itching and rash.  Neurological: Positive for dizziness and headaches. Negative for sensory change.  Psychiatric/Behavioral: Positive for memory loss. Negative for depression. The patient has insomnia. The patient is not nervous/anxious.     Family history- Review and unchanged  Social history- Review and unchanged  Physical Exam: BP 124/82   Pulse (!) 52   Temp (!) 97.4 F (36.3 C)   Resp 16   Ht 6' (1.829 m)   Wt 177 lb 12.8 oz (80.6 kg)   BMI 24.11 kg/m  Wt Readings from Last 3 Encounters:  10/24/17 177 lb 12.8 oz (80.6 kg)  10/15/17 179 lb (81.2 kg)  08/22/17 175 lb 6.4 oz (79.6 kg)    General Appearance: Well nourished well developed, in no apparent distress. Eyes: PERRLA, EOMs, conjunctiva no swelling or erythema ENT/Mouth: Ear canals normal without obstruction, swelling, erythma, discharge.  TMs normal bilaterally.  Oropharynx moist, clear, without exudate, or postoropharyngeal swelling. Neck: Supple, thyroid normal,no cervical adenopathy  Respiratory: Respiratory effort normal, Breath sounds clear A&P without rhonchi, wheeze, or rale.  No retractions, no accessory usage. Cardio: RRR with no MRGs. Brisk peripheral pulses without edema.  Abdomen: Soft, + BS,  Non tender, no guarding, rebound,  Masses. Upper ventral hernia Musculoskeletal: Full ROM, 5/5 strength, Normal gait Skin: Warm,  dry without rashes, lesions, ecchymosis.  Neuro: Awake and oriented X 3, Cranial nerves intact. Normal muscle tone, no cerebellar symptoms. Psych: Normal affect, Insight and Judgment appropriate.    Vicie Mutters, PA-C 4:38 PM [facility]

## 2017-10-24 NOTE — Procedures (Signed)
    History:   Edwin Webster is a 56 year old patient with a history of problems with memory and cognitive dysfunction.  The patient has had some episodes of blurriness of vision and visual obscurations.  He does report some intermittent episodes of vertigo.  The patient is being evaluated for these episodes.  This is a routine EEG.  No skull defects are noted.  Medications include aspirin, Zebeta, vitamin D, Lexapro, Flonase, folic acid, Lipitor, Xyzal, and Duexis.  EEG classification: Normal awake  Description of the recording: The background rhythms of this recording consists of a fairly well modulated medium amplitude alpha rhythm of 8 Hz that is reactive to eye opening and closure. As the record progresses, the patient appears to remain in the waking state throughout the recording. Photic stimulation was performed, resulting in a bilateral and symmetric photic driving response. Hyperventilation was also performed, resulting in a minimal buildup of the background rhythm activities without significant slowing seen. At no time during the recording does there appear to be evidence of spike or spike wave discharges or evidence of focal slowing. EKG monitor shows no evidence of cardiac rhythm abnormalities with a heart rate of 66.  Impression: This is a normal EEG recording in the waking state. No evidence of ictal or interictal discharges are seen.

## 2017-10-24 NOTE — Patient Instructions (Signed)
I think it is possible that you have sleep apnea. It can cause interrupted sleep, headaches, frequent awakenings, fatigue, dry mouth, fast/slow heart beats, memory issues, anxiety/depression, swelling, numbness tingling hands/feet, weight gain, shortness of breath, and the list goes on. Sleep apnea needs to be ruled out because if it is left untreated it does eventually lead to abnormal heart beats, lung failure or heart failure as well as increasing the risk of heart attack and stroke. There are masks you can wear OR a mouth piece that I can give you information about. Often times though people feel MUCH better after getting treatment.   Sleep Apnea  Sleep apnea is a sleep disorder characterized by abnormal pauses in breathing while you sleep. When your breathing pauses, the level of oxygen in your blood decreases. This causes you to move out of deep sleep and into light sleep. As a result, your quality of sleep is poor, and the system that carries your blood throughout your body (cardiovascular system) experiences stress. If sleep apnea remains untreated, the following conditions can develop:  High blood pressure (hypertension).  Coronary artery disease.  Inability to achieve or maintain an erection (impotence).  Impairment of your thought process (cognitive dysfunction). There are three types of sleep apnea: 1. Obstructive sleep apnea--Pauses in breathing during sleep because of a blocked airway. 2. Central sleep apnea--Pauses in breathing during sleep because the area of the brain that controls your breathing does not send the correct signals to the muscles that control breathing. 3. Mixed sleep apnea--A combination of both obstructive and central sleep apnea.  RISK FACTORS The following risk factors can increase your risk of developing sleep apnea:  Being overweight.  Smoking.  Having narrow passages in your nose and throat.  Being of older age.  Being male.  Alcohol use.   Sedative and tranquilizer use.  Ethnicity. Among individuals younger than 35 years, African Americans are at increased risk of sleep apnea. SYMPTOMS   Difficulty staying asleep.  Daytime sleepiness and fatigue.  Loss of energy.  Irritability.  Loud, heavy snoring.  Morning headaches.  Trouble concentrating.  Forgetfulness.  Decreased interest in sex. DIAGNOSIS  In order to diagnose sleep apnea, your caregiver will perform a physical examination. Your caregiver may suggest that you take a home sleep test. Your caregiver may also recommend that you spend the night in a sleep lab. In the sleep lab, several monitors record information about your heart, lungs, and brain while you sleep. Your leg and arm movements and blood oxygen level are also recorded. TREATMENT The following actions may help to resolve mild sleep apnea:  Sleeping on your side.   Using a decongestant if you have nasal congestion.   Avoiding the use of depressants, including alcohol, sedatives, and narcotics.   Losing weight and modifying your diet if you are overweight. There also are devices and treatments to help open your airway:  Oral appliances. These are custom-made mouthpieces that shift your lower jaw forward and slightly open your bite. This opens your airway.  Devices that create positive airway pressure. This positive pressure "splints" your airway open to help you breathe better during sleep. The following devices create positive airway pressure:  Continuous positive airway pressure (CPAP) device. The CPAP device creates a continuous level of air pressure with an air pump. The air is delivered to your airway through a mask while you sleep. This continuous pressure keeps your airway open.  Nasal expiratory positive airway pressure (EPAP) device. The EPAP  device creates positive air pressure as you exhale. The device consists of single-use valves, which are inserted into each nostril and held in  place by adhesive. The valves create very little resistance when you inhale but create much more resistance when you exhale. That increased resistance creates the positive airway pressure. This positive pressure while you exhale keeps your airway open, making it easier to breath when you inhale again.  Bilevel positive airway pressure (BPAP) device. The BPAP device is used mainly in patients with central sleep apnea. This device is similar to the CPAP device because it also uses an air pump to deliver continuous air pressure through a mask. However, with the BPAP machine, the pressure is set at two different levels. The pressure when you exhale is lower than the pressure when you inhale.  Surgery. Typically, surgery is on Diastasis Recti Diastasis recti is when the muscles of the abdomen (rectus abdominis muscles) become thin and separate. The result is a wider space between the right and left abdomen (abdominal) muscles. This wider space between the muscles may cause a bulge in the middle of your abdomen. You may notice this bulge when you are straining or when you sit up from a lying down position. Diastasis recti can affect men and women. It is most common among pregnant women, infants, people who are obese, and people who have had abdominal surgery. Exercise or surgical treatment may help correct it. What are the causes? Common causes of this condition include: Pregnancy. The growing uterus puts pressure on the abdominal muscles, which causes the muscles to separate. Obesity. Excess fat puts pressure on abdominal muscles. Weightlifting. Some abdomen exercises. Advanced age. Genetics. Prior abdominal surgery.  What increases the risk? This condition is more likely to develop in: Women. Newborns, especially newborns who are born early (prematurely).  What are the signs or symptoms? Common symptoms of this condition include: A bulge in the middle of the abdomen. You will notice it most when  you sit up or strain. Pain in the low back, pelvis, or hips. Constipation. Inability to control when you urinate (urinary incontinence). Bloating. Poor posture.  How is this diagnosed? This condition is diagnosed with a physical exam. Your health care provider will ask you to lie flat on your back and do a crunch or half sit-up. If you have diastasis recti, a vertical bulge will appear between your abdominal muscles in the center of your abdomen. Your health care provider will measure the gap between your muscles with one of the following: A medical device used to measure the space between two objects (caliper). A tape measure. CT scan. Ultrasound. Finger spaces. Your health care provider will measure the space using their fingers.  How is this treated? If your muscle separation is not too large, you may not need treatment. However, if you are a woman who plans to become pregnant again, you should treat this condition before your next pregnancy. Treatment may include: Physical therapy to strengthen and tighten your abdominal muscles. Lifestyle changes such as weight loss and exercise. Over-the-counter pain medicines as needed. Surgery to correct the separation.  Follow these instructions at home: Activity Return to your normal activities as told by your health care provider. Ask your health care provider what activities are safe for you. When lifting weights or doing exercises using your abdominal muscles or the muscles in the center of your body that give stability (core muscles), make sure you are doing your exercises and movements correctly. Proper form  can help to prevent the condition from happening again. General instructions If you are overweight, ask your health care provider for help with weight loss. Losing even a small amount of weight can help to improve your diastasis recti. Take over-the-counter or prescription medicines only as told by your health care provider. Do not  strain. Straining can make the separation worse. Examples of straining include: Pushing hard to have a bowel movement, such as due to constipation. Lifting heavy objects, including children. Standing up and sitting down. Take steps to prevent constipation: Drink enough fluid to keep your urine clear or pale yellow. Take over-the-counter or prescription medicines only as directed. Eat foods that are high in fiber, such as fresh fruits and vegetables, whole grains, and beans. Limit foods that are high in fat and processed sugars, such as fried and sweet foods. Contact a health care provider if: You notice a new bulge in your abdomen. Get help right away if: You experience severe discomfort in your abdomen. You develop severe abdominal pain along with nausea, vomiting, or fever. Summary Diastasis recti is when the abdomen (abdominal) muscles become thin and separate. Your abdomen will stick out because the space between your right and left abdomen muscles has widened. The most common symptom is a bulge in your abdomen. You will notice it most when you sit up or are straining. This condition is diagnosed during a physical exam. If the abdomen separation is not too big, you may choose not to have treatment. Otherwise, you may need to undergo physical therapy or surgery. This information is not intended to replace advice given to you by your health care provider. Make sure you discuss any questions you have with your health care provider. Document Released: 01/07/2017 Document Revised: 01/07/2017 Document Reviewed: 01/07/2017 Elsevier Interactive Patient Education  2018 [employer]. ly done if you cannot comply with less invasive treatments or if the less invasive treatments do not improve your condition. Surgery involves removing excess tissue in your airway to create a wider passage way. Document Released: 11/02/2002 Document Revised: 03/09/2013 Document Reviewed: 03/20/2012 [facility] Patient  Information 2015 [place], [place]. This information is not intended to replace advice given to you by your health care provider. Make sure you discuss any questions you have with your health care provider.    Ventral Hernia A ventral hernia is a bulge of tissue from inside the abdomen that pushes through a weak area of the muscles that form the front wall of the abdomen. The tissues inside the abdomen are inside a sac (peritoneum). These tissues include the small intestine, large intestine, and the fatty tissue that covers the intestines (omentum). Sometimes, the bulge that forms a hernia contains intestines. Other hernias contain only fat. Ventral hernias do not go away without surgical treatment. There are several types of ventral hernias. You may have:  A hernia at an incision site from previous abdominal surgery (incisional hernia).  A hernia just above the belly button (epigastric hernia), or at the belly button (umbilical hernia). These types of hernias can develop from heavy lifting or straining.  A hernia that comes and goes (reducible hernia). It may be visible only when you lift or strain. This type of hernia can be pushed back into the abdomen (reduced).  A hernia that traps abdominal tissue inside the hernia (incarcerated hernia). This type of hernia does not reduce.  A hernia that cuts off blood flow to the tissues inside the hernia (strangulated hernia). The tissues can start to die  if this happens. This is a very painful bulge that cannot be reduced. A strangulated hernia is a medical emergency.  What are the causes? This condition is caused by abdominal tissue putting pressure on an area of weakness in the abdominal muscles. What increases the risk? The following factors may make you more likely to develop this condition:  Being male.  Being 19 or older.  Being overweight or obese.  Having had previous abdominal surgery, especially if there was an infection after  surgery.  Having had an injury to the abdominal wall.  Having had several pregnancies.  Having a buildup of fluid inside the abdomen (ascites).  What are the signs or symptoms? The only symptom of a ventral hernia may be a painless bulge in the abdomen. A reducible hernia may be visible only when you strain, cough, or lift. Other symptoms may include:  Dull pain.  A feeling of pressure.  Signs and symptoms of a strangulated hernia may include:  Increasing pain.  Nausea and vomiting.  Pain when pressing on the hernia.  The skin over the hernia turning red or purple.  Constipation.  Blood in the stool (feces).  How is this diagnosed? This condition may be diagnosed based on:  Your symptoms.  Your medical history.  A physical exam. You may be asked to cough or strain while standing. These actions increase the pressure inside your abdomen and force the hernia through the opening in your muscles. Your health care provider may try to reduce the hernia by pressing on it.  Imaging studies, such as an ultrasound or CT scan.  How is this treated? This condition is treated with surgery. If you have a strangulated hernia, surgery is done as soon as possible. If your hernia is small and not incarcerated, you may be asked to lose some weight before surgery. Follow these instructions at home:  Follow instructions from your health care provider about eating or drinking restrictions.  If you are overweight, your health care provider may recommend that you increase your activity level and eat a healthier diet.  Do not lift anything that is heavier than 10 lb (4.5 kg).  Return to your normal activities as told by your health care provider. Ask your health care provider what activities are safe for you. You may need to avoid activities that increase pressure on your hernia.  Take over-the-counter and prescription medicines only as told by your health care provider.  Keep all  follow-up visits as told by your health care provider. This is important. Contact a health care provider if:  Your hernia gets larger.  Your hernia becomes painful. Get help right away if:  Your hernia becomes increasingly painful.  You have pain along with any of the following: ? Changes in skin color in the area of the hernia. ? Nausea. ? Vomiting. ? Fever. Summary  A ventral hernia is a bulge of tissue from inside the abdomen that pushes through a weak area of the muscles that form the front wall of the abdomen.  This condition is treated with surgery, which may be urgent depending on your hernia.  Do not lift anything that is heavier than 10 lb (4.5 kg), and follow activity instructions from your health care provider. This information is not intended to replace advice given to you by your health care provider. Make sure you discuss any questions you have with your health care provider. Document Released: 10/29/2012 Document Revised: 06/29/2016 Document Reviewed: 06/29/2016 Elsevier Interactive Patient Education  2018 [employer]  

## 2017-10-25 LAB — HEPATIC FUNCTION PANEL
AG RATIO: 1.9 (calc) (ref 1.0–2.5)
ALKALINE PHOSPHATASE (APISO): 100 U/L (ref 40–115)
ALT: 11 U/L (ref 9–46)
AST: 16 U/L (ref 10–35)
Albumin: 4.5 g/dL (ref 3.6–5.1)
BILIRUBIN INDIRECT: 0.3 mg/dL (ref 0.2–1.2)
BILIRUBIN TOTAL: 0.4 mg/dL (ref 0.2–1.2)
Bilirubin, Direct: 0.1 mg/dL (ref 0.0–0.2)
Globulin: 2.4 g/dL (calc) (ref 1.9–3.7)
Total Protein: 6.9 g/dL (ref 6.1–8.1)

## 2017-10-25 LAB — CBC WITH DIFFERENTIAL/PLATELET
BASOS ABS: 58 {cells}/uL (ref 0–200)
Basophils Relative: 0.9 %
EOS ABS: 128 {cells}/uL (ref 15–500)
Eosinophils Relative: 2 %
HEMATOCRIT: 39.4 % (ref 38.5–50.0)
HEMOGLOBIN: 13.7 g/dL (ref 13.2–17.1)
LYMPHS ABS: 2362 {cells}/uL (ref 850–3900)
MCH: 32.4 pg (ref 27.0–33.0)
MCHC: 34.8 g/dL (ref 32.0–36.0)
MCV: 93.1 fL (ref 80.0–100.0)
MONOS PCT: 13.1 %
MPV: 10.3 fL (ref 7.5–12.5)
NEUTROS ABS: 3014 {cells}/uL (ref 1500–7800)
Neutrophils Relative %: 47.1 %
Platelets: 293 10*3/uL (ref 140–400)
RBC: 4.23 10*6/uL (ref 4.20–5.80)
RDW: 13.2 % (ref 11.0–15.0)
Total Lymphocyte: 36.9 %
WBC mixed population: 838 cells/uL (ref 200–950)
WBC: 6.4 10*3/uL (ref 3.8–10.8)

## 2017-10-25 LAB — MAGNESIUM: Magnesium: 2.3 mg/dL (ref 1.5–2.5)

## 2017-10-25 LAB — BASIC METABOLIC PANEL WITH GFR
BUN: 13 mg/dL (ref 7–25)
CO2: 29 mmol/L (ref 20–32)
CREATININE: 1.11 mg/dL (ref 0.70–1.33)
Calcium: 9.6 mg/dL (ref 8.6–10.3)
Chloride: 103 mmol/L (ref 98–110)
GFR, EST NON AFRICAN AMERICAN: 73 mL/min/{1.73_m2} (ref >=60)
GFR, Est African American: 84 mL/min/{1.73_m2} (ref >=60)
Glucose, Bld: 83 mg/dL (ref 65–99)
Potassium: 4.6 mmol/L (ref 3.5–5.3)
SODIUM: 140 mmol/L (ref 135–146)

## 2017-10-25 LAB — LIPID PANEL
CHOL/HDL RATIO: 5.2 (calc) — AB (ref <5.0)
Cholesterol: 206 mg/dL — ABNORMAL HIGH (ref <200)
HDL: 40 mg/dL — AB (ref >40)
LDL Cholesterol (Calc): 143 mg/dL (calc) — ABNORMAL HIGH
NON-HDL CHOLESTEROL (CALC): 166 mg/dL — AB (ref <130)
Triglycerides: 116 mg/dL (ref <150)

## 2017-10-25 LAB — TSH: TSH: 2.91 mIU/L (ref 0.40–4.50)

## 2017-10-28 ENCOUNTER — Telehealth: Payer: Self-pay

## 2017-10-28 NOTE — Telephone Encounter (Signed)
Patient is aware of EEG results. He is wondering if we are going to set up a sleep study to check for sleep apnea? He says that it was discussed at the [place] but he has not heard anything back about it. I see the referral for Neuropsych but I didn't see one for a sleep study. Would you mind looking into this and calling him back to let him know?

## 2017-10-28 NOTE — Telephone Encounter (Signed)
-----   Message from Lester Lake Holm, RN sent at 10/28/2017  3:55 PM EST -----   ----- Message ----- From: Star Age, MD Sent: 10/24/2017   5:34 PM To: Lester Craig Beach, RN  Please call and advise the patient that the EEG or brain wave test we performed was reported as normal in the awake state. We checked for abnormal electrical discharges in the brain waves and the report suggested normal findings. No further action is required on this test at this time. Please remind patient to keep any upcoming appointments or tests and to call us with any interim questions, concerns, problems or updates. Thanks,  Star Age, MD, PhD

## 2017-10-29 ENCOUNTER — Other Ambulatory Visit: Payer: Self-pay | Admitting: Physician Assistant

## 2017-10-29 DIAGNOSIS — F32A Depression, unspecified: Secondary | ICD-10-CM

## 2017-10-29 DIAGNOSIS — F329 Major depressive disorder, single episode, unspecified: Secondary | ICD-10-CM

## 2017-11-12 ENCOUNTER — Other Ambulatory Visit: Payer: Self-pay | Admitting: Internal Medicine

## 2017-11-12 DIAGNOSIS — R0989 Other specified symptoms and signs involving the circulatory and respiratory systems: Secondary | ICD-10-CM

## 2017-11-20 ENCOUNTER — Ambulatory Visit (INDEPENDENT_AMBULATORY_CARE_PROVIDER_SITE_OTHER): Payer: PRIVATE HEALTH INSURANCE | Admitting: Neurology

## 2017-11-20 DIAGNOSIS — R419 Unspecified symptoms and signs involving cognitive functions and awareness: Secondary | ICD-10-CM

## 2017-11-20 DIAGNOSIS — R413 Other amnesia: Secondary | ICD-10-CM

## 2017-11-20 DIAGNOSIS — G4761 Periodic limb movement disorder: Secondary | ICD-10-CM | POA: Diagnosis not present

## 2017-11-20 DIAGNOSIS — R4789 Other speech disturbances: Secondary | ICD-10-CM

## 2017-11-20 DIAGNOSIS — R519 Headache, unspecified: Secondary | ICD-10-CM

## 2017-11-20 DIAGNOSIS — R42 Dizziness and giddiness: Secondary | ICD-10-CM

## 2017-11-20 DIAGNOSIS — R51 Headache: Secondary | ICD-10-CM

## 2017-11-20 DIAGNOSIS — R0683 Snoring: Secondary | ICD-10-CM

## 2017-11-20 DIAGNOSIS — R351 Nocturia: Secondary | ICD-10-CM

## 2017-11-20 DIAGNOSIS — G4720 Circadian rhythm sleep disorder, unspecified type: Secondary | ICD-10-CM

## 2017-12-04 ENCOUNTER — Telehealth: Payer: Self-pay | Admitting: Neurology

## 2017-12-04 NOTE — Telephone Encounter (Signed)
-----   Message from Star Age, MD sent at 12/04/2017  7:55 AM EST ----- Patient referred by Dr. Melford Aase, seen by me on 10/15/17, diagnostic PSG on 11/21/17.   Please call and notify the patient that the recent sleep study did not show any significant obstructive sleep apnea, had mild to moderate snoring. For disturbing snoring, an oral appliance (through a qualified dentist) can be considered.  He had some leg twitching, but no significant sleep disruption from these. We can go over details at his FU appt.  If he feels fairly stable, it may be more productive, if we delay FEB appt to AFTER he sees Dr. Bonita Quin in April, maybe sometime in May? Otherwise, keep appt in Feb.    Thanks,  Star Age, MD, PhD [facility])

## 2017-12-04 NOTE — Progress Notes (Signed)
Patient referred by Dr. Melford Aase, seen by me on 10/15/17, diagnostic PSG on 11/21/17.   Please call and notify the patient that the recent sleep study did not show any significant obstructive sleep apnea, had mild to moderate snoring. For disturbing snoring, an oral appliance (through a qualified dentist) can be considered.  He had some leg twitching, but no significant sleep disruption from these. We can go over details at his FU appt.  If he feels fairly stable, it may be more productive, if we delay FEB appt to AFTER he sees Dr. Bonita Quin in April, maybe sometime in May? Otherwise, keep appt in Feb.    Thanks,  Star Age, MD, PhD [facility])

## 2017-12-04 NOTE — Procedures (Signed)
PATIENT'S NAME:  Edwin Webster, Edwin Webster DOB:      [birth year]      MR#:    [id number]     DATE OF RECORDING: 11/20/2017 REFERRING M.D.:  Unk Pinto, MD Study Performed:   Baseline Polysomnogram HISTORY: 57 year old man with a history of hypertension, hyperlipidemia, vitamin D deficiency, anxiety, allergies, rheumatoid arthritis, kidney stones, gout, diverticulitis, depression, anemia, and COPD (chart review), who reports cognitive issues, vertiginous symptoms, snoring and sleep disruption. The patient's weight 179 pounds with a height of 72 (inches), resulting in a BMI of 24.2 kg/m2. The patient's neck circumference measured 16.5 inches.  CURRENT MEDICATIONS: Aspirin, Zebeta, Lexapro, Flonase, Folvite, Duexis, Xyzal, Lipitor   PROCEDURE:  This is a multichannel digital polysomnogram utilizing the Somnostar 11.2 system.  Electrodes and sensors were applied and monitored per AASM Specifications.   EEG, EOG, Chin and Limb EMG, were sampled at 200 Hz.  ECG, Snore and Nasal Pressure, Thermal Airflow, Respiratory Effort, CPAP Flow and Pressure, Oximetry was sampled at 50 Hz. Digital video and audio were recorded.      BASELINE STUDY  Lights Out was at 22:26 and Lights On at 05:01.  Total recording time (TRT) was 395 minutes, with a total sleep time (TST) of  274 minutes.   The patient's sleep latency was 24.5 minutes.  REM latency was 310.5 minutes, which is markedly delayed. The sleep efficiency was 69.4%, which is reduced.     SLEEP ARCHITECTURE: WASO (Wake after sleep onset) was 105 minutes with moderate sleep fragmentation noted. There were 44 minutes in Stage N1, 57 minutes Stage N2, 158 minutes Stage N3 and 15 minutes in Stage REM.  The percentage of Stage N1 was 16.1%, which is increased, Stage N2 was 20.8%, Stage N3 was 57.7%, which is increased, and Stage R (REM sleep) was 5.5%, which is markedly reduced.   The arousals were noted as: 22 were spontaneous, 8 were associated with PLMs, 4 were associated  with respiratory events. Audio and video analysis did not show any abnormal or unusual movements, behaviors, phonations or vocalizations. The patient took no bathroom breaks. Mild to moderate snoring was noted. The EKG was in keeping with normal sinus rhythm (NSR).  RESPIRATORY ANALYSIS:  There were a total of 7 respiratory events:  0 obstructive apneas, 1 central apneas and 0 mixed apneas with a total of 1 apneas and an apnea index (AI) of .2 /hour. There were 6 hypopneas with a hypopnea index of 1.3 /hour. The patient also had 0 respiratory event related arousals (RERAs).      The total APNEA/HYPOPNEA INDEX (AHI) was 1.5/hour and the total RESPIRATORY DISTURBANCE INDEX was 1.5 /hour.  0 events occurred in REM sleep and 13 events in NREM. The REM AHI was 0 /hour, versus a non-REM AHI of 1.6. The patient spent 178.5 minutes of total sleep time in the supine position and 96 minutes in non-supine. The supine AHI was 2.3 versus a non-supine AHI of 0.0.  OXYGEN SATURATION & C02:  The Wake baseline 02 saturation was 98%, with the lowest being 87%. Time spent below 89% saturation equaled 1 minutes.  PERIODIC LIMB MOVEMENTS: The patient had a total of 156 Periodic Limb Movements.  The Periodic Limb Movement (PLM) index was 34.2 and the PLM Arousal index was 1.8/hour.  Post-study, the patient indicated that sleep was than usual.   IMPRESSION:  1. Periodic Limb Movement Disorder (PLMD) 2. Primary Snoring 3. Dysfunctions associated with sleep stages or arousal from sleep  RECOMMENDATIONS:  1.  This study does not demonstrate any significant obstructive or central sleep disordered breathing with the exception of mild to at times moderate snoring. For disturbing snoring, an oral appliance (through a qualified dentist) can be considered.  2. Moderate PLMs (periodic limb movements of sleep) were noted during this study with no significant arousals; clinical correlation is recommended. Medication effect from  the antidepressant medication should be considered.  3. This study shows sleep fragmentation and abnormal sleep stage percentages; these are nonspecific findings and per se do not signify an intrinsic sleep disorder or a cause for the patient's sleep-related symptoms. Causes include (but are not limited to) the first night effect of the sleep study, circadian rhythm disturbances, medication effect or an underlying mood disorder or medical problem.  4. The patient should be cautioned not to drive, work at heights, or operate dangerous or heavy equipment when tired or sleepy. Review and reiteration of good sleep hygiene measures should be pursued with any patient. 5. The patient will be seen in follow-up by Dr. Rexene Alberts at [facility] for discussion of the test results and further management strategies. The referring provider will be notified of the test results.  I certify that I have reviewed the entire raw data recording prior to the issuance of this report in accordance with the Standards of Accreditation of the American Academy of Sleep Medicine (AASM)   Star Age, MD, PhD Diplomat, American Board of Psychiatry and Neurology (Neurology and Sleep Medicine)

## 2017-12-04 NOTE — Telephone Encounter (Signed)
Called the patient to go over sleep study results. I informed the patient that he did not have sleep apnea. The patient questioned if his oxygen levels had dropped cause at some point there was oxygen applied in the night and the patient states that he felt really good the next day. I read over the sleep study and did not indicate that his oxygen level dropped requiring oxygen. The patient verbalized understanding. I offered the option for dental device to help with snoring and the patient declined at this time. I informed the patient that if he would like we could push his apt out to first week of may after he had been seen by Dr Halina Andreas, pt agreed to this. Apt was made for may 1st. Pt verbalized understanding. Pt had no questions at this time but was encouraged to call back if questions arise.

## 2018-01-15 ENCOUNTER — Ambulatory Visit: Payer: PRIVATE HEALTH INSURANCE | Admitting: Neurology

## 2018-01-23 ENCOUNTER — Other Ambulatory Visit: Payer: Self-pay | Admitting: Physician Assistant

## 2018-01-23 DIAGNOSIS — F329 Major depressive disorder, single episode, unspecified: Secondary | ICD-10-CM

## 2018-01-23 DIAGNOSIS — F32A Depression, unspecified: Secondary | ICD-10-CM

## 2018-01-23 MED ORDER — ESCITALOPRAM OXALATE 20 MG PO TABS: 20.0000 mg | ORAL_TABLET | Freq: Every day | ORAL | 1 refills | Status: DC

## 2018-02-04 NOTE — Progress Notes (Deleted)
FOLLOW UP  Assessment and Plan:   Hypertension Well controlled with current medications  Monitor blood pressure at home; patient to call if consistently greater than 130/80 Continue DASH diet.   Reminder to go to the ER if any CP, SOB, nausea, dizziness, severe HA, changes vision/speech, left arm numbness and tingling and jaw pain.  Cholesterol Currently above goal on high dose statin; *** Continue low cholesterol diet and exercise.  Check lipid panel.   Other abnormal glucose Recent A1Cs at goal Discussed diet/exercise, weight management  Defer A1C; check BMP  Normal weight (BMI 24)  Continue to recommend diet heavy in fruits and veggies and low in animal meats, cheeses, and dairy products, appropriate calorie intake Discuss exercise recommendations routinely Continue to monitor weight at each visit  Vitamin D Def Near goal at last visit; continue supplementation to maintain goal of 70-100 Defer Vit D level  Continue diet and meds as discussed. Further disposition pending results of labs. Discussed med's effects and SE's.   Over 30 minutes of exam, counseling, chart review, and critical decision making was performed.   Future Appointments  Date Time Provider [place]  02/05/2018  4:30 PM Liane Comber, NP [facility] None  03/04/2018  9:00 AM Kandis Nab, PsyD [facility] None  03/12/2018 10:30 AM [facility]UROPSY[facility] TECH [facility] None  03/24/2018 10:00 AM Kandis Nab, PsyD [facility] None  03/26/2018  3:00 PM Star Age, MD [facility] None    ----------------------------------------------------------------------------------------------------------------------  HPI 57 y.o. male  presents for 3 month follow up on hypertension, cholesterol, glucose management, weight and vitamin D deficiency. 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   BMI is There is no height or weight on file to  calculate BMI., he {HAS HAS IOX:73532} been working on diet and exercise. Wt Readings from Last 3 Encounters:  10/24/17 177 lb 12.8 oz (80.6 kg)  10/15/17 179 lb (81.2 kg)  08/22/17 175 lb 6.4 oz (79.6 kg)   His blood pressure {HAS HAS NOT:18834} been controlled at home, today their BP is    He {DOES_DOES DJM:42683} workout. He denies chest pain, shortness of breath, dizziness.   He is on cholesterol medication (atorvastatin *** ) and denies myalgias. His cholesterol is not at goal. The cholesterol last visit was:   Lab Results  Component Value Date   CHOL 206 (H) 10/24/2017   HDL 40 (L) 10/24/2017   LDLCALC 141 (H) 04/16/2017   TRIG 116 10/24/2017   CHOLHDL 5.2 (H) 10/24/2017    He {Has/has not:18111} been working on diet and exercise for hx of prediabetes, and denies {Symptoms; diabetes w/o none:19199}. Last A1C in the office was:  Lab Results  Component Value Date   HGBA1C 5.4 04/16/2017   Patient is on Vitamin D supplement but remained below goal of 70 at last check:    Lab Results  Component Value Date   VD25OH 52 10/16/2016        Current Medications:  Current Outpatient Medications on File Prior to Visit  Medication Sig  . aspirin EC 81 MG tablet Take 81 mg daily by mouth.  Marland Kitchen atorvastatin (LIPITOR) 80 MG tablet Take 1/2 to 1 tablet as directed for cholesterol  . bisoprolol (ZEBETA) 10 MG tablet TAKE 1/2 TO 1 TABLET DAILY FOR BP  . Cholecalciferol (VITAMIN D-3) 5000 UNITS TABS Take 5,000 Units by mouth daily.  Marland Kitchen escitalopram (LEXAPRO) 20 MG tablet Take 1 tablet (20 mg  total) by mouth daily.  . fluticasone (FLONASE) 50 MCG/ACT nasal spray PLACE 2 SPRAYS INTO BOTH NOSTRILS DAILY. (Patient taking differently: PLACE 2 SPRAYS INTO BOTH NOSTRILS DAILY as needed.)  . folic acid (FOLVITE) 1 MG tablet Take 1 mg daily by mouth.  . Ibuprofen-Famotidine (DUEXIS) 800-26.6 MG TABS Take daily as needed by mouth.  . levocetirizine (XYZAL) 5 MG tablet TAKE 1 TABLET (5 MG TOTAL) BY  MOUTH EVERY EVENING.   No current facility-administered medications on file prior to visit.      Allergies:  Allergies  Allergen Reactions  . Humira [Adalimumab] Other (See Comments)    "blacked out", confused     Medical History:  Past Medical History:  Diagnosis Date  . Allergy   . Anemia   . Anxiety   . Arthritis   . Chest pain, non-cardiac   . COPD (chronic obstructive pulmonary disease) ([place])   . Depression   . Diverticulitis   . GERD (gastroesophageal reflux disease)   . Gout   . Hyperlipidemia   . Kidney stones   . Rheumatoid arthritis ([place])    hands   Family history- Reviewed and unchanged Social history- Reviewed and unchanged   Review of Systems:  Review of Systems  Constitutional: Negative for malaise/fatigue and weight loss.  HENT: Negative for hearing loss and tinnitus.   Eyes: Negative for blurred vision and double vision.  Respiratory: Negative for cough, shortness of breath and wheezing.   Cardiovascular: Negative for chest pain, palpitations, orthopnea, claudication and leg swelling.  Gastrointestinal: Negative for abdominal pain, blood in stool, constipation, diarrhea, heartburn, melena, nausea and vomiting.  Genitourinary: Negative.   Musculoskeletal: Negative for joint pain and myalgias.  Skin: Negative for rash.  Neurological: Negative for dizziness, tingling, sensory change, weakness and headaches.  Endo/Heme/Allergies: Negative for polydipsia.  Psychiatric/Behavioral: Negative.   All other systems reviewed and are negative.     Physical Exam: There were no vitals taken for this visit. Wt Readings from Last 3 Encounters:  10/24/17 177 lb 12.8 oz (80.6 kg)  10/15/17 179 lb (81.2 kg)  08/22/17 175 lb 6.4 oz (79.6 kg)   General Appearance: Well nourished, in no apparent distress. Eyes: PERRLA, EOMs, conjunctiva no swelling or erythema Sinuses: No Frontal/maxillary tenderness ENT/Mouth: Ext aud canals clear, TMs without erythema,  bulging. No erythema, swelling, or exudate on post pharynx.  Tonsils not swollen or erythematous. Hearing normal.  Neck: Supple, thyroid normal.  Respiratory: Respiratory effort normal, BS equal bilaterally without rales, rhonchi, wheezing or stridor.  Cardio: RRR with no MRGs. Brisk peripheral pulses without edema.  Abdomen: Soft, + BS.  Non tender, no guarding, rebound, hernias, masses. Lymphatics: Non tender without lymphadenopathy.  Musculoskeletal: Full ROM, 5/5 strength, {PSY - GAIT AND STATION:22860} gait Skin: Warm, dry without rashes, lesions, ecchymosis.  Neuro: Cranial nerves intact. No cerebellar symptoms.  Psych: Awake and oriented X 3, normal affect, Insight and Judgment appropriate.    [name], NP 10:42 AM [facility]

## 2018-02-05 ENCOUNTER — Ambulatory Visit: Payer: Self-pay | Admitting: Adult Health

## 2018-02-11 NOTE — Progress Notes (Signed)
FOLLOW UP  Assessment and Plan:   Hypertension Well controlled with current medications  Monitor blood pressure at home; patient to call if consistently greater than 130/80 Continue DASH diet.   Reminder to go to the ER if any CP, SOB, nausea, dizziness, severe HA, changes vision/speech, left arm numbness and tingling and jaw pain.  Cholesterol Currently above goal; currently on atorvastatin 40 mg daily Continue low cholesterol diet and exercise.  Check lipid panel.   Other abnormal glucose Recent A1Cs at goal Discussed diet/exercise, weight management  Defer A1C; check BMP  BMI 24 Recommended diet heavy in fruits and veggies and low in animal meats, cheeses, and dairy products, appropriate calorie intake Discussed exercise guidelines/recommendations Discussed ideal weight for height  Will follow up in 3 months  Vitamin D Def Last check below goal and remote; patient supplements with 5000 U daily Check vitamin D   Fatigue Patient reports ongoing fatigue for past year, known hx of testosterone deficiency for which he was prescribed injections; did not tolerate well and would decline this intervention.  Will check CBC, TSH, B12 today-  Recommended zinc 50 mg daily supplementation for natural testosterone boost, discussed lifestyle - cut down on sugar, exercise, hydration  Continue diet and meds as discussed. Further disposition pending results of labs. Discussed med's effects and SE's.   Over 30 minutes of exam, counseling, chart review, and critical decision making was performed.   Future Appointments  Date Time Provider [place]  02/12/2018  4:30 PM Liane Comber, NP [facility] None  03/04/2018  9:00 AM Kandis Nab, PsyD [facility] None  03/12/2018 10:30 AM [facility] NEUROPSY[facility] TECH [facility] None  03/24/2018 10:00 AM Kandis Nab, PsyD [facility] None  03/26/2018  3:00 PM Star Age, MD [facility] None     ----------------------------------------------------------------------------------------------------------------------  HPI 57 y.o. male  Presents accompanied by his wife for 3 month follow up on hypertension, cholesterol, glucose management, weight and vitamin D deficiency. 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 He c/o fatigue today, ongoing for over a year since fall from ladder with various injuries including pelvis fracture. Denies any problems sleeping. Does endorse AM fatigue and snoring but reports had sleep study and was "normal." He has hx of testosterone def and attempted supplement by injection but did not tolerate well and stopped.   At last check testosterone was:  Lab Results  Component Value Date   TESTOSTERONE 267 10/16/2016   BMI is Body mass index is 24.41 kg/m., he has not been working on diet and exercise. Walks long hours at work, eats lots of sweets at home per wife.  Wt Readings from Last 3 Encounters:  02/12/18 180 lb (81.6 kg)  10/24/17 177 lb 12.8 oz (80.6 kg)  10/15/17 179 lb (81.2 kg)   His blood pressure has been controlled at home, today their BP is BP: 140/80  He does not workout. He denies chest pain, shortness of breath, dizziness.   He is on cholesterol medication (atorvastatin 40 mg daily) and denies myalgias. His cholesterol is not at goal. The cholesterol last visit was:   Lab Results  Component Value Date   CHOL 206 (H) 10/24/2017   HDL 40 (L) 10/24/2017   LDLCALC 143 (H) 10/24/2017   TRIG 116 10/24/2017   CHOLHDL 5.2 (H) 10/24/2017    He has not been working on diet and exercise for glucose management (intermittent A1Cs in prediabetic range), and denies foot ulcerations, increased  appetite, nausea, paresthesia of the feet, polydipsia, polyuria, visual disturbances, vomiting and weight loss. Last A1C in the office was:  Lab Results  Component Value Date   HGBA1C 5.4 04/16/2017    Patient is on Vitamin D supplement but below goal of 70 at last check:    Lab Results  Component Value Date   VD25OH 52 10/16/2016       Current Medications:  Current Outpatient Medications on File Prior to Visit  Medication Sig  . aspirin EC 81 MG tablet Take 81 mg daily by mouth.  Marland Kitchen atorvastatin (LIPITOR) 80 MG tablet Take 1/2 to 1 tablet as directed for cholesterol  . bisoprolol (ZEBETA) 10 MG tablet TAKE 1/2 TO 1 TABLET DAILY FOR BP  . Cholecalciferol (VITAMIN D-3) 5000 UNITS TABS Take 5,000 Units by mouth daily.  Marland Kitchen escitalopram (LEXAPRO) 20 MG tablet Take 1 tablet (20 mg total) by mouth daily.  . fluticasone (FLONASE) 50 MCG/ACT nasal spray PLACE 2 SPRAYS INTO BOTH NOSTRILS DAILY. (Patient taking differently: PLACE 2 SPRAYS INTO BOTH NOSTRILS DAILY as needed.)  . folic acid (FOLVITE) 1 MG tablet Take 1 mg daily by mouth.  . Ibuprofen-Famotidine (DUEXIS) 800-26.6 MG TABS Take daily as needed by mouth.  . levocetirizine (XYZAL) 5 MG tablet TAKE 1 TABLET (5 MG TOTAL) BY MOUTH EVERY EVENING.   No current facility-administered medications on file prior to visit.      Allergies:  Allergies  Allergen Reactions  . Humira [Adalimumab] Other (See Comments)    "blacked out", confused     Medical History:  Past Medical History:  Diagnosis Date  . Allergy   . Anemia   . Anxiety   . Arthritis   . Chest pain, non-cardiac   . COPD (chronic obstructive pulmonary disease) ([place])   . Depression   . Diverticulitis   . GERD (gastroesophageal reflux disease)   . Gout   . Hyperlipidemia   . Kidney stones   . Rheumatoid arthritis ([place])    hands   Family history- Reviewed and unchanged Social history- Reviewed and unchanged   Review of Systems:  Review of Systems  Constitutional: Positive for malaise/fatigue. Negative for chills, fever and weight loss.  HENT: Negative for hearing loss and tinnitus.   Eyes: Negative for blurred vision and double vision.  Respiratory: Negative  for cough, shortness of breath and wheezing.   Cardiovascular: Positive for leg swelling (Mild, at end of day). Negative for chest pain, palpitations, orthopnea and claudication.  Gastrointestinal: Negative for abdominal pain, blood in stool, constipation, diarrhea, heartburn, melena, nausea and vomiting.  Genitourinary: Negative.   Musculoskeletal: Negative for joint pain and myalgias.  Skin: Negative for rash.  Neurological: Negative for dizziness, tingling, sensory change, weakness and headaches.  Endo/Heme/Allergies: Negative for polydipsia.  Psychiatric/Behavioral: Negative.  Negative for depression, memory loss and substance abuse. The patient is not nervous/anxious and does not have insomnia.   All other systems reviewed and are negative.   Physical Exam: BP 140/80   Pulse (!) 50   Temp (!) 97.5 F (36.4 C)   Ht 6' (1.829 m)   Wt 180 lb (81.6 kg)   SpO2 97%   BMI 24.41 kg/m  Wt Readings from Last 3 Encounters:  02/12/18 180 lb (81.6 kg)  10/24/17 177 lb 12.8 oz (80.6 kg)  10/15/17 179 lb (81.2 kg)   General Appearance: Well nourished, in no apparent distress. Eyes: PERRLA, EOMs, conjunctiva no swelling or erythema Sinuses: No Frontal/maxillary tenderness ENT/Mouth: Ext aud canals  clear, TMs without erythema, bulging. No erythema, swelling, or exudate on post pharynx.  Tonsils not swollen or erythematous. Hearing normal.  Neck: Supple, thyroid normal.  Respiratory: Respiratory effort normal, BS equal bilaterally without rales, rhonchi, wheezing or stridor.  Cardio: RRR with no MRGs. Brisk peripheral pulses with mild non-pitting edema to bilateral ankles.  Abdomen: Soft, + BS.  Non tender, no guarding, rebound, hernias, masses. Lymphatics: Non tender without lymphadenopathy.  Musculoskeletal: Full ROM, 5/5 strength, Normal gait Skin: Warm, dry without rashes, lesions, ecchymosis.  Neuro: Cranial nerves intact. No cerebellar symptoms.  Psych: Awake and oriented X 3, normal  affect, Insight and Judgment appropriate.    [name], NP 4:13 PM [facility]

## 2018-02-12 ENCOUNTER — Encounter: Payer: Self-pay | Admitting: Adult Health

## 2018-02-12 ENCOUNTER — Ambulatory Visit (INDEPENDENT_AMBULATORY_CARE_PROVIDER_SITE_OTHER): Payer: 59 | Admitting: Adult Health

## 2018-02-12 VITALS — BP 140/80 | HR 50 | Temp 97.5°F | Ht 72.0 in | Wt 180.0 lb

## 2018-02-12 DIAGNOSIS — Z6824 Body mass index (BMI) 24.0-24.9, adult: Secondary | ICD-10-CM

## 2018-02-12 DIAGNOSIS — E559 Vitamin D deficiency, unspecified: Secondary | ICD-10-CM | POA: Diagnosis not present

## 2018-02-12 DIAGNOSIS — R5383 Other fatigue: Secondary | ICD-10-CM | POA: Diagnosis not present

## 2018-02-12 DIAGNOSIS — I10 Essential (primary) hypertension: Secondary | ICD-10-CM

## 2018-02-12 DIAGNOSIS — R7309 Other abnormal glucose: Secondary | ICD-10-CM | POA: Diagnosis not present

## 2018-02-12 DIAGNOSIS — Z79899 Other long term (current) drug therapy: Secondary | ICD-10-CM

## 2018-02-12 DIAGNOSIS — M0600 Rheumatoid arthritis without rheumatoid factor, unspecified site: Secondary | ICD-10-CM | POA: Diagnosis not present

## 2018-02-12 DIAGNOSIS — E782 Mixed hyperlipidemia: Secondary | ICD-10-CM

## 2018-02-12 MED ORDER — FAMOTIDINE 40 MG PO TABS: ORAL_TABLET | ORAL | 1 refills | Status: DC

## 2018-02-12 MED ORDER — IBUPROFEN 800 MG PO TABS: 800.0000 mg | ORAL_TABLET | Freq: Every day | ORAL | 1 refills | Status: DC | PRN

## 2018-02-12 NOTE — Patient Instructions (Addendum)
Start tumeric+bioprene (black pepper fruit) supplement daily  Zinc 50 mg daily for testosterone  Exercise + cut down on sugar   Edema Edema is an abnormal buildup of fluids in your bodytissues. Edema is somewhatdependent on gravity to pull the fluid to the lowest place in your body. That makes the condition more common in the legs and thighs (lower extremities). Painless swelling of the feet and ankles is common and becomes more likely as you get older. It is also common in looser tissues, like around your eyes. When the affected area is squeezed, the fluid may move out of that spot and leave a dent for a few moments. This dent is called pitting. What are the causes? There are many possible causes of edema. Eating too much salt and being on your feet or sitting for a long time can cause edema in your legs and ankles. Hot weather may make edema worse. Common medical causes of edema include:  Heart failure.  Liver disease.  Kidney disease.  Weak blood vessels in your legs.  Cancer.  An injury.  Pregnancy.  Some medications.  Obesity.  What are the signs or symptoms? Edema is usually painless.Your skin may look swollen or shiny. How is this diagnosed? Your health care provider may be able to diagnose edema by asking about your medical history and doing a physical exam. You may need to have tests such as X-rays, an electrocardiogram, or blood tests to check for medical conditions that may cause edema. How is this treated? Edema treatment depends on the cause. If you have heart, liver, or kidney disease, you need the treatment appropriate for these conditions. General treatment may include:  Elevation of the affected body part above the level of your heart.  Compression of the affected body part. Pressure from elastic bandages or support stockings squeezes the tissues and forces fluid back into the blood vessels. This keeps fluid from entering the tissues.  Restriction of  fluid and salt intake.  Use of a water pill (diuretic). These medications are appropriate only for some types of edema. They pull fluid out of your body and make you urinate more often. This gets rid of fluid and reduces swelling, but diuretics can have side effects. Only use diuretics as directed by your health care provider.  Follow these instructions at home:  Keep the affected body part above the level of your heart when you are lying down.  Do not sit still or stand for prolonged periods.  Do not put anything directly under your knees when lying down.  Do not wear constricting clothing or garters on your upper legs.  Exercise your legs to work the fluid back into your blood vessels. This may help the swelling go down.  Wear elastic bandages or support stockings to reduce ankle swelling as directed by your health care provider.  Eat a low-salt diet to reduce fluid if your health care provider recommends it.  Only take medicines as directed by your health care provider. Contact a health care provider if:  Your edema is not responding to treatment.  You have heart, liver, or kidney disease and notice symptoms of edema.  You have edema in your legs that does not improve after elevating them.  You have sudden and unexplained weight gain. Get help right away if:  You develop shortness of breath or chest pain.  You cannot breathe when you lie down.  You develop pain, redness, or warmth in the swollen areas.  You have  heart, liver, or kidney disease and suddenly get edema.  You have a fever and your symptoms suddenly get worse. This information is not intended to replace advice given to you by your health care provider. Make sure you discuss any questions you have with your health care provider. Document Released: 11/12/2005 Document Revised: 04/19/2016 Document Reviewed: 09/04/2013 Elsevier Interactive Patient Education  2017 [employer]    Fatigue Fatigue is feeling  tired all of the time, a lack of energy, or a lack of motivation. Occasional or mild fatigue is often a normal response to activity or life in general. However, long-lasting (chronic) or extreme fatigue may indicate an underlying medical condition. Follow these instructions at home: Watch your fatigue for any changes. The following actions may help to lessen any discomfort you are feeling:  Talk to your health care provider about how much sleep you need each night. Try to get the required amount every night.  Take medicines only as directed by your health care provider.  Eat a healthy and nutritious diet. Ask your health care provider if you need help changing your diet.  Drink enough fluid to keep your urine clear or pale yellow.  Practice ways of relaxing, such as yoga, meditation, massage therapy, or acupuncture.  Exercise regularly.  Change situations that cause you stress. Try to keep your work and personal routine reasonable.  Do not abuse illegal drugs.  Limit alcohol intake to no more than 1 drink per day for nonpregnant women and 2 drinks per day for men. One drink equals 12 ounces of beer, 5 ounces of wine, or 1 ounces of hard liquor.  Take a multivitamin, if directed by your health care provider.  Contact a health care provider if:  Your fatigue does not get better.  You have a fever.  You have unintentional weight loss or gain.  You have headaches.  You have difficulty: ? Falling asleep. ? Sleeping throughout the night.  You feel angry, guilty, anxious, or sad.  You are unable to have a bowel movement (constipation).  You skin is dry.  Your legs or another part of your body is swollen. Get help right away if:  You feel confused.  Your vision is blurry.  You feel faint or pass out.  You have a severe headache.  You have severe abdominal, pelvic, or back pain.  You have chest pain, shortness of breath, or an irregular or fast heartbeat.  You are  unable to urinate or you urinate less than normal.  You develop abnormal bleeding, such as bleeding from the rectum, vagina, nose, lungs, or nipples.  You vomit blood.  You have thoughts about harming yourself or committing suicide.  You are worried that you might harm someone else. This information is not intended to replace advice given to you by your health care provider. Make sure you discuss any questions you have with your health care provider. Document Released: 09/09/2007 Document Revised: 04/19/2016 Document Reviewed: 03/16/2014 Elsevier Interactive Patient Education  [employer].

## 2018-02-13 ENCOUNTER — Other Ambulatory Visit: Payer: Self-pay | Admitting: Adult Health

## 2018-02-13 DIAGNOSIS — D649 Anemia, unspecified: Secondary | ICD-10-CM

## 2018-02-13 DIAGNOSIS — N179 Acute kidney failure, unspecified: Secondary | ICD-10-CM

## 2018-02-13 LAB — HEPATIC FUNCTION PANEL
AG RATIO: 1.8 (calc) (ref 1.0–2.5)
ALT: 11 U/L (ref 9–46)
AST: 18 U/L (ref 10–35)
Albumin: 4.2 g/dL (ref 3.6–5.1)
Alkaline phosphatase (APISO): 70 U/L (ref 40–115)
Bilirubin, Direct: 0.1 mg/dL (ref 0.0–0.2)
GLOBULIN: 2.3 g/dL (ref 1.9–3.7)
Indirect Bilirubin: 0.4 mg/dL (calc) (ref 0.2–1.2)
TOTAL PROTEIN: 6.5 g/dL (ref 6.1–8.1)
Total Bilirubin: 0.5 mg/dL (ref 0.2–1.2)

## 2018-02-13 LAB — BASIC METABOLIC PANEL WITH GFR
BUN / CREAT RATIO: 11 (calc) (ref 6–22)
BUN: 15 mg/dL (ref 7–25)
CO2: 27 mmol/L (ref 20–32)
CREATININE: 1.37 mg/dL — AB (ref 0.70–1.33)
Calcium: 9.1 mg/dL (ref 8.6–10.3)
Chloride: 104 mmol/L (ref 98–110)
GFR, Est African American: 65 mL/min/{1.73_m2} (ref >=60)
GFR, Est Non African American: 56 mL/min/{1.73_m2} — ABNORMAL LOW (ref >=60)
GLUCOSE: 92 mg/dL (ref 65–99)
Potassium: 3.8 mmol/L (ref 3.5–5.3)
Sodium: 139 mmol/L (ref 135–146)

## 2018-02-13 LAB — CBC WITH DIFFERENTIAL/PLATELET
BASOS PCT: 0.8 %
Basophils Absolute: 48 cells/uL (ref 0–200)
EOS ABS: 138 {cells}/uL (ref 15–500)
Eosinophils Relative: 2.3 %
HEMATOCRIT: 34.6 % — AB (ref 38.5–50.0)
HEMOGLOBIN: 12 g/dL — AB (ref 13.2–17.1)
LYMPHS ABS: 1938 {cells}/uL (ref 850–3900)
MCH: 32.1 pg (ref 27.0–33.0)
MCHC: 34.7 g/dL (ref 32.0–36.0)
MCV: 92.5 fL (ref 80.0–100.0)
MONOS PCT: 11.5 %
MPV: 10.5 fL (ref 7.5–12.5)
NEUTROS ABS: 3186 {cells}/uL (ref 1500–7800)
Neutrophils Relative %: 53.1 %
Platelets: 244 10*3/uL (ref 140–400)
RBC: 3.74 10*6/uL — ABNORMAL LOW (ref 4.20–5.80)
RDW: 13.4 % (ref 11.0–15.0)
Total Lymphocyte: 32.3 %
WBC: 6 10*3/uL (ref 3.8–10.8)
WBCMIX: 690 {cells}/uL (ref 200–950)

## 2018-02-13 LAB — IRON,TIBC AND FERRITIN PANEL
%SAT: 24 % (calc) (ref 15–60)
Ferritin: 36 ng/mL (ref 20–380)
IRON: 73 ug/dL (ref 50–180)
TIBC: 306 mcg/dL (calc) (ref 250–425)

## 2018-02-13 LAB — LIPID PANEL
CHOL/HDL RATIO: 4.9 (calc) (ref <5.0)
Cholesterol: 181 mg/dL (ref <200)
HDL: 37 mg/dL — AB (ref >40)
LDL Cholesterol (Calc): 121 mg/dL (calc) — ABNORMAL HIGH
NON-HDL CHOLESTEROL (CALC): 144 mg/dL — AB (ref <130)
TRIGLYCERIDES: 115 mg/dL (ref <150)

## 2018-02-13 LAB — RETICULOCYTES
ABS Retic: 33120 cells/uL (ref 25000–9000)
RETIC CT PCT: 0.9 %

## 2018-02-13 LAB — TEST AUTHORIZATION

## 2018-02-13 LAB — VITAMIN B12: Vitamin B-12: 348 pg/mL (ref 200–1100)

## 2018-02-13 LAB — VITAMIN D 25 HYDROXY (VIT D DEFICIENCY, FRACTURES): Vit D, 25-Hydroxy: 40 ng/mL (ref 30–100)

## 2018-02-13 LAB — TSH: TSH: 1.6 mIU/L (ref 0.40–4.50)

## 2018-02-14 ENCOUNTER — Telehealth: Payer: Self-pay

## 2018-02-14 NOTE — Telephone Encounter (Signed)
Patient called stating that he has swelling and pain in right leg starting at ankle and going into his shin. Also lower right side of back is hurting and urine smells. Advised patient that due to the office closing at 2 today he is not able to be seen. Advised patient to go to Urgent Care.

## 2018-02-17 NOTE — Progress Notes (Signed)
Assessment and Plan:  Edwin Webster was seen today for back pain and leg pain.  Diagnoses and all orders for this visit:  Acute right flank pain ? R kidney stone, r/o nephrotic syndrome and multiple myeloma due to ?back pain and fatigue  - Dr. Melford Aase consulted -     CBC with Differential/Platelet -     BASIC METABOLIC PANEL WITH GFR -     Urinalysis w microscopic + reflex cultur -     DG Abd 1 View; Future -     Hepatic function panel  Fatigue/ bilateral lower extremity edema  -     Sedimentation rate -     Serum protein electrophoresis with reflex -     ANA -     Anti-Smith antibody -     Cyclic citrul peptide antibody, IgG -     Hepatic function panel -     EKG 12-Lead - sinus brady with rate in 40s; ?progressive since last - if continues consider follow up with holter monitor   Further disposition pending results of labs. Discussed med's effects and SE's.   Over 15 minutes of exam, counseling, chart review, and critical decision making was performed.   Future Appointments  Date Time Provider [place]  02/27/2018  4:15 PM [facility] None  03/04/2018  9:00 AM Kandis Nab, PsyD [facility] None  03/12/2018  8:30 AM [facility] TE[facility] None  03/24/2018 10:00 AM Kandis Nab, PsyD [facility] None  03/26/2018  3:00 PM Star Age, MD [facility] None  04/17/2018  3:00 PM Liane Comber, NP [facility] None    ------------------------------------------------------------------------------------------------------------------   HPI BP 134/76   Pulse (!) 55   Temp (!) 97.5 F (36.4 C)   Ht 6' (1.829 m)   Wt 178 lb 9.6 oz (81 kg)   SpO2 96%   BMI 24.22 kg/m   57 y.o.male presents for right sided back/flank pain, "kidney pain" per patient, started 1 week ago; dully, achy pain that "sticks or shoots" constant, 7/10,  No notable factors that improve or aggravate  - feels like when he had kidney stone, but hasn't had other typical symptoms (nausea,  etc.) He endorses very dark urine (dark yellow/amber) and odor that is ongoing. He denies nausea/vomiting/abdominal pain, denies BM changes. Denies fever but endorses chills (not new, ongoing) without diaphoresis. He endorses new mild edema of bilateral lower legs with tenderness.   + hx renal calculi, gout, Seronegative RA, ongoing unexplained fatigue for over a year, "wake up tired and go to sleep tired" - had sleep study, was reportedly negative  Past Medical History:  Diagnosis Date  . Allergy   . Anemia   . Anxiety   . Arthritis   . Chest pain, non-cardiac   . COPD (chronic obstructive pulmonary disease) ([place])   . Depression   . Diverticulitis   . GERD (gastroesophageal reflux disease)   . Gout   . Hyperlipidemia   . Kidney stones   . Rheumatoid arthritis ([facility])    hands     Allergies  Allergen Reactions  . Humira [Adalimumab] Other (See Comments)    "blacked out", confused    Current Outpatient Medications on File Prior to Visit  Medication Sig  . aspirin EC 81 MG tablet Take 81 mg daily by mouth.  Marland Kitchen atorvastatin (LIPITOR) 80 MG tablet Take 1/2 to 1 tablet as directed for cholesterol  . bisoprolol (ZEBETA) 10 MG tablet TAKE 1/2 TO 1 TABLET DAILY FOR BP  . Cholecalciferol (  VITAMIN D-3) 5000 UNITS TABS Take 10,000 Units by mouth daily.   Marland Kitchen escitalopram (LEXAPRO) 20 MG tablet Take 1 tablet (20 mg total) by mouth daily.  . famotidine (PEPCID) 40 MG tablet Take 1/2-1 tab with ibuprofen as needed.  Marland Kitchen ibuprofen (ADVIL,MOTRIN) 800 MG tablet Take 1 tablet (800 mg total) by mouth daily as needed.  Marland Kitchen levocetirizine (XYZAL) 5 MG tablet TAKE 1 TABLET (5 MG TOTAL) BY MOUTH EVERY EVENING.  . fluticasone (FLONASE) 50 MCG/ACT nasal spray PLACE 2 SPRAYS INTO BOTH NOSTRILS DAILY. (Patient not taking: Reported on 02/18/2018)  . folic acid (FOLVITE) 1 MG tablet Take 1 mg daily by mouth.   No current facility-administered medications on file prior to visit.     Review of Systems   Constitutional: Positive for chills and malaise/fatigue. Negative for diaphoresis, fever and weight loss.  HENT: Negative.   Eyes: Negative for blurred vision and double vision.  Respiratory: Negative for cough, sputum production, shortness of breath and wheezing.   Cardiovascular: Positive for leg swelling. Negative for chest pain, palpitations, orthopnea, claudication and PND.  Gastrointestinal: Negative for abdominal pain, blood in stool, constipation, diarrhea, heartburn, melena, nausea and vomiting.  Genitourinary: Positive for flank pain. Negative for dysuria, frequency, hematuria and urgency.       Urine odor, dark  Musculoskeletal: Positive for back pain and joint pain. Negative for falls, myalgias and neck pain.  Skin: Negative for itching and rash.  Neurological: Negative for dizziness, tingling, sensory change, focal weakness and headaches.     Physical Exam:  BP 134/76   Pulse (!) 55   Temp (!) 97.5 F (36.4 C)   Ht 6' (1.829 m)   Wt 178 lb 9.6 oz (81 kg)   SpO2 96%   BMI 24.22 kg/m   General Appearance: Well nourished, in no acute distress. Eyes: PERRLA, EOMs, conjunctiva no swelling or erythema Sinuses: No Frontal/maxillary tenderness ENT/Mouth: Ext aud canals clear, TMs without erythema, bulging. No erythema, swelling, or exudate on post pharynx.  Tonsils not swollen or erythematous. Hearing normal.  Neck: Supple, thyroid normal.  Respiratory: Respiratory effort normal, BS equal bilaterally without rales, rhonchi, wheezing or stridor.  Cardio: RRR with no MRGs. Brisk peripheral pulses with bilateral lower extremity edema (nonpitting), generalized tenderness to palpation.  Abdomen: Soft, + BS.  Non tender, no guarding, rebound, hernias, masses. Lymphatics: Non tender without lymphadenopathy.  Musculoskeletal: Full ROM, 5/5 strength, normal gait. + R sided CVA tenderness vs bony/muscular tenderness to palpation Skin: Warm, dry without rashes, lesions, ecchymosis.   Neuro: Cranial nerves intact. Normal muscle tone, no cerebellar symptoms. Sensation intact.  Psych: Awake and oriented X 3, normal affect, Insight and Judgment appropriate.     [name], NP 4:13 PM [facility]

## 2018-02-18 ENCOUNTER — Encounter: Payer: Self-pay | Admitting: Adult Health

## 2018-02-18 ENCOUNTER — Ambulatory Visit: Payer: 59 | Admitting: Adult Health

## 2018-02-18 VITALS — BP 134/76 | HR 55 | Temp 97.5°F | Ht 72.0 in | Wt 178.6 lb

## 2018-02-18 DIAGNOSIS — R600 Localized edema: Secondary | ICD-10-CM

## 2018-02-18 DIAGNOSIS — R5383 Other fatigue: Secondary | ICD-10-CM | POA: Diagnosis not present

## 2018-02-18 DIAGNOSIS — R109 Unspecified abdominal pain: Secondary | ICD-10-CM | POA: Diagnosis not present

## 2018-02-19 ENCOUNTER — Ambulatory Visit (HOSPITAL_COMMUNITY)
Admission: RE | Admit: 2018-02-19 | Discharge: 2018-02-19 | Disposition: A | Discharge status: Home / Self Care | Payer: 59 | Source: Ambulatory Visit | Attending: Adult Health | Admitting: Adult Health

## 2018-02-19 DIAGNOSIS — R109 Unspecified abdominal pain: Secondary | ICD-10-CM | POA: Insufficient documentation

## 2018-02-19 LAB — ANTI-SMITH ANTIBODY: ENA SM AB SER-ACNC: NEGATIVE AI

## 2018-02-19 LAB — CYCLIC CITRUL PEPTIDE ANTIBODY, IGG: Cyclic Citrullin Peptide Ab: <16 UNITS

## 2018-02-20 ENCOUNTER — Other Ambulatory Visit: Payer: Self-pay | Admitting: Adult Health

## 2018-02-20 DIAGNOSIS — R769 Abnormal immunological finding in serum, unspecified: Secondary | ICD-10-CM

## 2018-02-21 ENCOUNTER — Other Ambulatory Visit: Payer: Self-pay | Admitting: Adult Health

## 2018-02-21 ENCOUNTER — Encounter: Payer: Self-pay | Admitting: Adult Health

## 2018-02-21 DIAGNOSIS — D472 Monoclonal gammopathy: Secondary | ICD-10-CM

## 2018-02-21 LAB — HEPATIC FUNCTION PANEL
AG Ratio: 2 (calc) (ref 1.0–2.5)
ALKALINE PHOSPHATASE (APISO): 69 U/L (ref 40–115)
ALT: 10 U/L (ref 9–46)
AST: 18 U/L (ref 10–35)
Albumin: 4.4 g/dL (ref 3.6–5.1)
BILIRUBIN TOTAL: 0.5 mg/dL (ref 0.2–1.2)
Bilirubin, Direct: 0.1 mg/dL (ref 0.0–0.2)
Globulin: 2.2 g/dL (calc) (ref 1.9–3.7)
Indirect Bilirubin: 0.4 mg/dL (calc) (ref 0.2–1.2)
Total Protein: 6.6 g/dL (ref 6.1–8.1)

## 2018-02-21 LAB — CBC WITH DIFFERENTIAL/PLATELET
BASOS ABS: 61 {cells}/uL (ref 0–200)
Basophils Relative: 1.1 %
EOS PCT: 2.4 %
Eosinophils Absolute: 132 cells/uL (ref 15–500)
HCT: 34.9 % — ABNORMAL LOW (ref 38.5–50.0)
HEMOGLOBIN: 12.1 g/dL — AB (ref 13.2–17.1)
Lymphs Abs: 1777 cells/uL (ref 850–3900)
MCH: 32.1 pg (ref 27.0–33.0)
MCHC: 34.7 g/dL (ref 32.0–36.0)
MCV: 92.6 fL (ref 80.0–100.0)
MPV: 10.6 fL (ref 7.5–12.5)
Monocytes Relative: 13.4 %
NEUTROS ABS: 2794 {cells}/uL (ref 1500–7800)
Neutrophils Relative %: 50.8 %
PLATELETS: 238 10*3/uL (ref 140–400)
RBC: 3.77 10*6/uL — ABNORMAL LOW (ref 4.20–5.80)
RDW: 13.5 % (ref 11.0–15.0)
TOTAL LYMPHOCYTE: 32.3 %
WBC mixed population: 737 cells/uL (ref 200–950)
WBC: 5.5 10*3/uL (ref 3.8–10.8)

## 2018-02-21 LAB — BASIC METABOLIC PANEL WITH GFR
BUN: 20 mg/dL (ref 7–25)
CHLORIDE: 106 mmol/L (ref 98–110)
CO2: 26 mmol/L (ref 20–32)
CREATININE: 1.29 mg/dL (ref 0.70–1.33)
Calcium: 9 mg/dL (ref 8.6–10.3)
GFR, Est African American: 70 mL/min/{1.73_m2} (ref >=60)
GFR, Est Non African American: 60 mL/min/{1.73_m2} (ref >=60)
Glucose, Bld: 79 mg/dL (ref 65–99)
POTASSIUM: 4.2 mmol/L (ref 3.5–5.3)
SODIUM: 141 mmol/L (ref 135–146)

## 2018-02-21 LAB — PROTEIN ELECTROPHORESIS, SERUM, WITH REFLEX
ABNORMAL PROTEIN BAND1: 0.6 g/dL — AB
Albumin ELP: 4.3 g/dL (ref 3.8–4.8)
Alpha 1: 0.3 g/dL (ref 0.2–0.3)
Alpha 2: 0.6 g/dL (ref 0.5–0.9)
Beta 2: 0.2 g/dL (ref 0.2–0.5)
Beta Globulin: 0.4 g/dL (ref 0.4–0.6)
Gamma Globulin: 0.9 g/dL (ref 0.8–1.7)
Total Protein: 6.7 g/dL (ref 6.1–8.1)

## 2018-02-21 LAB — URINALYSIS W MICROSCOPIC + REFLEX CULTURE
BACTERIA UA: NONE SEEN /HPF
BILIRUBIN URINE: NEGATIVE
Glucose, UA: NEGATIVE
HGB URINE DIPSTICK: NEGATIVE
Hyaline Cast: NONE SEEN /LPF
LEUKOCYTE ESTERASE: NEGATIVE
NITRITES URINE, INITIAL: NEGATIVE
PH: 5.5 (ref 5.0–8.0)
Protein, ur: NEGATIVE
SPECIFIC GRAVITY, URINE: 1.032 (ref 1.001–1.03)
SQUAMOUS EPITHELIAL / LPF: NONE SEEN /HPF (ref <=5)
WBC UA: NONE SEEN /HPF (ref 0–5)

## 2018-02-21 LAB — NO CULTURE INDICATED

## 2018-02-21 LAB — IFE INTERPRETATION: Immunofix Electr Int: DETECTED

## 2018-02-21 LAB — ANA: ANA: NEGATIVE

## 2018-02-21 LAB — SEDIMENTATION RATE: SED RATE: 25 mm/h — AB (ref 0–20)

## 2018-02-21 NOTE — Progress Notes (Signed)
Abnormal protein bands of 0.6 g/dl noted on serum protein electrophoresis with IgM kappa monoclonal proteins detected. Follow up labs pending, will proceed with hematology referral to work up and rule out multiple myeloma.

## 2018-02-24 ENCOUNTER — Encounter: Payer: Self-pay | Admitting: Adult Health

## 2018-02-24 ENCOUNTER — Ambulatory Visit: Payer: 59 | Admitting: Adult Health

## 2018-02-24 ENCOUNTER — Encounter (INDEPENDENT_AMBULATORY_CARE_PROVIDER_SITE_OTHER): Payer: Self-pay

## 2018-02-24 VITALS — BP 156/92 | HR 59 | Temp 97.9°F | Ht 72.0 in | Wt 178.0 lb

## 2018-02-24 DIAGNOSIS — N179 Acute kidney failure, unspecified: Secondary | ICD-10-CM | POA: Diagnosis not present

## 2018-02-24 DIAGNOSIS — R769 Abnormal immunological finding in serum, unspecified: Secondary | ICD-10-CM | POA: Diagnosis not present

## 2018-02-24 DIAGNOSIS — D649 Anemia, unspecified: Secondary | ICD-10-CM | POA: Diagnosis not present

## 2018-02-24 DIAGNOSIS — I10 Essential (primary) hypertension: Secondary | ICD-10-CM

## 2018-02-24 DIAGNOSIS — Z79899 Other long term (current) drug therapy: Secondary | ICD-10-CM

## 2018-02-24 DIAGNOSIS — B029 Zoster without complications: Secondary | ICD-10-CM

## 2018-02-24 MED ORDER — PREDNISONE 20 MG PO TABS: ORAL_TABLET | ORAL | 0 refills | Status: AC

## 2018-02-24 MED ORDER — VALACYCLOVIR HCL 1 G PO TABS: 1000.0000 mg | ORAL_TABLET | Freq: Three times a day (TID) | ORAL | 0 refills | Status: DC

## 2018-02-24 NOTE — Progress Notes (Signed)
Assessment and Plan:  Edwin Webster was seen today for herpes zoster and lab follow up -   Diagnoses and all orders for this visit:  Herpes zoster without complication Herpes Zoster- lyrica 50 mg TID x 1 month worth of samples provided with information. The patient was informed that the vesicles can cause chicken pox in a person that is not immune, will follow up PRN.  -     predniSONE (DELTASONE) 20 MG tablet; 3 tablets daily with food for 3 days, 2 tabs daily for 3 days, 1 tab a day for 5 days. -     valACYclovir (VALTREX) 1000 MG tablet; Take 1 tablet (1,000 mg total) by mouth 3 (three) times daily. Take for 7 days  Abnormal immunoelectrophoresis Pending referral to be seen by hematology after + IgM kappa monoclonal proteins detected for workup of unexplained severe fatigue + ?acute renal function decline. Follow up labs obtained today - -     Immunofixation interpretive, urine -     IgG, IgA, IgM -     Kappa/lambda light chains  Anemia, unspecified type -     CBC with Differential/Platelet  Med management -     BASIC METABOLIC PANEL WITH GFR  Essential hypertension Elevation likely related to shingles pain; advised to increase to full tab of bisoprolol daily and monitor closely, may resume previous 1/2 tab if BP is consistently low below 100/60 Monitor blood pressure at home; call if consistently over 130/80 Continue DASH diet.   Reminder to go to the ER if any CP, SOB, nausea, dizziness, severe HA, changes vision/speech, left arm numbness and tingling and jaw pain.   Further disposition pending results of labs. Discussed med's effects and SE's.   Over 30 minutes of exam, counseling, chart review, and critical decision making was performed.   Future Appointments  Date Time Provider [place]  02/27/2018  4:15 PM [facility] None  03/04/2018  9:00 AM Kandis Nab, PsyD [facility] None  03/12/2018  8:30 AM [facility]- NEUROPSYCH TECH [facility] None  03/24/2018 10:00 AM  Kandis Nab, PsyD [facility] None  03/26/2018  3:00 PM Star Age, MD [facility] None  04/17/2018  3:00 PM Liane Comber, NP [facility] None    ------------------------------------------------------------------------------------------------------------------   HPI BP (!) 156/92   Pulse (!) 59   Temp 97.9 F (36.6 C)   Ht 6' (1.829 m)   Wt 178 lb (80.7 kg)   SpO2 96%   BMI 24.14 kg/m   57 y.o.male presents for evaluation of a rash suspected to be shingles that surfaced on right side of abdomen with prodrome of burning/tenderness x 1 week; he reports pain began with burning/sharp pain R lateral to spine, and over this past week has shifted around his flank towards abdomen. He now presents with vesicular rash over erythematous base in a linear presentation to the right of his umbilicus. He reports area is very tender and burns, somewhat itchy as well.   He is also due here for follow up labs after a workup for ongoing fatigue + acute renal function decline and ?back pain - workup included CBC unremarkable other than mild microcytic/hypochromic anemia stable from previous, negative anti-CCP, anti-smith, ANA, mildly elevated ESR at 25, urine unremarkable other than trace ketones. Some renal function decline noted over recent few visits (see attached labs below). However, serum protein electrophoresis demonstrated abnormal protein bands at 0.6 g/dl with interpretation showing positive IgM kappa monoclonal protein. Hematology referral has been placed and pending - today he is  following up for kappa/lambda light chains, IgG, IgA, IgM quant and urine immunofixation with interpretation.   Component     Latest Ref Rng & Units 10/24/2017 02/12/2018 02/18/2018  BUN     7 - 25 mg/dL '13 15 20  ' Creatinine     0.70 - 1.33 mg/dL 1.11 1.37 (H) 1.29  GFR, Est Non African American     > OR = 60 mL/min/1.30m 73 56 (L) 60  GFR, Est African American     > OR = 60 mL/min/1.734m84 65 70    Past  Medical History:  Diagnosis Date  . Allergy   . Anemia   . Anxiety   . Arthritis   . Chest pain, non-cardiac   . [place]  . Depression   . Diverticulitis   . GERD (gastroesophageal reflux disease)   . Gout   . Hyperlipidemia   . Kidney stones   . Rheumatoid arthritis ([facility])    hands     Allergies  Allergen Reactions  . Humira [Adalimumab] Other (See Comments)    "blacked out", confused    Current Outpatient Medications on File Prior to Visit  Medication Sig  . aspirin EC 81 MG tablet Take 81 mg daily by mouth.  . Marland Kitchentorvastatin (LIPITOR) 80 MG tablet Take 1/2 to 1 tablet as directed for cholesterol  . bisoprolol (ZEBETA) 10 MG tablet TAKE 1/2 TO 1 TABLET DAILY FOR BP  . Cholecalciferol (VITAMIN D-3) 5000 UNITS TABS Take 10,000 Units by mouth daily.   . Marland Kitchenscitalopram (LEXAPRO) 20 MG tablet Take 1 tablet (20 mg total) by mouth daily.  . famotidine (PEPCID) 40 MG tablet Take 1/2-1 tab with ibuprofen as needed.  . fluticasone (FLONASE) 50 MCG/ACT nasal spray PLACE 2 SPRAYS INTO BOTH NOSTRILS DAILY.  . folic acid (FOLVITE) 1 MG tablet Take 1 mg daily by mouth.  . Marland Kitchenbuprofen (ADVIL,MOTRIN) 800 MG tablet Take 1 tablet (800 mg total) by mouth daily as needed.  . Marland Kitchenevocetirizine (XYZAL) 5 MG tablet TAKE 1 TABLET (5 MG TOTAL) BY MOUTH EVERY EVENING.   No current facility-administered medications on file prior to visit.     ROS: all negative except above.   Physical Exam:  BP (!) 156/92   Pulse (!) 59   Temp 97.9 F (36.6 C)   Ht 6' (1.829 m)   Wt 178 lb (80.7 kg)   SpO2 96%   BMI 24.14 kg/m   General Appearance: Well nourished, in no apparent distress. Eyes: PERRLA, EOMs, conjunctiva no swelling or erythema Neck: Supple.  Respiratory: Respiratory effort normal, BS equal bilaterally without rales, rhonchi, wheezing or stridor.  Cardio: RRR with no MRGs. Brisk peripheral pulses without edema.  Musculoskeletal: normal gait.  Skin:  Vesicular rash with erythematous base noted in lineal presentation just right to umbilicus.  Psych: Awake and oriented X 3, normal affect, Insight and Judgment appropriate.    AsIzora RibasNP 5:11 PM [facility]

## 2018-02-24 NOTE — Patient Instructions (Addendum)
Shingles Shingles, which is also known as herpes zoster, is an infection that causes a painful skin rash and fluid-filled blisters. Shingles is not related to genital herpes, which is a sexually transmitted infection. Shingles only develops in people who:  Have had chickenpox.  Have received the chickenpox vaccine. (This is rare.)  What are the causes? Shingles is caused by varicella-zoster virus (VZV). This is the same virus that causes chickenpox. After exposure to VZV, the virus stays in the body in an inactive (dormant) state. Shingles develops if the virus reactivates. This can happen many years after the initial exposure to VZV. It is not known what causes this virus to reactivate. What increases the risk? People who have had chickenpox or received the chickenpox vaccine are at risk for shingles. Infection is more common in people who:  Are older than age 50.  Have a weakened defense (immune) system, such as those with HIV, AIDS, or cancer.  Are taking medicines that weaken the immune system, such as transplant medicines.  Are under great stress.  What are the signs or symptoms? Early symptoms of this condition include itching, tingling, and pain in an area on your skin. Pain may be described as burning, stabbing, or throbbing. A few days or weeks after symptoms start, a painful red rash appears, usually on one side of the body in a bandlike or beltlike pattern. The rash eventually turns into fluid-filled blisters that break open, scab over, and dry up in about 2-3 weeks. At any time during the infection, you may also develop:  A fever.  Chills.  A headache.  An upset stomach.  How is this diagnosed? This condition is diagnosed with a skin exam. Sometimes, skin or fluid samples are taken from the blisters before a diagnosis is made. These samples are examined under a microscope or sent to a lab for testing. How is this treated? There is no specific cure for this condition.  Your health care provider will probably prescribe medicines to help you manage pain, recover more quickly, and avoid long-term problems. Medicines may include:  Antiviral drugs.  Anti-inflammatory drugs.  Pain medicines.  If the area involved is on your face, you may be referred to a specialist, such as an eye doctor (ophthalmologist) or an ear, nose, and throat (ENT) doctor to help you avoid eye problems, chronic pain, or disability. Follow these instructions at home: Medicines  Take medicines only as directed by your health care provider.  Apply an anti-itch or numbing cream to the affected area as directed by your health care provider. Blister and Rash Care  Take a cool bath or apply cool compresses to the area of the rash or blisters as directed by your health care provider. This may help with pain and itching.  Keep your rash covered with a loose bandage (dressing). Wear loose-fitting clothing to help ease the pain of material rubbing against the rash.  Keep your rash and blisters clean with mild soap and cool water or as directed by your health care provider.  Check your rash every day for signs of infection. These include redness, swelling, and pain that lasts or increases.  Do not pick your blisters.  Do not scratch your rash. General instructions  Rest as directed by your health care provider.  Keep all follow-up visits as directed by your health care provider. This is important.  Until your blisters scab over, your infection can cause chickenpox in people who have never had it or been vaccinated   against it. To prevent this from happening, avoid contact with other people, especially: ? Babies. ? Pregnant women. ? Children who have eczema. ? Elderly people who have transplants. ? People who have chronic illnesses, such as leukemia or AIDS. Contact a health care provider if:  Your pain is not relieved with prescribed medicines.  Your pain does not get better after  the rash heals.  Your rash looks infected. Signs of infection include redness, swelling, and pain that lasts or increases. Get help right away if:  The rash is on your face or nose.  You have facial pain, pain around your eye area, or loss of feeling on one side of your face.  You have ear pain or you have ringing in your ear.  You have loss of taste.  Your condition gets worse. This information is not intended to replace advice given to you by your health care provider. Make sure you discuss any questions you have with your health care provider. Document Released: 11/12/2005 Document Revised: 07/08/2016 Document Reviewed: 09/23/2014 Elsevier Interactive Patient Education  2018 [employer].   Valacyclovir caplets What is this medicine? VALACYCLOVIR (val ay SYE kloe veer) is an antiviral medicine. It is used to treat or prevent infections caused by certain kinds of viruses. Examples of these infections include herpes and shingles. This medicine will not cure herpes. This medicine may be used for other purposes; ask your health care provider or pharmacist if you have questions. COMMON BRAND NAME(S): Valtrex What should I tell my health care provider before I take this medicine? They need to know if you have any of these conditions: -acquired immunodeficiency syndrome (AIDS) -any other condition that may weaken the immune system -bone marrow or kidney transplant -kidney disease -an unusual or allergic reaction to valacyclovir, acyclovir, ganciclovir, valganciclovir, other medicines, foods, dyes, or preservatives -pregnant or trying to get pregnant -breast-feeding How should I use this medicine? Take this medicine by mouth with a glass of water. Follow the directions on the prescription label. You can take this medicine with or without food. Take your doses at regular intervals. Do not take your medicine more often than directed. Finish the full course prescribed by your doctor or  health care professional even if you think your condition is better. Do not stop taking except on the advice of your doctor or health care professional. Talk to your pediatrician regarding the use of this medicine in children. While this drug may be prescribed for children as young as 2 years for selected conditions, precautions do apply. Overdosage: If you think you have taken too much of this medicine contact a poison control center or emergency room at once. NOTE: This medicine is only for you. Do not share this medicine with others. What if I miss a dose? If you miss a dose, take it as soon as you can. If it is almost time for your next dose, take only that dose. Do not take double or extra doses. What may interact with this medicine? -cimetidine -probenecid This list may not describe all possible interactions. Give your health care provider a list of all the medicines, herbs, non-prescription drugs, or dietary supplements you use. Also tell them if you smoke, drink alcohol, or use illegal drugs. Some items may interact with your medicine. What should I watch for while using this medicine? Tell your doctor or health care professional if your symptoms do not start to get better after 1 week. This medicine works best when taken early  in the course of an infection, within the first 72 hours. Begin treatment as soon as possible after the first signs of infection like tingling, itching, or pain in the affected area. It is possible that genital herpes may still be spread even when you are not having symptoms. Always use safer sex practices like condoms made of latex or polyurethane whenever you have sexual contact. You should stay well hydrated while taking this medicine. Drink plenty of fluids. What side effects may I notice from receiving this medicine? Side effects that you should report to your doctor or health care professional as soon as possible: -allergic reactions like skin rash, itching or  hives, swelling of the face, lips, or tongue -aggressive behavior -confusion -hallucinations -problems with balance, talking, walking -stomach pain -tremor -trouble passing urine or change in the amount of urine Side effects that usually do not require medical attention (report to your doctor or health care professional if they continue or are bothersome): -dizziness -headache -nausea, vomiting This list may not describe all possible side effects. Call your doctor for medical advice about side effects. You may report side effects to FDA at 1-800-FDA-1088. Where should I keep my medicine? Keep out of the reach of children. Store at room temperature between 15 and 25 degrees C (59 and 77 degrees F). Keep container tightly closed. Throw away any unused medicine after the expiration date. NOTE: This sheet is a summary. It may not cover all possible information. If you have questions about this medicine, talk to your doctor, pharmacist, or health care provider.  2018 Elsevier/Gold Standard (2012-10-28 16:34:05)     Can take the lyrica samples for nerve pain. It can make you sleepy so we suggest trying it at night first and please plan to not drive or do anything strenuous. Also please do not take this medication with alcohol.  Start out 1 pill at night before bed, can increase to 2 pills at night before bed. Please call the office if you have any side effects.   Can take 3 pills a day however you would like  Some examples: - 1 breakfast, lunch, bedtime. - 1 at breakfast, 2 at bed time  Lyrica (generic name: pregabalin) 50 mg strength: Take 1 pill each bedtime for one week, then one pill twice daily for one week, then one pill 3 times a day thereafter. Common side effects are sleepiness, concentration problems, dizziness, swelling. Angioedema (e.g. swelling of the throat, head and neck) can occur, and may be associated with life-threatening respiratory compromise requiring emergency  treatment. Discontinue LYRICA immediately in that case. Hypersensitivity reactions (e.g. hives, dyspnea, and wheezing) can occur. Discontinue LYRICA immediately in that case. Increased seizure frequency may occur in patients with seizure disorders if LYRICA is discontinued rapidly. Do not stop abruptly unless you have a reaction to it. Antiepileptic drugs, including LYRICA, increase the risk of suicidal thoughts or behavior.   How should I use this medicine? Take this medicine by mouth with a glass of water. Follow the directions on the prescription label. You can take this medicine with or without food. Take your doses at regular intervals. Do not take your medicine more often than directed. Do not stop taking except on your doctor's advice.  What if I miss a dose? If you miss a dose, take it as soon as you can. If it is almost time for your next dose, take only that dose. Do not take double or extra doses.  What should I watch for  while using this medicine? Tell your doctor or healthcare professional if your symptoms do not start to get better or if they get worse.   You may get drowsy or dizzy. Do not drive, use machinery, or do anything that needs mental alertness until you know how this medicine affects you. Do not stand or sit up quickly, especially if you are an older patient. This reduces the risk of dizzy or fainting spells. Alcohol may interfere with the effect of this medicine. Avoid alcoholic drinks. If you have a heart condition, like congestive heart failure, and notice that you are retaining water and have swelling in your hands or feet, contact your health care provider immediately.  What side effects may I notice from receiving this medicine? Side effects that you should report to your doctor or health care professional as soon as possible and are very rare: -allergic reactions like skin rash, itching or hives, swelling of the face, lips, or tongue -breathing problems -changes in  vision -jerking or unusual movements of any part of your body -suicidal thoughts or other mood changes -swelling of the ankles, feet, hands -unusual bruising or bleeding  Side effects that usually do not require medical attention (Report these to your doctor or health care professional if they continue or are bothersome.): -dizziness -drowsiness -dry mouth -nausea -tremors

## 2018-02-25 ENCOUNTER — Other Ambulatory Visit: Payer: Self-pay | Admitting: Adult Health

## 2018-02-25 LAB — BASIC METABOLIC PANEL WITH GFR
BUN: 15 mg/dL (ref 7–25)
CALCIUM: 9.5 mg/dL (ref 8.6–10.3)
CO2: 29 mmol/L (ref 20–32)
CREATININE: 1.33 mg/dL (ref 0.70–1.33)
Chloride: 109 mmol/L (ref 98–110)
GFR, EST AFRICAN AMERICAN: 67 mL/min/{1.73_m2} (ref >=60)
GFR, EST NON AFRICAN AMERICAN: 58 mL/min/{1.73_m2} — AB (ref >=60)
Glucose, Bld: 84 mg/dL (ref 65–99)
POTASSIUM: 4.7 mmol/L (ref 3.5–5.3)
Sodium: 145 mmol/L (ref 135–146)

## 2018-02-25 LAB — CBC WITH DIFFERENTIAL/PLATELET
BASOS PCT: 0.9 %
Basophils Absolute: 49 cells/uL (ref 0–200)
Eosinophils Absolute: 119 cells/uL (ref 15–500)
Eosinophils Relative: 2.2 %
HEMATOCRIT: 35.1 % — AB (ref 38.5–50.0)
Hemoglobin: 12.3 g/dL — ABNORMAL LOW (ref 13.2–17.1)
Lymphs Abs: 1517 cells/uL (ref 850–3900)
MCH: 32.3 pg (ref 27.0–33.0)
MCHC: 35 g/dL (ref 32.0–36.0)
MCV: 92.1 fL (ref 80.0–100.0)
MPV: 10.5 fL (ref 7.5–12.5)
Monocytes Relative: 16 %
NEUTROS ABS: 2851 {cells}/uL (ref 1500–7800)
Neutrophils Relative %: 52.8 %
PLATELETS: 246 10*3/uL (ref 140–400)
RBC: 3.81 10*6/uL — AB (ref 4.20–5.80)
RDW: 13.5 % (ref 11.0–15.0)
Total Lymphocyte: 28.1 %
WBC: 5.4 10*3/uL (ref 3.8–10.8)
WBCMIX: 864 {cells}/uL (ref 200–950)

## 2018-02-26 LAB — KAPPA/LAMBDA LIGHT CHAINS
KAPPA FREE LGHT CHN: 102 mg/L — AB (ref 3.3–19.4)
KAPPA LAMBDA RATIO: 6.94 — AB (ref 0.26–1.65)
LAMBDA FREE LGHT CHN: 14.7 mg/L (ref 5.7–26.3)

## 2018-02-26 LAB — IGG, IGA, IGM
IGG (IMMUNOGLOBIN G), SERUM: 425 mg/dL — AB (ref 694–1618)
IgM, Serum: 904 mg/dL — ABNORMAL HIGH (ref 48–271)
Immunoglobulin A: 33 mg/dL — ABNORMAL LOW (ref 81–463)

## 2018-02-26 LAB — IMMUNOFIXATION INTE

## 2018-02-27 ENCOUNTER — Other Ambulatory Visit: Payer: Self-pay

## 2018-03-04 ENCOUNTER — Encounter: Payer: 59 | Admitting: Psychology

## 2018-03-12 ENCOUNTER — Inpatient Hospital Stay: Payer: 59 | Attending: Hematology & Oncology | Admitting: Hematology & Oncology

## 2018-03-12 ENCOUNTER — Ambulatory Visit (HOSPITAL_BASED_OUTPATIENT_CLINIC_OR_DEPARTMENT_OTHER)
Admission: RE | Admit: 2018-03-12 | Discharge: 2018-03-12 | Disposition: A | Discharge status: Home / Self Care | Payer: 59 | Source: Ambulatory Visit | Attending: Hematology & Oncology | Admitting: Hematology & Oncology

## 2018-03-12 ENCOUNTER — Encounter: Payer: Self-pay | Admitting: Hematology & Oncology

## 2018-03-12 ENCOUNTER — Encounter (HOSPITAL_BASED_OUTPATIENT_CLINIC_OR_DEPARTMENT_OTHER): Payer: Self-pay

## 2018-03-12 ENCOUNTER — Inpatient Hospital Stay: Payer: 59

## 2018-03-12 ENCOUNTER — Ambulatory Visit (HOSPITAL_BASED_OUTPATIENT_CLINIC_OR_DEPARTMENT_OTHER): Payer: 59

## 2018-03-12 ENCOUNTER — Other Ambulatory Visit: Payer: Self-pay

## 2018-03-12 VITALS — BP 127/72 | HR 57 | Temp 97.7°F | Resp 20 | Wt 171.0 lb

## 2018-03-12 DIAGNOSIS — G9331 Postviral fatigue syndrome: Secondary | ICD-10-CM

## 2018-03-12 DIAGNOSIS — R768 Other specified abnormal immunological findings in serum: Secondary | ICD-10-CM | POA: Insufficient documentation

## 2018-03-12 DIAGNOSIS — D500 Iron deficiency anemia secondary to blood loss (chronic): Secondary | ICD-10-CM

## 2018-03-12 DIAGNOSIS — R590 Localized enlarged lymph nodes: Secondary | ICD-10-CM | POA: Diagnosis not present

## 2018-03-12 DIAGNOSIS — I700 Atherosclerosis of aorta: Secondary | ICD-10-CM | POA: Diagnosis not present

## 2018-03-12 DIAGNOSIS — G933 Postviral fatigue syndrome: Secondary | ICD-10-CM

## 2018-03-12 DIAGNOSIS — D472 Monoclonal gammopathy: Secondary | ICD-10-CM

## 2018-03-12 DIAGNOSIS — I2510 Atherosclerotic heart disease of native coronary artery without angina pectoris: Secondary | ICD-10-CM | POA: Insufficient documentation

## 2018-03-12 DIAGNOSIS — D508 Other iron deficiency anemias: Secondary | ICD-10-CM

## 2018-03-12 DIAGNOSIS — D649 Anemia, unspecified: Secondary | ICD-10-CM | POA: Insufficient documentation

## 2018-03-12 LAB — CBC WITH DIFFERENTIAL (CANCER CENTER ONLY)
Basophils Absolute: 0 10*3/uL (ref 0.0–0.1)
Basophils Relative: 1 %
EOS ABS: 0.1 10*3/uL (ref 0.0–0.5)
Eosinophils Relative: 2 %
HCT: 38.7 % (ref 38.7–49.9)
HEMOGLOBIN: 13.2 g/dL (ref 13.0–17.1)
LYMPHS ABS: 1.9 10*3/uL (ref 0.9–3.3)
LYMPHS PCT: 22 %
MCH: 32.8 pg (ref 28.0–33.4)
MCHC: 34.1 g/dL (ref 32.0–35.9)
MCV: 96 fL (ref 82.0–98.0)
Monocytes Absolute: 1.1 10*3/uL — ABNORMAL HIGH (ref 0.1–0.9)
Monocytes Relative: 12 %
NEUTROS PCT: 63 %
Neutro Abs: 5.4 10*3/uL (ref 1.5–6.5)
Platelet Count: 253 10*3/uL (ref 145–400)
RBC: 4.03 MIL/uL — AB (ref 4.20–5.70)
RDW: 14.2 % (ref 11.1–15.7)
WBC: 8.5 10*3/uL (ref 4.0–10.0)

## 2018-03-12 LAB — CMP (CANCER CENTER ONLY)
ALK PHOS: 70 U/L (ref 26–84)
ALT: 30 U/L (ref 10–47)
AST: 22 U/L (ref 11–38)
Albumin: 3.9 g/dL (ref 3.5–5.0)
Anion gap: 8 (ref 5–15)
BILIRUBIN TOTAL: 0.8 mg/dL (ref 0.2–1.6)
BUN: 19 mg/dL (ref 7–22)
CHLORIDE: 105 mmol/L (ref 98–108)
CO2: 30 mmol/L (ref 18–33)
CREATININE: 1 mg/dL (ref 0.60–1.20)
Calcium: 9.8 mg/dL (ref 8.0–10.3)
Glucose, Bld: 96 mg/dL (ref 73–118)
POTASSIUM: 3.5 mmol/L (ref 3.3–4.7)
Sodium: 143 mmol/L (ref 128–145)
Total Protein: 7.4 g/dL (ref 6.4–8.1)

## 2018-03-12 LAB — TECHNOLOGIST SMEAR REVIEW

## 2018-03-12 MED ORDER — IOPAMIDOL (ISOVUE-300) INJECTION 61%
100.0000 mL | Freq: Once | INTRAVENOUS | Status: AC | PRN
  Administered 2018-03-12: 100 mL via INTRAVENOUS

## 2018-03-12 NOTE — Progress Notes (Signed)
Referral MD  Reason for Referral: Elevated IgM level  Chief Complaint  Patient presents with  . New Patient (Initial Visit)  : I just feel tired.  HPI: Mr. Attar is a very nice 57 year old white male.  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  He stopped smoking 20 years ago.  He used to drink beer quite heavily but has not had a drink for several years.  He is begun to feel fatigued.  He just does not have a lot of energy.  He has lost about 9 pounds in the past couple weeks.  He lives down in [place].  He went to see his family doctor.  Of note he did have shingles.  This was a localized patch in the right T11 dermatome.  He was placed on Valtrex and prednisone.  He was found to have an elevated IgM level of 904 mg/dL.  This was drawn on April 2.  His IgA and IgG levels were quite low.  He had an SPEP done.  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  Light chains were done.  His Kappa Lightchain was 10.2 mg/dL.  This was elevated.  He has normal electrolytes.  He was mildly anemic.  No other x-rays have been done so far.  He just feels tired.  Has not noted any palpable lymph nodes.  He has had no rashes.  Is been no leg swelling.  He has had no change in medications.  He has had no headaches.  He has had no mouth sores.  Overall, his performance status is ECOG 1.  There is a history of colon cancer and breast cancer in the family.  He said a maternal grandmother had "bone cancer."     Past Medical History:  Diagnosis Date  . Allergy   . Anemia   . Anxiety   . Arthritis   . Chest pain, non-cardiac   . COPD (chronic obstructive pulmonary disease) ([place])   . Depression   . Diverticulitis   . GERD (gastroesophageal reflux disease)   . Gout   . Hyperlipidemia   . Kidney stones   . Rheumatoid arthritis ([place])    hands  :  Past Surgical History:  Procedure Laterality Date  . COLONOSCOPY    . FINGER SURGERY    . KNEE SURGERY     right  . Alex   left  :   Current Outpatient Medications:  .  aspirin EC 81 MG tablet, Take 81 mg daily by mouth., Disp: , Rfl:  .  bisoprolol (ZEBETA) 10 MG tablet, TAKE 1/2 TO 1 TABLET DAILY FOR BP (Patient taking differently: TAKE 1/2  tab daily 03/12/18), Disp: 90 tablet, Rfl: 1 .  Cholecalciferol (VITAMIN D-3) 5000 UNITS TABS, Take 10,000 Units by mouth daily. , Disp: , Rfl:  .  escitalopram (LEXAPRO) 20 MG tablet, Take 1 tablet (20 mg total) by mouth daily., Disp: 90 tablet, Rfl: 1 .  famotidine (PEPCID) 40 MG tablet, Take 1/2-1 tab with ibuprofen as needed., Disp: 90 tablet, Rfl: 1 .  fluticasone (FLONASE) 50 MCG/ACT nasal spray, PLACE 2 SPRAYS INTO BOTH NOSTRILS DAILY. (Patient taking differently: PLACE 2 SPRAYS INTO BOTH NOSTRILS DAILY.  Takes prn.), Disp: 16 g, Rfl: 2 .  folic acid (FOLVITE) 1 MG tablet, Take 1 mg daily by mouth., Disp: , Rfl:  .  ibuprofen (ADVIL,MOTRIN) 800 MG tablet, Take 1 tablet (800 mg total) by mouth daily as needed., Disp:  90 tablet, Rfl: 1 .  levocetirizine (XYZAL) 5 MG tablet, TAKE 1 TABLET (5 MG TOTAL) BY MOUTH EVERY EVENING., Disp: 90 tablet, Rfl: 3 .  atorvastatin (LIPITOR) 80 MG tablet, Take 1/2 to 1 tablet as directed for cholesterol, Disp: 90 tablet, Rfl: 3 .  valACYclovir (VALTREX) 1000 MG tablet, Take 1 tablet (1,000 mg total) by mouth 3 (three) times daily. Take for 7 days, Disp: 21 tablet, Rfl: 0:  :  Allergies  Allergen Reactions  . Humira [Adalimumab] Other (See Comments)    "blacked out", confused  :  Family History  Problem Relation Age of Onset  . Hyperlipidemia Mother   . COPD Mother   . Heart failure Mother   . Cancer Mother        breast  . Hyperlipidemia Brother   . Stroke Maternal Grandfather   . COPD Maternal Grandfather        colon  . Colon cancer Maternal Grandfather 26       died at 10  . Esophageal cancer Neg Hx   . Stomach cancer Neg Hx   . Rectal cancer Neg Hx   . Hypertension Father   :  Social History    Socioeconomic History  . Marital status: Married    Spouse name: Not on file  . Number of children: Not on file  . Years of education: Not on file  . Highest education level: Not on file  Occupational History  . Not on file  Social Needs  . Financial resource strain: Not on file  . Food insecurity:    Worry: Not on file    Inability: Not on file  . Transportation needs:    Medical: Not on file    Non-medical: Not on file  Tobacco Use  . Smoking status: Former Smoker    Packs/day: 2.00    Types: Cigarettes    Last attempt to quit: 07/03/2010    Years since quitting: 7.6  . Smokeless tobacco: Never Used  Substance and Sexual Activity  . Alcohol use: No    Alcohol/week: 0.0 oz  . Drug use: No  . Sexual activity: Not on file  Lifestyle  . Physical activity:    Days per week: Not on file    Minutes per session: Not on file  . Stress: Not on file  Relationships  . Social connections:    Talks on phone: Not on file    Gets together: Not on file    Attends religious service: Not on file    Active member of club or organization: Not on file    Attends meetings of clubs or organizations: Not on file    Relationship status: Not on file  . Intimate partner violence:    Fear of current or ex partner: Not on file    Emotionally abused: Not on file    Physically abused: Not on file    Forced sexual activity: Not on file  Other Topics Concern  . Not on file  Social History Narrative  . Not on file  :  Review of Systems  Constitutional: Positive for malaise/fatigue.  HENT: Negative.   Eyes: Negative.   Respiratory: Positive for shortness of breath.   Cardiovascular: Negative.   Gastrointestinal: Negative.   Genitourinary: Positive for urgency.  Musculoskeletal: Positive for joint pain and myalgias.  Skin: Positive for rash.  Neurological: Positive for tingling.  Endo/Heme/Allergies: Negative.   Psychiatric/Behavioral: Negative.      Exam: Well-developed and  well-nourished white  male in no obvious distress.  Vital signs show temperature of 97.7.  Pulse 57.  Blood pressure 127/72.  Weight is 171 pounds.  Head neck exam shows no ocular or oral lesions.  He has no palpable cervical or supraclavicular lymph nodes.  Lungs are clear laterally.  Cardiac exam regular rate and rhythm with no murmurs, rubs or bruits.  Axillary exam does show a mobile 1 cm lymph node in the left axilla.  There is no right axillary adenopathy.  Abdomen is soft.  He has good bowel sounds.  There is no fluid wave.  He does have a healing shingles lesion in the right T11 dermatome.  There is no palpable liver or spleen tip.  Back exam shows possible scoliosis.  There is some slight tenderness in the right flank.  Extremities shows no clubbing, cyanosis or edema.  He has good range of motion of his joints.  Skin exam shows no rashes, ecchymoses or petechia, outside of the shingles lesions.  Neurological exam is nonfocal. _0 @   Recent Labs    03/12/18 1439  WBC 8.5  HCT 38.7  PLT 253   Recent Labs    03/12/18 1439  NA 143  K 3.5  CL 105  CO2 30  GLUCOSE 96  BUN 19  CREATININE 1.00  CALCIUM 9.8    Blood smear review: None  Pathology: None    Assessment and Plan: Mr. Dorrance is a 57 year old white male.  He has a IgM kappa protein.  I have to believe that he has some type of underlying hematologic malignancy.  I think the fact that he had shingles is no coincidence.  His IgG and IgA levels are quite low.  This would definitely affect his immune system.  I can feel the lymph node in the left axilla.  We will set him up with a CT scan of the chest/abdomen/pelvis today.  I have to believe that this will show [place] something.  If, for some reason, we cannot find anything on his scans, then I think a bone marrow biopsy is going to be next on the list of things that we have to do.  We will have to see what our labs show.  We will have to see what a 24-hour urine  shows.  Spent an hour with he and his wife.  They are both very very nice.  Over half the time was spent face-to-face going over the lab work and talking about my recommendations.

## 2018-03-12 NOTE — Addendum Note (Signed)
Addended by: Burney Gauze R on: 03/12/2018 04:48 PM   Modules accepted: Orders

## 2018-03-13 ENCOUNTER — Encounter: Payer: Self-pay | Admitting: Hematology & Oncology

## 2018-03-13 DIAGNOSIS — D509 Iron deficiency anemia, unspecified: Secondary | ICD-10-CM

## 2018-03-13 HISTORY — DX: Iron deficiency anemia, unspecified: D50.9

## 2018-03-13 LAB — FERRITIN: Ferritin: 54 ng/mL (ref 22–316)

## 2018-03-13 LAB — IRON AND TIBC
Iron: 64 ug/dL (ref 42–163)
SATURATION RATIOS: 20 % — AB (ref 42–163)
TIBC: 313 ug/dL (ref 202–409)
UIBC: 249 ug/dL

## 2018-03-13 LAB — BETA 2 MICROGLOBULIN, SERUM: Beta-2 Microglobulin: 2.1 mg/L (ref 0.6–2.4)

## 2018-03-13 LAB — KAPPA/LAMBDA LIGHT CHAINS
KAPPA, LAMDA LIGHT CHAIN RATIO: 7.65 — AB (ref 0.26–1.65)
Kappa free light chain: 91 mg/L — ABNORMAL HIGH (ref 3.3–19.4)
LAMDA FREE LIGHT CHAINS: 11.9 mg/L (ref 5.7–26.3)

## 2018-03-13 LAB — IGG, IGA, IGM
IGM (IMMUNOGLOBULIN M), SRM: 992 mg/dL — AB (ref 20–172)
IgA: 19 mg/dL — ABNORMAL LOW (ref 90–386)
IgG (Immunoglobin G), Serum: 450 mg/dL — ABNORMAL LOW (ref 700–1600)

## 2018-03-13 LAB — LACTATE DEHYDROGENASE: LDH: 198 U/L (ref 125–245)

## 2018-03-13 LAB — TSH: TSH: 1.535 u[IU]/mL (ref 0.320–4.118)

## 2018-03-13 NOTE — Addendum Note (Signed)
Addended by: Burney Gauze R on: 03/13/2018 02:59 PM   Modules accepted: Orders

## 2018-03-14 ENCOUNTER — Telehealth: Payer: Self-pay | Admitting: *Deleted

## 2018-03-14 NOTE — Telephone Encounter (Signed)
-----   Message from [name], MD sent at 03/13/2018  2:54 PM EDT ----- I left a message on their answering machine.  I told him that the CT scan did not show any obvious lymphoma.  There is no cancer on the CT scan.  His iron studies were low.  We will give him a dose of IV iron to see if this can help with his fatigue.  He will need a bone marrow test.  I talked to him about this when I saw him in the office actually.  I reconfirmed this.  I will try to get this set up in another week or so.  So far his lab work does look okay outside of the low iron.  I asked him to call me if he has any concerns.  Laurey Arrow

## 2018-03-17 LAB — IMMUNOFIXATION REFLEX, SERUM
IgA: 23 mg/dL — ABNORMAL LOW (ref 90–386)
IgG (Immunoglobin G), Serum: 475 mg/dL — ABNORMAL LOW (ref 700–1600)
IgM (Immunoglobulin M), Srm: 1053 mg/dL — ABNORMAL HIGH (ref 20–172)

## 2018-03-17 LAB — PROTEIN ELECTROPHORESIS, SERUM, WITH REFLEX
A/G Ratio: 1.2 (ref 0.7–1.7)
ALPHA-2-GLOBULIN: 0.7 g/dL (ref 0.4–1.0)
Albumin ELP: 3.7 g/dL (ref 2.9–4.4)
Alpha-1-Globulin: 0.3 g/dL (ref 0.0–0.4)
BETA GLOBULIN: 0.9 g/dL (ref 0.7–1.3)
GAMMA GLOBULIN: 1.1 g/dL (ref 0.4–1.8)
GLOBULIN, TOTAL: 3 g/dL (ref 2.2–3.9)
M-Spike, %: 0.7 g/dL — ABNORMAL HIGH
SPEP Interpretation: 0
Total Protein ELP: 6.7 g/dL (ref 6.0–8.5)

## 2018-03-18 ENCOUNTER — Telehealth: Payer: Self-pay | Admitting: *Deleted

## 2018-03-18 NOTE — Telephone Encounter (Signed)
Message received from patient wanting to speak with Dr. Marin Olp regarding information given to him on 03/14/18.  Call placed back to patient and Dr. Marin Olp spoke with pt regarding his questions.  Patient appreciative of call back.

## 2018-03-21 ENCOUNTER — Inpatient Hospital Stay: Payer: 59

## 2018-03-21 ENCOUNTER — Encounter: Payer: Self-pay | Admitting: *Deleted

## 2018-03-21 VITALS — BP 148/89 | HR 50 | Temp 97.6°F | Resp 20

## 2018-03-21 DIAGNOSIS — R768 Other specified abnormal immunological findings in serum: Secondary | ICD-10-CM | POA: Diagnosis not present

## 2018-03-21 DIAGNOSIS — D500 Iron deficiency anemia secondary to blood loss (chronic): Secondary | ICD-10-CM

## 2018-03-21 MED ORDER — SODIUM CHLORIDE 0.9 % IV SOLN
750.0000 mg | Freq: Once | INTRAVENOUS | Status: AC
  Administered 2018-03-21: 750 mg via INTRAVENOUS
  Filled 2018-03-21: qty 15

## 2018-03-21 MED ORDER — SODIUM CHLORIDE 0.9 % IV SOLN
INTRAVENOUS | Status: DC
  Administered 2018-03-21: 14:00:00 via INTRAVENOUS

## 2018-03-21 NOTE — Patient Instructions (Signed)
Ferric carboxymaltose injection What is this medicine? FERRIC CARBOXYMALTOSE (ferr-ik car-box-ee-mol-toes) is an iron complex. Iron is used to make healthy red blood cells, which carry oxygen and nutrients throughout the body. This medicine is used to treat anemia in people with chronic kidney disease or people who cannot take iron by mouth. This medicine may be used for other purposes; ask your health care provider or pharmacist if you have questions. COMMON BRAND NAME(S): Injectafer What should I tell my health care provider before I take this medicine? They need to know if you have any of these conditions: -anemia not caused by low iron levels -high levels of iron in the blood -liver disease -an unusual or allergic reaction to iron, other medicines, foods, dyes, or preservatives -pregnant or trying to get pregnant -breast-feeding How should I use this medicine? This medicine is for infusion into a vein. It is given by a health care professional in a hospital or clinic setting. Talk to your pediatrician regarding the use of this medicine in children. Special care may be needed. Overdosage: If you think you have taken too much of this medicine contact a poison control center or emergency room at once. NOTE: This medicine is only for you. Do not share this medicine with others. What if I miss a dose? It is important not to miss your dose. Call your doctor or health care professional if you are unable to keep an appointment. What may interact with this medicine? Do not take this medicine with any of the following medications: -deferoxamine -dimercaprol -other iron products This medicine may also interact with the following medications: -chloramphenicol -deferasirox This list may not describe all possible interactions. Give your health care provider a list of all the medicines, herbs, non-prescription drugs, or dietary supplements you use. Also tell them if you smoke, drink alcohol, or use  illegal drugs. Some items may interact with your medicine. What should I watch for while using this medicine? Visit your doctor or health care professional regularly. Tell your doctor if your symptoms do not start to get better or if they get worse. You may need blood work done while you are taking this medicine. You may need to follow a special diet. Talk to your doctor. Foods that contain iron include: whole grains/cereals, dried fruits, beans, or peas, leafy green vegetables, and organ meats (liver, kidney). What side effects may I notice from receiving this medicine? Side effects that you should report to your doctor or health care professional as soon as possible: -allergic reactions like skin rash, itching or hives, swelling of the face, lips, or tongue -breathing problems -changes in blood pressure -feeling faint or lightheaded, falls -flushing, sweating, or hot feelings Side effects that usually do not require medical attention (report to your doctor or health care professional if they continue or are bothersome): -changes in taste -constipation -dizziness -headache -nausea -pain, redness, or irritation at site where injected -vomiting This list may not describe all possible side effects. Call your doctor for medical advice about side effects. You may report side effects to FDA at 1-800-FDA-1088. Where should I keep my medicine? This drug is given in a hospital or clinic and will not be stored at home. NOTE: This sheet is a summary. It may not cover all possible information. If you have questions about this medicine, talk to your doctor, pharmacist, or health care provider.  2018 Elsevier/Gold Standard (2015-12-15 11:20:47)  

## 2018-03-24 ENCOUNTER — Other Ambulatory Visit: Payer: Self-pay | Admitting: Radiology

## 2018-03-24 ENCOUNTER — Encounter: Payer: 59 | Admitting: Psychology

## 2018-03-24 LAB — UPEP/TP, 24-HR URINE
ALPHA 1 UR: 2.9 %
Albumin, U: 16 %
Alpha 2, Urine: 9.1 %
BETA UR: 28.5 %
GAMMA UR: 43.5 %
M-SPIKE, %-U24PEL: 31.5 % — AB
M-Spike, mg/24 hr: 40.6 mg/24 hr — ABNORMAL HIGH
Total Protein, Urine-Ur/day: 129 mg/24 hr (ref 30–150)
Total Protein, Urine: 11.2 mg/dL
Total Volume: 1150

## 2018-03-25 ENCOUNTER — Encounter (HOSPITAL_COMMUNITY): Payer: Self-pay

## 2018-03-25 ENCOUNTER — Ambulatory Visit (HOSPITAL_COMMUNITY)
Admission: RE | Admit: 2018-03-25 | Discharge: 2018-03-25 | Disposition: A | Discharge status: Home / Self Care | Payer: 59 | Source: Ambulatory Visit | Attending: Hematology & Oncology | Admitting: Hematology & Oncology

## 2018-03-25 DIAGNOSIS — C8510 Unspecified B-cell lymphoma, unspecified site: Secondary | ICD-10-CM | POA: Diagnosis not present

## 2018-03-25 DIAGNOSIS — Z87891 Personal history of nicotine dependence: Secondary | ICD-10-CM | POA: Insufficient documentation

## 2018-03-25 DIAGNOSIS — M069 Rheumatoid arthritis, unspecified: Secondary | ICD-10-CM | POA: Insufficient documentation

## 2018-03-25 DIAGNOSIS — F419 Anxiety disorder, unspecified: Secondary | ICD-10-CM | POA: Insufficient documentation

## 2018-03-25 DIAGNOSIS — E785 Hyperlipidemia, unspecified: Secondary | ICD-10-CM | POA: Insufficient documentation

## 2018-03-25 DIAGNOSIS — Z7982 Long term (current) use of aspirin: Secondary | ICD-10-CM | POA: Diagnosis not present

## 2018-03-25 DIAGNOSIS — D472 Monoclonal gammopathy: Secondary | ICD-10-CM | POA: Diagnosis present

## 2018-03-25 DIAGNOSIS — Z79899 Other long term (current) drug therapy: Secondary | ICD-10-CM | POA: Diagnosis not present

## 2018-03-25 DIAGNOSIS — J449 Chronic obstructive pulmonary disease, unspecified: Secondary | ICD-10-CM | POA: Insufficient documentation

## 2018-03-25 DIAGNOSIS — K219 Gastro-esophageal reflux disease without esophagitis: Secondary | ICD-10-CM | POA: Insufficient documentation

## 2018-03-25 DIAGNOSIS — Z8619 Personal history of other infectious and parasitic diseases: Secondary | ICD-10-CM | POA: Diagnosis not present

## 2018-03-25 DIAGNOSIS — M109 Gout, unspecified: Secondary | ICD-10-CM | POA: Insufficient documentation

## 2018-03-25 DIAGNOSIS — Z87442 Personal history of urinary calculi: Secondary | ICD-10-CM | POA: Insufficient documentation

## 2018-03-25 DIAGNOSIS — F329 Major depressive disorder, single episode, unspecified: Secondary | ICD-10-CM | POA: Insufficient documentation

## 2018-03-25 HISTORY — DX: Personal history of other infectious and parasitic diseases: Z86.19

## 2018-03-25 HISTORY — DX: Fracture of unspecified parts of lumbosacral spine and pelvis, initial encounter for closed fracture: S32.9XXA

## 2018-03-25 LAB — CBC WITH DIFFERENTIAL/PLATELET
BASOS ABS: 0 10*3/uL (ref 0.0–0.1)
BASOS PCT: 1 %
EOS ABS: 0.2 10*3/uL (ref 0.0–0.7)
EOS PCT: 3 %
HCT: 40.5 % (ref 39.0–52.0)
Hemoglobin: 13.6 g/dL (ref 13.0–17.0)
LYMPHS PCT: 34 %
Lymphs Abs: 1.6 10*3/uL (ref 0.7–4.0)
MCH: 32.3 pg (ref 26.0–34.0)
MCHC: 33.6 g/dL (ref 30.0–36.0)
MCV: 96.2 fL (ref 78.0–100.0)
MONO ABS: 0.8 10*3/uL (ref 0.1–1.0)
Monocytes Relative: 17 %
Neutro Abs: 2.1 10*3/uL (ref 1.7–7.7)
Neutrophils Relative %: 45 %
Platelets: 265 10*3/uL (ref 150–400)
RBC: 4.21 MIL/uL — ABNORMAL LOW (ref 4.22–5.81)
RDW: 13.9 % (ref 11.5–15.5)
WBC: 4.7 10*3/uL (ref 4.0–10.5)

## 2018-03-25 LAB — PROTIME-INR
INR: 0.93
PROTHROMBIN TIME: 12.3 s (ref 11.4–15.2)

## 2018-03-25 MED ORDER — FENTANYL CITRATE (PF) 100 MCG/2ML IJ SOLN
INTRAMUSCULAR | Status: AC
  Filled 2018-03-25: qty 4

## 2018-03-25 MED ORDER — MIDAZOLAM HCL 2 MG/2ML IJ SOLN
INTRAMUSCULAR | Status: AC
  Filled 2018-03-25: qty 4

## 2018-03-25 MED ORDER — HYDROCODONE-ACETAMINOPHEN 5-325 MG PO TABS: 1.0000 | ORAL_TABLET | ORAL | Status: DC | PRN

## 2018-03-25 MED ORDER — FENTANYL CITRATE (PF) 100 MCG/2ML IJ SOLN
INTRAMUSCULAR | Status: AC | PRN
  Administered 2018-03-25 (×2): 50 ug via INTRAVENOUS

## 2018-03-25 MED ORDER — SODIUM CHLORIDE 0.9 % IV SOLN
INTRAVENOUS | Status: DC
  Administered 2018-03-25: 07:00:00 via INTRAVENOUS

## 2018-03-25 MED ORDER — MIDAZOLAM HCL 2 MG/2ML IJ SOLN
INTRAMUSCULAR | Status: AC | PRN
  Administered 2018-03-25 (×2): 1 mg via INTRAVENOUS

## 2018-03-25 MED ORDER — LIDOCAINE HCL 1 % IJ SOLN
INTRAMUSCULAR | Status: AC | PRN
  Administered 2018-03-25: 10 mL via INTRADERMAL

## 2018-03-25 NOTE — Consult Note (Signed)
Chief Complaint: Patient was seen in consultation today for CT guided bone marrow biopsy  Referring Physician(s): Ennever,Peter R  Supervising Physician: Markus Daft  Patient Status: [facility] - Out-pt  History of Present Illness: Edwin Webster is a 57 y.o. male with history of MGUS /elevated IgM level/kappa spike/elevated free light chain and kappa/lambda light chain ratio who presents today for CT guided bone marrow biopsy for further evaluation.   Past Medical History:  Diagnosis Date  . Allergy   . Anemia   . Anxiety   . Arthritis   . Chest pain, non-cardiac   . COPD (chronic obstructive pulmonary disease) ([place])   . Depression   . Diverticulitis   . Fractured pelvis ([place])    fall from ladder  . GERD (gastroesophageal reflux disease)   . Gout   . History of shingles   . Hyperlipidemia   . Iron deficiency anemia 03/13/2018  . Kidney stones   . Rheumatoid arthritis ([place])    hands    Past Surgical History:  Procedure Laterality Date  . COLONOSCOPY    . FINGER SURGERY    . iron infusion    . KNEE SURGERY     right  . ROTATOR CUFF REPAIR  1996   left    Allergies: Humira [adalimumab]  Medications: Prior to Admission medications   Medication Sig Start Date End Date Taking? Authorizing Provider  aspirin EC 81 MG tablet Take 81 mg daily by mouth.   Yes [provider]  bisoprolol (ZEBETA) 10 MG tablet TAKE 1/2 TO 1 TABLET DAILY FOR BP Patient taking differently: TAKE 1/2  tab daily 03/12/18 11/12/17  Yes Vicie Mutters, PA-C  Cholecalciferol (VITAMIN D-3) 5000 UNITS TABS Take 10,000 Units by mouth daily.    Yes [provider]  escitalopram (LEXAPRO) 20 MG tablet Take 1 tablet (20 mg total) by mouth daily. 01/23/18  Yes Vicie Mutters, PA-C  famotidine (PEPCID) 40 MG tablet Take 1/2-1 tab with ibuprofen as needed. 02/12/18  Yes Liane Comber, NP  folic acid (FOLVITE) 1 MG tablet Take 1 mg daily by mouth.   Yes [provider]  ibuprofen  (ADVIL,MOTRIN) 800 MG tablet Take 1 tablet (800 mg total) by mouth daily as needed. 02/12/18  Yes Liane Comber, NP  levocetirizine (XYZAL) 5 MG tablet TAKE 1 TABLET (5 MG TOTAL) BY MOUTH EVERY EVENING. 08/05/17  Yes Vicie Mutters, PA-C  valACYclovir (VALTREX) 1000 MG tablet Take 1 tablet (1,000 mg total) by mouth 3 (three) times daily. Take for 7 days 02/24/18  Yes Liane Comber, NP  atorvastatin (LIPITOR) 80 MG tablet Take 1/2 to 1 tablet as directed for cholesterol Patient taking differently: Take 40 mg by mouth daily. Take 1/2 to 1 tablet as directed for cholesterol 11/01/14 02/24/18  Unk Pinto, MD  fluticasone (FLONASE) 50 MCG/ACT nasal spray PLACE 2 SPRAYS INTO BOTH NOSTRILS DAILY. Patient taking differently: PLACE 2 SPRAYS INTO BOTH NOSTRILS DAILY.  Takes prn. 03/06/16   Unk Pinto, MD     Family History  Problem Relation Age of Onset  . Hyperlipidemia Mother   . COPD Mother   . Heart failure Mother   . Cancer Mother        breast  . Hyperlipidemia Brother   . Stroke Maternal Grandfather   . COPD Maternal Grandfather        colon  . Colon cancer Maternal Grandfather 58       died at 7  . Hypertension Father   . Esophageal  cancer Neg Hx   . Stomach cancer Neg Hx   . Rectal cancer Neg Hx     Social History   Socioeconomic History  . Marital status: Married    Spouse name: Not on file  . Number of children: Not on file  . Years of education: Not on file  . Highest education level: Not on file  Occupational History  . Not on file  Social Needs  . Financial resource strain: Not on file  . Food insecurity:    Worry: Not on file    Inability: Not on file  . Transportation needs:    Medical: Not on file    Non-medical: Not on file  Tobacco Use  . Smoking status: Former Smoker    Packs/day: 2.00    Types: Cigarettes    Last attempt to quit: 07/03/2010    Years since quitting: 7.7  . Smokeless tobacco: Never Used  Substance and Sexual Activity  . Alcohol  use: No    Alcohol/week: 0.0 oz  . Drug use: No  . Sexual activity: Not on file  Lifestyle  . Physical activity:    Days per week: Not on file    Minutes per session: Not on file  . Stress: Not on file  Relationships  . Social connections:    Talks on phone: Not on file    Gets together: Not on file    Attends religious service: Not on file    Active member of club or organization: Not on file    Attends meetings of clubs or organizations: Not on file    Relationship status: Not on file  Other Topics Concern  . Not on file  Social History Narrative  . Not on file      Review of Systems denies fever, headache, chest pain, dyspnea, cough, abdominal/back pain, nausea, vomiting or bleeding.  He does have weight loss and fatigue.  Vital Signs: Vitals:   03/25/18 0839  BP: 136/83  Pulse: (!) 50  Resp: 16  Temp: 98 F (36.7 C)  SpO2: 99%       Physical Exam awake, alert.  Chest clear to auscultation bilaterally.  Heart with bradycardic but regular rhythm; abdomen soft, positive bowel sounds, nontender.  No lower extremity edema.  Imaging: Ct Chest W Contrast  Result Date: 03/12/2018 CLINICAL DATA:  Enlarged lymph nodes. Fatigue, weight loss. RIGHT flank pain EXAM: CT CHEST, ABDOMEN, AND PELVIS WITH CONTRAST TECHNIQUE: Multidetector CT imaging of the chest, abdomen and pelvis was performed following the standard protocol during bolus administration of intravenous contrast. CONTRAST:  15m ISOVUE-300 IOPAMIDOL (ISOVUE-300) INJECTION 61% COMPARISON:  CT pelvis 06/18/2017 FINDINGS: CT CHEST FINDINGS Cardiovascular: Coronary artery calcification and aortic atherosclerotic calcification. Mediastinum/Nodes: No axillary supraclavicular adenopathy. No mediastinal hilar adenopathy. No pericardial effusion. Esophagus normal. Lungs/Pleura: No suspicious pulmonary nodules.  Airways normal. Musculoskeletal: No aggressive osseous lesion. CT ABDOMEN AND PELVIS FINDINGS Hepatobiliary: No focal  hepatic lesion. No biliary ductal dilatation. Gallbladder is normal. Common bile duct is normal. Pancreas: Pancreas is normal. No ductal dilatation. No pancreatic inflammation. Spleen: Normal spleen Adrenals/urinary tract: Adrenal glands and kidneys are normal. The ureters and bladder normal. Stomach/Bowel: Stomach, small bowel, and cecum are normal. The colon and rectosigmoid colon are normal. Vascular/Lymphatic: Abdominal aorta is normal caliber with atherosclerotic calcification. There is no retroperitoneal or periportal lymphadenopathy. No pelvic lymphadenopathy. Reproductive: Prostate normal Other: No free fluid. Musculoskeletal: No aggressive osseous lesion. IMPRESSION: Chest Impression: No thoracic lymphadenopathy. Coronary artery calcification Abdomen /  Pelvis Impression: 1. No adenopathy in the abdomen pelvis. 2. Normal spleen. 3.  Aortic Atherosclerosis (ICD10-I70.0). Electronically Signed   By: Suzy Bouchard M.D.   On: 03/12/2018 19:11   Ct Abdomen Pelvis W Contrast  Result Date: 03/12/2018 CLINICAL DATA:  Enlarged lymph nodes. Fatigue, weight loss. RIGHT flank pain EXAM: CT CHEST, ABDOMEN, AND PELVIS WITH CONTRAST TECHNIQUE: Multidetector CT imaging of the chest, abdomen and pelvis was performed following the standard protocol during bolus administration of intravenous contrast. CONTRAST:  158m ISOVUE-300 IOPAMIDOL (ISOVUE-300) INJECTION 61% COMPARISON:  CT pelvis 06/18/2017 FINDINGS: CT CHEST FINDINGS Cardiovascular: Coronary artery calcification and aortic atherosclerotic calcification. Mediastinum/Nodes: No axillary supraclavicular adenopathy. No mediastinal hilar adenopathy. No pericardial effusion. Esophagus normal. Lungs/Pleura: No suspicious pulmonary nodules.  Airways normal. Musculoskeletal: No aggressive osseous lesion. CT ABDOMEN AND PELVIS FINDINGS Hepatobiliary: No focal hepatic lesion. No biliary ductal dilatation. Gallbladder is normal. Common bile duct is normal. Pancreas:  Pancreas is normal. No ductal dilatation. No pancreatic inflammation. Spleen: Normal spleen Adrenals/urinary tract: Adrenal glands and kidneys are normal. The ureters and bladder normal. Stomach/Bowel: Stomach, small bowel, and cecum are normal. The colon and rectosigmoid colon are normal. Vascular/Lymphatic: Abdominal aorta is normal caliber with atherosclerotic calcification. There is no retroperitoneal or periportal lymphadenopathy. No pelvic lymphadenopathy. Reproductive: Prostate normal Other: No free fluid. Musculoskeletal: No aggressive osseous lesion. IMPRESSION: Chest Impression: No thoracic lymphadenopathy. Coronary artery calcification Abdomen / Pelvis Impression: 1. No adenopathy in the abdomen pelvis. 2. Normal spleen. 3.  Aortic Atherosclerosis (ICD10-I70.0). Electronically Signed   By: SSuzy BouchardM.D.   On: 03/12/2018 19:11    Labs:  CBC: Recent Labs    02/18/18 1635 02/24/18 1650 03/12/18 1439 03/25/18 0718  WBC 5.5 5.4 8.5 4.7  HGB 12.1* 12.3* 13.2 13.6  HCT 34.9* 35.1* 38.7 40.5  PLT 238 246 253 265    COAGS: Recent Labs    03/25/18 0718  INR 0.93    BMP: Recent Labs    10/24/17 1641 02/12/18 1641 02/18/18 1635 02/24/18 1650 03/12/18 1439  NA 140 139 141 145 143  K 4.6 3.8 4.2 4.7 3.5  CL 103 104 106 109 105  CO2 _0 GLUCOSE 83 92 79 84 96  BUN _1 CALCIUM 9.6 9.1 9.0 9.5 9.8  CREATININE 1.11 1.37* 1.29 1.33 1.00  GFRNONAA 73 56* 60 58*  --   GFRAA 84 65 70 67  --     LIVER FUNCTION TESTS: Recent Labs    04/16/17 1712 10/24/17 1641 02/12/18 1641 02/18/18 1635 03/12/18 1439  BILITOT 0.5 0.4 0.5 0.5 0.8  AST _2 ALT _3 ALKPHOS 74  --   --   --  70  PROT 6.8 6.9 6.5 6.6  6.7 7.4  ALBUMIN 4.4  --   --   --  3.9    TUMOR MARKERS: No results for input(s): AFPTM, CEA, CA199, CHROMGRNA in the last 8760 hours.  Assessment and Plan: 57y.o. male with history of MGUS /elevated IgM  level/kappa spike/elevated free light chain and kappa/lambda light chain ratio who presents today for CT guided bone marrow biopsy for further evaluation. Risks and benefits discussed with the patient/family including, but not limited to bleeding, infection, damage to adjacent structures or low yield requiring additional tests.  All of the patient's questions were answered, patient is agreeable to proceed. Consent signed and in chart.  Thank you for this interesting consult.  I greatly enjoyed meeting CINQUE BEGLEY and look forward to participating in their care.  A copy of this report was sent to the requesting provider on this date.  Electronically Signed: D. Rowe Robert, PA-C 03/25/2018, 8:19 AM   I spent a total of  20 minutes   in face to face in clinical consultation, greater than 50% of which was counseling/coordinating care for CT guided bone marrow biopsy

## 2018-03-25 NOTE — Discharge Instructions (Signed)
You may remove dressing and bathe in 24 hours.  ° ° °Bone Marrow Aspiration and Bone Marrow Biopsy, Adult, Care After °This sheet gives you information about how to care for yourself after your procedure. Your health care provider may also give you more specific instructions. If you have problems or questions, contact your health care provider. °What can I expect after the procedure? °After the procedure, it is common to have: °· Mild pain and tenderness. °· Swelling. °· Bruising. ° °Follow these instructions at home: °· Take over-the-counter or prescription medicines only as told by your health care provider. °· Do not take baths, swim, or use a hot tub until your health care provider approves. Ask if you can take a shower or have a sponge bath. °· Follow instructions from your health care provider about how to take care of the puncture site. Make sure you: °? Wash your hands with soap and water before you change your bandage (dressing). If soap and water are not available, use hand sanitizer. °? Change your dressing as told by your health care provider. °· Check your puncture site every day for signs of infection. Check for: °? More redness, swelling, or pain. °? More fluid or blood. °? Warmth. °? Pus or a bad smell. °· Return to your normal activities as told by your health care provider. Ask your health care provider what activities are safe for you. °· Do not drive for 24 hours if you were given a medicine to help you relax (sedative). °· Keep all follow-up visits as told by your health care provider. This is important. °Contact a health care provider if: °· You have more redness, swelling, or pain around the puncture site. °· You have more fluid or blood coming from the puncture site. °· Your puncture site feels warm to the touch. °· You have pus or a bad smell coming from the puncture site. °· You have a fever. °· Your pain is not controlled with medicine. °This information is not intended to replace advice  given to you by your health care provider. Make sure you discuss any questions you have with your health care provider. °Document Released: 06/01/2005 Document Revised: 06/01/2016 Document Reviewed: 04/25/2016 °Elsevier Interactive Patient Education © 2018 Elsevier Inc. ° ° ° ° °Moderate Conscious Sedation, Adult, Care After °These instructions provide you with information about caring for yourself after your procedure. Your health care provider may also give you more specific instructions. Your treatment has been planned according to current medical practices, but problems sometimes occur. Call your health care provider if you have any problems or questions after your procedure. °What can I expect after the procedure? °After your procedure, it is common: °· To feel sleepy for several hours. °· To feel clumsy and have poor balance for several hours. °· To have poor judgment for several hours. °· To vomit if you eat too soon. ° °Follow these instructions at home: °For at least 24 hours after the procedure: ° °· Do not: °? Participate in activities where you could fall or become injured. °? Drive. °? Use heavy machinery. °? Drink alcohol. °? Take sleeping pills or medicines that cause drowsiness. °? Make important decisions or sign legal documents. °? Take care of children on your own. °· Rest. °Eating and drinking °· Follow the diet recommended by your health care provider. °· If you vomit: °? Drink water, juice, or soup when you can drink without vomiting. °? Make sure you have little or no nausea before   eating solid foods. °General instructions °· Have a responsible adult stay with you until you are awake and alert. °· Take over-the-counter and prescription medicines only as told by your health care provider. °· If you smoke, do not smoke without supervision. °· Keep all follow-up visits as told by your health care provider. This is important. °Contact a health care provider if: °· You keep feeling nauseous or you  keep vomiting. °· You feel light-headed. °· You develop a rash. °· You have a fever. °Get help right away if: °· You have trouble breathing. °This information is not intended to replace advice given to you by your health care provider. Make sure you discuss any questions you have with your health care provider. °Document Released: 09/02/2013 Document Revised: 04/16/2016 Document Reviewed: 03/03/2016 °Elsevier Interactive Patient Education © 2018 Elsevier Inc. ° °

## 2018-03-25 NOTE — Procedures (Signed)
CT guided bone marrow biopsy.  2 aspirates and 1 core.  Minimal blood loss and no immediate complication. 

## 2018-03-26 ENCOUNTER — Telehealth: Payer: Self-pay

## 2018-03-26 ENCOUNTER — Ambulatory Visit: Payer: Self-pay | Admitting: Neurology

## 2018-03-26 NOTE — Telephone Encounter (Signed)
Pt did not show for their appt with Dr. Athar today.  

## 2018-03-27 ENCOUNTER — Other Ambulatory Visit: Payer: Self-pay | Admitting: Hematology & Oncology

## 2018-03-27 ENCOUNTER — Encounter: Payer: Self-pay | Admitting: Neurology

## 2018-03-27 ENCOUNTER — Encounter: Payer: Self-pay | Admitting: Hematology & Oncology

## 2018-03-27 DIAGNOSIS — Z7189 Other specified counseling: Secondary | ICD-10-CM

## 2018-03-27 DIAGNOSIS — C8800 Waldenstrom macroglobulinemia not having achieved remission: Secondary | ICD-10-CM

## 2018-03-27 HISTORY — DX: Waldenstrom macroglobulinemia not having achieved remission: C88.00

## 2018-03-27 HISTORY — DX: Other specified counseling: Z71.89

## 2018-03-27 HISTORY — DX: Waldenstrom macroglobulinemia: C88.0

## 2018-03-28 ENCOUNTER — Encounter: Payer: Self-pay | Admitting: Hematology & Oncology

## 2018-03-28 ENCOUNTER — Inpatient Hospital Stay: Payer: 59

## 2018-03-28 ENCOUNTER — Inpatient Hospital Stay: Payer: 59 | Attending: Hematology & Oncology | Admitting: Hematology & Oncology

## 2018-03-28 ENCOUNTER — Encounter: Payer: Self-pay | Admitting: Adult Health

## 2018-03-28 ENCOUNTER — Other Ambulatory Visit: Payer: Self-pay

## 2018-03-28 VITALS — BP 121/78 | HR 50 | Temp 97.6°F | Resp 19 | Wt 171.0 lb

## 2018-03-28 DIAGNOSIS — Z5112 Encounter for antineoplastic immunotherapy: Secondary | ICD-10-CM | POA: Diagnosis not present

## 2018-03-28 DIAGNOSIS — Z5111 Encounter for antineoplastic chemotherapy: Secondary | ICD-10-CM | POA: Insufficient documentation

## 2018-03-28 DIAGNOSIS — C880 Waldenstrom macroglobulinemia: Secondary | ICD-10-CM

## 2018-03-28 DIAGNOSIS — D509 Iron deficiency anemia, unspecified: Secondary | ICD-10-CM | POA: Diagnosis not present

## 2018-03-28 DIAGNOSIS — D500 Iron deficiency anemia secondary to blood loss (chronic): Secondary | ICD-10-CM

## 2018-03-28 LAB — CBC WITH DIFFERENTIAL (CANCER CENTER ONLY)
BASOS ABS: 0 10*3/uL (ref 0.0–0.1)
BASOS PCT: 1 %
EOS ABS: 0.1 10*3/uL (ref 0.0–0.5)
Eosinophils Relative: 2 %
HEMATOCRIT: 36.9 % — AB (ref 38.7–49.9)
Hemoglobin: 12.7 g/dL — ABNORMAL LOW (ref 13.0–17.1)
Lymphocytes Relative: 35 %
Lymphs Abs: 1.9 10*3/uL (ref 0.9–3.3)
MCH: 33 pg (ref 28.0–33.4)
MCHC: 34.4 g/dL (ref 32.0–35.9)
MCV: 95.8 fL (ref 82.0–98.0)
Monocytes Absolute: 0.8 10*3/uL (ref 0.1–0.9)
Monocytes Relative: 15 %
NEUTROS ABS: 2.5 10*3/uL (ref 1.5–6.5)
NEUTROS PCT: 47 %
Platelet Count: 312 10*3/uL (ref 145–400)
RBC: 3.85 MIL/uL — AB (ref 4.20–5.70)
RDW: 13.3 % (ref 11.1–15.7)
WBC: 5.3 10*3/uL (ref 4.0–10.0)

## 2018-03-28 LAB — CMP (CANCER CENTER ONLY)
ALBUMIN: 4.1 g/dL (ref 3.5–5.0)
ALK PHOS: 84 U/L (ref 40–150)
ALT: 14 U/L (ref 0–55)
AST: 23 U/L (ref 5–34)
Anion gap: 5 (ref 3–11)
BILIRUBIN TOTAL: 0.5 mg/dL (ref 0.2–1.2)
BUN: 20 mg/dL (ref 7–26)
CO2: 25 mmol/L (ref 22–29)
CREATININE: 1.06 mg/dL (ref 0.70–1.30)
Calcium: 9.1 mg/dL (ref 8.4–10.4)
Chloride: 108 mmol/L (ref 98–109)
GFR, Est AFR Am: >60 mL/min (ref >60)
GFR, Estimated: >60 mL/min (ref >60)
GLUCOSE: 86 mg/dL (ref 70–140)
Potassium: 4 mmol/L (ref 3.5–5.1)
SODIUM: 138 mmol/L (ref 136–145)
TOTAL PROTEIN: 7.1 g/dL (ref 6.4–8.3)

## 2018-03-28 MED ORDER — FAMCICLOVIR 500 MG PO TABS: 500.0000 mg | ORAL_TABLET | Freq: Every day | ORAL | 8 refills | Status: DC

## 2018-03-28 NOTE — Progress Notes (Signed)
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 

## 2018-03-28 NOTE — Progress Notes (Signed)
Hematology and Oncology Follow Up Visit  PAXON PROPES [id number] 04/21/1961 57 y.o. 03/28/2018   Principle Diagnosis:   Waldenstrom's macroglobulinemia  Iron deficiency anemia  Current Therapy:    Rituxan/bendamustine-cycle #1 to start on 04/10/2018  IV iron as indicated-last dose given on 02/2018     Interim History:  [name] is back for follow-up.  We now have a diagnosis for [name].  We first saw him back on April 17.  At that time, I thought that he would have a lymphoma.  We did CT scans on him.  The CT scans were negative for any obvious lymphadenopathy.  There was no splenomegaly.  We then did a bone marrow biopsy on him.  This was done on March 25, 2018.  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  His immunoglobulin studies showed an M spike of 0.7 g/dL.  His IgM level was 1053 mg/dL.  His IgG level was 475 mg/dL.  He still feels quite tired and fatigued.  We did find that he had low iron.  His iron studies done back on 03/12/2018 showed a ferritin of 54 with an iron saturation of 20%.  We gave him a dose of Injectafer.  This did make him feel better for a little bit.  He is still trying to work.  Again he is "tired quite easily.  He has had no fever.  He has had no rashes.  He has had no issues with bowel or bladder incontinence.  He has had some occasional headaches.  His appetite is doing okay.  He has had no nausea or vomiting.  He is not a vegetarian.  He has had no tingling or pain in the hands or feet.  Overall, his performance status is ECOG 1.    Medications:  Current Outpatient Medications:  .  aspirin EC 81 MG tablet, Take 81 mg daily by mouth., Disp: , Rfl:  .  atorvastatin (LIPITOR) 80 MG tablet, Take 1/2 to 1 tablet as directed for cholesterol (Patient taking differently: Take 40 mg by mouth daily. Take 1/2 to 1 tablet as directed for cholesterol), Disp: 90 tablet,  Rfl: 3 .  bisoprolol (ZEBETA) 10 MG tablet, TAKE 1/2 TO 1 TABLET DAILY FOR BP (Patient taking differently: TAKE 1/2  tab daily 03/12/18), Disp: 90 tablet, Rfl: 1 .  Cholecalciferol (VITAMIN D-3) 5000 UNITS TABS, Take 10,000 Units by mouth daily. , Disp: , Rfl:  .  escitalopram (LEXAPRO) 20 MG tablet, Take 1 tablet (20 mg total) by mouth daily., Disp: 90 tablet, Rfl: 1 .  famciclovir (FAMVIR) 500 MG tablet, Take 1 tablet (500 mg total) by mouth daily., Disp: 30 tablet, Rfl: 8 .  famotidine (PEPCID) 40 MG tablet, Take 1/2-1 tab with ibuprofen as needed., Disp: 90 tablet, Rfl: 1 .  fluticasone (FLONASE) 50 MCG/ACT nasal spray, PLACE 2 SPRAYS INTO BOTH NOSTRILS DAILY. (Patient taking differently: PLACE 2 SPRAYS INTO BOTH NOSTRILS DAILY.  Takes prn.), Disp: 16 g, Rfl: 2 .  folic acid (FOLVITE) 1 MG tablet, Take 1 mg daily by mouth., Disp: , Rfl:  .  ibuprofen (ADVIL,MOTRIN) 800 MG tablet, Take 1 tablet (800 mg total) by mouth daily as needed., Disp: 90 tablet, Rfl: 1 .  levocetirizine (XYZAL) 5 MG tablet, TAKE 1 TABLET (5 MG TOTAL) BY MOUTH EVERY EVENING., Disp: 90 tablet, Rfl: 3 .  valACYclovir (VALTREX) 1000 MG tablet, Take 1 tablet (1,000 mg total) by mouth 3 (three)  times daily. Take for 7 days, Disp: 21 tablet, Rfl: 0  Allergies:  Allergies  Allergen Reactions  . Humira [Adalimumab] Other (See Comments)    "blacked out", confused    Past Medical History, Surgical history, Social history, and Family History were reviewed and updated.  Review of Systems: Review of Systems  Constitutional: Positive for fatigue.  HENT:  Negative.   Eyes: Negative.   Respiratory: Positive for shortness of breath.   Cardiovascular: Negative.   Gastrointestinal: Negative.   Endocrine: Negative.   Genitourinary: Negative.    Musculoskeletal: Positive for arthralgias and myalgias.  Skin: Negative.   Neurological: Positive for headaches.  Hematological: Negative.   Psychiatric/Behavioral: Negative.      Physical Exam:  weight is 171 lb (77.6 kg). His oral temperature is 97.6 F (36.4 C). His blood pressure is 121/78 and his pulse is 50 (abnormal). His respiration is 19 and oxygen saturation is 99%.   Wt Readings from Last 3 Encounters:  03/28/18 171 lb (77.6 kg)  03/25/18 171 lb (77.6 kg)  03/12/18 171 lb 0.6 oz (77.6 kg)    Physical Exam  Constitutional: He is oriented to person, place, and time.  HENT:  Head: Normocephalic and atraumatic.  Mouth/Throat: Oropharynx is clear and moist.  Eyes: Pupils are equal, round, and reactive to light. EOM are normal.  Neck: Normal range of motion.  Cardiovascular: Normal rate, regular rhythm and normal heart sounds.  Pulmonary/Chest: Effort normal and breath sounds normal.  Abdominal: Soft. Bowel sounds are normal.  Musculoskeletal: Normal range of motion. He exhibits no edema, tenderness or deformity.  Lymphadenopathy:    He has no cervical adenopathy.  Neurological: He is alert and oriented to person, place, and time.  Skin: Skin is warm and dry. No rash noted. No erythema.  Psychiatric: He has a normal mood and affect. His behavior is normal. Judgment and thought content normal.  Vitals reviewed.    Lab Results  Component Value Date   WBC 5.3 03/28/2018   HGB 12.7 (L) 03/28/2018   HCT 36.9 (L) 03/28/2018   MCV 95.8 03/28/2018   PLT 312 03/28/2018     Chemistry      Component Value Date/Time   NA 143 03/12/2018 1439   K 3.5 03/12/2018 1439   CL 105 03/12/2018 1439   CO2 30 03/12/2018 1439   BUN 19 03/12/2018 1439   CREATININE 1.00 03/12/2018 1439   CREATININE 1.33 02/24/2018 1650      Component Value Date/Time   CALCIUM 9.8 03/12/2018 1439   ALKPHOS 70 03/12/2018 1439   AST 22 03/12/2018 1439   ALT 30 03/12/2018 1439   BILITOT 0.8 03/12/2018 1439       Impression and Plan: [name] is a 57-year-old white male.  He has Waldenstrm's macroglobulinemia.  He is symptomatic despite the fact that his IgM level is  not all that bad.  I think we have to begin treatment on him.  I would like to get a PET scan on him.  I think this would be reasonable.  I spent about 45 minutes with he and his wife.  I talked about the diagnosis of Waldenstrm's.  I explained what it is.  I explained that it is a form of lymphoma that he probably has had for a couple years already.  I think the standard treatment probably would be Rituxan/bendamustine.  I think he would be a good candidate for this.  I gave him information sheets about each medication.  I   do not think he needs to have a Port-A-Cath placed.  He has relatively decent peripheral IV access.  Since we are just treating him 2 days a month, I do not think that a Port-A-Cath would add to his quality of life.  I we will treat him with 6 cycles of treatment.  Depending on the PET scan results, we may do a PET scan after the second cycle.  If the PET scan is negative, then he will not need a follow-up PET scan.  He will need to have a follow-up bone marrow biopsy however.  I have asked that he should tolerate treatment quite well.  I do not think he will miss too much in the way of work but I did recommend that he take off work the first month of treatment to see how he tolerates the treatment.  I did go ahead and send in a prescription for Famvir (500 mg p.o. daily) to help with shingles prophylaxis.  He has already had a bout of shingles.  I suspect this probably was related to him having the Waldenstrm's..  We will plan to start treatment on May 16.  I will plan to see him back in June on the first day of his second cycle of treatment.  I spent over half the time with he has wife face-to-face reviewing his bone marrow results, going over the side effects of treatment, and answering their questions.    R , MD 5/3/20191:46 PM 

## 2018-03-29 LAB — HEPATITIS PANEL, ACUTE
HCV Ab: <0.1 s/co ratio (ref 0.0–0.9)
HEP A IGM: NEGATIVE
HEP B C IGM: NEGATIVE
HEP B S AG: NEGATIVE

## 2018-03-29 LAB — IGG, IGA, IGM
IGA: 23 mg/dL — AB (ref 90–386)
IGM (IMMUNOGLOBULIN M), SRM: 1089 mg/dL — AB (ref 20–172)
IgG (Immunoglobin G), Serum: 418 mg/dL — ABNORMAL LOW (ref 700–1600)

## 2018-03-31 LAB — KAPPA/LAMBDA LIGHT CHAINS
KAPPA, LAMDA LIGHT CHAIN RATIO: 7.78 — AB (ref 0.26–1.65)
Kappa free light chain: 93.4 mg/L — ABNORMAL HIGH (ref 3.3–19.4)
Lambda free light chains: 12 mg/L (ref 5.7–26.3)

## 2018-04-01 LAB — VISCOSITY, SERUM: VISCOSITY, SERUM: 2.1 rel.saline — AB (ref 1.6–1.9)

## 2018-04-02 ENCOUNTER — Encounter (HOSPITAL_COMMUNITY): Payer: Self-pay | Admitting: Hematology & Oncology

## 2018-04-02 LAB — PROTEIN ELECTROPHORESIS, SERUM, WITH REFLEX
A/G Ratio: 1.3 (ref 0.7–1.7)
Albumin ELP: 3.8 g/dL (ref 2.9–4.4)
Alpha-1-Globulin: 0.2 g/dL (ref 0.0–0.4)
Alpha-2-Globulin: 0.7 g/dL (ref 0.4–1.0)
BETA GLOBULIN: 0.8 g/dL (ref 0.7–1.3)
GLOBULIN, TOTAL: 2.9 g/dL (ref 2.2–3.9)
Gamma Globulin: 1.2 g/dL (ref 0.4–1.8)
M-SPIKE, %: 0.7 g/dL — AB
SPEP INTERP: 0
Total Protein ELP: 6.7 g/dL (ref 6.0–8.5)

## 2018-04-02 LAB — IMMUNOFIXATION REFLEX, SERUM
IGG (IMMUNOGLOBIN G), SERUM: 424 mg/dL — AB (ref 700–1600)
IGM (IMMUNOGLOBULIN M), SRM: 1169 mg/dL — AB (ref 20–172)
IgA: 23 mg/dL — ABNORMAL LOW (ref 90–386)

## 2018-04-03 ENCOUNTER — Ambulatory Visit (HOSPITAL_COMMUNITY)
Admission: RE | Admit: 2018-04-03 | Discharge: 2018-04-03 | Disposition: A | Discharge status: Home / Self Care | Payer: 59 | Source: Ambulatory Visit | Attending: Hematology & Oncology | Admitting: Hematology & Oncology

## 2018-04-03 DIAGNOSIS — J432 Centrilobular emphysema: Secondary | ICD-10-CM | POA: Insufficient documentation

## 2018-04-03 DIAGNOSIS — I77811 Abdominal aortic ectasia: Secondary | ICD-10-CM | POA: Diagnosis not present

## 2018-04-03 DIAGNOSIS — C880 Waldenstrom macroglobulinemia: Secondary | ICD-10-CM | POA: Diagnosis present

## 2018-04-03 DIAGNOSIS — I700 Atherosclerosis of aorta: Secondary | ICD-10-CM | POA: Insufficient documentation

## 2018-04-03 DIAGNOSIS — J438 Other emphysema: Secondary | ICD-10-CM | POA: Insufficient documentation

## 2018-04-03 DIAGNOSIS — I2510 Atherosclerotic heart disease of native coronary artery without angina pectoris: Secondary | ICD-10-CM | POA: Insufficient documentation

## 2018-04-03 DIAGNOSIS — N200 Calculus of kidney: Secondary | ICD-10-CM | POA: Diagnosis not present

## 2018-04-03 LAB — CHROMOSOME ANALYSIS, BONE MARROW

## 2018-04-03 LAB — GLUCOSE, CAPILLARY: GLUCOSE-CAPILLARY: 90 mg/dL (ref 65–99)

## 2018-04-03 MED ORDER — FLUDEOXYGLUCOSE F - 18 (FDG) INJECTION
8.5000 | Freq: Once | INTRAVENOUS | Status: AC | PRN
  Administered 2018-04-03: 8.5 via INTRAVENOUS

## 2018-04-04 ENCOUNTER — Encounter: Payer: Self-pay | Admitting: Hematology & Oncology

## 2018-04-04 ENCOUNTER — Telehealth: Payer: Self-pay | Admitting: *Deleted

## 2018-04-04 ENCOUNTER — Ambulatory Visit: Payer: 59 | Admitting: Hematology & Oncology

## 2018-04-04 ENCOUNTER — Other Ambulatory Visit: Payer: 59

## 2018-04-04 NOTE — Telephone Encounter (Addendum)
-----   Message from [name], MD sent at 04/03/2018  4:32 PM EDT ----- Called patient to let him know that his PET scan does not show any obvious Waldenstroms!! Reviewed principles of Rituxan and Bendeka with patient.

## 2018-04-09 ENCOUNTER — Other Ambulatory Visit: Payer: Self-pay | Admitting: Family

## 2018-04-09 DIAGNOSIS — C880 Waldenstrom macroglobulinemia: Secondary | ICD-10-CM

## 2018-04-10 ENCOUNTER — Encounter: Payer: Self-pay | Admitting: *Deleted

## 2018-04-10 ENCOUNTER — Other Ambulatory Visit: Payer: Self-pay

## 2018-04-10 ENCOUNTER — Inpatient Hospital Stay: Payer: 59

## 2018-04-10 ENCOUNTER — Other Ambulatory Visit: Payer: Self-pay | Admitting: *Deleted

## 2018-04-10 VITALS — BP 124/66 | HR 53 | Temp 98.0°F | Resp 19

## 2018-04-10 DIAGNOSIS — C880 Waldenstrom macroglobulinemia: Secondary | ICD-10-CM

## 2018-04-10 DIAGNOSIS — Z5112 Encounter for antineoplastic immunotherapy: Secondary | ICD-10-CM | POA: Diagnosis not present

## 2018-04-10 LAB — CBC WITH DIFFERENTIAL (CANCER CENTER ONLY)
Basophils Absolute: 0 10*3/uL (ref 0.0–0.1)
Basophils Relative: 1 %
EOS PCT: 2 %
Eosinophils Absolute: 0.1 10*3/uL (ref 0.0–0.5)
HCT: 39.7 % (ref 38.7–49.9)
Hemoglobin: 13.4 g/dL (ref 13.0–17.1)
LYMPHS ABS: 1.8 10*3/uL (ref 0.9–3.3)
LYMPHS PCT: 32 %
MCH: 32.5 pg (ref 28.0–33.4)
MCHC: 33.8 g/dL (ref 32.0–35.9)
MCV: 96.4 fL (ref 82.0–98.0)
MONO ABS: 0.9 10*3/uL (ref 0.1–0.9)
MONOS PCT: 16 %
Neutro Abs: 2.8 10*3/uL (ref 1.5–6.5)
Neutrophils Relative %: 49 %
PLATELETS: 252 10*3/uL (ref 145–400)
RBC: 4.12 MIL/uL — AB (ref 4.20–5.70)
RDW: 13.7 % (ref 11.1–15.7)
WBC: 5.5 10*3/uL (ref 4.0–10.0)

## 2018-04-10 LAB — CMP (CANCER CENTER ONLY)
ALT: 21 U/L (ref 10–47)
ANION GAP: 8 (ref 5–15)
AST: 24 U/L (ref 11–38)
Albumin: 3.8 g/dL (ref 3.5–5.0)
Alkaline Phosphatase: 77 U/L (ref 26–84)
BUN: 15 mg/dL (ref 7–22)
CO2: 28 mmol/L (ref 18–33)
Calcium: 9.5 mg/dL (ref 8.0–10.3)
Chloride: 108 mmol/L (ref 98–108)
Creatinine: 0.9 mg/dL (ref 0.60–1.20)
GLUCOSE: 82 mg/dL (ref 73–118)
POTASSIUM: 4.3 mmol/L (ref 3.3–4.7)
Sodium: 144 mmol/L (ref 128–145)
TOTAL PROTEIN: 7.1 g/dL (ref 6.4–8.1)
Total Bilirubin: 0.8 mg/dL (ref 0.2–1.6)

## 2018-04-10 MED ORDER — ONDANSETRON HCL 8 MG PO TABS
8.0000 mg | ORAL_TABLET | Freq: Two times a day (BID) | ORAL | 1 refills | Status: DC | PRN
Start: 2018-04-10 — End: 2018-06-05

## 2018-04-10 MED ORDER — SODIUM CHLORIDE 0.9 % IV SOLN
90.0000 mg/m2 | Freq: Once | INTRAVENOUS | Status: AC
  Administered 2018-04-10: 175 mg via INTRAVENOUS
  Filled 2018-04-10: qty 7

## 2018-04-10 MED ORDER — PROCHLORPERAZINE MALEATE 10 MG PO TABS: 10.0000 mg | ORAL_TABLET | Freq: Four times a day (QID) | ORAL | 1 refills | Status: DC | PRN

## 2018-04-10 MED ORDER — SODIUM CHLORIDE 0.9 % IV SOLN
375.0000 mg/m2 | Freq: Once | INTRAVENOUS | Status: AC
  Administered 2018-04-10: 700 mg via INTRAVENOUS
  Filled 2018-04-10: qty 50

## 2018-04-10 MED ORDER — ONDANSETRON HCL 8 MG PO TABS: 8.0000 mg | ORAL_TABLET | Freq: Two times a day (BID) | ORAL | 1 refills | Status: DC | PRN

## 2018-04-10 MED ORDER — LORAZEPAM 0.5 MG PO TABS: 0.5000 mg | ORAL_TABLET | Freq: Four times a day (QID) | ORAL | 0 refills | Status: DC | PRN

## 2018-04-10 MED ORDER — ACETAMINOPHEN 325 MG PO TABS
ORAL_TABLET | ORAL | Status: AC
  Filled 2018-04-10: qty 2

## 2018-04-10 MED ORDER — ACETAMINOPHEN 325 MG PO TABS
650.0000 mg | ORAL_TABLET | Freq: Once | ORAL | Status: AC
  Administered 2018-04-10: 650 mg via ORAL

## 2018-04-10 MED ORDER — DEXAMETHASONE 4 MG PO TABS: 8.0000 mg | ORAL_TABLET | Freq: Every day | ORAL | 1 refills | Status: DC

## 2018-04-10 MED ORDER — DEXAMETHASONE SODIUM PHOSPHATE 10 MG/ML IJ SOLN
10.0000 mg | Freq: Once | INTRAMUSCULAR | Status: AC
  Administered 2018-04-10: 10 mg via INTRAVENOUS

## 2018-04-10 MED ORDER — DIPHENHYDRAMINE HCL 25 MG PO CAPS
ORAL_CAPSULE | ORAL | Status: AC
  Filled 2018-04-10: qty 2

## 2018-04-10 MED ORDER — PALONOSETRON HCL INJECTION 0.25 MG/5ML
INTRAVENOUS | Status: AC
  Filled 2018-04-10: qty 5

## 2018-04-10 MED ORDER — DIPHENHYDRAMINE HCL 25 MG PO CAPS
50.0000 mg | ORAL_CAPSULE | Freq: Once | ORAL | Status: AC
  Administered 2018-04-10: 50 mg via ORAL

## 2018-04-10 MED ORDER — DEXAMETHASONE SODIUM PHOSPHATE 10 MG/ML IJ SOLN
INTRAMUSCULAR | Status: AC
  Filled 2018-04-10: qty 1

## 2018-04-10 MED ORDER — PALONOSETRON HCL INJECTION 0.25 MG/5ML
0.2500 mg | Freq: Once | INTRAVENOUS | Status: AC
  Administered 2018-04-10: 0.25 mg via INTRAVENOUS

## 2018-04-10 MED ORDER — SODIUM CHLORIDE 0.9 % IV SOLN
Freq: Once | INTRAVENOUS | Status: AC
  Administered 2018-04-10: 09:00:00 via INTRAVENOUS

## 2018-04-10 NOTE — Patient Instructions (Addendum)
[facility] Discharge Instructions for Patients Receiving Chemotherapy  Today you received the following chemotherapy agents Rituxan, Bendeka  To help prevent nausea and vomiting after your treatment, we encourage you to take your nausea medication :  Dexamethasone (Decadron) Beginning the day after Bendamustine Chemotherapy take 2 tablets by mouth daily for 2 days.  Take with food.    Ondansetron (Zofran) Beginning 2 days after Bendamustine can take 1 tablet twice daily as needed for nausea or vomiting.   Prochlorperazine (Compazine) Take 1 tablet ( 10 mg total) by mouth every 6 hours as needed for nausea or vomiting.    Ativan (Lorazepam) Take 1 tablet by mouth every 6 hours as needed for nauseas/vomiting/anxiety/sleep   If you develop nausea and vomiting that is not controlled by your nausea medication, call the clinic.   BELOW ARE SYMPTOMS THAT SHOULD BE REPORTED IMMEDIATELY:  *FEVER GREATER THAN 100.5 F  *CHILLS WITH OR WITHOUT FEVER  NAUSEA AND VOMITING THAT IS NOT CONTROLLED WITH YOUR NAUSEA MEDICATION  *UNUSUAL SHORTNESS OF BREATH  *UNUSUAL BRUISING OR BLEEDING  TENDERNESS IN MOUTH AND THROAT WITH OR WITHOUT PRESENCE OF ULCERS  *URINARY PROBLEMS  *BOWEL PROBLEMS  UNUSUAL RASH Items with * indicate a potential emergency and should be followed up as soon as possible.  Feel free to call the clinic should you have any questions or concerns. The clinic phone number is [phone].  Please show the [facility] at check-in to the Emergency Department and triage nurse.

## 2018-04-11 ENCOUNTER — Inpatient Hospital Stay: Payer: 59

## 2018-04-11 ENCOUNTER — Telehealth: Payer: Self-pay

## 2018-04-11 VITALS — BP 139/69 | HR 52 | Temp 97.5°F | Resp 16

## 2018-04-11 DIAGNOSIS — C880 Waldenstrom macroglobulinemia: Secondary | ICD-10-CM

## 2018-04-11 DIAGNOSIS — Z5112 Encounter for antineoplastic immunotherapy: Secondary | ICD-10-CM | POA: Diagnosis not present

## 2018-04-11 MED ORDER — SODIUM CHLORIDE 0.9 % IV SOLN
Freq: Once | INTRAVENOUS | Status: AC
  Administered 2018-04-11: 09:00:00 via INTRAVENOUS

## 2018-04-11 MED ORDER — SODIUM CHLORIDE 0.9 % IV SOLN
90.0000 mg/m2 | Freq: Once | INTRAVENOUS | Status: AC
  Administered 2018-04-11: 175 mg via INTRAVENOUS
  Filled 2018-04-11: qty 7

## 2018-04-11 MED ORDER — DEXAMETHASONE SODIUM PHOSPHATE 10 MG/ML IJ SOLN
10.0000 mg | Freq: Once | INTRAMUSCULAR | Status: AC
  Administered 2018-04-11: 10 mg via INTRAVENOUS

## 2018-04-11 MED ORDER — DEXAMETHASONE SODIUM PHOSPHATE 10 MG/ML IJ SOLN
INTRAMUSCULAR | Status: AC
  Filled 2018-04-11: qty 1

## 2018-04-11 NOTE — Telephone Encounter (Signed)
Spoke with pt while in office today for Day 2 of treatment. Pt reported one episode of nausea and "dry heaves" last pm, but resolved with anti-emetics. Reviewed how to use anti-emetics with patient and wife who each displayed good understanding. Patient was able to eat normally this morning and last pm. Pushing oral hydration. Denies other sx including bowel changes, fatigue (beyond baseline), alterations in appetite, SOB. Aware of how to reach office and on call MD as needed. dph

## 2018-04-11 NOTE — Patient Instructions (Signed)

## 2018-04-16 ENCOUNTER — Other Ambulatory Visit: Payer: Self-pay | Admitting: Family

## 2018-04-17 ENCOUNTER — Encounter: Payer: Self-pay | Admitting: Adult Health

## 2018-04-28 ENCOUNTER — Telehealth: Payer: Self-pay | Admitting: Hematology & Oncology

## 2018-04-28 NOTE — Telephone Encounter (Signed)
Returned call to patient per voicemail. Could not leave a message due to voicemail box is full

## 2018-04-29 ENCOUNTER — Other Ambulatory Visit: Payer: Self-pay | Admitting: Family

## 2018-04-30 ENCOUNTER — Other Ambulatory Visit: Payer: Self-pay | Admitting: Family

## 2018-05-08 ENCOUNTER — Inpatient Hospital Stay: Payer: 59

## 2018-05-08 ENCOUNTER — Inpatient Hospital Stay (HOSPITAL_BASED_OUTPATIENT_CLINIC_OR_DEPARTMENT_OTHER): Payer: 59 | Admitting: Hematology & Oncology

## 2018-05-08 ENCOUNTER — Inpatient Hospital Stay: Payer: 59 | Attending: Hematology & Oncology

## 2018-05-08 ENCOUNTER — Other Ambulatory Visit: Payer: Self-pay

## 2018-05-08 ENCOUNTER — Encounter: Payer: Self-pay | Admitting: Hematology & Oncology

## 2018-05-08 VITALS — BP 119/71 | HR 53 | Temp 97.9°F | Resp 19 | Wt 168.0 lb

## 2018-05-08 VITALS — BP 115/65 | HR 50 | Temp 97.5°F | Resp 17

## 2018-05-08 DIAGNOSIS — C880 Waldenstrom macroglobulinemia: Secondary | ICD-10-CM

## 2018-05-08 DIAGNOSIS — Z5112 Encounter for antineoplastic immunotherapy: Secondary | ICD-10-CM | POA: Diagnosis present

## 2018-05-08 DIAGNOSIS — Z5111 Encounter for antineoplastic chemotherapy: Secondary | ICD-10-CM | POA: Insufficient documentation

## 2018-05-08 DIAGNOSIS — D500 Iron deficiency anemia secondary to blood loss (chronic): Secondary | ICD-10-CM

## 2018-05-08 DIAGNOSIS — D509 Iron deficiency anemia, unspecified: Secondary | ICD-10-CM | POA: Insufficient documentation

## 2018-05-08 LAB — CBC WITH DIFFERENTIAL (CANCER CENTER ONLY)
Basophils Absolute: 0.1 10*3/uL (ref 0.0–0.1)
Basophils Relative: 1 %
Eosinophils Absolute: 0.1 10*3/uL (ref 0.0–0.5)
Eosinophils Relative: 3 %
HEMATOCRIT: 40.3 % (ref 38.7–49.9)
HEMOGLOBIN: 13.6 g/dL (ref 13.0–17.1)
LYMPHS ABS: 0.7 10*3/uL — AB (ref 0.9–3.3)
Lymphocytes Relative: 17 %
MCH: 32.5 pg (ref 28.0–33.4)
MCHC: 33.7 g/dL (ref 32.0–35.9)
MCV: 96.4 fL (ref 82.0–98.0)
MONOS PCT: 19 %
Monocytes Absolute: 0.9 10*3/uL (ref 0.1–0.9)
NEUTROS ABS: 2.7 10*3/uL (ref 1.5–6.5)
NEUTROS PCT: 60 %
Platelet Count: 240 10*3/uL (ref 145–400)
RBC: 4.18 MIL/uL — ABNORMAL LOW (ref 4.20–5.70)
RDW: 13.2 % (ref 11.1–15.7)
WBC Count: 4.4 10*3/uL (ref 4.0–10.0)

## 2018-05-08 LAB — CMP (CANCER CENTER ONLY)
ALK PHOS: 90 U/L — AB (ref 26–84)
ALT: 29 U/L (ref 10–47)
ANION GAP: 12 (ref 5–15)
AST: 24 U/L (ref 11–38)
Albumin: 3.5 g/dL (ref 3.5–5.0)
BUN: 12 mg/dL (ref 7–22)
CHLORIDE: 103 mmol/L (ref 98–108)
CO2: 29 mmol/L (ref 18–33)
Calcium: 9.1 mg/dL (ref 8.0–10.3)
Creatinine: 1.1 mg/dL (ref 0.60–1.20)
Glucose, Bld: 93 mg/dL (ref 73–118)
POTASSIUM: 3.7 mmol/L (ref 3.3–4.7)
Sodium: 144 mmol/L (ref 128–145)
Total Bilirubin: 0.7 mg/dL (ref 0.2–1.6)
Total Protein: 7.2 g/dL (ref 6.4–8.1)

## 2018-05-08 LAB — FERRITIN: Ferritin: 413 ng/mL — ABNORMAL HIGH (ref 22–316)

## 2018-05-08 LAB — IRON AND TIBC
Iron: 94 ug/dL (ref 42–163)
SATURATION RATIOS: 39 % — AB (ref 42–163)
TIBC: 243 ug/dL (ref 202–409)
UIBC: 149 ug/dL

## 2018-05-08 LAB — LACTATE DEHYDROGENASE: LDH: 162 U/L (ref 125–245)

## 2018-05-08 MED ORDER — DIPHENHYDRAMINE HCL 25 MG PO CAPS
ORAL_CAPSULE | ORAL | Status: AC
  Filled 2018-05-08: qty 2

## 2018-05-08 MED ORDER — DEXAMETHASONE SODIUM PHOSPHATE 10 MG/ML IJ SOLN
INTRAMUSCULAR | Status: AC
Start: 2018-05-08 — End: ?
  Filled 2018-05-08: qty 1

## 2018-05-08 MED ORDER — SODIUM CHLORIDE 0.9 % IV SOLN
90.0000 mg/m2 | Freq: Once | INTRAVENOUS | Status: AC
  Administered 2018-05-08: 175 mg via INTRAVENOUS
  Filled 2018-05-08: qty 7

## 2018-05-08 MED ORDER — SODIUM CHLORIDE 0.9 % IV SOLN: 375.0000 mg/m2 | Freq: Once | INTRAVENOUS | Status: DC

## 2018-05-08 MED ORDER — DEXAMETHASONE SODIUM PHOSPHATE 10 MG/ML IJ SOLN
10.0000 mg | Freq: Once | INTRAMUSCULAR | Status: AC
  Administered 2018-05-08: 10 mg via INTRAVENOUS

## 2018-05-08 MED ORDER — SODIUM CHLORIDE 0.9 % IV SOLN
Freq: Once | INTRAVENOUS | Status: AC
Start: 2018-05-08 — End: 2018-05-08
  Administered 2018-05-08: 12:00:00 via INTRAVENOUS

## 2018-05-08 MED ORDER — SODIUM CHLORIDE 0.9 % IV SOLN
375.0000 mg/m2 | Freq: Once | INTRAVENOUS | Status: AC
  Administered 2018-05-08: 700 mg via INTRAVENOUS
  Filled 2018-05-08: qty 70

## 2018-05-08 MED ORDER — ACETAMINOPHEN 325 MG PO TABS
650.0000 mg | ORAL_TABLET | Freq: Once | ORAL | Status: AC
Start: 2018-05-08 — End: 2018-05-08
  Administered 2018-05-08: 650 mg via ORAL

## 2018-05-08 MED ORDER — PALONOSETRON HCL INJECTION 0.25 MG/5ML
0.2500 mg | Freq: Once | INTRAVENOUS | Status: AC
  Administered 2018-05-08: 0.25 mg via INTRAVENOUS

## 2018-05-08 MED ORDER — ACETAMINOPHEN 325 MG PO TABS
ORAL_TABLET | ORAL | Status: AC
  Filled 2018-05-08: qty 2

## 2018-05-08 MED ORDER — PALONOSETRON HCL INJECTION 0.25 MG/5ML
INTRAVENOUS | Status: AC
  Filled 2018-05-08: qty 5

## 2018-05-08 MED ORDER — DIPHENHYDRAMINE HCL 25 MG PO CAPS
50.0000 mg | ORAL_CAPSULE | Freq: Once | ORAL | Status: AC
  Administered 2018-05-08: 50 mg via ORAL

## 2018-05-08 NOTE — Patient Instructions (Signed)
Diphenhydramine capsules or tablets What is this medicine? DIPHENHYDRAMINE (dye fen HYE dra meen) is an antihistamine. It is used to treat the symptoms of an allergic reaction. It is also used to treat Parkinson's disease. This medicine is also used to prevent and to treat motion sickness and as a nighttime sleep aid. This medicine may be used for other purposes; ask your health care provider or pharmacist if you have questions. COMMON BRAND NAME(S): Alka-Seltzer Plus Allergy, Aller-G-Time, Banophen, Benadryl Allergy, Benadryl Allergy Dye Free, Benadryl Allergy Kapgel, Benadryl Allergy Ultratab, Diphedryl, Diphenhist, Genahist, PHARBEDRYL, Q-Dryl, Gretta Began, Valu-Dryl, Vicks ZzzQuil Nightime Sleep-Aid What should I tell my health care provider before I take this medicine? They need to know if you have any of these conditions: -asthma or lung disease -glaucoma -high blood pressure or heart disease -liver disease -pain or difficulty passing urine -prostate trouble -ulcers or other stomach problems -an unusual or allergic reaction to diphenhydramine, other medicines foods, dyes, or preservatives such as sulfites -pregnant or trying to get pregnant -breast-feeding How should I use this medicine? Take this medicine by mouth with a full glass of water. Follow the directions on the prescription label. Take your doses at regular intervals. Do not take your medicine more often than directed. To prevent motion sickness start taking this medicine 30 to 60 minutes before you leave. Talk to your pediatrician regarding the use of this medicine in children. Special care may be needed. Patients over 41 years old may have a stronger reaction and need a smaller dose. Overdosage: If you think you have taken too much of this medicine contact a poison control center or emergency room at once. NOTE: This medicine is only for you. Do not share this medicine with others. What if I miss a dose? If you miss a dose,  take it as soon as you can. If it is almost time for your next dose, take only that dose. Do not take double or extra doses. What may interact with this medicine? Do not take this medicine with any of the following medications: -MAOIs like Carbex, Eldepryl, Marplan, Nardil, and Parnate This medicine may also interact with the following medications: -alcohol -barbiturates, like phenobarbital -medicines for bladder spasm like oxybutynin, tolterodine -medicines for blood pressure -medicines for depression, anxiety, or psychotic disturbances -medicines for movement abnormalities or Parkinson's disease -medicines for sleep -other medicines for cold, cough or allergy -some medicines for the stomach like chlordiazepoxide, dicyclomine This list may not describe all possible interactions. Give your health care provider a list of all the medicines, herbs, non-prescription drugs, or dietary supplements you use. Also tell them if you smoke, drink alcohol, or use illegal drugs. Some items may interact with your medicine. What should I watch for while using this medicine? Visit your doctor or health care professional for regular check ups. Tell your doctor if your symptoms do not improve or if they get worse. Your mouth may get dry. Chewing sugarless gum or sucking hard candy, and drinking plenty of water may help. Contact your doctor if the problem does not go away or is severe. This medicine may cause dry eyes and blurred vision. If you wear contact lenses you may feel some discomfort. Lubricating drops may help. See your eye doctor if the problem does not go away or is severe. You may get drowsy or dizzy. Do not drive, use machinery, or do anything that needs mental alertness until you know how this medicine affects you. Do not stand or sit up quickly,  especially if you are an older patient. This reduces the risk of dizzy or fainting spells. Alcohol may interfere with the effect of this medicine. Avoid  alcoholic drinks. What side effects may I notice from receiving this medicine? Side effects that you should report to your doctor or health care professional as soon as possible: -allergic reactions like skin rash, itching or hives, swelling of the face, lips, or tongue -changes in vision -confused, agitated, nervous -irregular or fast heartbeat -tremor -trouble passing urine -unusual bleeding or bruising -unusually weak or tired Side effects that usually do not require medical attention (report to your doctor or health care professional if they continue or are bothersome): -constipation, diarrhea -drowsy -headache -loss of appetite -stomach upset, vomiting -thick mucous This list may not describe all possible side effects. Call your doctor for medical advice about side effects. You may report side effects to FDA at 1-800-FDA-1088. Where should I keep my medicine? Keep out of the reach of children. Store at room temperature between 15 and 30 degrees C (59 and 86 degrees F). Keep container closed tightly. Throw away any unused medicine after the expiration date. NOTE: This sheet is a summary. It may not cover all possible information. If you have questions about this medicine, talk to your doctor, pharmacist, or health care provider.  2018 Elsevier/Gold Standard (2008-03-01 17:06:22) Acetaminophen tablets or caplets What is this medicine? ACETAMINOPHEN (a set a MEE noe fen) is a pain reliever. It is used to treat mild pain and fever. This medicine may be used for other purposes; ask your health care provider or pharmacist if you have questions. COMMON BRAND NAME(S): Aceta, Actamin, Anacin Aspirin Free, Genapap, Genebs, Mapap, Pain & Fever, Pain and Fever, PAIN RELIEF, PAIN RELIEF Extra Strength, Pain Reliever, Panadol, PHARBETOL, Q-Pap, Q-Pap Extra Strength, Tylenol, Tylenol CrushableTablet, Tylenol Extra Strength, XS No Aspirin, XS Pain Reliever What should I tell my health care  provider before I take this medicine? They need to know if you have any of these conditions: -if you often drink alcohol -liver disease -an unusual or allergic reaction to acetaminophen, other medicines, foods, dyes, or preservatives -pregnant or trying to get pregnant -breast-feeding How should I use this medicine? Take this medicine by mouth with a glass of water. Follow the directions on the package or prescription label. Take your medicine at regular intervals. Do not take your medicine more often than directed. Talk to your pediatrician regarding the use of this medicine in children. While this drug may be prescribed for children as young as 57 years of age for selected conditions, precautions do apply. Overdosage: If you think you have taken too much of this medicine contact a poison control center or emergency room at once. NOTE: This medicine is only for you. Do not share this medicine with others. What if I miss a dose? If you miss a dose, take it as soon as you can. If it is almost time for your next dose, take only that dose. Do not take double or extra doses. What may interact with this medicine? -alcohol -imatinib -isoniazid -other medicines with acetaminophen This list may not describe all possible interactions. Give your health care provider a list of all the medicines, herbs, non-prescription drugs, or dietary supplements you use. Also tell them if you smoke, drink alcohol, or use illegal drugs. Some items may interact with your medicine. What should I watch for while using this medicine? Tell your doctor or health care professional if the pain lasts more  than 10 days (5 days for children), if it gets worse, or if there is a new or different kind of pain. Also, check with your doctor if a fever lasts for more than 3 days. Do not take other medicines that contain acetaminophen with this medicine. Always read labels carefully. If you have questions, ask your doctor or  pharmacist. If you take too much acetaminophen get medical help right away. Too much acetaminophen can be very dangerous and cause liver damage. Even if you do not have symptoms, it is important to get help right away. What side effects may I notice from receiving this medicine? Side effects that you should report to your doctor or health care professional as soon as possible: -allergic reactions like skin rash, itching or hives, swelling of the face, lips, or tongue -breathing problems -fever or sore throat -redness, blistering, peeling or loosening of the skin, including inside the mouth -trouble passing urine or change in the amount of urine -unusual bleeding or bruising -unusually weak or tired -yellowing of the eyes or skin Side effects that usually do not require medical attention (report to your doctor or health care professional if they continue or are bothersome): -headache -nausea, stomach upset This list may not describe all possible side effects. Call your doctor for medical advice about side effects. You may report side effects to FDA at 1-800-FDA-1088. Where should I keep my medicine? Keep out of reach of children. Store at room temperature between 20 and 25 degrees C (68 and 77 degrees F). Protect from moisture and heat. Throw away any unused medicine after the expiration date. NOTE: This sheet is a summary. It may not cover all possible information. If you have questions about this medicine, talk to your doctor, pharmacist, or health care provider.  2018 Else[employer]/Gold Standard (2013-07-06 12:54:16) Dexamethasone injection What is this medicine? DEXAMETHASONE (dex a METH a sone) is a corticosteroid. It is used to treat inflammation of the skin, joints, lungs, and other organs. Common conditions treated include asthma, allergies, and arthritis. It is also used for other conditions, like blood disorders and diseases of the adrenal glands. This medicine may be used for other  purposes; ask your health care provider or pharmacist if you have questions. COMMON BRAND NAME(S): Decadron, DoubleDex, Simplist Dexamethasone, Solurex What should I tell my health care provider before I take this medicine? They need to know if you have any of these conditions: -blood clotting problems -Cushing's syndrome -diabetes -glaucoma -heart problems or disease -high blood pressure -infection like herpes, measles, tuberculosis, or chickenpox -kidney disease -liver disease -mental problems -myasthenia gravis -osteoporosis -previous heart attack -seizures -stomach, ulcer or intestine disease including colitis and diverticulitis -thyroid problem -an unusual or allergic reaction to dexamethasone, corticosteroids, other medicines, lactose, foods, dyes, or preservatives -pregnant or trying to get pregnant -breast-feeding How should I use this medicine? This medicine is for injection into a muscle, joint, lesion, soft tissue, or vein. It is given by a health care professional in a hospital or clinic setting. Talk to your pediatrician regarding the use of this medicine in children. Special care may be needed. Overdosage: If you think you have taken too much of this medicine contact a poison control center or emergency room at once. NOTE: This medicine is only for you. Do not share this medicine with others. What if I miss a dose? This may not apply. If you are having a series of injections over a prolonged period, try not to miss an appointment. Call  your doctor or health care professional to reschedule if you are unable to keep an appointment. What may interact with this medicine? Do not take this medicine with any of the following medications: -mifepristone, RU-486 -vaccines This medicine may also interact with the following medications: -amphotericin B -antibiotics like clarithromycin, erythromycin, and troleandomycin -aspirin and aspirin-like drugs -barbiturates like  phenobarbital -carbamazepine -cholestyramine -cholinesterase inhibitors like donepezil, galantamine, rivastigmine, and tacrine -cyclosporine -digoxin -diuretics -ephedrine -male hormones, like estrogens or progestins and birth control pills -indinavir -isoniazid -ketoconazole -medicines for diabetes -medicines that improve muscle tone or strength for conditions like myasthenia gravis -NSAIDs, medicines for pain and inflammation, like ibuprofen or naproxen -phenytoin -rifampin -thalidomide -warfarin This list may not describe all possible interactions. Give your health care provider a list of all the medicines, herbs, non-prescription drugs, or dietary supplements you use. Also tell them if you smoke, drink alcohol, or use illegal drugs. Some items may interact with your medicine. What should I watch for while using this medicine? Your condition will be monitored carefully while you are receiving this medicine. If you are taking this medicine for a long time, carry an identification card with your name and address, the type and dose of your medicine, and your doctor's name and address. This medicine may increase your risk of getting an infection. Stay away from people who are sick. Tell your doctor or health care professional if you are around anyone with measles or chickenpox. Talk to your health care provider before you get any vaccines that you take this medicine. If you are going to have surgery, tell your doctor or health care professional that you have taken this medicine within the last twelve months. Ask your doctor or health care professional about your diet. You may need to lower the amount of salt you eat. The medicine can increase your blood sugar. If you are a diabetic check with your doctor if you need help adjusting the dose of your diabetic medicine. What side effects may I notice from receiving this medicine? Side effects that you should report to your doctor or health  care professional as soon as possible: -allergic reactions like skin rash, itching or hives, swelling of the face, lips, or tongue -black or tarry stools -change in the amount of urine -changes in vision -confusion, excitement, restlessness, a false sense of well-being -fever, sore throat, sneezing, cough, or other signs of infection, wounds that will not heal -hallucinations -increased thirst -mental depression, mood swings, mistaken feelings of self importance or of being mistreated -pain in hips, back, ribs, arms, shoulders, or legs -pain, redness, or irritation at the injection site -redness, blistering, peeling or loosening of the skin, including inside the mouth -rounding out of face -swelling of feet or lower legs -unusual bleeding or bruising -unusual tired or weak -wounds that do not heal Side effects that usually do not require medical attention (report to your doctor or health care professional if they continue or are bothersome): -diarrhea or constipation -change in taste -headache -nausea, vomiting -skin problems, acne, thin and shiny skin -touble sleeping -unusual growth of hair on the face or body -weight gain This list may not describe all possible side effects. Call your doctor for medical advice about side effects. You may report side effects to FDA at 1-800-FDA-1088. Where should I keep my medicine? This drug is given in a hospital or clinic and will not be stored at home. NOTE: This sheet is a summary. It may not cover all possible information. If  you have questions about this medicine, talk to your doctor, pharmacist, or health care provider.  2018 Elsevier/Gold Standard (2008-03-04 14:04:12) Palonosetron Injection What is this medicine? PALONOSETRON (pal oh NOE se tron) is used to prevent nausea and vomiting caused by chemotherapy. It also helps prevent delayed nausea and vomiting that may occur a few days after your treatment. This medicine may be used for  other purposes; ask your health care provider or pharmacist if you have questions. COMMON BRAND NAME(S): Aloxi What should I tell my health care provider before I take this medicine? They need to know if you have any of these conditions: -an unusual or allergic reaction to palonosetron, dolasetron, granisetron, ondansetron, other medicines, foods, dyes, or preservatives -pregnant or trying to get pregnant -breast-feeding How should I use this medicine? This medicine is for infusion into a vein. It is given by a health care professional in a hospital or clinic setting. Talk to your pediatrician regarding the use of this medicine in children. While this drug may be prescribed for children as young as 1 month for selected conditions, precautions do apply. Overdosage: If you think you have taken too much of this medicine contact a poison control center or emergency room at once. NOTE: This medicine is only for you. Do not share this medicine with others. What if I miss a dose? This does not apply. What may interact with this medicine? -certain medicines for depression, anxiety, or psychotic disturbances -fentanyl -linezolid -MAOIs like Carbex, Eldepryl, Marplan, Nardil, and Parnate -methylene blue (injected into a vein) -tramadol This list may not describe all possible interactions. Give your health care provider a list of all the medicines, herbs, non-prescription drugs, or dietary supplements you use. Also tell them if you smoke, drink alcohol, or use illegal drugs. Some items may interact with your medicine. What should I watch for while using this medicine? Your condition will be monitored carefully while you are receiving this medicine. What side effects may I notice from receiving this medicine? Side effects that you should report to your doctor or health care professional as soon as possible: -allergic reactions like skin rash, itching or hives, swelling of the face, lips, or  tongue -breathing problems -confusion -dizziness -fast, irregular heartbeat -fever and chills -loss of balance or coordination -seizures -sweating -swelling of the hands and feet -tremors -unusually weak or tired Side effects that usually do not require medical attention (report to your doctor or health care professional if they continue or are bothersome): -constipation or diarrhea -headache This list may not describe all possible side effects. Call your doctor for medical advice about side effects. You may report side effects to FDA at 1-800-FDA-1088. Where should I keep my medicine? This drug is given in a hospital or clinic and will not be stored at home. NOTE: This sheet is a summary. It may not cover all possible information. If you have questions about this medicine, talk to your doctor, pharmacist, or health care provider.  2018 Else[employer]/Gold Standard (2013-09-18 10:38:36) Bendamustine Injection What is this medicine? BENDAMUSTINE (BEN da MUS teen) is a chemotherapy drug. It is used to treat chronic lymphocytic leukemia and non-Hodgkin lymphoma. This medicine may be used for other purposes; ask your health care provider or pharmacist if you have questions. COMMON BRAND NAME(S): BENDEKA, Treanda What should I tell my health care provider before I take this medicine? They need to know if you have any of these conditions: -infection (especially a virus infection such as chickenpox, cold sores,  or herpes) -kidney disease -liver disease -an unusual or allergic reaction to bendamustine, mannitol, other medicines, foods, dyes, or preservatives -pregnant or trying to get pregnant -breast-feeding How should I use this medicine? This medicine is for infusion into a vein. It is given by a health care professional in a hospital or clinic setting. Talk to your pediatrician regarding the use of this medicine in children. Special care may be needed. Overdosage: If you think you have  taken too much of this medicine contact a poison control center or emergency room at once. NOTE: This medicine is only for you. Do not share this medicine with others. What if I miss a dose? It is important not to miss your dose. Call your doctor or health care professional if you are unable to keep an appointment. What may interact with this medicine? Do not take this medicine with any of the following medications: -clozapine This medicine may also interact with the following medications: -atazanavir -cimetidine -ciprofloxacin -enoxacin -fluvoxamine -medicines for seizures like carbamazepine and phenobarbital -mexiletine -rifampin -tacrine -thiabendazole -zileuton This list may not describe all possible interactions. Give your health care provider a list of all the medicines, herbs, non-prescription drugs, or dietary supplements you use. Also tell them if you smoke, drink alcohol, or use illegal drugs. Some items may interact with your medicine. What should I watch for while using this medicine? This drug may make you feel generally unwell. This is not uncommon, as chemotherapy can affect healthy cells as well as cancer cells. Report any side effects. Continue your course of treatment even though you feel ill unless your doctor tells you to stop. You may need blood work done while you are taking this medicine. Call your doctor or health care professional for advice if you get a fever, chills or sore throat, or other symptoms of a cold or flu. Do not treat yourself. This drug decreases your body's ability to fight infections. Try to avoid being around people who are sick. This medicine may increase your risk to bruise or bleed. Call your doctor or health care professional if you notice any unusual bleeding. Talk to your doctor about your risk of cancer. You may be more at risk for certain types of cancers if you take this medicine. Do not become pregnant while taking this medicine or for 3  months after stopping it. Women should inform their doctor if they wish to become pregnant or think they might be pregnant. Men should not father a child while taking this medicine and for 3 months after stopping it.There is a potential for serious side effects to an unborn child. Talk to your health care professional or pharmacist for more information. Do not breast-feed an infant while taking this medicine. This medicine may interfere with the ability to have a child. You should talk with your doctor or health care professional if you are concerned about your fertility. What side effects may I notice from receiving this medicine? Side effects that you should report to your doctor or health care professional as soon as possible: -allergic reactions like skin rash, itching or hives, swelling of the face, lips, or tongue -low blood counts - this medicine may decrease the number of white blood cells, red blood cells and platelets. You may be at increased risk for infections and bleeding. -redness, blistering, peeling or loosening of the skin, including inside the mouth -signs of infection - fever or chills, cough, sore throat, pain or difficulty passing urine -signs of decreased platelets  or bleeding - bruising, pinpoint red spots on the skin, black, tarry stools, blood in the urine -signs of decreased red blood cells - unusually weak or tired, fainting spells, lightheadedness -signs and symptoms of kidney injury like trouble passing urine or change in the amount of urine -signs and symptoms of liver injury like dark yellow or Lorman urine; general ill feeling or flu-like symptoms; light-colored stools; loss of appetite; nausea; right upper belly pain; unusually weak or tired; yellowing of the eyes or skin Side effects that usually do not require medical attention (report to your doctor or health care professional if they continue or are bothersome): -constipation -decreased  appetite -diarrhea -headache -mouth sores -nausea/vomiting -tiredness This list may not describe all possible side effects. Call your doctor for medical advice about side effects. You may report side effects to FDA at 1-800-FDA-1088. Where should I keep my medicine? This drug is given in a hospital or clinic and will not be stored at home. NOTE: This sheet is a summary. It may not cover all possible information. If you have questions about this medicine, talk to your doctor, pharmacist, or health care provider.  2018 [employer]/Gold Standard (2015-09-15 08:45:41) Rituximab injection What is this medicine? RITUXIMAB (ri TUX i mab) is a monoclonal antibody. It is used to treat certain types of cancer like non-Hodgkin lymphoma and chronic lymphocytic leukemia. It is also used to treat rheumatoid arthritis, granulomatosis with polyangiitis (or Wegener's granulomatosis), and microscopic polyangiitis. This medicine may be used for other purposes; ask your health care provider or pharmacist if you have questions. COMMON BRAND NAME(S): Rituxan What should I tell my health care provider before I take this medicine? They need to know if you have any of these conditions: -heart disease -infection (especially a virus infection such as hepatitis B, chickenpox, cold sores, or herpes) -immune system problems -irregular heartbeat -kidney disease -lung or breathing disease, like asthma -recently received or scheduled to receive a vaccine -an unusual or allergic reaction to rituximab, mouse proteins, other medicines, foods, dyes, or preservatives -pregnant or trying to get pregnant -breast-feeding How should I use this medicine? This medicine is for infusion into a vein. It is administered in a hospital or clinic by a specially trained health care professional. A special MedGuide will be given to you by the pharmacist with each prescription and refill. Be sure to read this information carefully each  time. Talk to your pediatrician regarding the use of this medicine in children. This medicine is not approved for use in children. Overdosage: If you think you have taken too much of this medicine contact a poison control center or emergency room at once. NOTE: This medicine is only for you. Do not share this medicine with others. What if I miss a dose? It is important not to miss a dose. Call your doctor or health care professional if you are unable to keep an appointment. What may interact with this medicine? -cisplatin -other medicines for arthritis like disease modifying antirheumatic drugs or tumor necrosis factor inhibitors -live virus vaccines This list may not describe all possible interactions. Give your health care provider a list of all the medicines, herbs, non-prescription drugs, or dietary supplements you use. Also tell them if you smoke, drink alcohol, or use illegal drugs. Some items may interact with your medicine. What should I watch for while using this medicine? Your condition will be monitored carefully while you are receiving this medicine. You may need blood work done while you are taking this  medicine. This medicine can cause serious allergic reactions. To reduce your risk you may need to take medicine before treatment with this medicine. Take your medicine as directed. In some patients, this medicine may cause a serious brain infection that may cause death. If you have any problems seeing, thinking, speaking, walking, or standing, tell your doctor right away. If you cannot reach your doctor, urgently seek other source of medical care. Call your doctor or health care professional for advice if you get a fever, chills or sore throat, or other symptoms of a cold or flu. Do not treat yourself. This drug decreases your body's ability to fight infections. Try to avoid being around people who are sick. Do not become pregnant while taking this medicine or for 12 months after stopping  it. Women should inform their doctor if they wish to become pregnant or think they might be pregnant. There is a potential for serious side effects to an unborn child. Talk to your health care professional or pharmacist for more information. What side effects may I notice from receiving this medicine? Side effects that you should report to your doctor or health care professional as soon as possible: -breathing problems -chest pain -dizziness or feeling faint -fast, irregular heartbeat -low blood counts - this medicine may decrease the number of white blood cells, red blood cells and platelets. You may be at increased risk for infections and bleeding. -mouth sores -redness, blistering, peeling or loosening of the skin, including inside the mouth (this can be added for any serious or exfoliative rash that could lead to hospitalization) -signs of infection - fever or chills, cough, sore throat, pain or difficulty passing urine -signs and symptoms of kidney injury like trouble passing urine or change in the amount of urine -signs and symptoms of liver injury like dark yellow or [place] urine; general ill feeling or flu-like symptoms; light-colored stools; loss of appetite; nausea; right upper belly pain; unusually weak or tired; yellowing of the eyes or skin -stomach pain -vomiting Side effects that usually do not require medical attention (report to your doctor or health care professional if they continue or are bothersome): -headache -joint pain -muscle cramps or muscle pain This list may not describe all possible side effects. Call your doctor for medical advice about side effects. You may report side effects to FDA at 1-800-FDA-1088. Where should I keep my medicine? This drug is given in a hospital or clinic and will not be stored at home. NOTE: This sheet is a summary. It may not cover all possible information. If you have questions about this medicine, talk to your doctor, pharmacist, or health  care provider.  2018 Elsevier/Gold Standard (2016-06-20 15:28:09)

## 2018-05-08 NOTE — Progress Notes (Signed)
Hematology and Oncology Follow Up Visit  Edwin Webster [id number] [birth year] 57 y.o. 05/08/2018   Principle Diagnosis:   Waldenstrom's macroglobulinemia  Iron deficiency anemia  Current Therapy:    Rituxan/bendamustine-s/p cycle #1   IV iron as indicated-last dose given on 02/2018     Interim History:  [name] is back for follow-up.  He actually tolerated his first cycle of chemotherapy fairly well.  He has had some nausea.  Much of the nausea is from the chemotherapy.  I told him to stop the famciclovir and we will see if this makes a difference.  He is still somewhat tired.  He did get iron when we last saw him.  His iron levels back in April showed a ferritin of 54 with an iron saturation of 20%.  We did do a PET scan on him back in May.  The PET scan would not show any obvious hypermetabolic adenopathy.  He is out of work right now.  We talked about him going back to work if he wished.  He thinks that it might be better for him to stay out of work right now.  He is eating okay.  He has had no issues with diarrhea.  He has had no rashes.  He has had no fever.  He has had no leg swelling.  Overall, his performance status is ECOG 1.  Medications:  Current Outpatient Medications:  .  aspirin EC 81 MG tablet, Take 81 mg daily by mouth., Disp: , Rfl:  .  atorvastatin (LIPITOR) 80 MG tablet, Take 1/2 to 1 tablet as directed for cholesterol (Patient taking differently: Take 40 mg by mouth daily. Take 1/2 to 1 tablet as directed for cholesterol), Disp: 90 tablet, Rfl: 3 .  bisoprolol (ZEBETA) 10 MG tablet, TAKE 1/2 TO 1 TABLET DAILY FOR BP (Patient taking differently: TAKE 1/2  tab daily 03/12/18), Disp: 90 tablet, Rfl: 1 .  Cholecalciferol (VITAMIN D-3) 5000 UNITS TABS, Take 10,000 Units by mouth daily. , Disp: , Rfl:  .  dexamethasone (DECADRON) 4 MG tablet, Take 2 tablets (8 mg total) by mouth daily. Start the day after bendamustine chemotherapy for 2 days. Take with food., Disp: 30  tablet, Rfl: 1 .  escitalopram (LEXAPRO) 20 MG tablet, Take 1 tablet (20 mg total) by mouth daily., Disp: 90 tablet, Rfl: 1 .  famciclovir (FAMVIR) 500 MG tablet, Take 1 tablet (500 mg total) by mouth daily., Disp: 30 tablet, Rfl: 8 .  famotidine (PEPCID) 40 MG tablet, Take 1/2-1 tab with ibuprofen as needed., Disp: 90 tablet, Rfl: 1 .  fluticasone (FLONASE) 50 MCG/ACT nasal spray, PLACE 2 SPRAYS INTO BOTH NOSTRILS DAILY. (Patient taking differently: PLACE 2 SPRAYS INTO BOTH NOSTRILS DAILY.  Takes prn.), Disp: 16 g, Rfl: 2 .  folic acid (FOLVITE) 1 MG tablet, Take 1 mg daily by mouth., Disp: , Rfl:  .  ibuprofen (ADVIL,MOTRIN) 800 MG tablet, Take 1 tablet (800 mg total) by mouth daily as needed., Disp: 90 tablet, Rfl: 1 .  levocetirizine (XYZAL) 5 MG tablet, TAKE 1 TABLET (5 MG TOTAL) BY MOUTH EVERY EVENING., Disp: 90 tablet, Rfl: 3 .  LORazepam (ATIVAN) 0.5 MG tablet, Take 1 tablet (0.5 mg total) by mouth every 6 (six) hours as needed (Nausea or vomiting)., Disp: 30 tablet, Rfl: 0 .  ondansetron (ZOFRAN) 8 MG tablet, Take 1 tablet (8 mg total) by mouth 2 (two) times daily as needed for refractory nausea / vomiting. Start on day 2 after bendamustine., Disp:  30 tablet, Rfl: 1 .  prochlorperazine (COMPAZINE) 10 MG tablet, Take 1 tablet (10 mg total) by mouth every 6 (six) hours as needed (Nausea or vomiting)., Disp: 30 tablet, Rfl: 1 .  valACYclovir (VALTREX) 1000 MG tablet, Take 1 tablet (1,000 mg total) by mouth 3 (three) times daily. Take for 7 days, Disp: 21 tablet, Rfl: 0  Allergies:  Allergies  Allergen Reactions  . Humira [Adalimumab] Other (See Comments)    "blacked out", confused    Past Medical History, Surgical history, Social history, and Family History were reviewed and updated.  Review of Systems: Review of Systems  Constitutional: Positive for fatigue.  HENT:  Negative.   Eyes: Negative.   Respiratory: Positive for shortness of breath.   Cardiovascular: Negative.     Gastrointestinal: Negative.   Endocrine: Negative.   Genitourinary: Negative.    Musculoskeletal: Positive for arthralgias and myalgias.  Skin: Negative.   Neurological: Positive for headaches.  Hematological: Negative.   Psychiatric/Behavioral: Negative.     Physical Exam:  weight is 168 lb (76.2 kg). His oral temperature is 97.9 F (36.6 C). His blood pressure is 119/71 and his pulse is 53 (abnormal). His respiration is 19 and oxygen saturation is 100%.   Wt Readings from Last 3 Encounters:  05/08/18 168 lb (76.2 kg)  03/28/18 171 lb (77.6 kg)  03/25/18 171 lb (77.6 kg)    Physical Exam  Constitutional: He is oriented to person, place, and time.  HENT:  Head: Normocephalic and atraumatic.  Mouth/Throat: Oropharynx is clear and moist.  Eyes: Pupils are equal, round, and reactive to light. EOM are normal.  Neck: Normal range of motion.  Cardiovascular: Normal rate, regular rhythm and normal heart sounds.  Pulmonary/Chest: Effort normal and breath sounds normal.  Abdominal: Soft. Bowel sounds are normal.  Musculoskeletal: Normal range of motion. He exhibits no edema, tenderness or deformity.  Lymphadenopathy:    He has no cervical adenopathy.  Neurological: He is alert and oriented to person, place, and time.  Skin: Skin is warm and dry. No rash noted. No erythema.  Psychiatric: He has a normal mood and affect. His behavior is normal. Judgment and thought content normal.  Vitals reviewed.    Lab Results  Component Value Date   WBC 4.4 05/08/2018   HGB 13.6 05/08/2018   HCT 40.3 05/08/2018   MCV 96.4 05/08/2018   PLT 240 05/08/2018     Chemistry      Component Value Date/Time   NA 144 05/08/2018 1009   K 3.7 05/08/2018 1009   CL 103 05/08/2018 1009   CO2 29 05/08/2018 1009   BUN 12 05/08/2018 1009   CREATININE 1.10 05/08/2018 1009   CREATININE 1.33 02/24/2018 1650      Component Value Date/Time   CALCIUM 9.1 05/08/2018 1009   ALKPHOS 90 (H) 05/08/2018 1009    AST 24 05/08/2018 1009   ALT 29 05/08/2018 1009   BILITOT 0.7 05/08/2018 1009       Impression and Plan: [name] is a 57 year old white male.  He has Waldenstrm's macroglobulinemia.  He is symptomatic despite the fact that his IgM level is not all that bad.  We will go ahead with his second cycle of treatment.  He will get a total of 6 cycles of therapy.  For right now, I do not see that we have to do any scans on him.  We will plan to get him back in another month for cycle #3.  [name],  MD 6/13/201911:16 AM

## 2018-05-09 ENCOUNTER — Inpatient Hospital Stay: Payer: 59

## 2018-05-09 VITALS — BP 138/60 | HR 79 | Temp 98.0°F

## 2018-05-09 DIAGNOSIS — Z5112 Encounter for antineoplastic immunotherapy: Secondary | ICD-10-CM | POA: Diagnosis not present

## 2018-05-09 DIAGNOSIS — C880 Waldenstrom macroglobulinemia: Secondary | ICD-10-CM

## 2018-05-09 LAB — IGG, IGA, IGM
IGM (IMMUNOGLOBULIN M), SRM: 1350 mg/dL — AB (ref 20–172)
IgA: 29 mg/dL — ABNORMAL LOW (ref 90–386)
IgG (Immunoglobin G), Serum: 486 mg/dL — ABNORMAL LOW (ref 700–1600)

## 2018-05-09 LAB — KAPPA/LAMBDA LIGHT CHAINS
KAPPA, LAMDA LIGHT CHAIN RATIO: 6.83 — AB (ref 0.26–1.65)
Kappa free light chain: 97 mg/L — ABNORMAL HIGH (ref 3.3–19.4)
Lambda free light chains: 14.2 mg/L (ref 5.7–26.3)

## 2018-05-09 LAB — BETA 2 MICROGLOBULIN, SERUM: BETA 2 MICROGLOBULIN: 2.1 mg/L (ref 0.6–2.4)

## 2018-05-09 MED ORDER — DEXAMETHASONE SODIUM PHOSPHATE 10 MG/ML IJ SOLN
INTRAMUSCULAR | Status: AC
  Filled 2018-05-09: qty 1

## 2018-05-09 MED ORDER — SODIUM CHLORIDE 0.9 % IV SOLN
90.0000 mg/m2 | Freq: Once | INTRAVENOUS | Status: AC
  Administered 2018-05-09: 175 mg via INTRAVENOUS
  Filled 2018-05-09: qty 7

## 2018-05-09 MED ORDER — DEXAMETHASONE SODIUM PHOSPHATE 10 MG/ML IJ SOLN
10.0000 mg | Freq: Once | INTRAMUSCULAR | Status: AC
  Administered 2018-05-09: 10 mg via INTRAVENOUS

## 2018-05-09 MED ORDER — SODIUM CHLORIDE 0.9 % IV SOLN
Freq: Once | INTRAVENOUS | Status: AC
  Administered 2018-05-09: 10:00:00 via INTRAVENOUS

## 2018-05-09 NOTE — Patient Instructions (Signed)

## 2018-05-12 ENCOUNTER — Telehealth: Payer: Self-pay | Admitting: *Deleted

## 2018-05-12 NOTE — Telephone Encounter (Signed)
Message left on Triage Line:  Patient is c/o blood from him penis. He states he's been constipated and noticed blood in the toilet which he thought was coming from his rectum. He states it is actually coming from his penis.   Attempted to call back patient at 9:50a. There was no answer and mailbox full. Will attempt again.   The bleeding started last PM. He noticed the water in the bowl was reddish. He assumed it was rectal, but then this morning, when he urinated he found it was from his urethra. He stated as he urinated, the blood cleared and his urine became a normal color. No symptoms of UTI.  Reviewed with Dr Marin Olp. He would like patient to observe the bleeding for now. If it continues to be a bright red, or he continues to bleed in between voids he is to call back.  Patient understands instructions. He understands his urine may be a pinkish color today. He will call back if the bright red bleeding returns. Also recommended the use of Miralax for constipation. He understood all instructions, confirmed with teach back.

## 2018-05-14 LAB — PROTEIN ELECTROPHORESIS, SERUM, WITH REFLEX
A/G Ratio: 1.2 (ref 0.7–1.7)
ALBUMIN ELP: 3.7 g/dL (ref 2.9–4.4)
ALPHA-1-GLOBULIN: 0.3 g/dL (ref 0.0–0.4)
Alpha-2-Globulin: 0.7 g/dL (ref 0.4–1.0)
BETA GLOBULIN: 0.8 g/dL (ref 0.7–1.3)
Gamma Globulin: 1.3 g/dL (ref 0.4–1.8)
Globulin, Total: 3.1 g/dL (ref 2.2–3.9)
M-Spike, %: 0.9 g/dL — ABNORMAL HIGH
SPEP INTERP: 0
Total Protein ELP: 6.8 g/dL (ref 6.0–8.5)

## 2018-05-14 LAB — IMMUNOFIXATION REFLEX, SERUM
IGG (IMMUNOGLOBIN G), SERUM: 478 mg/dL — AB (ref 700–1600)
IGM (IMMUNOGLOBULIN M), SRM: 1271 mg/dL — AB (ref 20–172)
IgA: 28 mg/dL — ABNORMAL LOW (ref 90–386)

## 2018-06-05 ENCOUNTER — Telehealth: Payer: Self-pay | Admitting: Pharmacist

## 2018-06-05 ENCOUNTER — Other Ambulatory Visit: Payer: Self-pay | Admitting: *Deleted

## 2018-06-05 ENCOUNTER — Other Ambulatory Visit: Payer: Self-pay

## 2018-06-05 ENCOUNTER — Inpatient Hospital Stay: Payer: 59 | Attending: Hematology & Oncology | Admitting: Family

## 2018-06-05 ENCOUNTER — Inpatient Hospital Stay: Payer: 59

## 2018-06-05 ENCOUNTER — Telehealth: Payer: Self-pay

## 2018-06-05 ENCOUNTER — Encounter: Payer: Self-pay | Admitting: Family

## 2018-06-05 VITALS — BP 124/64 | HR 58

## 2018-06-05 VITALS — BP 118/62 | HR 55 | Temp 97.7°F | Resp 18 | Wt 169.0 lb

## 2018-06-05 DIAGNOSIS — R42 Dizziness and giddiness: Secondary | ICD-10-CM

## 2018-06-05 DIAGNOSIS — D509 Iron deficiency anemia, unspecified: Secondary | ICD-10-CM | POA: Insufficient documentation

## 2018-06-05 DIAGNOSIS — D500 Iron deficiency anemia secondary to blood loss (chronic): Secondary | ICD-10-CM

## 2018-06-05 DIAGNOSIS — C880 Waldenstrom macroglobulinemia: Secondary | ICD-10-CM | POA: Diagnosis present

## 2018-06-05 LAB — CMP (CANCER CENTER ONLY)
ALBUMIN: 3.7 g/dL (ref 3.5–5.0)
ALT: 28 U/L (ref 10–47)
AST: 30 U/L (ref 11–38)
Alkaline Phosphatase: 77 U/L (ref 26–84)
Anion gap: 10 (ref 5–15)
BILIRUBIN TOTAL: 0.8 mg/dL (ref 0.2–1.6)
BUN: 14 mg/dL (ref 7–22)
CALCIUM: 9.3 mg/dL (ref 8.0–10.3)
CO2: 28 mmol/L (ref 18–33)
CREATININE: 1.2 mg/dL (ref 0.60–1.20)
Chloride: 103 mmol/L (ref 98–108)
Glucose, Bld: 85 mg/dL (ref 73–118)
Potassium: 3.6 mmol/L (ref 3.3–4.7)
Sodium: 141 mmol/L (ref 128–145)
Total Protein: 7 g/dL (ref 6.4–8.1)

## 2018-06-05 LAB — CBC WITH DIFFERENTIAL (CANCER CENTER ONLY)
BASOS ABS: 0 10*3/uL (ref 0.0–0.1)
BASOS PCT: 1 %
Eosinophils Absolute: 0.2 10*3/uL (ref 0.0–0.5)
Eosinophils Relative: 5 %
HCT: 37.1 % — ABNORMAL LOW (ref 38.7–49.9)
HEMOGLOBIN: 12.9 g/dL — AB (ref 13.0–17.1)
Lymphocytes Relative: 20 %
Lymphs Abs: 0.7 10*3/uL — ABNORMAL LOW (ref 0.9–3.3)
MCH: 33.2 pg (ref 28.0–33.4)
MCHC: 34.8 g/dL (ref 32.0–35.9)
MCV: 95.6 fL (ref 82.0–98.0)
MONO ABS: 0.8 10*3/uL (ref 0.1–0.9)
Monocytes Relative: 23 %
NEUTROS ABS: 1.7 10*3/uL (ref 1.5–6.5)
NEUTROS PCT: 51 %
Platelet Count: 252 10*3/uL (ref 145–400)
RBC: 3.88 MIL/uL — AB (ref 4.20–5.70)
RDW: 13.6 % (ref 11.1–15.7)
WBC: 3.3 10*3/uL — AB (ref 4.0–10.0)

## 2018-06-05 MED ORDER — MECLIZINE HCL 25 MG PO TABS: 25.0000 mg | ORAL_TABLET | Freq: Three times a day (TID) | ORAL | 2 refills | Status: DC | PRN

## 2018-06-05 MED ORDER — FERRIC CARBOXYMALTOSE 750 MG/15ML IV SOLN
750.0000 mg | Freq: Once | INTRAVENOUS | Status: AC
  Administered 2018-06-05: 750 mg via INTRAVENOUS
  Filled 2018-06-05: qty 15

## 2018-06-05 MED ORDER — IBRUTINIB 420 MG PO TABS: 420.0000 mg | ORAL_TABLET | Freq: Every day | ORAL | 6 refills | Status: DC

## 2018-06-05 MED ORDER — SODIUM CHLORIDE 0.9 % IV SOLN
Freq: Once | INTRAVENOUS | Status: AC
  Administered 2018-06-05: 12:00:00 via INTRAVENOUS

## 2018-06-05 NOTE — Telephone Encounter (Signed)
Oral Oncology Patient Advocate Encounter  Received notification from [place] that prior authorization for Ibrutinib is required.  PA submitted on CoverMyMeds Key: [id number] Status is pending  Oral Oncology Clinic will continue to follow.  Edwin Webster Patient [place] Phone [phone] Fax [phone]

## 2018-06-05 NOTE — Progress Notes (Signed)
Hematology and Oncology Follow Up Visit  Edwin Webster [id number] 1961-03-04 57 y.o. 06/05/2018   Principle Diagnosis:  Waldenstrom's macroglobulinemia Iron deficiency anemia  Current Therapy:   Rituxan/bendamustine-s/p cycle 2  IV iron as indicated - last received in April 2019   Interim History:  Edwin Webster is here today for follow-up and treatment. He had some phlebitis in the right arm after his last treatment. There is no redness are swelling at this time but the hand and forearm are still a little sore. His counts have not decreased with treatment as we had hoped. M-spike in June was 0.9 and IgM level was 1,350 mg/dL.  He is still having fatigue at times and will take breaks to rest as needed. His iron was improved but still low 2 weeks ago after he had received IV iron.  He has had occasional episodes of dizziness where it felt like the room was spinning. This sounds like vertigo. Thankfully he has had no falls or syncopal episodes.  No issue so far with infections. No fever, chills, n/v, cough, rash, SOB, chest pain, palpitations, abdominal pain or changes in bowel or bladder habits.  He is taking Mirilax daily for constipation. This has helped quite a bit. He has had no new episodes of bleeding when urinating. No other bleeding noted. No bruising or petechia.  No numbness or tingling in his extremities. No lymphadenopathy noted on exam.  He has maintained a good appetite and is staying well hydrated. His weight is stable.   ECOG Performance Status: 1 - Symptomatic but completely ambulatory  Medications:  Allergies as of 06/05/2018      Reactions   Humira [adalimumab] Other (See Comments)   "blacked out", confused      Medication List        Accurate as of 06/05/18 10:05 AM. Always use your most recent med list.          aspirin EC 81 MG tablet Take 81 mg daily by mouth.   atorvastatin 80 MG tablet Commonly known as:  LIPITOR Take 1/2 to 1 tablet as directed for  cholesterol   bisoprolol 10 MG tablet Commonly known as:  ZEBETA TAKE 1/2 TO 1 TABLET DAILY FOR BP   dexamethasone 4 MG tablet Commonly known as:  DECADRON Take 2 tablets (8 mg total) by mouth daily. Start the day after bendamustine chemotherapy for 2 days. Take with food.   escitalopram 20 MG tablet Commonly known as:  LEXAPRO Take 1 tablet (20 mg total) by mouth daily.   famciclovir 500 MG tablet Commonly known as:  FAMVIR Take 1 tablet (500 mg total) by mouth daily.   famotidine 40 MG tablet Commonly known as:  PEPCID Take 1/2-1 tab with ibuprofen as needed.   fluticasone 50 MCG/ACT nasal spray Commonly known as:  FLONASE PLACE 2 SPRAYS INTO BOTH NOSTRILS DAILY.   folic acid 1 MG tablet Commonly known as:  FOLVITE Take 1 mg daily by mouth.   ibuprofen 800 MG tablet Commonly known as:  MOTRIN IB Take 1 tablet (800 mg total) by mouth daily as needed.   levocetirizine 5 MG tablet Commonly known as:  XYZAL TAKE 1 TABLET (5 MG TOTAL) BY MOUTH EVERY EVENING.   LORazepam 0.5 MG tablet Commonly known as:  ATIVAN Take 1 tablet (0.5 mg total) by mouth every 6 (six) hours as needed (Nausea or vomiting).   ondansetron 8 MG tablet Commonly known as:  ZOFRAN Take 1 tablet (8 mg total) by mouth  2 (two) times daily as needed for refractory nausea / vomiting. Start on day 2 after bendamustine.   prochlorperazine 10 MG tablet Commonly known as:  COMPAZINE Take 1 tablet (10 mg total) by mouth every 6 (six) hours as needed (Nausea or vomiting).   valACYclovir 1000 MG tablet Commonly known as:  VALTREX Take 1 tablet (1,000 mg total) by mouth 3 (three) times daily. Take for 7 days   Vitamin D-3 5000 units Tabs Take 10,000 Units by mouth daily.       Allergies:  Allergies  Allergen Reactions  . Humira [Adalimumab] Other (See Comments)    "blacked out", confused    Past Medical History, Surgical history, Social history, and Family History were reviewed and  updated.  Review of Systems: All other 10 point review of systems is negative.   Physical Exam:  vitals were not taken for this visit.   Wt Readings from Last 3 Encounters:  05/08/18 168 lb (76.2 kg)  03/28/18 171 lb (77.6 kg)  03/25/18 171 lb (77.6 kg)    Ocular: Sclerae unicteric, pupils equal, round and reactive to light Ear-nose-throat: Oropharynx clear, dentition fair Lymphatic: No cervical, supraclavicular or axillary adenopathy Lungs no rales or rhonchi, good excursion bilaterally Heart regular rate and rhythm, no murmur appreciated Abd soft, nontender, positive bowel sounds, no liver or spleen tip palpated on exam, no fluid wave  MSK no focal spinal tenderness, no joint edema Neuro: non-focal, well-oriented, appropriate affect Breasts: Deferred   Lab Results  Component Value Date   WBC 3.3 (L) 06/05/2018   HGB 12.9 (L) 06/05/2018   HCT 37.1 (L) 06/05/2018   MCV 95.6 06/05/2018   PLT 252 06/05/2018   Lab Results  Component Value Date   FERRITIN 413 (H) 05/08/2018   IRON 94 05/08/2018   TIBC 243 05/08/2018   UIBC 149 05/08/2018   IRONPCTSAT 39 (L) 05/08/2018   Lab Results  Component Value Date   RETICCTPCT 0.9 02/12/2018   RBC 3.88 (L) 06/05/2018   RETICCTABS 33,120 02/12/2018   Lab Results  Component Value Date   KPAFRELGTCHN 97.0 (H) 05/08/2018   LAMBDASER 14.2 05/08/2018   KAPLAMBRATIO 6.83 (H) 05/08/2018   Lab Results  Component Value Date   IGGSERUM 478 (L) 05/08/2018   IGA 28 (L) 05/08/2018   IGMSERUM 1,271 (H) 05/08/2018   Lab Results  Component Value Date   TOTALPROTELP 6.8 05/08/2018   ALBUMINELP 3.7 05/08/2018   A1GS 0.3 05/08/2018   A2GS 0.7 05/08/2018   BETS 0.8 05/08/2018   BETA2SER 0.2 02/18/2018   GAMS 1.3 05/08/2018   MSPIKE 0.9 (H) 05/08/2018   SPEI  02/18/2018     Comment:     . Evaluation reveals a restricted band (M-spike)  migrating in the gamma globulin region.  Consider  immunofixation analysis if indicated. .       Chemistry      Component Value Date/Time   NA 144 05/08/2018 1009   K 3.7 05/08/2018 1009   CL 103 05/08/2018 1009   CO2 29 05/08/2018 1009   BUN 12 05/08/2018 1009   CREATININE 1.10 05/08/2018 1009   CREATININE 1.33 02/24/2018 1650      Component Value Date/Time   CALCIUM 9.1 05/08/2018 1009   ALKPHOS 90 (H) 05/08/2018 1009   AST 24 05/08/2018 1009   ALT 29 05/08/2018 1009   BILITOT 0.7 05/08/2018 1009      Impression and Plan: Edwin Webster is a very pleasant 57 yo caucasian gentleman with Waldenstrom's  macroglobulinemia. Unfortunately, his IgM and M-spike have not improved like we had hoped. \ This was a shared visit with Dr. Marin Olp. We will stop his treatment with Rituxan/Bendamustine and change him over to [place].  We will also give him Meclizine as needed for Vertigo.  We will hold off on doing scans for now.  We will plan to see him back in on 07/03/18 for follow-up and repeat lab work.  He was given IV iron today.  They will contact our office with any questions or concerns. We can certainly see him sooner if need be.   Laverna Peace, NP 7/11/201910:05 AM    ADDENDUM: I saw and examined Edwin Webster.  I think that we are going to have to switch treatment on him.  I think we are going to have to try Imbruvica.  He just is not getting the response that I would like with the Rituxan/bendamustine.  I think the IMBRUVICA would be a good idea.  I think he could tolerate this.  I do not see any reason why we could not try it.  I will start him on 420 mg.  This is the standard dose.  We will do this as a single agent therapy.  We will stop the Rituxan/bendamustine right now.  We spent about 40 minutes with he and his wife.  We spent all the time face-to-face with them.  We reviewed his laboratory studies.  We answered their questions.  I would like to see him back in about 6 weeks.  By then, he will have been on the [facility] for about a month.  Lattie Haw, MD

## 2018-06-05 NOTE — Telephone Encounter (Signed)
Oral Oncology Pharmacist Encounter  Received new prescription for Imbruvica (ibrutinib) for the treatment of Waldenstrom's macroglobulinemia, planned duration until disease progression or unacceptable drug toxicity.  CBC/CMP from 06/05/18 assessed, no relevant lab abnormalities. Prescription dose and frequency assessed.   Current medication list in Epic reviewed, a few DDIs with ibrutinib identified: - Ibrutinib may enhance the antiplatelet properties of aspirin and ibuprofen. Mr. Bunda should be encourage to report any bleeding and be monitored for bleeding.  Prescription has been e-scribed to the [facility] for benefits analysis and approval.  Oral Oncology Clinic will continue to follow for insurance authorization, copayment issues, initial counseling and start date.  Darl Pikes, PharmD, BCPS, [facility] Hematology/Oncology Clinical Pharmacist [facility] [place] [facility] [phone]  06/05/2018 3:03 PM

## 2018-06-05 NOTE — Patient Instructions (Signed)
Ferric carboxymaltose injection What is this medicine? FERRIC CARBOXYMALTOSE (ferr-ik car-box-ee-mol-toes) is an iron complex. Iron is used to make healthy red blood cells, which carry oxygen and nutrients throughout the body. This medicine is used to treat anemia in people with chronic kidney disease or people who cannot take iron by mouth. This medicine may be used for other purposes; ask your health care provider or pharmacist if you have questions. COMMON BRAND NAME(S): Injectafer What should I tell my health care provider before I take this medicine? They need to know if you have any of these conditions: -anemia not caused by low iron levels -high levels of iron in the blood -liver disease -an unusual or allergic reaction to iron, other medicines, foods, dyes, or preservatives -pregnant or trying to get pregnant -breast-feeding How should I use this medicine? This medicine is for infusion into a vein. It is given by a health care professional in a hospital or clinic setting. Talk to your pediatrician regarding the use of this medicine in children. Special care may be needed. Overdosage: If you think you have taken too much of this medicine contact a poison control center or emergency room at once. NOTE: This medicine is only for you. Do not share this medicine with others. What if I miss a dose? It is important not to miss your dose. Call your doctor or health care professional if you are unable to keep an appointment. What may interact with this medicine? Do not take this medicine with any of the following medications: -deferoxamine -dimercaprol -other iron products This medicine may also interact with the following medications: -chloramphenicol -deferasirox This list may not describe all possible interactions. Give your health care provider a list of all the medicines, herbs, non-prescription drugs, or dietary supplements you use. Also tell them if you smoke, drink alcohol, or use  illegal drugs. Some items may interact with your medicine. What should I watch for while using this medicine? Visit your doctor or health care professional regularly. Tell your doctor if your symptoms do not start to get better or if they get worse. You may need blood work done while you are taking this medicine. You may need to follow a special diet. Talk to your doctor. Foods that contain iron include: whole grains/cereals, dried fruits, beans, or peas, leafy green vegetables, and organ meats (liver, kidney). What side effects may I notice from receiving this medicine? Side effects that you should report to your doctor or health care professional as soon as possible: -allergic reactions like skin rash, itching or hives, swelling of the face, lips, or tongue -breathing problems -changes in blood pressure -feeling faint or lightheaded, falls -flushing, sweating, or hot feelings Side effects that usually do not require medical attention (report to your doctor or health care professional if they continue or are bothersome): -changes in taste -constipation -dizziness -headache -nausea -pain, redness, or irritation at site where injected -vomiting This list may not describe all possible side effects. Call your doctor for medical advice about side effects. You may report side effects to FDA at 1-800-FDA-1088. Where should I keep my medicine? This drug is given in a hospital or clinic and will not be stored at home. NOTE: This sheet is a summary. It may not cover all possible information. If you have questions about this medicine, talk to your doctor, pharmacist, or health care provider.  2018 Elsevier/Gold Standard (2015-12-15 11:20:47)  

## 2018-06-06 ENCOUNTER — Encounter: Payer: Self-pay | Admitting: *Deleted

## 2018-06-06 ENCOUNTER — Ambulatory Visit: Payer: 59

## 2018-06-06 ENCOUNTER — Telehealth: Payer: Self-pay

## 2018-06-06 LAB — KAPPA/LAMBDA LIGHT CHAINS
KAPPA FREE LGHT CHN: 60.2 mg/L — AB (ref 3.3–19.4)
Kappa, lambda light chain ratio: 5.52 — ABNORMAL HIGH (ref 0.26–1.65)
Lambda free light chains: 10.9 mg/L (ref 5.7–26.3)

## 2018-06-06 LAB — IGG, IGA, IGM
IGA: 41 mg/dL — AB (ref 90–386)
IgG (Immunoglobin G), Serum: 465 mg/dL — ABNORMAL LOW (ref 700–1600)
IgM (Immunoglobulin M), Srm: 944 mg/dL — ABNORMAL HIGH (ref 20–172)

## 2018-06-06 NOTE — Telephone Encounter (Signed)
Oral Oncology Patient Advocate Encounter  Prior Authorization for Imbruvica (Ibrutinib) has been approved.    PA# [id number] Effective dates: 06/05/18 through 06/06/19  Oral Oncology Clinic will continue to follow.   [place] Patient [place] Phone [phone] Fax [phone]

## 2018-06-06 NOTE — Telephone Encounter (Addendum)
Oral Oncology Patient Advocate Encounter  Patient copay for Edwin Webster was $150 on insurance, obtained a copay card that will bring his copayment down to $10 a month.  This information was shared with [facility]:   [id number] Group: [id number] ID: [id number]   [place] Patient [place] Phone [phone] Fax [phone]

## 2018-06-09 MED FILL — IMBRUVICA 420 MG TAB: 420 | 28 days supply | Qty: 28 | Fill #0

## 2018-06-10 LAB — PROTEIN ELECTROPHORESIS, SERUM, WITH REFLEX
A/G Ratio: 1.3 (ref 0.7–1.7)
ALBUMIN ELP: 3.7 g/dL (ref 2.9–4.4)
ALPHA-1-GLOBULIN: 0.2 g/dL (ref 0.0–0.4)
Alpha-2-Globulin: 0.7 g/dL (ref 0.4–1.0)
Beta Globulin: 0.8 g/dL (ref 0.7–1.3)
GAMMA GLOBULIN: 1.1 g/dL (ref 0.4–1.8)
Globulin, Total: 2.8 g/dL (ref 2.2–3.9)
M-SPIKE, %: 0.6 g/dL — AB
SPEP Interpretation: 0
Total Protein ELP: 6.5 g/dL (ref 6.0–8.5)

## 2018-06-10 LAB — IMMUNOFIXATION REFLEX, SERUM
IGA: 43 mg/dL — AB (ref 90–386)
IGG (IMMUNOGLOBIN G), SERUM: 506 mg/dL — AB (ref 700–1600)
IGM (IMMUNOGLOBULIN M), SRM: 975 mg/dL — AB (ref 20–172)

## 2018-06-13 ENCOUNTER — Telehealth: Payer: Self-pay | Admitting: *Deleted

## 2018-06-13 NOTE — Telephone Encounter (Signed)
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  Otilio Carpen notified and will fax to [phone].

## 2018-06-18 ENCOUNTER — Telehealth: Payer: Self-pay | Admitting: Hematology & Oncology

## 2018-06-18 ENCOUNTER — Telehealth: Payer: Self-pay | Admitting: *Deleted

## 2018-06-18 NOTE — Telephone Encounter (Signed)
Faxed medical records to: [facility] [facility]  for  [facility] [birth year] CASE: [id number]         COPY SCANNED

## 2018-06-18 NOTE — Telephone Encounter (Signed)
Patient has some questions about Imbruvica. He wants to start taking his medication in the evening instead of morning. He wants to make sure this is okay.   Reviewed with patient that he should take his pills at the same time daily, but changing this one time is okay. Educated him that he shouldn't change the dosing schedule frequently and he understands. He would like to move it to the evening and leave it. He will make the change tomorrow.   He also asked about his ibuprofen. He would like to take it occassionally. Educated patient to always take with a meal. He also takes a pepcid tablet when he takes ibuprofen.  Instructed patient to call if she has any other questions or concerns.

## 2018-06-26 NOTE — Telephone Encounter (Signed)
Oral Chemotherapy Pharmacist Encounter  Patient Education I spoke with patient for overview of new oral chemotherapy medication: Imbruvica (ibrutinib) for the treatment of Waldenstrom Macroglobulinemia, planned duration until disease progression or unacceptable drug toxicity.   Pt is doing well. Counseled patient on administration, dosing, side effects, monitoring, drug-food interactions, safe handling, storage, and disposal. Patient will take 420 mg by mouth daily.  Side effects include but not limited to: decreased plt/wbc/hgb, diarrhea, fatigue, N/V, muscle/joint pain.    Reviewed with patient importance of keeping a medication schedule and plan for any missed doses.  Mr. Leisinger voiced understanding and appreciation. All questions answered.  Provided patient with [facility] phone number. Patient knows to call the office with questions or concerns. Oral [facility] will continue to follow.  Darl Pikes, PharmD, BCPS, BCOP Hematology/Oncology Clinical Pharmacist [facility] [phone]

## 2018-07-01 MED FILL — IMBRUVICA 420 MG TAB: 420 | 28 days supply | Qty: 28 | Fill #1

## 2018-07-03 ENCOUNTER — Ambulatory Visit: Payer: 59 | Admitting: Hematology & Oncology

## 2018-07-03 ENCOUNTER — Ambulatory Visit: Payer: 59

## 2018-07-03 ENCOUNTER — Other Ambulatory Visit: Payer: 59

## 2018-07-04 ENCOUNTER — Ambulatory Visit: Payer: 59

## 2018-07-09 ENCOUNTER — Inpatient Hospital Stay: Payer: 59 | Attending: Hematology & Oncology | Admitting: Hematology & Oncology

## 2018-07-09 ENCOUNTER — Inpatient Hospital Stay: Payer: 59

## 2018-07-09 ENCOUNTER — Other Ambulatory Visit: Payer: Self-pay

## 2018-07-09 VITALS — BP 147/84 | HR 51 | Temp 97.9°F | Resp 18 | Wt 167.0 lb

## 2018-07-09 DIAGNOSIS — C880 Waldenstrom macroglobulinemia: Secondary | ICD-10-CM

## 2018-07-09 DIAGNOSIS — D509 Iron deficiency anemia, unspecified: Secondary | ICD-10-CM | POA: Diagnosis not present

## 2018-07-09 DIAGNOSIS — D500 Iron deficiency anemia secondary to blood loss (chronic): Secondary | ICD-10-CM

## 2018-07-09 LAB — CMP (CANCER CENTER ONLY)
ALBUMIN: 3.9 g/dL (ref 3.5–5.0)
ALK PHOS: 65 U/L (ref 26–84)
ALT: 16 U/L (ref 10–47)
AST: 24 U/L (ref 11–38)
Anion gap: 4 — ABNORMAL LOW (ref 5–15)
BUN: 11 mg/dL (ref 7–22)
CHLORIDE: 104 mmol/L (ref 98–108)
CO2: 29 mmol/L (ref 18–33)
CREATININE: 1.1 mg/dL (ref 0.60–1.20)
Calcium: 9.6 mg/dL (ref 8.0–10.3)
GLUCOSE: 95 mg/dL (ref 73–118)
Potassium: 4 mmol/L (ref 3.3–4.7)
SODIUM: 137 mmol/L (ref 128–145)
TOTAL PROTEIN: 6.8 g/dL (ref 6.4–8.1)
Total Bilirubin: 0.8 mg/dL (ref 0.2–1.6)

## 2018-07-09 LAB — CBC WITH DIFFERENTIAL (CANCER CENTER ONLY)
BASOS PCT: 1 %
Basophils Absolute: 0 10*3/uL (ref 0.0–0.1)
EOS ABS: 0.1 10*3/uL (ref 0.0–0.5)
EOS PCT: 1 %
HCT: 38.6 % — ABNORMAL LOW (ref 38.7–49.9)
HEMOGLOBIN: 13 g/dL (ref 13.0–17.1)
Lymphocytes Relative: 13 %
Lymphs Abs: 0.6 10*3/uL — ABNORMAL LOW (ref 0.9–3.3)
MCH: 33.2 pg (ref 28.0–33.4)
MCHC: 33.7 g/dL (ref 32.0–35.9)
MCV: 98.7 fL — ABNORMAL HIGH (ref 82.0–98.0)
Monocytes Absolute: 0.8 10*3/uL (ref 0.1–0.9)
Monocytes Relative: 18 %
NEUTROS PCT: 67 %
Neutro Abs: 2.9 10*3/uL (ref 1.5–6.5)
PLATELETS: 216 10*3/uL (ref 145–400)
RBC: 3.91 MIL/uL — AB (ref 4.20–5.70)
RDW: 13.2 % (ref 11.1–15.7)
WBC Count: 4.3 10*3/uL (ref 4.0–10.0)

## 2018-07-09 LAB — IRON AND TIBC
Iron: 109 ug/dL (ref 42–163)
Saturation Ratios: 40 % — ABNORMAL LOW (ref 42–163)
TIBC: 274 ug/dL (ref 202–409)
UIBC: 164 ug/dL

## 2018-07-09 LAB — FERRITIN: FERRITIN: 556 ng/mL — AB (ref 24–336)

## 2018-07-09 NOTE — Progress Notes (Signed)
Hematology and Oncology Follow Up Visit  Edwin Webster [id number] 1960/11/28 57 y.o. 07/09/2018   Principle Diagnosis:  Waldenstrom's macroglobulinemia Iron deficiency anemia  Current Therapy:   Rituxan/bendamustine-s/p cycle 2 -- d/c on 06/11/2018 Imbruvica 420 mg po q day -- started on 06/14/2018 IV iron as indicated - last received in April 2019   Interim History:  Edwin Webster is here today for follow-up.  We now have him on Imbruvica.  We started this as his response to the Rituxan/bendamustine was definitely suboptimal.  In July, his IgM level was 975 mg/dL.  This is after 2 cycles of treatment.  I thought that switch him to [facility] would be a good idea.  He has tolerated Imbruvica quite nicely.  He has had no palpitations.  He has had a couple episodes of dizziness.  He has had no falling.  There is been no nausea or vomiting.  He is had no swelling.  He has had no rashes.  He has had one episode of diarrhea.  This lasted just one day.  He did not take anything for it.  He has had no fever.  There is been no mouth sores.  He has had no cough or shortness of breath.  Overall, his performance status is ECOG 1.  Medications:  Allergies as of 07/09/2018      Reactions   Humira [adalimumab] Other (See Comments)   "blacked out", confused      Medication List        Accurate as of 07/09/18 10:51 AM. Always use your most recent med list.          aspirin EC 81 MG tablet Take 81 mg daily by mouth.   atorvastatin 80 MG tablet Commonly known as:  LIPITOR Take 1/2 to 1 tablet as directed for cholesterol   bisoprolol 10 MG tablet Commonly known as:  ZEBETA TAKE 1/2 TO 1 TABLET DAILY FOR BP   escitalopram 10 MG tablet Commonly known as:  LEXAPRO Take 20 mg by mouth daily.   famciclovir 500 MG tablet Commonly known as:  FAMVIR Take 1 tablet (500 mg total) by mouth daily.   famotidine 40 MG tablet Commonly known as:  PEPCID Take 1/2-1 tab with ibuprofen as  needed.   fluticasone 50 MCG/ACT nasal spray Commonly known as:  FLONASE PLACE 2 SPRAYS INTO BOTH NOSTRILS DAILY.   folic acid 1 MG tablet Commonly known as:  FOLVITE Take 1 mg by mouth daily.   Ibrutinib 420 MG Tabs Take 420 mg by mouth daily.   ibuprofen 800 MG tablet Commonly known as:  ADVIL,MOTRIN Take 1 tablet (800 mg total) by mouth daily as needed.   levocetirizine 5 MG tablet Commonly known as:  XYZAL TAKE 1 TABLET (5 MG TOTAL) BY MOUTH EVERY EVENING.   meclizine 25 MG tablet Commonly known as:  ANTIVERT Take 1 tablet (25 mg total) by mouth 3 (three) times daily as needed for dizziness.   valACYclovir 1000 MG tablet Commonly known as:  VALTREX Take 1 tablet (1,000 mg total) by mouth 3 (three) times daily. Take for 7 days   Vitamin D-3 5000 units Tabs Take 10,000 Units by mouth daily.       Allergies:  Allergies  Allergen Reactions  . Humira [Adalimumab] Other (See Comments)    "blacked out", confused    Past Medical History, Surgical history, Social history, and Family History were reviewed and updated.  Review of Systems: Review of Systems  Constitutional: Negative.  HENT: Negative.   Eyes: Negative.   Respiratory: Negative.   Cardiovascular: Negative.   Gastrointestinal: Negative.   Genitourinary: Negative.   Musculoskeletal: Negative.   Skin: Negative.   Neurological: Negative.   Endo/Heme/Allergies: Negative.   Psychiatric/Behavioral: Negative.       Physical Exam:  weight is 167 lb (75.8 kg). His oral temperature is 97.9 F (36.6 C). His blood pressure is 147/84 (abnormal) and his pulse is 51 (abnormal). His respiration is 18 and oxygen saturation is 100%.   Wt Readings from Last 3 Encounters:  07/09/18 167 lb (75.8 kg)  06/05/18 169 lb (76.7 kg)  05/08/18 168 lb (76.2 kg)    Physical Exam  Constitutional: He is oriented to person, place, and time.  HENT:  Head: Normocephalic and atraumatic.  Mouth/Throat: Oropharynx is clear  and moist.  Eyes: Pupils are equal, round, and reactive to light. EOM are normal.  Neck: Normal range of motion.  Cardiovascular: Normal rate, regular rhythm and normal heart sounds.  Pulmonary/Chest: Effort normal and breath sounds normal.  Abdominal: Soft. Bowel sounds are normal.  Musculoskeletal: Normal range of motion. He exhibits no edema, tenderness or deformity.  Lymphadenopathy:    He has no cervical adenopathy.  Neurological: He is alert and oriented to person, place, and time.  Skin: Skin is warm and dry. No rash noted. No erythema.  Psychiatric: He has a normal mood and affect. His behavior is normal. Judgment and thought content normal.  Vitals reviewed.    Lab Results  Component Value Date   WBC 4.3 07/09/2018   HGB 13.0 07/09/2018   HCT 38.6 (L) 07/09/2018   MCV 98.7 (H) 07/09/2018   PLT 216 07/09/2018   Lab Results  Component Value Date   FERRITIN 413 (H) 05/08/2018   IRON 94 05/08/2018   TIBC 243 05/08/2018   UIBC 149 05/08/2018   IRONPCTSAT 39 (L) 05/08/2018   Lab Results  Component Value Date   RETICCTPCT 0.9 02/12/2018   RBC 3.91 (L) 07/09/2018   RETICCTABS 33,120 02/12/2018   Lab Results  Component Value Date   KPAFRELGTCHN 60.2 (H) 06/05/2018   LAMBDASER 10.9 06/05/2018   KAPLAMBRATIO 5.52 (H) 06/05/2018   Lab Results  Component Value Date   IGGSERUM 465 (L) 06/05/2018   IGGSERUM 506 (L) 06/05/2018   IGA 41 (L) 06/05/2018   IGA 43 (L) 06/05/2018   IGMSERUM 944 (H) 06/05/2018   IGMSERUM 975 (H) 06/05/2018   Lab Results  Component Value Date   TOTALPROTELP 6.5 06/05/2018   ALBUMINELP 3.7 06/05/2018   A1GS 0.2 06/05/2018   A2GS 0.7 06/05/2018   BETS 0.8 06/05/2018   BETA2SER 0.2 02/18/2018   GAMS 1.1 06/05/2018   MSPIKE 0.6 (H) 06/05/2018   SPEI  02/18/2018     Comment:     . Evaluation reveals a restricted band (M-spike)  migrating in the gamma globulin region.  Consider  immunofixation analysis if indicated. .      Chemistry       Component Value Date/Time   NA 137 07/09/2018 0902   K 4.0 07/09/2018 0902   CL 104 07/09/2018 0902   CO2 29 07/09/2018 0902   BUN 11 07/09/2018 0902   CREATININE 1.10 07/09/2018 0902   CREATININE 1.33 02/24/2018 1650      Component Value Date/Time   CALCIUM 9.6 07/09/2018 0902   ALKPHOS 65 07/09/2018 0902   AST 24 07/09/2018 0902   ALT 16 07/09/2018 0902   BILITOT 0.8 07/09/2018 0902  Impression and Plan: Mr. Mcneel is a very pleasant 57 yo caucasian gentleman with Waldenstrom's macroglobulinemia.   Hopefully, we will see a drop in the IgM level.  His total protein level is coming down a little bit which is encouraging.  We will see how the level looks.  I will plan to get him back in 6 weeks.  If all looks good in 6 weeks, then we can try to move his appointments out to every 3 months.  He would be very happy with this.    [name], MD 8/14/201910:51 AM

## 2018-07-10 ENCOUNTER — Telehealth: Payer: Self-pay | Admitting: *Deleted

## 2018-07-10 LAB — PROTEIN ELECTROPHORESIS, SERUM
A/G RATIO SPE: 1.5 (ref 0.7–1.7)
ALBUMIN ELP: 3.9 g/dL (ref 2.9–4.4)
ALPHA-2-GLOBULIN: 0.6 g/dL (ref 0.4–1.0)
Alpha-1-Globulin: 0.3 g/dL (ref 0.0–0.4)
BETA GLOBULIN: 0.8 g/dL (ref 0.7–1.3)
Gamma Globulin: 0.9 g/dL (ref 0.4–1.8)
Globulin, Total: 2.6 g/dL (ref 2.2–3.9)
M-Spike, %: 0.3 g/dL — ABNORMAL HIGH
Total Protein ELP: 6.5 g/dL (ref 6.0–8.5)

## 2018-07-10 LAB — IGG, IGA, IGM
IGG (IMMUNOGLOBIN G), SERUM: 536 mg/dL — AB (ref 700–1600)
IGM (IMMUNOGLOBULIN M), SRM: 547 mg/dL — AB (ref 20–172)
IgA: 33 mg/dL — ABNORMAL LOW (ref 90–386)

## 2018-07-10 LAB — KAPPA/LAMBDA LIGHT CHAINS
KAPPA, LAMDA LIGHT CHAIN RATIO: 2.43 — AB (ref 0.26–1.65)
Kappa free light chain: 30.4 mg/L — ABNORMAL HIGH (ref 3.3–19.4)
Lambda free light chains: 12.5 mg/L (ref 5.7–26.3)

## 2018-07-10 NOTE — Telephone Encounter (Addendum)
Patient is aware of results  ----- Message from [name], MD sent at 07/10/2018 12:56 PM EDT ----- Call - the IgM level is down to 550!!!  [place] job!!  Laurey Arrow

## 2018-07-23 ENCOUNTER — Other Ambulatory Visit: Payer: Self-pay | Admitting: Physician Assistant

## 2018-07-31 MED FILL — IMBRUVICA 420 MG TAB: 420 | 28 days supply | Qty: 28 | Fill #2

## 2018-08-06 ENCOUNTER — Telehealth: Payer: Self-pay | Admitting: *Deleted

## 2018-08-06 NOTE — Telephone Encounter (Signed)
Patient c/o post nasal drip and sinus congestion. He wants to know what he can take for symptom management. Occasional cough. No fever.   Spoke with Dr Marin Olp and patient can take any OTC medication for management of symptoms. He needs to call back if he begins to have fevers. Patient is aware of recommendations and instruction.

## 2018-08-20 ENCOUNTER — Encounter: Payer: Self-pay | Admitting: Hematology & Oncology

## 2018-08-20 ENCOUNTER — Inpatient Hospital Stay: Payer: 59

## 2018-08-20 ENCOUNTER — Inpatient Hospital Stay: Payer: 59 | Attending: Hematology & Oncology | Admitting: Hematology & Oncology

## 2018-08-20 ENCOUNTER — Other Ambulatory Visit: Payer: Self-pay

## 2018-08-20 VITALS — BP 146/85 | HR 54 | Temp 97.8°F | Resp 20 | Wt 171.8 lb

## 2018-08-20 DIAGNOSIS — D509 Iron deficiency anemia, unspecified: Secondary | ICD-10-CM | POA: Insufficient documentation

## 2018-08-20 DIAGNOSIS — C880 Waldenstrom macroglobulinemia: Secondary | ICD-10-CM

## 2018-08-20 DIAGNOSIS — R5382 Chronic fatigue, unspecified: Secondary | ICD-10-CM

## 2018-08-20 DIAGNOSIS — M069 Rheumatoid arthritis, unspecified: Secondary | ICD-10-CM | POA: Insufficient documentation

## 2018-08-20 LAB — CMP (CANCER CENTER ONLY)
ALBUMIN: 4 g/dL (ref 3.5–5.0)
ALK PHOS: 84 U/L (ref 26–84)
ALT: 23 U/L (ref 10–47)
ANION GAP: 4 — AB (ref 5–15)
AST: 21 U/L (ref 11–38)
BILIRUBIN TOTAL: 0.7 mg/dL (ref 0.2–1.6)
BUN: 13 mg/dL (ref 7–22)
CALCIUM: 9.3 mg/dL (ref 8.0–10.3)
CO2: 27 mmol/L (ref 18–33)
CREATININE: 1.3 mg/dL — AB (ref 0.60–1.20)
Chloride: 109 mmol/L — ABNORMAL HIGH (ref 98–108)
Glucose, Bld: 62 mg/dL — ABNORMAL LOW (ref 73–118)
Potassium: 3.4 mmol/L (ref 3.3–4.7)
Sodium: 140 mmol/L (ref 128–145)
TOTAL PROTEIN: 6.8 g/dL (ref 6.4–8.1)

## 2018-08-20 LAB — CBC WITH DIFFERENTIAL (CANCER CENTER ONLY)
BASOS PCT: 1 %
Basophils Absolute: 0 10*3/uL (ref 0.0–0.1)
EOS ABS: 0.1 10*3/uL (ref 0.0–0.5)
Eosinophils Relative: 1 %
HCT: 38.8 % (ref 38.7–49.9)
Hemoglobin: 13.1 g/dL (ref 13.0–17.1)
LYMPHS ABS: 0.9 10*3/uL (ref 0.9–3.3)
Lymphocytes Relative: 11 %
MCH: 33.5 pg — AB (ref 28.0–33.4)
MCHC: 33.8 g/dL (ref 32.0–35.9)
MCV: 99.2 fL — ABNORMAL HIGH (ref 82.0–98.0)
MONOS PCT: 11 %
Monocytes Absolute: 0.9 10*3/uL (ref 0.1–0.9)
Neutro Abs: 6.3 10*3/uL (ref 1.5–6.5)
Neutrophils Relative %: 76 %
Platelet Count: 247 10*3/uL (ref 145–400)
RBC: 3.91 MIL/uL — AB (ref 4.20–5.70)
RDW: 12.3 % (ref 11.1–15.7)
WBC: 8.2 10*3/uL (ref 4.0–10.0)

## 2018-08-20 MED ORDER — OMEPRAZOLE-SODIUM BICARBONATE 40-1100 MG PO CAPS: 1.0000 | ORAL_CAPSULE | Freq: Every day | ORAL | 4 refills | Status: DC

## 2018-08-20 NOTE — Progress Notes (Signed)
Hematology and Oncology Follow Up Visit  Edwin Webster [id number] 02-04-1961 57 y.o. 08/20/2018   Principle Diagnosis:  Waldenstrom's macroglobulinemia Iron deficiency anemia  Current Therapy:   Rituxan/bendamustine-s/p cycle 2 -- d/c on 06/11/2018 Imbruvica 420 mg po q day -- started on 06/14/2018 IV iron as indicated - last received in April 2019   Interim History:  Mr. Brull is here today for follow-up.  So far, he is been doing very well with the [place].  However, his wife says that he has very little energy.  He is not working.  He is having some night sweats.  I just have a hard time believing this is all from the [facility].  His Waldenstrm's is responding nicely.  His M spike was down to 0.3 g/dL.  His IgM level was 547 mg/dL.  When we first started his treatment, his IgM was 1350 mg/dL.  He does have arthritic issues.  He has rheumatoid arthritis.  He is on ibuprofen for this.  He says that his stomach has been hurting.  He has been taking Pepcid with this.  I will switch out the Pepcid and sent in a prescription for Zegerid.  I told him that he is going to have to be very cautious with the ibuprofen.  We may need to get his rheumatologist to manage the medications for the RA.  I am checking his testosterone level.  I am checking his thyroid level.  His iron levels have been doing pretty well.  Overall, his performance status is ECOG 1.    Medications:  Allergies as of 08/20/2018      Reactions   Humira [adalimumab] Other (See Comments)   "blacked out", confused      Medication List        Accurate as of 08/20/18 12:09 PM. Always use your most recent med list.          aspirin EC 81 MG tablet Take 81 mg daily by mouth.   atorvastatin 80 MG tablet Commonly known as:  LIPITOR Take 80 mg by mouth daily.   atorvastatin 80 MG tablet Commonly known as:  LIPITOR Take 1/2 to 1 tablet as directed for cholesterol   bisoprolol 10 MG tablet Commonly known  as:  ZEBETA TAKE 1/2 TO 1 TABLET DAILY FOR BP   escitalopram 20 MG tablet Commonly known as:  LEXAPRO Take 20 mg by mouth daily.   famciclovir 500 MG tablet Commonly known as:  FAMVIR Take 1 tablet (500 mg total) by mouth daily.   famotidine 40 MG tablet Commonly known as:  PEPCID Take 1/2-1 tab with ibuprofen as needed.   fluticasone 50 MCG/ACT nasal spray Commonly known as:  FLONASE PLACE 2 SPRAYS INTO BOTH NOSTRILS DAILY.   folic acid 1 MG tablet Commonly known as:  FOLVITE Take 1 mg by mouth daily.   Ibrutinib 420 MG Tabs Take 420 mg by mouth daily.   ibuprofen 800 MG tablet Commonly known as:  ADVIL,MOTRIN Take 1 tablet (800 mg total) by mouth daily as needed.   levocetirizine 5 MG tablet Commonly known as:  XYZAL TAKE 1 TABLET (5 MG TOTAL) BY MOUTH EVERY EVENING.   meclizine 25 MG tablet Commonly known as:  ANTIVERT Take 1 tablet (25 mg total) by mouth 3 (three) times daily as needed for dizziness.   omeprazole-sodium bicarbonate 40-1100 MG capsule Commonly known as:  ZEGERID Take 1 capsule by mouth daily before breakfast.   valACYclovir 1000 MG tablet Commonly known as:  VALTREX  Take 1 tablet (1,000 mg total) by mouth 3 (three) times daily. Take for 7 days   Vitamin D-3 5000 units Tabs Take 10,000 Units by mouth daily.       Allergies:  Allergies  Allergen Reactions  . Humira [Adalimumab] Other (See Comments)    "blacked out", confused    Past Medical History, Surgical history, Social history, and Family History were reviewed and updated.  Review of Systems: Review of Systems  Constitutional: Negative.   HENT: Negative.   Eyes: Negative.   Respiratory: Negative.   Cardiovascular: Negative.   Gastrointestinal: Negative.   Genitourinary: Negative.   Musculoskeletal: Negative.   Skin: Negative.   Neurological: Negative.   Endo/Heme/Allergies: Negative.   Psychiatric/Behavioral: Negative.       Physical Exam:  weight is 171 lb 12.8 oz  (77.9 kg). His oral temperature is 97.8 F (36.6 C). His blood pressure is 146/85 (abnormal) and his pulse is 54 (abnormal). His respiration is 20 and oxygen saturation is 100%.   Wt Readings from Last 3 Encounters:  08/20/18 171 lb 12.8 oz (77.9 kg)  07/09/18 167 lb (75.8 kg)  06/05/18 169 lb (76.7 kg)    Physical Exam  Constitutional: He is oriented to person, place, and time.  HENT:  Head: Normocephalic and atraumatic.  Mouth/Throat: Oropharynx is clear and moist.  Eyes: Pupils are equal, round, and reactive to light. EOM are normal.  Neck: Normal range of motion.  Cardiovascular: Normal rate, regular rhythm and normal heart sounds.  Pulmonary/Chest: Effort normal and breath sounds normal.  Abdominal: Soft. Bowel sounds are normal.  Musculoskeletal: Normal range of motion. He exhibits no edema, tenderness or deformity.  Lymphadenopathy:    He has no cervical adenopathy.  Neurological: He is alert and oriented to person, place, and time.  Skin: Skin is warm and dry. No rash noted. No erythema.  Psychiatric: He has a normal mood and affect. His behavior is normal. Judgment and thought content normal.  Vitals reviewed.    Lab Results  Component Value Date   WBC 8.2 08/20/2018   HGB 13.1 08/20/2018   HCT 38.8 08/20/2018   MCV 99.2 (H) 08/20/2018   PLT 247 08/20/2018   Lab Results  Component Value Date   FERRITIN 556 (H) 07/09/2018   IRON 109 07/09/2018   TIBC 274 07/09/2018   UIBC 164 07/09/2018   IRONPCTSAT 40 (L) 07/09/2018   Lab Results  Component Value Date   RETICCTPCT 0.9 02/12/2018   RBC 3.91 (L) 08/20/2018   RETICCTABS 33,120 02/12/2018   Lab Results  Component Value Date   KPAFRELGTCHN 30.4 (H) 07/09/2018   LAMBDASER 12.5 07/09/2018   KAPLAMBRATIO 2.43 (H) 07/09/2018   Lab Results  Component Value Date   IGGSERUM 536 (L) 07/09/2018   IGA 33 (L) 07/09/2018   IGMSERUM 547 (H) 07/09/2018   Lab Results  Component Value Date   TOTALPROTELP 6.5  07/09/2018   ALBUMINELP 3.9 07/09/2018   A1GS 0.3 07/09/2018   A2GS 0.6 07/09/2018   BETS 0.8 07/09/2018   BETA2SER 0.2 02/18/2018   GAMS 0.9 07/09/2018   MSPIKE 0.3 (H) 07/09/2018   SPEI Comment 07/09/2018     Chemistry      Component Value Date/Time   NA 140 08/20/2018 1034   K 3.4 08/20/2018 1034   CL 109 (H) 08/20/2018 1034   CO2 27 08/20/2018 1034   BUN 13 08/20/2018 1034   CREATININE 1.30 (H) 08/20/2018 1034   CREATININE 1.33 02/24/2018 1650  Component Value Date/Time   CALCIUM 9.3 08/20/2018 1034   ALKPHOS 84 08/20/2018 1034   AST 21 08/20/2018 1034   ALT 23 08/20/2018 1034   BILITOT 0.7 08/20/2018 1034      Impression and Plan: Mr. Fariss is a very pleasant 57 yo caucasian gentleman with Waldenstrom's macroglobulinemia.   We will continue the [place].  Hopefully, the Waldenstrm's is lower.  Again he is responded well.  Hopefully, we will be able to get him on a lower dose of Imbruvica for maintenance.  I think we can get him back in 2 months now.  We will see what his testosterone level is and his thyroid levels are.Marland Kitchen    [name], MD 9/25/201912:09 PM

## 2018-08-21 ENCOUNTER — Other Ambulatory Visit: Payer: Self-pay | Admitting: Internal Medicine

## 2018-08-21 ENCOUNTER — Encounter: Payer: Self-pay | Admitting: *Deleted

## 2018-08-21 ENCOUNTER — Telehealth: Payer: Self-pay | Admitting: *Deleted

## 2018-08-21 ENCOUNTER — Encounter: Payer: Self-pay | Admitting: Internal Medicine

## 2018-08-21 ENCOUNTER — Ambulatory Visit (INDEPENDENT_AMBULATORY_CARE_PROVIDER_SITE_OTHER): Payer: 59 | Admitting: *Deleted

## 2018-08-21 DIAGNOSIS — F331 Major depressive disorder, recurrent, moderate: Secondary | ICD-10-CM

## 2018-08-21 DIAGNOSIS — Z23 Encounter for immunization: Secondary | ICD-10-CM

## 2018-08-21 DIAGNOSIS — F3341 Major depressive disorder, recurrent, in partial remission: Secondary | ICD-10-CM | POA: Insufficient documentation

## 2018-08-21 DIAGNOSIS — F330 Major depressive disorder, recurrent, mild: Secondary | ICD-10-CM | POA: Insufficient documentation

## 2018-08-21 DIAGNOSIS — F419 Anxiety disorder, unspecified: Secondary | ICD-10-CM

## 2018-08-21 LAB — IGG, IGA, IGM
IGM (IMMUNOGLOBULIN M), SRM: 499 mg/dL — AB (ref 20–172)
IgA: 31 mg/dL — ABNORMAL LOW (ref 90–386)
IgG (Immunoglobin G), Serum: 493 mg/dL — ABNORMAL LOW (ref 700–1600)

## 2018-08-21 LAB — TESTOSTERONE: Testosterone: 595 ng/dL (ref 264–916)

## 2018-08-21 LAB — KAPPA/LAMBDA LIGHT CHAINS
KAPPA FREE LGHT CHN: 30.8 mg/L — AB (ref 3.3–19.4)
Kappa, lambda light chain ratio: 2.41 — ABNORMAL HIGH (ref 0.26–1.65)
LAMDA FREE LIGHT CHAINS: 12.8 mg/L (ref 5.7–26.3)

## 2018-08-21 LAB — TSH: TSH: 2.88 u[IU]/mL (ref 0.320–4.118)

## 2018-08-21 MED ORDER — BUPROPION HCL ER (XL) 300 MG PO TB24: ORAL_TABLET | ORAL | 1 refills | Status: DC

## 2018-08-21 MED ORDER — ALPRAZOLAM 1 MG PO TABS: ORAL_TABLET | ORAL | 1 refills | Status: DC

## 2018-08-21 NOTE — Telephone Encounter (Addendum)
Message left. Will send mychart message.   ----- Message from [name], MD sent at 08/21/2018  8:18 AM EDT ----- Call - IgM is still coming down.  pete

## 2018-08-22 LAB — PROTEIN ELECTROPHORESIS, SERUM, WITH REFLEX
A/G Ratio: 1.5 (ref 0.7–1.7)
ALBUMIN ELP: 3.8 g/dL (ref 2.9–4.4)
ALPHA-2-GLOBULIN: 0.7 g/dL (ref 0.4–1.0)
Alpha-1-Globulin: 0.3 g/dL (ref 0.0–0.4)
BETA GLOBULIN: 0.9 g/dL (ref 0.7–1.3)
Gamma Globulin: 0.8 g/dL (ref 0.4–1.8)
Globulin, Total: 2.6 g/dL (ref 2.2–3.9)
M-Spike, %: 0.4 g/dL — ABNORMAL HIGH
SPEP INTERP: 0
Total Protein ELP: 6.4 g/dL (ref 6.0–8.5)

## 2018-08-22 LAB — IMMUNOFIXATION REFLEX, SERUM
IGG (IMMUNOGLOBIN G), SERUM: 505 mg/dL — AB (ref 700–1600)
IGM (IMMUNOGLOBULIN M), SRM: 503 mg/dL — AB (ref 20–172)
IgA: 31 mg/dL — ABNORMAL LOW (ref 90–386)

## 2018-08-25 ENCOUNTER — Telehealth: Payer: Self-pay | Admitting: *Deleted

## 2018-08-25 NOTE — Telephone Encounter (Addendum)
Patient is aware of results.   ----- Message from [name], MD sent at 08/24/2018  7:45 AM EDT ----- Call - IgM level is stable at 500!!!  This is still good!!  Film/video editor

## 2018-09-04 MED FILL — IMBRUVICA 420 MG TAB: 420 | 28 days supply | Qty: 28 | Fill #3

## 2018-09-24 ENCOUNTER — Ambulatory Visit: Payer: 59 | Admitting: Internal Medicine

## 2018-09-24 VITALS — BP 168/92 | HR 64 | Temp 97.5°F | Resp 16 | Ht 72.0 in | Wt 175.8 lb

## 2018-09-24 DIAGNOSIS — E559 Vitamin D deficiency, unspecified: Secondary | ICD-10-CM | POA: Diagnosis not present

## 2018-09-24 DIAGNOSIS — R7309 Other abnormal glucose: Secondary | ICD-10-CM | POA: Diagnosis not present

## 2018-09-24 DIAGNOSIS — K219 Gastro-esophageal reflux disease without esophagitis: Secondary | ICD-10-CM

## 2018-09-24 DIAGNOSIS — J0140 Acute pansinusitis, unspecified: Secondary | ICD-10-CM

## 2018-09-24 DIAGNOSIS — E782 Mixed hyperlipidemia: Secondary | ICD-10-CM | POA: Diagnosis not present

## 2018-09-24 DIAGNOSIS — H6642 Suppurative otitis media, unspecified, left ear: Secondary | ICD-10-CM

## 2018-09-24 DIAGNOSIS — F419 Anxiety disorder, unspecified: Secondary | ICD-10-CM

## 2018-09-24 DIAGNOSIS — Z79899 Other long term (current) drug therapy: Secondary | ICD-10-CM

## 2018-09-24 DIAGNOSIS — I10 Essential (primary) hypertension: Secondary | ICD-10-CM | POA: Diagnosis not present

## 2018-09-24 MED ORDER — LOSARTAN POTASSIUM 100 MG PO TABS: ORAL_TABLET | ORAL | 1 refills | Status: DC

## 2018-09-24 MED ORDER — PREDNISONE 20 MG PO TABS: ORAL_TABLET | ORAL | 0 refills | Status: DC

## 2018-09-24 MED ORDER — LEVOFLOXACIN 500 MG PO TABS: ORAL_TABLET | ORAL | 1 refills | Status: DC

## 2018-09-24 MED ORDER — BUSPIRONE HCL 5 MG PO TABS: ORAL_TABLET | ORAL | 2 refills | Status: DC

## 2018-09-24 NOTE — Patient Instructions (Signed)
Recommend Adult Low Dose Aspirin or  coated  Aspirin 81 mg daily  To reduce risk of Colon Cancer 20 %,  Skin Cancer 26 % ,  Melanoma 46%  and  Pancreatic cancer 60% +++++++++++++++++++++++++ Vitamin D goal  is between 70-100.  Please make sure that you are taking your Vitamin D as directed.  It is very important as a natural anti-inflammatory  helping hair, skin, and nails, as well as reducing stroke and heart attack risk.  It helps your bones and helps with mood. It also decreases numerous cancer risks so please take it as directed.  Low Vit D is associated with a 200-300% higher risk for CANCER  and 200-300% higher risk for HEART   ATTACK  &  STROKE.   ...................................... It is also associated with higher death rate at younger ages,  autoimmune diseases like Rheumatoid arthritis, Lupus, Multiple Sclerosis.    Also many other serious conditions, like depression, Alzheimer's Dementia, infertility, muscle aches, fatigue, fibromyalgia - just to name a few. ++++++++++++++++++++ Recommend the book "The END of DIETING" by Dr Joel Fuhrman  & the book "The END of DIABETES " by Dr Joel Fuhrman At [employer] - get book & Audio CD's    Being diabetic has a  300% increased risk for heart attack, stroke, cancer, and alzheimer- type vascular dementia. It is very important that you work harder with diet by avoiding all foods that are white. Avoid white rice (Kaffenberger & wild rice is OK), white potatoes (sweetpotatoes in moderation is OK), White bread or wheat bread or anything made out of white flour like bagels, donuts, rolls, buns, biscuits, cakes, pastries, cookies, pizza crust, and pasta (made from white flour & egg whites) - vegetarian pasta or spinach or wheat pasta is OK. Multigrain breads like Arnold's or Pepperidge Farm, or multigrain sandwich thins or flatbreads.  Diet, exercise and weight loss can reverse and cure diabetes in the early stages.  Diet, exercise and weight loss is  very important in the control and prevention of complications of diabetes which affects every system in your body, ie. Brain - dementia/stroke, eyes - glaucoma/blindness, heart - heart attack/heart failure, kidneys - dialysis, stomach - gastric paralysis, intestines - malabsorption, nerves - severe painful neuritis, circulation - gangrene & loss of a leg(s), and finally cancer and Alzheimers.    I recommend avoid fried & greasy foods,  sweets/candy, white rice (Elahi or wild rice or Quinoa is OK), white potatoes (sweet potatoes are OK) - anything made from white flour - bagels, doughnuts, rolls, buns, biscuits,white and wheat breads, pizza crust and traditional pasta made of white flour & egg white(vegetarian pasta or spinach or wheat pasta is OK).  Multi-grain bread is OK - like multi-grain flat bread or sandwich thins. Avoid alcohol in excess. Exercise is also important.    Eat all the vegetables you want - avoid meat, especially red meat and dairy - especially cheese.  Cheese is the most concentrated form of trans-fats which is the worst thing to clog up our arteries. Veggie cheese is OK which can be found in the fresh produce section at [employer] or [employer] or [employer]  +++++++++++++++++++++ DASH Eating Plan  DASH stands for "Dietary Approaches to Stop Hypertension."   The DASH eating plan is a healthy eating plan that has been shown to reduce high blood pressure (hypertension). Additional health benefits may include reducing the risk of type 2 diabetes mellitus, heart disease, and stroke. The DASH eating plan may also   help with weight loss. WHAT DO I NEED TO KNOW ABOUT THE DASH EATING PLAN? For the DASH eating plan, you will follow these general guidelines:  Choose foods with a percent daily value for sodium of less than 5% (as listed on the food label).  Use salt-free seasonings or herbs instead of table salt or sea salt.  Check with your health care provider or pharmacist before  using salt substitutes.  Eat lower-sodium products, often labeled as "lower sodium" or "no salt added."  Eat fresh foods.  Eat more vegetables, fruits, and low-fat dairy products.  Choose whole grains. Look for the word "whole" as the first word in the ingredient list.  Choose fish   Limit sweets, desserts, sugars, and sugary drinks.  Choose heart-healthy fats.  Eat veggie cheese   Eat more home-cooked food and less restaurant, buffet, and fast food.  Limit fried foods.  Cook foods using methods other than frying.  Limit canned vegetables. If you do use them, rinse them well to decrease the sodium.  When eating at a restaurant, ask that your food be prepared with less salt, or no salt if possible.                      WHAT FOODS CAN I EAT? Read Dr Joel Fuhrman's books on The End of Dieting & The End of Diabetes  Grains Whole grain or whole wheat bread. Groll rice. Whole grain or whole wheat pasta. Quinoa, bulgur, and whole grain cereals. Low-sodium cereals. Corn or whole wheat flour tortillas. Whole grain cornbread. Whole grain crackers. Low-sodium crackers.  Vegetables Fresh or frozen vegetables (raw, steamed, roasted, or grilled). Low-sodium or reduced-sodium tomato and vegetable juices. Low-sodium or reduced-sodium tomato sauce and paste. Low-sodium or reduced-sodium canned vegetables.   Fruits All fresh, canned (in natural juice), or frozen fruits.  Protein Products  All fish and seafood.  Dried beans, peas, or lentils. Unsalted nuts and seeds. Unsalted canned beans.  Dairy Low-fat dairy products, such as skim or 1% milk, 2% or reduced-fat cheeses, low-fat ricotta or cottage cheese, or plain low-fat yogurt. Low-sodium or reduced-sodium cheeses.  Fats and Oils Tub margarines without trans fats. Light or reduced-fat mayonnaise and salad dressings (reduced sodium). Avocado. Safflower, olive, or canola oils. Natural peanut or almond butter.  Other Unsalted popcorn  and pretzels. The items listed above may not be a complete list of recommended foods or beverages. Contact your dietitian for more options.  +++++++++++++++  WHAT FOODS ARE NOT RECOMMENDED? Grains/ White flour or wheat flour White bread. White pasta. White rice. Refined cornbread. Bagels and croissants. Crackers that contain trans fat.  Vegetables  Creamed or fried vegetables. Vegetables in a . Regular canned vegetables. Regular canned tomato sauce and paste. Regular tomato and vegetable juices.  Fruits Dried fruits. Canned fruit in light or heavy syrup. Fruit juice.  Meat and Other Protein Products Meat in general - RED meat & White meat.  Fatty cuts of meat. Ribs, chicken wings, all processed meats as bacon, sausage, bologna, salami, fatback, hot dogs, bratwurst and packaged luncheon meats.  Dairy Whole or 2% milk, cream, half-and-half, and cream cheese. Whole-fat or sweetened yogurt. Full-fat cheeses or blue cheese. Non-dairy creamers and whipped toppings. Processed cheese, cheese spreads, or cheese curds.  Condiments Onion and garlic salt, seasoned salt, table salt, and sea salt. Canned and packaged gravies. Worcestershire sauce. Tartar sauce. Barbecue sauce. Teriyaki sauce. Soy sauce, including reduced sodium. Steak sauce. Fish sauce. Oyster sauce. Cocktail   sauce. Horseradish. Ketchup and mustard. Meat flavorings and tenderizers. Bouillon cubes. Hot sauce. Tabasco sauce. Marinades. Taco seasonings. Relishes.  Fats and Oils Butter, stick margarine, lard, shortening and bacon fat. Coconut, palm kernel, or palm oils. Regular salad dressings.  Pickles and olives. Salted popcorn and pretzels.  The items listed above may not be a complete list of foods and beverages to avoid.   

## 2018-09-24 NOTE — Progress Notes (Signed)
This very nice 57 y.o. MWM presents for 3 month follow up with HTN, HLD, Pre-Diabetes and Vitamin D Deficiency. Patient was recently started on Wellbutrin for anxiety and self discontinued due to SE's of "crazy dreams". He's also c/o intermittent postural dizziness and a sensation of fluid in his Lt ear.     Patient is treated for HTN & BP has been controlled at home. Today's BP was elevated at 168/92 and rechecked at 157/110. Patient has had no complaints of any cardiac type chest pain, palpitations, dyspnea / orthopnea / PND, dizziness, claudication, or dependent edema.     Hyperlipidemia is controlled with diet & meds. Patient denies myalgias or other med SE's. Last Lipids were not at goal: Lab Results  Component Value Date   CHOL 181 02/12/2018   HDL 37 (L) 02/12/2018   LDLCALC 121 (H) 02/12/2018   TRIG 115 02/12/2018   CHOLHDL 4.9 02/12/2018      Also, the patient has history of PreDiabetes and has had no symptoms of reactive hypoglycemia, diabetic polys, paresthesias or visual blurring.  Last A1c was  Lab Results  Component Value Date   HGBA1C 5.4 04/16/2017      Further, the patient also has history of Vitamin D Deficiency and supplements vitamin D without any suspected side-effects. Last vitamin D was still low: Lab Results  Component Value Date   VD25OH 40 02/12/2018   Current Outpatient Medications on File Prior to Visit  Medication Sig  . ALPRAZolam (XANAX) 1 MG tablet Take 1/2 to 1 tablet 2 to 3 x /day ONLY if needed for Anxiety Attack(s)  . aspirin EC 81 MG tablet Take 81 mg daily by mouth.  Marland Kitchen atorvastatin (LIPITOR) 80 MG tablet Take 80 mg by mouth daily.  . bisoprolol (ZEBETA) 10 MG tablet TAKE 1/2 TO 1 TABLET DAILY FOR BP (Patient taking differently: TAKE 1/2  tab daily 03/12/18)  . Cholecalciferol (VITAMIN D-3) 5000 UNITS TABS Take 10,000 Units by mouth daily.   Marland Kitchen escitalopram (LEXAPRO) 20 MG tablet Take 20 mg by mouth daily.   . famciclovir (FAMVIR) 500 MG tablet  Take 1 tablet (500 mg total) by mouth daily.  . famotidine (PEPCID) 40 MG tablet Take 1/2-1 tab with ibuprofen as needed.  . fluticasone (FLONASE) 50 MCG/ACT nasal spray PLACE 2 SPRAYS INTO BOTH NOSTRILS DAILY. (Patient taking differently: PLACE 2 SPRAYS INTO BOTH NOSTRILS DAILY.  Takes prn.)  . folic acid (FOLVITE) 1 MG tablet Take 1 mg by mouth daily.  . Ibrutinib (IMBRUVICA) 420 MG TABS Take 420 mg by mouth daily.  Marland Kitchen ibuprofen (ADVIL,MOTRIN) 800 MG tablet Take 1 tablet (800 mg total) by mouth daily as needed.  Marland Kitchen levocetirizine (XYZAL) 5 MG tablet TAKE 1 TABLET (5 MG TOTAL) BY MOUTH EVERY EVENING.  . meclizine (ANTIVERT) 25 MG tablet Take 1 tablet (25 mg total) by mouth 3 (three) times daily as needed for dizziness.  Marland Kitchen omeprazole-sodium bicarbonate (ZEGERID) 40-1100 MG capsule Take 1 capsule by mouth daily before breakfast.  . valACYclovir (VALTREX) 1000 MG tablet Take 1 tablet (1,000 mg total) by mouth 3 (three) times daily. Take for 7 days  . atorvastatin (LIPITOR) 80 MG tablet Take 1/2 to 1 tablet as directed for cholesterol (Patient taking differently: Take 40 mg by mouth daily. Take 1/2 to 1 tablet as directed for cholesterol)  . [DISCONTINUED] prochlorperazine (COMPAZINE) 10 MG tablet Take 1 tablet (10 mg total) by mouth every 6 (six) hours as needed (Nausea or vomiting).  No current facility-administered medications on file prior to visit.    Allergies  Allergen Reactions  . Humira [Adalimumab] Other (See Comments)    "blacked out", confused   PMHx:   Past Medical History:  Diagnosis Date  . Allergy   . Anemia   . Anxiety   . Arthritis   . Chest pain, non-cardiac   . COPD (chronic obstructive pulmonary disease) ([place])   . Counseling regarding goals of care 03/27/2018  . Depression   . Diverticulitis   . Fractured pelvis ([place])    fall from ladder  . GERD (gastroesophageal reflux disease)   . Gout   . History of shingles   . Hyperlipidemia   . Iron deficiency anemia  03/13/2018  . Kidney stones   . Other abnormal glucose 10/11/2015  . Rheumatoid arthritis ([place])    hands  . Waldenstrom's macroglobulinemia ([place]) 03/27/2018   Immunization History  Administered Date(s) Administered  . DTaP 06/04/2011  . Hepatitis B 11/26/2010  . Hepatitis B, ped/adol 08/06/2011  . Influenza Inj Mdck Quad With Preservative 09/13/2017, 08/21/2018  . Influenza Split 09/02/2014  . Influenza Whole 09/17/2013  . Influenza, Seasonal, Injecte, Preservative Fre 10/11/2015  . Influenza-Unspecified 09/13/2017  . PPD Test 10/11/2015   Past Surgical History:  Procedure Laterality Date  . COLONOSCOPY    . FINGER SURGERY    . iron infusion    . KNEE SURGERY     right  . ROTATOR CUFF REPAIR  1996   left   FHx:    Reviewed / unchanged  SHx:    Reviewed / unchanged   Systems Review:  Constitutional: Denies fever, chills, wt changes, headaches, insomnia, fatigue, night sweats, change in appetite. Eyes: Denies redness, blurred vision, diplopia, discharge, itchy, watery eyes.  ENT: Denies discharge, congestion, post nasal drip, epistaxis, sore throat, earache, hearing loss, dental pain, tinnitus, vertigo, sinus pain, snoring.  CV: Denies chest pain, palpitations, irregular heartbeat, syncope, dyspnea, diaphoresis, orthopnea, PND, claudication or edema. Respiratory: denies cough, dyspnea, DOE, pleurisy, hoarseness, laryngitis, wheezing.  Gastrointestinal: Denies dysphagia, odynophagia, heartburn, reflux, water brash, abdominal pain or cramps, nausea, vomiting, bloating, diarrhea, constipation, hematemesis, melena, hematochezia  or hemorrhoids. Genitourinary: Denies dysuria, frequency, urgency, nocturia, hesitancy, discharge, hematuria or flank pain. Musculoskeletal: Denies arthralgias, myalgias, stiffness, jt. swelling, pain, limping or strain/sprain.  Skin: Denies pruritus, rash, hives, warts, acne, eczema or change in skin lesion(s). Neuro: No weakness, tremor, incoordination,  spasms, paresthesia or pain. Psychiatric: Denies confusion, memory loss or sensory loss. Endo: Denies change in weight, skin or hair change.  Heme/Lymph: No excessive bleeding, bruising or enlarged lymph nodes.  Physical Exam  BP (!) 168/92   Pulse 64   Temp (!) 97.5 F (36.4 C)   Resp 16   Ht 6' (1.829 m)   Wt 175 lb 12.8 oz (79.7 kg)   BMI 23.84 kg/m   Appears  well nourished, well groomed  and in no distress.  Eyes: PERRLA, EOMs, conjunctiva no swelling or erythema. Sinuses: (+) frontal/maxillary tenderness ENT/Mouth: EAC's clear, Lt Tm dull/retracted & Rt - Nl. . Nares clear w/o erythema, swelling, exudates. Oropharynx clear without erythema or exudates. Oral hygiene is good. Tongue normal, non obstructing. Hearing intact.  Neck: Supple. Thyroid not palpable. Car 2+/2+ without bruits, nodes or JVD. Chest: Respirations nl with BS clear & equal w/o rales, rhonchi, wheezing or stridor.  Cor: Heart sounds normal w/ regular rate and rhythm without sig. murmurs, gallops, clicks or rubs. Peripheral pulses normal and equal  without  edema.  Abdomen: Soft & bowel sounds normal. Non-tender w/o guarding, rebound, hernias, masses or organomegaly.  Lymphatics: Unremarkable.  Musculoskeletal: Full ROM all peripheral extremities, joint stability, 5/5 strength and normal gait.  Skin: Warm, dry without exposed rashes, lesions or ecchymosis apparent.  Neuro: Cranial nerves intact, reflexes equal bilaterally. Sensory-motor testing grossly intact. Tendon reflexes grossly intact.  Pysch: Alert & oriented x 3.  Insight and judgement nl & appropriate. No ideations.  Assessment and Plan:  1. Essential hypertension  -  Add losartan (COZAAR) 100 MG tablet; Take 1 tablet at bedtime for BP  Dispense: 90 tablet; Refill: 1  -  monitor blood pressure at home.  - Continue DASH diet.  Reminder to go to the ER if any CP,  SOB, nausea, dizziness, severe HA, changes vision/speech.  - COMPLETE METABOLIC  PANEL WITH GFR - Magnesium  2. Hyperlipidemia, mixed  - Continue diet/meds, exercise,& lifestyle modifications.  - Continue monitor periodic cholesterol/liver & renal functions   - Lipid panel  3. Abnormal glucose  - Continue diet, exercise,  - lifestyle modifications.  - Monitor appropriate labs.  - Hemoglobin A1c - Insulin, random  4. Vitamin D deficiency  - Continue supplementation.  - VITAMIN D 25 Hydroxyl  5. Gastroesophageal reflux disease,  - COMPLETE METABOLIC PANEL WITH GFR  6. Medication management  - COMPLETE METABOLIC PANEL WITH GFR - Magnesium - Lipid panel - Hemoglobin A1c - Insulin, random - VITAMIN D 25 Hydroxyl  7. Chronic anxiety  - busPIRone (BUSPAR) 5 MG tablet; Take 1/2 to 1 tablet 3 x /day with meals for Anxiety  Dispense: 90 tablet; Refill: 2  8. Subacute pansinusitis  - levofloxacin (LEVAQUIN) 500 MG tablet; Take 1 tablet daily with food for infection  Dispense: 15 tablet; Refill: 1  - predniSONE (DELTASONE) 20 MG tablet; 1 tab 3 x day for 3 days, then 1 tab 2 x day for 3 days, then 1 tab 1 x day for 5 days  Dispense: 20 tablet; Refill: 0  9. Suppurative otitis media of left ear  - levofloxacin (LEVAQUIN) 500 MG tablet; Take 1 tablet daily with food for infection  Dispense: 15 tablet; Refill: 1       Discussed  regular exercise, BP monitoring, weight control to achieve/maintain BMI less than 25 and discussed med and SE's. Recommended labs to assess and monitor clinical status with further disposition pending results of labs. Over 30 minutes of exam, counseling, chart review was performed.

## 2018-09-25 LAB — COMPLETE METABOLIC PANEL WITH GFR
AG Ratio: 2 (calc) (ref 1.0–2.5)
ALBUMIN MSPROF: 4.5 g/dL (ref 3.6–5.1)
ALKALINE PHOSPHATASE (APISO): 84 U/L (ref 40–115)
ALT: 24 U/L (ref 9–46)
AST: 25 U/L (ref 10–35)
BUN: 14 mg/dL (ref 7–25)
CO2: 28 mmol/L (ref 20–32)
CREATININE: 0.98 mg/dL (ref 0.70–1.33)
Calcium: 9.7 mg/dL (ref 8.6–10.3)
Chloride: 103 mmol/L (ref 98–110)
GFR, EST AFRICAN AMERICAN: 97 mL/min/{1.73_m2} (ref >=60)
GFR, Est Non African American: 84 mL/min/{1.73_m2} (ref >=60)
GLUCOSE: 78 mg/dL (ref 65–99)
Globulin: 2.3 g/dL (calc) (ref 1.9–3.7)
Potassium: 3.7 mmol/L (ref 3.5–5.3)
Sodium: 141 mmol/L (ref 135–146)
TOTAL PROTEIN: 6.8 g/dL (ref 6.1–8.1)
Total Bilirubin: 0.7 mg/dL (ref 0.2–1.2)

## 2018-09-25 LAB — MAGNESIUM: MAGNESIUM: 2 mg/dL (ref 1.5–2.5)

## 2018-09-25 LAB — LIPID PANEL
Cholesterol: 190 mg/dL (ref <200)
HDL: 58 mg/dL (ref >40)
LDL CHOLESTEROL (CALC): 116 mg/dL — AB
Non-HDL Cholesterol (Calc): 132 mg/dL (calc) — ABNORMAL HIGH (ref <130)
Total CHOL/HDL Ratio: 3.3 (calc) (ref <5.0)
Triglycerides: 65 mg/dL (ref <150)

## 2018-09-25 LAB — INSULIN, RANDOM: Insulin: <1 u[IU]/mL — ABNORMAL LOW (ref 2.0–19.6)

## 2018-09-25 LAB — HEMOGLOBIN A1C
HEMOGLOBIN A1C: 5.3 %{Hb} (ref <5.7)
Mean Plasma Glucose: 105 (calc)
eAG (mmol/L): 5.8 (calc)

## 2018-09-25 LAB — VITAMIN D 25 HYDROXY (VIT D DEFICIENCY, FRACTURES): Vit D, 25-Hydroxy: 48 ng/mL (ref 30–100)

## 2018-09-28 ENCOUNTER — Encounter: Payer: Self-pay | Admitting: Internal Medicine

## 2018-10-03 MED FILL — IMBRUVICA 420 MG TAB: 420 | 28 days supply | Qty: 28 | Fill #4

## 2018-10-07 ENCOUNTER — Ambulatory Visit (HOSPITAL_COMMUNITY)
Admission: RE | Admit: 2018-10-07 | Discharge: 2018-10-07 | Disposition: A | Discharge status: Home / Self Care | Payer: 59 | Source: Ambulatory Visit | Attending: Adult Health | Admitting: Adult Health

## 2018-10-07 ENCOUNTER — Ambulatory Visit: Payer: 59 | Admitting: Adult Health

## 2018-10-07 ENCOUNTER — Encounter: Payer: Self-pay | Admitting: Adult Health

## 2018-10-07 VITALS — BP 110/76 | HR 57 | Temp 97.5°F | Ht 72.0 in | Wt 178.0 lb

## 2018-10-07 DIAGNOSIS — M25512 Pain in left shoulder: Secondary | ICD-10-CM | POA: Insufficient documentation

## 2018-10-07 DIAGNOSIS — M19012 Primary osteoarthritis, left shoulder: Secondary | ICD-10-CM | POA: Diagnosis not present

## 2018-10-07 DIAGNOSIS — I10 Essential (primary) hypertension: Secondary | ICD-10-CM

## 2018-10-07 MED ORDER — TIZANIDINE HCL 4 MG PO TABS: 4.0000 mg | ORAL_TABLET | Freq: Four times a day (QID) | ORAL | 0 refills | Status: DC | PRN

## 2018-10-07 NOTE — Patient Instructions (Addendum)
HYPERTENSION INFORMATION  Monitor your blood pressure at home, please keep a record and bring that in with you to your next office visit.   Go to the ER if any CP, SOB, nausea, dizziness, severe HA, changes vision/speech  Due to a recent study, SPRINT, we have changed our goal for the systolic or top blood pressure number. Ideally we want your top number at 120.  In the [facility] Trial, 5000 people were randomized to a goal BP of 120 and 5000 people were randomized to a goal BP of less than 140. The patients with the goal BP at 120 had LESS DEMENTIA, LESS HEART ATTACKS, AND LESS STROKES, AS WELL AS OVERALL DECREASED MORTALITY OR DEATH RATE.   If you are willing, our goal BP is the top number of 120.  Your most recent BP:     Take your medications faithfully as instructed. Maintain a healthy weight. Get at least 150 minutes of aerobic exercise per week. Minimize salt intake. Minimize alcohol intake  DASH Eating Plan DASH stands for "Dietary Approaches to Stop Hypertension." The DASH eating plan is a healthy eating plan that has been shown to reduce high blood pressure (hypertension). Additional health benefits may include reducing the risk of type 2 diabetes mellitus, heart disease, and stroke. The DASH eating plan may also help with weight loss. WHAT DO I NEED TO KNOW ABOUT THE DASH EATING PLAN? For the DASH eating plan, you will follow these general guidelines:  Choose foods with a percent daily value for sodium of less than 5% (as listed on the food label).  Use salt-free seasonings or herbs instead of table salt or sea salt.  Check with your health care provider or pharmacist before using salt substitutes.  Eat lower-sodium products, often labeled as "lower sodium" or "no salt added."  Eat fresh foods.  Eat more vegetables, fruits, and low-fat dairy products.  Choose whole grains. Look for the word "whole" as the first word in the ingredient list.  Choose fish and skinless  chicken or [place] more often than red meat. Limit fish, poultry, and meat to 6 oz (170 g) each day.  Limit sweets, desserts, sugars, and sugary drinks.  Choose heart-healthy fats.  Limit cheese to 1 oz (28 g) per day.  Eat more home-cooked food and less restaurant, buffet, and fast food.  Limit fried foods.  Cook foods using methods other than frying.  Limit canned vegetables. If you do use them, rinse them well to decrease the sodium.  When eating at a restaurant, ask that your food be prepared with less salt, or no salt if possible. WHAT FOODS CAN I EAT? Seek help from a dietitian for individual calorie needs. Grains Whole grain or whole wheat bread. [place] rice. Whole grain or whole wheat pasta. Quinoa, bulgur, and whole grain cereals. Low-sodium cereals. Corn or whole wheat flour tortillas. Whole grain cornbread. Whole grain crackers. Low-sodium crackers. Vegetables Fresh or frozen vegetables (raw, steamed, roasted, or grilled). Low-sodium or reduced-sodium tomato and vegetable juices. Low-sodium or reduced-sodium tomato sauce and paste. Low-sodium or reduced-sodium canned vegetables.  Fruits All fresh, canned (in natural juice), or frozen fruits. Meat and Other Protein Products Ground beef (85% or leaner), grass-fed beef, or beef trimmed of fat. Skinless chicken or [place]. Ground chicken or [place]. Pork trimmed of fat. All fish and seafood. Eggs. Dried beans, peas, or lentils. Unsalted nuts and seeds. Unsalted canned beans. Dairy Low-fat dairy products, such as skim or 1% milk, 2% or  reduced-fat cheeses, low-fat ricotta or cottage cheese, or plain low-fat yogurt. Low-sodium or reduced-sodium cheeses. Fats and Oils Tub margarines without trans fats. Light or reduced-fat mayonnaise and salad dressings (reduced sodium). Avocado. Safflower, olive, or canola oils. Natural peanut or almond butter. Other Unsalted popcorn and pretzels. The items listed above may not be a complete list of  recommended foods or beverages. Contact your dietitian for more options. WHAT FOODS ARE NOT RECOMMENDED? Grains White bread. White pasta. White rice. Refined cornbread. Bagels and croissants. Crackers that contain trans fat. Vegetables Creamed or fried vegetables. Vegetables in a cheese sauce. Regular canned vegetables. Regular canned tomato sauce and paste. Regular tomato and vegetable juices. Fruits Dried fruits. Canned fruit in light or heavy syrup. Fruit juice. Meat and Other Protein Products Fatty cuts of meat. Ribs, chicken wings, bacon, sausage, bologna, salami, chitterlings, fatback, hot dogs, bratwurst, and packaged luncheon meats. Salted nuts and seeds. Canned beans with salt. Dairy Whole or 2% milk, cream, half-and-half, and cream cheese. Whole-fat or sweetened yogurt. Full-fat cheeses or blue cheese. Nondairy creamers and whipped toppings. Processed cheese, cheese spreads, or cheese curds. Condiments Onion and garlic salt, seasoned salt, table salt, and sea salt. Canned and packaged gravies. Worcestershire sauce. Tartar sauce. Barbecue sauce. Teriyaki sauce. Soy sauce, including reduced sodium. Steak sauce. Fish sauce. Oyster sauce. Cocktail sauce. Horseradish. Ketchup and mustard. Meat flavorings and tenderizers. Bouillon cubes. Hot sauce. Tabasco sauce. Marinades. Taco seasonings. Relishes. Fats and Oils Butter, stick margarine, lard, shortening, ghee, and bacon fat. Coconut, palm kernel, or palm oils. Regular salad dressings. Other Pickles and olives. Salted popcorn and pretzels. The items listed above may not be a complete list of foods and beverages to avoid. Contact your dietitian for more information. WHERE CAN I FIND MORE INFORMATION? National Heart, Lung, and Blood Institute: travelstabloid.com Document Released: 11/01/2011 Document Revised: 03/29/2014 Document Reviewed: 09/16/2013 [facility] Patient Information 2015 [place], [place]. This  information is not intended to replace advice given to you by your health care provider. Make sure you discuss any questions you have with your health care provider.    Tizanidine tablets or capsules What is this medicine? TIZANIDINE (tye ZAN i deen) helps to relieve muscle spasms. It may be used to help in the treatment of multiple sclerosis and spinal cord injury. This medicine may be used for other purposes; ask your health care provider or pharmacist if you have questions. COMMON BRAND NAME(S): Zanaflex What should I tell my health care provider before I take this medicine? They need to know if you have any of these conditions: -kidney disease -liver disease -low blood pressure -mental disorder -an unusual or allergic reaction to tizanidine, other medicines, lactose (tablets only), foods, dyes, or preservatives -pregnant or trying to get pregnant -breast-feeding How should I use this medicine? Take this medicine by mouth with a full glass of water. Take this medicine on an empty stomach, at least 30 minutes before or 2 hours after food. Do not take with food unless you talk with your doctor. Follow the directions on the prescription label. Take your medicine at regular intervals. Do not take your medicine more often than directed. Do not stop taking except on your doctor's advice. Suddenly stopping the medicine can be very dangerous. Talk to your pediatrician regarding the use of this medicine in children. Patients over 42 years old may have a stronger reaction and need a smaller dose. Overdosage: If you think you have taken too much of this medicine contact a poison control center  or emergency room at once. NOTE: This medicine is only for you. Do not share this medicine with others. What if I miss a dose? If you miss a dose, take it as soon as you can. If it is almost time for your next dose, take only that dose. Do not take double or extra doses. What may interact with this  medicine? Do not take this medicine with any of the following medications: -ciprofloxacin -cisapride -dofetilide -dronedarone -fluvoxamine -narcotic medicines for cough -pimozide -thiabendazole -thioridazine -ziprasidone This medicine may also interact with the following medications: -acyclovir -alcohol -antihistamines for allergy, cough and cold -baclofen -certain antibiotics like levofloxacin, ofloxacin -certain medicines for anxiety or sleep -certain medicines for blood pressure, heart disease, irregular heart beat -certain medicines for depression like amitriptyline, fluoxetine, sertraline -certain medicines for seizures like phenobarbital, primidone -certain medicines for stomach problems like cimetidine, famotidine -male hormones, like estrogens or progestins and birth control pills, patches, rings, or injections -general anesthetics like halothane, isoflurane, methoxyflurane, propofol -local anesthetics like lidocaine, pramoxine, tetracaine -medicines that relax muscles for surgery -narcotic medicines for pain -other medicines that prolong the QT interval (cause an abnormal heart rhythm) -phenothiazines like chlorpromazine, mesoridazine, prochlorperazine -ticlopidine -zileuton This list may not describe all possible interactions. Give your health care provider a list of all the medicines, herbs, non-prescription drugs, or dietary supplements you use. Also tell them if you smoke, drink alcohol, or use illegal drugs. Some items may interact with your medicine. What should I watch for while using this medicine? Tell your doctor or health care professional if your symptoms do not start to get better or if they get worse. You may get drowsy or dizzy. Do not drive, use machinery, or do anything that needs mental alertness until you know how this medicine affects you. Do not stand or sit up quickly, especially if you are an older patient. This reduces the risk of dizzy or  fainting spells. Alcohol may interfere with the effect of this medicine. Avoid alcoholic drinks. If you are taking another medicine that also causes drowsiness, you may have more side effects. Give your health care provider a list of all medicines you use. Your doctor will tell you how much medicine to take. Do not take more medicine than directed. Call emergency for help if you have problems breathing or unusual sleepiness. Your mouth may get dry. Chewing sugarless gum or sucking hard candy, and drinking plenty of water may help. Contact your doctor if the problem does not go away or is severe. What side effects may I notice from receiving this medicine? Side effects that you should report to your doctor or health care professional as soon as possible: -allergic reactions like skin rash, itching or hives, swelling of the face, lips, or tongue -breathing problems -hallucinations -signs and symptoms of liver injury like dark yellow or [place] urine; general ill feeling or flu-like symptoms; light-colored stools; loss of appetite; nausea; right upper quadrant belly pain; unusually weak or tired; yellowing of the eyes or skin -signs and symptoms of low blood pressure like dizziness; feeling faint or lightheaded, falls; unusually weak or tired -unusually slow heartbeat -unusually weak or tired Side effects that usually do not require medical attention (report to your doctor or health care professional if they continue or are bothersome): -blurred vision -constipation -dizziness -dry mouth -tiredness This list may not describe all possible side effects. Call your doctor for medical advice about side effects. You may report side effects to FDA at 1-800-FDA-1088. Where  should I keep my medicine? Keep out of the reach of children. Store at room temperature between 15 and 30 degrees C (59 and 86 degrees F). Throw away any unused medicine after the expiration date. NOTE: This sheet is a summary. It may not  cover all possible information. If you have questions about this medicine, talk to your doctor, pharmacist, or health care provider.  2018 Elsevier/Gold Standard (2015-08-23 13:52:12)

## 2018-10-07 NOTE — Progress Notes (Signed)
Assessment and Plan:  Edwin Webster was seen today for follow-up.  Diagnoses and all orders for this visit:  Essential hypertension Excellent control with addition of new medication Monitor blood pressure at home; call if consistently over 130/80 Continue DASH diet.   Reminder to go to the ER if any CP, SOB, nausea, dizziness, severe HA, changes vision/speech, left arm numbness and tingling and jaw pain.  Acute pain of left shoulder, fall Continue ibuprofen, rest, application of wet heat as this seems to improve symptoms XR to r/o fracture, then consider prednisone taper Will refer to ortho for evaluation if symptoms not improving -     DG Shoulder Left; Future -     tiZANidine (ZANAFLEX) 4 MG tablet; Take 1 tablet (4 mg total) by mouth every 6 (six) hours as needed for muscle spasms.  Further disposition pending results of labs. Discussed med's effects and SE's.   Over 15 minutes of exam, counseling, chart review, and critical decision making was performed.   Future Appointments  Date Time Provider [place]  10/21/2018 10:00 AM [facility] None  10/21/2018 10:30 AM Ennever, Rudell Cobb, MD [facility] None    ------------------------------------------------------------------------------------------------------------------   HPI BP 110/76   Pulse (!) 57   Temp (!) 97.5 F (36.4 C)   Ht 6' (1.829 m)   Wt 178 lb (80.7 kg)   SpO2 95%   BMI 24.14 kg/m   57 y.o.male with htn, RA, Waldenstrom's macroglobulinemia presents for 2 week follow up for blood pressure. At the last appointment on 10/30 BP was noted to be 168/92, P 64 on bisoprolol 10 mg. Losartan 100 mg daily was added.  His blood pressure has been controlled at home, today their BP is BP: 110/76   He denies chest pain, shortness of breath, dizziness, fatigue, LE edema.   He also c/o left shoulder pain with shoulder x 4 days, reports he tripped and landed shoulder first. Reports pain with shoulder abduction, 10/10,  sharp, radiating pain through deltoid and biceps. Reports has taken some ibuprofen which is helpful. Cannot lift arm without assistance. Symptoms improve with application of wet heat, can reach over head when in shower. Denies numbness, tingling.   Hx of R rotator cuff surgery in 1996.   Past Medical History:  Diagnosis Date  . Allergy   . Anemia   . Anxiety   . Arthritis   . Chest pain, non-cardiac   . COPD (chronic obstructive pulmonary disease) ([place])   . Counseling regarding goals of care 03/27/2018  . Depression   . Diverticulitis   . Fractured pelvis ([place])    fall from ladder  . GERD (gastroesophageal reflux disease)   . Gout   . History of shingles   . Hyperlipidemia   . Iron deficiency anemia 03/13/2018  . Kidney stones   . Other abnormal glucose 10/11/2015  . Rheumatoid arthritis ([place])    hands  . Waldenstrom's macroglobulinemia ([place]) 03/27/2018     Allergies  Allergen Reactions  . Humira [Adalimumab] Other (See Comments)    "blacked out", confused    Current Outpatient Medications on File Prior to Visit  Medication Sig  . ALPRAZolam (XANAX) 1 MG tablet Take 1/2 to 1 tablet 2 to 3 x /day ONLY if needed for Anxiety Attack(s)  . aspirin EC 81 MG tablet Take 81 mg daily by mouth.  Marland Kitchen atorvastatin (LIPITOR) 80 MG tablet Take 80 mg by mouth daily.  . bisoprolol (ZEBETA) 10 MG tablet TAKE 1/2 TO 1 TABLET  DAILY FOR BP (Patient taking differently: TAKE 1/2  tab daily 03/12/18)  . busPIRone (BUSPAR) 5 MG tablet Take 1/2 to 1 tablet 3 x /day with meals for Anxiety  . Cholecalciferol (VITAMIN D-3) 5000 UNITS TABS Take 10,000 Units by mouth daily.   Marland Kitchen escitalopram (LEXAPRO) 20 MG tablet Take 20 mg by mouth daily.   . famciclovir (FAMVIR) 500 MG tablet Take 1 tablet (500 mg total) by mouth daily.  . famotidine (PEPCID) 40 MG tablet Take 1/2-1 tab with ibuprofen as needed.  . fluticasone (FLONASE) 50 MCG/ACT nasal spray PLACE 2 SPRAYS INTO BOTH NOSTRILS DAILY. (Patient taking  differently: PLACE 2 SPRAYS INTO BOTH NOSTRILS DAILY.  Takes prn.)  . folic acid (FOLVITE) 1 MG tablet Take 1 mg by mouth daily.  . Ibrutinib (IMBRUVICA) 420 MG TABS Take 420 mg by mouth daily.  Marland Kitchen ibuprofen (ADVIL,MOTRIN) 800 MG tablet Take 1 tablet (800 mg total) by mouth daily as needed.  Marland Kitchen levocetirizine (XYZAL) 5 MG tablet TAKE 1 TABLET (5 MG TOTAL) BY MOUTH EVERY EVENING.  Marland Kitchen levofloxacin (LEVAQUIN) 500 MG tablet Take 1 tablet daily with food for infection  . losartan (COZAAR) 100 MG tablet Take 1 tablet at bedtime for BP  . meclizine (ANTIVERT) 25 MG tablet Take 1 tablet (25 mg total) by mouth 3 (three) times daily as needed for dizziness.  Marland Kitchen omeprazole-sodium bicarbonate (ZEGERID) 40-1100 MG capsule Take 1 capsule by mouth daily before breakfast.  . predniSONE (DELTASONE) 20 MG tablet 1 tab 3 x day for 3 days, then 1 tab 2 x day for 3 days, then 1 tab 1 x day for 5 days  . valACYclovir (VALTREX) 1000 MG tablet Take 1 tablet (1,000 mg total) by mouth 3 (three) times daily. Take for 7 days  . [DISCONTINUED] prochlorperazine (COMPAZINE) 10 MG tablet Take 1 tablet (10 mg total) by mouth every 6 (six) hours as needed (Nausea or vomiting).  Marland Kitchen atorvastatin (LIPITOR) 80 MG tablet Take 1/2 to 1 tablet as directed for cholesterol (Patient taking differently: Take 40 mg by mouth daily. Take 1/2 to 1 tablet as directed for cholesterol)   No current facility-administered medications on file prior to visit.     ROS: all negative except above.   Physical Exam:  BP 110/76   Pulse (!) 57   Temp (!) 97.5 F (36.4 C)   Ht 6' (1.829 m)   Wt 178 lb (80.7 kg)   SpO2 95%   BMI 24.14 kg/m   General Appearance: Well nourished, in no apparent distress. Eyes: PERRLA, conjunctiva no swelling or erythema ENT/Mouth: Hearing normal.  Neck: Supple Respiratory: Respiratory effort normal, BS equal bilaterally without rales, rhonchi, wheezing or stridor.  Cardio: RRR with no MRGs. Brisk peripheral pulses  without edema.  Abdomen: Soft, + BS.  Non tender Lymphatics: Non tender without lymphadenopathy.  Musculoskeletal: left shoulder without obvious visible/palpable deformity; clavicle intact, vague generalized tenderness through deltoid, biceps. Active and passive ROM limited due to pain, most notably with flexion and with minimal abduction.  Skin: Warm, dry without rashes, lesions, ecchymosis.  Neuro: Cranial nerves intact. Normal muscle tone, no cerebellar symptoms. Sensation intact.  Psych: Awake and oriented X 3, normal affect, Insight and Judgment appropriate.     [name], NP 3:02 PM [facility]

## 2018-10-08 ENCOUNTER — Other Ambulatory Visit: Payer: Self-pay | Admitting: Adult Health

## 2018-10-08 DIAGNOSIS — J0140 Acute pansinusitis, unspecified: Secondary | ICD-10-CM

## 2018-10-08 MED ORDER — PREDNISONE 20 MG PO TABS: ORAL_TABLET | ORAL | 0 refills | Status: DC

## 2018-10-15 ENCOUNTER — Other Ambulatory Visit: Payer: Self-pay | Admitting: Physician Assistant

## 2018-10-16 ENCOUNTER — Other Ambulatory Visit: Payer: Self-pay | Admitting: Internal Medicine

## 2018-10-16 DIAGNOSIS — F419 Anxiety disorder, unspecified: Secondary | ICD-10-CM

## 2018-10-21 ENCOUNTER — Inpatient Hospital Stay: Payer: 59

## 2018-10-21 ENCOUNTER — Other Ambulatory Visit: Payer: Self-pay

## 2018-10-21 ENCOUNTER — Inpatient Hospital Stay: Payer: 59 | Attending: Hematology & Oncology | Admitting: Hematology & Oncology

## 2018-10-21 VITALS — BP 137/70 | HR 55 | Temp 97.7°F | Resp 20 | Wt 175.8 lb

## 2018-10-21 DIAGNOSIS — R5382 Chronic fatigue, unspecified: Secondary | ICD-10-CM

## 2018-10-21 DIAGNOSIS — R5383 Other fatigue: Secondary | ICD-10-CM | POA: Insufficient documentation

## 2018-10-21 DIAGNOSIS — C880 Waldenstrom macroglobulinemia: Secondary | ICD-10-CM | POA: Diagnosis not present

## 2018-10-21 DIAGNOSIS — M069 Rheumatoid arthritis, unspecified: Secondary | ICD-10-CM

## 2018-10-21 DIAGNOSIS — D509 Iron deficiency anemia, unspecified: Secondary | ICD-10-CM | POA: Diagnosis not present

## 2018-10-21 LAB — CBC WITH DIFFERENTIAL (CANCER CENTER ONLY)
Abs Immature Granulocytes: 0.32 10*3/uL — ABNORMAL HIGH (ref 0.00–0.07)
BASOS PCT: 0 %
Basophils Absolute: 0 10*3/uL (ref 0.0–0.1)
EOS ABS: 0.1 10*3/uL (ref 0.0–0.5)
Eosinophils Relative: 2 %
HCT: 43.1 % (ref 39.0–52.0)
Hemoglobin: 13.9 g/dL (ref 13.0–17.0)
Immature Granulocytes: 5 %
Lymphocytes Relative: 9 %
Lymphs Abs: 0.7 10*3/uL (ref 0.7–4.0)
MCH: 32.1 pg (ref 26.0–34.0)
MCHC: 32.3 g/dL (ref 30.0–36.0)
MCV: 99.5 fL (ref 80.0–100.0)
MONO ABS: 0.9 10*3/uL (ref 0.1–1.0)
MONOS PCT: 13 %
NEUTROS PCT: 71 %
Neutro Abs: 5 10*3/uL (ref 1.7–7.7)
PLATELETS: 215 10*3/uL (ref 150–400)
RBC: 4.33 MIL/uL (ref 4.22–5.81)
RDW: 13.3 % (ref 11.5–15.5)
WBC Count: 7 10*3/uL (ref 4.0–10.5)
nRBC: 0 % (ref 0.0–0.2)

## 2018-10-21 LAB — CMP (CANCER CENTER ONLY)
ALT: 33 U/L (ref 10–47)
AST: 27 U/L (ref 11–38)
Albumin: 3.9 g/dL (ref 3.5–5.0)
Alkaline Phosphatase: 85 U/L — ABNORMAL HIGH (ref 26–84)
Anion gap: 5 (ref 5–15)
BUN: 14 mg/dL (ref 7–22)
CALCIUM: 9.7 mg/dL (ref 8.0–10.3)
CO2: 31 mmol/L (ref 18–33)
CREATININE: 1.1 mg/dL (ref 0.60–1.20)
Chloride: 104 mmol/L (ref 98–108)
Glucose, Bld: 81 mg/dL (ref 73–118)
Potassium: 4 mmol/L (ref 3.3–4.7)
Sodium: 140 mmol/L (ref 128–145)
TOTAL PROTEIN: 6.8 g/dL (ref 6.4–8.1)
Total Bilirubin: 0.9 mg/dL (ref 0.2–1.6)

## 2018-10-21 NOTE — Progress Notes (Signed)
Hematology and Oncology Follow Up Visit  Edwin Webster [id number] [birth year] 57 y.o. 10/21/2018   Principle Diagnosis:  Waldenstrom's macroglobulinemia Iron deficiency anemia  Current Therapy:   Rituxan/bendamustine-s/p cycle 2 -- d/c on 06/11/2018 Imbruvica 420 mg po q day -- started on 06/14/2018 IV iron as indicated - last received in April 2019   Interim History:  Edwin Webster is here today for follow-up.  He apparently fell recently.  He hurt his left shoulder.  He is able to move it but it does have some decreased range of motion.  He does have an orthopedist.  He will see his orthopedist if the shoulder does not improve.  His Waldenstrm's has slowly improved.  More last saw him back in September, the IgM level was 500 mg/dL.  His M spike was 0.4 g/dL.  He still has some fatigue.  His rheumatoid arthritis is actually doing little bit better.  I think this might be secondary to his Imbruvica which is toxic to be lymphocytes which are implicated in rheumatoid arthritis development.  His appetite is all right.  He and his wife will be going to her mom's place for Thanksgiving.  He has had no fever.  He has had no change in bowel or bladder habits.  He has had no rashes.  Overall, his performance status is ECOG 1.    Medications:  Allergies as of 10/21/2018      Reactions   Humira [adalimumab] Other (See Comments)   "blacked out", confused      Medication List        Accurate as of 10/21/18 10:53 AM. Always use your most recent med list.          ALPRAZolam 1 MG tablet Commonly known as:  XANAX Take 1/2 to 1 tablet 2 to 3 x /day ONLY if needed for Anxiety Attack(s)   aspirin EC 81 MG tablet Take 81 mg daily by mouth.   atorvastatin 80 MG tablet Commonly known as:  LIPITOR Take 80 mg by mouth daily.   bisoprolol 10 MG tablet Commonly known as:  ZEBETA TAKE 1/2 TO 1 TABLET DAILY FOR BP   busPIRone 5 MG tablet Commonly known as:  BUSPAR TAKE 1/2 TO 1  TABLET 3 X /DAY WITH MEALS FOR ANXIETY   escitalopram 20 MG tablet Commonly known as:  LEXAPRO Take 20 mg by mouth daily.   famciclovir 500 MG tablet Commonly known as:  FAMVIR Take 1 tablet (500 mg total) by mouth daily.   famotidine 40 MG tablet Commonly known as:  PEPCID Take 1/2-1 tab with ibuprofen as needed.   fluticasone 50 MCG/ACT nasal spray Commonly known as:  FLONASE PLACE 2 SPRAYS INTO BOTH NOSTRILS DAILY.   folic acid 1 MG tablet Commonly known as:  FOLVITE Take 1 mg by mouth daily.   Ibrutinib 420 MG Tabs Take 420 mg by mouth daily.   ibuprofen 800 MG tablet Commonly known as:  ADVIL,MOTRIN Take 1 tablet (800 mg total) by mouth daily as needed.   levocetirizine 5 MG tablet Commonly known as:  XYZAL TAKE 1 TABLET (5 MG TOTAL) BY MOUTH EVERY EVENING.   losartan 100 MG tablet Commonly known as:  COZAAR Take 1 tablet at bedtime for BP   meclizine 25 MG tablet Commonly known as:  ANTIVERT Take 1 tablet (25 mg total) by mouth 3 (three) times daily as needed for dizziness.   omeprazole-sodium bicarbonate 40-1100 MG capsule Commonly known as:  ZEGERID Take 1 capsule by  mouth daily before breakfast.   tiZANidine 4 MG tablet Commonly known as:  ZANAFLEX Take 1 tablet (4 mg total) by mouth every 6 (six) hours as needed for muscle spasms.   Vitamin D-3 125 MCG (5000 UT) Tabs Take 10,000 Units by mouth daily.       Allergies:  Allergies  Allergen Reactions  . Humira [Adalimumab] Other (See Comments)    "blacked out", confused    Past Medical History, Surgical history, Social history, and Family History were reviewed and updated.  Review of Systems: Review of Systems  Constitutional: Negative.   HENT: Negative.   Eyes: Negative.   Respiratory: Negative.   Cardiovascular: Negative.   Gastrointestinal: Negative.   Genitourinary: Negative.   Musculoskeletal: Negative.   Skin: Negative.   Neurological: Negative.   Endo/Heme/Allergies: Negative.    Psychiatric/Behavioral: Negative.       Physical Exam:  weight is 175 lb 12 oz (79.7 kg). His oral temperature is 97.7 F (36.5 C). His blood pressure is 137/70 and his pulse is 55 (abnormal). His respiration is 20 and oxygen saturation is 100%.   Wt Readings from Last 3 Encounters:  10/21/18 175 lb 12 oz (79.7 kg)  10/07/18 178 lb (80.7 kg)  09/24/18 175 lb 12.8 oz (79.7 kg)    Physical Exam  Constitutional: He is oriented to person, place, and time.  HENT:  Head: Normocephalic and atraumatic.  Mouth/Throat: Oropharynx is clear and moist.  Eyes: Pupils are equal, round, and reactive to light. EOM are normal.  Neck: Normal range of motion.  Cardiovascular: Normal rate, regular rhythm and normal heart sounds.  Pulmonary/Chest: Effort normal and breath sounds normal.  Abdominal: Soft. Bowel sounds are normal.  Musculoskeletal: Normal range of motion. He exhibits no edema, tenderness or deformity.  Lymphadenopathy:    He has no cervical adenopathy.  Neurological: He is alert and oriented to person, place, and time.  Skin: Skin is warm and dry. No rash noted. No erythema.  Psychiatric: He has a normal mood and affect. His behavior is normal. Judgment and thought content normal.  Vitals reviewed.    Lab Results  Component Value Date   WBC 7.0 10/21/2018   HGB 13.9 10/21/2018   HCT 43.1 10/21/2018   MCV 99.5 10/21/2018   PLT 215 10/21/2018   Lab Results  Component Value Date   FERRITIN 556 (H) 07/09/2018   IRON 109 07/09/2018   TIBC 274 07/09/2018   UIBC 164 07/09/2018   IRONPCTSAT 40 (L) 07/09/2018   Lab Results  Component Value Date   RETICCTPCT 0.9 02/12/2018   RBC 4.33 10/21/2018   RETICCTABS 33,120 02/12/2018   Lab Results  Component Value Date   KPAFRELGTCHN 30.8 (H) 08/20/2018   LAMBDASER 12.8 08/20/2018   KAPLAMBRATIO 2.41 (H) 08/20/2018   Lab Results  Component Value Date   IGGSERUM 505 (L) 08/20/2018   IGA 31 (L) 08/20/2018   IGMSERUM 503 (H)  08/20/2018   Lab Results  Component Value Date   TOTALPROTELP 6.4 08/20/2018   ALBUMINELP 3.8 08/20/2018   A1GS 0.3 08/20/2018   A2GS 0.7 08/20/2018   BETS 0.9 08/20/2018   BETA2SER 0.2 02/18/2018   GAMS 0.8 08/20/2018   MSPIKE 0.4 (H) 08/20/2018   SPEI Comment 07/09/2018     Chemistry      Component Value Date/Time   NA 140 10/21/2018 1013   K 4.0 10/21/2018 1013   CL 104 10/21/2018 1013   CO2 31 10/21/2018 1013   BUN 14 10/21/2018  1013   CREATININE 1.10 10/21/2018 1013   CREATININE 0.98 09/24/2018 1218      Component Value Date/Time   CALCIUM 9.7 10/21/2018 1013   ALKPHOS 85 (H) 10/21/2018 1013   AST 27 10/21/2018 1013   ALT 33 10/21/2018 1013   BILITOT 0.9 10/21/2018 1013      Impression and Plan: Mr. Fanelli is a very pleasant 57 yo caucasian gentleman with Waldenstrom's macroglobulinemia.   We will continue the [place].  Hopefully, the Waldenstrm's is lower.  If it is lower, then we will think about reducing his dose of Imbruvica by 1 pill.  We will plan to get him back in another 2 months.  If he does need to have any surgery for his left shoulder, I do not think this would be a problem from my point of view.   [name], MD 11/26/201910:53 AM

## 2018-10-22 ENCOUNTER — Telehealth: Payer: Self-pay | Admitting: *Deleted

## 2018-10-22 LAB — IGG, IGA, IGM
IgA: 42 mg/dL — ABNORMAL LOW (ref 90–386)
IgG (Immunoglobin G), Serum: 510 mg/dL — ABNORMAL LOW (ref 700–1600)
IgM (Immunoglobulin M), Srm: 432 mg/dL — ABNORMAL HIGH (ref 20–172)

## 2018-10-22 LAB — KAPPA/LAMBDA LIGHT CHAINS
KAPPA FREE LGHT CHN: 26.3 mg/L — AB (ref 3.3–19.4)
Kappa, lambda light chain ratio: 2.41 — ABNORMAL HIGH (ref 0.26–1.65)
Lambda free light chains: 10.9 mg/L (ref 5.7–26.3)

## 2018-10-22 NOTE — Telephone Encounter (Signed)
-----   Message from [name], MD sent at 10/22/2018  9:53 AM EST ----- Call - IgM level is now down to 430.  I would stay on the same dose of Imbruvica for now!!  Happy Thanksgiving!!  Laurey Arrow

## 2018-10-22 NOTE — Telephone Encounter (Signed)
As noted below by Dr. Marin Olp, I informed patient of his IgM level and to continue the same dose of Imbruvica. He verbalized understanding.

## 2018-10-27 LAB — IMMUNOFIXATION REFLEX, SERUM
IGA: 45 mg/dL — AB (ref 90–386)
IGM (IMMUNOGLOBULIN M), SRM: 496 mg/dL — AB (ref 20–172)
IgG (Immunoglobin G), Serum: 560 mg/dL — ABNORMAL LOW (ref 700–1600)

## 2018-10-27 LAB — PROTEIN ELECTROPHORESIS, SERUM, WITH REFLEX
A/G Ratio: 1.7 (ref 0.7–1.7)
ALPHA-1-GLOBULIN: 0.2 g/dL (ref 0.0–0.4)
ALPHA-2-GLOBULIN: 0.7 g/dL (ref 0.4–1.0)
Albumin ELP: 4 g/dL (ref 2.9–4.4)
BETA GLOBULIN: 0.8 g/dL (ref 0.7–1.3)
Gamma Globulin: 0.7 g/dL (ref 0.4–1.8)
Globulin, Total: 2.4 g/dL (ref 2.2–3.9)
M-SPIKE, %: 0.3 g/dL — AB
SPEP INTERP: 0
Total Protein ELP: 6.4 g/dL (ref 6.0–8.5)

## 2018-10-30 MED FILL — IMBRUVICA 420 MG TAB: 420 | 28 days supply | Qty: 28 | Fill #5

## 2018-11-07 NOTE — Progress Notes (Signed)
Assessment and Plan:  Nil was seen today for back pain and muscle pain.  Diagnoses and all orders for this visit:  Strain of lumbar region, initial encounter - negative straight leg Prednisone was prescribed,NSAIDs, RICE, and exercise given If not better follow up in office or will refer to PT/orthopedics. Natural history and expected course discussed. Questions answered. Proper lifting, bending technique discussed. Stretching exercises discussed. Heat to affected area as needed for local pain relief. NSAIDs per medication orders.  Suggested back support brace for 2-3 weeks, then as needed -     tiZANidine (ZANAFLEX) 4 MG tablet; Take 1 tablet (4 mg total) by mouth every 6 (six) hours as needed for muscle spasms. -     predniSONE (DELTASONE) 20 MG tablet; 3 tablets daily with food for 3 days, 2 tabs daily for 3 days, 1 tab a day for 5 days.  Further disposition pending results of labs. Discussed med's effects and SE's.   Over 15 minutes of exam, counseling, chart review, and critical decision making was performed.   Future Appointments  Date Time Provider [place]  12/22/2018  9:30 AM [facility] None  12/22/2018 10:00 AM Ennever, Rudell Cobb, MD [facility] None  01/16/2019 10:30 AM Liane Comber, NP [facility] None  04/23/2019  9:30 AM Unk Pinto, MD [facility] None    ------------------------------------------------------------------------------------------------------------------  HPI BP 130/84   Pulse (!) 51   Temp 97.9 F (36.6 C)   Ht 6' (1.829 m)   Wt 178 lb (80.7 kg)   SpO2 98%   BMI 24.14 kg/m   57 y.o.male with hx of seronegative RA and Waldenstrom's macroglobulinemia presents for evaluation of back pain; he reports he assisted a friend unloading a sofa from a truck and felt his lower back (bilateral, no midline pain) strain. He reports this happened 6 days ago. He describes pain as aching, bilateral paraspinal lumbar area. Denies radiation. He  reports pain resolves at rest, then is 8/10 with any movement. Denies numbness, tingling, radiating pain down extremities. He reports pain is much improved with taking hot showers.   He has been taking 800 mg ibuprofen TID PRN; this has been very helpful and resolves pain for the most part. Has been taking tizanidine 2 mg but hasn't noted much benefit.   Past Medical History:  Diagnosis Date  . Allergy   . Anemia   . Anxiety   . Arthritis   . Chest pain, non-cardiac   . COPD (chronic obstructive pulmonary disease) ([place])   . Counseling regarding goals of care 03/27/2018  . Depression   . Diverticulitis   . Fractured pelvis ([place])    fall from ladder  . GERD (gastroesophageal reflux disease)   . Gout   . History of shingles   . Hyperlipidemia   . Iron deficiency anemia 03/13/2018  . Kidney stones   . Other abnormal glucose 10/11/2015  . Rheumatoid arthritis ([place])    hands  . Waldenstrom's macroglobulinemia ([place]) 03/27/2018     Allergies  Allergen Reactions  . Humira [Adalimumab] Other (See Comments)    "blacked out", confused    Current Outpatient Medications on File Prior to Visit  Medication Sig  . ALPRAZolam (XANAX) 1 MG tablet Take 1/2 to 1 tablet 2 to 3 x /day ONLY if needed for Anxiety Attack(s)  . aspirin EC 81 MG tablet Take 81 mg daily by mouth.  Marland Kitchen atorvastatin (LIPITOR) 80 MG tablet Take 80 mg by mouth daily.  . bisoprolol (ZEBETA) 10 MG  tablet TAKE 1/2 TO 1 TABLET DAILY FOR BP (Patient taking differently: TAKE 1/2  tab daily 03/12/18)  . busPIRone (BUSPAR) 5 MG tablet TAKE 1/2 TO 1 TABLET 3 X /DAY WITH MEALS FOR ANXIETY  . Cholecalciferol (VITAMIN D-3) 5000 UNITS TABS Take 10,000 Units by mouth daily.   Marland Kitchen escitalopram (LEXAPRO) 20 MG tablet Take 20 mg by mouth daily.   . famciclovir (FAMVIR) 500 MG tablet Take 1 tablet (500 mg total) by mouth daily.  . famotidine (PEPCID) 40 MG tablet Take 1/2-1 tab with ibuprofen as needed.  . fluticasone (FLONASE) 50 MCG/ACT nasal  spray PLACE 2 SPRAYS INTO BOTH NOSTRILS DAILY. (Patient taking differently: PLACE 2 SPRAYS INTO BOTH NOSTRILS DAILY.  Takes prn.)  . folic acid (FOLVITE) 1 MG tablet Take 1 mg by mouth daily.  . Ibrutinib (IMBRUVICA) 420 MG TABS Take 420 mg by mouth daily.  Marland Kitchen ibuprofen (ADVIL,MOTRIN) 800 MG tablet Take 1 tablet (800 mg total) by mouth daily as needed.  Marland Kitchen levocetirizine (XYZAL) 5 MG tablet TAKE 1 TABLET (5 MG TOTAL) BY MOUTH EVERY EVENING.  Marland Kitchen losartan (COZAAR) 100 MG tablet Take 1 tablet at bedtime for BP  . meclizine (ANTIVERT) 25 MG tablet Take 1 tablet (25 mg total) by mouth 3 (three) times daily as needed for dizziness.  Marland Kitchen omeprazole-sodium bicarbonate (ZEGERID) 40-1100 MG capsule Take 1 capsule by mouth daily before breakfast.  . [DISCONTINUED] prochlorperazine (COMPAZINE) 10 MG tablet Take 1 tablet (10 mg total) by mouth every 6 (six) hours as needed (Nausea or vomiting).   No current facility-administered medications on file prior to visit.     ROS: all negative except above.   Physical Exam:  BP 130/84   Pulse (!) 51   Temp 97.9 F (36.6 C)   Ht 6' (1.829 m)   Wt 178 lb (80.7 kg)   SpO2 98%   BMI 24.14 kg/m   General Appearance: Well nourished, in no apparent distress. Eyes: conjunctiva no swelling or erythema ENT/Mouth: Hearing normal.  Neck: Supple  Respiratory: Respiratory effort normal, BS equal bilaterally without rales, rhonchi, wheezing or stridor.  Cardio: RRR with no MRGs. Brisk peripheral pulses without edema.  Abdomen: Soft, + BS.  Non tender, no masses. Lymphatics: Non tender without lymphadenopathy.  Musculoskeletal: 5/5 strength, normal gait. No midline tenderness, no SI joint tenderness; he is tender through bilteral lumbar paraspinal area. Neg straight leg raise. Pain with lumbar ROM in all spheres.  Skin: Warm, dry without rashes, lesions, ecchymosis.  Neuro: Cranial nerves intact. Normal muscle tone, no cerebellar symptoms. Sensation intact.  Psych:  Awake and oriented X 3, normal affect, Insight and Judgment appropriate.     [name], NP 9:05 AM [facility]

## 2018-11-10 ENCOUNTER — Encounter: Payer: Self-pay | Admitting: Adult Health

## 2018-11-10 ENCOUNTER — Ambulatory Visit: Payer: 59 | Admitting: Adult Health

## 2018-11-10 VITALS — BP 130/84 | HR 51 | Temp 97.9°F | Ht 72.0 in | Wt 178.0 lb

## 2018-11-10 DIAGNOSIS — S39012A Strain of muscle, fascia and tendon of lower back, initial encounter: Secondary | ICD-10-CM

## 2018-11-10 MED ORDER — TIZANIDINE HCL 4 MG PO TABS: 4.0000 mg | ORAL_TABLET | Freq: Four times a day (QID) | ORAL | 0 refills | Status: DC | PRN

## 2018-11-10 MED ORDER — PREDNISONE 20 MG PO TABS: ORAL_TABLET | ORAL | 0 refills | Status: AC

## 2018-11-10 NOTE — Patient Instructions (Addendum)
Low Back Strain A strain is a stretch or tear in a muscle or the strong cords of tissue that attach muscle to bone (tendons). Strains of the lower back (lumbar spine) are a common cause of low back pain. A strain occurs when muscles or tendons are torn or are stretched beyond their limits. The muscles may become inflamed, resulting in pain and sudden muscle tightening (spasms). A strain can happen suddenly due to an injury (trauma), or it can develop gradually due to overuse. There are three types of strains:  Grade 1 is a mild strain involving a minor tear of the muscle fibers or tendons. This may cause some pain but no loss of muscle strength.  Grade 2 is a moderate strain involving a partial tear of the muscle fibers or tendons. This causes more severe pain and some loss of muscle strength.  Grade 3 is a severe strain involving a complete tear of the muscle or tendon. This causes severe pain and complete or nearly complete loss of muscle strength. What are the causes? This condition may be caused by:  Trauma, such as a fall or a hit to the body.  Twisting or overstretching the back. This may result from doing activities that require a lot of energy, such as lifting heavy objects. What increases the risk? The following factors may increase your risk of getting this condition:  Playing contact sports.  Participating in sports or activities that put excessive stress on the back and require a lot of bending and twisting, including:  Lifting weights or heavy objects.  Gymnastics.  Soccer.  Figure skating.  Snowboarding.  Being overweight or obese.  Having poor strength and flexibility. What are the signs or symptoms? Symptoms of this condition may include:  Sharp or dull pain in the lower back that does not go away. Pain may extend to the buttocks.  Stiffness.  Limited range of motion.  Inability to stand up straight due to stiffness or pain.  Muscle spasms. How is this  diagnosed?   This condition may be diagnosed based on:  Your symptoms.  Your medical history.  A physical exam.  Your health care provider may push on certain areas of your back to determine the source of your pain.  You may be asked to bend forward, backward, and side to side to assess the severity of your pain and your range of motion.  Imaging tests, such as:  X-rays.  MRI. How is this treated? Treatment for this condition may include:  Applying heat and cold to the affected area.  Medicines to help relieve pain and to relax your muscles (muscle relaxants).  NSAIDs to help reduce swelling and discomfort.  Physical therapy. When your symptoms improve, it is important to gradually return to your normal routine as soon as possible to reduce pain, avoid stiffness, and avoid loss of muscle strength. Generally, symptoms should improve within 6 weeks of treatment. However, recovery time varies. Follow these instructions at home: Managing pain, stiffness, and swelling  If directed, apply ice to the injured area during the first 24 hours after your injury.  Put ice in a plastic bag.  Place a towel between your skin and the bag.  Leave the ice on for 20 minutes, 2-3 times a day.  If directed, apply heat to the affected area as often as told by your health care provider. Use the heat source that your health care provider recommends, such as a moist heat pack or a heating pad.    or a heating pad. ? Place a towel between your skin and the heat source. ? Leave the heat on for 20-30 minutes. ? Remove the heat if your skin turns bright red. This is especially important if you are unable to feel pain, heat, or cold. You may have a greater risk of getting burned. Activity  Rest and return to your normal activities as told by your health care provider. Ask your health care provider what activities are safe for you.  Avoid activities that take a lot of effort (are strenuous) for as long as told  by your health care provider.  Do exercises as told by your health care provider. General instructions   Take over-the-counter and prescription medicines only as told by your health care provider.  If you have questions or concerns about safety while taking pain medicine, talk with your health care provider.  Do not drive or operate heavy machinery until you know how your pain medicine affects you.  Do not use any tobacco products, such as cigarettes, chewing tobacco, and e-cigarettes. Tobacco can delay bone healing. If you need help quitting, ask your health care provider.  Keep all follow-up visits as told by your health care provider. This is important. How is this prevented?  Warm up and stretch before being active.  Cool down and stretch after being active.  Give your body time to rest between periods of activity.  Avoid: ? Being physically inactive for long periods at a time. ? Exercising or playing sports when you are tired or in pain.  Use correct form when playing sports and lifting heavy objects.  Use good posture when sitting and standing.  Maintain a healthy weight.  Sleep on a mattress with medium firmness to support your back.  Make sure to use equipment that fits you, including shoes that fit well.  Be safe and responsible while being active to avoid falls.  Do at least 150 minutes of moderate-intensity exercise each week, such as brisk walking or water aerobics. Try a form of exercise that takes stress off your back, such as swimming or stationary cycling.  Maintain physical fitness, including: ? Strength. ? Flexibility. ? Cardiovascular fitness. ? Endurance. Contact a health care provider if:  Your back pain does not improve after 6 weeks of treatment.  Your symptoms get worse. Get help right away if:  Your back pain is severe.  You are unable to stand or walk.  You develop pain in your legs.  You develop weakness in your buttocks or  legs.  You have difficulty controlling when you urinate or when you have a bowel movement. This information is not intended to replace advice given to you by your health care provider. Make sure you discuss any questions you have with your health care provider. Document Released: 11/12/2005 Document Revised: 07/19/2016 Document Reviewed: 08/24/2015 Elsevier Interactive Patient Education  2017 [place].     Mid-Back Strain Rehab Ask your health care provider which exercises are safe for you. Do exercises exactly as told by your health care provider and adjust them as directed. It is normal to feel mild stretching, pulling, tightness, or discomfort as you do these exercises, but you should stop right away if you feel sudden pain or your pain gets worse. Do not begin these exercises until told by your health care provider. Stretching and range of motion exercises This exercise warms up your muscles and joints and improves the movement and flexibility of your back and shoulders. This  exercise also help to relieve pain. Exercise A: Chest and spine stretch  1. Lie down on your back on a firm surface. 2. Roll a towel or a small blanket so it is about 4 inches (10 cm) in diameter. 3. Put the towel lengthwise under the middle of your back so it is under your spine, but not under your shoulder blades. 4. To increase the stretch, you may put your hands behind your head and let your elbows fall to your sides. 5. Hold for __________ seconds. Repeat exercise __________ times. Complete this exercise __________ times a day. Strengthening exercises These exercises build strength and endurance in your back and your shoulder blade muscles. Endurance is the ability to use your muscles for a long time, even after they get tired. Exercise B: Alternating arm and leg raises  1. Get on your hands and knees on a firm surface. If you are on a hard floor, you may want to use padding to cushion your knees, such as  an exercise mat. 2. Line up your arms and legs. Your hands should be below your shoulders, and your knees should be below your hips. 3. Lift your left leg behind you. At the same time, raise your right arm and straighten it in front of you. ? Do not lift your leg higher than your hip. ? Do not lift your arm higher than your shoulder. ? Keep your abdominal and back muscles tight. ? Keep your hips facing the ground. ? Do not arch your back. ? Keep your balance carefully, and do not hold your breath. 4. Hold for __________ seconds. 5. Slowly return to the starting position and repeat with your right leg and your left arm. Repeat __________ times. Complete this exercise __________ times a day. Exercise C: Straight arm rows ( shoulder extension) 1. Stand with your feet shoulder width apart. 2. Secure an exercise band to a stable object in front of you so the band is at or above shoulder height. 3. Hold one end of the exercise band in each hand. 4. Straighten your elbows and lift your hands up to shoulder height. 5. Step back, away from the secured end of the exercise band, until the band stretches. 6. Squeeze your shoulder blades together and pull your hands down to the sides of your thighs. Stop when your hands are straight down by your sides. Do not let your hands go behind your body. 7. Hold for __________ seconds. 8. Slowly return to the starting position. Repeat __________ times. Complete this exercise __________ times a day. Exercise D: Shoulder external rotation, prone 1. Lie on your abdomen on a firm bed so your left / right forearm hangs over the edge of the bed and your upper arm is on the bed, straight out from your body. ? Your elbow should be bent. ? Your palm should be facing your feet. 2. If instructed, hold a __________ weight in your hand. 3. Squeeze your shoulder blade toward the middle of your back. Do not let your shoulder lift toward your ear. 4. Keep your elbow bent in  an "L" shape (90 degrees) while you slowly move your forearm up toward the ceiling. Move your forearm up to the height of the bed, toward your head. ? Your upper arm should not move. ? At the top of the movement, your palm should face the floor. 5. Hold for __________ seconds. 6. Slowly return to the starting position and relax your muscles. Repeat __________ times. Complete this  exercise __________ times a day. Exercise E: Scapular retraction and external rotation, rowing  1. Sit in a stable chair without armrests, or stand. 2. Secure an exercise band to a stable object in front of you so it is at shoulder height. 3. Hold one end of the exercise band in each hand. 4. Bring your arms out straight in front of you. 5. Step back, away from the secured end of the exercise band, until the band stretches. 6. Pull the band backward. As you do this, bend your elbows and squeeze your shoulder blades together, but avoid letting the rest of your body move. Do not let your shoulders lift up toward your ears. 7. Stop when your elbows are at your sides or slightly behind your body. 8. Hold for __________ seconds. 9. Slowly straighten your arms to return to the starting position. Repeat __________ times. Complete this exercise __________ times a day. Posture and body mechanics  Body mechanics refers to the movements and positions of your body while you do your daily activities. Posture is part of body mechanics. Good posture and healthy body mechanics can help to relieve stress in your body's tissues and joints. Good posture means that your spine is in its natural S-curve position (your spine is neutral), your shoulders are pulled back slightly, and your head is not tipped forward. The following are general guidelines for applying improved posture and body mechanics to your everyday activities. Standing   When standing, keep your spine neutral and your feet about hip-width apart. Keep a slight bend in your  knees. Your ears, shoulders, and hips should line up.  When you do a task in which you lean forward while standing in one place for a long time, place one foot up on a stable object that is 2-4 inches (5-10 cm) high, such as a footstool. This helps keep your spine neutral. Sitting   When sitting, keep your spine neutral and keep your feet flat on the floor. Use a footrest, if necessary, and keep your thighs parallel to the floor. Avoid rounding your shoulders, and avoid tilting your head forward.  When working at a desk or a computer, keep your desk at a height where your hands are slightly lower than your elbows. Slide your chair under your desk so you are close enough to maintain good posture.  When working at a computer, place your monitor at a height where you are looking straight ahead and you do not have to tilt your head forward or downward to look at the screen. Resting  When lying down and resting, avoid positions that are most painful for you.  If you have pain with activities such as sitting, bending, stooping, or squatting (flexion-based activities), lie in a position in which your body does not bend very much. For example, avoid curling up on your side with your arms and knees near your chest (fetal position).  If you have pain with activities such as standing for a long time or reaching with your arms (extension-based activities), lie with your spine in a neutral position and bend your knees slightly. Try the following positions:  Lying on your side with a pillow between your knees.  Lying on your back with a pillow under your knees.  Lifting   When lifting objects, keep your feet at least shoulder-width apart and tighten your abdominal muscles.  Bend your knees and hips and keep your spine neutral. It is important to lift using the strength of your legs,  not your back. Do not lock your knees straight out.  Always ask for help to lift heavy or awkward objects. This  information is not intended to replace advice given to you by your health care provider. Make sure you discuss any questions you have with your health care provider. Document Released: 11/12/2005 Document Revised: 07/19/2016 Document Reviewed: 08/24/2015 Elsevier Interactive Patient Education  [employer].

## 2018-11-13 ENCOUNTER — Other Ambulatory Visit: Payer: Self-pay | Admitting: Hematology & Oncology

## 2018-11-17 ENCOUNTER — Telehealth: Payer: Self-pay | Admitting: *Deleted

## 2018-11-17 NOTE — Telephone Encounter (Signed)
Returned patient's phone call regarding Imbruvica. Patient stated,"I have been bleeding rectally for one week. When I go to wipe, the toilet paper is saturated with bright, red blood. I thought it was a hemorrhoid without pain. I've been taking Imbruvica for 3-5 months now with no problem. I wanted this office to know what was going on before the Holiday weekend." I told the patient that Dr. Marin Olp had already left for the day but would return tomorrow. Also, do not take the Imbruvica tonight at 5:30 pm (that's when he takes it daily). Instructed him to call 911 if the bleeding got worse over night, or if he started feeling lightheaded or dizzy. He verbalized understanding.

## 2018-11-18 ENCOUNTER — Other Ambulatory Visit: Payer: Self-pay | Admitting: *Deleted

## 2018-11-18 ENCOUNTER — Inpatient Hospital Stay: Payer: 59 | Attending: Hematology & Oncology

## 2018-11-18 ENCOUNTER — Encounter: Payer: Self-pay | Admitting: *Deleted

## 2018-11-18 DIAGNOSIS — D500 Iron deficiency anemia secondary to blood loss (chronic): Secondary | ICD-10-CM

## 2018-11-18 DIAGNOSIS — C880 Waldenstrom macroglobulinemia: Secondary | ICD-10-CM

## 2018-11-18 LAB — CBC WITH DIFFERENTIAL (CANCER CENTER ONLY)
Abs Immature Granulocytes: 0.39 10*3/uL — ABNORMAL HIGH (ref 0.00–0.07)
Basophils Absolute: 0 10*3/uL (ref 0.0–0.1)
Basophils Relative: 1 %
Eosinophils Absolute: 0.1 10*3/uL (ref 0.0–0.5)
Eosinophils Relative: 1 %
HEMATOCRIT: 42.7 % (ref 39.0–52.0)
Hemoglobin: 13.7 g/dL (ref 13.0–17.0)
Immature Granulocytes: 5 %
LYMPHS ABS: 0.8 10*3/uL (ref 0.7–4.0)
Lymphocytes Relative: 12 %
MCH: 31.9 pg (ref 26.0–34.0)
MCHC: 32.1 g/dL (ref 30.0–36.0)
MCV: 99.5 fL (ref 80.0–100.0)
MONOS PCT: 15 %
Monocytes Absolute: 1.1 10*3/uL — ABNORMAL HIGH (ref 0.1–1.0)
Neutro Abs: 4.8 10*3/uL (ref 1.7–7.7)
Neutrophils Relative %: 66 %
Platelet Count: 251 10*3/uL (ref 150–400)
RBC: 4.29 MIL/uL (ref 4.22–5.81)
RDW: 13.8 % (ref 11.5–15.5)
WBC Count: 7.2 10*3/uL (ref 4.0–10.5)
nRBC: 0 % (ref 0.0–0.2)

## 2018-11-18 LAB — CMP (CANCER CENTER ONLY)
ALT: 17 U/L (ref 0–44)
AST: 14 U/L — AB (ref 15–41)
Albumin: 4.2 g/dL (ref 3.5–5.0)
Alkaline Phosphatase: 104 U/L (ref 38–126)
Anion gap: 8 (ref 5–15)
BUN: 16 mg/dL (ref 6–20)
CO2: 30 mmol/L (ref 22–32)
Calcium: 9.2 mg/dL (ref 8.9–10.3)
Chloride: 103 mmol/L (ref 98–111)
Creatinine: 1.12 mg/dL (ref 0.61–1.24)
GFR, Est AFR Am: >60 mL/min (ref >60)
GFR, Estimated: >60 mL/min (ref >60)
Glucose, Bld: 97 mg/dL (ref 70–99)
Potassium: 4.4 mmol/L (ref 3.5–5.1)
Sodium: 141 mmol/L (ref 135–145)
Total Bilirubin: 0.7 mg/dL (ref 0.3–1.2)
Total Protein: 6 g/dL — ABNORMAL LOW (ref 6.5–8.1)

## 2018-11-18 NOTE — Telephone Encounter (Signed)
Patient here for labs. Hemoglobin is 13.7 today. MD aware. Patient stated,"I called Dr. Vena Rua office and I have an appointment on January 6th, 2020." I told him I would let Dr. Marin Olp know. Also, Dr. Marin Olp wants him to stop taking Imbruvica until he sees Dr. Hilarie Fredrickson. Patient verbalized understanding.

## 2018-11-18 NOTE — Telephone Encounter (Signed)
Per Dr. Marin Olp, this bleeding is not coming from the [facility]. Instructed patient we would make a referral to GI. Also, Dr. Marin Olp wants him to have labs drawn today at the office. Patient stated,"I already have a GI doctor. I see Dr. Hilarie Fredrickson at [facility]." I instructed patient to call Dr. Vena Rua office and make an appointment. Patient is coming in for labs at 9 am. He verbalized understanding of conversation. Message sent to scheduling for lab appointment.

## 2018-11-29 ENCOUNTER — Telehealth: Payer: Self-pay | Admitting: Hematology & Oncology

## 2018-11-29 NOTE — Telephone Encounter (Signed)
MEDICAL RECORDS REQUEST   REQUEST FAXED TO: [facility] [address] [place] [address]  PHONE # [phone] FAX # [phone]  REQUEST INDEXED

## 2018-12-01 ENCOUNTER — Ambulatory Visit: Payer: 59 | Admitting: Internal Medicine

## 2018-12-01 ENCOUNTER — Encounter: Payer: Self-pay | Admitting: Internal Medicine

## 2018-12-01 VITALS — BP 120/74 | HR 82 | Wt 174.2 lb

## 2018-12-01 DIAGNOSIS — K648 Other hemorrhoids: Secondary | ICD-10-CM

## 2018-12-01 NOTE — Patient Instructions (Signed)
Please follow up as needed 

## 2018-12-01 NOTE — Progress Notes (Signed)
Patient ID: [name], male   DOB: [birth year], 58 y.o.   MRN: [id number] HPI: Edwin Webster is a 58 year old male with a past medical history of colonic diverticulosis, history of small colonic adenoma 2013, internal hemorrhoids, GERD, Waldenstrm's macroglobulinemia, iron deficiency anemia, hyperlipidemia, rheumatoid arthritis and kidney stones who is seen in consultation at the request of Dr. Melford Aase and Dr. Marin Olp to evaluate painless rectal bleeding.  He is here today with his wife.  He is known to me from screening colonoscopy which I performed on 10/13/2015.  This was normal examination with an excellent prep.  There were internal hemorrhoids seen on retroflexion and mild sigmoid diverticulosis.  He reports that about a month ago he developed painless rectal bleeding.  This occurred after he had moved furniture with a friend.  He also injured his lower back but shortly after this developed painless red blood with wiping.  Did not see blood dripping into the toilet or in the toilet water.  Only occurred with bowel movement.  Again no rectal or anal pain.  No abdominal pain.  No change in bowel habit.  No diarrhea or constipation.  He started a stool softener though he was not constipated just because he thought it might help the blood he was seeing.  The only constipation he ever had was about 5 months ago when he was given Zofran while he was receiving chemotherapy for his Waldenstrm's macroglobulinemia.  He has been started about 4 months ago on a chemotherapeutic agent for his macroglobulinemia this is called ibrutinib.  He took this for a while but stopped a couple of weeks ago over concern that this may have been causing his rectal bleeding.  Past Medical History:  Diagnosis Date  . Allergy   . Anemia   . Anxiety   . Arthritis   . Chest pain, non-cardiac   . COPD (chronic obstructive pulmonary disease) ([place])   . Counseling regarding goals of care 03/27/2018  . Depression   .  Diverticulitis   . Diverticulosis   . Fractured pelvis ([place])    fall from ladder  . GERD (gastroesophageal reflux disease)   . Gout   . History of shingles   . Hyperlipidemia   . Internal hemorrhoids   . Iron deficiency anemia 03/13/2018  . Kidney stones   . Other abnormal glucose 10/11/2015  . Rheumatoid arthritis ([place])    hands  . Waldenstrom's macroglobulinemia ([place]) 03/27/2018    Past Surgical History:  Procedure Laterality Date  . COLONOSCOPY    . FINGER SURGERY    . iron infusion    . KNEE SURGERY     right  . ROTATOR CUFF REPAIR  1996   left    Outpatient Medications Prior to Visit  Medication Sig Dispense Refill  . ALPRAZolam (XANAX) 1 MG tablet Take 1/2 to 1 tablet 2 to 3 x /day ONLY if needed for Anxiety Attack(s) 90 tablet 1  . aspirin EC 81 MG tablet Take 81 mg daily by mouth.    Marland Kitchen atorvastatin (LIPITOR) 80 MG tablet Take 80 mg by mouth daily.    . bisoprolol (ZEBETA) 10 MG tablet TAKE 1/2 TO 1 TABLET DAILY FOR BP (Patient taking differently: TAKE 1/2  tab daily 03/12/18) 90 tablet 1  . busPIRone (BUSPAR) 5 MG tablet TAKE 1/2 TO 1 TABLET 3 X /DAY WITH MEALS FOR ANXIETY 270 tablet 1  . Cholecalciferol (VITAMIN D-3) 5000 UNITS TABS Take 10,000 Units by mouth daily.     Marland Kitchen  escitalopram (LEXAPRO) 20 MG tablet Take 20 mg by mouth daily.     . famciclovir (FAMVIR) 500 MG tablet Take 1 tablet (500 mg total) by mouth daily. 30 tablet 8  . famotidine (PEPCID) 40 MG tablet Take 1/2-1 tab with ibuprofen as needed. 90 tablet 1  . fluticasone (FLONASE) 50 MCG/ACT nasal spray PLACE 2 SPRAYS INTO BOTH NOSTRILS DAILY. (Patient taking differently: PLACE 2 SPRAYS INTO BOTH NOSTRILS DAILY.  Takes prn.) 16 g 2  . folic acid (FOLVITE) 1 MG tablet Take 1 mg by mouth daily.    . Ibrutinib (IMBRUVICA) 420 MG TABS Take 420 mg by mouth daily. (Patient taking differently: Take 420 mg by mouth daily. Currently on hold (12/01/18)) 30 tablet 6  . ibuprofen (ADVIL,MOTRIN) 800 MG tablet Take 1 tablet  (800 mg total) by mouth daily as needed. 90 tablet 1  . levocetirizine (XYZAL) 5 MG tablet TAKE 1 TABLET (5 MG TOTAL) BY MOUTH EVERY EVENING. 90 tablet 0  . losartan (COZAAR) 100 MG tablet Take 1 tablet at bedtime for BP 90 tablet 1  . meclizine (ANTIVERT) 25 MG tablet Take 1 tablet (25 mg total) by mouth 3 (three) times daily as needed for dizziness. 30 tablet 2  . omeprazole-sodium bicarbonate (ZEGERID) 40-1100 MG capsule TAKE 1 CAPSULE BY MOUTH EVERY DAY BEFORE BREAKFAST 90 capsule 1  . tiZANidine (ZANAFLEX) 4 MG tablet Take 1 tablet (4 mg total) by mouth every 6 (six) hours as needed for muscle spasms. 30 tablet 0   No facility-administered medications prior to visit.     Allergies  Allergen Reactions  . Humira [Adalimumab] Other (See Comments)    "blacked out", confused    Family History  Problem Relation Age of Onset  . Hyperlipidemia Mother   . COPD Mother   . Heart failure Mother   . Cancer Mother        breast  . Hyperlipidemia Brother   . Stroke Maternal Grandfather   . COPD Maternal Grandfather        colon  . Colon cancer Maternal Grandfather 36       died at 37  . Hypertension Father   . Esophageal cancer Neg Hx   . Stomach cancer Neg Hx   . Rectal cancer Neg Hx     Social History   Tobacco Use  . Smoking status: Former Smoker    Packs/day: 2.00    Types: Cigarettes    Last attempt to quit: 07/03/2010    Years since quitting: 8.4  . Smokeless tobacco: Never Used  Substance Use Topics  . Alcohol use: No    Alcohol/week: 0.0 standard drinks  . Drug use: No    ROS: As per history of present illness, otherwise negative  BP 120/74   Pulse 82   Wt 174 lb 4 oz (79 kg)   BMI 23.63 kg/m  Constitutional: Well-developed and well-nourished. No distress. HEENT: Normocephalic and atraumatic.  Conjunctivae are normal.  No scleral icterus. Neck: Neck supple. Trachea midline. Cardiovascular: Normal rate, regular rhythm and intact distal pulses Pulmonary/chest:  Effort normal and breath sounds normal. No wheezing, rales or rhonchi. Abdominal: Soft, nontender, nondistended. Bowel sounds active throughout.  Rectal: No external lesions or rash, normal tone, nontender, no masses, soft [place] heme-negative stool in the rectal vault Extremities: no clubbing, cyanosis, or edema Neurological: Alert and oriented to person place and time. Skin: Skin is warm and dry.  Psychiatric: Normal mood and affect. Behavior is normal.  RELEVANT LABS AND  IMAGING: CBC    Component Value Date/Time   WBC 7.2 11/18/2018 0919   WBC 4.7 03/25/2018 0718   RBC 4.29 11/18/2018 0919   HGB 13.7 11/18/2018 0919   HCT 42.7 11/18/2018 0919   PLT 251 11/18/2018 0919   MCV 99.5 11/18/2018 0919   MCH 31.9 11/18/2018 0919   MCHC 32.1 11/18/2018 0919   RDW 13.8 11/18/2018 0919   LYMPHSABS 0.8 11/18/2018 0919   MONOABS 1.1 (H) 11/18/2018 0919   EOSABS 0.1 11/18/2018 0919   BASOSABS 0.0 11/18/2018 0919    CMP     Component Value Date/Time   NA 141 11/18/2018 0919   K 4.4 11/18/2018 0919   CL 103 11/18/2018 0919   CO2 30 11/18/2018 0919   GLUCOSE 97 11/18/2018 0919   BUN 16 11/18/2018 0919   CREATININE 1.12 11/18/2018 0919   CREATININE 0.98 09/24/2018 1218   CALCIUM 9.2 11/18/2018 0919   PROT 6.0 (L) 11/18/2018 0919   ALBUMIN 4.2 11/18/2018 0919   AST 14 (L) 11/18/2018 0919   ALT 17 11/18/2018 0919   ALKPHOS 104 11/18/2018 0919   BILITOT 0.7 11/18/2018 0919   GFRNONAA >60 11/18/2018 0919   GFRNONAA 84 09/24/2018 1218   GFRAA >60 11/18/2018 0919   GFRAA 97 09/24/2018 1218    ASSESSMENT/PLAN: 58 year old male with a past medical history of colonic diverticulosis, history of small colonic adenoma 2013, internal hemorrhoids, GERD, Waldenstrm's macroglobulinemia, iron deficiency anemia, hyperlipidemia, rheumatoid arthritis and kidney stones who is seen in consultation at the request of Dr. Melford Aase and Dr. Marin Olp to evaluate painless rectal bleeding.  1.  Internal  hemorrhoids with bleeding --his symptoms are very consistent with internal hemorrhoidal bleeding.  I cannot completely exclude that the ibrutinib contributed to the bleeding as this can cause low platelets and bleeding, however I think it is more likely that his previously known internal hemorrhoids began bleeding.  We discussed the natural history of internal hemorrhoids and at now and recently no ongoing bleeding.  I feel that it is okay for him to resume ibrutinib and monitor for further bleeding.  Should this occur we could consider hemorrhoidal banding versus changing his therapy, however treating the hemorrhoids may be easier than changing therapy if this medication is working successfully for him. --He is asked to notify me if he has further rectal bleeding.  He voiced understanding  2.  History of remote colon polyp --surveillance colonoscopy recommended in November 2021    VQ:[id number], [place], [place] [address] [place], [place] [address]

## 2018-12-22 ENCOUNTER — Inpatient Hospital Stay: Payer: 59

## 2018-12-22 ENCOUNTER — Inpatient Hospital Stay: Payer: 59 | Attending: Hematology & Oncology | Admitting: Hematology & Oncology

## 2018-12-22 ENCOUNTER — Encounter: Payer: Self-pay | Admitting: Hematology & Oncology

## 2018-12-22 ENCOUNTER — Other Ambulatory Visit: Payer: Self-pay

## 2018-12-22 VITALS — BP 143/78 | HR 51 | Temp 98.0°F | Resp 18 | Wt 175.0 lb

## 2018-12-22 DIAGNOSIS — D509 Iron deficiency anemia, unspecified: Secondary | ICD-10-CM | POA: Diagnosis not present

## 2018-12-22 DIAGNOSIS — C880 Waldenstrom macroglobulinemia: Secondary | ICD-10-CM

## 2018-12-22 LAB — CBC WITH DIFFERENTIAL (CANCER CENTER ONLY)
Abs Immature Granulocytes: 0.06 10*3/uL (ref 0.00–0.07)
Basophils Absolute: 0 10*3/uL (ref 0.0–0.1)
Basophils Relative: 1 %
EOS ABS: 0.1 10*3/uL (ref 0.0–0.5)
Eosinophils Relative: 1 %
HCT: 41.1 % (ref 39.0–52.0)
Hemoglobin: 13.7 g/dL (ref 13.0–17.0)
IMMATURE GRANULOCYTES: 1 %
Lymphocytes Relative: 13 %
Lymphs Abs: 0.8 10*3/uL (ref 0.7–4.0)
MCH: 32.9 pg (ref 26.0–34.0)
MCHC: 33.3 g/dL (ref 30.0–36.0)
MCV: 98.6 fL (ref 80.0–100.0)
MONOS PCT: 14 %
Monocytes Absolute: 0.9 10*3/uL (ref 0.1–1.0)
Neutro Abs: 4.3 10*3/uL (ref 1.7–7.7)
Neutrophils Relative %: 70 %
Platelet Count: 260 10*3/uL (ref 150–400)
RBC: 4.17 MIL/uL — ABNORMAL LOW (ref 4.22–5.81)
RDW: 13.5 % (ref 11.5–15.5)
WBC Count: 6.2 10*3/uL (ref 4.0–10.5)
nRBC: 0 % (ref 0.0–0.2)

## 2018-12-22 LAB — CMP (CANCER CENTER ONLY)
ALT: 16 U/L (ref 0–44)
AST: 18 U/L (ref 15–41)
Albumin: 4.8 g/dL (ref 3.5–5.0)
Alkaline Phosphatase: 89 U/L (ref 38–126)
Anion gap: 8 (ref 5–15)
BUN: 17 mg/dL (ref 6–20)
CO2: 30 mmol/L (ref 22–32)
Calcium: 10.2 mg/dL (ref 8.9–10.3)
Chloride: 105 mmol/L (ref 98–111)
Creatinine: 1.28 mg/dL — ABNORMAL HIGH (ref 0.61–1.24)
GFR, Est AFR Am: >60 mL/min (ref >60)
GFR, Estimated: >60 mL/min (ref >60)
Glucose, Bld: 90 mg/dL (ref 70–99)
Potassium: 4.2 mmol/L (ref 3.5–5.1)
Sodium: 143 mmol/L (ref 135–145)
Total Bilirubin: 0.6 mg/dL (ref 0.3–1.2)
Total Protein: 6.9 g/dL (ref 6.5–8.1)

## 2018-12-22 NOTE — Progress Notes (Signed)
Hematology and Oncology Follow Up Visit  Edwin Webster [id number] February 22, 1961 58 y.o. 12/22/2018   Principle Diagnosis:  Waldenstrom's macroglobulinemia Iron deficiency anemia  Current Therapy:   Rituxan/bendamustine-s/p cycle 2 -- d/c on 06/11/2018 Imbruvica 420 mg po q day -- started on 06/14/2018 IV iron as indicated - last received in April 2019   Interim History:  [name] is here today for follow-up.  He got through all of the holidays.  He did pretty well.  More last saw him, he had fallen and hurt his left shoulder.  He is doing okay with this.  He apparently has some rectal bleeding.  I do not think this is from the [place].  He was found to have an internal hemorrhoid.  I told him that he can have a another colonoscopy at any time in order for the hemorrhoid to be removed.  His water streams studies were improved.  His last M spike was 0.3 g/dL in November.  His IgM level was 432 mg/dL.  He is retired.  He is enjoying retirement.  He has had no chest pain.  He has had no fluttering with the Imbruvica.  He has had no nausea or vomiting.  He has had no rashes.  Overall, his performance status is ECOG 1.    Medications:  Allergies as of 12/22/2018      Reactions   Adalimumab Other (See Comments)   "blacked out", confused "blacked out"      Medication List       Accurate as of December 22, 2018 11:21 AM. Always use your most recent med list.        ALPRAZolam 1 MG tablet Commonly known as:  XANAX Take 1/2 to 1 tablet 2 to 3 x /day ONLY if needed for Anxiety Attack(s)   aspirin EC 81 MG tablet Take 81 mg daily by mouth.   atorvastatin 80 MG tablet Commonly known as:  LIPITOR Take 80 mg by mouth daily.   bisoprolol 10 MG tablet Commonly known as:  ZEBETA TAKE 1/2 TO 1 TABLET DAILY FOR BP   busPIRone 5 MG tablet Commonly known as:  BUSPAR TAKE 1/2 TO 1 TABLET 3 X /DAY WITH MEALS FOR ANXIETY   escitalopram 20 MG tablet Commonly known as:  LEXAPRO Take  20 mg by mouth daily.   famciclovir 500 MG tablet Commonly known as:  FAMVIR Take 1 tablet (500 mg total) by mouth daily.   famotidine 40 MG tablet Commonly known as:  PEPCID Take 1/2-1 tab with ibuprofen as needed.   fluticasone 50 MCG/ACT nasal spray Commonly known as:  FLONASE PLACE 2 SPRAYS INTO BOTH NOSTRILS DAILY.   folic acid 1 MG tablet Commonly known as:  FOLVITE Take 1 mg by mouth daily.   Ibrutinib 420 MG Tabs Commonly known as:  IMBRUVICA Take 420 mg by mouth daily.   ibuprofen 800 MG tablet Commonly known as:  MOTRIN IB Take 1 tablet (800 mg total) by mouth daily as needed.   levocetirizine 5 MG tablet Commonly known as:  XYZAL TAKE 1 TABLET (5 MG TOTAL) BY MOUTH EVERY EVENING.   losartan 100 MG tablet Commonly known as:  COZAAR Take 1 tablet at bedtime for BP   meclizine 25 MG tablet Commonly known as:  ANTIVERT Take 1 tablet (25 mg total) by mouth 3 (three) times daily as needed for dizziness.   omeprazole-sodium bicarbonate 40-1100 MG capsule Commonly known as:  ZEGERID TAKE 1 CAPSULE BY MOUTH EVERY DAY BEFORE  BREAKFAST   tiZANidine 4 MG tablet Commonly known as:  ZANAFLEX Take 1 tablet (4 mg total) by mouth every 6 (six) hours as needed for muscle spasms.   Vitamin D-3 125 MCG (5000 UT) Tabs Take 10,000 Units by mouth daily.       Allergies:  Allergies  Allergen Reactions  . Adalimumab Other (See Comments)    "blacked out", confused "blacked out"    Past Medical History, Surgical history, Social history, and Family History were reviewed and updated.  Review of Systems: Review of Systems  Constitutional: Negative.   HENT: Negative.   Eyes: Negative.   Respiratory: Negative.   Cardiovascular: Negative.   Gastrointestinal: Negative.   Genitourinary: Negative.   Musculoskeletal: Negative.   Skin: Negative.   Neurological: Negative.   Endo/Heme/Allergies: Negative.   Psychiatric/Behavioral: Negative.       Physical Exam:   weight is 175 lb (79.4 kg). His oral temperature is 98 F (36.7 C). His blood pressure is 143/78 (abnormal) and his pulse is 51 (abnormal). His respiration is 18 and oxygen saturation is 100%.   Wt Readings from Last 3 Encounters:  12/22/18 175 lb (79.4 kg)  12/01/18 174 lb 4 oz (79 kg)  11/10/18 178 lb (80.7 kg)    Physical Exam Vitals signs reviewed.  HENT:     Head: Normocephalic and atraumatic.  Eyes:     Pupils: Pupils are equal, round, and reactive to light.  Neck:     Musculoskeletal: Normal range of motion.  Cardiovascular:     Rate and Rhythm: Normal rate and regular rhythm.     Heart sounds: Normal heart sounds.  Pulmonary:     Effort: Pulmonary effort is normal.     Breath sounds: Normal breath sounds.  Abdominal:     General: Bowel sounds are normal.     Palpations: Abdomen is soft.  Musculoskeletal: Normal range of motion.        General: No tenderness or deformity.  Lymphadenopathy:     Cervical: No cervical adenopathy.  Skin:    General: Skin is warm and dry.     Findings: No erythema or rash.  Neurological:     Mental Status: He is alert and oriented to person, place, and time.  Psychiatric:        Behavior: Behavior normal.        Thought Content: Thought content normal.        Judgment: Judgment normal.      Lab Results  Component Value Date   WBC 6.2 12/22/2018   HGB 13.7 12/22/2018   HCT 41.1 12/22/2018   MCV 98.6 12/22/2018   PLT 260 12/22/2018   Lab Results  Component Value Date   FERRITIN 556 (H) 07/09/2018   IRON 109 07/09/2018   TIBC 274 07/09/2018   UIBC 164 07/09/2018   IRONPCTSAT 40 (L) 07/09/2018   Lab Results  Component Value Date   RETICCTPCT 0.9 02/12/2018   RBC 4.17 (L) 12/22/2018   RETICCTABS 33,120 02/12/2018   Lab Results  Component Value Date   KPAFRELGTCHN 26.3 (H) 10/21/2018   LAMBDASER 10.9 10/21/2018   KAPLAMBRATIO 2.41 (H) 10/21/2018   Lab Results  Component Value Date   IGGSERUM 510 (L) 10/21/2018    IGGSERUM 560 (L) 10/21/2018   IGA 42 (L) 10/21/2018   IGA 45 (L) 10/21/2018   IGMSERUM 432 (H) 10/21/2018   IGMSERUM 496 (H) 10/21/2018   Lab Results  Component Value Date   TOTALPROTELP 6.4 10/21/2018  ALBUMINELP 4.0 10/21/2018   A1GS 0.2 10/21/2018   A2GS 0.7 10/21/2018   BETS 0.8 10/21/2018   BETA2SER 0.2 02/18/2018   GAMS 0.7 10/21/2018   MSPIKE 0.3 (H) 10/21/2018   SPEI Comment 07/09/2018     Chemistry      Component Value Date/Time   NA 143 12/22/2018 0924   K 4.2 12/22/2018 0924   CL 105 12/22/2018 0924   CO2 30 12/22/2018 0924   BUN 17 12/22/2018 0924   CREATININE 1.28 (H) 12/22/2018 0924   CREATININE 0.98 09/24/2018 1218      Component Value Date/Time   CALCIUM 10.2 12/22/2018 0924   ALKPHOS 89 12/22/2018 0924   AST 18 12/22/2018 0924   ALT 16 12/22/2018 0924   BILITOT 0.6 12/22/2018 0924      Impression and Plan: Mr. Miera is a very pleasant 58 yo caucasian gentleman with Waldenstrom's macroglobulinemia.   We will continue the [place].  Hopefully, the Waldenstrm's is lower.  If it is lower, then we will think about reducing his dose of Imbruvica by 1 pill.  We will plan to get him back in another 3 months.    [name], MD 1/27/202011:21 AM

## 2018-12-23 LAB — IGG, IGA, IGM
IgA: 36 mg/dL — ABNORMAL LOW (ref 90–386)
IgG (Immunoglobin G), Serum: 474 mg/dL — ABNORMAL LOW (ref 700–1600)
IgM (Immunoglobulin M), Srm: 596 mg/dL — ABNORMAL HIGH (ref 20–172)

## 2018-12-23 LAB — KAPPA/LAMBDA LIGHT CHAINS
Kappa free light chain: 27.1 mg/L — ABNORMAL HIGH (ref 3.3–19.4)
Kappa, lambda light chain ratio: 2.4 — ABNORMAL HIGH (ref 0.26–1.65)
Lambda free light chains: 11.3 mg/L (ref 5.7–26.3)

## 2018-12-23 MED FILL — IMBRUVICA 420 MG TAB: 420 | 28 days supply | Qty: 28 | Fill #6

## 2018-12-24 LAB — IMMUNOFIXATION REFLEX, SERUM
IGA: 38 mg/dL — AB (ref 90–386)
IGG (IMMUNOGLOBIN G), SERUM: 504 mg/dL — AB (ref 700–1600)
IgM (Immunoglobulin M), Srm: 630 mg/dL — ABNORMAL HIGH (ref 20–172)

## 2018-12-24 LAB — PROTEIN ELECTROPHORESIS, SERUM, WITH REFLEX
A/G Ratio: 1.6 (ref 0.7–1.7)
Albumin ELP: 4.1 g/dL (ref 2.9–4.4)
Alpha-1-Globulin: 0.3 g/dL (ref 0.0–0.4)
Alpha-2-Globulin: 0.8 g/dL (ref 0.4–1.0)
BETA GLOBULIN: 0.7 g/dL (ref 0.7–1.3)
GLOBULIN, TOTAL: 2.6 g/dL (ref 2.2–3.9)
Gamma Globulin: 0.8 g/dL (ref 0.4–1.8)
M-Spike, %: 0.4 g/dL — ABNORMAL HIGH
SPEP INTERP: 0
Total Protein ELP: 6.7 g/dL (ref 6.0–8.5)

## 2018-12-25 ENCOUNTER — Other Ambulatory Visit: Payer: Self-pay | Admitting: Physician Assistant

## 2018-12-25 DIAGNOSIS — R0989 Other specified symptoms and signs involving the circulatory and respiratory systems: Secondary | ICD-10-CM

## 2018-12-29 ENCOUNTER — Ambulatory Visit: Payer: 59 | Admitting: Adult Health

## 2018-12-29 ENCOUNTER — Encounter: Payer: Self-pay | Admitting: Adult Health

## 2018-12-29 VITALS — BP 134/94 | HR 71 | Temp 97.5°F | Ht 72.0 in | Wt 174.0 lb

## 2018-12-29 DIAGNOSIS — J0141 Acute recurrent pansinusitis: Secondary | ICD-10-CM | POA: Diagnosis not present

## 2018-12-29 MED ORDER — FLUTICASONE PROPIONATE 50 MCG/ACT NA SUSP: NASAL | 2 refills | Status: DC

## 2018-12-29 MED ORDER — PREDNISONE 20 MG PO TABS: ORAL_TABLET | ORAL | 0 refills | Status: DC

## 2018-12-29 MED ORDER — AZITHROMYCIN 250 MG PO TABS: ORAL_TABLET | ORAL | 1 refills | Status: AC

## 2018-12-29 NOTE — Progress Notes (Signed)
Assessment and Plan:  Edwin Webster was seen today for acute visit.  Diagnoses and all orders for this visit:  Acute recurrent pansinusitis Will initiate abx due to co morbidities;  Suggested symptomatic OTC remedies- mucinex Nasal saline spray for congestion. Nasal steroids, rotate allergy pill, oral steroids offered Continue tylenol/NSAID if needed Follow up as needed if not improving/resolving.  -     fluticasone (FLONASE) 50 MCG/ACT nasal spray; PLACE 2 SPRAYS INTO BOTH NOSTRILS DAILY.  Takes prn. -     predniSONE (DELTASONE) 20 MG tablet; 2 tablets daily for 3 days, 1 tablet daily for 4 days. -     azithromycin (ZITHROMAX) 250 MG tablet; Take 2 tablets (500 mg) on  Day 1,  followed by 1 tablet (250 mg) once daily on Days 2 through 5.  Further disposition pending results of labs. Discussed med's effects and SE's.   Over 15 minutes of exam, counseling, chart review, and critical decision making was performed.   Future Appointments  Date Time Provider [place]  12/29/2018 11:00 AM Liane Comber, NP [facility] None  01/20/2019 11:15 AM Liane Comber, NP [facility] None  03/24/2019  9:00 AM [facility] None  03/24/2019  9:30 AM Ennever, Rudell Cobb, MD [facility] None  04/23/2019  9:30 AM Unk Pinto, MD [facility] None    ------------------------------------------------------------------------------------------------------------------   HPI BP (!) 134/94   Pulse 71   Temp (!) 97.5 F (36.4 C)   Ht 6' (1.829 m)   Wt 174 lb (78.9 kg)   SpO2 98%   BMI 23.60 kg/m   58 y.o.male with hx of Waldenstrome macroglobulinemia and RA presents for evaluation of "sinus" symptoms that began 3 days ago; he reports began with postnasal drip, scratchy throat, running nose, facial pressure. He has had mild intermittent headache for the past 2 weeks. He reports sensation of fever/chills, has had night sweats for the last 2 days.   He has a hx of recurrent sinus infections, 1-2 per year.  He has hx of seasonal allergies, and has been on xyzal for several years, he started flonase but ran out. No known sick contacts. He has been taking ibuprofen/tylenol for headache, took 400 mg ibuprofen this AM which does resolve HA.   He did have flu vaccine this season. Denies known sick contacts.   Past Medical History:  Diagnosis Date  . Allergy   . Anemia   . Anxiety   . Arthritis   . Chest pain, non-cardiac   . COPD (chronic obstructive pulmonary disease) ([place])   . Counseling regarding goals of care 03/27/2018  . Depression   . Diverticulitis   . Diverticulosis   . Fractured pelvis ([place])    fall from ladder  . GERD (gastroesophageal reflux disease)   . Gout   . History of shingles   . Hyperlipidemia   . Internal hemorrhoids   . Iron deficiency anemia 03/13/2018  . Kidney stones   . Other abnormal glucose 10/11/2015  . Rheumatoid arthritis ([place])    hands  . Waldenstrom's macroglobulinemia ([place]) 03/27/2018     Allergies  Allergen Reactions  . Adalimumab Other (See Comments)    "blacked out", confused "blacked out"    Current Outpatient Medications on File Prior to Visit  Medication Sig  . ALPRAZolam (XANAX) 1 MG tablet Take 1/2 to 1 tablet 2 to 3 x /day ONLY if needed for Anxiety Attack(s)  . aspirin EC 81 MG tablet Take 81 mg daily by mouth.  Marland Kitchen atorvastatin (LIPITOR) 80 MG  tablet Take 80 mg by mouth daily.  . bisoprolol (ZEBETA) 10 MG tablet TAKE 1/2 TO 1 TABLET DAILY FOR BP  . busPIRone (BUSPAR) 5 MG tablet TAKE 1/2 TO 1 TABLET 3 X /DAY WITH MEALS FOR ANXIETY  . Cholecalciferol (VITAMIN D-3) 5000 UNITS TABS Take 10,000 Units by mouth daily.   Marland Kitchen escitalopram (LEXAPRO) 20 MG tablet Take 20 mg by mouth daily.   . famciclovir (FAMVIR) 500 MG tablet Take 1 tablet (500 mg total) by mouth daily.  . famotidine (PEPCID) 40 MG tablet Take 1/2-1 tab with ibuprofen as needed.  . fluticasone (FLONASE) 50 MCG/ACT nasal spray PLACE 2 SPRAYS INTO BOTH NOSTRILS DAILY. (Patient taking  differently: PLACE 2 SPRAYS INTO BOTH NOSTRILS DAILY.  Takes prn.)  . folic acid (FOLVITE) 1 MG tablet Take 1 mg by mouth daily.  . Ibrutinib (IMBRUVICA) 420 MG TABS Take 420 mg by mouth daily. (Patient taking differently: Take 420 mg by mouth daily. Currently on hold (12/01/18))  . ibuprofen (ADVIL,MOTRIN) 800 MG tablet Take 1 tablet (800 mg total) by mouth daily as needed.  Marland Kitchen levocetirizine (XYZAL) 5 MG tablet TAKE 1 TABLET (5 MG TOTAL) BY MOUTH EVERY EVENING.  Marland Kitchen losartan (COZAAR) 100 MG tablet Take 1 tablet at bedtime for BP  . meclizine (ANTIVERT) 25 MG tablet Take 1 tablet (25 mg total) by mouth 3 (three) times daily as needed for dizziness.  Marland Kitchen omeprazole-sodium bicarbonate (ZEGERID) 40-1100 MG capsule TAKE 1 CAPSULE BY MOUTH EVERY DAY BEFORE BREAKFAST  . tiZANidine (ZANAFLEX) 4 MG tablet Take 1 tablet (4 mg total) by mouth every 6 (six) hours as needed for muscle spasms.  . [DISCONTINUED] prochlorperazine (COMPAZINE) 10 MG tablet Take 1 tablet (10 mg total) by mouth every 6 (six) hours as needed (Nausea or vomiting).   No current facility-administered medications on file prior to visit.     ROS: all negative except above.   Physical Exam:  Wt 174 lb (78.9 kg)   BMI 23.60 kg/m   General Appearance: Well nourished, in no apparent distress. Eyes: PERRLA, EOMs, conjunctiva no swelling or erythema Sinuses: Generalized Frontal/maxillary tenderness ENT/Mouth: Ext aud canals clear, TMs without erythema, bulging. No erythema, swelling, or exudate on post pharynx.  Tonsils not swollen or erythematous. Hearing normal.  Neck: Supple Respiratory: Respiratory effort normal, BS equal bilaterally without rales, rhonchi, wheezing or stridor.  Cardio: RRR with no MRGs. Brisk peripheral pulses without edema. Marland Kitchen Lymphatics: Non tender without lymphadenopathy.  Musculoskeletal: normal gait.  Skin: Warm, dry without rashes, lesions, ecchymosis.  Neuro: Cranial nerves intact. Normal muscle tone Psych:  Awake and oriented X 3, normal affect, Insight and Judgment appropriate.     [name], NP 10:55 AM [facility]

## 2018-12-29 NOTE — Patient Instructions (Addendum)
Rotate allergy medication - try allegra (fexofenadine)   Saline irrigation, flonase  Mucinex may still be helpful - this dries up secretions    HOW TO TREAT VIRAL COUGH AND COLD SYMPTOMS:  -Symptoms usually last at least 1 week with the worst symptoms being around day 4.  - colds usually start with a sore throat and end with a cough, and the cough can take 2 weeks to get better.  -No antibiotics are needed for colds, flu, sore throats, cough, bronchitis UNLESS symptoms are longer than 7 days OR if you are getting better then get drastically worse.  -There are a lot of combination medications (Dayquil, Nyquil, Vicks 44, tyelnol cold and sinus, ETC). Please look at the ingredients on the back so that you are treating the correct symptoms and not doubling up on medications/ingredients.    Medicines you can use  Nasal congestion  Little Remedies saline spray (aerosol/mist)- can try this, it is in the kids section - pseudoephedrine (Sudafed)- behind the counter, do not use if you have high blood pressure, medicine that have -D in them.  - phenylephrine (Sudafed PE) -Dextormethorphan + chlorpheniramine (Coridcidin HBP)- okay if you have high blood pressure -Oxymetazoline (Afrin) nasal spray- LIMIT to 3 days -Saline nasal spray -Neti pot (used distilled or bottled water)  Ear pain/congestion  -pseudoephedrine (sudafed) - Nasonex/flonase nasal spray  Fever  -Acetaminophen (Tyelnol) -Ibuprofen (Advil, motrin, aleve)  Sore Throat  -Acetaminophen (Tyelnol) -Ibuprofen (Advil, motrin, aleve) -Drink a lot of water -Gargle with salt water - Rest your voice (don't talk) -Throat sprays -Cough drops  Body Aches  -Acetaminophen (Tyelnol) -Ibuprofen (Advil, motrin, aleve)  Headache  -Acetaminophen (Tyelnol) -Ibuprofen (Advil, motrin, aleve) - Exedrin, Exedrin Migraine  Allergy symptoms (cough, sneeze, runny nose, itchy eyes) -Claritin or loratadine cheapest but likely the weakest   -Zyrtec or certizine at night because it can make you sleepy -The strongest is allegra or fexafinadine  Cheapest at [employer], [employer], [employer]  Cough  -Dextromethorphan (Delsym)- medicine that has DM in it -Guafenesin (Mucinex/Robitussin) - cough drops - drink lots of water  Chest Congestion  -Guafenesin (Mucinex/Robitussin)  Red Itchy Eyes  - Naphcon-A  Upset Stomach  - Bland diet (nothing spicy, greasy, fried, and high acid foods like tomatoes, oranges, berries) -OKAY- cereal, bread, soup, crackers, rice -Eat smaller more frequent meals -reduce caffeine, no alcohol -Loperamide (Imodium-AD) if diarrhea -Prevacid for heart burn  General health when sick  -Hydration -wash your hands frequently -keep surfaces clean -change pillow cases and sheets often -Get fresh air but do not exercise strenuously -Vitamin D, double up on it - Vitamin C -Zinc

## 2019-01-14 NOTE — Progress Notes (Signed)
FOLLOW UP  Assessment and Plan:   Hypertension Well controlled with current medications; monitor pulse closely at home; cut bisoprolol in 1/2 if consistently in 40s Monitor blood pressure at home; patient to call if consistently greater than 130/80 Continue DASH diet.   Reminder to go to the ER if any CP, SOB, nausea, dizziness, severe HA, changes vision/speech, left arm numbness and tingling and jaw pain.  Cholesterol Currently above goal; currently on atorvastatin 40 mg daily - discussed transition to rosuvastatin 40 mg daily if remains similarly above goal today  Continue low cholesterol diet and exercise.  Check lipid panel.   Other abnormal glucose Recent A1Cs at goal Discussed diet/exercise, weight management  Defer A1C; check CMP  BMI 24 Recommended diet heavy in fruits and veggies and low in animal meats, cheeses, and dairy products, appropriate calorie intake Discussed exercise guidelines/recommendations Discussed ideal weight for height  Will follow up in 3 months  Vitamin D Def Last check mildly low; patient supplements with 10000 IU dialy  Defer vitamin D   Waldenstrom's/anemia Managed by Dr. Marin Olp Fatigue improved with currently treatment; ECOG 1 Follow CBC at this office  Depression/anxiety Continue medications; increase buspar to 7.5 mg TID; discussed appropriate use of xanax; take 1/4-1/2 tab PRN Lifestyle discussed: diet/exerise, sleep hygiene, stress management, hydration   Continue diet and meds as discussed. Further disposition pending results of labs. Discussed med's effects and SE's.   Over 30 minutes of exam, counseling, chart review, and critical decision making was performed.   Future Appointments  Date Time Provider [place]  01/20/2019 11:15 AM Liane Comber, NP [facility] None  03/24/2019  9:00 AM [facility] None  03/24/2019  9:30 AM Ennever, Rudell Cobb, MD [facility] None  04/23/2019  9:30 AM Unk Pinto, MD [facility]  None    ----------------------------------------------------------------------------------------------------------------------  HPI BP 112/80   Pulse (!) 46   Temp (!) 97.5 F (36.4 C)   Wt 180 lb (81.6 kg)   SpO2 99%   BMI 24.41 kg/m   58 y.o. male  Presents accompanied by his wife for 3 month follow up on hypertension, cholesterol, glucose management, weight and vitamin D deficiency. 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 Marin Olp, Rituxan/bendamustine-s/pcycle 2, Imbruvica 420 mg po q day and reports significant improvement with this, ECOG of 1. He is retired. He has iron deficiency anemia that is also monitored, IV iron as indicated - last received in April 2019.  he has a diagnosis of depression/anxiety and is currently on lexapro 20 mg daily, buspar 5 mg TID, xanax 0.5-1 mg PRN, reports symptoms are not well controlled on current regimen. he has taken xanax only once, didn't like how he felt (took full 1 mg).   BMI is Body mass index is 24.41 kg/m., he has not been working on diet and exercise. Walks long hours at work.  Wt Readings from Last 3 Encounters:  01/20/19 180 lb (81.6 kg)  12/29/18 174 lb (78.9 kg)  12/22/18 175 lb (79.4 kg)   His blood pressure has been controlled at home, today their BP is BP: 112/80  He does not workout. He denies chest pain, shortness of breath, dizziness.   He is on cholesterol medication (atorvastatin 40 mg daily) and denies myalgias. His cholesterol is not at goal. The cholesterol last visit was:  Lab Results  Component Value Date   CHOL 190 09/24/2018   HDL 58 09/24/2018   LDLCALC 116 (H) 09/24/2018   TRIG 65 09/24/2018   CHOLHDL 3.3 09/24/2018    He has not been working on diet and exercise for glucose management (intermittent A1Cs in  prediabetic range), and denies foot ulcerations, increased appetite, nausea, paresthesia of the feet, polydipsia, polyuria, visual disturbances, vomiting and weight loss. Last A1C in the office was:  Lab Results  Component Value Date   HGBA1C 5.3 09/24/2018   Patient is on Vitamin D supplement but below goal of 70 at last check:    Lab Results  Component Value Date   VD25OH 48 09/24/2018       Current Medications:  Current Outpatient Medications on File Prior to Visit  Medication Sig  . ALPRAZolam (XANAX) 1 MG tablet Take 1/2 to 1 tablet 2 to 3 x /day ONLY if needed for Anxiety Attack(s)  . aspirin EC 81 MG tablet Take 81 mg daily by mouth.  Marland Kitchen atorvastatin (LIPITOR) 80 MG tablet Take 80 mg by mouth daily.  . bisoprolol (ZEBETA) 10 MG tablet TAKE 1/2 TO 1 TABLET DAILY FOR BP  . Cholecalciferol (VITAMIN D-3) 5000 UNITS TABS Take 10,000 Units by mouth daily.   Marland Kitchen escitalopram (LEXAPRO) 20 MG tablet Take 20 mg by mouth daily.   . famciclovir (FAMVIR) 500 MG tablet Take 1 tablet (500 mg total) by mouth daily.  . famotidine (PEPCID) 40 MG tablet Take 1/2-1 tab with ibuprofen as needed.  . fluticasone (FLONASE) 50 MCG/ACT nasal spray PLACE 2 SPRAYS INTO BOTH NOSTRILS DAILY.  Takes prn.  . folic acid (FOLVITE) 1 MG tablet Take 1 mg by mouth daily.  . Ibrutinib (IMBRUVICA) 420 MG TABS Take 420 mg by mouth daily.  Marland Kitchen ibuprofen (ADVIL,MOTRIN) 800 MG tablet Take 1 tablet (800 mg total) by mouth daily as needed.  Marland Kitchen levocetirizine (XYZAL) 5 MG tablet TAKE 1 TABLET (5 MG TOTAL) BY MOUTH EVERY EVENING.  . meclizine (ANTIVERT) 25 MG tablet Take 1 tablet (25 mg total) by mouth 3 (three) times daily as needed for dizziness.  Marland Kitchen omeprazole-sodium bicarbonate (ZEGERID) 40-1100 MG capsule TAKE 1 CAPSULE BY MOUTH EVERY DAY BEFORE BREAKFAST  . tiZANidine (ZANAFLEX) 4 MG tablet Take 1 tablet (4 mg total) by mouth every 6 (six) hours as needed for muscle spasms.  Marland Kitchen losartan (COZAAR) 100 MG tablet Take 1 tablet at  bedtime for BP (Patient not taking: Reported on 12/29/2018)  . [DISCONTINUED] prochlorperazine (COMPAZINE) 10 MG tablet Take 1 tablet (10 mg total) by mouth every 6 (six) hours as needed (Nausea or vomiting).   No current facility-administered medications on file prior to visit.      Allergies:  Allergies  Allergen Reactions  . Adalimumab Other (See Comments)    "blacked out", confused "blacked out"     Medical History:  Past Medical History:  Diagnosis Date  . Allergy   . Anemia   . Anxiety   . Arthritis   . Chest pain, non-cardiac   . COPD (chronic obstructive pulmonary disease) ([place])   . Counseling regarding goals of care 03/27/2018  . Depression   . Diverticulitis   . Diverticulosis   . Fractured pelvis ([place])    fall from ladder  . GERD (gastroesophageal reflux disease)   . Gout   . History of shingles   . Hyperlipidemia   . Internal hemorrhoids   . Iron deficiency anemia 03/13/2018  . Kidney stones   .  Other abnormal glucose 10/11/2015  . Rheumatoid arthritis ([place])    hands  . Waldenstrom's macroglobulinemia ([place]) 03/27/2018   Family history- Reviewed and unchanged Social history- Reviewed and unchanged   Review of Systems:  Review of Systems  Constitutional: Positive for malaise/fatigue. Negative for chills, fever and weight loss.  HENT: Negative for hearing loss and tinnitus.   Eyes: Negative for blurred vision and double vision.  Respiratory: Negative for cough, shortness of breath and wheezing.   Cardiovascular: Negative for chest pain, palpitations, orthopnea, claudication and leg swelling.  Gastrointestinal: Negative for abdominal pain, blood in stool, constipation, diarrhea, heartburn, melena, nausea and vomiting.  Genitourinary: Negative.   Musculoskeletal: Negative for joint pain and myalgias.  Skin: Negative for rash.  Neurological: Negative for dizziness, tingling, sensory change, weakness and headaches.  Endo/Heme/Allergies: Negative for polydipsia.   Psychiatric/Behavioral: Negative.  Negative for depression, memory loss and substance abuse. The patient is not nervous/anxious and does not have insomnia.   All other systems reviewed and are negative.   Physical Exam: BP 112/80   Pulse (!) 46   Temp (!) 97.5 F (36.4 C)   Wt 180 lb (81.6 kg)   SpO2 99%   BMI 24.41 kg/m  Wt Readings from Last 3 Encounters:  01/20/19 180 lb (81.6 kg)  12/29/18 174 lb (78.9 kg)  12/22/18 175 lb (79.4 kg)   General Appearance: Well nourished, in no apparent distress. Eyes: PERRLA, EOMs, conjunctiva no swelling or erythema Sinuses: No Frontal/maxillary tenderness ENT/Mouth: Ext aud canals clear, TMs without erythema, bulging. No erythema, swelling, or exudate on post pharynx.  Tonsils not swollen or erythematous. Hearing normal.  Neck: Supple, thyroid normal.  Respiratory: Respiratory effort normal, BS equal bilaterally without rales, rhonchi, wheezing or stridor.  Cardio: RRR with no MRGs. Brisk peripheral pulses without edema.  Abdomen: Soft, + BS.  Non tender, no guarding, rebound, hernias, masses. Lymphatics: Non tender without lymphadenopathy.  Musculoskeletal: Full ROM, 5/5 strength, Normal gait Skin: Warm, dry without rashes, lesions, ecchymosis.  Neuro: Cranial nerves intact. No cerebellar symptoms.  Psych: Awake and oriented X 3, normal affect, Insight and Judgment appropriate.    [name], NP 11:00 AM [facility]

## 2019-01-16 ENCOUNTER — Ambulatory Visit: Payer: Self-pay | Admitting: Adult Health

## 2019-01-16 ENCOUNTER — Ambulatory Visit: Payer: Self-pay | Admitting: Adult Health Nurse Practitioner

## 2019-01-16 ENCOUNTER — Other Ambulatory Visit: Payer: Self-pay | Admitting: Physician Assistant

## 2019-01-20 ENCOUNTER — Encounter: Payer: Self-pay | Admitting: Adult Health

## 2019-01-20 ENCOUNTER — Ambulatory Visit: Payer: 59 | Admitting: Adult Health

## 2019-01-20 VITALS — BP 112/80 | HR 46 | Temp 97.5°F | Wt 180.0 lb

## 2019-01-20 DIAGNOSIS — Z79899 Other long term (current) drug therapy: Secondary | ICD-10-CM

## 2019-01-20 DIAGNOSIS — C880 Waldenstrom macroglobulinemia: Secondary | ICD-10-CM | POA: Diagnosis not present

## 2019-01-20 DIAGNOSIS — Z6824 Body mass index (BMI) 24.0-24.9, adult: Secondary | ICD-10-CM

## 2019-01-20 DIAGNOSIS — I10 Essential (primary) hypertension: Secondary | ICD-10-CM

## 2019-01-20 DIAGNOSIS — F331 Major depressive disorder, recurrent, moderate: Secondary | ICD-10-CM

## 2019-01-20 DIAGNOSIS — D500 Iron deficiency anemia secondary to blood loss (chronic): Secondary | ICD-10-CM

## 2019-01-20 DIAGNOSIS — E559 Vitamin D deficiency, unspecified: Secondary | ICD-10-CM | POA: Diagnosis not present

## 2019-01-20 DIAGNOSIS — E782 Mixed hyperlipidemia: Secondary | ICD-10-CM

## 2019-01-20 DIAGNOSIS — R7309 Other abnormal glucose: Secondary | ICD-10-CM

## 2019-01-20 DIAGNOSIS — F419 Anxiety disorder, unspecified: Secondary | ICD-10-CM

## 2019-01-20 MED ORDER — BUSPIRONE HCL 7.5 MG PO TABS: ORAL_TABLET | ORAL | 1 refills | Status: DC

## 2019-01-20 NOTE — Patient Instructions (Signed)
Goals    . Blood Pressure < 130/80       Monitor heart rate - cut bisoprolol to 1/2 if consistently in the 40s  Call if blood pressure is above goal with dose reduction  We may transition back to losartan   Bradycardia, Adult Bradycardia is a slower-than-normal heartbeat. A normal resting heart rate for an adult ranges from 60 to 100 beats per minute. With bradycardia, the resting heart rate is less than 60 beats per minute. Bradycardia can prevent enough oxygen from reaching certain areas of your body when you are active. It can be serious if it keeps enough oxygen from reaching your brain and other parts of your body. Bradycardia is not a problem for everyone. For some healthy adults, a slow resting heart rate is normal. What are the causes? This condition may be caused by:  A problem with the heart, including: ? A problem with the heart's electrical system, such as a heart block. With a heart block, electrical signals between the chambers of the heart are partially or completely blocked, so they are not able to work as they should. ? A problem with the heart's natural pacemaker (sinus node). ? Heart disease. ? A heart attack. ? Heart damage. ? Lyme disease. ? A heart infection. ? A heart condition that is present at birth (congenital heart defect).  Certain medicines that treat heart conditions.  Certain conditions, such as hypothyroidism and obstructive sleep apnea.  Problems with the balance of chemicals and other substances, like potassium, in the blood.  Trauma.  Radiation therapy. What increases the risk? You are more likely to develop this condition if you:  Are age 85 or older.  Have high blood pressure (hypertension), high cholesterol (hyperlipidemia), or diabetes.  Drink heavily, use tobacco or nicotine products, or use drugs. What are the signs or symptoms? Symptoms of this condition include:  Light-headedness.  Feeling faint or fainting.  Fatigue and  weakness.  Trouble with activity or exercise.  Shortness of breath.  Chest pain (angina).  Drowsiness.  Confusion.  Dizziness. How is this diagnosed? This condition may be diagnosed based on:  Your symptoms.  Your medical history.  A physical exam. During the exam, your health care provider will listen to your heartbeat and check your pulse. To confirm the diagnosis, your health care provider may order tests, such as:  Blood tests.  An electrocardiogram (ECG). This test records the heart's electrical activity. The test can show how fast your heart is beating and whether the heartbeat is steady.  A test in which you wear a portable device (event recorder or Holter monitor) to record your heart's electrical activity while you go about your day.  Anexercise test. How is this treated? Treatment for this condition depends on the cause of the condition and how severe your symptoms are. Treatment may involve:  Treatment of the underlying condition.  Changing your medicines or how much medicine you take.  Having a small, battery-operated device called a pacemaker implanted under the skin. When bradycardia occurs, this device can be used to increase your heart rate and help your heart beat in a regular rhythm. Follow these instructions at home: Lifestyle   Manage any health conditions that contribute to bradycardia as told by your health care provider.  Follow a heart-healthy diet. A nutrition specialist (dietitian) can help educate you about healthy food options and changes.  Follow an exercise program that is approved by your health care provider.  Maintain a healthy  weight.  Try to reduce or manage your stress, such as with yoga or meditation. If you need help reducing stress, ask your health care provider.  Do not use any products that contain nicotine or tobacco, such as cigarettes, e-cigarettes, and chewing tobacco. If you need help quitting, ask your health care  provider.  Do not use illegal drugs.  Limit alcohol intake to no more than 1 drink a day for nonpregnant women and 2 drinks a day for men. Be aware of how much alcohol is in your drink. In the [place] one drink equals one 12 oz bottle of beer (355 mL), one 5 oz glass of wine (148 mL), or one 1 oz glass of hard liquor (44 mL). General instructions  Take over-the-counter and prescription medicines only as told by your health care provider.  Keep all follow-up visits as told by your health care provider. This is important. How is this prevented? In some cases, bradycardia may be prevented by:  Treating underlying medical problems.  Stopping behaviors or medicines that can trigger the condition. Contact a health care provider if you:  Feel light-headed or dizzy.  Almost faint.  Feel weak or are easily fatigued during physical activity.  Experience confusion or have memory problems. Get help right away if:  You faint.  You have: ? An irregular heartbeat (palpitations). ? Chest pain. ? Trouble breathing. Summary  Bradycardia is a slower-than-normal heartbeat. With bradycardia, the resting heart rate is less than 60 beats per minute.  Treatment for this condition depends on the cause.  Manage any health conditions that contribute to bradycardia as told by your health care provider.  Do not use any products that contain nicotine or tobacco, such as cigarettes, e-cigarettes, and chewing tobacco, and limit alcohol intake.  Keep all follow-up visits as told by your health care provider. This is important. This information is not intended to replace advice given to you by your health care provider. Make sure you discuss any questions you have with your health care provider. Document Released: 08/04/2002 Document Revised: 04/23/2018 Document Reviewed: 04/23/2018 Elsevier Interactive Patient Education  2019 [employer].

## 2019-01-21 ENCOUNTER — Encounter: Payer: Self-pay | Admitting: Adult Health

## 2019-01-21 ENCOUNTER — Ambulatory Visit (INDEPENDENT_AMBULATORY_CARE_PROVIDER_SITE_OTHER): Payer: 59 | Admitting: Adult Health

## 2019-01-21 ENCOUNTER — Other Ambulatory Visit: Payer: Self-pay | Admitting: Adult Health

## 2019-01-21 ENCOUNTER — Ambulatory Visit
Admission: RE | Admit: 2019-01-21 | Discharge: 2019-01-21 | Disposition: A | Discharge status: Home / Self Care | Payer: 59 | Source: Ambulatory Visit | Attending: Adult Health | Admitting: Adult Health

## 2019-01-21 VITALS — BP 162/94 | HR 48 | Temp 97.5°F | Wt 179.6 lb

## 2019-01-21 DIAGNOSIS — F411 Generalized anxiety disorder: Secondary | ICD-10-CM

## 2019-01-21 DIAGNOSIS — I10 Essential (primary) hypertension: Secondary | ICD-10-CM

## 2019-01-21 DIAGNOSIS — Z87891 Personal history of nicotine dependence: Secondary | ICD-10-CM

## 2019-01-21 DIAGNOSIS — R0609 Other forms of dyspnea: Secondary | ICD-10-CM

## 2019-01-21 DIAGNOSIS — R001 Bradycardia, unspecified: Secondary | ICD-10-CM | POA: Diagnosis not present

## 2019-01-21 DIAGNOSIS — F430 Acute stress reaction: Secondary | ICD-10-CM

## 2019-01-21 LAB — COMPLETE METABOLIC PANEL WITH GFR
AG Ratio: 1.9 (calc) (ref 1.0–2.5)
ALKALINE PHOSPHATASE (APISO): 76 U/L (ref 35–144)
ALT: 10 U/L (ref 9–46)
AST: 16 U/L (ref 10–35)
Albumin: 4.3 g/dL (ref 3.6–5.1)
BUN: 17 mg/dL (ref 7–25)
CO2: 25 mmol/L (ref 20–32)
Calcium: 9.5 mg/dL (ref 8.6–10.3)
Chloride: 105 mmol/L (ref 98–110)
Creat: 1.23 mg/dL (ref 0.70–1.33)
GFR, Est African American: 74 mL/min/{1.73_m2} (ref >=60)
GFR, Est Non African American: 64 mL/min/{1.73_m2} (ref >=60)
Globulin: 2.3 g/dL (calc) (ref 1.9–3.7)
Glucose, Bld: 74 mg/dL (ref 65–99)
Potassium: 4.7 mmol/L (ref 3.5–5.3)
Sodium: 142 mmol/L (ref 135–146)
Total Bilirubin: 0.7 mg/dL (ref 0.2–1.2)
Total Protein: 6.6 g/dL (ref 6.1–8.1)

## 2019-01-21 LAB — CBC WITH DIFFERENTIAL/PLATELET
Absolute Monocytes: 769 cells/uL (ref 200–950)
Basophils Absolute: 49 cells/uL (ref 0–200)
Basophils Relative: 1 %
EOS ABS: 152 {cells}/uL (ref 15–500)
Eosinophils Relative: 3.1 %
HCT: 39 % (ref 38.5–50.0)
Hemoglobin: 13.1 g/dL — ABNORMAL LOW (ref 13.2–17.1)
Lymphs Abs: 931 cells/uL (ref 850–3900)
MCH: 32.2 pg (ref 27.0–33.0)
MCHC: 33.6 g/dL (ref 32.0–36.0)
MCV: 95.8 fL (ref 80.0–100.0)
MONOS PCT: 15.7 %
MPV: 11.8 fL (ref 7.5–12.5)
Neutro Abs: 2999 cells/uL (ref 1500–7800)
Neutrophils Relative %: 61.2 %
Platelets: 247 10*3/uL (ref 140–400)
RBC: 4.07 10*6/uL — ABNORMAL LOW (ref 4.20–5.80)
RDW: 13.9 % (ref 11.0–15.0)
Total Lymphocyte: 19 %
WBC: 4.9 10*3/uL (ref 3.8–10.8)

## 2019-01-21 LAB — VITAMIN D 25 HYDROXY (VIT D DEFICIENCY, FRACTURES): VIT D 25 HYDROXY: 47 ng/mL (ref 30–100)

## 2019-01-21 LAB — LIPID PANEL
Cholesterol: 212 mg/dL — ABNORMAL HIGH (ref <200)
HDL: 42 mg/dL (ref >=40)
LDL Cholesterol (Calc): 151 mg/dL (calc) — ABNORMAL HIGH
Non-HDL Cholesterol (Calc): 170 mg/dL (calc) — ABNORMAL HIGH (ref <130)
Total CHOL/HDL Ratio: 5 (calc) — ABNORMAL HIGH (ref <5.0)
Triglycerides: 83 mg/dL (ref <150)

## 2019-01-21 LAB — TSH: TSH: 2.4 m[IU]/L (ref 0.40–4.50)

## 2019-01-21 MED ORDER — ROSUVASTATIN CALCIUM 40 MG PO TABS: 40.0000 mg | ORAL_TABLET | Freq: Every day | ORAL | 1 refills | Status: DC

## 2019-01-21 NOTE — Progress Notes (Signed)
Assessment and Plan:  Diagnoses and all orders for this visit:  Bradycardia [place] on EKG, no heart block Will taper BB to d/c - bisoprolol 5 mg x 5 days, then d/c Monitor rate closely Follow up in 10 days due to severe anxiety and limited health literacy r/t this issue, or sooner if needed -     EKG 12-Lead  Essential hypertension Taper to d/c bisoprolol; cautioned to not stop BB abruptly Restart losartan; initiate 50 mg daily, increase to 100 mg if needed for BP goal <140/90 at this time Monitor blood pressure at home; call if consistently over 140/90 Continue DASH diet.   Reminder to go to the ER if any CP, SOB, nausea, dizziness, severe HA, changes vision/speech, left arm numbness and tingling and jaw pain.  Other form of dyspnea/ Former smoker Last CXR remote; exam benign, I suspect anxiety may be contributing more than true underlying pathology at this time; will obtain CXR as last remote; he is to call with any changes -     DG Chest 2 View; Future -     EKG 12-Lead  Anxiety as acute reaction to exceptional stress Continue lexapro, increase buspar as discussed yesterday, disucssed appropriate use of xanax (0.25-0.5 mg TID PRN), wife is here with him and will assist with taking medications appropriately Suggested therapy may be beneficial r/t new diagnosis - he is very resistant Recommended need to limit stress within home - discussed possible strategies Review deep breathing exercises  Follow up closely in 10 days to review improvement with buspar dose increase  Further disposition pending results of labs. Discussed med's effects and SE's.   Over 30 minutes of exam, counseling, chart review, and critical decision making was performed.   Future Appointments  Date Time Provider [place]  03/24/2019  9:00 AM [facility] None  03/24/2019  9:30 AM Ennever, Rudell Cobb, MD [facility] None  04/23/2019  9:30 AM Unk Pinto, MD [facility] None     ------------------------------------------------------------------------------------------------------------------   HPI BP (!) 162/94   Pulse (!) 48   Temp (!) 97.5 F (36.4 C)   Wt 179 lb 9.6 oz (81.5 kg)   SpO2 97%   BMI 24.36 kg/m   58 y.o.male with Waldenstrom's, anxiety, htn, presents for concern about low pulse; he was just seen yesterday for routine OV and discussed this at the time; he had been recommended to monitor pulse rate and cut bisoprolol in 1/2 (5 mg) and continue to monitor with plan to d/c if bradycardia persisted. Today he presents accompanied by his wife reporting he didn't take any of his blood pressure medications due to "low pulse in the 40s." On review he has intermittently had a pulse at this rate over the past year. He is very anxious, and additionally reports being very concerned about intermittent need to "take a really deep breath." He denies chest pain, dyspnea, wheezing, activity intolerance, cough, PND, edema, dizziness, nausea. He reports the sensation goes away quickly after taking a deep slow breath. He is a former smoker, last CXR in 2011 demonstrated mild COPD changes.   The patient has notably been very anxious regarding new dx of Waldenstrom's this past year (followed by Dr. Marin Olp). We discussed anxiety yesterday and adjusted medications. He also admits to high level of stress at home, daughter is possibly bipolar, and he and his wife watch grandson 3-5 afternoons of the week, lots of yelling and stress r/t this.   Past Medical History:  Diagnosis Date  .  Allergy   . Anemia   . Anxiety   . Arthritis   . Chest pain, non-cardiac   . COPD (chronic obstructive pulmonary disease) ([place])   . Counseling regarding goals of care 03/27/2018  . Depression   . Diverticulitis   . Diverticulosis   . Fractured pelvis ([place])    fall from ladder  . GERD (gastroesophageal reflux disease)   . Gout   . History of shingles   . Hyperlipidemia   . Internal  hemorrhoids   . Iron deficiency anemia 03/13/2018  . Kidney stones   . Other abnormal glucose 10/11/2015  . Rheumatoid arthritis ([place])    hands  . 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 Adalimumab Other (See Comments)    "blacked out", confused "blacked out"    Current Outpatient Medications on File Prior to Visit  Medication Sig  . ALPRAZolam (XANAX) 1 MG tablet Take 1/2 to 1 tablet 2 to 3 x /day ONLY if needed for Anxiety Attack(s)  . aspirin EC 81 MG tablet Take 81 mg daily by mouth.  . bisoprolol (ZEBETA) 10 MG tablet TAKE 1/2 TO 1 TABLET DAILY FOR BP  . busPIRone (BUSPAR) 7.5 MG tablet Take 1 tab 3 times a day for anxiety.  . Cholecalciferol (VITAMIN D-3) 5000 UNITS TABS Take 10,000 Units by mouth daily.   Marland Kitchen escitalopram (LEXAPRO) 20 MG tablet Take 20 mg by mouth daily.   . famciclovir (FAMVIR) 500 MG tablet Take 1 tablet (500 mg total) by mouth daily.  . famotidine (PEPCID) 40 MG tablet Take 1/2-1 tab with ibuprofen as needed.  . fluticasone (FLONASE) 50 MCG/ACT nasal spray PLACE 2 SPRAYS INTO BOTH NOSTRILS DAILY.  Takes prn.  . folic acid (FOLVITE) 1 MG tablet Take 1 mg by mouth daily.  . Ibrutinib (IMBRUVICA) 420 MG TABS Take 420 mg by mouth daily.  Marland Kitchen ibuprofen (ADVIL,MOTRIN) 800 MG tablet Take 1 tablet (800 mg total) by mouth daily as needed.  Marland Kitchen levocetirizine (XYZAL) 5 MG tablet TAKE 1 TABLET (5 MG TOTAL) BY MOUTH EVERY EVENING.  Marland Kitchen losartan (COZAAR) 100 MG tablet Take 1 tablet at bedtime for BP  . meclizine (ANTIVERT) 25 MG tablet Take 1 tablet (25 mg total) by mouth 3 (three) times daily as needed for dizziness.  Marland Kitchen omeprazole-sodium bicarbonate (ZEGERID) 40-1100 MG capsule TAKE 1 CAPSULE BY MOUTH EVERY DAY BEFORE BREAKFAST  . rosuvastatin (CRESTOR) 40 MG tablet Take 1 tablet (40 mg total) by mouth daily. Take in the evening for cholesterol.  Marland Kitchen tiZANidine (ZANAFLEX) 4 MG tablet Take 1 tablet (4 mg total) by mouth every 6 (six)  hours as needed for muscle spasms.  . [DISCONTINUED] prochlorperazine (COMPAZINE) 10 MG tablet Take 1 tablet (10 mg total) by mouth every 6 (six) hours as needed (Nausea or vomiting).   No current facility-administered medications on file prior to visit.     ROS: all negative except above.   Physical Exam:  BP (!) 162/94   Pulse (!) 48   Temp (!) 97.5 F (36.4 C)   Wt 179 lb 9.6 oz (81.5 kg)   SpO2 97%   BMI 24.36 kg/m   General Appearance: Well nourished, in no apparent distress. Eyes: PERRLA, conjunctiva no swelling or erythema ENT/Mouth: No erythema, swelling, or exudate on post pharynx.  Tonsils not swollen or erythematous. Hearing normal.  Neck: Supple Respiratory: Respiratory effort normal, BS equal bilaterally without rales, rhonchi, wheezing or stridor.  Cardio: regular rhythm,  bradycardic, with no MRGs. Brisk peripheral pulses without edema.  Abdomen: Soft, + BS.  Non tender, no guarding, rebound, hernias, masses. Lymphatics: Non tender without lymphadenopathy.  Musculoskeletal: Full ROM, 5/5 strength, normal gait.  Skin: Warm, dry without rashes, lesions, ecchymosis.  Neuro: Cranial nerves intact. Normal muscle tone, no cerebellar symptoms. Sensation intact.  Psych: Awake and oriented X 3, anxious/tearful affect, Insight and Judgment fair.     [name], NP 9:46 AM [facility]

## 2019-01-21 NOTE — Patient Instructions (Signed)
Please take 1/2 tab bisoprolol for 5 days then stop taking  Monitor blood pressure; take 1/2 tab losartan for blood pressure 140/90+, then increase to full tab if needed in a few days  Monitor breathing closely; let me know if having cough, chest pain, wheezing, fatigue, or associated with exertion    Deep breath in through your nose, and slow exhale through your mouth over 7 seconds. Imagine all the muscles in your body relaxing as you exhale. Repeat a few times.   Try to work on stress levels in house     Shortness of Breath, Adult Shortness of breath is when a person has trouble breathing enough air or when a person feels like she or he is having trouble breathing in enough air. Shortness of breath could be a sign of a medical problem. Follow these instructions at home:   Pay attention to any changes in your symptoms.  Do not use any products that contain nicotine or tobacco, such as cigarettes, e-cigarettes, and chewing tobacco.  Do not smoke. Smoking is a common cause of shortness of breath. If you need help quitting, ask your health care provider.  Avoid things that can irritate your airways, such as: ? Mold. ? Dust. ? Air pollution. ? Chemical fumes. ? Things that can cause allergy symptoms (allergens), if you have allergies.  Keep your living space clean and free of mold and dust.  Rest as needed. Slowly return to your usual activities.  Take over-the-counter and prescription medicines only as told by your health care provider. This includes oxygen therapy and inhaled medicines.  Keep all follow-up visits as told by your health care provider. This is important. Contact a health care provider if:  Your condition does not improve as soon as expected.  You have a hard time doing your normal activities, even after you rest.  You have new symptoms. Get help right away if:  Your shortness of breath gets worse.  You have shortness of breath when you are  resting.  You feel light-headed or you faint.  You have a cough that is not controlled with medicines.  You cough up blood.  You have pain with breathing.  You have pain in your chest, arms, shoulders, or abdomen.  You have a fever.  You cannot walk up stairs or exercise the way that you normally do. These symptoms may represent a serious problem that is an emergency. Do not wait to see if the symptoms will go away. Get medical help right away. Call your local emergency services (911 in the [place] Do not drive yourself to the hospital. Summary  Shortness of breath is when a person has trouble breathing enough air. It can be a sign of a medical problem.  Avoid things that irritate your lungs, such as smoking, pollution, mold, and dust.  Pay attention to changes in your symptoms and contact your health care provider if you have a hard time completing daily activities because of shortness of breath. This information is not intended to replace advice given to you by your health care provider. Make sure you discuss any questions you have with your health care provider. Document Released: 08/07/2001 Document Revised: 04/14/2018 Document Reviewed: 04/14/2018 Elsevier Interactive Patient Education  2019 [employer].

## 2019-01-27 ENCOUNTER — Ambulatory Visit: Payer: 59 | Admitting: Adult Health

## 2019-01-27 ENCOUNTER — Encounter: Payer: Self-pay | Admitting: Adult Health

## 2019-01-27 VITALS — BP 110/76 | HR 51 | Temp 97.5°F | Wt 180.0 lb

## 2019-01-27 DIAGNOSIS — R21 Rash and other nonspecific skin eruption: Secondary | ICD-10-CM | POA: Diagnosis not present

## 2019-01-27 DIAGNOSIS — I10 Essential (primary) hypertension: Secondary | ICD-10-CM | POA: Diagnosis not present

## 2019-01-27 DIAGNOSIS — R001 Bradycardia, unspecified: Secondary | ICD-10-CM

## 2019-01-27 DIAGNOSIS — F419 Anxiety disorder, unspecified: Secondary | ICD-10-CM

## 2019-01-27 MED ORDER — TRIAMCINOLONE ACETONIDE 0.1 % EX OINT: 1.0000 "application " | TOPICAL_OINTMENT | Freq: Two times a day (BID) | CUTANEOUS | 1 refills | Status: DC

## 2019-01-27 NOTE — Progress Notes (Signed)
Assessment and Plan:  Horatio was seen today for acute visit.  Diagnoses and all orders for this visit:  Rash and nonspecific skin eruption Unclear etiology; appears abrasion-like; advised to avoid friction of the area, stop scratching, trial topical benadryl PRN itching, monitor progress for now, not strongly suggestive of contact dermatitis or infectious etiology, ? R/t Waldenstrom's & treatment Follow up as scheduled next week and will re-evaluate -     triamcinolone ointment (KENALOG) 0.1 %; Apply 1 application topically 2 (two) times daily.  Anxiety Significantly improved with recent med adjustment; continue medications Stress management techniques discussed, increase water, good sleep hygiene discussed, increase exercise, and increase veggies.   Essential hypertension/ Bradycardia Continue taper off of bisoprolol; hold off on starting losartan for now, monitor BP; start only if 130/80+ Monitor blood pressure at home; call if consistently over 130/80 Continue DASH diet.   Reminder to go to the ER if any CP, SOB, nausea, dizziness, severe HA, changes vision/speech, left arm numbness and tingling and jaw pain.   Further disposition pending results of labs. Discussed med's effects and SE's.   Over 15 minutes of exam, counseling, chart review, and critical decision making was performed.   Future Appointments  Date Time Provider [place]  02/02/2019  3:30 PM Liane Comber, NP [facility] None  03/24/2019  9:00 AM [facility] None  03/24/2019  9:30 AM Ennever, Rudell Cobb, MD [facility] None  04/23/2019  9:30 AM Unk Pinto, MD [facility] None    ------------------------------------------------------------------------------------------------------------------  HPI BP 110/76   Pulse (!) 51   Temp (!) 97.5 F (36.4 C)   Wt 180 lb (81.6 kg)   SpO2 96%   BMI 24.41 kg/m   58 y.o.male with presents for evaluation of rash of bilateral shins x 3 days; he noted the rash  when he got into the shower and noted burning sensation; rash has since stayed about the same since then. He reports area is very pruritic. Burning with contact with clothing or water in shower. No unusual activities, no new detergents. Denies myalgias/arthralgias, URI sx, headache. He feels well otherwise. He has tried applying various moisturizers and non-medicated creams without improvement.   He takes xyzal daily, not sure if helping with itching.   He was recently seen for anxiety and medications were adjusted; he is taking lexapro 20 mg daily, buspar 7.5 mg TID, has xanax 0.25-0.5 mg PRN. He reports he is doing much better since increase of buspar frequency, hasn't needed xanax. He is very pleased with his progress.   Past Medical History:  Diagnosis Date  . Allergy   . Anemia   . Anxiety   . Arthritis   . Chest pain, non-cardiac   . COPD (chronic obstructive pulmonary disease) ([place])   . Counseling regarding goals of care 03/27/2018  . Depression   . Diverticulitis   . Diverticulosis   . Fractured pelvis ([place])    fall from ladder  . GERD (gastroesophageal reflux disease)   . Gout   . History of shingles   . Hyperlipidemia   . Internal hemorrhoids   . Iron deficiency anemia 03/13/2018  . Kidney stones   . Other abnormal glucose 10/11/2015  . Rheumatoid arthritis ([place])    hands  . 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 Adalimumab Other (See Comments)    "blacked out", confused "blacked out"    Current Outpatient Medications on File Prior to Visit  Medication Sig  .  ALPRAZolam (XANAX) 1 MG tablet Take 1/2 to 1 tablet 2 to 3 x /day ONLY if needed for Anxiety Attack(s)  . aspirin EC 81 MG tablet Take 81 mg daily by mouth.  . busPIRone (BUSPAR) 7.5 MG tablet Take 1 tab 3 times a day for anxiety.  . Cholecalciferol (VITAMIN D-3) 5000 UNITS TABS Take 10,000 Units by mouth daily.   Marland Kitchen escitalopram (LEXAPRO) 20 MG tablet Take 20 mg by  mouth daily.   . famciclovir (FAMVIR) 500 MG tablet Take 1 tablet (500 mg total) by mouth daily.  . famotidine (PEPCID) 40 MG tablet Take 1/2-1 tab with ibuprofen as needed.  . fluticasone (FLONASE) 50 MCG/ACT nasal spray PLACE 2 SPRAYS INTO BOTH NOSTRILS DAILY.  Takes prn.  . folic acid (FOLVITE) 1 MG tablet Take 1 mg by mouth daily.  . Ibrutinib (IMBRUVICA) 420 MG TABS Take 420 mg by mouth daily.  Marland Kitchen ibuprofen (ADVIL,MOTRIN) 800 MG tablet Take 1 tablet (800 mg total) by mouth daily as needed.  Marland Kitchen levocetirizine (XYZAL) 5 MG tablet TAKE 1 TABLET (5 MG TOTAL) BY MOUTH EVERY EVENING.  Marland Kitchen losartan (COZAAR) 100 MG tablet Take 1 tablet at bedtime for BP  . omeprazole-sodium bicarbonate (ZEGERID) 40-1100 MG capsule TAKE 1 CAPSULE BY MOUTH EVERY DAY BEFORE BREAKFAST  . rosuvastatin (CRESTOR) 40 MG tablet Take 1 tablet (40 mg total) by mouth daily. Take in the evening for cholesterol.  . [DISCONTINUED] prochlorperazine (COMPAZINE) 10 MG tablet Take 1 tablet (10 mg total) by mouth every 6 (six) hours as needed (Nausea or vomiting).   No current facility-administered medications on file prior to visit.     ROS: all negative except above.   Physical Exam:  BP 110/76   Pulse (!) 51   Temp (!) 97.5 F (36.4 C)   Wt 180 lb (81.6 kg)   SpO2 96%   BMI 24.41 kg/m   General Appearance: Well nourished, in no apparent distress. Eyes: PERRLA, EOMs, conjunctiva no swelling or erythema Sinuses: No Frontal/maxillary tenderness ENT/Mouth: Ext aud canals clear, TMs without erythema, bulging. No erythema, swelling, or exudate on post pharynx.  Tonsils not swollen or erythematous. Hearing normal.  Neck: Supple, thyroid normal.  Respiratory: Respiratory effort normal, BS equal bilaterally without rales, rhonchi, wheezing or stridor.  Cardio: RRR with no MRGs. Brisk peripheral pulses without edema.  Abdomen: Soft, + BS.  Non tender, no guarding, rebound, hernias, masses. Lymphatics: Non tender without  lymphadenopathy.  Musculoskeletal: Symmetrical strength, normal gait.  Skin: Warm, dry without ecchymosis. He has red scattered rash, abrasion/razorburn-like bilateral shins without vesicles, discharge.  Neuro: Normal muscle tone, no cerebellar symptoms. Sensation intact.  Psych: Awake and oriented X 3, normal affect, Insight and Judgment appropriate.     [name], NP 11:01 AM [facility]

## 2019-01-27 NOTE — Patient Instructions (Addendum)
Goals    . Blood Pressure < 130/80       Stop bisoprolol this Friday as planned - monitor blood pressure and heart rate - don't start losartan unless blood pressure is 130/80+  Avoid scratching   Look for topical benadryl gel for itching   Monitor rash let me know if anything new/different     Rash, Adult A rash is a change in the color of your skin. A rash can also change the way your skin feels. There are many different conditions and factors that can cause a rash. Some rashes may disappear after a few days, but some may last for a few weeks. Common causes of rashes include:  Viral infections, such as: ? Colds. ? Measles. ? Hand, foot, and mouth disease.  Bacterial infections, such as: ? Scarlet fever. ? Impetigo.  Fungal infections, such as Candida.  Allergic reactions to food, medicines, or skin care products. Follow these instructions at home: The goal of treatment is to stop the itching and keep the rash from spreading. Pay attention to any changes in your symptoms. Follow these instructions to help with your condition: Medicine Take or apply over-the-counter and prescription medicines only as told by your health care provider. These may include:  Corticosteroid creams to treat red or swollen skin.  Anti-itch lotions.  Oral allergy medicines (antihistamines).  Oral corticosteroids for severe symptoms.  Skin care  Apply cool compresses to the affected areas.  Do not scratch or rub your skin.  Avoid covering the rash. Make sure the rash is exposed to air as much as possible. Managing itching and discomfort  Avoid hot showers or baths, which can make itching worse. A cold shower may help.  Try taking a bath with: ? Epsom salts. Follow manufacturer instructions on the packaging. You can get these at your local pharmacy or grocery store. ? Baking soda. Pour a small amount into the bath as told by your health care provider. ? Colloidal oatmeal. Follow  manufacturer instructions on the packaging. You can get this at your local pharmacy or grocery store.  Try applying baking soda paste to your skin. Stir water into baking soda until it reaches a paste-like consistency.  Try applying calamine lotion. This is an over-the-counter lotion that helps to relieve itchiness.  Keep cool and out of the sun. Sweating and being hot can make itching worse. General instructions   Rest as needed.  Drink enough fluid to keep your urine pale yellow.  Wear loose-fitting clothing.  Avoid scented soaps, detergents, and perfumes. Use gentle soaps, detergents, perfumes, and other cosmetic products.  Avoid any substance that causes your rash. Keep a journal to help track what causes your rash. Write down: ? What you eat. ? What cosmetic products you use. ? What you drink. ? What you wear. This includes jewelry.  Keep all follow-up visits as told by your health care provider. This is important. Contact a health care provider if:  You sweat at night.  You lose weight.  You urinate more than normal.  You urinate less than normal, or you notice that your urine is a darker color than usual.  You feel weak.  You vomit.  Your skin or the whites of your eyes look yellow (jaundice).  Your skin: ? Tingles. ? Is numb.  Your rash: ? Does not go away after several days. ? Gets worse.  You are: ? Unusually thirsty. ? More tired than normal.  You have: ? New symptoms. ?  Pain in your abdomen. ? A fever. ? Diarrhea. Get help right away if you:  Have a fever and your symptoms suddenly get worse.  Develop confusion.  Have a severe headache or a stiff neck.  Have severe joint pains or stiffness.  Have a seizure.  Develop a rash that covers all or most of your body. The rash may or may not be painful.  Develop blisters that: ? Are on top of the rash. ? Grow larger or grow together. ? Are painful. ? Are inside your nose or  mouth.  Develop a rash that: ? Looks like purple pinprick-sized spots all over your body. ? Has a "bull's eye" or looks like a target. ? Is not related to sun exposure, is red and painful, and causes your skin to peel. Summary  A rash is a change in the color of your skin. Some rashes disappear after a few days, but some may last for a few weeks.  The goal of treatment is to stop the itching and keep the rash from spreading.  Take or apply over-the-counter and prescription medicines only as told by your health care provider.  Contact a health care provider if you have new or worsening symptoms.  Keep all follow-up visits as told by your health care provider. This is important. This information is not intended to replace advice given to you by your health care provider. Make sure you discuss any questions you have with your health care provider. Document Released: 11/02/2002 Document Revised: 06/16/2018 Document Reviewed: 06/16/2018 Elsevier Interactive Patient Education  2019 [employer].

## 2019-01-28 ENCOUNTER — Other Ambulatory Visit: Payer: Self-pay | Admitting: Hematology & Oncology

## 2019-01-29 MED FILL — IMBRUVICA 420 MG TAB: 420 | 28 days supply | Qty: 28 | Fill #0

## 2019-01-30 NOTE — Progress Notes (Signed)
Assessment and Plan:  Edwin Webster was seen today for acute visit.  Diagnoses and all orders for this visit:  Rash and nonspecific skin eruption Nearly resolved with topical steroid; continue to resolution; call with any changes -     triamcinolone ointment (KENALOG) 0.1 %; Apply 1 application topically 2 (two) times daily.  Anxiety Significantly improved with recent med adjustment; continue medications Stress management techniques discussed, increase water, good sleep hygiene discussed, increase exercise, and increase veggies.   Essential hypertension/ Bradycardia BPs well controlled off of BP medication; continue monitoring at home, start losartan 50 mg daily if consistently above goal of 140/90; stay off medication for now  Monitor blood pressure at home; call if consistently over 140/90 Continue DASH diet.   Reminder to go to the ER if any CP, SOB, nausea, dizziness, severe HA, changes vision/speech, left arm numbness and tingling and jaw pain  Further disposition pending results of labs. Discussed med's effects and SE's.   Over 15 minutes of exam, counseling, chart review, and critical decision making was performed.   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 He reports he is doing much better since increase of buspar frequency, hasn't needed xanax. He remains very pleased with his progress and states "I feel better than I have in a long time."    He was very concerned regarding his pulse rate of 48 at previous visit, and in light of reported fatigue (EKG was unremarkable save sinus brady) we discontinued previous bisoprolol 10 mg by slowly taper. Losartan was prescribed to replace but in light of low BPs will defer having him start. BP well controlled at this time, today their BP is BP: 110/70. Home logs show BPs at goal <140/90 consistently.  He denies chest pain, shortness of breath, dizziness.  Previous rash to bilateral shins nearly resolved with topical steroid which he will continue to resolution.     Past Medical History:  Diagnosis Date  . Allergy   . Anemia   . Anxiety   . Arthritis   . Chest pain, non-cardiac   . COPD (chronic obstructive pulmonary disease) ([place])   . Counseling regarding goals of care 03/27/2018  . Depression   . Diverticulitis   . Diverticulosis   . Fractured pelvis ([place])    fall from ladder  . GERD (gastroesophageal reflux disease)   . Gout   . History of shingles   . Hyperlipidemia   . Internal hemorrhoids   . Iron deficiency anemia 03/13/2018  . Kidney stones   . Other abnormal glucose 10/11/2015  . Rheumatoid arthritis ([place])    hands  . Waldenstrom's macroglobulinemia ([place]) 03/27/2018     Allergies  Allergen Reactions  . Adalimumab Other (See Comments)    "blacked out", confused "blacked out"  Current Outpatient Medications on File Prior to Visit  Medication Sig  . ALPRAZolam (XANAX) 1 MG tablet Take 1/2 to 1 tablet 2 to 3 x /day ONLY if needed for Anxiety Attack(s)  . aspirin EC 81 MG tablet Take 81 mg daily by mouth.  . busPIRone (BUSPAR) 7.5 MG tablet Take 1 tab 3 times a day for anxiety.  . Cholecalciferol (VITAMIN D-3) 5000 UNITS TABS Take 10,000 Units by mouth daily.   Marland Kitchen  escitalopram (LEXAPRO) 20 MG tablet Take 20 mg by mouth daily.   . famciclovir (FAMVIR) 500 MG tablet Take 1 tablet (500 mg total) by mouth daily.  . famotidine (PEPCID) 40 MG tablet Take 1/2-1 tab with ibuprofen as needed.  . fluticasone (FLONASE) 50 MCG/ACT nasal spray PLACE 2 SPRAYS INTO BOTH NOSTRILS DAILY.  Takes prn.  . folic acid (FOLVITE) 1 MG tablet Take 1 mg by mouth daily.  Marland Kitchen ibuprofen (ADVIL,MOTRIN) 800 MG tablet Take 1 tablet (800 mg total) by mouth daily as needed.  . IMBRUVICA 420 MG TABS TAKE 1 TABLET BY MOUTH DAILY  . levocetirizine (XYZAL) 5 MG tablet TAKE 1 TABLET (5 MG TOTAL) BY MOUTH EVERY EVENING.  Marland Kitchen omeprazole-sodium bicarbonate (ZEGERID) 40-1100 MG capsule TAKE 1 CAPSULE BY MOUTH EVERY DAY BEFORE BREAKFAST  . rosuvastatin (CRESTOR) 40 MG tablet Take 1 tablet (40 mg total) by mouth daily. Take in the evening for cholesterol.  . triamcinolone ointment (KENALOG) 0.1 % Apply 1 application topically 2 (two) times daily.  . vitamin B-12 (CYANOCOBALAMIN) 500 MCG tablet Take 500 mcg by mouth daily.  . [DISCONTINUED] prochlorperazine (COMPAZINE) 10 MG tablet Take 1 tablet (10 mg total) by mouth every 6 (six) hours as needed (Nausea or vomiting).   No current facility-administered medications on file prior to visit.     ROS: all negative except above.   Physical Exam:  BP 110/70   Pulse 61   Temp 97.7 F (36.5 C)   Wt 177 lb (80.3 kg)   SpO2 95%   BMI 24.01 kg/m   General Appearance: Well nourished, in no apparent distress. Eyes: PERRLA, EOMs, conjunctiva no swelling or erythema Sinuses: No Frontal/maxillary tenderness ENT/Mouth: Ext aud canals clear, TMs without erythema, bulging. No erythema, swelling, or exudate on post pharynx.  Tonsils not swollen or erythematous. Hearing normal.  Neck: Supple, thyroid normal.  Respiratory: Respiratory effort normal, BS equal bilaterally without rales, rhonchi, wheezing or stridor.  Cardio: RRR with no MRGs. Brisk peripheral  pulses without edema.  Abdomen: Soft, + BS.  Non tender, no guarding, rebound, hernias, masses. Lymphatics: Non tender without lymphadenopathy.  Musculoskeletal: Symmetrical strength, normal gait.  Skin: Warm, dry without ecchymosis. He has scant red scattered rash, abrasion/razorburn-like bilateral shins without vesicles, discharge.  Neuro: Normal muscle tone, no cerebellar symptoms. Sensation intact.  Psych: Awake and oriented X 3, normal affect, Insight and Judgment appropriate.     [name], NP 3:08 PM [facility]

## 2019-02-02 ENCOUNTER — Encounter: Payer: Self-pay | Admitting: Adult Health

## 2019-02-02 ENCOUNTER — Ambulatory Visit (INDEPENDENT_AMBULATORY_CARE_PROVIDER_SITE_OTHER): Payer: 59 | Admitting: Adult Health

## 2019-02-02 VITALS — BP 110/70 | HR 61 | Temp 97.7°F | Wt 177.0 lb

## 2019-02-02 DIAGNOSIS — I10 Essential (primary) hypertension: Secondary | ICD-10-CM

## 2019-02-02 DIAGNOSIS — F330 Major depressive disorder, recurrent, mild: Secondary | ICD-10-CM

## 2019-02-02 DIAGNOSIS — R001 Bradycardia, unspecified: Secondary | ICD-10-CM | POA: Diagnosis not present

## 2019-02-02 MED ORDER — LOSARTAN POTASSIUM 100 MG PO TABS: ORAL_TABLET | ORAL | 1 refills | Status: DC

## 2019-02-02 NOTE — Patient Instructions (Signed)
Goals    . Blood Pressure < 140/90     If blood pressure is consistently elevated above goal, start losartan       Take losartan 1/2 tab only if blood pressures at home are consistently above 140/90  HYPERTENSION INFORMATION  Monitor your blood pressure at home, please keep a record and bring that in with you to your next office visit.   Go to the ER if any CP, SOB, nausea, dizziness, severe HA, changes vision/speech  Your most recent BP: BP: 110/70   Take your medications faithfully as instructed. Maintain a healthy weight. Get at least 150 minutes of aerobic exercise per week. Minimize salt intake. Minimize alcohol intake  DASH Eating Plan DASH stands for "Dietary Approaches to Stop Hypertension." The DASH eating plan is a healthy eating plan that has been shown to reduce high blood pressure (hypertension). Additional health benefits may include reducing the risk of type 2 diabetes mellitus, heart disease, and stroke. The DASH eating plan may also help with weight loss. WHAT DO I NEED TO KNOW ABOUT THE DASH EATING PLAN? For the DASH eating plan, you will follow these general guidelines:  Choose foods with a percent daily value for sodium of less than 5% (as listed on the food label).  Use salt-free seasonings or herbs instead of table salt or sea salt.  Check with your health care provider or pharmacist before using salt substitutes.  Eat lower-sodium products, often labeled as "lower sodium" or "no salt added."  Eat fresh foods.  Eat more vegetables, fruits, and low-fat dairy products.  Choose whole grains. Look for the word "whole" as the first word in the ingredient list.  Choose fish and skinless chicken or [place] more often than red meat. Limit fish, poultry, and meat to 6 oz (170 g) each day.  Limit sweets, desserts, sugars, and sugary drinks.  Choose heart-healthy fats.  Limit cheese to 1 oz (28 g) per day.  Eat more home-cooked food and less restaurant,  buffet, and fast food.  Limit fried foods.  Cook foods using methods other than frying.  Limit canned vegetables. If you do use them, rinse them well to decrease the sodium.  When eating at a restaurant, ask that your food be prepared with less salt, or no salt if possible. WHAT FOODS CAN I EAT? Seek help from a dietitian for individual calorie needs. Grains Whole grain or whole wheat bread. [place] rice. Whole grain or whole wheat pasta. Quinoa, bulgur, and whole grain cereals. Low-sodium cereals. Corn or whole wheat flour tortillas. Whole grain cornbread. Whole grain crackers. Low-sodium crackers. Vegetables Fresh or frozen vegetables (raw, steamed, roasted, or grilled). Low-sodium or reduced-sodium tomato and vegetable juices. Low-sodium or reduced-sodium tomato sauce and paste. Low-sodium or reduced-sodium canned vegetables.  Fruits All fresh, canned (in natural juice), or frozen fruits. Meat and Other Protein Products Ground beef (85% or leaner), grass-fed beef, or beef trimmed of fat. Skinless chicken or [place]. Ground chicken or [place]. Pork trimmed of fat. All fish and seafood. Eggs. Dried beans, peas, or lentils. Unsalted nuts and seeds. Unsalted canned beans. Dairy Low-fat dairy products, such as skim or 1% milk, 2% or reduced-fat cheeses, low-fat ricotta or cottage cheese, or plain low-fat yogurt. Low-sodium or reduced-sodium cheeses. Fats and Oils Tub margarines without trans fats. Light or reduced-fat mayonnaise and salad dressings (reduced sodium). Avocado. Safflower, olive, or canola oils. Natural peanut or almond butter. Other Unsalted popcorn and pretzels. The items listed above may not be  a complete list of recommended foods or beverages. Contact your dietitian for more options. WHAT FOODS ARE NOT RECOMMENDED? Grains White bread. White pasta. White rice. Refined cornbread. Bagels and croissants. Crackers that contain trans fat. Vegetables Creamed or fried vegetables.  Vegetables in a cheese sauce. Regular canned vegetables. Regular canned tomato sauce and paste. Regular tomato and vegetable juices. Fruits Dried fruits. Canned fruit in light or heavy syrup. Fruit juice. Meat and Other Protein Products Fatty cuts of meat. Ribs, chicken wings, bacon, sausage, bologna, salami, chitterlings, fatback, hot dogs, bratwurst, and packaged luncheon meats. Salted nuts and seeds. Canned beans with salt. Dairy Whole or 2% milk, cream, half-and-half, and cream cheese. Whole-fat or sweetened yogurt. Full-fat cheeses or blue cheese. Nondairy creamers and whipped toppings. Processed cheese, cheese spreads, or cheese curds. Condiments Onion and garlic salt, seasoned salt, table salt, and sea salt. Canned and packaged gravies. Worcestershire sauce. Tartar sauce. Barbecue sauce. Teriyaki sauce. Soy sauce, including reduced sodium. Steak sauce. Fish sauce. Oyster sauce. Cocktail sauce. Horseradish. Ketchup and mustard. Meat flavorings and tenderizers. Bouillon cubes. Hot sauce. Tabasco sauce. Marinades. Taco seasonings. Relishes. Fats and Oils Butter, stick margarine, lard, shortening, ghee, and bacon fat. Coconut, palm kernel, or palm oils. Regular salad dressings. Other Pickles and olives. Salted popcorn and pretzels. The items listed above may not be a complete list of foods and beverages to avoid. Contact your dietitian for more information. WHERE CAN I FIND MORE INFORMATION? National Heart, Lung, and Blood Institute: travelstabloid.com Document Released: 11/01/2011 Document Revised: 03/29/2014 Document Reviewed: 09/16/2013 [facility] Patient Information 2015 [place], [place]. This information is not intended to replace advice given to you by your health care provider. Make sure you discuss any questions you have with your health care provider.

## 2019-02-10 ENCOUNTER — Other Ambulatory Visit: Payer: Self-pay | Admitting: Physician Assistant

## 2019-02-16 ENCOUNTER — Other Ambulatory Visit: Payer: Self-pay | Admitting: *Deleted

## 2019-02-16 DIAGNOSIS — C880 Waldenstrom macroglobulinemia: Secondary | ICD-10-CM

## 2019-02-16 DIAGNOSIS — G9331 Postviral fatigue syndrome: Secondary | ICD-10-CM

## 2019-02-16 DIAGNOSIS — G933 Postviral fatigue syndrome: Secondary | ICD-10-CM

## 2019-02-16 MED ORDER — FAMCICLOVIR 500 MG PO TABS: 500.0000 mg | ORAL_TABLET | Freq: Every day | ORAL | 3 refills | Status: DC

## 2019-02-20 ENCOUNTER — Other Ambulatory Visit: Payer: Self-pay | Admitting: Internal Medicine

## 2019-02-20 DIAGNOSIS — F331 Major depressive disorder, recurrent, moderate: Secondary | ICD-10-CM

## 2019-02-25 MED FILL — IMBRUVICA 420 MG TAB: 420 | 28 days supply | Qty: 28 | Fill #1

## 2019-03-02 ENCOUNTER — Telehealth: Payer: 59

## 2019-03-02 DIAGNOSIS — J3089 Other allergic rhinitis: Secondary | ICD-10-CM

## 2019-03-02 DIAGNOSIS — J019 Acute sinusitis, unspecified: Secondary | ICD-10-CM

## 2019-03-02 MED ORDER — AZITHROMYCIN 250 MG PO TABS: ORAL_TABLET | ORAL | 1 refills | Status: AC

## 2019-03-02 MED ORDER — PREDNISONE 20 MG PO TABS: ORAL_TABLET | ORAL | 0 refills | Status: DC

## 2019-03-02 NOTE — Telephone Encounter (Signed)
Virtual Visit via Telephone Note  I connected with Edwin Webster on @TODAY @ at  by telephone and verified that I am speaking with the correct person using two identifiers.   I discussed the limitations, risks, security and privacy concerns of performing an evaluation and management service by telephone and the availability of in person appointments. I also discussed with the patient that there may be a patient responsible charge related to this service. The patient expressed understanding and agreed to proceed.   History of Present Illness:  58 y.o. with hx of allergic rhinitis, Waldenstrom's macroglobulinemia, recent shingles calls to report generalized sinus pressure, sinus headache, nasal congestion x 3 weeks that seems worse in last 3 days. He reports he typically gets an annual sinusitis about this time of the year that resolves with zpak and steroid taper. He takes xyzal 5 mg year round, has been using flonase and sudafed as well but symptoms are persistent. He has been checking temp daily without fever/chills, denies sinus pain, ear pressure/pain, sore throat, cough, dyspnea, wheezing, dizziness, vision changes. He is concerned due to his depressed immune system.   Problem list, meds, allergies reviewed.     Observations/Objective:  General Appearance:Well sounding, in no apparent distress.  ENT/Mouth: No hoarseness, No cough for duration of visit.  Respiratory: completing full sentences without distress, without audible wheeze Neuro: Awake and oriented X 3,  Psych:  Insight and Judgment appropriate.    Assessment and Plan:  [name] was seen today for telephone assessment.  Diagnoses and all orders for this visit:  Acute non-recurrent sinusitis, unspecified location Proceed with abx due to duration and high risk patient Suggested symptomatic OTC remedies. Nasal saline spray for congestion. Nasal steroids, rotate allergy pill, oral steroids offered Follow up as needed. -      predniSONE (DELTASONE) 20 MG tablet; 2 tablets daily for 3 days, 1 tablet daily for 4 days. -     azithromycin (ZITHROMAX) 250 MG tablet; Take 2 tablets (500 mg) on  Day 1,  followed by 1 tablet (250 mg) once daily on Days 2 through 5.  Non-seasonal allergic rhinitis, unspecified trigger Rotate allergy pill, continue flonase, suggested saline nasal irrigations, increase H20, allergy hygiene explained. -     predniSONE (DELTASONE) 20 MG tablet; 2 tablets daily for 3 days, 1 tablet daily for 4 days.   Follow Up Instructions:    I discussed the assessment and treatment plan with the patient. The patient was provided an opportunity to ask questions and all were answered. The patient agreed with the plan and demonstrated an understanding of the instructions.   The patient was advised to call back or seek an in-person evaluation if the symptoms worsen or if the condition fails to improve as anticipated.  I provided 15 minutes of non-face-to-face time during this encounter.   [name], NP

## 2019-03-02 NOTE — Telephone Encounter (Signed)
Patient has nasal drainage, sinus pressure and is sneezing. Symptoms began four days ago. No shortness of breath, no chest pain, does not have an inhaler at home. States that he thinks he has a sinus infection, usually gets one this time of year. Currently taking sudafed. Antibiotics/prednisone has been successful in the past. Please advise.

## 2019-03-02 NOTE — Addendum Note (Signed)
Addended by: [name] on: 03/02/2019 03:37 PM   Modules accepted: Orders

## 2019-03-17 IMAGING — CT NM PET TUM IMG INITIAL (PI) SKULL BASE T - THIGH
7 of 8 series · 18 of 25 positions shown · non-contrast
Comparison: 03/12/2018 CT chest, abdomen and pelvis.

CLINICAL DATA: Initial treatment strategy for Waldenstrom's
macroglobulinemia/lymphoplasmacytic lymphoma.

EXAM:
NUCLEAR MEDICINE PET SKULL BASE TO THIGH
TECHNIQUE: 8.5 mCi F-18 FDG was injected intravenously. Full-ring PET imaging
was performed from the skull base to thigh after the radiotracer. CT
data was obtained and used for attenuation correction and anatomic
localization.
Fasting blood glucose: 90 mg/dl

[Series 3: pet sk_thigh ac · axial · 5.0mm · 4.07mm/px · z∈[-988,-64]mm · 4 of 232 slices shown]
[im 1/232]
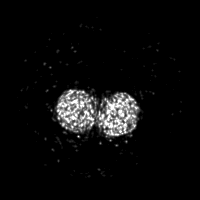
[im 116/232]
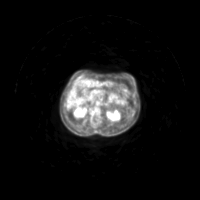
[im 174/232]
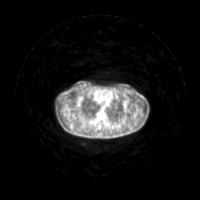
[im 232/232]
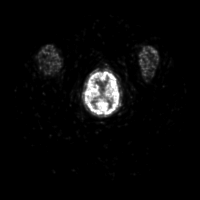

[Series 4: ct sk_thigh 5.0 b31f · axial · 5.0mm · 0.98mm/px · z∈[-760,-64]mm · 3 of 232 slices shown]
[im 58/232]
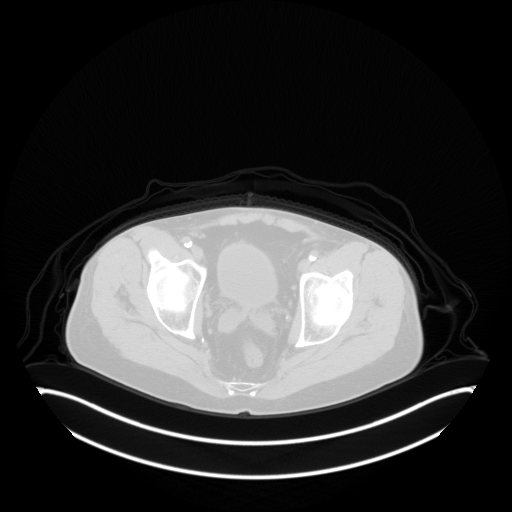
[im 116/232]
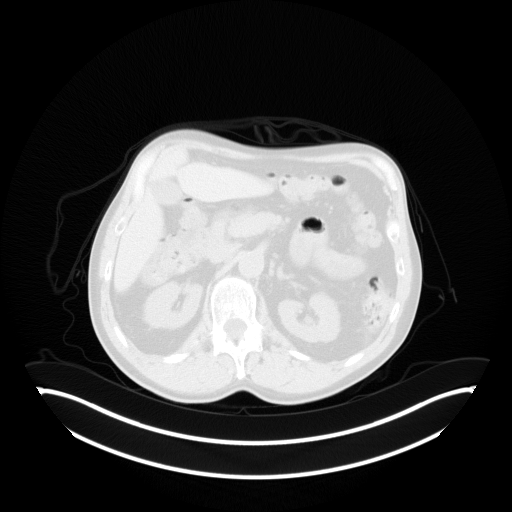
[im 232/232  brain]
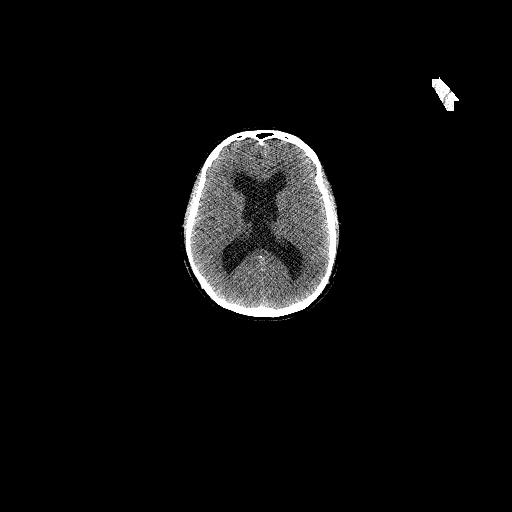

[Series 5: pet sk_thigh nac · axial · 5.0mm · 4.07mm/px · z∈[-988,-64]mm · 4 of 232 slices shown]
[im 1/232]
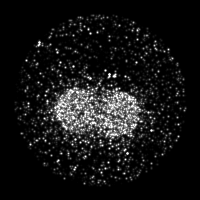
[im 58/232]
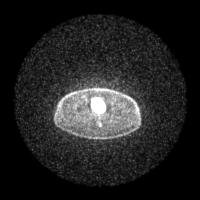
[im 174/232]
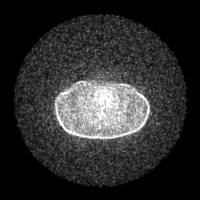
[im 232/232]
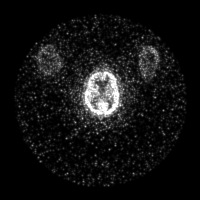

[Series 8: ct sk_thigh 5.0 b70f lung_bone · axial · 5.0mm · 0.64mm/px · 1 of 65 slices shown]
[im 1/65  bone]
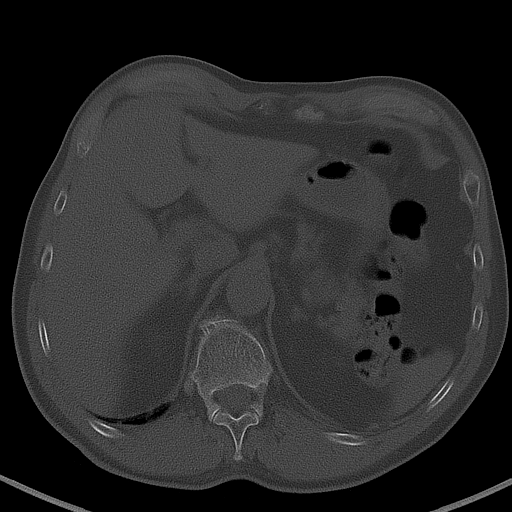

[Series 604: range-ct sk_thigh 5.0 (id)<alpha range> · 2 of 81 slices shown (1 of 2)]
[im 1/81]
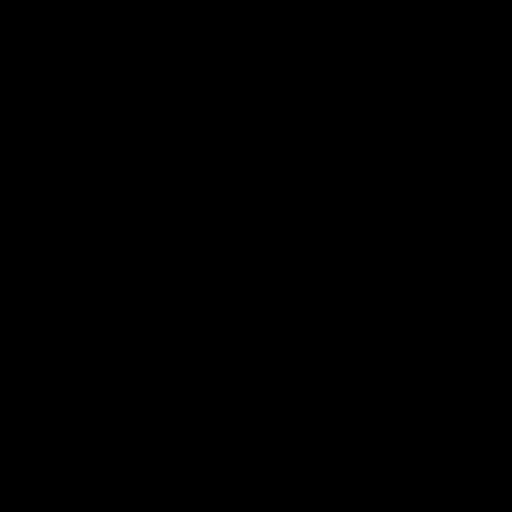
[im 81/81]
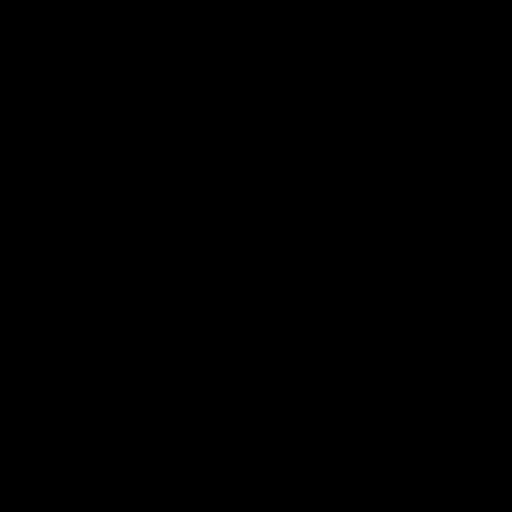

[Series 605: range-ct sk_thigh 5.0 (id)<alpha range> · 3 of 220 slices shown (2 of 2)]
[im 55/220]
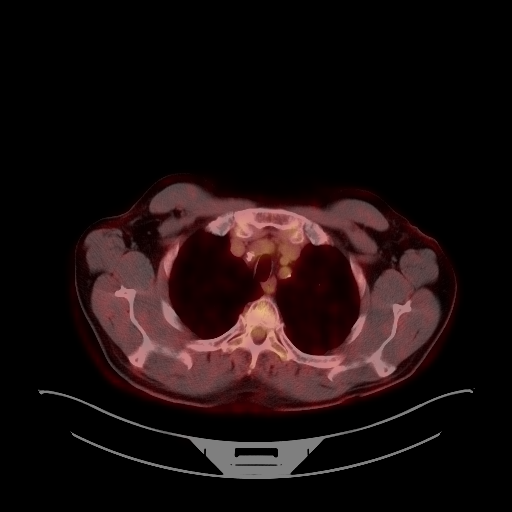
[im 110/220]
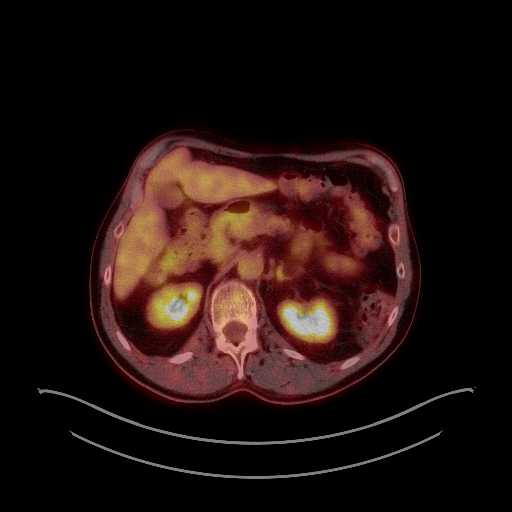
[im 165/220]
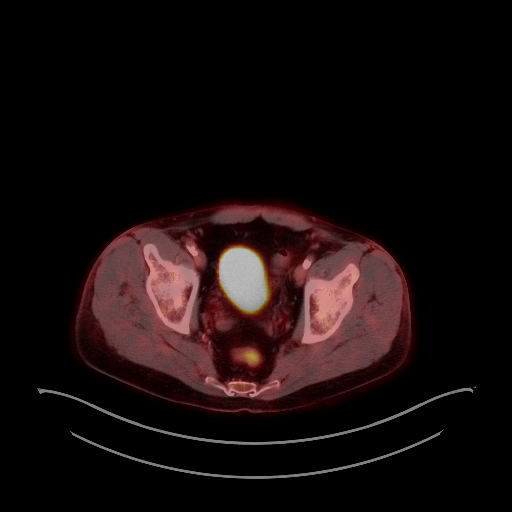

[Series 1076: results mm oncology reading · 1.0mm · 0.45mm/px · 1 of 2 slices shown]
[im 1/2]
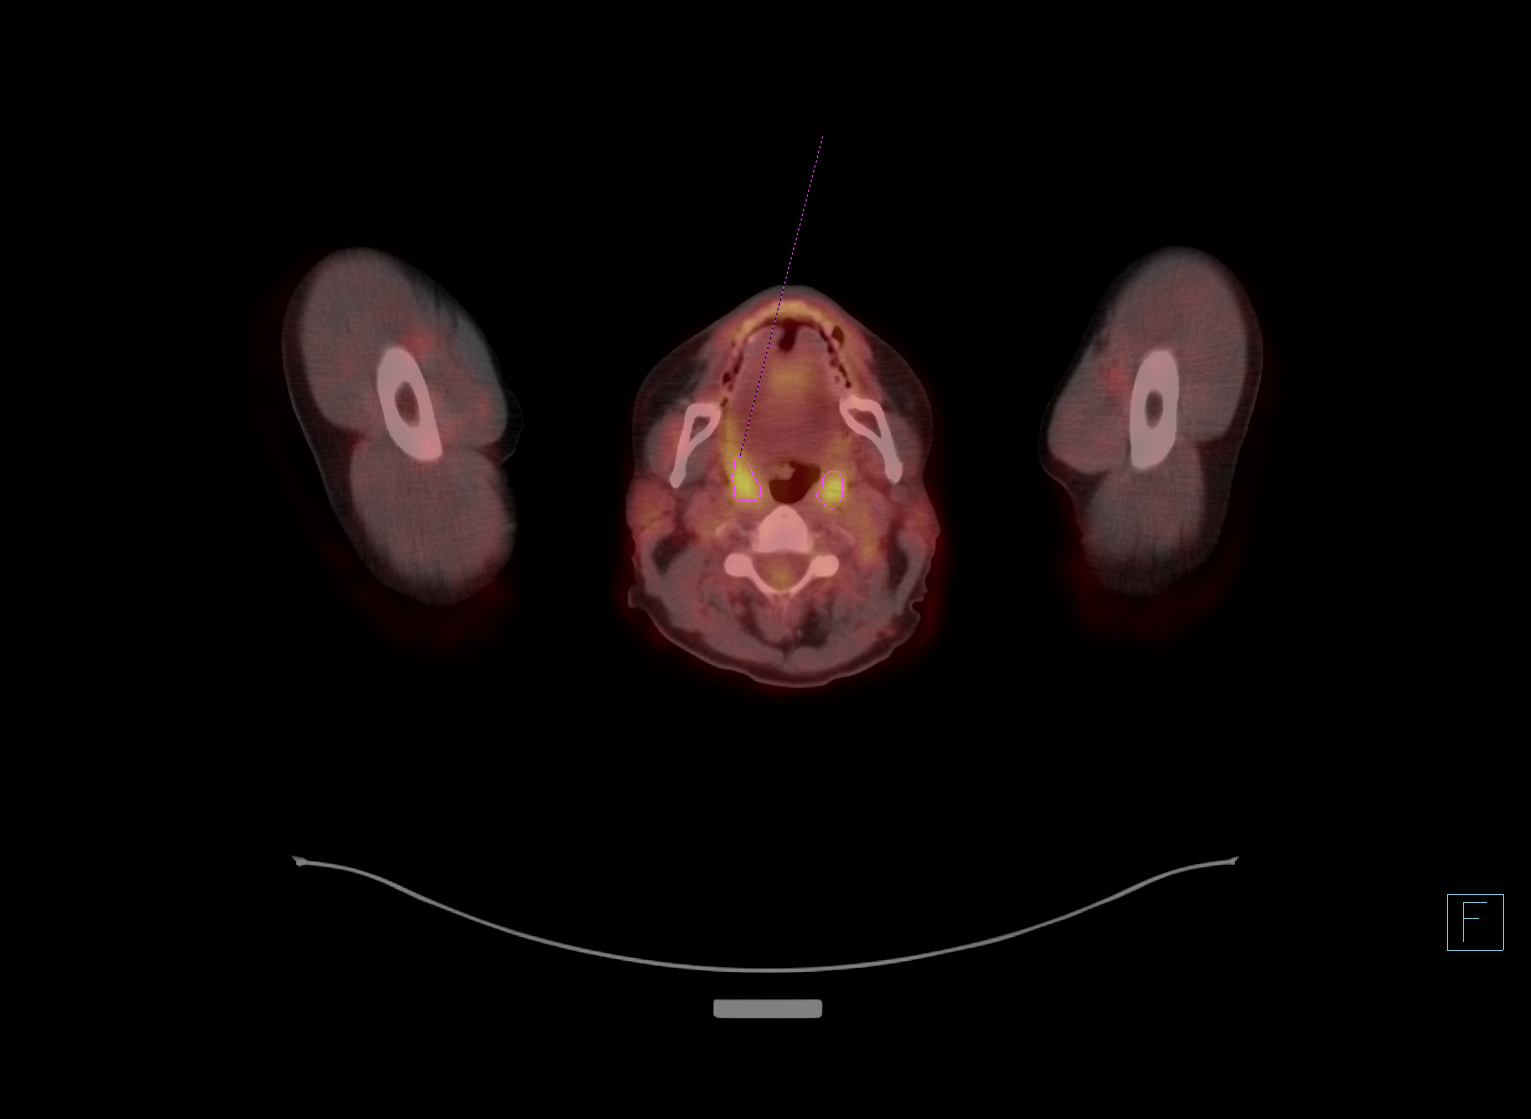

[18 of 25 positions shown; findings below may reference images not displayed]

FINDINGS: Mediastinal blood pool activity: SUV max

NECK: No hypermetabolic lymph nodes in the neck. There is symmetric
hypermetabolism in the palatine tonsils (max SUV 7.4) with
associated mild soft tissue prominence in the palatine tonsils on
the CT images.

Incidental CT findings: Symmetric prominence of the visualized
lateral ventricles, not appreciably changed since 08/28/2017 brain
MRI study.

CHEST: No enlarged or hypermetabolic axillary, mediastinal or hilar
lymph nodes.

No hypermetabolic lung masses or pulmonary nodules. There is low
level hypermetabolism associated with parenchymal bands in the
dependent lower lobes bilaterally, probably inflammatory.

Linear intramuscular uptake within a right back muscle without CT
correlate, probably activity related.

Incidental CT findings: Coronary atherosclerosis. Atherosclerotic
nonaneurysmal thoracic aorta. Moderate centrilobular and paraseptal
emphysema with diffuse bronchial wall thickening. No acute
consolidative airspace disease, lung masses or significant pulmonary
nodules.

ABDOMEN/PELVIS: No abnormal hypermetabolic activity within the
liver, pancreas, adrenal glands, or spleen. No hypermetabolic lymph
nodes in the abdomen or pelvis.

Incidental CT findings: Punctate nonobstructing upper right renal
stone. Atherosclerotic abdominal aorta with 2.6 cm infrarenal
ectatic abdominal aorta.

SKELETON: No focal hypermetabolic activity to suggest skeletal
metastasis.

Incidental CT findings: none
IMPRESSION: 1. Nonspecific symmetric hypermetabolism in the palatine tonsils
with associated mild soft tissue fullness in the palatine tonsils on
the CT images. This finding could be reactive or due to involvement
by lymphoproliferative condition.
2. No hypermetabolic lymphadenopathy. No additional potential
findings of hypermetabolic lymphoproliferative disease.
3. Symmetric prominence of the visualized lateral ventricles, not
appreciably changed since 08/28/2017 brain MRI study. Please see the
report from the 08/28/2017 brain MRI study for further commentary on
this finding.
4. Ectatic 2.6 cm infrarenal abdominal aorta. Ectatic abdominal
aorta at risk for aneurysm development. Recommend followup by
ultrasound in 5 years. This recommendation follows ACR consensus
guidelines: White Paper of the ACR Incidental Findings Committee II
on Vascular Findings. [HOSPITAL] 6705; [DATE].
5. Chronic findings include: Aortic Atherosclerosis (H3N26-HWL.L)
and Emphysema (H3N26-0T0.2). Coronary atherosclerosis. Punctate
nonobstructing upper right renal stone.

## 2019-03-21 ENCOUNTER — Other Ambulatory Visit: Payer: Self-pay | Admitting: Internal Medicine

## 2019-03-21 DIAGNOSIS — I10 Essential (primary) hypertension: Secondary | ICD-10-CM

## 2019-03-22 ENCOUNTER — Other Ambulatory Visit: Payer: Self-pay | Admitting: Adult Health

## 2019-03-24 ENCOUNTER — Other Ambulatory Visit: Payer: 59

## 2019-03-24 ENCOUNTER — Ambulatory Visit: Payer: 59 | Admitting: Hematology & Oncology

## 2019-03-30 MED FILL — IMBRUVICA 420 MG TAB: 420 | 28 days supply | Qty: 28 | Fill #2

## 2019-04-07 ENCOUNTER — Other Ambulatory Visit: Payer: Self-pay | Admitting: Adult Health

## 2019-04-09 ENCOUNTER — Other Ambulatory Visit: Payer: Self-pay | Admitting: Physician Assistant

## 2019-04-12 ENCOUNTER — Other Ambulatory Visit: Payer: Self-pay | Admitting: Internal Medicine

## 2019-04-12 DIAGNOSIS — F419 Anxiety disorder, unspecified: Secondary | ICD-10-CM

## 2019-04-22 NOTE — Patient Instructions (Signed)
- Vit D  And Vit C 1,000 mg are recommended to help  protect against the Covid_19 and other Corona viruses.   - Also it's recommended to take Zinc 50 mg to help protect against the Covid_19   And best place to get  is also on [employer] and don't pay more than 6-8 cents /pill !   ===================================== Coronavirus (COVID-19) Are you at risk?  Are you at risk for the Coronavirus (COVID-19)?  To be considered HIGH RISK for Coronavirus (COVID-19), you have to meet the following criteria:  . Traveled to [place], [place], [place], [place] or [place]; or in the [place] to [place], [place], [place]  . or [place]; and have fever, cough, and shortness of breath within the last 2 weeks of travel OR . Been in close contact with a person diagnosed with COVID-19 within the last 2 weeks and have  . fever, cough,and shortness of breath .  . IF YOU DO NOT MEET THESE CRITERIA, YOU ARE CONSIDERED LOW RISK FOR COVID-19.  What to do if you are HIGH RISK for COVID-19?  . If you are having a medical emergency, call 911. . Seek medical care right away. Before you go to a doctor's office, urgent care or emergency department, .  call ahead and tell them about your recent travel, contact with someone diagnosed with COVID-19  .  and your symptoms.  . You should receive instructions from your physician's office regarding next steps of care.  . When you arrive at healthcare provider, tell the healthcare staff immediately you have returned from  . visiting [place], [place], [place], [place] or [place]; or traveled in the [place] to [place], [place],  . [place] or [place] in the last two weeks or you have been in close contact with a person diagnosed with  . COVID-19 in the last 2 weeks.   . Tell the health care staff about your symptoms: fever, cough and shortness of breath. . After you have been seen by a medical provider, you will be either: o Tested for (COVID-19) and  discharged home on quarantine except to seek medical care if  o symptoms worsen, and asked to  - Stay home and avoid contact with others until you get your results (4-5 days)  - Avoid travel on public transportation if possible (such as bus, train, or airplane) or o Sent to the Emergency Department by EMS for evaluation, COVID-19 testing  and  o possible admission depending on your condition and test results.  What to do if you are LOW RISK for COVID-19?  Reduce your risk of any infection by using the same precautions used for avoiding the common cold or flu:  . Wash your hands often with soap and warm water for at least 20 seconds.  If soap and water are not readily available,  . use an alcohol-based hand sanitizer with at least 60% alcohol.  . If coughing or sneezing, cover your mouth and nose by coughing or sneezing into the elbow areas of your shirt or coat, .  into a tissue or into your sleeve (not your hands). . Avoid shaking hands with others and consider head nods or verbal greetings only. . Avoid touching your eyes, nose, or mouth with unwashed hands.  . Avoid close contact with people who are sick. . Avoid places or events with large numbers of people in one location, like concerts or sporting events. . Carefully consider travel plans you   have or are making. . If you are planning any travel outside or inside the [place], visit the CDC's Travelers' Health webpage for the latest health notices. . If you have some symptoms but not all symptoms, continue to monitor at home and seek medical attention  . if your symptoms worsen. . If you are having a medical emergency, call 911.   ++++++++++++++++++++++++++++++++ Recommend Adult Low Dose Aspirin or  coated  Aspirin 81 mg daily  To reduce risk of Colon Cancer 40 %,  Skin Cancer 26 % ,  Melanoma 46%  and  Pancreatic cancer 60% ++++++++++++++++++++++++++++++++ Vitamin D goal  is between 70-100.  Please make sure that you are taking  your Vitamin D as directed.  It is very important as a natural anti-inflammatory  helping hair, skin, and nails, as well as reducing stroke and heart attack risk.  It helps your bones and helps with mood. It also decreases numerous cancer risks so please take it as directed.  Low Vit D is associated with a 200-300% higher risk for CANCER  and 200-300% higher risk for HEART   ATTACK  &  STROKE.   ...................................... It is also associated with higher death rate at younger ages,  autoimmune diseases like Rheumatoid arthritis, Lupus, Multiple Sclerosis.    Also many other serious conditions, like depression, Alzheimer's Dementia, infertility, muscle aches, fatigue, fibromyalgia - just to name a few. ++++++++++++++++++++ Recommend the book "The END of DIETING" by Dr Joel Fuhrman  & the book "The END of DIABETES " by Dr Joel Fuhrman At [employer] - get book & Audio CD's    Being diabetic has a  300% increased risk for heart attack, stroke, cancer, and alzheimer- type vascular dementia. It is very important that you work harder with diet by avoiding all foods that are white. Avoid white rice (Miller & wild rice is OK), white potatoes (sweetpotatoes in moderation is OK), White bread or wheat bread or anything made out of white flour like bagels, donuts, rolls, buns, biscuits, cakes, pastries, cookies, pizza crust, and pasta (made from white flour & egg whites) - vegetarian pasta or spinach or wheat pasta is OK. Multigrain breads like Arnold's or Pepperidge Farm, or multigrain sandwich thins or flatbreads.  Diet, exercise and weight loss can reverse and cure diabetes in the early stages.  Diet, exercise and weight loss is very important in the control and prevention of complications of diabetes which affects every system in your body, ie. Brain - dementia/stroke, eyes - glaucoma/blindness, heart - heart attack/heart failure, kidneys - dialysis, stomach - gastric paralysis, intestines -  malabsorption, nerves - severe painful neuritis, circulation - gangrene & loss of a leg(s), and finally cancer and Alzheimers.    I recommend avoid fried & greasy foods,  sweets/candy, white rice (Sago or wild rice or Quinoa is OK), white potatoes (sweet potatoes are OK) - anything made from white flour - bagels, doughnuts, rolls, buns, biscuits,white and wheat breads, pizza crust and traditional pasta made of white flour & egg white(vegetarian pasta or spinach or wheat pasta is OK).  Multi-grain bread is OK - like multi-grain flat bread or sandwich thins. Avoid alcohol in excess. Exercise is also important.    Eat all the vegetables you want - avoid meat, especially red meat and dairy - especially cheese.  Cheese is the most concentrated form of trans-fats which is the worst thing to clog up our arteries. Veggie cheese is OK which can be found in the fresh produce   section at [employer] or [employer] or [employer]  +++++++++++++++++++++ DASH Eating Plan  DASH stands for "Dietary Approaches to Stop Hypertension."   The DASH eating plan is a healthy eating plan that has been shown to reduce high blood pressure (hypertension). Additional health benefits may include reducing the risk of type 2 diabetes mellitus, heart disease, and stroke. The DASH eating plan may also help with weight loss. WHAT DO I NEED TO KNOW ABOUT THE DASH EATING PLAN? For the DASH eating plan, you will follow these general guidelines:  Choose foods with a percent daily value for sodium of less than 5% (as listed on the food label).  Use salt-free seasonings or herbs instead of table salt or sea salt.  Check with your health care provider or pharmacist before using salt substitutes.  Eat lower-sodium products, often labeled as "lower sodium" or "no salt added."  Eat fresh foods.  Eat more vegetables, fruits, and low-fat dairy products.  Choose whole grains. Look for the word "whole" as the first word in the ingredient  list.  Choose fish   Limit sweets, desserts, sugars, and sugary drinks.  Choose heart-healthy fats.  Eat veggie cheese   Eat more home-cooked food and less restaurant, buffet, and fast food.  Limit fried foods.  Cook foods using methods other than frying.  Limit canned vegetables. If you do use them, rinse them well to decrease the sodium.  When eating at a restaurant, ask that your food be prepared with less salt, or no salt if possible.                      WHAT FOODS CAN I EAT? Read Dr Joel Fuhrman's books on The End of Dieting & The End of Diabetes  Grains Whole grain or whole wheat bread. Venturini rice. Whole grain or whole wheat pasta. Quinoa, bulgur, and whole grain cereals. Low-sodium cereals. Corn or whole wheat flour tortillas. Whole grain cornbread. Whole grain crackers. Low-sodium crackers.  Vegetables Fresh or frozen vegetables (raw, steamed, roasted, or grilled). Low-sodium or reduced-sodium tomato and vegetable juices. Low-sodium or reduced-sodium tomato sauce and paste. Low-sodium or reduced-sodium canned vegetables.   Fruits All fresh, canned (in natural juice), or frozen fruits.  Protein Products  All fish and seafood.  Dried beans, peas, or lentils. Unsalted nuts and seeds. Unsalted canned beans.  Dairy Low-fat dairy products, such as skim or 1% milk, 2% or reduced-fat cheeses, low-fat ricotta or cottage cheese, or plain low-fat yogurt. Low-sodium or reduced-sodium cheeses.  Fats and Oils Tub margarines without trans fats. Light or reduced-fat mayonnaise and salad dressings (reduced sodium). Avocado. Safflower, olive, or canola oils. Natural peanut or almond butter.  Other Unsalted popcorn and pretzels. The items listed above may not be a complete list of recommended foods or beverages. Contact your dietitian for more options.  +++++++++++++++  WHAT FOODS ARE NOT RECOMMENDED? Grains/ White flour or wheat flour White bread. White pasta. White rice.  Refined cornbread. Bagels and croissants. Crackers that contain trans fat.  Vegetables  Creamed or fried vegetables. Vegetables in a . Regular canned vegetables. Regular canned tomato sauce and paste. Regular tomato and vegetable juices.  Fruits Dried fruits. Canned fruit in light or heavy syrup. Fruit juice.  Meat and Other Protein Products Meat in general - RED meat & White meat.  Fatty cuts of meat. Ribs, chicken wings, all processed meats as bacon, sausage, bologna, salami, fatback, hot dogs, bratwurst and packaged luncheon meats.  Dairy Whole   or 2% milk, cream, half-and-half, and cream cheese. Whole-fat or sweetened yogurt. Full-fat cheeses or blue cheese. Non-dairy creamers and whipped toppings. Processed cheese, cheese spreads, or cheese curds.  Condiments Onion and garlic salt, seasoned salt, table salt, and sea salt. Canned and packaged gravies. Worcestershire sauce. Tartar sauce. Barbecue sauce. Teriyaki sauce. Soy sauce, including reduced sodium. Steak sauce. Fish sauce. Oyster sauce. Cocktail sauce. Horseradish. Ketchup and mustard. Meat flavorings and tenderizers. Bouillon cubes. Hot sauce. Tabasco sauce. Marinades. Taco seasonings. Relishes.  Fats and Oils Butter, stick margarine, lard, shortening and bacon fat. Coconut, palm kernel, or palm oils. Regular salad dressings.  Pickles and olives. Salted popcorn and pretzels.  The items listed above may not be a complete list of foods and beverages to avoid.   

## 2019-04-22 NOTE — Progress Notes (Signed)
History of Present Illness:      [name] presents for 3 month follow up with HTN, HLD, Pre-Diabetes and Vitamin D Deficiency.  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 Patient has GERD controlled on his Meds.       Patient is treated for HTN (2013) & BP has been controlled at home. Today's BP was 146/84 by the nurse & rechecked at Rt Wrist and Rt arm at 190/90. Patient has had no complaints of any cardiac type chest pain, palpitations, dyspnea / orthopnea / PND, dizziness, claudication, or dependent edema.      Hyperlipidemia is controlled with diet & meds. Patient denies myalgias or other med SE's. Current  Lipids are not at goal Lab Results  Component Value Date   CHOL 212 (H) 01/20/2019   HDL 42 01/20/2019   LDLCALC 151 (H) 01/20/2019   TRIG 83 01/20/2019   CHOLHDL 5.0 (H) 01/20/2019   Lab Results  Component Value Date   CHOL 251 (H) 04/23/2019   CHOL 212 (H) 01/20/2019   CHOL 190 09/24/2018   Lab Results  Component Value Date   HDL 55 04/23/2019   HDL 42 01/20/2019   HDL 58 09/24/2018   Lab Results  Component Value Date   LDLCALC 179 (H) 04/23/2019   LDLCALC 151 (H) 01/20/2019   LDLCALC 116 (H) 09/24/2018       Also, the patient has history of  PreDiabetes (A1c 5.7% / May 2017)  and has had no symptoms of reactive hypoglycemia, diabetic polys, paresthesias or visual blurring.  Last A1c was at goal: Lab Results  Component Value Date   HGBA1C 5.3 09/24/2018      Further, the patient also has history of Vitamin D Deficiency ("38" / 2014)  and supplements vitamin D without any suspected side-effects. Last vitamin D was still low (goal 70-100): Lab Results  Component Value Date   VD25OH 47 01/20/2019   Current Outpatient Medications on File Prior to Visit  Medication Sig  . ALPRAZolam (XANAX) 1 MG tablet Take 1/2 to 1 tablet 2 to 3 x /day ONLY if needed for  Anxiety Attack(s)  . aspirin EC 81 MG tablet Take 81 mg daily by mouth.  Marland Kitchen buPROPion (WELLBUTRIN XL) 300 MG 24 hr tablet TAKE 1 TABLET EVERY MORNING FOR MOOD  . busPIRone (BUSPAR) 5 MG tablet Take 1/2 to 1 tablet 3 x /day for Anxiety  . Cholecalciferol (VITAMIN D-3) 5000 UNITS TABS Take 10,000 Units by mouth daily.   Marland Kitchen escitalopram (LEXAPRO) 20 MG tablet TAKE 1 TABLET BY MOUTH EVERY DAY  . famotidine (PEPCID) 40 MG tablet TAKE 1/2-1 TAB WITH IBUPROFEN AS NEEDED.  . fluticasone (FLONASE) 50 MCG/ACT nasal spray Instill 2 sprays each Nares 2 x /day  . folic acid (FOLVITE) 1 MG tablet Take 1 mg by mouth daily.  Marland Kitchen ibuprofen (ADVIL,MOTRIN) 800 MG tablet Take 1 tablet (800 mg total) by mouth daily as needed.  . IMBRUVICA 420 MG TABS TAKE 1 TABLET BY MOUTH DAILY  . levocetirizine (XYZAL) 5 MG tablet TAKE 1 TABLET BY MOUTH EVERY DAY IN THE EVENING  . losartan (COZAAR) 100 MG tablet Take 1 tablet at Bedtime for BP  . rosuvastatin (CRESTOR) 40 MG tablet Take 1 tablet (40 mg total) by mouth daily. Take in the evening for cholesterol.  . triamcinolone ointment (KENALOG) 0.1 % Apply 1 application topically  2 (two) times daily.  . vitamin B-12 (CYANOCOBALAMIN) 500 MCG tablet Take 500 mcg by mouth daily.  . [DISCONTINUED] prochlorperazine (COMPAZINE) 10 MG tablet Take 1 tablet (10 mg total) by mouth every 6 (six) hours as needed (Nausea or vomiting).   No current facility-administered medications on file prior to visit.    Allergies  Allergen Reactions  . Adalimumab Other (See Comments)    "blacked out", confused "blacked out"   PMHx:   Past Medical History:  Diagnosis Date  . Allergy   . Anemia   . Anxiety   . Arthritis   . Chest pain, non-cardiac   . COPD (chronic obstructive pulmonary disease) ([place])   . Counseling regarding goals of care 03/27/2018  . Depression   . Diverticulitis   . Diverticulosis   . Fractured pelvis ([place])    fall from ladder  . GERD (gastroesophageal reflux disease)    . Gout   . History of shingles   . Hyperlipidemia   . Internal hemorrhoids   . Iron deficiency anemia 03/13/2018  . Kidney stones   . Other abnormal glucose 10/11/2015  . Rheumatoid arthritis ([place])    hands  . Waldenstrom's macroglobulinemia ([place]) 03/27/2018   Immunization History  Administered Date(s) Administered  . DTaP 06/04/2011  . Hepatitis B 11/26/2010  . Hepatitis B, ped/adol 08/06/2011  . Influenza Inj Mdck Quad With Preservative 09/13/2017, 08/21/2018  . Influenza Split 09/02/2014  . Influenza Whole 09/17/2013  . Influenza, Seasonal, Injecte, Preservative Fre 10/11/2015  . Influenza-Unspecified 09/13/2017  . PPD Test 10/11/2015   Past Surgical History:  Procedure Laterality Date  . COLONOSCOPY    . FINGER SURGERY    . iron infusion    . KNEE SURGERY     right  . ROTATOR CUFF REPAIR  1996   left   FHx:    Reviewed / unchanged  SHx:    Reviewed / unchanged   Systems Review:  Constitutional: Denies fever, chills, wt changes, headaches, insomnia, fatigue, night sweats, change in appetite. Eyes: Denies redness, blurred vision, diplopia, discharge, itchy, watery eyes.  ENT: Denies discharge, congestion, post nasal drip, epistaxis, sore throat, earache, hearing loss, dental pain, tinnitus, vertigo, sinus pain, snoring.  CV: Denies chest pain, palpitations, irregular heartbeat, syncope, dyspnea, diaphoresis, orthopnea, PND, claudication or edema. Respiratory: denies cough, dyspnea, DOE, pleurisy, hoarseness, laryngitis, wheezing.  Gastrointestinal: Denies dysphagia, odynophagia, heartburn, reflux, water brash, abdominal pain or cramps, nausea, vomiting, bloating, diarrhea, constipation, hematemesis, melena, hematochezia  or hemorrhoids. Genitourinary: Denies dysuria, frequency, urgency, nocturia, hesitancy, discharge, hematuria or flank pain. Musculoskeletal: Denies arthralgias, myalgias, stiffness, jt. swelling, pain, limping or strain/sprain.  Skin: Denies pruritus,  rash, hives, warts, acne, eczema or change in skin lesion(s). Neuro: No weakness, tremor, incoordination, spasms, paresthesia or pain. Psychiatric: Denies confusion, memory loss or sensory loss. Endo: Denies change in weight, skin or hair change.  Heme/Lymph: No excessive bleeding, bruising or enlarged lymph nodes.  Physical Exam  BP (!) 146/84   Pulse 64   Temp (!) 97.2 F (36.2 C)   Resp 16   Ht 6' (1.829 m)   Wt 174 lb 12.8 oz (79.3 kg)   BMI 23.71 kg/m   Appears  well nourished, well groomed  and in no distress.  Eyes: PERRLA, EOMs, conjunctiva no swelling or erythema. Sinuses: No frontal/maxillary tenderness ENT/Mouth: EAC's clear, TM's nl w/o erythema, bulging. Nares clear w/o erythema, swelling, exudates. Oropharynx clear without erythema or exudates. Oral hygiene is good. Tongue normal, non obstructing. Hearing  intact.  Neck: Supple. Thyroid not palpable. Car 2+/2+ without bruits, nodes or JVD. Chest: Respirations nl with BS clear & equal w/o rales, rhonchi, wheezing or stridor.  Cor: Heart sounds normal w/ regular rate and rhythm without sig. murmurs, gallops, clicks or rubs. Peripheral pulses normal and equal  without edema.  Abdomen: Soft & bowel sounds normal. Non-tender w/o guarding, rebound, hernias, masses or organomegaly.  Lymphatics: Unremarkable.  Musculoskeletal: Full ROM all peripheral extremities, joint stability, 5/5 strength and normal gait.  Skin: Warm, dry without exposed rashes, lesions or ecchymosis apparent.  Neuro: Cranial nerves intact, reflexes equal bilaterally. Sensory-motor testing grossly intact. Tendon reflexes grossly intact.  Pysch: Alert & oriented x 3.  Insight and judgement nl & appropriate. No ideations.  Assessment and Plan:  1. Essential hypertension  - Continue medication, monitor blood pressure at home.  - Continue DASH diet.  Reminder to go to the ER if any CP,  SOB, nausea, dizziness, severe HA, changes vision/speech.  - CBC  with Differential/Platelet - COMPLETE METABOLIC PANEL WITH GFR - Magnesium - TSH  2. Hyperlipidemia, mixed  - Continue diet/meds, exercise,& lifestyle modifications.  - Continue monitor periodic cholesterol/liver & renal functions   - Lipid panel - TSH  3. Abnormal glucose  - Continue diet, exercise  - Lifestyle modifications.  - Monitor appropriate labs.  - Hemoglobin A1c - Insulin, random  4. Vitamin D deficiency  - Continue supplementation.  - VITAMIN D 25 Hydroxyl  5. Medication management  - CBC with Differential/Platelet - COMPLETE METABOLIC PANEL WITH GFR - Magnesium - Lipid panel - TSH - Hemoglobin A1c - Insulin, random - VITAMIN D 25 Hydroxyl      Discussed  regular exercise, BP monitoring, weight control to achieve/maintain BMI less than 25 and discussed med and SE's. Recommended labs to assess and monitor clinical status with further disposition pending results of labs. I discussed the assessment and treatment plan with the patient. The patient was provided an opportunity to ask questions and all were answered. The patient agreed with the plan and demonstrated an understanding of the instructions. I provided over 27 minutes of exam, counseling, chart review and  complex critical decision making was performed  Kirtland Bouchard, MD

## 2019-04-23 ENCOUNTER — Other Ambulatory Visit: Payer: Self-pay

## 2019-04-23 ENCOUNTER — Ambulatory Visit (INDEPENDENT_AMBULATORY_CARE_PROVIDER_SITE_OTHER): Payer: 59 | Admitting: Internal Medicine

## 2019-04-23 VITALS — BP 146/84 | HR 64 | Temp 97.2°F | Resp 16 | Ht 72.0 in | Wt 174.8 lb

## 2019-04-23 DIAGNOSIS — E559 Vitamin D deficiency, unspecified: Secondary | ICD-10-CM

## 2019-04-23 DIAGNOSIS — E782 Mixed hyperlipidemia: Secondary | ICD-10-CM | POA: Diagnosis not present

## 2019-04-23 DIAGNOSIS — I10 Essential (primary) hypertension: Secondary | ICD-10-CM | POA: Diagnosis not present

## 2019-04-23 DIAGNOSIS — Z79899 Other long term (current) drug therapy: Secondary | ICD-10-CM | POA: Diagnosis not present

## 2019-04-23 DIAGNOSIS — R7309 Other abnormal glucose: Secondary | ICD-10-CM

## 2019-04-23 MED ORDER — ATENOLOL 100 MG PO TABS: ORAL_TABLET | ORAL | 3 refills | Status: DC

## 2019-04-24 ENCOUNTER — Encounter: Payer: Self-pay | Admitting: Internal Medicine

## 2019-04-24 LAB — HEMOGLOBIN A1C
Hgb A1c MFr Bld: 5.2 % of total Hgb (ref <5.7)
Mean Plasma Glucose: 103 (calc)
eAG (mmol/L): 5.7 (calc)

## 2019-04-24 LAB — COMPLETE METABOLIC PANEL WITH GFR
AG Ratio: 2 (calc) (ref 1.0–2.5)
ALT: 10 U/L (ref 9–46)
AST: 17 U/L (ref 10–35)
Albumin: 4.5 g/dL (ref 3.6–5.1)
Alkaline phosphatase (APISO): 77 U/L (ref 35–144)
BUN: 14 mg/dL (ref 7–25)
CO2: 23 mmol/L (ref 20–32)
Calcium: 10 mg/dL (ref 8.6–10.3)
Chloride: 105 mmol/L (ref 98–110)
Creat: 1.25 mg/dL (ref 0.70–1.25)
GFR, Est African American: 72 mL/min/{1.73_m2} (ref >=60)
GFR, Est Non African American: 62 mL/min/{1.73_m2} (ref >=60)
Globulin: 2.3 g/dL (calc) (ref 1.9–3.7)
Glucose, Bld: 75 mg/dL (ref 65–99)
Potassium: 4.5 mmol/L (ref 3.5–5.3)
Sodium: 142 mmol/L (ref 135–146)
Total Bilirubin: 0.6 mg/dL (ref 0.2–1.2)
Total Protein: 6.8 g/dL (ref 6.1–8.1)

## 2019-04-24 LAB — LIPID PANEL
Cholesterol: 251 mg/dL — ABNORMAL HIGH (ref <200)
HDL: 55 mg/dL (ref >=40)
LDL Cholesterol (Calc): 179 mg/dL (calc) — ABNORMAL HIGH
Non-HDL Cholesterol (Calc): 196 mg/dL (calc) — ABNORMAL HIGH (ref <130)
Total CHOL/HDL Ratio: 4.6 (calc) (ref <5.0)
Triglycerides: 65 mg/dL (ref <150)

## 2019-04-24 LAB — TSH: TSH: 1.85 mIU/L (ref 0.40–4.50)

## 2019-04-24 LAB — CBC WITH DIFFERENTIAL/PLATELET
Absolute Monocytes: 472 cells/uL (ref 200–950)
Basophils Absolute: 52 cells/uL (ref 0–200)
Basophils Relative: 1.3 %
Eosinophils Absolute: 52 cells/uL (ref 15–500)
Eosinophils Relative: 1.3 %
HCT: 43.2 % (ref 38.5–50.0)
Hemoglobin: 14.2 g/dL (ref 13.2–17.1)
Lymphs Abs: 872 cells/uL (ref 850–3900)
MCH: 32 pg (ref 27.0–33.0)
MCHC: 32.9 g/dL (ref 32.0–36.0)
MCV: 97.3 fL (ref 80.0–100.0)
MPV: 11.4 fL (ref 7.5–12.5)
Monocytes Relative: 11.8 %
Neutro Abs: 2552 cells/uL (ref 1500–7800)
Neutrophils Relative %: 63.8 %
Platelets: 230 10*3/uL (ref 140–400)
RBC: 4.44 10*6/uL (ref 4.20–5.80)
RDW: 13.9 % (ref 11.0–15.0)
Total Lymphocyte: 21.8 %
WBC: 4 10*3/uL (ref 3.8–10.8)

## 2019-04-24 LAB — VITAMIN D 25 HYDROXY (VIT D DEFICIENCY, FRACTURES): Vit D, 25-Hydroxy: 66 ng/mL (ref 30–100)

## 2019-04-24 LAB — MAGNESIUM: Magnesium: 2.1 mg/dL (ref 1.5–2.5)

## 2019-04-24 LAB — INSULIN, RANDOM: Insulin: <1 u[IU]/mL

## 2019-04-27 ENCOUNTER — Inpatient Hospital Stay: Payer: 59 | Attending: Hematology & Oncology

## 2019-04-27 ENCOUNTER — Encounter: Payer: Self-pay | Admitting: Hematology & Oncology

## 2019-04-27 ENCOUNTER — Inpatient Hospital Stay (HOSPITAL_BASED_OUTPATIENT_CLINIC_OR_DEPARTMENT_OTHER): Payer: 59 | Admitting: Hematology & Oncology

## 2019-04-27 ENCOUNTER — Other Ambulatory Visit: Payer: Self-pay

## 2019-04-27 DIAGNOSIS — C880 Waldenstrom macroglobulinemia: Secondary | ICD-10-CM

## 2019-04-27 DIAGNOSIS — D509 Iron deficiency anemia, unspecified: Secondary | ICD-10-CM | POA: Diagnosis not present

## 2019-04-27 LAB — CBC WITH DIFFERENTIAL (CANCER CENTER ONLY)
Abs Immature Granulocytes: 0.03 10*3/uL (ref 0.00–0.07)
Basophils Absolute: 0.1 10*3/uL (ref 0.0–0.1)
Basophils Relative: 1 %
Eosinophils Absolute: 0.1 10*3/uL (ref 0.0–0.5)
Eosinophils Relative: 2 %
HCT: 38.9 % — ABNORMAL LOW (ref 39.0–52.0)
Hemoglobin: 12.9 g/dL — ABNORMAL LOW (ref 13.0–17.0)
Immature Granulocytes: 1 %
Lymphocytes Relative: 19 %
Lymphs Abs: 0.8 10*3/uL (ref 0.7–4.0)
MCH: 32.8 pg (ref 26.0–34.0)
MCHC: 33.2 g/dL (ref 30.0–36.0)
MCV: 99 fL (ref 80.0–100.0)
Monocytes Absolute: 0.7 10*3/uL (ref 0.1–1.0)
Monocytes Relative: 15 %
Neutro Abs: 2.7 10*3/uL (ref 1.7–7.7)
Neutrophils Relative %: 62 %
Platelet Count: 220 10*3/uL (ref 150–400)
RBC: 3.93 MIL/uL — ABNORMAL LOW (ref 4.22–5.81)
RDW: 13.5 % (ref 11.5–15.5)
WBC Count: 4.4 10*3/uL (ref 4.0–10.5)
nRBC: 0 % (ref 0.0–0.2)

## 2019-04-27 LAB — CMP (CANCER CENTER ONLY)
ALT: 10 U/L (ref 0–44)
AST: 15 U/L (ref 15–41)
Albumin: 4.4 g/dL (ref 3.5–5.0)
Alkaline Phosphatase: 68 U/L (ref 38–126)
Anion gap: 7 (ref 5–15)
BUN: 14 mg/dL (ref 6–20)
CO2: 30 mmol/L (ref 22–32)
Calcium: 8.8 mg/dL — ABNORMAL LOW (ref 8.9–10.3)
Chloride: 104 mmol/L (ref 98–111)
Creatinine: 1.27 mg/dL — ABNORMAL HIGH (ref 0.61–1.24)
GFR, Est AFR Am: >60 mL/min (ref >60)
GFR, Estimated: >60 mL/min (ref >60)
Glucose, Bld: 74 mg/dL (ref 70–99)
Potassium: 4.1 mmol/L (ref 3.5–5.1)
Sodium: 141 mmol/L (ref 135–145)
Total Bilirubin: 0.6 mg/dL (ref 0.3–1.2)
Total Protein: 6.3 g/dL — ABNORMAL LOW (ref 6.5–8.1)

## 2019-04-27 LAB — LACTATE DEHYDROGENASE: LDH: 158 U/L (ref 98–192)

## 2019-04-27 NOTE — Progress Notes (Signed)
Hematology and Oncology Follow Up Visit  Edwin Webster [id number] 27-Jun-1961 58 y.o. 04/27/2019   Principle Diagnosis:  Waldenstrom's macroglobulinemia Iron deficiency anemia  Current Therapy:   Rituxan/bendamustine-s/p cycle 2 -- d/c on 06/11/2018 Imbruvica 420 mg po q day -- started on 06/14/2018 IV iron as indicated - last received in April 2019   Interim History:  Edwin Webster is here today for follow-up.  Everything seems to be doing pretty well with him.  We have not seen him since January.  Edwin Webster had to cancel his appointment because of the coronavirus.  We saw him in January, his M spike was 0.4 g/dL.  His IgM level was 596 mg/dL.  Edwin Webster has had no problems with headache.  There is no chest wall pain.  Edwin Webster has had no change in bowel or bladder habits.  There is been no rashes.  Edwin Webster has had no neuropathy.  His iron studies we last checked back last year showed a ferritin of 566 with an iron saturation of 40%.  Overall, his performance status is ECOG 1.    Medications:  Allergies as of 04/27/2019      Reactions   Adalimumab Other (See Comments)   "blacked out", confused "blacked out"      Medication List       Accurate as of April 27, 2019  9:57 AM. If you have any questions, ask your nurse or doctor.        ALPRAZolam 1 MG tablet Commonly known as:  Xanax Take 1/2 to 1 tablet 2 to 3 x /day ONLY if needed for Anxiety Attack(s)   aspirin EC 81 MG tablet Take 81 mg daily by mouth.   atenolol 100 MG tablet Commonly known as:  Tenormin Take 1 tablet every morning for BP   bisoprolol 10 MG tablet Commonly known as:  ZEBETA Take 10 mg by mouth daily.   buPROPion 300 MG 24 hr tablet Commonly known as:  WELLBUTRIN XL TAKE 1 TABLET EVERY MORNING FOR MOOD   busPIRone 5 MG tablet Commonly known as:  BUSPAR Take 1/2 to 1 tablet 3 x /day for Anxiety   escitalopram 20 MG tablet Commonly known as:  LEXAPRO TAKE 1 TABLET BY MOUTH EVERY DAY   famotidine 40 MG tablet Commonly  known as:  PEPCID TAKE 1/2-1 TAB WITH IBUPROFEN AS NEEDED.   fluticasone 50 MCG/ACT nasal spray Commonly known as:  FLONASE Instill 2 sprays each Nares 2 x /day   folic acid 1 MG tablet Commonly known as:  FOLVITE Take 1 mg by mouth daily.   ibuprofen 800 MG tablet Commonly known as:  Motrin IB Take 1 tablet (800 mg total) by mouth daily as needed.   Imbruvica 420 MG Tabs Generic drug:  Ibrutinib TAKE 1 TABLET BY MOUTH DAILY   levocetirizine 5 MG tablet Commonly known as:  XYZAL TAKE 1 TABLET BY MOUTH EVERY DAY IN THE EVENING   losartan 100 MG tablet Commonly known as:  COZAAR Take 1 tablet at Bedtime for BP   rosuvastatin 40 MG tablet Commonly known as:  CRESTOR Take 1 tablet (40 mg total) by mouth daily. Take in the evening for cholesterol.   triamcinolone ointment 0.1 % Commonly known as:  KENALOG Apply 1 application topically 2 (two) times daily.   vitamin B-12 500 MCG tablet Commonly known as:  CYANOCOBALAMIN Take 500 mcg by mouth daily.   Vitamin D-3 125 MCG (5000 UT) Tabs Take 10,000 Units by mouth daily.  Allergies:  Allergies  Allergen Reactions  . Adalimumab Other (See Comments)    "blacked out", confused "blacked out"    Past Medical History, Surgical history, Social history, and Family History were reviewed and updated.  Review of Systems: Review of Systems  Constitutional: Negative.   HENT: Negative.   Eyes: Negative.   Respiratory: Negative.   Cardiovascular: Negative.   Gastrointestinal: Negative.   Genitourinary: Negative.   Musculoskeletal: Negative.   Skin: Negative.   Neurological: Negative.   Endo/Heme/Allergies: Negative.   Psychiatric/Behavioral: Negative.       Physical Exam:  vitals were not taken for this visit.   Wt Readings from Last 3 Encounters:  04/23/19 174 lb 12.8 oz (79.3 kg)  02/02/19 177 lb (80.3 kg)  01/27/19 180 lb (81.6 kg)    Physical Exam Vitals signs reviewed.  HENT:     Head:  Normocephalic and atraumatic.  Eyes:     Pupils: Pupils are equal, round, and reactive to light.  Neck:     Musculoskeletal: Normal range of motion.  Cardiovascular:     Rate and Rhythm: Normal rate and regular rhythm.     Heart sounds: Normal heart sounds.  Pulmonary:     Effort: Pulmonary effort is normal.     Breath sounds: Normal breath sounds.  Abdominal:     General: Bowel sounds are normal.     Palpations: Abdomen is soft.  Musculoskeletal: Normal range of motion.        General: No tenderness or deformity.  Lymphadenopathy:     Cervical: No cervical adenopathy.  Skin:    General: Skin is warm and dry.     Findings: No erythema or rash.  Neurological:     Mental Status: Edwin Webster is alert and oriented to person, place, and time.  Psychiatric:        Behavior: Behavior normal.        Thought Content: Thought content normal.        Judgment: Judgment normal.      Lab Results  Component Value Date   WBC 4.4 04/27/2019   HGB 12.9 (L) 04/27/2019   HCT 38.9 (L) 04/27/2019   MCV 99.0 04/27/2019   PLT 220 04/27/2019   Lab Results  Component Value Date   FERRITIN 556 (H) 07/09/2018   IRON 109 07/09/2018   TIBC 274 07/09/2018   UIBC 164 07/09/2018   IRONPCTSAT 40 (L) 07/09/2018   Lab Results  Component Value Date   RETICCTPCT 0.9 02/12/2018   RBC 3.93 (L) 04/27/2019   RETICCTABS 33,120 02/12/2018   Lab Results  Component Value Date   KPAFRELGTCHN 27.1 (H) 12/22/2018   LAMBDASER 11.3 12/22/2018   KAPLAMBRATIO 2.40 (H) 12/22/2018   Lab Results  Component Value Date   IGGSERUM 504 (L) 12/22/2018   IGA 38 (L) 12/22/2018   IGMSERUM 630 (H) 12/22/2018   Lab Results  Component Value Date   TOTALPROTELP 6.7 12/22/2018   ALBUMINELP 4.1 12/22/2018   A1GS 0.3 12/22/2018   A2GS 0.8 12/22/2018   BETS 0.7 12/22/2018   BETA2SER 0.2 02/18/2018   GAMS 0.8 12/22/2018   MSPIKE 0.4 (H) 12/22/2018   SPEI Comment 07/09/2018     Chemistry      Component Value Date/Time    NA 141 04/27/2019 0855   K 4.1 04/27/2019 0855   CL 104 04/27/2019 0855   CO2 30 04/27/2019 0855   BUN 14 04/27/2019 0855   CREATININE 1.27 (H) 04/27/2019 0855   CREATININE 1.25 04/23/2019  1052      Component Value Date/Time   CALCIUM 8.8 (L) 04/27/2019 0855   ALKPHOS 68 04/27/2019 0855   AST 15 04/27/2019 0855   ALT 10 04/27/2019 0855   BILITOT 0.6 04/27/2019 0855      Impression and Plan: Edwin Webster is a very pleasant 58 yo caucasian gentleman with Waldenstrom's macroglobulinemia.   It will be very interesting to see what his Waldenstrm's studies look like.  His total protein is down so hopefully this is a sign that the IgM level is also improved.  I will try to get through the summer.  I will see him back in September depending on his [facility] studies.  [name], MD 6/1/20209:57 AM

## 2019-04-28 ENCOUNTER — Encounter: Payer: Self-pay | Admitting: *Deleted

## 2019-04-28 LAB — IGG, IGA, IGM
IgA: 26 mg/dL — ABNORMAL LOW (ref 90–386)
IgG (Immunoglobin G), Serum: 336 mg/dL — ABNORMAL LOW (ref 603–1613)
IgM (Immunoglobulin M), Srm: 417 mg/dL — ABNORMAL HIGH (ref 20–172)

## 2019-04-28 LAB — KAPPA/LAMBDA LIGHT CHAINS
Kappa free light chain: 26.2 mg/L — ABNORMAL HIGH (ref 3.3–19.4)
Kappa, lambda light chain ratio: 2.26 — ABNORMAL HIGH (ref 0.26–1.65)
Lambda free light chains: 11.6 mg/L (ref 5.7–26.3)

## 2019-04-30 ENCOUNTER — Telehealth: Payer: Self-pay | Admitting: *Deleted

## 2019-04-30 LAB — PROTEIN ELECTROPHORESIS, SERUM, WITH REFLEX
A/G Ratio: 1.3 (ref 0.7–1.7)
Albumin ELP: 3.4 g/dL (ref 2.9–4.4)
Alpha-1-Globulin: 0.3 g/dL (ref 0.0–0.4)
Alpha-2-Globulin: 0.7 g/dL (ref 0.4–1.0)
Beta Globulin: 0.9 g/dL (ref 0.7–1.3)
Gamma Globulin: 0.7 g/dL (ref 0.4–1.8)
Globulin, Total: 2.6 g/dL (ref 2.2–3.9)
M-Spike, %: 0.3 g/dL — ABNORMAL HIGH
SPEP Interpretation: 0
Total Protein ELP: 6 g/dL (ref 6.0–8.5)

## 2019-04-30 LAB — IMMUNOFIXATION REFLEX, SERUM
IgA: 27 mg/dL — ABNORMAL LOW (ref 90–386)
IgG (Immunoglobin G), Serum: 364 mg/dL — ABNORMAL LOW (ref 603–1613)
IgM (Immunoglobulin M), Srm: 447 mg/dL — ABNORMAL HIGH (ref 20–172)

## 2019-04-30 MED FILL — IMBRUVICA 420 MG TAB: 420 | 28 days supply | Qty: 28 | Fill #3

## 2019-04-30 NOTE — Telephone Encounter (Addendum)
Patient is aware of results  ----- Message from [name], MD sent at 04/30/2019  2:49 PM EDT ----- Please call and tell him that the [facility] level is now down to 0.3.  The IgM is now down to 417.  The Kate Sable is working quite nicely.  I would not change the dose.  Thank you.

## 2019-05-02 ENCOUNTER — Other Ambulatory Visit: Payer: Self-pay | Admitting: Adult Health

## 2019-05-07 ENCOUNTER — Encounter: Payer: Self-pay | Admitting: Internal Medicine

## 2019-05-07 ENCOUNTER — Ambulatory Visit: Payer: 59 | Admitting: Internal Medicine

## 2019-05-07 ENCOUNTER — Other Ambulatory Visit: Payer: Self-pay

## 2019-05-07 VITALS — BP 132/84 | HR 60 | Temp 97.0°F | Resp 16 | Ht 72.0 in | Wt 173.6 lb

## 2019-05-07 DIAGNOSIS — I10 Essential (primary) hypertension: Secondary | ICD-10-CM

## 2019-05-07 NOTE — Progress Notes (Signed)
Subjective:    Patient ID: Edwin Webster, male    DOB: 11/14/1961, 58 y.o.   MRN: [id number]  HPI    [name]  with HTN, HLD, Pre-Diabetes, Vitamin D Deficiency  And recent Dx/o Waldenstrom's macroglobulinemia  returns for 2 week f/u of elevated BP 190/90 with Atenolol 100 mg added with his Losartan 100mg .     Medication Sig  . ALPRAZolam (XANAX) 1 MG tablet Take 1/2 to 1 tablet 2 to 3 x /day ONLY if needed for Anxiety Attack(s)  . aspirin EC 81 MG tablet Take 81 mg daily by mouth.  Marland Kitchen atenolol (TENORMIN) 100 MG tablet Take 1 tablet every morning for BP  . buPROPion (WELLBUTRIN XL) 300 MG 24 hr tablet TAKE 1 TABLET EVERY MORNING FOR MOOD  . busPIRone (BUSPAR) 5 MG tablet Take 1/2 to 1 tablet 3 x /day for Anxiety  . Cholecalciferol (VITAMIN D-3) 5000 UNITS TABS Take 10,000 Units by mouth daily.   Marland Kitchen escitalopram (LEXAPRO) 20 MG tablet TAKE 1 TABLET BY MOUTH EVERY DAY  . famotidine (PEPCID) 40 MG tablet Take 1 tabet Daily for Heartburn and Indigestion  . fluticasone (FLONASE) 50 MCG/ACT nasal spray Instill 2 sprays each Nares 2 x /day  . folic acid (FOLVITE) 1 MG tablet Take 1 mg by mouth daily.  Marland Kitchen ibuprofen (ADVIL,MOTRIN) 800 MG tablet Take 1 tablet (800 mg total) by mouth daily as needed.  . IMBRUVICA 420 MG TABS TAKE 1 TABLET BY MOUTH DAILY  . levocetirizine (XYZAL) 5 MG tablet TAKE 1 TABLET BY MOUTH EVERY DAY IN THE EVENING  . losartan (COZAAR) 100 MG tablet Take 1 tablet at Bedtime for BP  . rosuvastatin (CRESTOR) 40 MG tablet Take 1 tablet (40 mg total) by mouth daily. Take in the evening for cholesterol.  . triamcinolone ointment (KENALOG) 0.1 % Apply 1 application topically 2 (two) times daily.  . vitamin B-12 (CYANOCOBALAMIN) 500 MCG tablet Take 500 mcg by mouth daily.  . bisoprolol (ZEBETA) 10 MG tablet Take 10 mg by mouth daily.   Allergies  Allergen Reactions  . Adalimumab Other (See Comments)    "blacked out", confused "blacked out"   Past Medical History:   Diagnosis Date  . Allergy   . Anemia   . Anxiety   . Arthritis   . Chest pain, non-cardiac   . COPD (chronic obstructive pulmonary disease) ([place])   . Counseling regarding goals of care 03/27/2018  . Depression   . Diverticulitis   . Diverticulosis   . Fractured pelvis ([place])    fall from ladder  . GERD (gastroesophageal reflux disease)   . Gout   . History of shingles   . Hyperlipidemia   . Internal hemorrhoids   . Iron deficiency anemia 03/13/2018  . Kidney stones   . Other abnormal glucose 10/11/2015  . Rheumatoid arthritis ([place])    hands  . Waldenstrom's macroglobulinemia ([place]) 03/27/2018    Review of Systems   10 point systems review negative except as above.    Objective:   Physical Exam  BP 132/84   P 60   T 97 F    R 16   Ht 6'    Wt 173 lb 9.6 oz    BMI 23.54   HEENT - WNL. Neck - supple.  Chest - Clear equal BS. Cor - Nl HS. RRR w/o sig MGR. PP 1(+). No edema. MS- FROM w/o deformities.  Gait Nl. Neuro -  Nl w/o  focal abnormalities.    Assessment & Plan:   1. Essential hypertension  - advised continue meds same and continue BP monitoring

## 2019-05-09 ENCOUNTER — Encounter: Payer: Self-pay | Admitting: Internal Medicine

## 2019-06-01 MED FILL — IMBRUVICA 420 MG TAB: 420 | 28 days supply | Qty: 28 | Fill #4

## 2019-06-08 ENCOUNTER — Other Ambulatory Visit: Payer: Self-pay

## 2019-06-08 ENCOUNTER — Ambulatory Visit: Payer: 59 | Admitting: Adult Health

## 2019-06-08 ENCOUNTER — Encounter: Payer: Self-pay | Admitting: Adult Health

## 2019-06-08 VITALS — BP 140/76 | HR 53 | Temp 97.5°F | Ht 72.0 in | Wt 171.0 lb

## 2019-06-08 DIAGNOSIS — L03312 Cellulitis of back [any part except buttock]: Secondary | ICD-10-CM

## 2019-06-08 DIAGNOSIS — B029 Zoster without complications: Secondary | ICD-10-CM

## 2019-06-08 MED ORDER — DOXYCYCLINE HYCLATE 100 MG PO CAPS: ORAL_CAPSULE | ORAL | 0 refills | Status: DC

## 2019-06-08 MED ORDER — PREDNISONE 20 MG PO TABS: ORAL_TABLET | ORAL | 0 refills | Status: DC

## 2019-06-08 MED ORDER — GABAPENTIN 300 MG PO CAPS: 300.0000 mg | ORAL_CAPSULE | Freq: Three times a day (TID) | ORAL | 0 refills | Status: DC | PRN

## 2019-06-08 NOTE — Patient Instructions (Signed)
Please increase famcyclovir from once to 3 times daily for 1 week;   if no improvement with this and doxycycline, please call Dr. Marin Olp to discuss  Gabapentin as needed only for shingles nerve pain; may cause drowsiness   Shingles  Shingles, which is also known as herpes zoster, is an infection that causes a painful skin rash and fluid-filled blisters. It is caused by a virus. Shingles only develops in people who:  Have had chickenpox.  Have been given a medicine to protect against chickenpox (have been vaccinated). Shingles is rare in this group. What are the causes? Shingles is caused by varicella-zoster virus (VZV). This is the same virus that causes chickenpox. After a person is exposed to VZV, the virus stays in the body in an inactive (dormant) state. Shingles develops if the virus is reactivated. This can happen many years after the first (initial) exposure to VZV. It is not known what causes this virus to be reactivated. What increases the risk? People who have had chickenpox or received the chickenpox vaccine are at risk for shingles. Shingles infection is more common in people who:  Are older than age 41.  Have a weakened disease-fighting system (immune system), such as people with: ? HIV. ? AIDS. ? Cancer.  Are taking medicines that weaken the immune system, such as transplant medicines.  Are experiencing a lot of stress. What are the signs or symptoms? Early symptoms of this condition include itching, tingling, and pain in an area on your skin. Pain may be described as burning, stabbing, or throbbing. A few days or weeks after early symptoms start, a painful red rash appears. The rash is usually on one side of the body and has a band-like or belt-like pattern. The rash eventually turns into fluid-filled blisters that break open, change into scabs, and dry up in about 2-3 weeks. At any time during the infection, you may also develop:  A fever.  Chills.  A headache.   An upset stomach. How is this diagnosed? This condition is diagnosed with a skin exam. Skin or fluid samples may be taken from the blisters before a diagnosis is made. These samples are examined under a microscope or sent to a lab for testing. How is this treated? The rash may last for several weeks. There is not a specific cure for this condition. Your health care provider will probably prescribe medicines to help you manage pain, recover more quickly, and avoid long-term problems. Medicines may include:  Antiviral drugs.  Anti-inflammatory drugs.  Pain medicines.  Anti-itching medicines (antihistamines). If the area involved is on your face, you may be referred to a specialist, such as an eye doctor (ophthalmologist) or an ear, nose, and throat (ENT) doctor (otolaryngologist) to help you avoid eye problems, chronic pain, or disability. Follow these instructions at home: Medicines  Take over-the-counter and prescription medicines only as told by your health care provider.  Apply an anti-itch cream or numbing cream to the affected area as told by your health care provider. Relieving itching and discomfort   Apply cold, wet cloths (cold compresses) to the area of the rash or blisters as told by your health care provider.  Cool baths can be soothing. Try adding baking soda or dry oatmeal to the water to reduce itching. Do not bathe in hot water. Blister and rash care  Keep your rash covered with a loose bandage (dressing). Wear loose-fitting clothing to help ease the pain of material rubbing against the rash.  Keep your  rash and blisters clean by washing the area with mild soap and cool water as told by your health care provider.  Check your rash every day for signs of infection. Check for: ? More redness, swelling, or pain. ? Fluid or blood. ? Warmth. ? Pus or a bad smell.  Do not scratch your rash or pick at your blisters. To help avoid scratching: ? Keep your fingernails  clean and cut short. ? Wear gloves or mittens while you sleep, if scratching is a problem. General instructions  Rest as told by your health care provider.  Keep all follow-up visits as told by your health care provider. This is important.  Wash your hands often with soap and water. If soap and water are not available, use hand sanitizer. Doing this lowers your chance of getting a bacterial skin infection.  Before your blisters change into scabs, your shingles infection can cause chickenpox in people who have never had it or have never been vaccinated against it. To prevent this from happening, avoid contact with other people, especially: ? Babies. ? Pregnant women. ? Children who have eczema. ? Elderly people who have transplants. ? People who have chronic illnesses, such as cancer or AIDS. Contact a health care provider if:  Your pain is not relieved with prescribed medicines.  Your pain does not get better after the rash heals.  You have signs of infection in the rash area, such as: ? More redness, swelling, or pain around the rash. ? Fluid or blood coming from the rash. ? The rash area feeling warm to the touch. ? Pus or a bad smell coming from the rash. Get help right away if:  The rash is on your face or nose.  You have facial pain, pain around your eye area, or loss of feeling on one side of your face.  You have difficulty seeing.  You have ear pain or have ringing in your ear.  You have a loss of taste.  Your condition gets worse. Summary  Shingles, which is also known as herpes zoster, is an infection that causes a painful skin rash and fluid-filled blisters.  This condition is diagnosed with a skin exam. Skin or fluid samples may be taken from the blisters and examined before the diagnosis is made.  Keep your rash covered with a loose bandage (dressing). Wear loose-fitting clothing to help ease the pain of material rubbing against the rash.  Before your  blisters change into scabs, your shingles infection can cause chickenpox in people who have never had it or have never been vaccinated against it. This information is not intended to replace advice given to you by your health care provider. Make sure you discuss any questions you have with your health care provider. Document Released: 11/12/2005 Document Revised: 03/06/2019 Document Reviewed: 07/17/2017 Elsevier Patient Education  [place].    Doxycycline tablets or capsules What is this medicine? DOXYCYCLINE (dox i SYE kleen) is a tetracycline antibiotic. It kills certain bacteria or stops their growth. It is used to treat many kinds of infections, like dental, skin, respiratory, and urinary tract infections. It also treats acne, Lyme disease, malaria, and certain sexually transmitted infections. This medicine may be used for other purposes; ask your health care provider or pharmacist if you have questions. COMMON BRAND NAME(S): Acticlate, Adoxa, Adoxa CK, Adoxa Pak, Adoxa TT, Alodox, Avidoxy, Doxal, LYMEPAK, Mondoxyne NL, Monodox, Morgidox 1x, Morgidox 1x Kit, Morgidox 2x, Morgidox 2x Kit, NutriDox, Ocudox, TARGADOX, Vibra-Tabs, Vibramycin What  should I tell my health care provider before I take this medicine? They need to know if you have any of these conditions:  liver disease  long exposure to sunlight like working outdoors  stomach problems like colitis  an unusual or allergic reaction to doxycycline, tetracycline antibiotics, other medicines, foods, dyes, or preservatives  pregnant or trying to get pregnant  breast-feeding How should I use this medicine? Take this medicine by mouth with a full glass of water. Follow the directions on the prescription label. It is best to take this medicine without food, but if it upsets your stomach take it with food. Take your medicine at regular intervals. Do not take your medicine more often than directed. Take all of your medicine as  directed even if you think you are better. Do not skip doses or stop your medicine early. Talk to your pediatrician regarding the use of this medicine in children. While this drug may be prescribed for selected conditions, precautions do apply. Overdosage: If you think you have taken too much of this medicine contact a poison control center or emergency room at once. NOTE: This medicine is only for you. Do not share this medicine with others. What if I miss a dose? If you miss a dose, take it as soon as you can. If it is almost time for your next dose, take only that dose. Do not take double or extra doses. What may interact with this medicine?  antacids  barbiturates  birth control pills  bismuth subsalicylate  carbamazepine  methoxyflurane  other antibiotics  phenytoin  vitamins that contain iron  warfarin This list may not describe all possible interactions. Give your health care provider a list of all the medicines, herbs, non-prescription drugs, or dietary supplements you use. Also tell them if you smoke, drink alcohol, or use illegal drugs. Some items may interact with your medicine. What should I watch for while using this medicine? Tell your doctor or health care professional if your symptoms do not improve. Do not treat diarrhea with over the counter products. Contact your doctor if you have diarrhea that lasts more than 2 days or if it is severe and watery. Do not take this medicine just before going to bed. It may not dissolve properly when you lay down and can cause pain in your throat. Drink plenty of fluids while taking this medicine to also help reduce irritation in your throat. This medicine can make you more sensitive to the sun. Keep out of the sun. If you cannot avoid being in the sun, wear protective clothing and use sunscreen. Do not use sun lamps or tanning beds/booths. Birth control pills may not work properly while you are taking this medicine. Talk to your  doctor about using an extra method of birth control. If you are being treated for a sexually transmitted infection, avoid sexual contact until you have finished your treatment. Your sexual partner may also need treatment. Avoid antacids, aluminum, calcium, magnesium, and iron products for 4 hours before and 2 hours after taking a dose of this medicine. If you are using this medicine to prevent malaria, you should still protect yourself from contact with mosquitos. Stay in screened-in areas, use mosquito nets, keep your body covered, and use an insect repellent. What side effects may I notice from receiving this medicine? Side effects that you should report to your doctor or health care professional as soon as possible:  allergic reactions like skin rash, itching or hives, swelling of the  face, lips, or tongue  difficulty breathing  fever  itching in the rectal or genital area  pain on swallowing  rash, fever, and swollen lymph nodes  redness, blistering, peeling or loosening of the skin, including inside the mouth  severe stomach pain or cramps  unusual bleeding or bruising  unusually weak or tired  yellowing of the eyes or skin Side effects that usually do not require medical attention (report to your doctor or health care professional if they continue or are bothersome):  diarrhea  loss of appetite  nausea, vomiting This list may not describe all possible side effects. Call your doctor for medical advice about side effects. You may report side effects to FDA at 1-800-FDA-1088. Where should I keep my medicine? Keep out of the reach of children. Store at room temperature, below 30 degrees C (86 degrees F). Protect from light. Keep container tightly closed. Throw away any unused medicine after the expiration date. Taking this medicine after the expiration date can make you seriously ill. NOTE: This sheet is a summary. It may not cover all possible information. If you have  questions about this medicine, talk to your doctor, pharmacist, or health care provider.  2020 Elsevier/Gold Standard (2019-02-12 13:44:53)    Gabapentin capsules or tablets What is this medicine? GABAPENTIN (GA ba pen tin) is used to control seizures in certain types of epilepsy. It is also used to treat certain types of nerve pain. This medicine may be used for other purposes; ask your health care provider or pharmacist if you have questions. COMMON BRAND NAME(S): Active-PAC with Gabapentin, Gabarone, Neurontin What should I tell my health care provider before I take this medicine? They need to know if you have any of these conditions:  history of drug abuse or alcohol abuse problem  kidney disease  lung or breathing disease  suicidal thoughts, plans, or attempt; a previous suicide attempt by you or a family member  an unusual or allergic reaction to gabapentin, other medicines, foods, dyes, or preservatives  pregnant or trying to get pregnant  breast-feeding How should I use this medicine? Take this medicine by mouth with a glass of water. Follow the directions on the prescription label. You can take it with or without food. If it upsets your stomach, take it with food. Take your medicine at regular intervals. Do not take it more often than directed. Do not stop taking except on your doctor's advice. If you are directed to break the 600 or 800 mg tablets in half as part of your dose, the extra half tablet should be used for the next dose. If you have not used the extra half tablet within 28 days, it should be thrown away. A special MedGuide will be given to you by the pharmacist with each prescription and refill. Be sure to read this information carefully each time. Talk to your pediatrician regarding the use of this medicine in children. While this drug may be prescribed for children as young as 3 years for selected conditions, precautions do apply. Overdosage: If you think you  have taken too much of this medicine contact a poison control center or emergency room at once. NOTE: This medicine is only for you. Do not share this medicine with others. What if I miss a dose? If you miss a dose, take it as soon as you can. If it is almost time for your next dose, take only that dose. Do not take double or extra doses. What may interact with  this medicine? This medicine may interact with the following medications:  alcohol  antihistamines for allergy, cough, and cold  certain medicines for anxiety or sleep  certain medicines for depression like amitriptyline, fluoxetine, sertraline  certain medicines for seizures like phenobarbital, primidone  certain medicines for stomach problems  general anesthetics like halothane, isoflurane, methoxyflurane, propofol  local anesthetics like lidocaine, pramoxine, tetracaine  medicines that relax muscles for surgery  narcotic medicines for pain  phenothiazines like chlorpromazine, mesoridazine, prochlorperazine, thioridazine This list may not describe all possible interactions. Give your health care provider a list of all the medicines, herbs, non-prescription drugs, or dietary supplements you use. Also tell them if you smoke, drink alcohol, or use illegal drugs. Some items may interact with your medicine. What should I watch for while using this medicine? Visit your doctor or health care provider for regular checks on your progress. You may want to keep a record at home of how you feel your condition is responding to treatment. You may want to share this information with your doctor or health care provider at each visit. You should contact your doctor or health care provider if your seizures get worse or if you have any new types of seizures. Do not stop taking this medicine or any of your seizure medicines unless instructed by your doctor or health care provider. Stopping your medicine suddenly can increase your seizures or their  severity. This medicine may cause serious skin reactions. They can happen weeks to months after starting the medicine. Contact your health care provider right away if you notice fevers or flu-like symptoms with a rash. The rash may be red or purple and then turn into blisters or peeling of the skin. Or, you might notice a red rash with swelling of the face, lips or lymph nodes in your neck or under your arms. Wear a medical identification bracelet or chain if you are taking this medicine for seizures, and carry a card that lists all your medications. You may get drowsy, dizzy, or have blurred vision. Do not drive, use machinery, or do anything that needs mental alertness until you know how this medicine affects you. To reduce dizzy or fainting spells, do not sit or stand up quickly, especially if you are an older patient. Alcohol can increase drowsiness and dizziness. Avoid alcoholic drinks. Your mouth may get dry. Chewing sugarless gum or sucking hard candy, and drinking plenty of water will help. The use of this medicine may increase the chance of suicidal thoughts or actions. Pay special attention to how you are responding while on this medicine. Any worsening of mood, or thoughts of suicide or dying should be reported to your health care provider right away. Women who become pregnant while using this medicine may enroll in the [place] Pregnancy Registry by calling [id number]. This registry collects information about the safety of antiepileptic drug use during pregnancy. What side effects may I notice from receiving this medicine? Side effects that you should report to your doctor or health care professional as soon as possible:  allergic reactions like skin rash, itching or hives, swelling of the face, lips, or tongue  breathing problems  rash, fever, and swollen lymph nodes  redness, blistering, peeling or loosening of the skin, including inside the mouth   suicidal thoughts, mood changes Side effects that usually do not require medical attention (report to your doctor or health care professional if they continue or are bothersome):  dizziness  drowsiness  headache  nausea, vomiting  swelling of ankles, feet, hands  tiredness This list may not describe all possible side effects. Call your doctor for medical advice about side effects. You may report side effects to FDA at 1-800-FDA-1088. Where should I keep my medicine? Keep out of reach of children. This medicine may cause accidental overdose and death if it taken by other adults, children, or pets. Mix any unused medicine with a substance like cat litter or coffee grounds. Then throw the medicine away in a sealed container like a sealed bag or a coffee can with a lid. Do not use the medicine after the expiration date. Store at room temperature between 15 and 30 degrees C (59 and 86 degrees F). NOTE: This sheet is a summary. It may not cover all possible information. If you have questions about this medicine, talk to your doctor, pharmacist, or health care provider.  2020 Elsevier/Gold Standard (2019-02-13 14:16:43)

## 2019-06-08 NOTE — Progress Notes (Signed)
Assessment and Plan:  Edwin Webster was seen today for herpes zoster.  Diagnoses and all orders for this visit:  Herpes zoster without complication Increase famcyclovir from daily to TID x 7 days then resume daily as prophylaxis -     predniSONE (DELTASONE) 20 MG tablet; 2 tablets daily for 3 days, 1 tablet daily for 4 days. -     gabapentin (NEURONTIN) 300 MG capsule; Take 1 capsule (300 mg total) by mouth 3 (three) times daily as needed. For shingles pain  Cellulitis of skin of back Rare scattered Skin lesions with infectious appearance; unclear etiology; will proceed with abx, if no improvement, advised to inquire with Dr. Marin Olp whether could be related to Waldenstrom's  -     doxycycline (VIBRAMYCIN) 100 MG capsule; Take 1 capsule 2 x/day with food for 10 days.  Further disposition pending results of labs. Discussed med's effects and SE's.   Over 15 minutes of exam, counseling, chart review, and critical decision making was performed.   Future Appointments  Date Time Provider [place]  08/05/2019 11:00 AM Unk Pinto, MD [facility] None  08/06/2019  7:45 AM [facility] None  08/06/2019  8:00 AM Ennever, Rudell Cobb, MD [facility] None    ------------------------------------------------------------------------------------------------------------------   HPI 58 y.o.male with recent diagnosis hx of Waldenstrom's macroglobulinemia (followed by Dr. Marin Olp, on Rituxan/bendamustine), RA, depression/anxiety, hx of shingles presents for evaluation of rash to right hip x 1 week, with tenderness, several days of burning prodrome. He is reportedly on famciclovir 500 mg daily as prophylaxis due to Waldenstrom's treatment. He also reports new tender rash to upper back x 3 days.   He tried topical triamcinolone 0.1% which worsened symptoms. Tried calamine which also made it worse.   Denies any other new sx; denies fever/chills, URI or GI sx, new fatigue/malaise, dizziness, headaches.     Past Medical History:  Diagnosis Date  . Allergy   . Anemia   . Anxiety   . Arthritis   . Chest pain, non-cardiac   . COPD (chronic obstructive pulmonary disease) ([place])   . Counseling regarding goals of care 03/27/2018  . Depression   . Diverticulitis   . Diverticulosis   . Fractured pelvis ([place])    fall from ladder  . GERD (gastroesophageal reflux disease)   . Gout   . History of shingles   . Hyperlipidemia   . Internal hemorrhoids   . Iron deficiency anemia 03/13/2018  . Kidney stones   . Other abnormal glucose 10/11/2015  . Rheumatoid arthritis ([place])    hands  . Waldenstrom's macroglobulinemia ([place]) 03/27/2018     Allergies  Allergen Reactions  . Adalimumab Other (See Comments)    "blacked out", confused "blacked out"    Current Outpatient Medications on File Prior to Visit  Medication Sig  . ALPRAZolam (XANAX) 1 MG tablet Take 1/2 to 1 tablet 2 to 3 x /day ONLY if needed for Anxiety Attack(s)  . aspirin EC 81 MG tablet Take 81 mg daily by mouth.  Marland Kitchen atenolol (TENORMIN) 100 MG tablet Take 1 tablet every morning for BP  . bisoprolol (ZEBETA) 10 MG tablet Take 10 mg by mouth daily.  Marland Kitchen buPROPion (WELLBUTRIN XL) 300 MG 24 hr tablet TAKE 1 TABLET EVERY MORNING FOR MOOD  . busPIRone (BUSPAR) 5 MG tablet Take 1/2 to 1 tablet 3 x /day for Anxiety  . Cholecalciferol (VITAMIN D-3) 5000 UNITS TABS Take 10,000 Units by mouth daily.   Marland Kitchen escitalopram (LEXAPRO) 20 MG tablet TAKE  1 TABLET BY MOUTH EVERY DAY  . famciclovir (FAMVIR) 500 MG tablet Take 1 tablet by mouth daily.  . famotidine (PEPCID) 40 MG tablet Take 1 tabet Daily for Heartburn and Indigestion  . fluticasone (FLONASE) 50 MCG/ACT nasal spray Instill 2 sprays each Nares 2 x /day  . folic acid (FOLVITE) 1 MG tablet Take 1 mg by mouth daily.  Marland Kitchen ibuprofen (ADVIL,MOTRIN) 800 MG tablet Take 1 tablet (800 mg total) by mouth daily as needed.  . IMBRUVICA 420 MG TABS TAKE 1 TABLET BY MOUTH DAILY  . levocetirizine (XYZAL) 5 MG  tablet TAKE 1 TABLET BY MOUTH EVERY DAY IN THE EVENING  . losartan (COZAAR) 100 MG tablet Take 1 tablet at Bedtime for BP  . rosuvastatin (CRESTOR) 40 MG tablet Take 1 tablet (40 mg total) by mouth daily. Take in the evening for cholesterol.  . triamcinolone ointment (KENALOG) 0.1 % Apply 1 application topically 2 (two) times daily.  . vitamin B-12 (CYANOCOBALAMIN) 500 MCG tablet Take 500 mcg by mouth daily.  . [DISCONTINUED] prochlorperazine (COMPAZINE) 10 MG tablet Take 1 tablet (10 mg total) by mouth every 6 (six) hours as needed (Nausea or vomiting).   No current facility-administered medications on file prior to visit.     ROS: all negative except above.   Physical Exam:  BP 140/76   Pulse (!) 53   Temp (!) 97.5 F (36.4 C)   Ht 6' (1.829 m)   Wt 171 lb (77.6 kg)   SpO2 97%   BMI 23.19 kg/m   General Appearance: Well nourished, in no apparent distress. Eyes: conjunctiva no swelling or erythema ENT/Mouth: Ext aud canals clear, TMs without erythema, bulging. No erythema, swelling, or exudate on post pharynx.  Tonsils not swollen or erythematous. Hearing normal.  Neck: Supple, thyroid normal.  Respiratory: Respiratory effort normal, BS equal bilaterally without rales, rhonchi, wheezing or stridor.  Cardio: RRR with no MRGs. Brisk peripheral pulses without edema.  Lymphatics: Non tender without lymphadenopathy.  Musculoskeletal: normal gait.  Skin: He has clustered rash with erythematous base and small bulla over approx 5 cm x 8 cm area; no notable discharge; he has scattered lesions, #5 to upper back, each approx 0.5-1.0 area, mildly erythematous surrounding; single lesion to left upper back has scant purulent appearing discharge; otherwise without Neuro: Cranial nerves intact. Normal muscle tone, no cerebellar symptoms. Sensation intact.  Psych: Awake and oriented X 3, normal affect, Insight and Judgment appropriate.     [name], NP 1:23 PM [facility]

## 2019-06-17 ENCOUNTER — Other Ambulatory Visit: Payer: Self-pay | Admitting: Physician Assistant

## 2019-06-17 DIAGNOSIS — R0989 Other specified symptoms and signs involving the circulatory and respiratory systems: Secondary | ICD-10-CM

## 2019-06-29 ENCOUNTER — Telehealth: Payer: Self-pay

## 2019-06-29 NOTE — Telephone Encounter (Signed)
Please advise about what BP medications he should be taking.

## 2019-06-30 ENCOUNTER — Other Ambulatory Visit: Payer: Self-pay | Admitting: Adult Health

## 2019-06-30 ENCOUNTER — Other Ambulatory Visit: Payer: Self-pay

## 2019-06-30 DIAGNOSIS — B029 Zoster without complications: Secondary | ICD-10-CM

## 2019-06-30 MED ORDER — ATENOLOL 100 MG PO TABS: ORAL_TABLET | ORAL | 3 refills | Status: DC

## 2019-06-30 NOTE — Telephone Encounter (Signed)
Patient advised and medication list updated

## 2019-07-02 ENCOUNTER — Other Ambulatory Visit: Payer: Self-pay | Admitting: Physician Assistant

## 2019-07-06 MED FILL — IMBRUVICA 420 MG TAB: 420 | 28 days supply | Qty: 28 | Fill #5

## 2019-07-18 ENCOUNTER — Other Ambulatory Visit: Payer: Self-pay | Admitting: Adult Health

## 2019-08-05 ENCOUNTER — Ambulatory Visit: Payer: 59 | Admitting: Internal Medicine

## 2019-08-05 ENCOUNTER — Other Ambulatory Visit: Payer: Self-pay

## 2019-08-05 ENCOUNTER — Encounter: Payer: Self-pay | Admitting: Internal Medicine

## 2019-08-05 VITALS — BP 126/84 | HR 72 | Temp 97.0°F | Ht 69.5 in | Wt 173.4 lb

## 2019-08-05 DIAGNOSIS — Z125 Encounter for screening for malignant neoplasm of prostate: Secondary | ICD-10-CM

## 2019-08-05 DIAGNOSIS — I10 Essential (primary) hypertension: Secondary | ICD-10-CM | POA: Diagnosis not present

## 2019-08-05 DIAGNOSIS — Z0001 Encounter for general adult medical examination with abnormal findings: Secondary | ICD-10-CM

## 2019-08-05 DIAGNOSIS — R350 Frequency of micturition: Secondary | ICD-10-CM

## 2019-08-05 DIAGNOSIS — Z1389 Encounter for screening for other disorder: Secondary | ICD-10-CM

## 2019-08-05 DIAGNOSIS — Z0000 Encounter for general adult medical examination without abnormal findings: Secondary | ICD-10-CM

## 2019-08-05 DIAGNOSIS — Z13220 Encounter for screening for lipoid disorders: Secondary | ICD-10-CM

## 2019-08-05 DIAGNOSIS — Z8249 Family history of ischemic heart disease and other diseases of the circulatory system: Secondary | ICD-10-CM

## 2019-08-05 DIAGNOSIS — Z136 Encounter for screening for cardiovascular disorders: Secondary | ICD-10-CM | POA: Diagnosis not present

## 2019-08-05 DIAGNOSIS — N401 Enlarged prostate with lower urinary tract symptoms: Secondary | ICD-10-CM | POA: Diagnosis not present

## 2019-08-05 DIAGNOSIS — Z111 Encounter for screening for respiratory tuberculosis: Secondary | ICD-10-CM

## 2019-08-05 DIAGNOSIS — Z131 Encounter for screening for diabetes mellitus: Secondary | ICD-10-CM | POA: Diagnosis not present

## 2019-08-05 DIAGNOSIS — Z87891 Personal history of nicotine dependence: Secondary | ICD-10-CM

## 2019-08-05 DIAGNOSIS — Z1329 Encounter for screening for other suspected endocrine disorder: Secondary | ICD-10-CM | POA: Diagnosis not present

## 2019-08-05 DIAGNOSIS — E559 Vitamin D deficiency, unspecified: Secondary | ICD-10-CM | POA: Diagnosis not present

## 2019-08-05 DIAGNOSIS — K219 Gastro-esophageal reflux disease without esophagitis: Secondary | ICD-10-CM

## 2019-08-05 DIAGNOSIS — E782 Mixed hyperlipidemia: Secondary | ICD-10-CM

## 2019-08-05 DIAGNOSIS — Z1211 Encounter for screening for malignant neoplasm of colon: Secondary | ICD-10-CM

## 2019-08-05 DIAGNOSIS — C880 Waldenstrom macroglobulinemia: Secondary | ICD-10-CM

## 2019-08-05 DIAGNOSIS — Z130 Encounter for screening for diseases of the blood and blood-forming organs and certain disorders involving the immune mechanism: Secondary | ICD-10-CM

## 2019-08-05 DIAGNOSIS — Z79899 Other long term (current) drug therapy: Secondary | ICD-10-CM

## 2019-08-05 DIAGNOSIS — N138 Other obstructive and reflux uropathy: Secondary | ICD-10-CM

## 2019-08-05 DIAGNOSIS — R7309 Other abnormal glucose: Secondary | ICD-10-CM

## 2019-08-05 DIAGNOSIS — R5383 Other fatigue: Secondary | ICD-10-CM

## 2019-08-05 MED FILL — IMBRUVICA 420 MG TAB: 420 | 28 days supply | Qty: 28 | Fill #6

## 2019-08-05 NOTE — Patient Instructions (Signed)
Vit D  & Vit C 1,000 mg   are recommended to help protect  against the Covid-19 and other Corona viruses.    Also it's recommended  to take  Zinc 50 mg  to help  protect against the Covid-19   and best place to get  is also on [employer]  and don't pay more than 6-8 cents /pill !  =============================== Coronavirus (COVID-19) Are you at risk?  Are you at risk for the Coronavirus (COVID-19)?  To be considered HIGH RISK for Coronavirus (COVID-19), you have to meet the following criteria:  . Traveled to [place], [place], [place], [place] or [place]; or in the [place] to [place], [place], [place]  . or [place]; and have fever, cough, and shortness of breath within the last 2 weeks of travel OR . Been in close contact with a person diagnosed with COVID-19 within the last 2 weeks and have  . fever, cough,and shortness of breath .  . IF YOU DO NOT MEET THESE CRITERIA, YOU ARE CONSIDERED LOW RISK FOR COVID-19.  What to do if you are HIGH RISK for COVID-19?  . If you are having a medical emergency, call 911. . Seek medical care right away. Before you go to a doctor's office, urgent care or emergency department, .  call ahead and tell them about your recent travel, contact with someone diagnosed with COVID-19  .  and your symptoms.  . You should receive instructions from your physician's office regarding next steps of care.  . When you arrive at healthcare provider, tell the healthcare staff immediately you have returned from  . visiting [place], [place], [place], [place] or [place]; or traveled in the [place] to [place], [place],  . [place] or [place] in the last two weeks or you have been in close contact with a person diagnosed with  . COVID-19 in the last 2 weeks.   . Tell the health care staff about your symptoms: fever, cough and shortness of breath. . After you have been seen by a medical provider, you will be either: o Tested for (COVID-19) and  discharged home on quarantine except to seek medical care if  o symptoms worsen, and asked to  - Stay home and avoid contact with others until you get your results (4-5 days)  - Avoid travel on public transportation if possible (such as bus, train, or airplane) or o Sent to the Emergency Department by EMS for evaluation, COVID-19 testing  and  o possible admission depending on your condition and test results.  What to do if you are LOW RISK for COVID-19?  Reduce your risk of any infection by using the same precautions used for avoiding the common cold or flu:  . Wash your hands often with soap and warm water for at least 20 seconds.  If soap and water are not readily available,  . use an alcohol-based hand sanitizer with at least 60% alcohol.  . If coughing or sneezing, cover your mouth and nose by coughing or sneezing into the elbow areas of your shirt or coat, .  into a tissue or into your sleeve (not your hands). . Avoid shaking hands with others and consider head nods or verbal greetings only. . Avoid touching your eyes, nose, or mouth with unwashed hands.  . Avoid close contact with people who are sick. . Avoid places or events with large numbers of people in one location, like concerts or sporting events. .   Carefully consider travel plans you have or are making. . If you are planning any travel outside or inside the [place], visit the CDC's Travelers' Health webpage for the latest health notices. . If you have some symptoms but not all symptoms, continue to monitor at home and seek medical attention  . if your symptoms worsen. . If you are having a medical emergency, call 911. >>>>>>>>>>>>>>>>>>>>>>>>>>>> Preventive Care for Adults  A healthy lifestyle and preventive care can promote health and wellness. Preventive health guidelines for men include the following key practices:  A routine yearly physical is a good way to check with your health care provider about your health and  preventative screening. It is a chance to share any concerns and updates on your health and to receive a thorough exam.  Visit your dentist for a routine exam and preventative care every 6 months. Brush your teeth twice a day and floss once a day. Good oral hygiene prevents tooth decay and gum disease.  The frequency of eye exams is based on your age, health, family medical history, use of contact lenses, and other factors. Follow your health care provider's recommendations for frequency of eye exams.  Eat a healthy diet. Foods such as vegetables, fruits, whole grains, low-fat dairy products, and lean protein foods contain the nutrients you need without too many calories. Decrease your intake of foods high in solid fats, added sugars, and salt. Eat the right amount of calories for you. Get information about a proper diet from your health care provider, if necessary.  Regular physical exercise is one of the most important things you can do for your health. Most adults should get at least 150 minutes of moderate-intensity exercise (any activity that increases your heart rate and causes you to sweat) each week. In addition, most adults need muscle-strengthening exercises on 2 or more days a week.  Maintain a healthy weight. The body mass index (BMI) is a screening tool to identify possible weight problems. It provides an estimate of body fat based on height and weight. Your health care provider can find your BMI and can help you achieve or maintain a healthy weight. For adults 20 years and older:  A BMI below 18.5 is considered underweight.  A BMI of 18.5 to 24.9 is normal.  A BMI of 25 to 29.9 is considered overweight.  A BMI of 30 and above is considered obese.  Maintain normal blood lipids and cholesterol levels by exercising and minimizing your intake of saturated fat. Eat a balanced diet with plenty of fruit and vegetables. Blood tests for lipids and cholesterol should begin at age 20 and be  repeated every 5 years. If your lipid or cholesterol levels are high, you are over 50, or you are at high risk for heart disease, you may need your cholesterol levels checked more frequently. Ongoing high lipid and cholesterol levels should be treated with medicines if diet and exercise are not working.  If you smoke, find out from your health care provider how to quit. If you do not use tobacco, do not start.  Lung cancer screening is recommended for adults aged 55-80 years who are at high risk for developing lung cancer because of a history of smoking. A yearly low-dose CT scan of the lungs is recommended for people who have at least a 30-pack-year history of smoking and are a current smoker or have quit within the past 15 years. A pack year of smoking is smoking an average of 1   pack of cigarettes a day for 1 year (for example: 1 pack a day for 30 years or 2 packs a day for 15 years). Yearly screening should continue until the smoker has stopped smoking for at least 15 years. Yearly screening should be stopped for people who develop a health problem that would prevent them from having lung cancer treatment.  If you choose to drink alcohol, do not have more than 2 drinks per day. One drink is considered to be 12 ounces (355 mL) of beer, 5 ounces (148 mL) of wine, or 1.5 ounces (44 mL) of liquor.  Avoid use of street drugs. Do not share needles with anyone. Ask for help if you need support or instructions about stopping the use of drugs.  High blood pressure causes heart disease and increases the risk of stroke. Your blood pressure should be checked at least every 1-2 years. Ongoing high blood pressure should be treated with medicines, if weight loss and exercise are not effective.  If you are 45-79 years old, ask your health care provider if you should take aspirin to prevent heart disease.  Diabetes screening involves taking a blood sample to check your fasting blood sugar level. This should be done  once every 3 years, after age 45, if you are within normal weight and without risk factors for diabetes. Testing should be considered at a younger age or be carried out more frequently if you are overweight and have at least 1 risk factor for diabetes.  Colorectal cancer can be detected and often prevented. Most routine colorectal cancer screening begins at the age of 50 and continues through age 75. However, your health care provider may recommend screening at an earlier age if you have risk factors for colon cancer. On a yearly basis, your health care provider may provide home test kits to check for hidden blood in the stool. Use of a small camera at the end of a tube to directly examine the colon (sigmoidoscopy or colonoscopy) can detect the earliest forms of colorectal cancer. Talk to your health care provider about this at age 50, when routine screening begins. Direct exam of the colon should be repeated every 5-10 years through age 75, unless early forms of precancerous polyps or small growths are found.   Talk with your health care provider about prostate cancer screening.  Testicular cancer screening isrecommended for adult males. Screening includes self-exam, a health care provider exam, and other screening tests. Consult with your health care provider about any symptoms you have or any concerns you have about testicular cancer.  Use sunscreen. Apply sunscreen liberally and repeatedly throughout the day. You should seek shade when your shadow is shorter than you. Protect yourself by wearing long sleeves, pants, a wide-brimmed hat, and sunglasses year round, whenever you are outdoors.  Once a month, do a whole-body skin exam, using a mirror to look at the skin on your back. Tell your health care provider about new moles, moles that have irregular borders, moles that are larger than a pencil eraser, or moles that have changed in shape or color.  Stay current with required vaccines  (immunizations).  Influenza vaccine. All adults should be immunized every year.  Tetanus, diphtheria, and acellular pertussis (Td, Tdap) vaccine. An adult who has not previously received Tdap or who does not know his vaccine status should receive 1 dose of Tdap. This initial dose should be followed by tetanus and diphtheria toxoids (Td) booster doses every 10 years. Adults with an   unknown or incomplete history of completing a 3-dose immunization series with Td-containing vaccines should begin or complete a primary immunization series including a Tdap dose. Adults should receive a Td booster every 10 years.  Varicella vaccine. An adult without evidence of immunity to varicella should receive 2 doses or a second dose if he has previously received 1 dose.  Human papillomavirus (HPV) vaccine. Males aged 13-21 years who have not received the vaccine previously should receive the 3-dose series. Males aged 22-26 years may be immunized. Immunization is recommended through the age of 26 years for any male who has sex with males and did not get any or all doses earlier. Immunization is recommended for any person with an immunocompromised condition through the age of 26 years if he did not get any or all doses earlier. During the 3-dose series, the second dose should be obtained 4-8 weeks after the first dose. The third dose should be obtained 24 weeks after the first dose and 16 weeks after the second dose.  Zoster vaccine. One dose is recommended for adults aged 60 years or older unless certain conditions are present.    PREVNAR  - Pneumococcal 13-valent conjugate (PCV13) vaccine. When indicated, a person who is uncertain of his immunization history and has no record of immunization should receive the PCV13 vaccine. An adult aged 19 years or older who has certain medical conditions and has not been previously immunized should receive 1 dose of PCV13 vaccine. This PCV13 should be followed with a dose of  pneumococcal polysaccharide (PPSV23) vaccine. The PPSV23 vaccine dose should be obtained at least 1 r more year(s) after the dose of PCV13 vaccine. An adult aged 19 years or older who has certain medical conditions and previously received 1 or more doses of PPSV23 vaccine should receive 1 dose of PCV13. The PCV13 vaccine dose should be obtained 1 or more years after the last PPSV23 vaccine dose.    PNEUMOVAX - Pneumococcal polysaccharide (PPSV23) vaccine. When PCV13 is also indicated, PCV13 should be obtained first. All adults aged 65 years and older should be immunized. An adult younger than age 65 years who has certain medical conditions should be immunized. Any person who resides in a nursing home or long-term care facility should be immunized. An adult smoker should be immunized. People with an immunocompromised condition and certain other conditions should receive both PCV13 and PPSV23 vaccines. People with human immunodeficiency virus (HIV) infection should be immunized as soon as possible after diagnosis. Immunization during chemotherapy or radiation therapy should be avoided. Routine use of PPSV23 vaccine is not recommended for American Indians, [place] Natives, or people younger than 65 years unless there are medical conditions that require PPSV23 vaccine. When indicated, people who have unknown immunization and have no record of immunization should receive PPSV23 vaccine. One-time revaccination 5 years after the first dose of PPSV23 is recommended for people aged 19-64 years who have chronic kidney failure, nephrotic syndrome, asplenia, or immunocompromised conditions. People who received 1-2 doses of PPSV23 before age 65 years should receive another dose of PPSV23 vaccine at age 65 years or later if at least 5 years have passed since the previous dose. Doses of PPSV23 are not needed for people immunized with PPSV23 at or after age 65 years.    Hepatitis A vaccine. Adults who wish to be protected  from this disease, have certain high-risk conditions, work with hepatitis A-infected animals, work in hepatitis A research labs, or travel to or work in countries with   a high rate of hepatitis A should be immunized. Adults who were previously unvaccinated and who anticipate close contact with an international adoptee during the first 60 days after arrival in the [place] from a country with a high rate of hepatitis A should be immunized.    Hepatitis B vaccine. Adults should be immunized if they wish to be protected from this disease, have certain high-risk conditions, may be exposed to blood or other infectious body fluids, are household contacts or sex partners of hepatitis B positive people, are clients or workers in certain care facilities, or travel to or work in countries with a high rate of hepatitis B.   Preventive Service / Frequency   Ages 40 to 64  Blood pressure check.  Lipid and cholesterol check  Lung cancer screening. / Every year if you are aged 55-80 years and have a 30-pack-year history of smoking and currently smoke or have quit within the past 15 years. Yearly screening is stopped once you have quit smoking for at least 15 years or develop a health problem that would prevent you from having lung cancer treatment.  Fecal occult blood test (FOBT) of stool. / Every year beginning at age 50 and continuing until age 75. You may not have to do this test if you get a colonoscopy every 10 years.  Flexible sigmoidoscopy** or colonoscopy.** / Every 5 years for a flexible sigmoidoscopy or every 10 years for a colonoscopy beginning at age 50 and continuing until age 75. Screening for abdominal aortic aneurysm (AAA)  by ultrasound is recommended for people who have history of high blood pressure or who are current or former smokers. +++++++++++ Recommend Adult Low Dose Aspirin or  coated  Aspirin 81 mg daily  To reduce risk of Colon Cancer 40 %,  Skin Cancer 26 % ,  Malignant  Melanoma 46%  and  Pancreatic cancer 60% ++++++++++++++++++++ Vitamin D goal  is between 70-100.  Please make sure that you are taking your Vitamin D as directed.  It is very important as a natural anti-inflammatory  helping hair, skin, and nails, as well as reducing stroke and heart attack risk.  It helps your bones and helps with mood. It also decreases numerous cancer risks so please take it as directed.  Low Vit D is associated with a 200-300% higher risk for CANCER  and 200-300% higher risk for HEART   ATTACK  &  STROKE.   ...................................... It is also associated with higher death rate at younger ages,  autoimmune diseases like Rheumatoid arthritis, Lupus, Multiple Sclerosis.    Also many other serious conditions, like depression, Alzheimer's Dementia, infertility, muscle aches, fatigue, fibromyalgia - just to name a few. +++++++++++++++++++++ Recommend the book "The END of DIETING" by Dr Joel Fuhrman  & the book "The END of DIABETES " by Dr Joel Fuhrman At [employer] - get book & Audio CD's    Being diabetic has a  300% increased risk for heart attack, stroke, cancer, and alzheimer- type vascular dementia. It is very important that you work harder with diet by avoiding all foods that are white. Avoid white rice (Egger & wild rice is OK), white potatoes (sweetpotatoes in moderation is OK), White bread or wheat bread or anything made out of white flour like bagels, donuts, rolls, buns, biscuits, cakes, pastries, cookies, pizza crust, and pasta (made from white flour & egg whites) - vegetarian pasta or spinach or wheat pasta is OK. Multigrain breads like Arnold's or Pepperidge Farm,   or multigrain sandwich thins or flatbreads.  Diet, exercise and weight loss can reverse and cure diabetes in the early stages.  Diet, exercise and weight loss is very important in the control and prevention of complications of diabetes which affects every system in your body, ie. Brain -  dementia/stroke, eyes - glaucoma/blindness, heart - heart attack/heart failure, kidneys - dialysis, stomach - gastric paralysis, intestines - malabsorption, nerves - severe painful neuritis, circulation - gangrene & loss of a leg(s), and finally cancer and Alzheimers.    I recommend avoid fried & greasy foods,  sweets/candy, white rice (Kam or wild rice or Quinoa is OK), white potatoes (sweet potatoes are OK) - anything made from white flour - bagels, doughnuts, rolls, buns, biscuits,white and wheat breads, pizza crust and traditional pasta made of white flour & egg white(vegetarian pasta or spinach or wheat pasta is OK).  Multi-grain bread is OK - like multi-grain flat bread or sandwich thins. Avoid alcohol in excess. Exercise is also important.    Eat all the vegetables you want - avoid meat, especially red meat and dairy - especially cheese.  Cheese is the most concentrated form of trans-fats which is the worst thing to clog up our arteries. Veggie cheese is OK which can be found in the fresh produce section at [employer] or [employer] or [employer]  ++++++++++++++++++++++ DASH Eating Plan  DASH stands for "Dietary Approaches to Stop Hypertension."   The DASH eating plan is a healthy eating plan that has been shown to reduce high blood pressure (hypertension). Additional health benefits may include reducing the risk of type 2 diabetes mellitus, heart disease, and stroke. The DASH eating plan may also help with weight loss. WHAT DO I NEED TO KNOW ABOUT THE DASH EATING PLAN? For the DASH eating plan, you will follow these general guidelines:  Choose foods with a percent daily value for sodium of less than 5% (as listed on the food label).  Use salt-free seasonings or herbs instead of table salt or sea salt.  Check with your health care provider or pharmacist before using salt substitutes.  Eat lower-sodium products, often labeled as "lower sodium" or "no salt added."  Eat fresh  foods.  Eat more vegetables, fruits, and low-fat dairy products.  Choose whole grains. Look for the word "whole" as the first word in the ingredient list.  Choose fish   Limit sweets, desserts, sugars, and sugary drinks.  Choose heart-healthy fats.  Eat veggie cheese   Eat more home-cooked food and less restaurant, buffet, and fast food.  Limit fried foods.  Cook foods using methods other than frying.  Limit canned vegetables. If you do use them, rinse them well to decrease the sodium.  When eating at a restaurant, ask that your food be prepared with less salt, or no salt if possible.                      WHAT FOODS CAN I EAT? Read Dr Joel Fuhrman's books on The End of Dieting & The End of Diabetes  Grains Whole grain or whole wheat bread. Plunk rice. Whole grain or whole wheat pasta. Quinoa, bulgur, and whole grain cereals. Low-sodium cereals. Corn or whole wheat flour tortillas. Whole grain cornbread. Whole grain crackers. Low-sodium crackers.  Vegetables Fresh or frozen vegetables (raw, steamed, roasted, or grilled). Low-sodium or reduced-sodium tomato and vegetable juices. Low-sodium or reduced-sodium tomato sauce and paste. Low-sodium or reduced-sodium canned vegetables.   Fruits All fresh, canned (  in natural juice), or frozen fruits.  Protein Products  All fish and seafood.  Dried beans, peas, or lentils. Unsalted nuts and seeds. Unsalted canned beans.  Dairy Low-fat dairy products, such as skim or 1% milk, 2% or reduced-fat cheeses, low-fat ricotta or cottage cheese, or plain low-fat yogurt. Low-sodium or reduced-sodium cheeses.  Fats and Oils Tub margarines without trans fats. Light or reduced-fat mayonnaise and salad dressings (reduced sodium). Avocado. Safflower, olive, or canola oils. Natural peanut or almond butter.  Other Unsalted popcorn and pretzels. The items listed above may not be a complete list of recommended foods or beverages. Contact your  dietitian for more options.  +++++++++++++++++++  WHAT FOODS ARE NOT RECOMMENDED? Grains/ White flour or wheat flour White bread. White pasta. White rice. Refined cornbread. Bagels and croissants. Crackers that contain trans fat.  Vegetables  Creamed or fried vegetables. Vegetables in a . Regular canned vegetables. Regular canned tomato sauce and paste. Regular tomato and vegetable juices.  Fruits Dried fruits. Canned fruit in light or heavy syrup. Fruit juice.  Meat and Other Protein Products Meat in general - RED meat & White meat.  Fatty cuts of meat. Ribs, chicken wings, all processed meats as bacon, sausage, bologna, salami, fatback, hot dogs, bratwurst and packaged luncheon meats.  Dairy Whole or 2% milk, cream, half-and-half, and cream cheese. Whole-fat or sweetened yogurt. Full-fat cheeses or blue cheese. Non-dairy creamers and whipped toppings. Processed cheese, cheese spreads, or cheese curds.  Condiments Onion and garlic salt, seasoned salt, table salt, and sea salt. Canned and packaged gravies. Worcestershire sauce. Tartar sauce. Barbecue sauce. Teriyaki sauce. Soy sauce, including reduced sodium. Steak sauce. Fish sauce. Oyster sauce. Cocktail sauce. Horseradish. Ketchup and mustard. Meat flavorings and tenderizers. Bouillon cubes. Hot sauce. Tabasco sauce. Marinades. Taco seasonings. Relishes.  Fats and Oils Butter, stick margarine, lard, shortening and bacon fat. Coconut, palm kernel, or palm oils. Regular salad dressings.  Pickles and olives. Salted popcorn and pretzels.  The items listed above may not be a complete list of foods and beverages to avoid.    

## 2019-08-05 NOTE — Progress Notes (Signed)
Annual  Screening/Preventative Visit  & Comprehensive Evaluation & Examination     [name]  presents for a Screening /Preventative Visit & comprehensive evaluation and management of multiple medical co-morbidities.  Patient has been followed for HTN, HLD, Prediabetes and Vitamin D Deficiency. 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   In May 2019 he was dx'd with Waldenstrom's macroglobulinemia by Dr Edwin Webster and started on Chemotx. Patient also has GERD controlled on his meds.     HTN predates since 2013. Patient's BP has been controlled at home.  Today's BP is at goal -  126/84. Patient denies any cardiac symptoms as chest pain, palpitations, shortness of breath, dizziness or ankle swelling.     Patient's hyperlipidemia is not controlled with diet and medications. Patient denies myalgias or other medication SE's. Last lipids were not at goal: Lab Results  Component Value Date   CHOL 251 (H) 04/23/2019   HDL 55 04/23/2019   LDLCALC 179 (H) 04/23/2019   TRIG 65 04/23/2019   CHOLHDL 4.6 04/23/2019      Patient has hx/o glucose intolerance and patient denies reactive hypoglycemic symptoms, visual blurring, diabetic polys or paresthesias. Last A1c was Normal & at goal: Lab Results  Component Value Date   HGBA1C 5.2 04/23/2019       Finally, patient has history of Vitamin D Deficiency ("32" / 2017) and last vitamin D was at goal:  Lab Results  Component Value Date   VD25OH 66 04/23/2019   Current Outpatient Medications on File Prior to Visit  Medication Sig  . ALPRAZolam (XANAX) 1 MG tablet Take 1/2 to 1 tablet 2 to 3 x /day ONLY if needed for Anxiety Attack(s)  . aspirin EC 81 MG tablet Take 81 mg daily by mouth.  Marland Kitchen atenolol (TENORMIN) 100 MG tablet Take 1 tablet every evening for BP  . buPROPion (WELLBUTRIN XL) 300 MG 24 hr tablet TAKE 1 TABLET EVERY MORNING FOR MOOD  . busPIRone (BUSPAR) 5 MG  tablet Take 1/2 to 1 tablet 3 x /day for Anxiety  . Cholecalciferol (VITAMIN D-3) 5000 UNITS TABS Take 10,000 Units by mouth daily.   Marland Kitchen escitalopram (LEXAPRO) 20 MG tablet TAKE 1 TABLET BY MOUTH EVERY DAY  . famciclovir (FAMVIR) 500 MG tablet Take 1 tablet by mouth daily.  . famotidine (PEPCID) 40 MG tablet Take 1 tabet Daily for Heartburn and Indigestion  . fluticasone (FLONASE) 50 MCG/ACT nasal spray Instill 2 sprays each Nares 2 x /day  . folic acid (FOLVITE) 1 MG tablet Take 1 mg by mouth daily.  Marland Kitchen gabapentin (NEURONTIN) 300 MG capsule TAKE 1 CAPSULE (300 MG TOTAL) BY MOUTH 3 (THREE) TIMES DAILY AS NEEDED. FOR SHINGLES PAIN  . ibuprofen (ADVIL,MOTRIN) 800 MG tablet Take 1 tablet (800 mg total) by mouth daily as needed.  . IMBRUVICA 420 MG TABS TAKE 1 TABLET BY MOUTH DAILY  . levocetirizine (XYZAL) 5 MG tablet TAKE 1 TABLET BY MOUTH EVERY DAY IN THE EVENING  . losartan (COZAAR) 100 MG tablet Take 1 tablet at Bedtime for BP  . rosuvastatin (CRESTOR) 40 MG tablet Take 1 tablet Daily for Cholesterol  . triamcinolone ointment (KENALOG) 0.1 % Apply 1 application topically 2 (two) times daily.  . vitamin B-12 (CYANOCOBALAMIN) 500 MCG tablet Take 500 mcg by mouth daily.  . [DISCONTINUED] prochlorperazine (COMPAZINE) 10 MG tablet Take 1 tablet (10 mg total) by mouth every  6 (six) hours as needed (Nausea or vomiting).   No current facility-administered medications on file prior to visit.    Allergies  Allergen Reactions  . Adalimumab Other (See Comments)    "blacked out", confused "blacked out"   Past Medical History:  Diagnosis Date  . Allergy   . Anemia   . Anxiety   . Arthritis   . Chest pain, non-cardiac   . COPD (chronic obstructive pulmonary disease) ([place])   . Counseling regarding goals of care 03/27/2018  . Depression   . Diverticulitis   . Diverticulosis   . Fractured pelvis ([place])    fall from ladder  . GERD (gastroesophageal reflux disease)   . Gout   . History of shingles    . Hyperlipidemia   . Internal hemorrhoids   . Iron deficiency anemia 03/13/2018  . Kidney stones   . Other abnormal glucose 10/11/2015  . Rheumatoid arthritis ([place])    hands  . Waldenstrom's macroglobulinemia ([place]) 03/27/2018   Health Maintenance  Topic Date Due  . INFLUENZA VACCINE  06/27/2019  . COLONOSCOPY  10/12/2020  . TETANUS/TDAP  06/03/2021  . Hepatitis C Screening  Completed  . HIV Screening  Completed   Immunization History  Administered Date(s) Administered  . DTaP 06/04/2011  . Hepatitis B 11/26/2010  . Hepatitis B, ped/adol 08/06/2011  . Influenza Inj Mdck Quad With Preservative 09/13/2017, 08/21/2018  . Influenza Split 09/02/2014  . Influenza Whole 09/17/2013  . Influenza, Seasonal, Injecte, Preservative Fre 10/11/2015  . Influenza-Unspecified 09/13/2017  . PPD Test 10/11/2015   Last Colon - 10/12/2016 - Dr Edwin Webster - Recc 5 yr f/u due Nov 2021  Past Surgical History:  Procedure Laterality Date  . COLONOSCOPY    . FINGER SURGERY    . iron infusion    . KNEE SURGERY     right  . ROTATOR CUFF REPAIR  1996   left   Family History  Problem Relation Age of Onset  . Hyperlipidemia Mother   . COPD Mother   . Heart failure Mother   . Cancer Mother        breast  . Hyperlipidemia Brother   . Stroke Maternal Grandfather   . COPD Maternal Grandfather        colon  . Colon cancer Maternal Grandfather 35       died at 43  . Hypertension Father   . Esophageal cancer Neg Hx   . Stomach cancer Neg Hx   . Rectal cancer Neg Hx    Social History   Socioeconomic History  . Marital status: Married    Spouse name: Edwin Webster  . Number of children: 1 son & 2 daughters  Occupational History  . Not on file  Tobacco Use  . Smoking status: Former Smoker    Packs/day: 2.00    Types: Cigarettes    Quit date: 07/03/2010    Years since quitting: 9.0  . Smokeless tobacco: Never Used  Substance and Sexual Activity  . Alcohol use: No    Alcohol/week: 0.0 standard drinks   . Drug use: No  . Sexual activity: Not on file    ROS Constitutional: Denies fever, chills, weight loss/gain, headaches, insomnia,  night sweats or change in appetite. Does c/o fatigue. Eyes: Denies redness, blurred vision, diplopia, discharge, itchy or watery eyes.  ENT: Denies discharge, congestion, post nasal drip, epistaxis, sore throat, earache, hearing loss, dental pain, Tinnitus, Vertigo, Sinus pain or snoring.  Cardio: Denies chest pain, palpitations, irregular heartbeat, syncope,  dyspnea, diaphoresis, orthopnea, PND, claudication or edema Respiratory: denies cough, dyspnea, DOE, pleurisy, hoarseness, laryngitis or wheezing.  Gastrointestinal: Denies dysphagia, heartburn, reflux, water brash, pain, cramps, nausea, vomiting, bloating, diarrhea, constipation, hematemesis, melena, hematochezia, jaundice or hemorrhoids Genitourinary: Denies dysuria, frequency, urgency, nocturia, hesitancy, discharge, hematuria or flank pain Musculoskeletal: Denies arthralgia, myalgia, stiffness, Jt. Swelling, pain, limp or strain/sprain. Denies Falls. Skin: Denies puritis, rash, hives, warts, acne, eczema or change in skin lesion Neuro: No weakness, tremor, incoordination, spasms, paresthesia or pain Psychiatric: Denies confusion, memory loss or sensory loss. Denies Depression. Endocrine: Denies change in weight, skin, hair change, nocturia, and paresthesia, diabetic polys, visual blurring or hyper / hypo glycemic episodes.  Heme/Lymph: No excessive bleeding, bruising or enlarged lymph nodes.  Physical Exam  BP 126/84   Pulse 72   Temp (!) 97 F (36.1 C)   Ht 5' 9.5" (1.765 m)   Wt 173 lb 6.4 oz (78.7 kg)   BMI 25.24 kg/m   General Appearance: Well nourished and well groomed and in no apparent distress.  Eyes: PERRLA, EOMs, conjunctiva no swelling or erythema, normal fundi and vessels. Sinuses: No frontal/maxillary tenderness ENT/Mouth: EACs patent / TMs  nl. Nares clear without erythema,  swelling, mucoid exudates. Oral hygiene is good. No erythema, swelling, or exudate. Tongue normal, non-obstructing. Tonsils not swollen or erythematous. Hearing normal.  Neck: Supple, thyroid not palpable. No bruits, nodes or JVD. Respiratory: Respiratory effort normal.  BS equal and clear bilateral without rales, rhonci, wheezing or stridor. Cardio: Heart sounds are normal with regular rate and rhythm and no murmurs, rubs or gallops. Peripheral pulses are normal and equal bilaterally without edema. No aortic or femoral bruits. Chest: symmetric with normal excursions and percussion.  Abdomen: Soft, with Nl bowel sounds. Nontender, no guarding, rebound, hernias, masses, or organomegaly.  Lymphatics: Non tender without lymphadenopathy.  Musculoskeletal: Full ROM all peripheral extremities, joint stability, 5/5 strength, and normal gait. Skin: Warm and dry without rashes, lesions, cyanosis, clubbing or  ecchymosis.  Neuro: Cranial nerves intact, reflexes equal bilaterally. Normal muscle tone, no cerebellar symptoms. Sensation intact.  Pysch: Alert and oriented X 3 with normal affect, insight and judgment appropriate.   Assessment and Plan  1. Annual Preventative/Screening Exam   2. Essential hypertension  - EKG 12-Lead - [place], RETROPERITNL ABD,  LTD - Urinalysis, Routine w reflex microscopic - Microalbumin / creatinine urine ratio - CBC with Differential/Platelet - COMPLETE METABOLIC PANEL WITH GFR - Magnesium - TSH  3. Hyperlipidemia, mixed  - EKG 12-Lead - [place], RETROPERITNL ABD,  LTD - Lipid panel - TSH  4. Abnormal glucose  - EKG 12-Lead - [place], RETROPERITNL ABD,  LTD - Hemoglobin A1c - Insulin, random  5. Vitamin D deficiency  - VITAMIN D 25 Hydroxyl  6. Waldenstrom's macroglobulinemia ([place])  - CBC with Differential/Platelet - COMPLETE METABOLIC PANEL WITH GFR  7. BPH with obstruction/lower urinary tract symptoms  - PSA  8. Gastroesophageal reflux disease  - CBC  with Differential/Platelet  9. Screening for colorectal cancer  - POC Hemoccult Bld/Stl  10. Screening-pulmonary TB  - TB Skin Test  11. Prostate cancer screening  - PSA  12. Screening for ischemic heart disease   13. FHx: heart disease  - EKG 12-Lead - [place], RETROPERITNL ABD,  LTD  14. Screening for AAA (aortic abdominal aneurysm)   15. Former smoker  - EKG 12-Lead  16. Fatigue, unspecified type  - Iron,Total/Total Iron Binding Cap - Vitamin B12 - Testosterone - CBC with Differential/Platelet  17.  Medication management  - Urinalysis, Routine w reflex microscopic - Microalbumin / creatinine urine ratio - CBC with Differential/Platelet - COMPLETE METABOLIC PANEL WITH GFR - Magnesium - Lipid panel - TSH - Hemoglobin A1c - Insulin, random - VITAMIN D 25 Hydroxyl       Patient was counseled in prudent diet, weight control to achieve/maintain BMI less than 25, BP monitoring, regular exercise and medications as discussed.  Discussed med effects and SE's. Routine screening labs and tests as requested with regular follow-up as recommended. Over 40 minutes of exam, counseling, chart review and high complex critical decision making was performed   Kirtland Bouchard, MD

## 2019-08-06 ENCOUNTER — Encounter: Payer: Self-pay | Admitting: Hematology & Oncology

## 2019-08-06 ENCOUNTER — Inpatient Hospital Stay: Payer: 59 | Admitting: Hematology & Oncology

## 2019-08-06 ENCOUNTER — Inpatient Hospital Stay: Payer: 59 | Attending: Hematology & Oncology

## 2019-08-06 ENCOUNTER — Telehealth: Payer: Self-pay | Admitting: *Deleted

## 2019-08-06 ENCOUNTER — Other Ambulatory Visit: Payer: Self-pay

## 2019-08-06 VITALS — BP 157/88 | HR 61 | Temp 97.0°F | Resp 18 | Wt 172.0 lb

## 2019-08-06 DIAGNOSIS — D509 Iron deficiency anemia, unspecified: Secondary | ICD-10-CM | POA: Insufficient documentation

## 2019-08-06 DIAGNOSIS — C880 Waldenstrom macroglobulinemia: Secondary | ICD-10-CM | POA: Insufficient documentation

## 2019-08-06 LAB — CBC WITH DIFFERENTIAL/PLATELET
Absolute Monocytes: 566 cells/uL (ref 200–950)
Basophils Absolute: 61 cells/uL (ref 0–200)
Basophils Relative: 1.2 %
Eosinophils Absolute: 61 cells/uL (ref 15–500)
Eosinophils Relative: 1.2 %
HCT: 42.6 % (ref 38.5–50.0)
Hemoglobin: 14.4 g/dL (ref 13.2–17.1)
Lymphs Abs: 944 cells/uL (ref 850–3900)
MCH: 31.9 pg (ref 27.0–33.0)
MCHC: 33.8 g/dL (ref 32.0–36.0)
MCV: 94.2 fL (ref 80.0–100.0)
MPV: 11.5 fL (ref 7.5–12.5)
Monocytes Relative: 11.1 %
Neutro Abs: 3468 cells/uL (ref 1500–7800)
Neutrophils Relative %: 68 %
Platelets: 256 10*3/uL (ref 140–400)
RBC: 4.52 10*6/uL (ref 4.20–5.80)
RDW: 14 % (ref 11.0–15.0)
Total Lymphocyte: 18.5 %
WBC: 5.1 10*3/uL (ref 3.8–10.8)

## 2019-08-06 LAB — HEMOGLOBIN A1C
Hgb A1c MFr Bld: 5.3 % of total Hgb (ref <5.7)
Mean Plasma Glucose: 105 (calc)
eAG (mmol/L): 5.8 (calc)

## 2019-08-06 LAB — CBC WITH DIFFERENTIAL (CANCER CENTER ONLY)
Abs Immature Granulocytes: 0.04 10*3/uL (ref 0.00–0.07)
Basophils Absolute: 0.1 10*3/uL (ref 0.0–0.1)
Basophils Relative: 2 %
Eosinophils Absolute: 0.1 10*3/uL (ref 0.0–0.5)
Eosinophils Relative: 2 %
HCT: 43.8 % (ref 39.0–52.0)
Hemoglobin: 14.5 g/dL (ref 13.0–17.0)
Immature Granulocytes: 1 %
Lymphocytes Relative: 18 %
Lymphs Abs: 1 10*3/uL (ref 0.7–4.0)
MCH: 32.2 pg (ref 26.0–34.0)
MCHC: 33.1 g/dL (ref 30.0–36.0)
MCV: 97.1 fL (ref 80.0–100.0)
Monocytes Absolute: 0.6 10*3/uL (ref 0.1–1.0)
Monocytes Relative: 12 %
Neutro Abs: 3.4 10*3/uL (ref 1.7–7.7)
Neutrophils Relative %: 65 %
Platelet Count: 250 10*3/uL (ref 150–400)
RBC: 4.51 MIL/uL (ref 4.22–5.81)
RDW: 13.3 % (ref 11.5–15.5)
WBC Count: 5.2 10*3/uL (ref 4.0–10.5)
nRBC: 0 % (ref 0.0–0.2)

## 2019-08-06 LAB — COMPLETE METABOLIC PANEL WITH GFR
AG Ratio: 2.3 (calc) (ref 1.0–2.5)
ALT: 10 U/L (ref 9–46)
AST: 17 U/L (ref 10–35)
Albumin: 4.6 g/dL (ref 3.6–5.1)
Alkaline phosphatase (APISO): 78 U/L (ref 35–144)
BUN: 14 mg/dL (ref 7–25)
CO2: 29 mmol/L (ref 20–32)
Calcium: 10 mg/dL (ref 8.6–10.3)
Chloride: 105 mmol/L (ref 98–110)
Creat: 1.15 mg/dL (ref 0.70–1.25)
GFR, Est African American: 80 mL/min/{1.73_m2} (ref >=60)
GFR, Est Non African American: 69 mL/min/{1.73_m2} (ref >=60)
Globulin: 2 g/dL (calc) (ref 1.9–3.7)
Glucose, Bld: 69 mg/dL (ref 65–99)
Potassium: 4.6 mmol/L (ref 3.5–5.3)
Sodium: 141 mmol/L (ref 135–146)
Total Bilirubin: 0.7 mg/dL (ref 0.2–1.2)
Total Protein: 6.6 g/dL (ref 6.1–8.1)

## 2019-08-06 LAB — MICROALBUMIN / CREATININE URINE RATIO
Creatinine, Urine: 29 mg/dL (ref 20–320)
Microalb Creat Ratio: 7 mcg/mg creat (ref <30)
Microalb, Ur: 0.2 mg/dL

## 2019-08-06 LAB — CMP (CANCER CENTER ONLY)
ALT: 11 U/L (ref 0–44)
AST: 17 U/L (ref 15–41)
Albumin: 4.5 g/dL (ref 3.5–5.0)
Alkaline Phosphatase: 85 U/L (ref 38–126)
Anion gap: 9 (ref 5–15)
BUN: 14 mg/dL (ref 6–20)
CO2: 29 mmol/L (ref 22–32)
Calcium: 9.9 mg/dL (ref 8.9–10.3)
Chloride: 105 mmol/L (ref 98–111)
Creatinine: 1.27 mg/dL — ABNORMAL HIGH (ref 0.61–1.24)
GFR, Est AFR Am: >60 mL/min (ref >60)
GFR, Estimated: >60 mL/min (ref >60)
Glucose, Bld: 91 mg/dL (ref 70–99)
Potassium: 5 mmol/L (ref 3.5–5.1)
Sodium: 143 mmol/L (ref 135–145)
Total Bilirubin: 0.6 mg/dL (ref 0.3–1.2)
Total Protein: 7 g/dL (ref 6.5–8.1)

## 2019-08-06 LAB — LIPID PANEL
Cholesterol: 173 mg/dL (ref <200)
HDL: 52 mg/dL (ref >=40)
LDL Cholesterol (Calc): 107 mg/dL (calc) — ABNORMAL HIGH
Non-HDL Cholesterol (Calc): 121 mg/dL (calc) (ref <130)
Total CHOL/HDL Ratio: 3.3 (calc) (ref <5.0)
Triglycerides: 59 mg/dL (ref <150)

## 2019-08-06 LAB — VITAMIN D 25 HYDROXY (VIT D DEFICIENCY, FRACTURES): Vit D, 25-Hydroxy: 66 ng/mL (ref 30–100)

## 2019-08-06 LAB — IRON AND TIBC
Iron: 78 ug/dL (ref 42–163)
Saturation Ratios: 23 % (ref 20–55)
TIBC: 333 ug/dL (ref 202–409)
UIBC: 255 ug/dL (ref 117–376)

## 2019-08-06 LAB — URINALYSIS, ROUTINE W REFLEX MICROSCOPIC
Bilirubin Urine: NEGATIVE
Glucose, UA: NEGATIVE
Hgb urine dipstick: NEGATIVE
Ketones, ur: NEGATIVE
Leukocytes,Ua: NEGATIVE
Nitrite: NEGATIVE
Protein, ur: NEGATIVE
Specific Gravity, Urine: 1.005 (ref 1.001–1.03)
pH: 6.5 (ref 5.0–8.0)

## 2019-08-06 LAB — IRON, TOTAL/TOTAL IRON BINDING CAP
%SAT: 26 % (calc) (ref 20–48)
Iron: 86 ug/dL (ref 50–180)
TIBC: 331 mcg/dL (calc) (ref 250–425)

## 2019-08-06 LAB — TSH: TSH: 1.19 mIU/L (ref 0.40–4.50)

## 2019-08-06 LAB — FERRITIN: Ferritin: 137 ng/mL (ref 24–336)

## 2019-08-06 LAB — TESTOSTERONE: Testosterone: 606 ng/dL (ref 250–827)

## 2019-08-06 LAB — VITAMIN B12: Vitamin B-12: 617 pg/mL (ref 200–1100)

## 2019-08-06 LAB — LACTATE DEHYDROGENASE: LDH: 201 U/L — ABNORMAL HIGH (ref 98–192)

## 2019-08-06 LAB — MAGNESIUM: Magnesium: 2.2 mg/dL (ref 1.5–2.5)

## 2019-08-06 LAB — PSA: PSA: 0.8 ng/mL (ref <=4.0)

## 2019-08-06 LAB — INSULIN, RANDOM: Insulin: <1 u[IU]/mL

## 2019-08-06 MED ORDER — IMBRUVICA 280 MG PO TABS: 280.0000 mg | ORAL_TABLET | Freq: Every day | ORAL | 6 refills | Status: DC

## 2019-08-06 NOTE — Progress Notes (Signed)
Hematology and Oncology Follow Up Visit  Edwin Webster [id number] Nov 23, 1961 58 y.o. 08/06/2019   Principle Diagnosis:  Waldenstrom's macroglobulinemia Iron deficiency anemia  Current Therapy:   Rituxan/bendamustine-s/p cycle 2 -- d/c on 06/11/2018 Imbruvica  210 mg po q day -- changed on 08/06/2019 IV iron as indicated - last received in April 2019   Interim History:  Edwin Webster is here today for follow-up.  Everything seems to be doing pretty well with him.  He had a pretty good summer.  He and his family really did not go anywhere.  He is still been very cautious with the coronavirus..  By then we can probably cut his Imbruvica dose back now.  We last saw him in early June, his M spike was 0.3 g/dL.  His IgM level was 420 mg/dL.  He just feels tired all the time.  This might be from the [place].  Hopefully, decreasing his dose to 210 mg daily will be helpful for him and will allow him to have less fatigue.  He has had no problems with nausea or vomiting.  He has had no change in bowel or bladder habits.  He has had no arthralgias.  Overall, his performance status is ECOG 1.     Medications:  Allergies as of 08/06/2019      Reactions   Adalimumab Other (See Comments)   "blacked out", confused "blacked out"      Medication List       Accurate as of August 06, 2019  8:10 AM. If you have any questions, ask your nurse or doctor.        ALPRAZolam 1 MG tablet Commonly known as: Xanax Take 1/2 to 1 tablet 2 to 3 x /day ONLY if needed for Anxiety Attack(s)   aspirin EC 81 MG tablet Take 81 mg daily by mouth.   atenolol 100 MG tablet Commonly known as: Tenormin Take 1 tablet every evening for BP   buPROPion 300 MG 24 hr tablet Commonly known as: WELLBUTRIN XL TAKE 1 TABLET EVERY MORNING FOR MOOD   busPIRone 5 MG tablet Commonly known as: BUSPAR Take 1/2 to 1 tablet 3 x /day for Anxiety   escitalopram 20 MG tablet Commonly known as: LEXAPRO TAKE 1 TABLET BY  MOUTH EVERY DAY   famciclovir 500 MG tablet Commonly known as: FAMVIR Take 1 tablet by mouth daily.   famotidine 40 MG tablet Commonly known as: PEPCID Take 1 tabet Daily for Heartburn and Indigestion   fluticasone 50 MCG/ACT nasal spray Commonly known as: FLONASE Instill 2 sprays each Nares 2 x /day   folic acid 1 MG tablet Commonly known as: FOLVITE Take 1 mg by mouth daily.   gabapentin 300 MG capsule Commonly known as: NEURONTIN TAKE 1 CAPSULE (300 MG TOTAL) BY MOUTH 3 (THREE) TIMES DAILY AS NEEDED. FOR SHINGLES PAIN   ibuprofen 800 MG tablet Commonly known as: Motrin IB Take 1 tablet (800 mg total) by mouth daily as needed.   Imbruvica 420 MG Tabs Generic drug: Ibrutinib TAKE 1 TABLET BY MOUTH DAILY   levocetirizine 5 MG tablet Commonly known as: XYZAL TAKE 1 TABLET BY MOUTH EVERY DAY IN THE EVENING   losartan 100 MG tablet Commonly known as: COZAAR Take 1 tablet at Bedtime for BP   rosuvastatin 40 MG tablet Commonly known as: CRESTOR Take 1 tablet Daily for Cholesterol   triamcinolone ointment 0.1 % Commonly known as: KENALOG Apply 1 application topically 2 (two) times daily.   vitamin  B-12 500 MCG tablet Commonly known as: CYANOCOBALAMIN Take 500 mcg by mouth daily.   Vitamin D-3 125 MCG (5000 UT) Tabs Take 10,000 Units by mouth daily.       Allergies:  Allergies  Allergen Reactions  . Adalimumab Other (See Comments)    "blacked out", confused "blacked out"    Past Medical History, Surgical history, Social history, and Family History were reviewed and updated.  Review of Systems: Review of Systems  Constitutional: Negative.   HENT: Negative.   Eyes: Negative.   Respiratory: Negative.   Cardiovascular: Negative.   Gastrointestinal: Negative.   Genitourinary: Negative.   Musculoskeletal: Negative.   Skin: Negative.   Neurological: Negative.   Endo/Heme/Allergies: Negative.   Psychiatric/Behavioral: Negative.       Physical Exam:   vitals were not taken for this visit.   Wt Readings from Last 3 Encounters:  08/05/19 173 lb 6.4 oz (78.7 kg)  06/08/19 171 lb (77.6 kg)  05/07/19 173 lb 9.6 oz (78.7 kg)    Physical Exam Vitals signs reviewed.  HENT:     Head: Normocephalic and atraumatic.  Eyes:     Pupils: Pupils are equal, round, and reactive to light.  Neck:     Musculoskeletal: Normal range of motion.  Cardiovascular:     Rate and Rhythm: Normal rate and regular rhythm.     Heart sounds: Normal heart sounds.  Pulmonary:     Effort: Pulmonary effort is normal.     Breath sounds: Normal breath sounds.  Abdominal:     General: Bowel sounds are normal.     Palpations: Abdomen is soft.  Musculoskeletal: Normal range of motion.        General: No tenderness or deformity.  Lymphadenopathy:     Cervical: No cervical adenopathy.  Skin:    General: Skin is warm and dry.     Findings: No erythema or rash.  Neurological:     Mental Status: He is alert and oriented to person, place, and time.  Psychiatric:        Behavior: Behavior normal.        Thought Content: Thought content normal.        Judgment: Judgment normal.      Lab Results  Component Value Date   WBC 5.2 08/06/2019   HGB 14.5 08/06/2019   HCT 43.8 08/06/2019   MCV 97.1 08/06/2019   PLT 250 08/06/2019   Lab Results  Component Value Date   FERRITIN 556 (H) 07/09/2018   IRON 86 08/05/2019   TIBC 331 08/05/2019   UIBC 164 07/09/2018   IRONPCTSAT 26 08/05/2019   Lab Results  Component Value Date   RETICCTPCT 0.9 02/12/2018   RBC 4.51 08/06/2019   RETICCTABS 33,120 02/12/2018   Lab Results  Component Value Date   KPAFRELGTCHN 26.2 (H) 04/27/2019   LAMBDASER 11.6 04/27/2019   KAPLAMBRATIO 2.26 (H) 04/27/2019   Lab Results  Component Value Date   IGGSERUM 336 (L) 04/27/2019   IGGSERUM 364 (L) 04/27/2019   IGA 26 (L) 04/27/2019   IGA 27 (L) 04/27/2019   IGMSERUM 417 (H) 04/27/2019   IGMSERUM 447 (H) 04/27/2019   Lab  Results  Component Value Date   TOTALPROTELP 6.0 04/27/2019   ALBUMINELP 3.4 04/27/2019   A1GS 0.3 04/27/2019   A2GS 0.7 04/27/2019   BETS 0.9 04/27/2019   BETA2SER 0.2 02/18/2018   GAMS 0.7 04/27/2019   MSPIKE 0.3 (H) 04/27/2019   SPEI Comment 07/09/2018  Chemistry      Component Value Date/Time   NA 141 08/05/2019 1122   K 4.6 08/05/2019 1122   CL 105 08/05/2019 1122   CO2 29 08/05/2019 1122   BUN 14 08/05/2019 1122   CREATININE 1.15 08/05/2019 1122      Component Value Date/Time   CALCIUM 10.0 08/05/2019 1122   ALKPHOS 68 04/27/2019 0855   AST 17 08/05/2019 1122   AST 15 04/27/2019 0855   ALT 10 08/05/2019 1122   ALT 10 04/27/2019 0855   BILITOT 0.7 08/05/2019 1122   BILITOT 0.6 04/27/2019 0855      Impression and Plan: Mr. Danbury is a very pleasant 58 yo caucasian gentleman with Waldenstrom's macroglobulinemia.   Again, we will decrease his dose of Imbruvica to 210 mg daily.  I will try to get him back after all the holidays.  We will try to get him through the rest of this year without having to see him.  A lot will depend on his Waldenstrm's studies.   [name], MD 9/10/20208:10 AM

## 2019-08-06 NOTE — Telephone Encounter (Signed)
Patient notified per order of Dr. Ennever that "the iron level is ok!!"  Patient appreciative of call and has no questions or concerns at this time.   

## 2019-08-06 NOTE — Telephone Encounter (Signed)
-----   Message from [name], MD sent at 08/06/2019  1:40 PM EDT ----- Call - iron level is ok!!  Laurey Arrow

## 2019-08-07 ENCOUNTER — Encounter: Payer: Self-pay | Admitting: Internal Medicine

## 2019-08-07 LAB — IGG, IGA, IGM
IgA: 36 mg/dL — ABNORMAL LOW (ref 90–386)
IgG (Immunoglobin G), Serum: 424 mg/dL — ABNORMAL LOW (ref 603–1613)
IgM (Immunoglobulin M), Srm: 585 mg/dL — ABNORMAL HIGH (ref 20–172)

## 2019-08-07 LAB — KAPPA/LAMBDA LIGHT CHAINS
Kappa free light chain: 29.8 mg/L — ABNORMAL HIGH (ref 3.3–19.4)
Kappa, lambda light chain ratio: 2.03 — ABNORMAL HIGH (ref 0.26–1.65)
Lambda free light chains: 14.7 mg/L (ref 5.7–26.3)

## 2019-08-14 ENCOUNTER — Other Ambulatory Visit: Payer: Self-pay | Admitting: Physician Assistant

## 2019-08-14 DIAGNOSIS — F331 Major depressive disorder, recurrent, moderate: Secondary | ICD-10-CM

## 2019-08-14 LAB — PROTEIN ELECTROPHORESIS, SERUM, WITH REFLEX
A/G Ratio: 1.6 (ref 0.7–1.7)
Albumin ELP: 4.2 g/dL (ref 2.9–4.4)
Alpha-1-Globulin: 0.3 g/dL (ref 0.0–0.4)
Alpha-2-Globulin: 0.9 g/dL (ref 0.4–1.0)
Beta Globulin: 0.8 g/dL (ref 0.7–1.3)
Gamma Globulin: 0.7 g/dL (ref 0.4–1.8)
Globulin, Total: 2.7 g/dL (ref 2.2–3.9)
M-Spike, %: 0.3 g/dL — ABNORMAL HIGH
SPEP Interpretation: 0
Total Protein ELP: 6.9 g/dL (ref 6.0–8.5)

## 2019-08-14 LAB — IMMUNOFIXATION REFLEX, SERUM
IgA: 34 mg/dL — ABNORMAL LOW (ref 90–386)
IgG (Immunoglobin G), Serum: 433 mg/dL — ABNORMAL LOW (ref 603–1613)
IgM (Immunoglobulin M), Srm: 614 mg/dL — ABNORMAL HIGH (ref 20–172)

## 2019-08-24 ENCOUNTER — Ambulatory Visit: Payer: 59 | Admitting: Physician Assistant

## 2019-08-24 ENCOUNTER — Other Ambulatory Visit: Payer: Self-pay

## 2019-08-24 ENCOUNTER — Encounter: Payer: Self-pay | Admitting: Physician Assistant

## 2019-08-24 VITALS — BP 128/76 | HR 65 | Temp 98.1°F | Wt 175.0 lb

## 2019-08-24 DIAGNOSIS — J0100 Acute maxillary sinusitis, unspecified: Secondary | ICD-10-CM | POA: Diagnosis not present

## 2019-08-24 MED ORDER — AZITHROMYCIN 250 MG PO TABS: ORAL_TABLET | ORAL | 1 refills | Status: DC

## 2019-08-24 MED ORDER — PREDNISONE 20 MG PO TABS: ORAL_TABLET | ORAL | 0 refills | Status: DC

## 2019-08-24 NOTE — Progress Notes (Signed)
Subjective:    Patient ID: Edwin Webster, male    DOB: [birth year], 58 y.o.   MRN: [id number]  HPI 58 y.o. WM presents with 1.5-2 weeks symptoms sinus infection symptoms. Has green mucus, teeth pain, sinus pressure maxillary and frontal. Had sweating last night but no objective fever.  Mild cough with drainage.  No sore throat.  No SOB, CP.  No COVID exposure per patient.  Has not been tested for COVID.  Blood pressure 128/76, pulse 65, temperature 98.1 F (36.7 C), weight 175 lb (79.4 kg), SpO2 97 %.  Medications Current Outpatient Medications on File Prior to Visit  Medication Sig  . ALPRAZolam (XANAX) 1 MG tablet Take 1/2 to 1 tablet 2 to 3 x /day ONLY if needed for Anxiety Attack(s)  . aspirin EC 81 MG tablet Take 81 mg daily by mouth.  Marland Kitchen atenolol (TENORMIN) 100 MG tablet Take 1 tablet every evening for BP  . buPROPion (WELLBUTRIN XL) 300 MG 24 hr tablet TAKE 1 TABLET EVERY MORNING FOR MOOD  . busPIRone (BUSPAR) 5 MG tablet Take 1/2 to 1 tablet 3 x /day for Anxiety  . Cholecalciferol (VITAMIN D-3) 5000 UNITS TABS Take 10,000 Units by mouth daily.   Marland Kitchen escitalopram (LEXAPRO) 20 MG tablet TAKE 1 TABLET BY MOUTH EVERY DAY  . famciclovir (FAMVIR) 500 MG tablet Take 1 tablet by mouth daily.  . famotidine (PEPCID) 40 MG tablet Take 1 tabet Daily for Heartburn and Indigestion  . fluticasone (FLONASE) 50 MCG/ACT nasal spray Instill 2 sprays each Nares 2 x /day  . folic acid (FOLVITE) 1 MG tablet Take 1 mg by mouth daily.  Marland Kitchen gabapentin (NEURONTIN) 300 MG capsule TAKE 1 CAPSULE (300 MG TOTAL) BY MOUTH 3 (THREE) TIMES DAILY AS NEEDED. FOR SHINGLES PAIN  . Ibrutinib (IMBRUVICA) 280 MG TABS Take 280 mg by mouth daily.  Marland Kitchen ibuprofen (ADVIL,MOTRIN) 800 MG tablet Take 1 tablet (800 mg total) by mouth daily as needed.  Marland Kitchen levocetirizine (XYZAL) 5 MG tablet TAKE 1 TABLET BY MOUTH EVERY DAY IN THE EVENING  . losartan (COZAAR) 100 MG tablet Take 1 tablet at Bedtime for BP  . rosuvastatin (CRESTOR) 40  MG tablet Take 1 tablet Daily for Cholesterol  . triamcinolone ointment (KENALOG) 0.1 % Apply 1 application topically 2 (two) times daily.  . vitamin B-12 (CYANOCOBALAMIN) 500 MCG tablet Take 500 mcg by mouth daily.  . [DISCONTINUED] prochlorperazine (COMPAZINE) 10 MG tablet Take 1 tablet (10 mg total) by mouth every 6 (six) hours as needed (Nausea or vomiting).   No current facility-administered medications on file prior to visit.     Problem list He has Hyperlipidemia; Hypertension; Vitamin D deficiency; Medication management; Seronegative rheumatoid arthritis (Edwin Webster); Abnormal glucose; BMI 24.0-24.9, adult; Gonalgia; Ventral hernia; Iron deficiency anemia; Waldenstrom's macroglobulinemia ([place]); and Major depressive disorder, recurrent episode, mild with anxious distress (Edwin Webster) on their problem list.   Review of Systems  Constitutional: Negative for chills, diaphoresis and fever.  HENT: Positive for congestion, sinus pressure and sneezing. Negative for ear pain, sore throat, trouble swallowing and voice change.   Eyes: Negative.   Respiratory: Positive for cough. Negative for chest tightness, shortness of breath and wheezing.   Cardiovascular: Negative.   Gastrointestinal: Negative.   Genitourinary: Negative.   Musculoskeletal: Negative for neck pain.  Neurological: Negative.  Negative for headaches.       Objective:   Physical Exam Constitutional:      Appearance: He is well-developed.  HENT:  Head: Normocephalic and atraumatic.     Right Ear: External ear normal.     Left Ear: External ear normal.     Nose:     Right Sinus: Maxillary sinus tenderness present.     Left Sinus: Maxillary sinus tenderness present.  Eyes:     Conjunctiva/sclera: Conjunctivae normal.     Pupils: Pupils are equal, round, and reactive to light.  Neck:     Musculoskeletal: Normal range of motion and neck supple.  Cardiovascular:     Rate and Rhythm: Normal rate and regular rhythm.     Heart  sounds: Normal heart sounds.  Pulmonary:     Effort: Pulmonary effort is normal.     Breath sounds: Normal breath sounds. No wheezing.  Abdominal:     General: Bowel sounds are normal.     Palpations: Abdomen is soft.  Lymphadenopathy:     Cervical: No cervical adenopathy.  Skin:    General: Skin is warm and dry.           Assessment & Plan:   Acute non-recurrent maxillary sinusitis -     azithromycin (ZITHROMAX) 250 MG tablet; Take 2 tablets (500 mg) on  Day 1,  followed by 1 tablet (250 mg) once daily on Days 2 through 5. -     predniSONE (DELTASONE) 20 MG tablet; 2 tablets daily for 3 days, 1 tablet daily for 4 days. Dicussed getting tested for COVID and quarantine Discussed the importance of avoiding unnecessary antibiotic therapy. Suggested symptomatic OTC remedies. Nasal saline spray for congestion. Nasal steroids, allergy pill, oral steroids offered  Follow up as needed.

## 2019-08-24 NOTE — Patient Instructions (Addendum)
HOW TO TREAT VIRAL COUGH AND COLD SYMPTOMS:  -Symptoms usually last at least 1 week with the worst symptoms being around day 4.  - colds usually start with a sore throat and end with a cough, and the cough can take 2 weeks to get better.  -No antibiotics are needed for colds, flu, sore throats, cough, bronchitis UNLESS symptoms are longer than 7 days OR if you are getting better then get drastically worse.  -There are a lot of combination medications (Dayquil, Nyquil, Vicks 44, tyelnol cold and sinus, ETC). Please look at the ingredients on the back so that you are treating the correct symptoms and not doubling up on medications/ingredients.    Medicines you can use  Nasal congestion  Little Remedies saline spray (aerosol/mist)- can try this, it is in the kids section - pseudoephedrine (Sudafed)- behind the counter, do not use if you have high blood pressure, medicine that have -D in them.  - phenylephrine (Sudafed PE) -Dextormethorphan + chlorpheniramine (Coridcidin HBP)- okay if you have high blood pressure -Oxymetazoline (Afrin) nasal spray- LIMIT to 3 days -Saline nasal spray -Neti pot (used distilled or bottled water)  Ear pain/congestion  -pseudoephedrine (sudafed) - Nasonex/flonase nasal spray  Fever  -Acetaminophen (Tyelnol) -Ibuprofen (Advil, motrin, aleve)  Sore Throat  -Acetaminophen (Tyelnol) -Ibuprofen (Advil, motrin, aleve) -Drink a lot of water -Gargle with salt water - Rest your voice (don't talk) -Throat sprays -Cough drops  Body Aches  -Acetaminophen (Tyelnol) -Ibuprofen (Advil, motrin, aleve)  Headache  -Acetaminophen (Tyelnol) -Ibuprofen (Advil, motrin, aleve) - Exedrin, Exedrin Migraine  Allergy symptoms (cough, sneeze, runny nose, itchy eyes) -Claritin or loratadine cheapest but likely the weakest  -Zyrtec or certizine at night because it can make you sleepy -The strongest is allegra or fexafinadine  Cheapest at [facility], [employer],  [employer]  Cough  -Dextromethorphan (Delsym)- medicine that has DM in it -Guafenesin (Mucinex/Robitussin) - cough drops - drink lots of water  Chest Congestion  -Guafenesin (Mucinex/Robitussin)  Red Itchy Eyes  - Naphcon-A  Upset Stomach  - Bland diet (nothing spicy, greasy, fried, and high acid foods like tomatoes, oranges, berries) -OKAY- cereal, bread, soup, crackers, rice -Eat smaller more frequent meals -reduce caffeine, no alcohol -Loperamide (Imodium-AD) if diarrhea -Prevacid for heart burn  General health when sick  -Hydration -wash your hands frequently -keep surfaces clean -change pillow cases and sheets often -Get fresh air but do not exercise strenuously -Vitamin D, double up on it - Vitamin C -Zinc   The testing sites are open from 8-4, Monday-Friday. Due to the testing being walk-up/drive-up the sites request that the pt's are in line to have testing by 3:30 pm. The pt's will remain in the car and wear a mask when going for testing.The staff will come to the car to perform testing. The pt's will need to bring an ID and insurance card if they have one.   [place] ([place])  [facility]   The testing sites are also listed on the [employer] as well.

## 2019-08-31 ENCOUNTER — Other Ambulatory Visit: Payer: Self-pay | Admitting: Hematology & Oncology

## 2019-09-06 NOTE — Progress Notes (Signed)
     Subjective:    Patient ID: Edwin Webster, male    DOB: 1961-06-29, 58 y.o.   MRN: [id number]  HPI    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       He presents today with c/o of  Recurrent ulcerating lesion of the rt distal nasal bridge which has been bleeding lately.    Review of Systems   10 point systems review negative except as above.    Objective:   Physical Exam  BP 128/80   Pulse 60   Temp 97.6 F (36.4 C)   Resp 16   Ht 5' 9.5" (1.765 m)   Wt 176 lb (79.8 kg)   BMI 25.62 kg/m   Focused exam of nose finds a 15 mm x 18 mm pink raised lesion with central ulceration of the distal Right nasal bridge.     Assessment & Plan:    1. Primary cancer of skin of nose  - Ambulatory referral to Dermatology

## 2019-09-07 ENCOUNTER — Ambulatory Visit: Payer: 59 | Admitting: Internal Medicine

## 2019-09-07 ENCOUNTER — Encounter: Payer: Self-pay | Admitting: Internal Medicine

## 2019-09-07 ENCOUNTER — Other Ambulatory Visit: Payer: Self-pay

## 2019-09-07 VITALS — BP 128/80 | HR 60 | Temp 97.6°F | Resp 16 | Ht 69.5 in | Wt 176.0 lb

## 2019-09-07 DIAGNOSIS — C44301 Unspecified malignant neoplasm of skin of nose: Secondary | ICD-10-CM

## 2019-09-08 ENCOUNTER — Other Ambulatory Visit: Payer: Self-pay | Admitting: Pharmacist

## 2019-09-08 ENCOUNTER — Other Ambulatory Visit: Payer: Self-pay | Admitting: *Deleted

## 2019-09-08 DIAGNOSIS — C880 Waldenstrom macroglobulinemia: Secondary | ICD-10-CM

## 2019-09-08 MED ORDER — IMBRUVICA 420 MG PO TABS: 210.0000 mg | ORAL_TABLET | Freq: Every day | ORAL | 3 refills | Status: DC

## 2019-09-08 MED ORDER — IMBRUVICA 280 MG PO TABS: 280.0000 mg | ORAL_TABLET | Freq: Every day | ORAL | 3 refills | Status: DC

## 2019-09-08 MED FILL — IMBRUVICA 280 MG TAB: 280 | 28 days supply | Qty: 28 | Fill #0

## 2019-09-08 NOTE — Progress Notes (Signed)
Oral Chemotherapy Pharmacist Encounter   Prescription sent by provider to local [facility] instead of [facility] where Mr. Bateson fills his Imbruvica. Prescription redirected to [facility].  Darl Pikes, PharmD, BCPS, [facility] Hematology/Oncology Clinical Pharmacist [facility] [phone]  09/08/2019 10:35 AM

## 2019-09-08 NOTE — Telephone Encounter (Signed)
Call from [place] at [facility], pt last seen 9/10 a new updated rx for this medication is needed. Per MD progress note pt to take Imbruvica 210mg  po q day. New rx updated and sent to [facility].

## 2019-09-24 ENCOUNTER — Other Ambulatory Visit: Payer: Self-pay | Admitting: Adult Health

## 2019-09-27 ENCOUNTER — Other Ambulatory Visit: Payer: Self-pay | Admitting: Internal Medicine

## 2019-09-27 DIAGNOSIS — I10 Essential (primary) hypertension: Secondary | ICD-10-CM

## 2019-10-06 MED FILL — IMBRUVICA 280 MG TAB: 280 | 28 days supply | Qty: 28 | Fill #1

## 2019-11-02 NOTE — Progress Notes (Signed)
FOLLOW UP  Assessment and Plan:   Hypertension Fairly controlled with current medications;  Monitor blood pressure at home; patient to call if consistently greater than 130/80 Continue DASH diet.   Reminder to go to the ER if any CP, SOB, nausea, dizziness, severe HA, changes vision/speech, left arm numbness and tingling and jaw pain.  Cholesterol Currently above goal; currently on rosuvastatin 40 mg daily; consider addition of zetia if trending up Continue low cholesterol diet and exercise.  Check lipid panel.   Other abnormal glucose Recent A1Cs at goal Discussed diet/exercise, weight management  Defer A1C; check CMP  BMI 25 Recommended diet heavy in fruits and veggies and low in animal meats, cheeses, and dairy products, appropriate calorie intake Discussed exercise guidelines/recommendations Discussed ideal weight for height  Will follow up in 3 months  Vitamin D Def At goal at recent check; continue to recommend supplementation for goal of 60-100 Defer vitamin D level  Waldenstrom's/anemia Managed by Dr. 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 Further disposition pending results of labs. Discussed med's effects and SE's.   Over 30 minutes of exam, counseling, chart review, and critical decision making was performed.   Future Appointments  Date Time Provider [place]  12/08/2019 10:00 AM [facility] None  12/08/2019 10:30 AM [place], Rudell Cobb, MD [facility] None  02/09/2020 11:30 AM Unk Pinto, MD [facility] None  09/01/2020  2:00 PM Unk Pinto, MD [facility] None     ----------------------------------------------------------------------------------------------------------------------  HPI BP 140/80   Pulse 64   Temp (!) 97.3 F (36.3 C)   Ht 5' 9.5" (1.765 m)   Wt 176 lb 12.8 oz (80.2 kg)   SpO2 96%   BMI 25.73 kg/m   58 y.o. male  Presents accompanied by his wife for 3 month follow up on hypertension, cholesterol, glucose management, weight and vitamin D deficiency.   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 Off of methotrexate, imbruvica has helped some, takes NSAID PRN.   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 Marin Olp, Rituxan/bendamustine-s/pcycle 2, Imbruvica 420 mg po q day and reports significant improvement with this, ECOG of 1. He is retired. He has iron deficiency anemia that is also monitored, IV iron as indicated - last received in April 2019.  he has a diagnosis of depression/anxiety and is currently on lexapro 20 mg daily, buspar 5 mg TID, xanax 0.5-1 mg PRN, reports symptoms are not well controlled on current regimen. he has taken xanax only a few times in the past few months.   BMI is Body mass index is 25.73 kg/m., he has been working on diet and exercise, walks daily 1 mile and mows.   Wt Readings from Last 3 Encounters:  11/04/19 176 lb 12.8 oz (80.2 kg)  09/07/19 176 lb (79.8 kg)  08/24/19 175 lb (79.4 kg)   His blood pressure has been controlled at home, today their BP is BP: 140/80  He does not workout. He denies chest pain,  shortness of breath, dizziness.   He is on cholesterol medication (rosuvastatin 40 mg daily) and denies myalgias. His cholesterol is not at goal. The cholesterol last visit was:   Lab Results  Component Value Date   CHOL 173 08/05/2019   HDL 52 08/05/2019    LDLCALC 107 (H) 08/05/2019   TRIG 59 08/05/2019   CHOLHDL 3.3 08/05/2019    He has not been working on diet and exercise for glucose management (intermittent A1Cs in prediabetic range), and denies foot ulcerations, increased appetite, nausea, paresthesia of the feet, polydipsia, polyuria, visual disturbances, vomiting and weight loss. Last A1C in the office was:  Lab Results  Component Value Date   HGBA1C 5.3 08/05/2019   Lab Results  Component Value Date   GFRNONAA >60 08/06/2019   Patient is on Vitamin D supplement and at goal at last check:    Lab Results  Component Value Date   VD25OH 66 08/05/2019        Current Medications:  Current Outpatient Medications on File Prior to Visit  Medication Sig  . ALPRAZolam (XANAX) 1 MG tablet Take 1/2 to 1 tablet 2 to 3 x /day ONLY if needed for Anxiety Attack(s)  . aspirin EC 81 MG tablet Take 81 mg daily by mouth.  Marland Kitchen atenolol (TENORMIN) 100 MG tablet Take 1 tablet every evening for BP  . buPROPion (WELLBUTRIN XL) 300 MG 24 hr tablet TAKE 1 TABLET EVERY MORNING FOR MOOD  . busPIRone (BUSPAR) 5 MG tablet Take 1/2 to 1 tablet 3 x /day for Anxiety  . Cholecalciferol (VITAMIN D-3) 5000 UNITS TABS Take 10,000 Units by mouth daily.   Marland Kitchen escitalopram (LEXAPRO) 20 MG tablet TAKE 1 TABLET BY MOUTH EVERY DAY  . famciclovir (FAMVIR) 500 MG tablet Take 1 tablet by mouth daily.  . famotidine (PEPCID) 40 MG tablet Take 1 tabet Daily for Heartburn and Indigestion  . fluticasone (FLONASE) 50 MCG/ACT nasal spray Instill 2 sprays each Nares 2 x /day  . folic acid (FOLVITE) 1 MG tablet Take 1 mg by mouth daily.  Marland Kitchen gabapentin (NEURONTIN) 300 MG capsule TAKE 1 CAPSULE (300 MG TOTAL) BY MOUTH 3 (THREE) TIMES DAILY AS NEEDED. FOR SHINGLES PAIN  . Ibrutinib (IMBRUVICA) 280 MG TABS Take 280 mg by mouth daily.  Marland Kitchen ibuprofen (ADVIL,MOTRIN) 800 MG tablet Take 1 tablet (800 mg total) by mouth daily as needed.  Marland Kitchen levocetirizine (XYZAL) 5 MG tablet Take 1 tablet Daily  for Allergies  . losartan (COZAAR) 100 MG tablet Take 1 tablet at Bedtime for BP  . rosuvastatin (CRESTOR) 40 MG tablet Take 1 tablet Daily for Cholesterol  . triamcinolone ointment (KENALOG) 0.1 % Apply 1 application topically 2 (two) times daily.  . vitamin B-12 (CYANOCOBALAMIN) 500 MCG tablet Take 500 mcg by mouth daily.  . [DISCONTINUED] prochlorperazine (COMPAZINE) 10 MG tablet Take 1 tablet (10 mg total) by mouth every 6 (six) hours as needed (Nausea or vomiting).   No current facility-administered medications on file prior to visit.      Allergies:  Allergies  Allergen Reactions  . Adalimumab Other (See Comments)    "blacked out", confused "blacked out"     Medical History:  Past Medical History:  Diagnosis Date  . Allergy   . Anemia   . Anxiety   . Arthritis   . Chest pain, non-cardiac   . COPD (chronic obstructive pulmonary disease) ([place])   . Counseling regarding goals of care 03/27/2018  . Depression   . Diverticulitis   .  Diverticulosis   . Fractured pelvis ([place])    fall from ladder  . GERD (gastroesophageal reflux disease)   . Gout   . History of shingles   . Hyperlipidemia   . Internal hemorrhoids   . Iron deficiency anemia 03/13/2018  . Kidney stones   . Other abnormal glucose 10/11/2015  . Rheumatoid arthritis ([place])    hands  . Waldenstrom's macroglobulinemia ([place]) 03/27/2018   Family history- Reviewed and unchanged Social history- Reviewed and unchanged   Review of Systems:  Review of Systems  Constitutional: Positive for malaise/fatigue. Negative for chills, fever and weight loss.  HENT: Negative for hearing loss and tinnitus.   Eyes: Negative for blurred vision and double vision.  Respiratory: Negative for cough, shortness of breath and wheezing.   Cardiovascular: Negative for chest pain, palpitations, orthopnea, claudication and leg swelling.  Gastrointestinal: Negative for abdominal pain, blood in stool, constipation, diarrhea, heartburn,  melena, nausea and vomiting.  Genitourinary: Negative.   Musculoskeletal: Negative for joint pain and myalgias.  Skin: Negative for rash.  Neurological: Negative for dizziness, tingling, sensory change, weakness and headaches.  Endo/Heme/Allergies: Negative for polydipsia.  Psychiatric/Behavioral: Negative.  Negative for depression, memory loss and substance abuse. The patient is not nervous/anxious and does not have insomnia.   All other systems reviewed and are negative.   Physical Exam: BP 140/80   Pulse 64   Temp (!) 97.3 F (36.3 C)   Ht 5' 9.5" (1.765 m)   Wt 176 lb 12.8 oz (80.2 kg)   SpO2 96%   BMI 25.73 kg/m  Wt Readings from Last 3 Encounters:  11/04/19 176 lb 12.8 oz (80.2 kg)  09/07/19 176 lb (79.8 kg)  08/24/19 175 lb (79.4 kg)   General Appearance: Well nourished, in no apparent distress. Eyes: PERRLA, EOMs, conjunctiva no swelling or erythema Sinuses: No Frontal/maxillary tenderness ENT/Mouth: Ext aud canals clear, TMs without erythema, bulging. No erythema, swelling, or exudate on post pharynx.  Tonsils not swollen or erythematous. Hearing normal.  Neck: Supple, thyroid normal.  Respiratory: Respiratory effort normal, BS equal bilaterally without rales, rhonchi, wheezing or stridor.  Cardio: RRR with no MRGs. Brisk peripheral pulses without edema.  Abdomen: Soft, + BS.  Non tender, no guarding, rebound, hernias, masses. Lymphatics: Non tender without lymphadenopathy.  Musculoskeletal: Full ROM, 5/5 strength, Normal gait Skin: Warm, dry without rashes,  ecchymosis. Very small mildly ulcerated area to right side of nose Neuro: Cranial nerves intact. No cerebellar symptoms.  Psych: Awake and oriented X 3, normal affect, Insight and Judgment appropriate.    [name], NP 11:30 AM [facility]

## 2019-11-04 ENCOUNTER — Encounter: Payer: Self-pay | Admitting: Adult Health

## 2019-11-04 ENCOUNTER — Ambulatory Visit: Payer: 59 | Admitting: Adult Health

## 2019-11-04 ENCOUNTER — Other Ambulatory Visit: Payer: Self-pay

## 2019-11-04 VITALS — BP 140/80 | HR 64 | Temp 97.3°F | Ht 69.5 in | Wt 176.8 lb

## 2019-11-04 DIAGNOSIS — E782 Mixed hyperlipidemia: Secondary | ICD-10-CM

## 2019-11-04 DIAGNOSIS — M0600 Rheumatoid arthritis without rheumatoid factor, unspecified site: Secondary | ICD-10-CM | POA: Diagnosis not present

## 2019-11-04 DIAGNOSIS — I10 Essential (primary) hypertension: Secondary | ICD-10-CM

## 2019-11-04 DIAGNOSIS — R7309 Other abnormal glucose: Secondary | ICD-10-CM

## 2019-11-04 DIAGNOSIS — C880 Waldenstrom macroglobulinemia: Secondary | ICD-10-CM

## 2019-11-04 DIAGNOSIS — Z79899 Other long term (current) drug therapy: Secondary | ICD-10-CM

## 2019-11-04 DIAGNOSIS — E559 Vitamin D deficiency, unspecified: Secondary | ICD-10-CM

## 2019-11-04 DIAGNOSIS — F330 Major depressive disorder, recurrent, mild: Secondary | ICD-10-CM

## 2019-11-04 DIAGNOSIS — D500 Iron deficiency anemia secondary to blood loss (chronic): Secondary | ICD-10-CM

## 2019-11-04 DIAGNOSIS — Z6825 Body mass index (BMI) 25.0-25.9, adult: Secondary | ICD-10-CM

## 2019-11-04 MED ORDER — FOLIC ACID 1 MG PO TABS: 1.0000 mg | ORAL_TABLET | Freq: Every day | ORAL | 2 refills | Status: DC

## 2019-11-04 MED ORDER — PREDNISONE 20 MG PO TABS: ORAL_TABLET | ORAL | 0 refills | Status: DC

## 2019-11-04 MED FILL — IMBRUVICA 280 MG TAB: 280 | 28 days supply | Qty: 28 | Fill #2

## 2019-11-04 NOTE — Patient Instructions (Addendum)
Goals    . Blood Pressure < 140/90     If blood pressure is consistently elevated above goal, start losartan     . LDL CALC < 100          Preventing High Cholesterol Cholesterol is a white, waxy substance similar to fat that the human body needs to help build cells. The liver makes all the cholesterol that a person's body needs. Having high cholesterol (hypercholesterolemia) increases a person's risk for heart disease and stroke. Extra (excess) cholesterol comes from the food the person eats. High cholesterol can often be prevented with diet and lifestyle changes. If you already have high cholesterol, you can control it with diet and lifestyle changes and with medicine. How can high cholesterol affect me? If you have high cholesterol, deposits (plaques) may build up on the walls of your arteries. The arteries are the blood vessels that carry blood away from your heart. Plaques make the arteries narrower and stiffer. This can limit or block blood flow and cause blood clots to form. Blood clots:  Are tiny balls of cells that form in your blood.  Can move to the heart or brain, causing a heart attack or stroke. Plaques in arteries greatly increase your risk for heart attack and stroke.Making diet and lifestyle changes can reduce your risk for these conditions that may threaten your life. What can increase my risk? This condition is more likely to develop in people who:  Eat foods that are high in saturated fat or cholesterol. Saturated fat is mostly found in: ? Foods that contain animal fat, such as red meat and some dairy products. ? Certain fatty foods made from plants, such as tropical oils.  Are overweight.  Are not getting enough exercise.  Have a family history of high cholesterol. What actions can I take to prevent this? Nutrition   Eat less saturated fat.  Avoid trans fats (partially hydrogenated oils). These are often found in margarine and in some baked goods, fried  foods, and snacks bought in packages.  Avoid precooked or cured meat, such as sausages or meat loaves.  Avoid foods and drinks that have added sugars.  Eat more fruits, vegetables, and whole grains.  Choose healthy sources of protein, such as fish, poultry, lean cuts of red meat, beans, peas, lentils, and nuts.  Choose healthy sources of fat, such as: ? Nuts. ? Vegetable oils, especially olive oil. ? Fish that have healthy fats (omega-3 fatty acids), such as mackerel or salmon. The items listed above may not be a complete list of recommended foods and beverages. Contact a dietitian for more information. Lifestyle  Lose weight if you are overweight. Losing 5-10 lb (2.3-4.5 kg) can help prevent or control high cholesterol. It can also lower your risk for diabetes and high blood pressure. Ask your health care provider to help you with a diet and exercise plan to lose weight safely.  Do not use any products that contain nicotine or tobacco, such as cigarettes, e-cigarettes, and chewing tobacco. If you need help quitting, ask your health care provider.  Limit your alcohol intake. ? Do not drink alcohol if:  Your health care provider tells you not to drink.  You are pregnant, may be pregnant, or are planning to become pregnant. ? If you drink alcohol:  Limit how much you use to:  0-1 drink a day for women.  0-2 drinks a day for men.  Be aware of how much alcohol is in your drink. In  the [place] one drink equals one 12 oz bottle of beer (355 mL), one 5 oz glass of wine (148 mL), or one 1 oz glass of hard liquor (44 mL). Activity   Get enough exercise. Each week, do at least 150 minutes of exercise that takes a medium level of effort (moderate-intensity exercise). ? This is exercise that:  Makes your heart beat faster and makes you breathe harder than usual.  Allows you to still be able to talk. ? You could exercise in short sessions several times a day or longer sessions a few  times a week. For example, on 5 days each week, you could walk fast or ride your bike 3 times a day for 10 minutes each time.  Do exercises as told by your health care provider. Medicines  In addition to diet and lifestyle changes, your health care provider may recommend medicines to help lower cholesterol. This may be a medicine to lower the amount of cholesterol your liver makes. You may need medicine if: ? Diet and lifestyle changes do not lower your cholesterol enough. ? You have high cholesterol and other risk factors for heart disease or stroke.  Take over-the-counter and prescription medicines only as told by your health care provider. General information  Manage your risk factors for high cholesterol. Talk with your health care provider about all your risk factors and how to lower your risk.  Manage other conditions that you have, such as diabetes or high blood pressure (hypertension).  Have blood tests to check your cholesterol levels at regular points in time as told by your health care provider.  Keep all follow-up visits as told by your health care provider. This is important. Where to find more information  American Heart Association: www.heart.org  National Heart, Lung, and Blood Institute: https://wilson-eaton.com/ Summary  High cholesterol increases your risk for heart disease and stroke. By keeping your cholesterol level low, you can reduce your risk for these conditions.  High cholesterol can often be prevented with diet and lifestyle changes.  Work with your health care provider to manage your risk factors, and have your blood tested regularly. This information is not intended to replace advice given to you by your health care provider. Make sure you discuss any questions you have with your health care provider. Document Released: 11/27/2015 Document Revised: 03/06/2019 Document Reviewed: 07/21/2016 Elsevier Patient Education  2020 [employer].

## 2019-11-05 LAB — TSH: TSH: 2.98 mIU/L (ref 0.40–4.50)

## 2019-11-05 LAB — CBC WITH DIFFERENTIAL/PLATELET
Absolute Monocytes: 699 cells/uL (ref 200–950)
Basophils Absolute: 39 cells/uL (ref 0–200)
Basophils Relative: 0.7 %
Eosinophils Absolute: 110 cells/uL (ref 15–500)
Eosinophils Relative: 2 %
HCT: 40.1 % (ref 38.5–50.0)
Hemoglobin: 13.5 g/dL (ref 13.2–17.1)
Lymphs Abs: 979 cells/uL (ref 850–3900)
MCH: 32.2 pg (ref 27.0–33.0)
MCHC: 33.7 g/dL (ref 32.0–36.0)
MCV: 95.7 fL (ref 80.0–100.0)
MPV: 11.5 fL (ref 7.5–12.5)
Monocytes Relative: 12.7 %
Neutro Abs: 3674 cells/uL (ref 1500–7800)
Neutrophils Relative %: 66.8 %
Platelets: 222 10*3/uL (ref 140–400)
RBC: 4.19 10*6/uL — ABNORMAL LOW (ref 4.20–5.80)
RDW: 13.9 % (ref 11.0–15.0)
Total Lymphocyte: 17.8 %
WBC: 5.5 10*3/uL (ref 3.8–10.8)

## 2019-11-05 LAB — COMPLETE METABOLIC PANEL WITH GFR
AG Ratio: 2.1 (calc) (ref 1.0–2.5)
ALT: 29 U/L (ref 9–46)
AST: 29 U/L (ref 10–35)
Albumin: 4.4 g/dL (ref 3.6–5.1)
Alkaline phosphatase (APISO): 91 U/L (ref 35–144)
BUN: 11 mg/dL (ref 7–25)
CO2: 28 mmol/L (ref 20–32)
Calcium: 9.4 mg/dL (ref 8.6–10.3)
Chloride: 105 mmol/L (ref 98–110)
Creat: 1.24 mg/dL (ref 0.70–1.25)
GFR, Est African American: 73 mL/min/{1.73_m2} (ref >=60)
GFR, Est Non African American: 63 mL/min/{1.73_m2} (ref >=60)
Globulin: 2.1 g/dL (calc) (ref 1.9–3.7)
Glucose, Bld: 78 mg/dL (ref 65–99)
Potassium: 4.5 mmol/L (ref 3.5–5.3)
Sodium: 143 mmol/L (ref 135–146)
Total Bilirubin: 0.8 mg/dL (ref 0.2–1.2)
Total Protein: 6.5 g/dL (ref 6.1–8.1)

## 2019-11-05 LAB — LIPID PANEL
Cholesterol: 132 mg/dL (ref <200)
HDL: 47 mg/dL (ref >=40)
LDL Cholesterol (Calc): 71 mg/dL (calc)
Non-HDL Cholesterol (Calc): 85 mg/dL (calc) (ref <130)
Total CHOL/HDL Ratio: 2.8 (calc) (ref <5.0)
Triglycerides: 61 mg/dL (ref <150)

## 2019-11-05 LAB — MAGNESIUM: Magnesium: 2.1 mg/dL (ref 1.5–2.5)

## 2019-11-10 ENCOUNTER — Other Ambulatory Visit: Payer: Self-pay | Admitting: Adult Health

## 2019-11-30 ENCOUNTER — Telehealth: Payer: Self-pay | Admitting: Pharmacy Technician

## 2019-11-30 NOTE — Telephone Encounter (Signed)
Oral Oncology Patient Advocate Encounter   Was successful in obtaining a copay card for [name].  This copay card will make the patients copay $10.00.  I have spoken with the patient.    The billing information is as follows and has been shared with [place].   RxBin: [id number] PCN: [id number] Member ID: [id number] Group ID: [id number]   Edwin Webster [place] Patient [place] Phone [phone] Fax [phone] 11/30/2019 10:13 AM

## 2019-12-03 MED FILL — IMBRUVICA 280 MG TAB: 280 | 28 days supply | Qty: 28 | Fill #3

## 2019-12-08 ENCOUNTER — Encounter: Payer: Self-pay | Admitting: Hematology & Oncology

## 2019-12-08 ENCOUNTER — Inpatient Hospital Stay: Payer: 59

## 2019-12-08 ENCOUNTER — Inpatient Hospital Stay: Payer: 59 | Attending: Hematology & Oncology | Admitting: Hematology & Oncology

## 2019-12-08 ENCOUNTER — Other Ambulatory Visit: Payer: Self-pay

## 2019-12-08 VITALS — BP 151/80 | HR 59 | Temp 97.1°F | Resp 18 | Wt 183.2 lb

## 2019-12-08 DIAGNOSIS — D509 Iron deficiency anemia, unspecified: Secondary | ICD-10-CM | POA: Insufficient documentation

## 2019-12-08 DIAGNOSIS — D500 Iron deficiency anemia secondary to blood loss (chronic): Secondary | ICD-10-CM

## 2019-12-08 DIAGNOSIS — C880 Waldenstrom macroglobulinemia: Secondary | ICD-10-CM | POA: Insufficient documentation

## 2019-12-08 LAB — CMP (CANCER CENTER ONLY)
ALT: 25 U/L (ref 0–44)
AST: 25 U/L (ref 15–41)
Albumin: 4.7 g/dL (ref 3.5–5.0)
Alkaline Phosphatase: 84 U/L (ref 38–126)
Anion gap: 7 (ref 5–15)
BUN: 14 mg/dL (ref 6–20)
CO2: 28 mmol/L (ref 22–32)
Calcium: 9.7 mg/dL (ref 8.9–10.3)
Chloride: 104 mmol/L (ref 98–111)
Creatinine: 1.24 mg/dL (ref 0.61–1.24)
GFR, Est AFR Am: >60 mL/min (ref >60)
GFR, Estimated: >60 mL/min (ref >60)
Glucose, Bld: 88 mg/dL (ref 70–99)
Potassium: 3.9 mmol/L (ref 3.5–5.1)
Sodium: 139 mmol/L (ref 135–145)
Total Bilirubin: 0.7 mg/dL (ref 0.3–1.2)
Total Protein: 6.6 g/dL (ref 6.5–8.1)

## 2019-12-08 LAB — CBC WITH DIFFERENTIAL (CANCER CENTER ONLY)
Abs Immature Granulocytes: 0.04 10*3/uL (ref 0.00–0.07)
Basophils Absolute: 0 10*3/uL (ref 0.0–0.1)
Basophils Relative: 1 %
Eosinophils Absolute: 0.1 10*3/uL (ref 0.0–0.5)
Eosinophils Relative: 2 %
HCT: 40.1 % (ref 39.0–52.0)
Hemoglobin: 13.4 g/dL (ref 13.0–17.0)
Immature Granulocytes: 1 %
Lymphocytes Relative: 20 %
Lymphs Abs: 0.9 10*3/uL (ref 0.7–4.0)
MCH: 32 pg (ref 26.0–34.0)
MCHC: 33.4 g/dL (ref 30.0–36.0)
MCV: 95.7 fL (ref 80.0–100.0)
Monocytes Absolute: 0.6 10*3/uL (ref 0.1–1.0)
Monocytes Relative: 14 %
Neutro Abs: 2.9 10*3/uL (ref 1.7–7.7)
Neutrophils Relative %: 62 %
Platelet Count: 247 10*3/uL (ref 150–400)
RBC: 4.19 MIL/uL — ABNORMAL LOW (ref 4.22–5.81)
RDW: 13.4 % (ref 11.5–15.5)
WBC Count: 4.7 10*3/uL (ref 4.0–10.5)
nRBC: 0 % (ref 0.0–0.2)

## 2019-12-08 NOTE — Progress Notes (Signed)
Hematology and Oncology Follow Up Visit  KADESH Webster Edwin Webster:[id number] 1961/07/27 59 y.o. 12/08/2019   Principle Diagnosis:  Waldenstrom's macroglobulinemia Iron deficiency anemia  Current Therapy:   Rituxan/bendamustine-s/p cycle 2 -- d/c on 06/11/2018 Imbruvica  210 mg po q day -- changed on 08/06/2019 IV iron as indicated - last received in April 2019   Interim History:  [name] is here today for follow-up.  We last saw him in September 2020.  Since then, he has been doing pretty well.  He had no problems over the holiday season.  He still feels somewhat tired.  He was told that this is from the [place].  I think that this certainly would be the case.  He is just on 210 mg of Imbruvica daily.  His last M spike was still stable at 0.3 g/dL.  His IgM level was 600 mg/dL.  This is up a little bit more.  We are going to have to watch this closely.  He has had no problem with infections.  He has had no problems with bowels or bladder.  He has had no cough or shortness of breath.  His iron studies back in September showed a ferritin of 137 with iron saturation of 23%.  Currently, his performance status is ECOG 1.     Medications:  Allergies as of 12/08/2019      Reactions   Adalimumab Other (See Comments)   "blacked out", confused "blacked out"      Medication List       Accurate as of December 08, 2019 11:56 AM. If you have any questions, ask your nurse or doctor.        STOP taking these medications   buPROPion 300 MG 24 hr tablet Commonly known as: WELLBUTRIN XL Stopped by: [name], MD     TAKE these medications   ALPRAZolam 1 MG tablet Commonly known as: Xanax Take 1/2 to 1 tablet 2 to 3 x /day ONLY if needed for Anxiety Attack(s)   aspirin EC 81 MG tablet Take 81 mg daily by mouth.   atenolol 100 MG tablet Commonly known as: Tenormin Take 1 tablet every evening for BP   busPIRone 5 MG tablet Commonly known as: BUSPAR Take 1/2 to 1 tablet 3 x /day  for Anxiety   escitalopram 20 MG tablet Commonly known as: LEXAPRO Take 1 tablet daily for Mood   famciclovir 500 MG tablet Commonly known as: FAMVIR Take 1 tablet by mouth daily.   famotidine 40 MG tablet Commonly known as: PEPCID Take 1 tabet Daily for Heartburn and Indigestion   fluticasone 50 MCG/ACT nasal spray Commonly known as: FLONASE Instill 2 sprays each Nares 2 x /day   folic acid 1 MG tablet Commonly known as: FOLVITE Take 1 tablet (1 mg total) by mouth daily.   gabapentin 300 MG capsule Commonly known as: NEURONTIN TAKE 1 CAPSULE (300 MG TOTAL) BY MOUTH 3 (THREE) TIMES DAILY AS NEEDED. FOR SHINGLES PAIN   ibuprofen 800 MG tablet Commonly known as: Motrin IB Take 1 tablet (800 mg total) by mouth daily as needed.   Imbruvica 280 MG Tabs Generic drug: Ibrutinib Take 280 mg by mouth daily.   levocetirizine 5 MG tablet Commonly known as: XYZAL Take 1 tablet Daily for Allergies   losartan 100 MG tablet Commonly known as: COZAAR Take 1 tablet at Bedtime for BP   predniSONE 20 MG tablet Commonly known as: DELTASONE 2 tablets daily for 3 days, 1 tablet daily  for 4 days.   rosuvastatin 40 MG tablet Commonly known as: CRESTOR Take 1 tablet Daily for Cholesterol   triamcinolone ointment 0.1 % Commonly known as: KENALOG Apply 1 application topically 2 (two) times daily.   vitamin B-12 500 MCG tablet Commonly known as: CYANOCOBALAMIN Take 500 mcg by mouth daily.   Vitamin D-3 125 MCG (5000 UT) Tabs Take 10,000 Units by mouth daily.       Allergies:  Allergies  Allergen Reactions  . Adalimumab Other (See Comments)    "blacked out", confused "blacked out"    Past Medical History, Surgical history, Social history, and Family History were reviewed and updated.  Review of Systems: Review of Systems  Constitutional: Negative.   HENT: Negative.   Eyes: Negative.   Respiratory: Negative.   Cardiovascular: Negative.   Gastrointestinal: Negative.    Genitourinary: Negative.   Musculoskeletal: Negative.   Skin: Negative.   Neurological: Negative.   Endo/Heme/Allergies: Negative.   Psychiatric/Behavioral: Negative.       Physical Exam:  weight is 183 lb 4 oz (83.1 kg). His temporal temperature is 97.1 F (36.2 C) (abnormal). His blood pressure is 151/80 (abnormal) and his pulse is 59 (abnormal). His respiration is 18 and oxygen saturation is 97%.   Wt Readings from Last 3 Encounters:  12/08/19 183 lb 4 oz (83.1 kg)  11/04/19 176 lb 12.8 oz (80.2 kg)  09/07/19 176 lb (79.8 kg)    Physical Exam Vitals reviewed.  HENT:     Head: Normocephalic and atraumatic.  Eyes:     Pupils: Pupils are equal, round, and reactive to light.  Cardiovascular:     Rate and Rhythm: Normal rate and regular rhythm.     Heart sounds: Normal heart sounds.  Pulmonary:     Effort: Pulmonary effort is normal.     Breath sounds: Normal breath sounds.  Abdominal:     General: Bowel sounds are normal.     Palpations: Abdomen is soft.  Musculoskeletal:        General: No tenderness or deformity. Normal range of motion.     Cervical back: Normal range of motion.  Lymphadenopathy:     Cervical: No cervical adenopathy.  Skin:    General: Skin is warm and dry.     Findings: No erythema or rash.  Neurological:     Mental Status: He is alert and oriented to person, place, and time.  Psychiatric:        Behavior: Behavior normal.        Thought Content: Thought content normal.        Judgment: Judgment normal.      Lab Results  Component Value Date   WBC 4.7 12/08/2019   HGB 13.4 12/08/2019   HCT 40.1 12/08/2019   MCV 95.7 12/08/2019   PLT 247 12/08/2019   Lab Results  Component Value Date   FERRITIN 137 08/06/2019   IRON 78 08/06/2019   TIBC 333 08/06/2019   UIBC 255 08/06/2019   IRONPCTSAT 23 08/06/2019   Lab Results  Component Value Date   RETICCTPCT 0.9 02/12/2018   RBC 4.19 (L) 12/08/2019   RETICCTABS 33,120 02/12/2018   Lab  Results  Component Value Date   KPAFRELGTCHN 29.8 (H) 08/06/2019   LAMBDASER 14.7 08/06/2019   KAPLAMBRATIO 2.03 (H) 08/06/2019   Lab Results  Component Value Date   IGGSERUM 424 (L) 08/06/2019   IGGSERUM 433 (L) 08/06/2019   IGA 36 (L) 08/06/2019   IGA 34 (L) 08/06/2019  IGMSERUM 585 (H) 08/06/2019   IGMSERUM 614 (H) 08/06/2019   Lab Results  Component Value Date   TOTALPROTELP 6.9 08/06/2019   ALBUMINELP 4.2 08/06/2019   A1GS 0.3 08/06/2019   A2GS 0.9 08/06/2019   BETS 0.8 08/06/2019   BETA2SER 0.2 02/18/2018   GAMS 0.7 08/06/2019   MSPIKE 0.3 (H) 08/06/2019   SPEI Comment 07/09/2018     Chemistry      Component Value Date/Time   NA 139 12/08/2019 1003   K 3.9 12/08/2019 1003   CL 104 12/08/2019 1003   CO2 28 12/08/2019 1003   BUN 14 12/08/2019 1003   CREATININE 1.24 12/08/2019 1003   CREATININE 1.24 11/04/2019 1146      Component Value Date/Time   CALCIUM 9.7 12/08/2019 1003   ALKPHOS 84 12/08/2019 1003   AST 25 12/08/2019 1003   ALT 25 12/08/2019 1003   BILITOT 0.7 12/08/2019 1003      Impression and Plan: Mr. Stumbo is a very pleasant 59 yo caucasian gentleman with Waldenstrom's macroglobulinemia.   I think that the IgM level today will be important for [place].  We may have to increase the Imbruvica if his IgM level is increased.  I suppose that we could always try a different medication.  I would like to get him back in about 3 more months for follow-up.       [name], MD 1/12/202111:56 AM

## 2019-12-09 LAB — IGG, IGA, IGM
IgA: 33 mg/dL — ABNORMAL LOW (ref 90–386)
IgG (Immunoglobin G), Serum: 390 mg/dL — ABNORMAL LOW (ref 603–1613)
IgM (Immunoglobulin M), Srm: 465 mg/dL — ABNORMAL HIGH (ref 20–172)

## 2019-12-09 LAB — KAPPA/LAMBDA LIGHT CHAINS
Kappa free light chain: 32.4 mg/L — ABNORMAL HIGH (ref 3.3–19.4)
Kappa, lambda light chain ratio: 2.38 — ABNORMAL HIGH (ref 0.26–1.65)
Lambda free light chains: 13.6 mg/L (ref 5.7–26.3)

## 2019-12-11 LAB — IMMUNOFIXATION REFLEX, SERUM
IgA: 33 mg/dL — ABNORMAL LOW (ref 90–386)
IgG (Immunoglobin G), Serum: 396 mg/dL — ABNORMAL LOW (ref 603–1613)
IgM (Immunoglobulin M), Srm: 493 mg/dL — ABNORMAL HIGH (ref 20–172)

## 2019-12-11 LAB — PROTEIN ELECTROPHORESIS, SERUM, WITH REFLEX
A/G Ratio: 1.7 (ref 0.7–1.7)
Albumin ELP: 4 g/dL (ref 2.9–4.4)
Alpha-1-Globulin: 0.2 g/dL (ref 0.0–0.4)
Alpha-2-Globulin: 0.7 g/dL (ref 0.4–1.0)
Beta Globulin: 0.8 g/dL (ref 0.7–1.3)
Gamma Globulin: 0.7 g/dL (ref 0.4–1.8)
Globulin, Total: 2.4 g/dL (ref 2.2–3.9)
M-Spike, %: 0.3 g/dL — ABNORMAL HIGH
SPEP Interpretation: 0
Total Protein ELP: 6.4 g/dL (ref 6.0–8.5)

## 2019-12-14 ENCOUNTER — Telehealth: Payer: Self-pay | Admitting: *Deleted

## 2019-12-14 NOTE — Telephone Encounter (Signed)
Pt notified per order of Dr. Marin Olp that "the IgM level is now below 500.  This is great news!!"  Patient appreciative of call and requests a letter from Dr. Marin Olp stating that he is totally disabled for tax purposes.  Dr. Marin Olp notified.

## 2019-12-14 NOTE — Telephone Encounter (Signed)
-----   Message from [name], MD sent at 12/14/2019  6:59 AM EST ----- Call - the IgM level is now below 500.  This is great news!!  Edwin Webster

## 2019-12-15 ENCOUNTER — Telehealth: Payer: Self-pay | Admitting: *Deleted

## 2019-12-15 NOTE — Telephone Encounter (Signed)
Call placed to patient to notify him per order of Dr. Marin Olp to contact his PCP regarding letter for disability for tax purposes.  Pt appreciative of call and states that he will contact his PCP regarding letter.

## 2019-12-29 ENCOUNTER — Other Ambulatory Visit: Payer: Self-pay | Admitting: Hematology & Oncology

## 2019-12-29 DIAGNOSIS — C880 Waldenstrom macroglobulinemia: Secondary | ICD-10-CM

## 2020-01-01 MED FILL — IMBRUVICA 280 MG TAB: 280 | 28 days supply | Qty: 28 | Fill #0

## 2020-01-20 ENCOUNTER — Other Ambulatory Visit: Payer: Self-pay

## 2020-01-20 MED ORDER — ROSUVASTATIN CALCIUM 40 MG PO TABS: ORAL_TABLET | ORAL | 3 refills | Status: DC

## 2020-01-27 MED FILL — IMBRUVICA 280 MG TAB: 280 | 28 days supply | Qty: 28 | Fill #1

## 2020-02-09 ENCOUNTER — Ambulatory Visit: Payer: 59 | Admitting: Internal Medicine

## 2020-02-10 ENCOUNTER — Other Ambulatory Visit: Payer: Self-pay | Admitting: Hematology & Oncology

## 2020-02-10 DIAGNOSIS — B029 Zoster without complications: Secondary | ICD-10-CM

## 2020-02-18 MED FILL — IMBRUVICA 280 MG TAB: 280 | 28 days supply | Qty: 28 | Fill #2

## 2020-02-21 ENCOUNTER — Encounter: Payer: Self-pay | Admitting: Internal Medicine

## 2020-02-21 NOTE — Patient Instructions (Signed)
Vit D  & Vit C 1,000 mg   are recommended to help protect  against the Covid-19 and other Corona viruses.    Also it's recommended  to take  Zinc 50 mg  to help  protect against the Covid-19   and best place to get  is also on [employer]  and don't pay more than 6-8 cents /pill !   ===================================== Coronavirus (COVID-19) Are you at risk?  Are you at risk for the Coronavirus (COVID-19)?  To be considered HIGH RISK for Coronavirus (COVID-19), you have to meet the following criteria:  . Traveled to [place], [place], [place], [place] or [place]; or in the [place] to [place], [place], [place]  . or [place]; and have fever, cough, and shortness of breath within the last 2 weeks of travel OR . Been in close contact with a person diagnosed with COVID-19 within the last 2 weeks and have  . fever, cough,and shortness of breath .  . IF YOU DO NOT MEET THESE CRITERIA, YOU ARE CONSIDERED LOW RISK FOR COVID-19.  What to do if you are HIGH RISK for COVID-19?  . If you are having a medical emergency, call 911. . Seek medical care right away. Before you go to a doctor's office, urgent care or emergency department, .  call ahead and tell them about your recent travel, contact with someone diagnosed with COVID-19  .  and your symptoms.  . You should receive instructions from your physician's office regarding next steps of care.  . When you arrive at healthcare provider, tell the healthcare staff immediately you have returned from  . visiting [place], [place], [place], [place] or [place]; or traveled in the [place] to [place], [place],  . [place] or [place] in the last two weeks or you have been in close contact with a person diagnosed with  . COVID-19 in the last 2 weeks.   . Tell the health care staff about your symptoms: fever, cough and shortness of breath. . After you have been seen by a medical provider, you will be either: o Tested for  (COVID-19) and discharged home on quarantine except to seek medical care if  o symptoms worsen, and asked to  - Stay home and avoid contact with others until you get your results (4-5 days)  - Avoid travel on public transportation if possible (such as bus, train, or airplane) or o Sent to the Emergency Department by EMS for evaluation, COVID-19 testing  and  o possible admission depending on your condition and test results.  What to do if you are LOW RISK for COVID-19?  Reduce your risk of any infection by using the same precautions used for avoiding the common cold or flu:  . Wash your hands often with soap and warm water for at least 20 seconds.  If soap and water are not readily available,  . use an alcohol-based hand sanitizer with at least 60% alcohol.  . If coughing or sneezing, cover your mouth and nose by coughing or sneezing into the elbow areas of your shirt or coat, .  into a tissue or into your sleeve (not your hands). . Avoid shaking hands with others and consider head nods or verbal greetings only. . Avoid touching your eyes, nose, or mouth with unwashed hands.  . Avoid close contact with people who are sick. . Avoid places or events with large numbers of people in one location, like concerts or sporting events. .   Carefully consider travel plans you have or are making. . If you are planning any travel outside or inside the [place], visit the CDC's Travelers' Health webpage for the latest health notices. . If you have some symptoms but not all symptoms, continue to monitor at home and seek medical attention  . if your symptoms worsen. . If you are having a medical emergency, call 911.   ++++++++++++++++++++++++++++++++ Recommend Adult Low Dose Aspirin or  coated  Aspirin 81 mg daily  To reduce risk of Colon Cancer 40 %,  Skin Cancer 26 % ,  Melanoma 46%  and  Pancreatic cancer 60% ++++++++++++++++++++++++++++++++ Vitamin D goal  is between 70-100.  Please make sure that  you are taking your Vitamin D as directed.  It is very important as a natural anti-inflammatory  helping hair, skin, and nails, as well as reducing stroke and heart attack risk.  It helps your bones and helps with mood. It also decreases numerous cancer risks so please take it as directed.  Low Vit D is associated with a 200-300% higher risk for CANCER  and 200-300% higher risk for HEART   ATTACK  &  STROKE.   ...................................... It is also associated with higher death rate at younger ages,  autoimmune diseases like Rheumatoid arthritis, Lupus, Multiple Sclerosis.    Also many other serious conditions, like depression, Alzheimer's Dementia, infertility, muscle aches, fatigue, fibromyalgia - just to name a few. ++++++++++++++++++++ Recommend the book "The END of DIETING" by Dr Joel Fuhrman  & the book "The END of DIABETES " by Dr Joel Fuhrman At [employer] - get book & Audio CD's    Being diabetic has a  300% increased risk for heart attack, stroke, cancer, and alzheimer- type vascular dementia. It is very important that you work harder with diet by avoiding all foods that are white. Avoid white rice (Tsou & wild rice is OK), white potatoes (sweetpotatoes in moderation is OK), White bread or wheat bread or anything made out of white flour like bagels, donuts, rolls, buns, biscuits, cakes, pastries, cookies, pizza crust, and pasta (made from white flour & egg whites) - vegetarian pasta or spinach or wheat pasta is OK. Multigrain breads like Arnold's or Pepperidge Farm, or multigrain sandwich thins or flatbreads.  Diet, exercise and weight loss can reverse and cure diabetes in the early stages.  Diet, exercise and weight loss is very important in the control and prevention of complications of diabetes which affects every system in your body, ie. Brain - dementia/stroke, eyes - glaucoma/blindness, heart - heart attack/heart failure, kidneys - dialysis, stomach - gastric paralysis,  intestines - malabsorption, nerves - severe painful neuritis, circulation - gangrene & loss of a leg(s), and finally cancer and Alzheimers.    I recommend avoid fried & greasy foods,  sweets/candy, white rice (Petrosian or wild rice or Quinoa is OK), white potatoes (sweet potatoes are OK) - anything made from white flour - bagels, doughnuts, rolls, buns, biscuits,white and wheat breads, pizza crust and traditional pasta made of white flour & egg white(vegetarian pasta or spinach or wheat pasta is OK).  Multi-grain bread is OK - like multi-grain flat bread or sandwich thins. Avoid alcohol in excess. Exercise is also important.    Eat all the vegetables you want - avoid meat, especially red meat and dairy - especially cheese.  Cheese is the most concentrated form of trans-fats which is the worst thing to clog up our arteries. Veggie cheese is OK which can be   found in the fresh produce section at [employer] or [employer] or [employer]  +++++++++++++++++++++ DASH Eating Plan  DASH stands for "Dietary Approaches to Stop Hypertension."   The DASH eating plan is a healthy eating plan that has been shown to reduce high blood pressure (hypertension). Additional health benefits may include reducing the risk of type 2 diabetes mellitus, heart disease, and stroke. The DASH eating plan may also help with weight loss. WHAT DO I NEED TO KNOW ABOUT THE DASH EATING PLAN? For the DASH eating plan, you will follow these general guidelines:  Choose foods with a percent daily value for sodium of less than 5% (as listed on the food label).  Use salt-free seasonings or herbs instead of table salt or sea salt.  Check with your health care provider or pharmacist before using salt substitutes.  Eat lower-sodium products, often labeled as "lower sodium" or "no salt added."  Eat fresh foods.  Eat more vegetables, fruits, and low-fat dairy products.  Choose whole grains. Look for the word "whole" as the first word in  the ingredient list.  Choose fish   Limit sweets, desserts, sugars, and sugary drinks.  Choose heart-healthy fats.  Eat veggie cheese   Eat more home-cooked food and less restaurant, buffet, and fast food.  Limit fried foods.  Cook foods using methods other than frying.  Limit canned vegetables. If you do use them, rinse them well to decrease the sodium.  When eating at a restaurant, ask that your food be prepared with less salt, or no salt if possible.                      WHAT FOODS CAN I EAT? Read Dr Joel Fuhrman's books on The End of Dieting & The End of Diabetes  Grains Whole grain or whole wheat bread. Sangster rice. Whole grain or whole wheat pasta. Quinoa, bulgur, and whole grain cereals. Low-sodium cereals. Corn or whole wheat flour tortillas. Whole grain cornbread. Whole grain crackers. Low-sodium crackers.  Vegetables Fresh or frozen vegetables (raw, steamed, roasted, or grilled). Low-sodium or reduced-sodium tomato and vegetable juices. Low-sodium or reduced-sodium tomato sauce and paste. Low-sodium or reduced-sodium canned vegetables.   Fruits All fresh, canned (in natural juice), or frozen fruits.  Protein Products  All fish and seafood.  Dried beans, peas, or lentils. Unsalted nuts and seeds. Unsalted canned beans.  Dairy Low-fat dairy products, such as skim or 1% milk, 2% or reduced-fat cheeses, low-fat ricotta or cottage cheese, or plain low-fat yogurt. Low-sodium or reduced-sodium cheeses.  Fats and Oils Tub margarines without trans fats. Light or reduced-fat mayonnaise and salad dressings (reduced sodium). Avocado. Safflower, olive, or canola oils. Natural peanut or almond butter.  Other Unsalted popcorn and pretzels. The items listed above may not be a complete list of recommended foods or beverages. Contact your dietitian for more options.  +++++++++++++++  WHAT FOODS ARE NOT RECOMMENDED? Grains/ White flour or wheat flour White bread. White pasta.  White rice. Refined cornbread. Bagels and croissants. Crackers that contain trans fat.  Vegetables  Creamed or fried vegetables. Vegetables in a . Regular canned vegetables. Regular canned tomato sauce and paste. Regular tomato and vegetable juices.  Fruits Dried fruits. Canned fruit in light or heavy syrup. Fruit juice.  Meat and Other Protein Products Meat in general - RED meat & White meat.  Fatty cuts of meat. Ribs, chicken wings, all processed meats as bacon, sausage, bologna, salami, fatback, hot dogs, bratwurst and packaged   luncheon meats.  Dairy Whole or 2% milk, cream, half-and-half, and cream cheese. Whole-fat or sweetened yogurt. Full-fat cheeses or blue cheese. Non-dairy creamers and whipped toppings. Processed cheese, cheese spreads, or cheese curds.  Condiments Onion and garlic salt, seasoned salt, table salt, and sea salt. Canned and packaged gravies. Worcestershire sauce. Tartar sauce. Barbecue sauce. Teriyaki sauce. Soy sauce, including reduced sodium. Steak sauce. Fish sauce. Oyster sauce. Cocktail sauce. Horseradish. Ketchup and mustard. Meat flavorings and tenderizers. Bouillon cubes. Hot sauce. Tabasco sauce. Marinades. Taco seasonings. Relishes.  Fats and Oils Butter, stick margarine, lard, shortening and bacon fat. Coconut, palm kernel, or palm oils. Regular salad dressings.  Pickles and olives. Salted popcorn and pretzels.  The items listed above may not be a complete list of foods and beverages to avoid.   

## 2020-02-21 NOTE — Progress Notes (Signed)
History of Present Illness:       This very nice 59 y.o. MWM presents for 6  month follow up with HTN, HLD, Pre-Diabetes and Vitamin D Deficiency. Patient has GERD controlled on his meds. 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     In May 2019, patient was dx'd withWaldenstrom's macroglobulinemiaby Dr Marin Olp and started on Chemotx - currently on Imbruvica .       Patient is treated for HTN (2013)  & BP has been controlled at home. Today's BP is very elevated at 190/100 and rechecked at 182/110. Apparently he has stopped his Losartan.  Patient has had no complaints of any cardiac type chest pain, palpitations, dyspnea / orthopnea / PND, dizziness, claudication, or dependent edema.      Hyperlipidemia is controlled with diet & meds. Patient denies myalgias or other med SE's. Last Lipids were at goal:  Lab Results  Component Value Date   CHOL 132 11/04/2019   HDL 47 11/04/2019   LDLCALC 71 11/04/2019   TRIG 61 11/04/2019   CHOLHDL 2.8 11/04/2019    Also, the patient has history of PreDiabetes (A1c 5.7% / 2017)  and has had no symptoms of reactive hypoglycemia, diabetic polys, paresthesias or visual blurring.  Last A1c was Normal & at goal:  Lab Results  Component Value Date   HGBA1C 5.3 08/05/2019       Further, the patient also has history of Vitamin D Deficiency ("38" / 2014)  and supplements vitamin D without any suspected side-effects. Last vitamin D was at goal:  Lab Results  Component Value Date   VD25OH 66 08/05/2019    Current Outpatient Medications on File Prior to Visit  Medication Sig  . ALPRAZolam (XANAX) 1 MG tablet Take 1/2 to 1 tablet 2 to 3 x /day ONLY if needed for Anxiety Attack(s)  . aspirin EC 81 MG tablet Take 81 mg daily by mouth.  Marland Kitchen atenolol (TENORMIN) 100 MG tablet Take 1 tablet every evening for BP  . busPIRone (BUSPAR) 5 MG tablet Take 1/2 to 1 tablet 3 x /day for Anxiety  .  Cholecalciferol (VITAMIN D-3) 5000 UNITS TABS Take 10,000 Units by mouth daily.   Marland Kitchen escitalopram (LEXAPRO) 20 MG tablet Take 1 tablet daily for Mood  . famciclovir (FAMVIR) 500 MG tablet TAKE 1 TABLET BY MOUTH EVERY DAY  . famotidine (PEPCID) 40 MG tablet Take 1 tabet Daily for Heartburn and Indigestion  . fluticasone (FLONASE) 50 MCG/ACT nasal spray Instill 2 sprays each Nares 2 x /day  . folic acid (FOLVITE) 1 MG tablet Take 1 tablet (1 mg total) by mouth daily.  Marland Kitchen gabapentin (NEURONTIN) 300 MG capsule TAKE 1 CAPSULE (300 MG TOTAL) BY MOUTH 3 (THREE) TIMES DAILY AS NEEDED. FOR SHINGLES PAIN  . ibuprofen (ADVIL,MOTRIN) 800 MG tablet Take 1 tablet (800 mg total) by mouth daily as needed.  . IMBRUVICA 280 MG TABS TAKE 1 TABLET (280 MG) BY MOUTH DAILY.  Marland Kitchen levocetirizine (XYZAL) 5 MG tablet Take 1 tablet Daily for Allergies  . rosuvastatin (CRESTOR) 40 MG tablet Take 1 tablet Daily for Cholesterol  . triamcinolone ointment (KENALOG) 0.1 % Apply 1 application topically 2 (two) times daily.  . vitamin B-12 (CYANOCOBALAMIN) 500 MCG tablet Take 500 mcg by mouth daily.  Marland Kitchen losartan (COZAAR) 100 MG tablet Take 1 tablet at Bedtime for BP (Patient not taking: Reported on 02/22/2020)  No current facility-administered medications on file prior to visit.    Allergies  Allergen Reactions  . Adalimumab Other (See Comments)    "blacked out", confused "blacked out"    PMHx:   Past Medical History:  Diagnosis Date  . Allergy   . Anemia   . Anxiety   . Arthritis   . Chest pain, non-cardiac   . COPD (chronic obstructive pulmonary disease) ([place])   . Counseling regarding goals of care 03/27/2018  . Depression   . Diverticulitis   . Diverticulosis   . Fractured pelvis ([place])    fall from ladder  . GERD (gastroesophageal reflux disease)   . Gout   . History of shingles   . Hyperlipidemia   . Internal hemorrhoids   . Iron deficiency anemia 03/13/2018  . Kidney stones   . Other abnormal glucose  10/11/2015  . Rheumatoid arthritis ([place])    hands  . Waldenstrom's macroglobulinemia ([place]) 03/27/2018    Immunization History  Administered Date(s) Administered  . DTaP 06/04/2011  . Hepatitis B 11/26/2010  . Hepatitis B, ped/adol 08/06/2011  . Influenza Inj Mdck Quad With Preservative 09/13/2017, 08/21/2018  . Influenza Split 09/02/2014  . Influenza Whole 09/17/2013  . Influenza, Seasonal, Injecte, Preservative Fre 10/11/2015  . Influenza-Unspecified 09/13/2017  . PPD Test 10/11/2015, 08/05/2019    Past Surgical History:  Procedure Laterality Date  . COLONOSCOPY    . FINGER SURGERY    . iron infusion    . KNEE SURGERY     right  . ROTATOR CUFF REPAIR  1996   left    FHx:    Reviewed / unchanged  SHx:    Reviewed / unchanged   Systems Review:  Constitutional: Denies fever, chills, wt changes, headaches, insomnia, fatigue, night sweats, change in appetite. Eyes: Denies redness, blurred vision, diplopia, discharge, itchy, watery eyes.  ENT: Denies discharge, congestion, post nasal drip, epistaxis, sore throat, earache, hearing loss, dental pain, tinnitus, vertigo, sinus pain, snoring.  CV: Denies chest pain, palpitations, irregular heartbeat, syncope, dyspnea, diaphoresis, orthopnea, PND, claudication or edema. Respiratory: denies cough, dyspnea, DOE, pleurisy, hoarseness, laryngitis, wheezing.  Gastrointestinal: Denies dysphagia, odynophagia, heartburn, reflux, water brash, abdominal pain or cramps, nausea, vomiting, bloating, diarrhea, constipation, hematemesis, melena, hematochezia  or hemorrhoids. Genitourinary: Denies dysuria, frequency, urgency, nocturia, hesitancy, discharge, hematuria or flank pain. Musculoskeletal: Denies arthralgias, myalgias, stiffness, jt. swelling, pain, limping or strain/sprain.  Skin: Denies pruritus, rash, hives, warts, acne, eczema or change in skin lesion(s). Neuro: No weakness, tremor, incoordination, spasms, paresthesia or  pain. Psychiatric: Denies confusion, memory loss or sensory loss. Endo: Denies change in weight, skin or hair change.  Heme/Lymph: No excessive bleeding, bruising or enlarged lymph nodes.  Physical Exam  BP (!) 190/100   Pulse (!) 56   Temp (!) 96.7 F (35.9 C)   Resp 16   Ht 5' 9.5" (1.765 m)   Wt 181 lb 12.8 oz (82.5 kg)   BMI 26.46 kg/m   Appears  well nourished, well groomed  and in no distress.  Eyes: PERRLA, EOMs, conjunctiva no swelling or erythema. Sinuses: No frontal/maxillary tenderness ENT/Mouth: EAC's clear, TM's nl w/o erythema, bulging. Nares clear w/o erythema, swelling, exudates. Oropharynx clear without erythema or exudates. Oral hygiene is good. Tongue normal, non obstructing. Hearing intact.  Neck: Supple. Thyroid not palpable. Car 2+/2+ without bruits, nodes or JVD. Chest: Respirations nl with BS clear & equal w/o rales, rhonchi, wheezing or stridor.  Cor: Heart sounds normal w/ regular rate and rhythm  without sig. murmurs, gallops, clicks or rubs. Peripheral pulses normal and equal  without edema.  Abdomen: Soft & bowel sounds normal. Non-tender w/o guarding, rebound, hernias, masses or organomegaly.  Lymphatics: Unremarkable.  Musculoskeletal: Full ROM all peripheral extremities, joint stability, 5/5 strength and normal gait.  Skin: Warm, dry without exposed rashes, lesions or ecchymosis apparent.  Neuro: Cranial nerves intact, reflexes equal bilaterally. Sensory-motor testing grossly intact. Tendon reflexes grossly intact.  Pysch: Alert & oriented x 3.  Insight and judgement nl & appropriate. No ideations.  Assessment and Plan:  1. Essential hypertension  - Recommend change Atenolol 100 mg dosing to qam, D/C Losartan & Replace with Olmesartan 40 mg  qhs.  Monitor blood pressure and call if BP  remains elevated >140/90  - Continue DASH diet.  Reminder to go to the ER if any CP,  SOB, nausea, dizziness, severe HA, changes vision/speech.  - CBC with  Differential/Platelet - COMPLETE METABOLIC PANEL WITH GFR - Magnesium - TSH  2. Hyperlipidemia, mixed  - Continue diet/meds, exercise,& lifestyle modifications.  - Continue monitor periodic cholesterol/liver & renal functions   - Lipid panel - TSH  3. Abnormal glucose  - Continue diet, exercise  - Lifestyle modifications.  - Monitor appropriate labs  - Hemoglobin A1c - Insulin, random  4. Vitamin D deficiency  - Continue supplementation.  - VITAMIN D 25 Hydroxy   5. Waldenstrom's macroglobulinemia ([place])  - CBC with Differential/Platelet - COMPLETE METABOLIC PANEL WITH GFR  6. Seronegative rheumatoid arthritis ([place])  7. Prediabetes  - Hemoglobin A1c - Insulin, random  8. Medication management  - CBC with Differential/Platelet - COMPLETE METABOLIC PANEL WITH GFR - Magnesium - Lipid panel - TSH - Hemoglobin A1c - Insulin, random - VITAMIN D 25 Hydroxy        Discussed  regular exercise, BP monitoring, weight control to achieve/maintain BMI less than 25 and discussed med and SE's. Recommended labs to assess and monitor clinical status with further disposition pending results of labs.  I discussed the assessment and treatment plan with the patient. The patient was provided an opportunity to ask questions and all were answered. The patient agreed with the plan and demonstrated an understanding of the instructions.  I provided over 30 minutes of exam, counseling, chart review and  complex critical decision making.   Kirtland Bouchard, MD

## 2020-02-22 ENCOUNTER — Ambulatory Visit: Payer: 59 | Admitting: Internal Medicine

## 2020-02-22 ENCOUNTER — Other Ambulatory Visit: Payer: Self-pay

## 2020-02-22 VITALS — BP 190/100 | HR 56 | Temp 96.7°F | Resp 16 | Ht 69.5 in | Wt 181.8 lb

## 2020-02-22 DIAGNOSIS — Z79899 Other long term (current) drug therapy: Secondary | ICD-10-CM

## 2020-02-22 DIAGNOSIS — R7303 Prediabetes: Secondary | ICD-10-CM

## 2020-02-22 DIAGNOSIS — E559 Vitamin D deficiency, unspecified: Secondary | ICD-10-CM | POA: Diagnosis not present

## 2020-02-22 DIAGNOSIS — I10 Essential (primary) hypertension: Secondary | ICD-10-CM | POA: Diagnosis not present

## 2020-02-22 DIAGNOSIS — E782 Mixed hyperlipidemia: Secondary | ICD-10-CM

## 2020-02-22 DIAGNOSIS — R7309 Other abnormal glucose: Secondary | ICD-10-CM

## 2020-02-22 DIAGNOSIS — C880 Waldenstrom macroglobulinemia: Secondary | ICD-10-CM

## 2020-02-22 DIAGNOSIS — M0600 Rheumatoid arthritis without rheumatoid factor, unspecified site: Secondary | ICD-10-CM

## 2020-02-22 MED ORDER — ATENOLOL 100 MG PO TABS: ORAL_TABLET | ORAL | 3 refills | Status: DC

## 2020-02-22 MED ORDER — OLMESARTAN MEDOXOMIL 40 MG PO TABS: ORAL_TABLET | ORAL | 3 refills | Status: DC

## 2020-02-23 LAB — CBC WITH DIFFERENTIAL/PLATELET
Absolute Monocytes: 546 cells/uL (ref 200–950)
Basophils Absolute: 38 cells/uL (ref 0–200)
Basophils Relative: 0.9 %
Eosinophils Absolute: 71 cells/uL (ref 15–500)
Eosinophils Relative: 1.7 %
HCT: 40.8 % (ref 38.5–50.0)
Hemoglobin: 13.6 g/dL (ref 13.2–17.1)
Lymphs Abs: 949 cells/uL (ref 850–3900)
MCH: 32.6 pg (ref 27.0–33.0)
MCHC: 33.3 g/dL (ref 32.0–36.0)
MCV: 97.8 fL (ref 80.0–100.0)
MPV: 11.6 fL (ref 7.5–12.5)
Monocytes Relative: 13 %
Neutro Abs: 2596 cells/uL (ref 1500–7800)
Neutrophils Relative %: 61.8 %
Platelets: 225 10*3/uL (ref 140–400)
RBC: 4.17 10*6/uL — ABNORMAL LOW (ref 4.20–5.80)
RDW: 13.6 % (ref 11.0–15.0)
Total Lymphocyte: 22.6 %
WBC: 4.2 10*3/uL (ref 3.8–10.8)

## 2020-02-23 LAB — LIPID PANEL
Cholesterol: 154 mg/dL (ref <200)
HDL: 49 mg/dL (ref >=40)
LDL Cholesterol (Calc): 91 mg/dL (calc)
Non-HDL Cholesterol (Calc): 105 mg/dL (calc) (ref <130)
Total CHOL/HDL Ratio: 3.1 (calc) (ref <5.0)
Triglycerides: 56 mg/dL (ref <150)

## 2020-02-23 LAB — TSH: TSH: 2.45 mIU/L (ref 0.40–4.50)

## 2020-02-23 LAB — HEMOGLOBIN A1C
Hgb A1c MFr Bld: 5.3 % of total Hgb (ref <5.7)
Mean Plasma Glucose: 105 (calc)
eAG (mmol/L): 5.8 (calc)

## 2020-02-23 LAB — COMPLETE METABOLIC PANEL WITH GFR
AG Ratio: 2.1 (calc) (ref 1.0–2.5)
ALT: 14 U/L (ref 9–46)
AST: 18 U/L (ref 10–35)
Albumin: 4.4 g/dL (ref 3.6–5.1)
Alkaline phosphatase (APISO): 83 U/L (ref 35–144)
BUN: 12 mg/dL (ref 7–25)
CO2: 28 mmol/L (ref 20–32)
Calcium: 9.8 mg/dL (ref 8.6–10.3)
Chloride: 107 mmol/L (ref 98–110)
Creat: 1.14 mg/dL (ref 0.70–1.25)
GFR, Est African American: 80 mL/min/{1.73_m2} (ref >=60)
GFR, Est Non African American: 69 mL/min/{1.73_m2} (ref >=60)
Globulin: 2.1 g/dL (calc) (ref 1.9–3.7)
Glucose, Bld: 83 mg/dL (ref 65–99)
Potassium: 4.8 mmol/L (ref 3.5–5.3)
Sodium: 142 mmol/L (ref 135–146)
Total Bilirubin: 0.6 mg/dL (ref 0.2–1.2)
Total Protein: 6.5 g/dL (ref 6.1–8.1)

## 2020-02-23 LAB — INSULIN, RANDOM: Insulin: 1.9 u[IU]/mL

## 2020-02-23 LAB — VITAMIN D 25 HYDROXY (VIT D DEFICIENCY, FRACTURES): Vit D, 25-Hydroxy: 82 ng/mL (ref 30–100)

## 2020-02-23 LAB — MAGNESIUM: Magnesium: 2.1 mg/dL (ref 1.5–2.5)

## 2020-02-29 ENCOUNTER — Other Ambulatory Visit: Payer: Self-pay | Admitting: Family

## 2020-03-02 ENCOUNTER — Encounter: Payer: Self-pay | Admitting: Internal Medicine

## 2020-03-07 ENCOUNTER — Other Ambulatory Visit: Payer: Self-pay

## 2020-03-07 ENCOUNTER — Inpatient Hospital Stay: Payer: 59

## 2020-03-07 ENCOUNTER — Inpatient Hospital Stay: Payer: 59 | Attending: Hematology & Oncology | Admitting: Hematology & Oncology

## 2020-03-07 ENCOUNTER — Encounter: Payer: Self-pay | Admitting: Hematology & Oncology

## 2020-03-07 ENCOUNTER — Telehealth: Payer: Self-pay | Admitting: Hematology & Oncology

## 2020-03-07 VITALS — BP 147/83 | HR 54 | Temp 97.1°F | Resp 18 | Wt 180.0 lb

## 2020-03-07 DIAGNOSIS — C880 Waldenstrom macroglobulinemia: Secondary | ICD-10-CM

## 2020-03-07 DIAGNOSIS — D509 Iron deficiency anemia, unspecified: Secondary | ICD-10-CM | POA: Insufficient documentation

## 2020-03-07 DIAGNOSIS — D500 Iron deficiency anemia secondary to blood loss (chronic): Secondary | ICD-10-CM

## 2020-03-07 LAB — CBC WITH DIFFERENTIAL (CANCER CENTER ONLY)
Abs Immature Granulocytes: 0.02 10*3/uL (ref 0.00–0.07)
Basophils Absolute: 0 10*3/uL (ref 0.0–0.1)
Basophils Relative: 1 %
Eosinophils Absolute: 0.1 10*3/uL (ref 0.0–0.5)
Eosinophils Relative: 2 %
HCT: 40.3 % (ref 39.0–52.0)
Hemoglobin: 13.4 g/dL (ref 13.0–17.0)
Immature Granulocytes: 0 %
Lymphocytes Relative: 14 %
Lymphs Abs: 0.6 10*3/uL — ABNORMAL LOW (ref 0.7–4.0)
MCH: 31.8 pg (ref 26.0–34.0)
MCHC: 33.3 g/dL (ref 30.0–36.0)
MCV: 95.7 fL (ref 80.0–100.0)
Monocytes Absolute: 0.5 10*3/uL (ref 0.1–1.0)
Monocytes Relative: 11 %
Neutro Abs: 3.3 10*3/uL (ref 1.7–7.7)
Neutrophils Relative %: 72 %
Platelet Count: 214 10*3/uL (ref 150–400)
RBC: 4.21 MIL/uL — ABNORMAL LOW (ref 4.22–5.81)
RDW: 13.4 % (ref 11.5–15.5)
WBC Count: 4.6 10*3/uL (ref 4.0–10.5)
nRBC: 0 % (ref 0.0–0.2)

## 2020-03-07 LAB — FERRITIN: Ferritin: 68 ng/mL (ref 24–336)

## 2020-03-07 LAB — LACTATE DEHYDROGENASE: LDH: 180 U/L (ref 98–192)

## 2020-03-07 LAB — CMP (CANCER CENTER ONLY)
ALT: 11 U/L (ref 0–44)
AST: 18 U/L (ref 15–41)
Albumin: 4.6 g/dL (ref 3.5–5.0)
Alkaline Phosphatase: 82 U/L (ref 38–126)
Anion gap: 10 (ref 5–15)
BUN: 16 mg/dL (ref 8–23)
CO2: 26 mmol/L (ref 22–32)
Calcium: 9.6 mg/dL (ref 8.9–10.3)
Chloride: 105 mmol/L (ref 98–111)
Creatinine: 1.23 mg/dL (ref 0.61–1.24)
GFR, Est AFR Am: >60 mL/min (ref >60)
GFR, Estimated: >60 mL/min (ref >60)
Glucose, Bld: 103 mg/dL — ABNORMAL HIGH (ref 70–99)
Potassium: 3.8 mmol/L (ref 3.5–5.1)
Sodium: 141 mmol/L (ref 135–145)
Total Bilirubin: 0.9 mg/dL (ref 0.3–1.2)
Total Protein: 6.6 g/dL (ref 6.5–8.1)

## 2020-03-07 LAB — IRON AND TIBC
Iron: 69 ug/dL (ref 42–163)
Saturation Ratios: 22 % (ref 20–55)
TIBC: 315 ug/dL (ref 202–409)
UIBC: 246 ug/dL (ref 117–376)

## 2020-03-07 NOTE — Telephone Encounter (Signed)
Appointments scheduled calendar printed per 4/12 los °

## 2020-03-07 NOTE — Progress Notes (Signed)
Hematology and Oncology Follow Up Visit  Edwin Webster [id number] 04-30-1961 59 y.o. 03/07/2020   Principle Diagnosis:  Waldenstrom's macroglobulinemia Iron deficiency anemia  Current Therapy:   Rituxan/bendamustine-s/p cycle 2 -- d/c on 06/11/2018 Imbruvica  280 mg po q day -- changed on 08/06/2019 IV iron as indicated - last received in April 2019   Interim History:  Edwin Webster is here today for follow-up.  So far, everything is going quite well.  He has had no problems since we last saw him back in January.  He got through the wintertime without any problem.  He does have some fatigue.  When we last saw him, his M spike was 0.3 g/dL.  His IgM level was 465 mg/dL.  Everything still is improving.  He has had no abdominal pain.  There is no nausea or vomiting.  He did have a skin cancer removed from his nose.  Thankfully, this was not melanoma.  He has had no change in bowel or bladder habits.  He has had no leg swelling.  He has had no rashes.  There has been no bleeding.  Currently, his performance status is ECOG 1.     Medications:  Allergies as of 03/07/2020      Reactions   Adalimumab Other (See Comments)   "blacked out", confused "blacked out"      Medication List       Accurate as of March 07, 2020  9:13 AM. If you have any questions, ask your nurse or doctor.        ALPRAZolam 1 MG tablet Commonly known as: Xanax Take 1/2 to 1 tablet 2 to 3 x /day ONLY if needed for Anxiety Attack(s)   aspirin EC 81 MG tablet Take 81 mg daily by mouth.   atenolol 100 MG tablet Commonly known as: Tenormin Take 1 tablet every Morning  for BP   busPIRone 5 MG tablet Commonly known as: BUSPAR Take 1/2 to 1 tablet 3 x /day for Anxiety   escitalopram 20 MG tablet Commonly known as: LEXAPRO Take 1 tablet daily for Mood   famciclovir 500 MG tablet Commonly known as: FAMVIR TAKE 1 TABLET BY MOUTH EVERY DAY   famotidine 40 MG tablet Commonly known as: PEPCID Take 1  tabet Daily for Heartburn and Indigestion   fluticasone 50 MCG/ACT nasal spray Commonly known as: FLONASE Instill 2 sprays each Nares 2 x /day   folic acid 1 MG tablet Commonly known as: FOLVITE Take 1 tablet (1 mg total) by mouth daily.   gabapentin 300 MG capsule Commonly known as: NEURONTIN TAKE 1 CAPSULE (300 MG TOTAL) BY MOUTH 3 (THREE) TIMES DAILY AS NEEDED. FOR SHINGLES PAIN   ibuprofen 800 MG tablet Commonly known as: Motrin IB Take 1 tablet (800 mg total) by mouth daily as needed.   Imbruvica 280 MG Tabs Generic drug: Ibrutinib TAKE 1 TABLET (280 MG) BY MOUTH DAILY.   levocetirizine 5 MG tablet Commonly known as: XYZAL Take 1 tablet Daily for Allergies   olmesartan 40 MG tablet Commonly known as: BENICAR Take 1 tablet at Bedtime  for BP   rosuvastatin 40 MG tablet Commonly known as: CRESTOR Take 1 tablet Daily for Cholesterol   triamcinolone ointment 0.1 % Commonly known as: KENALOG Apply 1 application topically 2 (two) times daily.   vitamin B-12 500 MCG tablet Commonly known as: CYANOCOBALAMIN Take 500 mcg by mouth daily.   Vitamin D-3 125 MCG (5000 UT) Tabs Take 10,000 Units by mouth daily.  Allergies:  Allergies  Allergen Reactions  . Adalimumab Other (See Comments)    "blacked out", confused "blacked out"    Past Medical History, Surgical history, Social history, and Family History were reviewed and updated.  Review of Systems: Review of Systems  Constitutional: Negative.   HENT: Negative.   Eyes: Negative.   Respiratory: Negative.   Cardiovascular: Negative.   Gastrointestinal: Negative.   Genitourinary: Negative.   Musculoskeletal: Negative.   Skin: Negative.   Neurological: Negative.   Endo/Heme/Allergies: Negative.   Psychiatric/Behavioral: Negative.       Physical Exam:  weight is 180 lb (81.6 kg). His temporal temperature is 97.1 F (36.2 C) (abnormal). His blood pressure is 147/83 (abnormal) and his pulse is 54  (abnormal). His respiration is 18.   Wt Readings from Last 3 Encounters:  03/07/20 180 lb (81.6 kg)  02/22/20 181 lb 12.8 oz (82.5 kg)  12/08/19 183 lb 4 oz (83.1 kg)    Physical Exam Vitals reviewed.  HENT:     Head: Normocephalic and atraumatic.  Eyes:     Pupils: Pupils are equal, round, and reactive to light.  Cardiovascular:     Rate and Rhythm: Normal rate and regular rhythm.     Heart sounds: Normal heart sounds.  Pulmonary:     Effort: Pulmonary effort is normal.     Breath sounds: Normal breath sounds.  Abdominal:     General: Bowel sounds are normal.     Palpations: Abdomen is soft.  Musculoskeletal:        General: No tenderness or deformity. Normal range of motion.     Cervical back: Normal range of motion.  Lymphadenopathy:     Cervical: No cervical adenopathy.  Skin:    General: Skin is warm and dry.     Findings: No erythema or rash.  Neurological:     Mental Status: He is alert and oriented to person, place, and time.  Psychiatric:        Behavior: Behavior normal.        Thought Content: Thought content normal.        Judgment: Judgment normal.      Lab Results  Component Value Date   WBC 4.6 03/07/2020   HGB 13.4 03/07/2020   HCT 40.3 03/07/2020   MCV 95.7 03/07/2020   PLT 214 03/07/2020   Lab Results  Component Value Date   FERRITIN 137 08/06/2019   IRON 78 08/06/2019   TIBC 333 08/06/2019   UIBC 255 08/06/2019   IRONPCTSAT 23 08/06/2019   Lab Results  Component Value Date   RETICCTPCT 0.9 02/12/2018   RBC 4.21 (L) 03/07/2020   RETICCTABS 33,120 02/12/2018   Lab Results  Component Value Date   KPAFRELGTCHN 32.4 (H) 12/08/2019   LAMBDASER 13.6 12/08/2019   KAPLAMBRATIO 2.38 (H) 12/08/2019   Lab Results  Component Value Date   IGGSERUM 390 (L) 12/08/2019   IGA 33 (L) 12/08/2019   IGMSERUM 465 (H) 12/08/2019   Lab Results  Component Value Date   TOTALPROTELP 6.4 12/08/2019   ALBUMINELP 4.0 12/08/2019   A1GS 0.2 12/08/2019     A2GS 0.7 12/08/2019   BETS 0.8 12/08/2019   BETA2SER 0.2 02/18/2018   GAMS 0.7 12/08/2019   MSPIKE 0.3 (H) 12/08/2019   SPEI Comment 07/09/2018     Chemistry      Component Value Date/Time   NA 141 03/07/2020 0811   K 3.8 03/07/2020 0811   CL 105 03/07/2020 0811   CO2  26 03/07/2020 0811   BUN 16 03/07/2020 0811   CREATININE 1.23 03/07/2020 0811   CREATININE 1.14 02/22/2020 1132      Component Value Date/Time   CALCIUM 9.6 03/07/2020 0811   ALKPHOS 82 03/07/2020 0811   AST 18 03/07/2020 0811   ALT 11 03/07/2020 0811   BILITOT 0.9 03/07/2020 0811      Impression and Plan: Edwin Webster is a very pleasant 59 yo caucasian gentleman with Waldenstrom's macroglobulinemia.   Hopefully, we will not have to adjust the Imbruvica dose.  He is tolerating the 280 mg dose quite nicely.  He is doing go Adult nurse.  I am so jealous of this.  I know he will have a good time.  We will plan to get him back in 3 more months.    [name], MD 4/12/20219:13 AM

## 2020-03-08 LAB — IGG, IGA, IGM
IgA: 31 mg/dL — ABNORMAL LOW (ref 61–437)
IgG (Immunoglobin G), Serum: 404 mg/dL — ABNORMAL LOW (ref 603–1613)
IgM (Immunoglobulin M), Srm: 455 mg/dL — ABNORMAL HIGH (ref 20–172)

## 2020-03-08 LAB — KAPPA/LAMBDA LIGHT CHAINS
Kappa free light chain: 28.5 mg/L — ABNORMAL HIGH (ref 3.3–19.4)
Kappa, lambda light chain ratio: 2.5 — ABNORMAL HIGH (ref 0.26–1.65)
Lambda free light chains: 11.4 mg/L (ref 5.7–26.3)

## 2020-03-10 LAB — PROTEIN ELECTROPHORESIS, SERUM, WITH REFLEX
A/G Ratio: 1.4 (ref 0.7–1.7)
Albumin ELP: 3.9 g/dL (ref 2.9–4.4)
Alpha-1-Globulin: 0.3 g/dL (ref 0.0–0.4)
Alpha-2-Globulin: 0.7 g/dL (ref 0.4–1.0)
Beta Globulin: 1 g/dL (ref 0.7–1.3)
Gamma Globulin: 0.7 g/dL (ref 0.4–1.8)
Globulin, Total: 2.7 g/dL (ref 2.2–3.9)
M-Spike, %: 0.3 g/dL — ABNORMAL HIGH
SPEP Interpretation: 0
Total Protein ELP: 6.6 g/dL (ref 6.0–8.5)

## 2020-03-10 LAB — IMMUNOFIXATION REFLEX, SERUM
IgA: 33 mg/dL — ABNORMAL LOW (ref 61–437)
IgG (Immunoglobin G), Serum: 408 mg/dL — ABNORMAL LOW (ref 603–1613)
IgM (Immunoglobulin M), Srm: 472 mg/dL — ABNORMAL HIGH (ref 20–172)

## 2020-03-11 ENCOUNTER — Encounter: Payer: Self-pay | Admitting: *Deleted

## 2020-03-17 MED FILL — IMBRUVICA 280 MG TAB: 280 | 28 days supply | Qty: 28 | Fill #3

## 2020-03-21 ENCOUNTER — Telehealth: Payer: Self-pay | Admitting: *Deleted

## 2020-03-21 NOTE — Telephone Encounter (Signed)
Patient called and reported a low pulse rate of 43 at 7 AM and 44 at [facility] today. His blood pressure at noon was 166/--. Per Dr Melford Aase, leave off the Atenolol 100 mg today, increase fluids and keep a record of his BP readings. He states he feels weak and dizzy, but is improving. Per Dr Melford Aase, the patient should call in the morning and will need an office visit and EKG, if pulse remains low.  Patient is aware.

## 2020-03-21 NOTE — Progress Notes (Deleted)
   Subjective:    Patient ID: Edwin Webster, male    DOB: 06-06-1961, 59 y.o.   MRN: [id number]  HPI 59 y.o. WM with history of Waldenstrom's macroglobulinemia (followed by Dr. Marin Olp, on Rituxan/bendamustine), RA, depression/anxiety, HTN, chol presents with hypotension and sinus bradycardia.   He called yesterday with low pulse rate of 43 at 7 AM and 44 later that day. Marland Kitchen His blood pressure at noon was with a systolic of [id number]. Per Dr Melford Aase, he stopped his Atenolol 100 mg, increased fluids and keep a record of his BP readings. He states he feels weak and dizzy, but is improving.   He is on atenolol 100mg  and Benicar 40.   There were no vitals taken for this visit.  Medications   Current Outpatient Medications (Cardiovascular):  .  atenolol (TENORMIN) 100 MG tablet, Take 1 tablet every Morning  for BP .  olmesartan (BENICAR) 40 MG tablet, Take 1 tablet at Bedtime  for BP .  rosuvastatin (CRESTOR) 40 MG tablet, Take 1 tablet Daily for Cholesterol  Current Outpatient Medications (Respiratory):  .  fluticasone (FLONASE) 50 MCG/ACT nasal spray, Instill 2 sprays each Nares 2 x /day .  levocetirizine (XYZAL) 5 MG tablet, Take 1 tablet Daily for Allergies  Current Outpatient Medications (Analgesics):  .  aspirin EC 81 MG tablet, Take 81 mg daily by mouth. Marland Kitchen  ibuprofen (ADVIL,MOTRIN) 800 MG tablet, Take 1 tablet (800 mg total) by mouth daily as needed.  Current Outpatient Medications (Hematological):  .  folic acid (FOLVITE) 1 MG tablet, Take 1 tablet (1 mg total) by mouth daily. .  vitamin B-12 (CYANOCOBALAMIN) 500 MCG tablet, Take 500 mcg by mouth daily.  Current Outpatient Medications (Other):  Marland Kitchen  ALPRAZolam (XANAX) 1 MG tablet, Take 1/2 to 1 tablet 2 to 3 x /day ONLY if needed for Anxiety Attack(s) .  busPIRone (BUSPAR) 5 MG tablet, Take 1/2 to 1 tablet 3 x /day for Anxiety .  Cholecalciferol (VITAMIN D-3) 5000 UNITS TABS, Take 10,000 Units by mouth daily.  Marland Kitchen  escitalopram (LEXAPRO) 20  MG tablet, Take 1 tablet daily for Mood .  famciclovir (FAMVIR) 500 MG tablet, TAKE 1 TABLET BY MOUTH EVERY DAY .  famotidine (PEPCID) 40 MG tablet, Take 1 tabet Daily for Heartburn and Indigestion .  gabapentin (NEURONTIN) 300 MG capsule, TAKE 1 CAPSULE (300 MG TOTAL) BY MOUTH 3 (THREE) TIMES DAILY AS NEEDED. FOR SHINGLES PAIN .  IMBRUVICA 280 MG TABS, TAKE 1 TABLET (280 MG) BY MOUTH DAILY. Marland Kitchen  triamcinolone ointment (KENALOG) 0.1 %, Apply 1 application topically 2 (two) times daily.  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   Review of Systems     Objective:   Physical Exam        Assessment & Plan:

## 2020-03-22 ENCOUNTER — Ambulatory Visit: Payer: 59 | Admitting: Physician Assistant

## 2020-04-07 ENCOUNTER — Other Ambulatory Visit: Payer: Self-pay | Admitting: Physician Assistant

## 2020-04-07 ENCOUNTER — Telehealth: Payer: Self-pay | Admitting: *Deleted

## 2020-04-07 DIAGNOSIS — J0100 Acute maxillary sinusitis, unspecified: Secondary | ICD-10-CM

## 2020-04-07 MED ORDER — AZITHROMYCIN 250 MG PO TABS: ORAL_TABLET | ORAL | 1 refills | Status: AC

## 2020-04-07 NOTE — Progress Notes (Signed)
Having sinus pain x 1-2 weeks, has had negative COVID, will send in zpak Follow up if not better Future Appointments  Date Time Provider [place]  05/24/2020  8:45 AM Vicie Mutters, PA-C [facility] None  06/06/2020  8:45 AM [facility] None  06/06/2020  9:30 AM Marin Olp, Rudell Cobb, MD [facility] None  09/01/2020  2:00 PM Unk Pinto, MD [facility] None

## 2020-04-07 NOTE — Telephone Encounter (Signed)
Patient called and complained of green sinus drainage and sore throat, due to sinus drainage.  He complained of a sore throat. States he has both Covid vaccines and a recent negative Covid test. He is requesting a Z-pak be sent to [facility] in [place].

## 2020-04-11 ENCOUNTER — Other Ambulatory Visit: Payer: Self-pay | Admitting: Hematology & Oncology

## 2020-04-11 DIAGNOSIS — C880 Waldenstrom macroglobulinemia: Secondary | ICD-10-CM

## 2020-04-11 MED FILL — IMBRUVICA 280 MG TAB: 280 | 28 days supply | Qty: 28 | Fill #0

## 2020-05-10 MED FILL — IMBRUVICA 280 MG TAB: 280 | 28 days supply | Qty: 28 | Fill #1

## 2020-05-16 ENCOUNTER — Ambulatory Visit: Payer: 59 | Admitting: Physician Assistant

## 2020-05-16 ENCOUNTER — Other Ambulatory Visit: Payer: Self-pay

## 2020-05-16 ENCOUNTER — Other Ambulatory Visit: Payer: Self-pay | Admitting: Internal Medicine

## 2020-05-16 ENCOUNTER — Encounter: Payer: Self-pay | Admitting: Physician Assistant

## 2020-05-16 VITALS — BP 128/72 | HR 64 | Temp 97.7°F | Wt 177.0 lb

## 2020-05-16 DIAGNOSIS — I10 Essential (primary) hypertension: Secondary | ICD-10-CM | POA: Diagnosis not present

## 2020-05-16 DIAGNOSIS — R001 Bradycardia, unspecified: Secondary | ICD-10-CM | POA: Diagnosis not present

## 2020-05-16 MED ORDER — OLMESARTAN MEDOXOMIL 40 MG PO TABS: ORAL_TABLET | ORAL | 3 refills | Status: DC

## 2020-05-16 NOTE — Progress Notes (Signed)
Subjective:    Patient ID: Edwin Webster, male    DOB: Jun 13, 1961, 59 y.o.   MRN: [id number]  HPI 59 y.o. WM with history of chol, HTN, RA, iron def, depression, WAldenstrom's macrogobulinemia presents with bradycardia.    He contacted the office on 04/26 with heart rate of 43 and 44 with elevated BP with weakness and dizziness. His atenolol was stopped and he had an appointment for 04/27 but no showed that. He presents today for the bradycardia.   He is on atenolol 100mg  at night, there was miscommunication and he has continuing the atenolol and stopped the benicar.  He states he has had his HR 48 in the AM, feels that during the time he can not get out of the bed. It will increase during the day.  No chest pain, no shortness of breath. No palpitations.  He has ptosis left eye. CXR 12/2018 shows emphysema.   He had a normal sleep study 10/2017 Stress echo normal 2011  Lab Results  Component Value Date   TSH 2.45 02/22/2020   Lab Results  Component Value Date   WBC 4.6 03/07/2020   HGB 13.4 03/07/2020   HCT 40.3 03/07/2020   MCV 95.7 03/07/2020   PLT 214 03/07/2020    Blood pressure 128/72, pulse 64, temperature 97.7 F (36.5 C), weight 177 lb (80.3 kg), SpO2 96 %.  Medications   Current Outpatient Medications (Cardiovascular):  .  rosuvastatin (CRESTOR) 40 MG tablet, Take 1 tablet Daily for Cholesterol .  olmesartan (BENICAR) 40 MG tablet, Take 1 tablet at Bedtime  for BP  Current Outpatient Medications (Respiratory):  .  fluticasone (FLONASE) 50 MCG/ACT nasal spray, Instill 2 sprays each Nares 2 x /day .  levocetirizine (XYZAL) 5 MG tablet, Take 1 tablet Daily for Allergies  Current Outpatient Medications (Analgesics):  .  aspirin EC 81 MG tablet, Take 81 mg daily by mouth. Marland Kitchen  ibuprofen (ADVIL,MOTRIN) 800 MG tablet, Take 1 tablet (800 mg total) by mouth daily as needed.  Current Outpatient Medications (Hematological):  .  folic acid (FOLVITE) 1 MG tablet, Take 1  tablet (1 mg total) by mouth daily. .  vitamin B-12 (CYANOCOBALAMIN) 500 MCG tablet, Take 500 mcg by mouth daily.  Current Outpatient Medications (Other):  Marland Kitchen  ALPRAZolam (XANAX) 1 MG tablet, Take 1/2 to 1 tablet 2 to 3 x /day ONLY if needed for Anxiety Attack(s) .  busPIRone (BUSPAR) 5 MG tablet, Take 1/2 to 1 tablet 3 x /day for Anxiety .  Cholecalciferol (VITAMIN D-3) 5000 UNITS TABS, Take 10,000 Units by mouth daily.  Marland Kitchen  escitalopram (LEXAPRO) 20 MG tablet, Take 1 tablet Daily for Mood .  famciclovir (FAMVIR) 500 MG tablet, TAKE 1 TABLET BY MOUTH EVERY DAY .  famotidine (PEPCID) 40 MG tablet, Take 1 tabet Daily for Heartburn and Indigestion .  gabapentin (NEURONTIN) 300 MG capsule, TAKE 1 CAPSULE (300 MG TOTAL) BY MOUTH 3 (THREE) TIMES DAILY AS NEEDED. FOR SHINGLES PAIN .  IMBRUVICA 280 MG TABS, TAKE 1 TABLET (280 MG) BY MOUTH DAILY. Marland Kitchen  triamcinolone ointment (KENALOG) 0.1 %, Apply 1 application topically 2 (two) times daily.  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   Review of Systems  Constitutional: Positive for fatigue. Negative for activity change, appetite  change, chills, diaphoresis, fever and unexpected weight change.  HENT: Positive for postnasal drip. Negative for congestion, ear pain, hearing loss, rhinorrhea, sinus pressure, sinus pain, sore throat, tinnitus, trouble swallowing and voice change.   Respiratory: Negative.  Negative for shortness of breath.   Cardiovascular: Negative.  Negative for chest pain, palpitations and leg swelling.  Gastrointestinal: Negative.  Negative for diarrhea.  Genitourinary: Negative.   Musculoskeletal: Negative.   Negative for neck pain.  Neurological: Positive for dizziness. Negative for tremors, seizures, syncope, facial asymmetry, speech difficulty, weakness, light-headedness, numbness and headaches.  Psychiatric/Behavioral: Negative.  Negative for hallucinations.       Objective:   Physical Exam General Appearance: Well nourished, in no apparent distress. Eyes: PERRLA, EOMs, conjunctiva no swelling or erythema Sinuses: No Frontal/maxillary tenderness ENT/Mouth: Ext aud canals clear, TMs without erythema, bulging. No erythema, swelling, or exudate on post pharynx.  Tonsils not swollen or erythematous. Hearing normal.  Neck: Supple, thyroid normal.  Respiratory: Respiratory effort normal, BS equal bilaterally without rales, rhonchi, wheezing or stridor.  Cardio: RRR with no MRGs. Brisk peripheral pulses without edema.  Abdomen: Soft, + BS.  Non tender, no guarding, rebound, hernias, masses. Lymphatics: Non tender without lymphadenopathy.  Musculoskeletal: Full ROM, 5/5 strength, Normal gait Skin: Warm, dry without rashes,  ecchymosis.  Neuro: Cranial nerves intact. No cerebellar symptoms.  Psych: Awake and oriented X 3, normal affect, Insight and Judgment appropriate.   Future Appointments  Date Time Provider [place]  06/01/2020 11:15 AM Vicie Mutters, PA-C [facility] None  06/22/2020  8:45 AM [facility] None  06/22/2020  9:00 AM Ennever, Rudell Cobb, MD [facility] None  09/01/2020  2:00 PM Unk Pinto, MD [facility] None         Assessment & Plan:  Maleke was seen today for bradycardia.  Diagnoses and all orders for this visit:  Essential hypertension with sinus brady -     olmesartan (BENICAR) 40 MG tablet; Take 1 tablet at Bedtime  for BP -     EKG 12-Lead- WNL sinus brady rate 57, No ST changes, no AV blocks.  Stop the atenolol, get back on the benicar 40 mg - follow up 2 weeks, call if BP elevated. May need to add on norvasc Go to the ER if any chest pain,  shortness of breath, nausea, dizziness, severe HA, changes vision/speech

## 2020-05-16 NOTE — Patient Instructions (Addendum)
Start the benicar 40 mg and stop the atenolol Follow up 2 weeks Monitor your blood pressure and heart rate.   HYPERTENSION INFORMATION  Monitor your blood pressure at home, please keep a record and bring that in with you to your next office visit.   Go to the ER if any CP, SOB, nausea, dizziness, severe HA, changes vision/speech  Testing/Procedures: HOW TO TAKE YOUR BLOOD PRESSURE:  Rest 5 minutes before taking your blood pressure.  Don't smoke or drink caffeinated beverages for at least 30 minutes before.  Take your blood pressure before (not after) you eat.  Sit comfortably with your back supported and both feet on the floor (don't cross your legs).  Elevate your arm to heart level on a table or a desk.  Use the proper sized cuff. It should fit smoothly and snugly around your bare upper arm. There should be enough room to slip a fingertip under the cuff. The bottom edge of the cuff should be 1 inch above the crease of the elbow.  Due to a recent study, SPRINT, we have changed our goal for the systolic or top blood pressure number. Ideally we want your top number at 120.  In the [facility] Trial, 5000 people were randomized to a goal BP of 120 and 5000 people were randomized to a goal BP of less than 140. The patients with the goal BP at 120 had LESS DEMENTIA, LESS HEART ATTACKS, AND LESS STROKES, AS WELL AS OVERALL DECREASED MORTALITY OR DEATH RATE.   There was another study that showed taking your blood pressure medications at night decrease cardiovascular events.  However if you are on a fluid pill, please take this in the morning.   If you are willing, our goal BP is the top number of 120.  Your most recent BP: BP: 128/72   Take your medications faithfully as instructed. Maintain a healthy weight. Get at least 150 minutes of aerobic exercise per week. Minimize salt intake. Minimize alcohol intake  DASH Eating Plan DASH stands for "Dietary Approaches to Stop Hypertension."  The DASH eating plan is a healthy eating plan that has been shown to reduce high blood pressure (hypertension). Additional health benefits may include reducing the risk of type 2 diabetes mellitus, heart disease, and stroke. The DASH eating plan may also help with weight loss. WHAT DO I NEED TO KNOW ABOUT THE DASH EATING PLAN? For the DASH eating plan, you will follow these general guidelines:  Choose foods with a percent daily value for sodium of less than 5% (as listed on the food label).  Use salt-free seasonings or herbs instead of table salt or sea salt.  Check with your health care provider or pharmacist before using salt substitutes.  Eat lower-sodium products, often labeled as "lower sodium" or "no salt added."  Eat fresh foods.  Eat more vegetables, fruits, and low-fat dairy products.  Choose whole grains. Look for the word "whole" as the first word in the ingredient list.  Choose fish and skinless chicken or [place] more often than red meat. Limit fish, poultry, and meat to 6 oz (170 g) each day.  Limit sweets, desserts, sugars, and sugary drinks.  Choose heart-healthy fats.  Limit cheese to 1 oz (28 g) per day.  Eat more home-cooked food and less restaurant, buffet, and fast food.  Limit fried foods.  Cook foods using methods other than frying.  Limit canned vegetables. If you do use them, rinse them well to decrease the sodium.  When eating at a restaurant, ask that your food be prepared with less salt, or no salt if possible. WHAT FOODS CAN I EAT? Seek help from a dietitian for individual calorie needs. Grains Whole grain or whole wheat bread. Armenti rice. Whole grain or whole wheat pasta. Quinoa, bulgur, and whole grain cereals. Low-sodium cereals. Corn or whole wheat flour tortillas. Whole grain cornbread. Whole grain crackers. Low-sodium crackers. Vegetables Fresh or frozen vegetables (raw, steamed, roasted, or grilled). Low-sodium or reduced-sodium tomato and  vegetable juices. Low-sodium or reduced-sodium tomato sauce and paste. Low-sodium or reduced-sodium canned vegetables.  Fruits All fresh, canned (in natural juice), or frozen fruits. Meat and Other Protein Products Ground beef (85% or leaner), grass-fed beef, or beef trimmed of fat. Skinless chicken or [place]. Ground chicken or [place]. Pork trimmed of fat. All fish and seafood. Eggs. Dried beans, peas, or lentils. Unsalted nuts and seeds. Unsalted canned beans. Dairy Low-fat dairy products, such as skim or 1% milk, 2% or reduced-fat cheeses, low-fat ricotta or cottage cheese, or plain low-fat yogurt. Low-sodium or reduced-sodium cheeses. Fats and Oils Tub margarines without trans fats. Light or reduced-fat mayonnaise and salad dressings (reduced sodium). Avocado. Safflower, olive, or canola oils. Natural peanut or almond butter. Other Unsalted popcorn and pretzels. The items listed above may not be a complete list of recommended foods or beverages. Contact your dietitian for more options. WHAT FOODS ARE NOT RECOMMENDED? Grains White bread. White pasta. White rice. Refined cornbread. Bagels and croissants. Crackers that contain trans fat. Vegetables Creamed or fried vegetables. Vegetables in a cheese sauce. Regular canned vegetables. Regular canned tomato sauce and paste. Regular tomato and vegetable juices. Fruits Dried fruits. Canned fruit in light or heavy syrup. Fruit juice. Meat and Other Protein Products Fatty cuts of meat. Ribs, chicken wings, bacon, sausage, bologna, salami, chitterlings, fatback, hot dogs, bratwurst, and packaged luncheon meats. Salted nuts and seeds. Canned beans with salt. Dairy Whole or 2% milk, cream, half-and-half, and cream cheese. Whole-fat or sweetened yogurt. Full-fat cheeses or blue cheese. Nondairy creamers and whipped toppings. Processed cheese, cheese spreads, or cheese curds. Condiments Onion and garlic salt, seasoned salt, table salt, and sea salt.  Canned and packaged gravies. Worcestershire sauce. Tartar sauce. Barbecue sauce. Teriyaki sauce. Soy sauce, including reduced sodium. Steak sauce. Fish sauce. Oyster sauce. Cocktail sauce. Horseradish. Ketchup and mustard. Meat flavorings and tenderizers. Bouillon cubes. Hot sauce. Tabasco sauce. Marinades. Taco seasonings. Relishes. Fats and Oils Butter, stick margarine, lard, shortening, ghee, and bacon fat. Coconut, palm kernel, or palm oils. Regular salad dressings. Other Pickles and olives. Salted popcorn and pretzels. The items listed above may not be a complete list of foods and beverages to avoid. Contact your dietitian for more information. WHERE CAN I FIND MORE INFORMATION? National Heart, Lung, and Blood Institute: travelstabloid.com Document Released: 11/01/2011 Document Revised: 03/29/2014 Document Reviewed: 09/16/2013 [facility] Patient Information 2015 [place], [place]. This information is not intended to replace advice given to you by your health care provider. Make sure you discuss any questions you have with your health care provider.     Bradycardia, Adult Bradycardia is a slower-than-normal heartbeat. A normal resting heart rate for an adult ranges from 60 to 100 beats per minute. With bradycardia, the resting heart rate is less than 60 beats per minute. Bradycardia can prevent enough oxygen from reaching certain areas of your body when you are active. It can be serious if it keeps enough oxygen from reaching your brain and other parts of your body. Bradycardia  is not a problem for everyone. For some healthy adults, a slow resting heart rate is normal. What are the causes? This condition may be caused by:  A problem with the heart, including: ? A problem with the heart's electrical system, such as a heart block. With a heart block, electrical signals between the chambers of the heart are partially or completely blocked, so they are not able to  work as they should. ? A problem with the heart's natural pacemaker (sinus node). ? Heart disease. ? A heart attack. ? Heart damage. ? Lyme disease. ? A heart infection. ? A heart condition that is present at birth (congenital heart defect).  Certain medicines that treat heart conditions.  Certain conditions, such as hypothyroidism and obstructive sleep apnea.  Problems with the balance of chemicals and other substances, like potassium, in the blood.  Trauma.  Radiation therapy. What increases the risk? You are more likely to develop this condition if you:  Are age 69 or older.  Have high blood pressure (hypertension), high cholesterol (hyperlipidemia), or diabetes.  Drink heavily, use tobacco or nicotine products, or use drugs. What are the signs or symptoms? Symptoms of this condition include:  Light-headedness.  Feeling faint or fainting.  Fatigue and weakness.  Trouble with activity or exercise.  Shortness of breath.  Chest pain (angina).  Drowsiness.  Confusion.  Dizziness. How is this diagnosed? This condition may be diagnosed based on:  Your symptoms.  Your medical history.  A physical exam. During the exam, your health care provider will listen to your heartbeat and check your pulse. To confirm the diagnosis, your health care provider may order tests, such as:  Blood tests.  An electrocardiogram (ECG). This test records the heart's electrical activity. The test can show how fast your heart is beating and whether the heartbeat is steady.  A test in which you wear a portable device (event recorder or Holter monitor) to record your heart's electrical activity while you go about your day.  Anexercise test. How is this treated? Treatment for this condition depends on the cause of the condition and how severe your symptoms are. Treatment may involve:  Treatment of the underlying condition.  Changing your medicines or how much medicine you  take.  Having a small, battery-operated device called a pacemaker implanted under the skin. When bradycardia occurs, this device can be used to increase your heart rate and help your heart beat in a regular rhythm. Follow these instructions at home: Lifestyle   Manage any health conditions that contribute to bradycardia as told by your health care provider.  Follow a heart-healthy diet. A nutrition specialist (dietitian) can help educate you about healthy food options and changes.  Follow an exercise program that is approved by your health care provider.  Maintain a healthy weight.  Try to reduce or manage your stress, such as with yoga or meditation. If you need help reducing stress, ask your health care provider.  Do not use any products that contain nicotine or tobacco, such as cigarettes, e-cigarettes, and chewing tobacco. If you need help quitting, ask your health care provider.  Do not use illegal drugs.  Limit alcohol intake to no more than 1 drink a day for nonpregnant women and 2 drinks a day for men. Be aware of how much alcohol is in your drink. In the [place] one drink equals one 12 oz bottle of beer (355 mL), one 5 oz glass of wine (148 mL), or one 1 oz glass  of hard liquor (44 mL). General instructions  Take over-the-counter and prescription medicines only as told by your health care provider.  Keep all follow-up visits as told by your health care provider. This is important. How is this prevented? In some cases, bradycardia may be prevented by:  Treating underlying medical problems.  Stopping behaviors or medicines that can trigger the condition. Contact a health care provider if you:  Feel light-headed or dizzy.  Almost faint.  Feel weak or are easily fatigued during physical activity.  Experience confusion or have memory problems. Get help right away if:  You faint.  You have: ? An irregular heartbeat (palpitations). ? Chest pain. ? Trouble  breathing. Summary  Bradycardia is a slower-than-normal heartbeat. With bradycardia, the resting heart rate is less than 60 beats per minute.  Treatment for this condition depends on the cause.  Manage any health conditions that contribute to bradycardia as told by your health care provider.  Do not use any products that contain nicotine or tobacco, such as cigarettes, e-cigarettes, and chewing tobacco, and limit alcohol intake.  Keep all follow-up visits as told by your health care provider. This is important. This information is not intended to replace advice given to you by your health care provider. Make sure you discuss any questions you have with your health care provider. Document Revised: 05/26/2018 Document Reviewed: 04/23/2018 Elsevier Patient Education  [place].

## 2020-05-24 ENCOUNTER — Ambulatory Visit: Payer: 59 | Admitting: Physician Assistant

## 2020-05-24 ENCOUNTER — Ambulatory Visit: Payer: 59 | Admitting: Adult Health Nurse Practitioner

## 2020-05-31 NOTE — Progress Notes (Signed)
Subjective:    Patient ID: Edwin Webster, male    DOB: 04-13-1961, 59 y.o.   MRN: [id number]  HPI 59 y.o. WM with history of chol, HTN, RA, iron def, depression, WAldenstrom's macrogobulinemia presents for follow up for bradycardia.   His atenolol was stopped last visit and he was placed on benicar 40 mg.  He brings in a monitor of his BP and HR, it has improved from 40's to 50's. He has fatigue all the time, he is on imburvica treatment for macroglobulinemia and has assumed it was that. He states his low HR is worse in the AM and it will make it hard for him to get out of bed. Lowest 53 in the morning.   EKG last visit showed sinus brady, no ST changes, no AV blocks No chest pain, no shortness of breath. No palpitations.  He had a normal sleep study 10/2017 Stress echo normal 2011  Lab Results  Component Value Date   TSH 2.45 02/22/2020   Lab Results  Component Value Date   WBC 4.6 03/07/2020   HGB 13.4 03/07/2020   HCT 40.3 03/07/2020   MCV 95.7 03/07/2020   PLT 214 03/07/2020    Blood pressure 122/76, pulse 68, temperature (!) 97 F (36.1 C), resp. rate 16, height 5' 9.5" (1.765 m), weight 176 lb 3.2 oz (79.9 kg), SpO2 96 %.  Medications   Current Outpatient Medications (Cardiovascular):  .  rosuvastatin (CRESTOR) 40 MG tablet, Take 1 tablet Daily for Cholesterol .  olmesartan (BENICAR) 40 MG tablet, Take 1 tablet at Bedtime  for BP (Patient not taking: Reported on 06/01/2020)  Current Outpatient Medications (Respiratory):  .  fluticasone (FLONASE) 50 MCG/ACT nasal spray, Instill 2 sprays each Nares 2 x /day .  levocetirizine (XYZAL) 5 MG tablet, Take 1 tablet Daily for Allergies  Current Outpatient Medications (Analgesics):  .  aspirin EC 81 MG tablet, Take 81 mg daily by mouth. Marland Kitchen  ibuprofen (ADVIL,MOTRIN) 800 MG tablet, Take 1 tablet (800 mg total) by mouth daily as needed.  Current Outpatient Medications (Hematological):  .  folic acid (FOLVITE) 1 MG tablet, Take 1  tablet (1 mg total) by mouth daily. .  vitamin B-12 (CYANOCOBALAMIN) 500 MCG tablet, Take 500 mcg by mouth daily.  Current Outpatient Medications (Other):  Marland Kitchen  ALPRAZolam (XANAX) 1 MG tablet, Take 1/2 to 1 tablet 2 to 3 x /day ONLY if needed for Anxiety Attack(s) .  busPIRone (BUSPAR) 5 MG tablet, Take 1/2 to 1 tablet 3 x /day for Anxiety .  Cholecalciferol (VITAMIN D-3) 5000 UNITS TABS, Take 10,000 Units by mouth daily.  Marland Kitchen  escitalopram (LEXAPRO) 20 MG tablet, Take 1 tablet Daily for Mood .  famciclovir (FAMVIR) 500 MG tablet, TAKE 1 TABLET BY MOUTH EVERY DAY .  famotidine (PEPCID) 40 MG tablet, Take 1 tabet Daily for Heartburn and Indigestion .  gabapentin (NEURONTIN) 300 MG capsule, TAKE 1 CAPSULE (300 MG TOTAL) BY MOUTH 3 (THREE) TIMES DAILY AS NEEDED. FOR SHINGLES PAIN .  IMBRUVICA 280 MG TABS, TAKE 1 TABLET (280 MG) BY MOUTH DAILY. Marland Kitchen  triamcinolone ointment (KENALOG) 0.1 %, Apply 1 application topically 2 (two) times daily.  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   Review of Systems  Constitutional: Positive for  fatigue. Negative for activity change, appetite change, chills, diaphoresis, fever and unexpected weight change.  HENT: Negative for congestion, ear pain, hearing loss, postnasal drip, rhinorrhea, sinus pressure, sinus pain, sore throat, tinnitus, trouble swallowing and voice change.   Respiratory: Negative.  Negative for shortness of breath.   Cardiovascular: Negative.  Negative for chest pain, palpitations and leg swelling.  Gastrointestinal: Negative.  Negative for diarrhea.  Genitourinary: Negative.   Musculoskeletal: Negative.  Negative for neck  pain.  Neurological: Positive for dizziness. Negative for tremors, seizures, syncope, facial asymmetry, speech difficulty, weakness, light-headedness, numbness and headaches.  Psychiatric/Behavioral: Negative.  Negative for hallucinations.       Objective:   Physical Exam General Appearance: Well nourished, in no apparent distress. Eyes: PERRLA, EOMs, conjunctiva no swelling or erythema Sinuses: No Frontal/maxillary tenderness ENT/Mouth: Ext aud canals clear, TMs without erythema, bulging. No erythema, swelling, or exudate on post pharynx.  Tonsils not swollen or erythematous. Hearing normal.  Neck: Supple, thyroid normal.  Respiratory: Respiratory effort normal, BS equal bilaterally without rales, rhonchi, wheezing or stridor.  Cardio: RRR with no MRGs. Brisk peripheral pulses without edema.  Abdomen: Soft, + BS.  Non tender, no guarding, rebound, hernias, masses. Lymphatics: Non tender without lymphadenopathy.  Musculoskeletal: Full ROM, 5/5 strength, Normal gait Skin: Senile purpura bilateral legs. Warm, dry without rashes,  ecchymosis.  Neuro: Cranial nerves intact. No cerebellar symptoms.  Psych: Awake and oriented X 3, normal affect, Insight and Judgment appropriate.        Assessment & Plan:   Sinus bradycardia with fatigue -     LONG TERM MONITOR (3-14 DAYS); Future - get holter monitor rule out SSS - monitor HR - Go to the ER if any chest pain, shortness of breath, nausea, dizziness, severe HA, changes vision/speech - no tachy symptoms Normal sleep study Normal stress test 2011- no CP, no exertional SOB  Essential hypertension Controlled on just benicar- continue Off atenolol

## 2020-06-01 ENCOUNTER — Ambulatory Visit (INDEPENDENT_AMBULATORY_CARE_PROVIDER_SITE_OTHER): Payer: 59 | Admitting: Physician Assistant

## 2020-06-01 ENCOUNTER — Other Ambulatory Visit: Payer: Self-pay

## 2020-06-01 VITALS — BP 122/76 | HR 68 | Temp 97.0°F | Resp 16 | Ht 69.5 in | Wt 176.2 lb

## 2020-06-01 DIAGNOSIS — R5383 Other fatigue: Secondary | ICD-10-CM | POA: Diagnosis not present

## 2020-06-01 DIAGNOSIS — R001 Bradycardia, unspecified: Secondary | ICD-10-CM

## 2020-06-01 DIAGNOSIS — I10 Essential (primary) hypertension: Secondary | ICD-10-CM

## 2020-06-01 NOTE — Patient Instructions (Signed)
Keep HR in the morning and with exertion check your heart rate.   Go to the ER if any chest pain, shortness of breath, nausea, dizziness, severe HA, changes vision/speech   Sick Sinus Syndrome Sick sinus syndrome is a group of conditions that affect heart rhythm and the rate at which the heart beats (heart rate). When you have sick sinus syndrome, your heart rate may be too fast (tachycardia) or too slow (bradycardia), or it may switch between fast and slow. What are the causes?  Your heartbeat is controlled by a structure in the heart called the sinoatrial (SA) node. Sick sinus syndrome happens when the SA node does not work properly (SA node dysfunction). Damage to the SA node is the most common cause of sick sinus syndrome. What increases the risk? You are more likely to develop this condition if you:  Are 61 years of age or older.  Have a family history of sick sinus syndrome.  Have had a heart attack or heart surgery.  Have sleep apnea.  Have coronary artery disease.  Have inflammation of the heart muscle (myocarditis) or the sac that surrounds the heart (pericarditis).  Use medicines such as beta blockers, some calcium channel blockers, or digoxin.  Have an abnormal level of thyroid hormone or electrolytes.  Have high blood pressure (hypertension).  Have had infections that affect the heart, like rheumatic fever or Lyme's disease.  Were born with heart defects (congenital heart disease). What are the signs or symptoms? Symptoms of this condition include:  Fainting or feeling like you are going to faint.  Chest pain.  Shortness of breath.  Dizziness.  Feeling like your heart skips beats.  Feeling like your heart beats very quickly.  Tiredness.  Fatigue.  Confusion. Some people with this condition do not have any symptoms. Most people have few symptoms or very mild symptoms. How is this diagnosed? This condition may be diagnosed with:  A test that  measures electrical activity in the heart (electrocardiogram, orECG).  A test in which you wear a device called a Holter monitor for 1-2 days. The device will record your heart's electrical signals.  A device that records the electrical activity of your heart over a longer period of time (implantable loop recorder).  A test to look at the electrical system of your heart (electrophysiology study).  Blood tests. How is this treated? This condition may be treated by:  Having surgery to put a small, electrical device in your chest to correct your heartbeat (pacemaker).  Taking medicines to keep your heart from beating too quickly.  Stopping your use of certain medicines as told by your health care provider.  Having treatment for any underlying conditions. If the syndrome is not causing any symptoms, you may not need treatment. Follow these instructions at home: Eating and drinking   Eat a heart-healthy diet that includes fruits, vegetables, whole grains, low-fat dairy products, and lean proteins like poultry and eggs.  Do not drink alcohol if: ? Your health care provider tells you not to drink. ? You are pregnant, may be pregnant, or are planning to become pregnant.  If you drink alcohol: ? Limit how much you use to:  0-1 drink a day for women.  0-2 drinks a day for men. ? Be aware of how much alcohol is in your drink. In the [place] one drink equals one 12 oz bottle of beer (355 mL), one 5 oz glass of wine (148 mL), or one 1 oz glass of  hard liquor (44 mL). General instructions  Take over-the-counter and prescription medicines only as told by your health care provider.  Stay active and stay at a healthy weight. Ask your health care provider what level of activity is safe for you.  Do not use any products that contain nicotine or tobacco, such as cigarettes, e-cigarettes, and chewing tobacco. If you need help quitting, ask your health care provider.  Keep all follow-up visits  as told by your health care provider. This is important. Contact a health care provider if you:  Have a cough that does not go away.  Have swelling in your feet or ankles.  Feel short of breath with activity.  Feel fatigued. Get help right away if you:  Have chest pain or difficulty breathing.  Suddenly have weakness, numbness, or loss of movement on one side of your body.  Suddenly become very confused.  Suddenly lose the ability to speak or understand speech.  Feel like your heart is skipping beats.  Feel like your heart is beating very quickly.  Faint or feel like you are going to faint. These symptoms may represent a serious problem that is an emergency. Do not wait to see if the symptoms will go away. Get medical help right away. Call your local emergency services (911 in the [place] Do not drive yourself to the hospital. Summary  When you have sick sinus syndrome, your heart rate may be too fast (tachycardia) or too slow (bradycardia), or it may switch between fast and slow.  Treatment for this syndrome may include taking medicines and having a pacemaker placed in your chest.  Stay active and stay at a healthy weight. Ask your health care provider what level of activity is safe for you.  If you have chest pain or difficulty breathing, get help right away. Do not drive yourself to the hospital. This information is not intended to replace advice given to you by your health care provider. Make sure you discuss any questions you have with your health care provider. Document Revised: 03/06/2019 Document Reviewed: 08/07/2018 Elsevier Patient Education  2020 [employer].   Ambulatory Cardiac Monitoring An ambulatory cardiac monitor is a small recording device that is used to detect abnormal heart rhythms (arrhythmias). Most monitors are connected by wires to flat, sticky disks (electrodes) that are then attached to your chest. You may need to wear a monitor if you have had  symptoms such as:  Fast heartbeats (palpitations).  Dizziness.  Fainting or light-headedness.  Unexplained weakness.  Shortness of breath. There are several types of monitors. Some common monitors include:  Holter monitor. This records your heart rhythm continuously, usually for 24-48 hours.  Event (episodic) monitor. This monitor has a symptoms button, and when pushed, it will begin recording. You need to activate this monitor to record when you have a heart-related symptom.  Automatic detection monitor. This monitor will begin recording when it detects an abnormal heartbeat. What are the risks? Generally, these devices are safe to use. However, it is possible that the skin under the electrodes will become irritated. How to prepare for monitoring Your health care provider will prepare your chest for the electrode placement and show you how to use the monitor.  Do not apply lotions to your chest before monitoring.  Follow directions on how to care for the monitor, and how to return the monitor when the testing period is complete. How to use your cardiac monitor  Follow directions about how long to wear the  monitor, and if you can take the monitor off in order to shower or bathe. ? Do not let the monitor get wet. ? Do not bathe, swim, or use a hot tub while wearing the monitor.  Keep your skin clean. Do not put body lotion or moisturizer on your chest.  Change the electrodes as told by your health care provider, or any time they stop sticking to your skin. You may need to use medical tape to keep them on.  Try to put the electrodes in slightly different places on your chest to help prevent skin irritation. Follow directions from your health care provider about where to place the electrodes.  Make sure the monitor is safely clipped to your clothing or in a location close to your body as recommended by your health care provider.  If your monitor has a symptoms button, press the  button to mark an event as soon as you feel a heart-related symptom, such as: ? Dizziness. ? Weakness. ? Light-headedness. ? Palpitations. ? Thumping or pounding in your chest. ? Shortness of breath. ? Unexplained weakness.  Keep a diary of your activities, such as walking, doing chores, and taking medicine. It is very important to note what you were doing when you pushed the button to record your symptoms. This will help your health care provider determine what might be contributing to your symptoms.  Send the recorded information as recommended by your health care provider. It may take some time for your health care provider to process the results.  Change the batteries as told by your health care provider.  Keep electronic devices away from your monitor. These include: ? Tablets. ? MP3 players. ? Cell phones.  While wearing your monitor you should avoid: ? Electric blankets. ? Armed forces operational officer. ? Electric toothbrushes. ? Microwave ovens. ? Magnets. ? Metal detectors. Get help right away if:  You have chest pain.  You have shortness of breath or extreme difficulty breathing.  You develop a very fast heartbeat that does not get better.  You develop dizziness that does not go away.  You faint or constantly feel like you are about to faint. Summary  An ambulatory cardiac monitor is a small recording device that is used to detect abnormal heart rhythms (arrhythmias).  Make sure you understand how to send the information from the monitor to your health care provider.  It is important to press the button on the monitor when you have any heart-related symptoms.  Keep a diary of your activities, such as walking, doing chores, and taking medicine. It is very important to note what you were doing when you pushed the button to record your symptoms. This will help your health care provider learn what might be causing your symptoms. This information is not intended to replace  advice given to you by your health care provider. Make sure you discuss any questions you have with your health care provider. Document Revised: 10/25/2017 Document Reviewed: 10/27/2016 Elsevier Patient Education  2020 [employer].

## 2020-06-06 ENCOUNTER — Ambulatory Visit: Payer: 59 | Admitting: Hematology & Oncology

## 2020-06-06 ENCOUNTER — Other Ambulatory Visit: Payer: 59

## 2020-06-06 ENCOUNTER — Ambulatory Visit (INDEPENDENT_AMBULATORY_CARE_PROVIDER_SITE_OTHER): Payer: 59

## 2020-06-06 DIAGNOSIS — R001 Bradycardia, unspecified: Secondary | ICD-10-CM

## 2020-06-06 DIAGNOSIS — R5383 Other fatigue: Secondary | ICD-10-CM | POA: Diagnosis not present

## 2020-06-07 MED FILL — IMBRUVICA 280 MG TAB: 280 | 28 days supply | Qty: 28 | Fill #2

## 2020-06-22 ENCOUNTER — Telehealth: Payer: Self-pay | Admitting: Hematology & Oncology

## 2020-06-22 ENCOUNTER — Inpatient Hospital Stay: Payer: 59 | Attending: Hematology & Oncology

## 2020-06-22 ENCOUNTER — Encounter: Payer: Self-pay | Admitting: Hematology & Oncology

## 2020-06-22 ENCOUNTER — Encounter: Payer: Self-pay | Admitting: *Deleted

## 2020-06-22 ENCOUNTER — Other Ambulatory Visit: Payer: Self-pay

## 2020-06-22 ENCOUNTER — Inpatient Hospital Stay (HOSPITAL_BASED_OUTPATIENT_CLINIC_OR_DEPARTMENT_OTHER): Payer: 59 | Admitting: Hematology & Oncology

## 2020-06-22 VITALS — BP 124/72 | HR 50 | Temp 98.2°F | Resp 18 | Wt 179.8 lb

## 2020-06-22 DIAGNOSIS — C880 Waldenstrom macroglobulinemia: Secondary | ICD-10-CM | POA: Diagnosis not present

## 2020-06-22 DIAGNOSIS — R001 Bradycardia, unspecified: Secondary | ICD-10-CM | POA: Diagnosis not present

## 2020-06-22 DIAGNOSIS — D509 Iron deficiency anemia, unspecified: Secondary | ICD-10-CM | POA: Insufficient documentation

## 2020-06-22 LAB — CMP (CANCER CENTER ONLY)
ALT: 37 U/L (ref 0–44)
AST: 31 U/L (ref 15–41)
Albumin: 4.6 g/dL (ref 3.5–5.0)
Alkaline Phosphatase: 85 U/L (ref 38–126)
Anion gap: 7 (ref 5–15)
BUN: 13 mg/dL (ref 8–23)
CO2: 29 mmol/L (ref 22–32)
Calcium: 9.9 mg/dL (ref 8.9–10.3)
Chloride: 105 mmol/L (ref 98–111)
Creatinine: 1.25 mg/dL — ABNORMAL HIGH (ref 0.61–1.24)
GFR, Est AFR Am: >60 mL/min (ref >60)
GFR, Estimated: >60 mL/min (ref >60)
Glucose, Bld: 95 mg/dL (ref 70–99)
Potassium: 4.1 mmol/L (ref 3.5–5.1)
Sodium: 141 mmol/L (ref 135–145)
Total Bilirubin: 0.7 mg/dL (ref 0.3–1.2)
Total Protein: 6.6 g/dL (ref 6.5–8.1)

## 2020-06-22 LAB — CBC WITH DIFFERENTIAL (CANCER CENTER ONLY)
Abs Immature Granulocytes: 0.02 10*3/uL (ref 0.00–0.07)
Basophils Absolute: 0.1 10*3/uL (ref 0.0–0.1)
Basophils Relative: 1 %
Eosinophils Absolute: 0.1 10*3/uL (ref 0.0–0.5)
Eosinophils Relative: 1 %
HCT: 40.4 % (ref 39.0–52.0)
Hemoglobin: 13.5 g/dL (ref 13.0–17.0)
Immature Granulocytes: 0 %
Lymphocytes Relative: 12 %
Lymphs Abs: 0.6 10*3/uL — ABNORMAL LOW (ref 0.7–4.0)
MCH: 31.8 pg (ref 26.0–34.0)
MCHC: 33.4 g/dL (ref 30.0–36.0)
MCV: 95.1 fL (ref 80.0–100.0)
Monocytes Absolute: 0.5 10*3/uL (ref 0.1–1.0)
Monocytes Relative: 11 %
Neutro Abs: 3.6 10*3/uL (ref 1.7–7.7)
Neutrophils Relative %: 75 %
Platelet Count: 187 10*3/uL (ref 150–400)
RBC: 4.25 MIL/uL (ref 4.22–5.81)
RDW: 13.2 % (ref 11.5–15.5)
WBC Count: 4.9 10*3/uL (ref 4.0–10.5)
nRBC: 0 % (ref 0.0–0.2)

## 2020-06-22 LAB — FERRITIN: Ferritin: 58 ng/mL (ref 24–336)

## 2020-06-22 LAB — IRON AND TIBC
Iron: 85 ug/dL (ref 42–163)
Saturation Ratios: 25 % (ref 20–55)
TIBC: 336 ug/dL (ref 202–409)
UIBC: 251 ug/dL (ref 117–376)

## 2020-06-22 NOTE — Progress Notes (Signed)
Hematology and Oncology Follow Up Visit  Edwin Webster [id number] Apr 22, 1961 59 y.o. 06/22/2020   Principle Diagnosis:  Waldenstrom's macroglobulinemia Iron deficiency anemia  Current Therapy:   Rituxan/bendamustine-s/p cycle 2 -- d/c on 06/11/2018 Imbruvica  280 mg po q day -- changed on 08/06/2019 IV iron as indicated - last received in April 2019   Interim History:  Mr. Kuwahara is here today for follow-up.  So far, everything is going quite well.  He has had no problems since we last saw him back in April.  He has been busy.  He is done some fishing.  He likes to be outside.    He still gets fatigued quite easily.  I know that we have done iron studies on him.  The big news is that he had a Holter monitor put on him.  He has a bradycardia.  His heart rate goes down to 42.  It sounds like he might need a pacemaker.  When we last saw him, his M spike was 0.3 g/dL.  His IgM level was 465 mg/dL.    He has had no abdominal pain.  There is no nausea or vomiting.  He has had no change in bowel or bladder habits.  He has had no leg swelling.  He has had no rashes.  There has been no bleeding.  Currently, his performance status is ECOG 1.     Medications:  Allergies as of 06/22/2020      Reactions   Adalimumab Other (See Comments)   "blacked out", confused "blacked out"      Medication List       Accurate as of June 22, 2020  9:36 AM. If you have any questions, ask your nurse or doctor.        ALPRAZolam 1 MG tablet Commonly known as: Xanax Take 1/2 to 1 tablet 2 to 3 x /day ONLY if needed for Anxiety Attack(s)   aspirin EC 81 MG tablet Take 81 mg daily by mouth.   busPIRone 5 MG tablet Commonly known as: BUSPAR Take 1/2 to 1 tablet 3 x /day for Anxiety   escitalopram 20 MG tablet Commonly known as: LEXAPRO Take 1 tablet Daily for Mood   famciclovir 500 MG tablet Commonly known as: FAMVIR TAKE 1 TABLET BY MOUTH EVERY DAY   famotidine 40 MG tablet Commonly known  as: PEPCID Take 1 tabet Daily for Heartburn and Indigestion   fluticasone 50 MCG/ACT nasal spray Commonly known as: FLONASE Instill 2 sprays each Nares 2 x /day   folic acid 1 MG tablet Commonly known as: FOLVITE Take 1 tablet (1 mg total) by mouth daily.   gabapentin 300 MG capsule Commonly known as: NEURONTIN TAKE 1 CAPSULE (300 MG TOTAL) BY MOUTH 3 (THREE) TIMES DAILY AS NEEDED. FOR SHINGLES PAIN   ibuprofen 800 MG tablet Commonly known as: Motrin IB Take 1 tablet (800 mg total) by mouth daily as needed.   Imbruvica 280 MG Tabs Generic drug: Ibrutinib TAKE 1 TABLET (280 MG) BY MOUTH DAILY.   levocetirizine 5 MG tablet Commonly known as: XYZAL Take 1 tablet Daily for Allergies   losartan 100 MG tablet Commonly known as: COZAAR Take 100 mg by mouth at bedtime.   olmesartan 40 MG tablet Commonly known as: BENICAR Take 1 tablet at Bedtime  for BP   rosuvastatin 40 MG tablet Commonly known as: CRESTOR Take 1 tablet Daily for Cholesterol   triamcinolone ointment 0.1 % Commonly known as: KENALOG Apply 1 application  topically 2 (two) times daily.   vitamin B-12 500 MCG tablet Commonly known as: CYANOCOBALAMIN Take 500 mcg by mouth daily.   Vitamin D-3 125 MCG (5000 UT) Tabs Take 10,000 Units by mouth daily.       Allergies:  Allergies  Allergen Reactions  . Adalimumab Other (See Comments)    "blacked out", confused "blacked out"    Past Medical History, Surgical history, Social history, and Family History were reviewed and updated.  Review of Systems: Review of Systems  Constitutional: Negative.   HENT: Negative.   Eyes: Negative.   Respiratory: Negative.   Cardiovascular: Negative.   Gastrointestinal: Negative.   Genitourinary: Negative.   Musculoskeletal: Negative.   Skin: Negative.   Neurological: Negative.   Endo/Heme/Allergies: Negative.   Psychiatric/Behavioral: Negative.       Physical Exam:  weight is 179 lb 12 oz (81.5 kg). His oral  temperature is 98.2 F (36.8 C). His blood pressure is 124/72 and his pulse is 50. His respiration is 18 and oxygen saturation is 100%.   Wt Readings from Last 3 Encounters:  06/22/20 179 lb 12 oz (81.5 kg)  06/01/20 176 lb 3.2 oz (79.9 kg)  05/16/20 177 lb (80.3 kg)  Well then  Physical Exam Vitals reviewed.  HENT:     Head: Normocephalic and atraumatic.  Eyes:     Pupils: Pupils are equal, round, and reactive to light.  Cardiovascular:     Rate and Rhythm: Normal rate and regular rhythm.     Heart sounds: Normal heart sounds.  Pulmonary:     Effort: Pulmonary effort is normal.     Breath sounds: Normal breath sounds.  Abdominal:     General: Bowel sounds are normal.     Palpations: Abdomen is soft.  Musculoskeletal:        General: No tenderness or deformity. Normal range of motion.     Cervical back: Normal range of motion.  Lymphadenopathy:     Cervical: No cervical adenopathy.  Skin:    General: Skin is warm and dry.     Findings: No erythema or rash.  Neurological:     Mental Status: He is alert and oriented to person, place, and time.  Psychiatric:        Behavior: Behavior normal.        Thought Content: Thought content normal.        Judgment: Judgment normal.      Lab Results  Component Value Date   WBC 4.9 06/22/2020   HGB 13.5 06/22/2020   HCT 40.4 06/22/2020   MCV 95.1 06/22/2020   PLT 187 06/22/2020   Lab Results  Component Value Date   FERRITIN 68 03/07/2020   IRON 69 03/07/2020   TIBC 315 03/07/2020   UIBC 246 03/07/2020   IRONPCTSAT 22 03/07/2020   Lab Results  Component Value Date   RETICCTPCT 0.9 02/12/2018   RBC 4.25 06/22/2020   RETICCTABS 33,120 02/12/2018   Lab Results  Component Value Date   KPAFRELGTCHN 28.5 (H) 03/07/2020   LAMBDASER 11.4 03/07/2020   KAPLAMBRATIO 2.50 (H) 03/07/2020   Lab Results  Component Value Date   IGGSERUM 404 (L) 03/07/2020   IGGSERUM 408 (L) 03/07/2020   IGA 31 (L) 03/07/2020   IGA 33 (L)  03/07/2020   IGMSERUM 455 (H) 03/07/2020   IGMSERUM 472 (H) 03/07/2020   Lab Results  Component Value Date   TOTALPROTELP 6.6 03/07/2020   ALBUMINELP 3.9 03/07/2020   A1GS 0.3 03/07/2020  A2GS 0.7 03/07/2020   BETS 1.0 03/07/2020   BETA2SER 0.2 02/18/2018   GAMS 0.7 03/07/2020   MSPIKE 0.3 (H) 03/07/2020   SPEI Comment 07/09/2018     Chemistry      Component Value Date/Time   NA 141 06/22/2020 0840   K 4.1 06/22/2020 0840   CL 105 06/22/2020 0840   CO2 29 06/22/2020 0840   BUN 13 06/22/2020 0840   CREATININE 1.25 (H) 06/22/2020 0840   CREATININE 1.14 02/22/2020 1132      Component Value Date/Time   CALCIUM 9.9 06/22/2020 0840   ALKPHOS 85 06/22/2020 0840   AST 31 06/22/2020 0840   ALT 37 06/22/2020 0840   BILITOT 0.7 06/22/2020 0840      Impression and Plan: Mr. Lizarraga is a very pleasant 59 yo caucasian gentleman with Waldenstrom's macroglobulinemia.   Hopefully, we will not have to adjust the Imbruvica dose.  He is tolerating the 280 mg dose quite nicely.  We will plan to get him back in 3 more months.    [name], MD 7/28/20219:36 AM

## 2020-06-22 NOTE — Telephone Encounter (Signed)
Appointments scheduled calendar printed per 7/28 los

## 2020-06-23 LAB — KAPPA/LAMBDA LIGHT CHAINS
Kappa free light chain: 28.7 mg/L — ABNORMAL HIGH (ref 3.3–19.4)
Kappa, lambda light chain ratio: 2.22 — ABNORMAL HIGH (ref 0.26–1.65)
Lambda free light chains: 12.9 mg/L (ref 5.7–26.3)

## 2020-06-23 LAB — IGG, IGA, IGM
IgA: 38 mg/dL — ABNORMAL LOW (ref 61–437)
IgG (Immunoglobin G), Serum: 405 mg/dL — ABNORMAL LOW (ref 603–1613)
IgM (Immunoglobulin M), Srm: 436 mg/dL — ABNORMAL HIGH (ref 20–172)

## 2020-06-24 LAB — PROTEIN ELECTROPHORESIS, SERUM, WITH REFLEX
A/G Ratio: 1.3 (ref 0.7–1.7)
Albumin ELP: 3.5 g/dL (ref 2.9–4.4)
Alpha-1-Globulin: 0.2 g/dL (ref 0.0–0.4)
Alpha-2-Globulin: 0.8 g/dL (ref 0.4–1.0)
Beta Globulin: 0.9 g/dL (ref 0.7–1.3)
Gamma Globulin: 0.7 g/dL (ref 0.4–1.8)
Globulin, Total: 2.6 g/dL (ref 2.2–3.9)
M-Spike, %: 0.4 g/dL — ABNORMAL HIGH
SPEP Interpretation: 0
Total Protein ELP: 6.1 g/dL (ref 6.0–8.5)

## 2020-06-24 LAB — IMMUNOFIXATION REFLEX, SERUM
IgA: 39 mg/dL — ABNORMAL LOW (ref 61–437)
IgG (Immunoglobin G), Serum: 461 mg/dL — ABNORMAL LOW (ref 603–1613)
IgM (Immunoglobulin M), Srm: 445 mg/dL — ABNORMAL HIGH (ref 20–172)

## 2020-06-27 ENCOUNTER — Telehealth: Payer: Self-pay | Admitting: *Deleted

## 2020-06-27 NOTE — Telephone Encounter (Signed)
-----   Message from [name], MD sent at 06/24/2020  4:47 PM EDT ----- Call - the IgM level is still holding steady!!  Laurey Arrow

## 2020-06-27 NOTE — Telephone Encounter (Signed)
Called pt, unable to reach. lmovm with lab results.

## 2020-06-29 ENCOUNTER — Other Ambulatory Visit: Payer: Self-pay | Admitting: Physician Assistant

## 2020-06-29 DIAGNOSIS — R001 Bradycardia, unspecified: Secondary | ICD-10-CM

## 2020-06-29 DIAGNOSIS — R5383 Other fatigue: Secondary | ICD-10-CM

## 2020-07-04 MED FILL — IMBRUVICA 280 MG TAB: 280 | 28 days supply | Qty: 28 | Fill #3

## 2020-07-05 ENCOUNTER — Telehealth: Payer: Self-pay | Admitting: Cardiovascular Disease

## 2020-07-05 NOTE — Telephone Encounter (Signed)
Spoke with Margreta Journey about ZIO  Patient had symptomatic bradycardia @ 40bpm for 30 seconds on 06/07/2020 @ 9:22am  The report is finalized/posted - is not in Epic yet. Will notify Markus Daft to upload when able and send to MD  Patient has OV on 07/15/2020

## 2020-07-05 NOTE — Telephone Encounter (Signed)
Christina with [employer] is calling to report critical Zio monitor results.

## 2020-07-07 NOTE — Telephone Encounter (Signed)
Has SB in high 30s and 40s. Ive never seen this pt before. Is he going to be a new pt for me?

## 2020-07-08 NOTE — Telephone Encounter (Signed)
Spoke with the patient about his ZIO holter, I am still unable to see the report but Dr. Gwenlyn Found read it and states he is having bradycardia as low as 30's-40's and he is symptomatic. He had normal stress test 2011, no chest pain but he is now having some SOB with exertion and fatigue. Recovers quickly.  At this time the patient does not want to go to the ER. We discussed very strict ER precautions and we will see if we can get him in soon since he is symptomatic.

## 2020-07-08 NOTE — Telephone Encounter (Signed)
Patient has new patient visit with Dr. Gwenlyn Found on 07/15/20  Message forwarded to ordering provider

## 2020-07-15 ENCOUNTER — Ambulatory Visit: Payer: 59 | Admitting: Cardiovascular Disease

## 2020-07-15 ENCOUNTER — Other Ambulatory Visit: Payer: Self-pay

## 2020-07-15 ENCOUNTER — Encounter: Payer: Self-pay | Admitting: Cardiovascular Disease

## 2020-07-15 DIAGNOSIS — I10 Essential (primary) hypertension: Secondary | ICD-10-CM

## 2020-07-15 DIAGNOSIS — R001 Bradycardia, unspecified: Secondary | ICD-10-CM

## 2020-07-15 DIAGNOSIS — R0789 Other chest pain: Secondary | ICD-10-CM | POA: Insufficient documentation

## 2020-07-15 DIAGNOSIS — E782 Mixed hyperlipidemia: Secondary | ICD-10-CM | POA: Diagnosis not present

## 2020-07-15 LAB — BASIC METABOLIC PANEL
BUN/Creatinine Ratio: 11 (ref 10–24)
BUN: 15 mg/dL (ref 8–27)
CO2: 26 mmol/L (ref 20–29)
Calcium: 9.9 mg/dL (ref 8.6–10.2)
Chloride: 102 mmol/L (ref 96–106)
Creatinine, Ser: 1.37 mg/dL — ABNORMAL HIGH (ref 0.76–1.27)
GFR calc Af Amer: 64 mL/min/{1.73_m2} (ref >59)
GFR calc non Af Amer: 55 mL/min/{1.73_m2} — ABNORMAL LOW (ref >59)
Glucose: 80 mg/dL (ref 65–99)
Potassium: 5.1 mmol/L (ref 3.5–5.2)
Sodium: 139 mmol/L (ref 134–144)

## 2020-07-15 NOTE — Assessment & Plan Note (Signed)
[name] complains of atypical chest pain.  Does have positive risk factors.  I am going to get a coronary CTA to further evaluate.

## 2020-07-15 NOTE — Progress Notes (Signed)
07/15/2020 Edwin Webster   1961/09/20  [id number]  Primary Physician Unk Pinto, MD Primary Cardiologist: Lorretta Harp MD [name], [name]:  Edwin Webster is a 59 y.o. thin appearing married Caucasian male father of 3 children, grandfather of 4 grandsons referred by Dr. Melford Aase , his PCP for symptomatic bradycardia.  He is retired from doing Dealer work.  His risk factors include 30 to 40 pack years of tobacco abuse having quit 15 to 20 years ago as well as treated hypertension hyperlipidemia.  There is no family history for heart disease.  Never had a heart attack stroke.  He does get atypical chest pain lasting 15 seconds at a time.  Major complaints are of progressive disabling fatigue and dyspnea on exertion over the last 3 to 4 months with occasional dizziness.  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  A 2-week Zio patch did show sinus bradycardia in the high 30s and 40s during the daytime hours.   Current Meds  Medication Sig  . ALPRAZolam (XANAX) 1 MG tablet Take 1/2 to 1 tablet 2 to 3 x /day ONLY if needed for Anxiety Attack(s)  . aspirin EC 81 MG tablet Take 81 mg daily by mouth.  . busPIRone (BUSPAR) 5 MG tablet Take 1/2 to 1 tablet 3 x /day for Anxiety  . Cholecalciferol (VITAMIN D-3) 5000 UNITS TABS Take 10,000 Units by mouth daily.   Marland Kitchen escitalopram (LEXAPRO) 20 MG tablet Take 1 tablet Daily for Mood  . famciclovir (FAMVIR) 500 MG tablet TAKE 1 TABLET BY MOUTH EVERY DAY  . famotidine (PEPCID) 40 MG tablet Take 1 tabet Daily for Heartburn and Indigestion  . fluticasone (FLONASE) 50 MCG/ACT nasal spray Instill 2 sprays each Nares 2 x /day  . folic acid (FOLVITE) 1 MG tablet Take 1 tablet (1 mg total) by mouth daily.  Marland Kitchen gabapentin (NEURONTIN) 300 MG capsule TAKE 1 CAPSULE (300 MG TOTAL) BY MOUTH 3 (THREE) TIMES DAILY AS NEEDED. FOR SHINGLES PAIN  . ibuprofen (ADVIL,MOTRIN) 800 MG tablet Take 1 tablet (800 mg total) by mouth daily as needed.  .  IMBRUVICA 280 MG TABS TAKE 1 TABLET (280 MG) BY MOUTH DAILY.  Marland Kitchen levocetirizine (XYZAL) 5 MG tablet Take 1 tablet Daily for Allergies  . olmesartan (BENICAR) 40 MG tablet Take 1 tablet at Bedtime  for BP  . rosuvastatin (CRESTOR) 40 MG tablet Take 1 tablet Daily for Cholesterol  . triamcinolone ointment (KENALOG) 0.1 % Apply 1 application topically 2 (two) times daily.  . vitamin B-12 (CYANOCOBALAMIN) 500 MCG tablet Take 500 mcg by mouth daily.     Allergies  Allergen Reactions  . Adalimumab Other (See Comments)    "blacked out", confused "blacked out"    Social History   Socioeconomic History  . Marital status: Married    Spouse name: Not on file  . Number of children: Not on file  . Years of education: Not on file  . Highest education level: Not on file  Occupational History  . Not on file  Tobacco Use  . Smoking status: Former Smoker    Packs/day: 2.00    Types: Cigarettes    Quit date: 07/03/2010    Years since quitting: 10.0  . Smokeless tobacco: Never Used  Vaping Use  . Vaping Use: Never used  Substance and Sexual Activity  . Alcohol use: No    Alcohol/week: 0.0 standard drinks  . Drug use: No  . Sexual activity: Not on  file  Other Topics Concern  . Not on file  Social History Narrative  . Not on file   Social Determinants of Health   Financial Resource Strain:   . Difficulty of Paying Living Expenses: Not on file  Food Insecurity:   . Worried About Charity fundraiser in the Last Year: Not on file  . Ran Out of Food in the Last Year: Not on file  Transportation Needs:   . Lack of Transportation (Medical): Not on file  . Lack of Transportation (Non-Medical): Not on file  Physical Activity:   . Days of Exercise per Week: Not on file  . Minutes of Exercise per Session: Not on file  Stress:   . Feeling of Stress : Not on file  Social Connections:   . Frequency of Communication with Friends and Family: Not on file  . Frequency of Social Gatherings with  Friends and Family: Not on file  . Attends Religious Services: Not on file  . Active Member of Clubs or Organizations: Not on file  . Attends Archivist Meetings: Not on file  . Marital Status: Not on file  Intimate Partner Violence:   . Fear of Current or Ex-Partner: Not on file  . Emotionally Abused: Not on file  . Physically Abused: Not on file  . Sexually Abused: Not on file     Review of Systems: General: negative for chills, fever, night sweats or weight changes.  Cardiovascular: negative for chest pain, dyspnea on exertion, edema, orthopnea, palpitations, paroxysmal nocturnal dyspnea or shortness of breath Dermatological: negative for rash Respiratory: negative for cough or wheezing Urologic: negative for hematuria Abdominal: negative for nausea, vomiting, diarrhea, bright red blood per rectum, melena, or hematemesis Neurologic: negative for visual changes, syncope, or dizziness All other systems reviewed and are otherwise negative except as noted above.    Blood pressure 134/86, pulse (!) 44, height 5\' 11"  (1.803 m), weight 178 lb 9.6 oz (81 kg), SpO2 96 %.  General appearance: alert and no distress Neck: no adenopathy, no carotid bruit, no JVD, supple, symmetrical, trachea midline and thyroid not enlarged, symmetric, no tenderness/mass/nodules Lungs: clear to auscultation bilaterally Heart: regular rate and rhythm, S1, S2 normal, no murmur, click, rub or gallop Extremities: extremities normal, atraumatic, no cyanosis or edema Pulses: 2+ and symmetric Skin: Skin color, texture, turgor normal. No rashes or lesions Neurologic: Alert and oriented X 3, normal strength and tone. Normal symmetric reflexes. Normal coordination and gait  EKG Marked sinus bradycardia 44 without ST or T wave changes.  I personally reviewed this EKG.  ASSESSMENT AND PLAN:   Hyperlipidemia History of hyperlipidemia on high-dose Crestor with lipid profile performed 02/22/2020 revealing a  total cholesterol 54, LDL 91 HDL 49.  Hypertension She does not show hypertension with blood pressure measured today 134/86.  He is on Benicar.  Sinus bradycardia [name] was referred to me for symptomatic bradycardia.  He did have a 2-week Zio patch showed heart rates in the high 30s and 40s during the daytime hours.  He does complain of excessive fatigue and dyspnea on exertion.  He is on no negative chronotropic drugs that I can see.  I would refer him to EP for further evaluation.  He may be a candidate for permanent transvenous pacemaker insertion for symptomatic bradycardia.  He does also complain of occasional dizziness.  Atypical chest pain Mr. Pressly complains of atypical chest pain.  Does have positive risk factors.  I am going to get  a coronary CTA to further evaluate.      Lorretta Harp MD FACP,FACC,FAHA, [facility] 07/15/2020 8:37 AM

## 2020-07-15 NOTE — Patient Instructions (Addendum)
Medication Instructions:  The current medical regimen is effective;  continue present plan and medications.  *If you need a refill on your cardiac medications before your next appointment, please call your pharmacy*   Lab Work: BMET for CT  If you have labs (blood work) drawn today and your tests are completely normal, you will receive your results only by: Marland Kitchen MyChart Message (if you have MyChart) OR . A paper copy in the mail If you have any lab test that is abnormal or we need to change your treatment, we will call you to review the results.   Testing/Procedures: Echocardiogram - Your physician has requested that you have an echocardiogram. Echocardiography is a painless test that uses sound waves to create images of your heart. It provides your doctor with information about the size and shape of your heart and how well your heart's chambers and valves are working. This procedure takes approximately one hour. There are no restrictions for this procedure. This will be performed at our [facility] location - [address].  Your physician has requested that you have cardiac CT. Cardiac computed tomography (CT) is a painless test that uses an x-ray machine to take clear, detailed pictures of your heart. For further information please visit [email or web address].[email or web address]. Please follow instruction sheet as given.    Follow-Up: At [facility], you and your health needs are our priority.  As part of our continuing mission to provide you with exceptional heart care, we have created designated Provider Care Teams.  These Care Teams include your primary Cardiologist (physician) and Advanced Practice Providers (APPs -  Physician Assistants and Nurse Practitioners) who all work together to provide you with the care you need, when you need it.  We recommend signing up for the patient portal called "MyChart".  Sign up information is provided on this After Visit Summary.  MyChart is used to connect  with patients for Virtual Visits (Telemedicine).  Patients are able to view lab/test results, encounter notes, upcoming appointments, etc.  Non-urgent messages can be sent to your provider as well.   To learn more about what you can do with MyChart, go to [email or web address].    Your next appointment:   3 month(s)  The format for your next appointment:   In Person  Provider:   Quay Burow, MD   Other Instructions Your cardiac CT will be scheduled at one of the below locations:   [facility] [address] [place], [place] [address] [phone]  [place] [address] [place], [place] [address] [phone]  If scheduled at [facility], please arrive at the [facility] main entrance of [facility] 30 minutes prior to test start time. Proceed to the [facility] Radiology Department (first floor) to check-in and test prep.  If scheduled at [facility], please arrive 15 mins early for check-in and test prep.  Please follow these instructions carefully (unless otherwise directed):  Hold all erectile dysfunction medications at least 3 days (72 hrs) prior to test.  On the Night Before the Test: . Be sure to Drink plenty of water. . Do not consume any caffeinated/decaffeinated beverages or chocolate 12 hours prior to your test. . Do not take any antihistamines 12 hours prior to your test.  On the Day of the Test: . Drink plenty of water. Do not drink any water within one hour of the test. . Do not eat  any food 4 hours prior to the test. . You may take your regular medications prior to the test.  . Take metoprolol (Lopressor) two hours prior to test. . HOLD Furosemide/Hydrochlorothiazide morning of the test. . FEMALES- please wear underwire-free bra if available       After the Test: . Drink plenty of water. . After receiving IV contrast, you may experience a  mild flushed feeling. This is normal. . On occasion, you may experience a mild rash up to 24 hours after the test. This is not dangerous. If this occurs, you can take Benadryl 25 mg and increase your fluid intake. . If you experience trouble breathing, this can be serious. If it is severe call 911 IMMEDIATELY. If it is mild, please call our office. . If you take any of these medications: Glipizide/Metformin, Avandament, Glucavance, please do not take 48 hours after completing test unless otherwise instructed.   Once we have confirmed authorization from your insurance company, we will call you to set up a date and time for your test. Based on how quickly your insurance processes prior authorizations requests, please allow up to 4 weeks to be contacted for scheduling your Cardiac CT appointment. Be advised that routine Cardiac CT appointments could be scheduled as many as 8 weeks after your provider has ordered it.  For non-scheduling related questions, please contact the cardiac imaging nurse navigator should you have any questions/concerns: Marchia Bond, Cardiac Imaging Nurse Navigator Burley Saver, Interim Cardiac Imaging Nurse [facility] Direct Office Dial: [phone]   For scheduling needs, including cancellations and rescheduling, please call Vivien Rota at [phone], option 3.

## 2020-07-15 NOTE — Assessment & Plan Note (Signed)
She does not show hypertension with blood pressure measured today 134/86.  He is on Benicar.

## 2020-07-15 NOTE — Assessment & Plan Note (Signed)
History of hyperlipidemia on high-dose Crestor with lipid profile performed 02/22/2020 revealing a total cholesterol 54, LDL 91 HDL 49.

## 2020-07-15 NOTE — Assessment & Plan Note (Signed)
Edwin Webster was referred to me for symptomatic bradycardia.  He did have a 2-week Zio patch showed heart rates in the high 30s and 40s during the daytime hours.  He does complain of excessive fatigue and dyspnea on exertion.  He is on no negative chronotropic drugs that I can see.  I would refer him to EP for further evaluation.  He may be a candidate for permanent transvenous pacemaker insertion for symptomatic bradycardia.  He does also complain of occasional dizziness.

## 2020-07-20 ENCOUNTER — Telehealth: Payer: Self-pay | Admitting: Cardiovascular Disease

## 2020-07-20 NOTE — Telephone Encounter (Signed)
LVM for pt stating we normally do not seek prior authorization for echo's. If he believes he still needs one, call back the office and we can request one for him.

## 2020-07-20 NOTE — Telephone Encounter (Signed)
Patient states that his insurance has not received anything to approve his echo that is scheduled for 07/27/20. Please advise.

## 2020-07-27 ENCOUNTER — Ambulatory Visit (HOSPITAL_COMMUNITY): Payer: 59 | Attending: Cardiovascular Disease

## 2020-07-27 ENCOUNTER — Other Ambulatory Visit: Payer: Self-pay

## 2020-07-27 DIAGNOSIS — R0789 Other chest pain: Secondary | ICD-10-CM | POA: Diagnosis present

## 2020-07-27 DIAGNOSIS — R001 Bradycardia, unspecified: Secondary | ICD-10-CM | POA: Insufficient documentation

## 2020-07-27 LAB — ECHOCARDIOGRAM COMPLETE
Area-P 1/2: 2.49 cm2
S' Lateral: 2.8 cm

## 2020-07-29 ENCOUNTER — Other Ambulatory Visit: Payer: Self-pay | Admitting: Hematology & Oncology

## 2020-07-29 DIAGNOSIS — C880 Waldenstrom macroglobulinemia: Secondary | ICD-10-CM

## 2020-08-02 ENCOUNTER — Telehealth (HOSPITAL_COMMUNITY): Payer: Self-pay | Admitting: *Deleted

## 2020-08-02 MED FILL — IMBRUVICA 280 MG TAB: 280 | 28 days supply | Qty: 28 | Fill #0

## 2020-08-02 NOTE — Telephone Encounter (Signed)

## 2020-08-03 ENCOUNTER — Ambulatory Visit (HOSPITAL_COMMUNITY)
Admission: RE | Admit: 2020-08-03 | Discharge: 2020-08-03 | Disposition: A | Discharge status: Home / Self Care | Payer: 59 | Source: Ambulatory Visit | Attending: Cardiovascular Disease | Admitting: Cardiovascular Disease

## 2020-08-03 ENCOUNTER — Other Ambulatory Visit: Payer: Self-pay

## 2020-08-03 DIAGNOSIS — R0789 Other chest pain: Secondary | ICD-10-CM | POA: Diagnosis present

## 2020-08-03 DIAGNOSIS — I2510 Atherosclerotic heart disease of native coronary artery without angina pectoris: Secondary | ICD-10-CM

## 2020-08-03 MED ORDER — NITROGLYCERIN 0.4 MG SL SUBL
0.8000 mg | SUBLINGUAL_TABLET | Freq: Once | SUBLINGUAL | Status: AC
  Administered 2020-08-03: 0.8 mg via SUBLINGUAL

## 2020-08-03 MED ORDER — IOHEXOL 350 MG/ML SOLN
80.0000 mL | Freq: Once | INTRAVENOUS | Status: AC | PRN
  Administered 2020-08-03: 80 mL via INTRAVENOUS

## 2020-08-03 MED ORDER — NITROGLYCERIN 0.4 MG SL SUBL
SUBLINGUAL_TABLET | SUBLINGUAL | Status: AC
  Filled 2020-08-03: qty 2

## 2020-08-03 NOTE — Progress Notes (Signed)
CT scan completed. Tolerated well. D/C home ambulatory with wife, awake and alert. In no distress 

## 2020-08-04 ENCOUNTER — Encounter: Payer: Self-pay | Admitting: Cardiology

## 2020-08-04 ENCOUNTER — Ambulatory Visit: Payer: 59 | Admitting: Cardiology

## 2020-08-04 VITALS — BP 120/72 | HR 55 | Ht 71.0 in | Wt 179.0 lb

## 2020-08-04 DIAGNOSIS — Z01812 Encounter for preprocedural laboratory examination: Secondary | ICD-10-CM

## 2020-08-04 DIAGNOSIS — I495 Sick sinus syndrome: Secondary | ICD-10-CM

## 2020-08-04 LAB — CBC
Hematocrit: 38.9 % (ref 37.5–51.0)
Hemoglobin: 13.1 g/dL (ref 13.0–17.7)
MCH: 31.3 pg (ref 26.6–33.0)
MCHC: 33.7 g/dL (ref 31.5–35.7)
MCV: 93 fL (ref 79–97)
Platelets: 182 10*3/uL (ref 150–450)
RBC: 4.19 x10E6/uL (ref 4.14–5.80)
RDW: 14 % (ref 11.6–15.4)
WBC: 5 10*3/uL (ref 3.4–10.8)

## 2020-08-04 LAB — BASIC METABOLIC PANEL
BUN/Creatinine Ratio: 10 (ref 10–24)
BUN: 13 mg/dL (ref 8–27)
CO2: 24 mmol/L (ref 20–29)
Calcium: 9.7 mg/dL (ref 8.6–10.2)
Chloride: 103 mmol/L (ref 96–106)
Creatinine, Ser: 1.32 mg/dL — ABNORMAL HIGH (ref 0.76–1.27)
GFR calc Af Amer: 67 mL/min/{1.73_m2} (ref >59)
GFR calc non Af Amer: 58 mL/min/{1.73_m2} — ABNORMAL LOW (ref >59)
Glucose: 80 mg/dL (ref 65–99)
Potassium: 4.1 mmol/L (ref 3.5–5.2)
Sodium: 141 mmol/L (ref 134–144)

## 2020-08-04 NOTE — Progress Notes (Signed)
Electrophysiology Office Note   Date:  08/04/2020   ID:  Edwin Webster, DOB 18-Feb-1961, MRN [id number]  PCP:  Unk Pinto, MD  Cardiologist:  Gwenlyn Found Primary Electrophysiologist:  Porter Moes Meredith Leeds, MD    Chief Complaint: palpitations   History of Present Illness: Edwin Webster is a 59 y.o. male who is being seen today for the evaluation of SVT at the request of Lorretta Harp, MD. Presenting today for electrophysiology evaluation.  He has a history of COPD, hyperlipidemia, and Walden Strom's macroglobulin globule anemia.  He wore a 2-week Zio patch that showed episodes of sinus bradycardia with heart rates into the 30s to 40s.  This occurred during daytime hours.  He had complaints of fatigue and dyspnea on exertion over the last 3 to 4 months with occasional dizziness.  Today, he denies symptoms of palpitations, chest pain, shortness of breath, orthopnea, PND, lower extremity edema, claudication, presyncope, syncope, bleeding, or neurologic sequela. The patient is tolerating medications without difficulties.    Past Medical History:  Diagnosis Date  . Allergy   . Anemia   . Anxiety   . Arthritis   . Chest pain, non-cardiac   . COPD (chronic obstructive pulmonary disease) ([place])   . Counseling regarding goals of care 03/27/2018  . Depression   . Diverticulitis   . Diverticulosis   . Fractured pelvis ([place])    fall from ladder  . GERD (gastroesophageal reflux disease)   . Gout   . History of shingles   . Hyperlipidemia   . Internal hemorrhoids   . Iron deficiency anemia 03/13/2018  . Kidney stones   . Other abnormal glucose 10/11/2015  . Rheumatoid arthritis ([place])    hands  . Waldenstrom's macroglobulinemia ([place]) 03/27/2018   Past Surgical History:  Procedure Laterality Date  . COLONOSCOPY    . FINGER SURGERY    . iron infusion    . KNEE SURGERY     right  . ROTATOR CUFF REPAIR  1996   left     Current Outpatient Medications  Medication Sig Dispense Refill   . ALPRAZolam (XANAX) 1 MG tablet Take 1/2 to 1 tablet 2 to 3 x /day ONLY if needed for Anxiety Attack(s) 90 tablet 1  . aspirin EC 81 MG tablet Take 81 mg daily by mouth.    . busPIRone (BUSPAR) 5 MG tablet Take 1/2 to 1 tablet 3 x /day for Anxiety 270 tablet 1  . Cholecalciferol (VITAMIN D-3) 5000 UNITS TABS Take 10,000 Units by mouth daily.     Marland Kitchen escitalopram (LEXAPRO) 20 MG tablet Take 1 tablet Daily for Mood 90 tablet 0  . famciclovir (FAMVIR) 500 MG tablet TAKE 1 TABLET BY MOUTH EVERY DAY 90 tablet 3  . famotidine (PEPCID) 40 MG tablet Take 1 tabet Daily for Heartburn and Indigestion 90 tablet 3  . fluticasone (FLONASE) 50 MCG/ACT nasal spray Instill 2 sprays each Nares 2 x /day 48 g 3  . folic acid (FOLVITE) 1 MG tablet Take 1 tablet (1 mg total) by mouth daily. 90 tablet 2  . gabapentin (NEURONTIN) 300 MG capsule TAKE 1 CAPSULE (300 MG TOTAL) BY MOUTH 3 (THREE) TIMES DAILY AS NEEDED. FOR SHINGLES PAIN 270 capsule 1  . ibuprofen (ADVIL,MOTRIN) 800 MG tablet Take 1 tablet (800 mg total) by mouth daily as needed. 90 tablet 1  . IMBRUVICA 280 MG TABS TAKE 1 TABLET (280 MG) BY MOUTH DAILY. 28 tablet 3  . levocetirizine (XYZAL) 5 MG tablet Take  1 tablet Daily for Allergies 90 tablet 3  . olmesartan (BENICAR) 40 MG tablet Take 1 tablet at Bedtime  for BP 90 tablet 3  . rosuvastatin (CRESTOR) 40 MG tablet Take 1 tablet Daily for Cholesterol 90 tablet 3  . triamcinolone ointment (KENALOG) 0.1 % Apply 1 application topically 2 (two) times daily. 80 g 1  . vitamin B-12 (CYANOCOBALAMIN) 500 MCG tablet Take 500 mcg by mouth daily.     No current facility-administered medications for this visit.    Allergies:   Adalimumab   Social History:  The patient  reports that he quit smoking about 10 years ago. His smoking use included cigarettes. He smoked 2.00 packs per day. He has never used smokeless tobacco. He reports that he does not drink alcohol and does not use drugs.   Family History:  The  patient's family history includes COPD in his maternal grandfather and mother; Cancer in his mother; Colon cancer (age of onset: 60) in his maternal grandfather; Heart failure in his mother; Hyperlipidemia in his brother and mother; Hypertension in his father; Stroke in his maternal grandfather.    ROS:  Please see the history of present illness.   Otherwise, review of systems is positive for none.   All other systems are reviewed and negative.    PHYSICAL EXAM: VS:  BP 120/72   Pulse (!) 55   Ht 5\' 11"  (1.803 m)   Wt 179 lb (81.2 kg)   SpO2 95%   BMI 24.97 kg/m  , BMI Body mass index is 24.97 kg/m. GEN: Well nourished, well developed, in no acute distress  HEENT: normal  Neck: no JVD, carotid bruits, or masses Cardiac: RRR; no murmurs, rubs, or gallops,no edema  Respiratory:  clear to auscultation bilaterally, normal work of breathing GI: soft, nontender, nondistended, + BS MS: no deformity or atrophy  Skin: warm and dry Neuro:  Strength and sensation are intact Psych: euthymic mood, full affect  EKG:  EKG is not ordered today. Personal review of the ekg ordered 07/15/20 shows sinus rhythm, rate 44  Recent Labs: 02/22/2020: Magnesium 2.1; TSH 2.45 06/22/2020: ALT 37; Hemoglobin 13.5; Platelet Count 187 07/15/2020: BUN 15; Creatinine, Ser 1.37; Potassium 5.1; Sodium 139    Lipid Panel     Component Value Date/Time   CHOL 154 02/22/2020 1132   TRIG 56 02/22/2020 1132   HDL 49 02/22/2020 1132   CHOLHDL 3.1 02/22/2020 1132   VLDL 12 04/16/2017 1712   LDLCALC 91 02/22/2020 1132     Wt Readings from Last 3 Encounters:  08/04/20 179 lb (81.2 kg)  07/15/20 178 lb 9.6 oz (81 kg)  06/22/20 179 lb 12 oz (81.5 kg)      Other studies Reviewed: Additional studies/ records that were reviewed today include: TTE 07/27/20  Review of the above records today demonstrates:  1. Left ventricular ejection fraction, by estimation, is 55 to 60%. The  left ventricle has normal function.  The left ventricle has no regional  wall motion abnormalities. Left ventricular diastolic parameters were  normal.  2. Right ventricular systolic function is normal. The right ventricular  size is normal.  3. The mitral valve is normal in structure. No evidence of mitral valve  regurgitation. No evidence of mitral stenosis.  4. The aortic valve is normal in structure. Aortic valve regurgitation is  mild. Mild aortic valve sclerosis is present, with no evidence of aortic  valve stenosis.  5. The inferior vena cava is normal in size with  greater than 50%  respiratory variability, suggesting right atrial pressure of 3 mmHg.   Monitor 07/10/20 personally reviewed 1. SR/SB/ST 2. Occasional PVCs/PACs 3. Short runs of SVT  ASSESSMENT AND PLAN:  1.  Sick sinus syndrome: Wore a 2-week monitor that showed heart rates in the 30s and 40s.  He does have complaints of fatigue and dyspnea on exertion.  Currently on no chronotropic medications.  He would likely benefit from pacemaker implant.  Risks and benefits were discussed include bleeding, tamponade, infection, pneumothorax, lead dislodgment, among others.  The patient understands these risks and has agreed to the procedure.  2.  Hypertension: Currently well controlled  3.  Hyperlipidemia: Continue Crestor per primary cardiology and primary care.  Case discussed with primary cardiology  Current medicines are reviewed at length with the patient today.   The patient does not have concerns regarding his medicines.  The following changes were made today:  none  Labs/ tests ordered today include:  Orders Placed This Encounter  Procedures  . Basic metabolic panel  . CBC     Disposition:   FU with Fermina Mishkin 3 months  Signed, Shuntavia Yerby Meredith Leeds, MD  08/04/2020 12:06 PM     [place] [address] [place] [address] [phone] (office) [phone] (fax)

## 2020-08-04 NOTE — Patient Instructions (Addendum)
Medication Instructions:  Your physician recommends that you continue on your current medications as directed. Please refer to the Current Medication list given to you today.  *If you need a refill on your cardiac medications before your next appointment, please call your pharmacy*   Lab Work: Pre procedure labs today: BMET, CBC If you have labs (blood work) drawn today and your tests are completely normal, you will receive your results only by: Marland Kitchen MyChart Message (if you have MyChart) OR . A paper copy in the mail If you have any lab test that is abnormal or we need to change your treatment, we will call you to review the results.   Testing/Procedures: Your physician has recommended that you have a pacemaker inserted. A pacemaker is a small device that is placed under the skin of your chest or abdomen to help control abnormal heart rhythms. This device uses electrical pulses to prompt the heart to beat at a normal rate. Pacemakers are used to treat heart rhythms that are too slow. Wire (leads) are attached to the pacemaker that goes into the chambers of you heart. This is done in the hospital and usually requires and overnight stay. Please see the instructions below.   Follow-Up: At [facility], you and your health needs are our priority.  As part of our continuing mission to provide you with exceptional heart care, we have created designated Provider Care Teams.  These Care Teams include your primary Cardiologist (physician) and Advanced Practice Providers (APPs -  Physician Assistants and Nurse Practitioners) who all work together to provide you with the care you need, when you need it.  We recommend signing up for the patient portal called "MyChart".  Sign up information is provided on this After Visit Summary.  MyChart is used to connect with patients for Virtual Visits (Telemedicine).  Patients are able to view lab/test results, encounter notes, upcoming appointments, etc.  Non-urgent  messages can be sent to your provider as well.   To learn more about what you can do with MyChart, go to [email or web address].    Your next appointment:   2 week(s)  The format for your next appointment:   In Person  Provider:   device clinic for wound check   Thank you for choosing [facility]!!   Trinidad Curet, RN [phone]    Other Instructions   Implantable Device Instructions  You are scheduled for: Pacemaker implant on 08/10/2020 with Dr. Curt Bears.  1.   Pre procedure testing-             A.  LAB WORK--- On 08/04/20 .  You do not need to be fasting.                B. COVID TEST-- On 08/08/2020 @ 10:15 am  - This is a Drive Up Visit at [address] [place], [place] [address].  Someone will direct you to the appropriate testing line. Stay in your car and someone will be with you shortly.   After you are tested please go home and self quarantine until the day of your procedure.    2. On the day of your procedure 08/10/2020 you will go to [facility] [phone]) at 11:30 am.  Dennis Bast will go to the main entrance [address] [employer]) and enter where the [employer] are.  You will check in at ADMITTING.  You may have one support person come in to the hospital with you.  They  will be asked to wait in the waiting room.   3.   Do not eat or drink after midnight prior to your procedure.   4.   On the morning of your procedure do NOT take any medication.  5.  The night before your procedure and the morning of your procedure scrub your neck/chest with surgical scrub.  An instruction letter is included with this letter below   5.  Plan for an overnight stay.  If you use your phone frequently bring your phone charger.  When you are discharged you will need someone to drive you home.   6.  You will follow up with the [facility] 10-14 days after your procedure. You will follow up with Dr. Curt Bears 91 days after your procedure.  These appointments  will be made for you.   * If you have ANY questions after you get home, please call the office [phone] and ask for Arnella Pralle RN or send a MyChart message.    Pacemaker Implantation, Adult Pacemaker implantation is a procedure to place a pacemaker inside your chest. A pacemaker is a small computer that sends electrical signals to the heart and helps your heart beat normally. A pacemaker also stores information about your heart rhythms. You may need pacemaker implantation if you:  Have a slow heartbeat (bradycardia).  Faint (syncope).  Have shortness of breath (dyspnea) due to heart problems.  The pacemaker attaches to your heart through a wire, called a lead. Sometimes just one lead is needed. Other times, there will be two leads. There are two types of pacemakers:  Transvenous pacemaker. This type is placed under the skin or muscle of your chest. The lead goes through a vein in the chest area to reach the inside of the heart.  Epicardial pacemaker. This type is placed under the skin or muscle of your chest or belly. The lead goes through your chest to the outside of the heart.  Tell a health care provider about:  Any allergies you have.  All medicines you are taking, including vitamins, herbs, eye drops, creams, and over-the-counter medicines.  Any problems you or family members have had with anesthetic medicines.  Any blood or bone disorders you have.  Any surgeries you have had.  Any medical conditions you have.  Whether you are pregnant or may be pregnant. What are the risks? Generally, this is a safe procedure. However, problems may occur, including:  Infection.  Bleeding.  Failure of the pacemaker or the lead.  Collapse of a lung or bleeding into a lung.  Blood clot inside a blood vessel with a lead.  Damage to the heart.  Infection inside the heart (endocarditis).  Allergic reactions to medicines.  What happens before the procedure? Staying  hydrated Follow instructions from your health care provider about hydration, which may include:  Up to 2 hours before the procedure - you may continue to drink clear liquids, such as water, clear fruit juice, black coffee, and plain tea.  Eating and drinking restrictions Follow instructions from your health care provider about eating and drinking, which may include:  8 hours before the procedure - stop eating heavy meals or foods such as meat, fried foods, or fatty foods.  6 hours before the procedure - stop eating light meals or foods, such as toast or cereal.  6 hours before the procedure - stop drinking milk or drinks that contain milk.  2 hours before the procedure - stop drinking clear liquids.  Medicines  Ask your health care provider about: ? Changing or stopping your regular medicines. This is especially important if you are taking diabetes medicines or blood thinners. ? Taking medicines such as aspirin and ibuprofen. These medicines can thin your blood. Do not take these medicines before your procedure if your health care provider instructs you not to.  You may be given antibiotic medicine to help prevent infection. General instructions  You will have a heart evaluation. This may include an electrocardiogram (ECG), chest X-ray, and heart imaging (echocardiogram,  or echo) tests.  You will have blood tests.  Do not use any products that contain nicotine or tobacco, such as cigarettes and e-cigarettes. If you need help quitting, ask your health care provider.  Plan to have someone take you home from the hospital or clinic.  If you will be going home right after the procedure, plan to have someone with you for 24 hours.  Ask your health care provider how your surgical site will be marked or identified. What happens during the procedure?  To reduce your risk of infection: ? Your health care team will wash or sanitize their hands. ? Your skin will be washed with  soap. ? Hair may be removed from the surgical area.  An IV tube will be inserted into one of your veins.  You will be given one or more of the following: ? A medicine to help you relax (sedative). ? A medicine to numb the area (local anesthetic). ? A medicine to make you fall asleep (general anesthetic).  If you are getting a transvenous pacemaker: ? An incision will be made in your upper chest. ? A pocket will be made for the pacemaker. It may be placed under the skin or between layers of muscle. ? The lead will be inserted into a blood vessel that returns to the heart. ? While X-rays are taken by an imaging machine (fluoroscopy), the lead will be advanced through the vein to the inside of your heart. ? The other end of the lead will be tunneled under the skin and attached to the pacemaker.  If you are getting an epicardial pacemaker: ? An incision will be made near your ribs or breastbone (sternum) for the lead. ? The lead will be attached to the outside of your heart. ? Another incision will be made in your chest or upper belly to create a pocket for the pacemaker. ? The free end of the lead will be tunneled under the skin and attached to the pacemaker.  The transvenous or epicardial pacemaker will be tested. Imaging studies may be done to check the lead position.  The incisions will be closed with stitches (sutures), adhesive strips, or skin glue.  Bandages (dressing) will be placed over the incisions. The procedure may vary among health care providers and hospitals. What happens after the procedure?  Your blood pressure, heart rate, breathing rate, and blood oxygen level will be monitored until the medicines you were given have worn off.  You will be given antibiotics and pain medicine.  ECG and chest x-rays will be done.  You will wear a continuous type of ECG (Holter monitor) to check your heart rhythm.  Your health care provider will program the pacemaker.  Do not  drive for 24 hours if you received a sedative. This information is not intended to replace advice given to you by your health care provider. Make sure you discuss any questions you have with your health care provider. Document  Released: 11/02/2002 Document Revised: 06/01/2016 Document Reviewed: 04/25/2016 Elsevier Interactive Patient Education  2018 [employer].     Pacemaker Implantation, Adult, Care After This sheet gives you information about how to care for yourself after your procedure. Your health care provider may also give you more specific instructions. If you have problems or questions, contact your health care provider. What can I expect after the procedure? After the procedure, it is common to have:  Mild pain.  Slight bruising.  Some swelling over the incision.  A slight bump over the skin where the device was placed. Sometimes, it is possible to feel the device under the skin. This is normal.  Follow these instructions at home: Medicines  Take over-the-counter and prescription medicines only as told by your health care provider.  If you were prescribed an antibiotic medicine, take it as told by your health care provider. Do not stop taking the antibiotic even if you start to feel better. Wound care  Do not remove the bandage on your chest until directed to do so by your health care provider.  After your bandage is removed, you may see pieces of tape called skin adhesive strips over the area where the cut was made (incision site). Let them fall off on their own.  Check the incision site every day to make sure it is not infected, bleeding, or starting to pull apart.  Do not use lotions or ointments near the incision site unless directed to do so.  Keep the incision area clean and dry for 2-3 days after the procedure or as directed by your health care provider. It takes several weeks for the incision site to completely heal.  Do not take baths, swim, or use a hot  tub for 7-10 days or as otherwise directed by your health care provider. Activity  Do not drive or use heavy machinery while taking prescription pain medicine.  Do not drive for 24 hours if you were given a medicine to help you relax (sedative).  Check with your health care provider before you start to drive or play sports.  Avoid sudden jerking, pulling, or chopping movements that pull your upper arm far away from your body. Avoid these movements for at least 6 weeks or as long as told by your health care provider.  Do not lift your upper arm above your shoulders for at least 6 weeks or as long as told by your health care provider. This means no tennis, golf, or swimming.  You may go back to work when your health care provider says it is okay. Pacemaker care  You may be shown how to transfer data from your pacemaker through the phone to your health care provider.  Always let all health care providers know about your pacemaker before you have any medical procedures or tests.  Wear a medical ID bracelet or necklace stating that you have a pacemaker. Carry a pacemaker ID card with you at all times.  Your pacemaker battery will last for 5-15 years. Routine checks by your health care provider will let the health care provider know when the battery is starting to run down. The pacemaker will need to be replaced when the battery starts to run down.  Do not use amateur Chief of Staff. Other electrical devices are safe to use, including power tools, lawn mowers, and speakers. If you are unsure of whether something is safe to use, ask your health care provider.  When using your cell phone,  hold it to the ear opposite the pacemaker. Do not leave your cell phone in a pocket over the pacemaker.  Avoid places or objects that have a strong electric or magnetic field, including: ? Airport Herbalist. When at the airport, let officials know that you have a  pacemaker. ? Power plants. ? Large electrical generators. ? Radiofrequency transmission towers, such as cell phone and radio towers. General instructions  Weigh yourself every day. If you suddenly gain weight, fluid may be building up in your body.  Keep all follow-up visits as told by your health care provider. This is important. Contact a health care provider if:  You gain weight suddenly.  Your legs or feet swell.  It feels like your heart is fluttering or skipping beats (heart palpitations).  You have chills or a fever.  You have more redness, swelling, or pain around your incisions.  You have more fluid or blood coming from your incisions.  Your incisions feel warm to the touch.  You have pus or a bad smell coming from your incisions. Get help right away if:  You have chest pain.  You have trouble breathing or are short of breath.  You become extremely tired.  You are light-headed or you faint. This information is not intended to replace advice given to you by your health care provider. Make sure you discuss any questions you have with your health care provider. Document Released: 06/01/2005 Document Revised: 08/24/2016 Document Reviewed: 08/24/2016 Elsevier Interactive Patient Education  2018 [place] Discharge Instructions for  Pacemaker/Defibrillator Patients  ACTIVITY No heavy lifting or vigorous activity with your left/right arm for 6 to 8 weeks.  Do not raise your left/right arm above your head for one week.  Gradually raise your affected arm as drawn below.           __  NO DRIVING for     ; you may begin driving on     .  WOUND CARE - Keep the wound area clean and dry.  Do not get this area wet for one week. No showers for one week; you may shower on     . - The tape/steri-strips on your wound will fall off; do not pull them off.  No bandage is needed on the site.  DO  NOT apply any creams, oils, or ointments to the wound  area. - If you notice any drainage or discharge from the wound, any swelling or bruising at the site, or you develop a fever > 101? F after you are discharged home, call the office at once.  SPECIAL INSTRUCTIONS - You are still able to use cellular telephones; use the ear opposite the side where you have your pacemaker/defibrillator.  Avoid carrying your cellular phone near your device. - When traveling through airports, show security personnel your identification card to avoid being screened in the metal detectors.  Ask the security personnel to use the hand wand. - Avoid arc welding equipment, MRI testing (magnetic resonance imaging), TENS units (transcutaneous nerve stimulators).  Call the office for questions about other devices. - Avoid electrical appliances that are in poor condition or are not properly grounded. - Microwave ovens are safe to be near or to operate.  ADDITIONAL INFORMATION FOR DEFIBRILLATOR PATIENTS SHOULD YOUR DEVICE GO OFF: - If your device goes off ONCE and you feel fine afterward, notify the device clinic nurses. - If your device goes off ONCE and you do not feel well afterward,  call 911. - If your device goes off TWICE, call 911. - If your device goes off [place], call 911.  DO NOT DRIVE YOURSELF OR A FAMILY MEMBER WITH A DEFIBRILLATOR TO THE HOSPITAL--CALL 911.

## 2020-08-04 NOTE — H&P (View-Only) (Signed)
Electrophysiology Office Note   Date:  08/04/2020   ID:  Edwin POEHLMAN, DOB 1961/08/19, MRN [id number]  PCP:  Unk Pinto, MD  Cardiologist:  Gwenlyn Found Primary Electrophysiologist:  Dezeray Puccio Meredith Leeds, MD    Chief Complaint: palpitations   History of Present Illness: Edwin Webster is a 59 y.o. male who is being seen today for the evaluation of SVT at the request of Lorretta Harp, MD. Presenting today for electrophysiology evaluation.  He has a history of COPD, hyperlipidemia, and Walden Strom's macroglobulin globule anemia.  He wore a 2-week Zio patch that showed episodes of sinus bradycardia with heart rates into the 30s to 40s.  This occurred during daytime hours.  He had complaints of fatigue and dyspnea on exertion over the last 3 to 4 months with occasional dizziness.  Today, he denies symptoms of palpitations, chest pain, shortness of breath, orthopnea, PND, lower extremity edema, claudication, presyncope, syncope, bleeding, or neurologic sequela. The patient is tolerating medications without difficulties.    Past Medical History:  Diagnosis Date  . Allergy   . Anemia   . Anxiety   . Arthritis   . Chest pain, non-cardiac   . COPD (chronic obstructive pulmonary disease) ([place])   . Counseling regarding goals of care 03/27/2018  . Depression   . Diverticulitis   . Diverticulosis   . Fractured pelvis ([place])    fall from ladder  . GERD (gastroesophageal reflux disease)   . Gout   . History of shingles   . Hyperlipidemia   . Internal hemorrhoids   . Iron deficiency anemia 03/13/2018  . Kidney stones   . Other abnormal glucose 10/11/2015  . Rheumatoid arthritis ([place])    hands  . 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 COLONOSCOPY    . FINGER SURGERY    . iron infusion    . KNEE SURGERY     right  . ROTATOR CUFF REPAIR  1996   left     Current Outpatient Medications  Medication Sig Dispense Refill   . ALPRAZolam (XANAX) 1 MG tablet Take 1/2 to 1 tablet 2 to 3 x /day ONLY if needed for Anxiety Attack(s) 90 tablet 1  . aspirin EC 81 MG tablet Take 81 mg daily by mouth.    . busPIRone (BUSPAR) 5 MG tablet Take 1/2 to 1 tablet 3 x /day for Anxiety 270 tablet 1  . Cholecalciferol (VITAMIN D-3) 5000 UNITS TABS Take 10,000 Units by mouth daily.     Marland Kitchen escitalopram (LEXAPRO) 20 MG tablet Take 1 tablet Daily for Mood 90 tablet 0  . famciclovir (FAMVIR) 500 MG tablet TAKE 1 TABLET BY MOUTH EVERY DAY 90 tablet 3  . famotidine (PEPCID) 40 MG tablet Take 1 tabet Daily for Heartburn and Indigestion 90 tablet 3  . fluticasone (FLONASE) 50 MCG/ACT nasal spray Instill 2 sprays each Nares 2 x /day 48 g 3  . folic acid (FOLVITE) 1 MG tablet Take 1 tablet (1 mg total) by mouth daily. 90 tablet 2  . gabapentin (NEURONTIN) 300 MG capsule TAKE 1 CAPSULE (300 MG TOTAL) BY MOUTH 3 (THREE) TIMES DAILY AS NEEDED. FOR SHINGLES PAIN 270 capsule 1  . ibuprofen (ADVIL,MOTRIN) 800 MG tablet Take 1 tablet (800 mg total) by mouth daily as needed. 90 tablet 1  . IMBRUVICA 280 MG TABS TAKE 1 TABLET (280 MG) BY MOUTH DAILY. 28 tablet 3  . levocetirizine (XYZAL) 5 MG tablet Take  1 tablet Daily for Allergies 90 tablet 3  . olmesartan (BENICAR) 40 MG tablet Take 1 tablet at Bedtime  for BP 90 tablet 3  . rosuvastatin (CRESTOR) 40 MG tablet Take 1 tablet Daily for Cholesterol 90 tablet 3  . triamcinolone ointment (KENALOG) 0.1 % Apply 1 application topically 2 (two) times daily. 80 g 1  . vitamin B-12 (CYANOCOBALAMIN) 500 MCG tablet Take 500 mcg by mouth daily.     No current facility-administered medications for this visit.    Allergies:   Adalimumab   Social History:  The patient  reports that he quit smoking about 10 years ago. His smoking use included cigarettes. He smoked 2.00 packs per day. He has never used smokeless tobacco. He reports that he does not drink alcohol and does not use drugs.   Family History:  The  patient's family history includes COPD in his maternal grandfather and mother; Cancer in his mother; Colon cancer (age of onset: 62) in his maternal grandfather; Heart failure in his mother; Hyperlipidemia in his brother and mother; Hypertension in his father; Stroke in his maternal grandfather.    ROS:  Please see the history of present illness.   Otherwise, review of systems is positive for none.   All other systems are reviewed and negative.    PHYSICAL EXAM: VS:  BP 120/72   Pulse (!) 55   Ht 5\' 11"  (1.803 m)   Wt 179 lb (81.2 kg)   SpO2 95%   BMI 24.97 kg/m  , BMI Body mass index is 24.97 kg/m. GEN: Well nourished, well developed, in no acute distress  HEENT: normal  Neck: no JVD, carotid bruits, or masses Cardiac: RRR; no murmurs, rubs, or gallops,no edema  Respiratory:  clear to auscultation bilaterally, normal work of breathing GI: soft, nontender, nondistended, + BS MS: no deformity or atrophy  Skin: warm and dry Neuro:  Strength and sensation are intact Psych: euthymic mood, full affect  EKG:  EKG is not ordered today. Personal review of the ekg ordered 07/15/20 shows sinus rhythm, rate 44  Recent Labs: 02/22/2020: Magnesium 2.1; TSH 2.45 06/22/2020: ALT 37; Hemoglobin 13.5; Platelet Count 187 07/15/2020: BUN 15; Creatinine, Ser 1.37; Potassium 5.1; Sodium 139    Lipid Panel     Component Value Date/Time   CHOL 154 02/22/2020 1132   TRIG 56 02/22/2020 1132   HDL 49 02/22/2020 1132   CHOLHDL 3.1 02/22/2020 1132   VLDL 12 04/16/2017 1712   LDLCALC 91 02/22/2020 1132     Wt Readings from Last 3 Encounters:  08/04/20 179 lb (81.2 kg)  07/15/20 178 lb 9.6 oz (81 kg)  06/22/20 179 lb 12 oz (81.5 kg)      Other studies Reviewed: Additional studies/ records that were reviewed today include: TTE 07/27/20  Review of the above records today demonstrates:  1. Left ventricular ejection fraction, by estimation, is 55 to 60%. The  left ventricle has normal function.  The left ventricle has no regional  wall motion abnormalities. Left ventricular diastolic parameters were  normal.  2. Right ventricular systolic function is normal. The right ventricular  size is normal.  3. The mitral valve is normal in structure. No evidence of mitral valve  regurgitation. No evidence of mitral stenosis.  4. The aortic valve is normal in structure. Aortic valve regurgitation is  mild. Mild aortic valve sclerosis is present, with no evidence of aortic  valve stenosis.  5. The inferior vena cava is normal in size with  greater than 50%  respiratory variability, suggesting right atrial pressure of 3 mmHg.   Monitor 07/10/20 personally reviewed 1. SR/SB/ST 2. Occasional PVCs/PACs 3. Short runs of SVT  ASSESSMENT AND PLAN:  1.  Sick sinus syndrome: Wore a 2-week monitor that showed heart rates in the 30s and 40s.  He does have complaints of fatigue and dyspnea on exertion.  Currently on no chronotropic medications.  He would likely benefit from pacemaker implant.  Risks and benefits were discussed include bleeding, tamponade, infection, pneumothorax, lead dislodgment, among others.  The patient understands these risks and has agreed to the procedure.  2.  Hypertension: Currently well controlled  3.  Hyperlipidemia: Continue Crestor per primary cardiology and primary care.  Case discussed with primary cardiology  Current medicines are reviewed at length with the patient today.   The patient does not have concerns regarding his medicines.  The following changes were made today:  none  Labs/ tests ordered today include:  Orders Placed This Encounter  Procedures  . Basic metabolic panel  . CBC     Disposition:   FU with Sherrill Mckamie 3 months  Signed, Adreana Coull Meredith Leeds, MD  08/04/2020 12:06 PM     [facility] [address] [place] [address] [phone] (office) [phone] (fax)

## 2020-08-06 DIAGNOSIS — I2510 Atherosclerotic heart disease of native coronary artery without angina pectoris: Secondary | ICD-10-CM | POA: Diagnosis not present

## 2020-08-08 ENCOUNTER — Other Ambulatory Visit (HOSPITAL_COMMUNITY)
Admission: RE | Admit: 2020-08-08 | Discharge: 2020-08-08 | Disposition: A | Discharge status: Home / Self Care | Payer: 59 | Source: Ambulatory Visit | Attending: Cardiology | Admitting: Cardiology

## 2020-08-08 DIAGNOSIS — Z01812 Encounter for preprocedural laboratory examination: Secondary | ICD-10-CM | POA: Diagnosis present

## 2020-08-08 DIAGNOSIS — Z20822 Contact with and (suspected) exposure to covid-19: Secondary | ICD-10-CM | POA: Diagnosis not present

## 2020-08-08 LAB — SARS CORONAVIRUS 2 (TAT 6-24 HRS): SARS Coronavirus 2: NEGATIVE

## 2020-08-09 ENCOUNTER — Telehealth: Payer: Self-pay | Admitting: *Deleted

## 2020-08-09 NOTE — Progress Notes (Signed)
Attempted to call patient regarding procedure instructions for the morning.    Left voicemail-nothing to eat of drink after midnight, no meds in the morning, wash with surgical scrub tonight and in the morning.  You need a responsible adult to drive you home as well as stay overnight with you.

## 2020-08-09 NOTE — Telephone Encounter (Signed)
Pt made aware of scheduling change tomorrow.  Aware to arrive to [facility] tomorrow morning at 9:30 am. Pt agreeable to plan.

## 2020-08-10 ENCOUNTER — Ambulatory Visit (HOSPITAL_COMMUNITY): Payer: 59

## 2020-08-10 ENCOUNTER — Ambulatory Visit (HOSPITAL_COMMUNITY)
Admission: RE | Admit: 2020-08-10 | Discharge: 2020-08-10 | Disposition: A | Discharge status: Home / Self Care | Payer: 59 | Attending: Cardiology | Admitting: Cardiology

## 2020-08-10 ENCOUNTER — Other Ambulatory Visit: Payer: Self-pay

## 2020-08-10 ENCOUNTER — Encounter (HOSPITAL_COMMUNITY)
Admission: RE | Disposition: A | Discharge status: Home / Self Care | Payer: 59 | Source: Home / Self Care | Attending: Cardiology

## 2020-08-10 DIAGNOSIS — I495 Sick sinus syndrome: Secondary | ICD-10-CM | POA: Insufficient documentation

## 2020-08-10 DIAGNOSIS — Z7982 Long term (current) use of aspirin: Secondary | ICD-10-CM | POA: Diagnosis not present

## 2020-08-10 DIAGNOSIS — M109 Gout, unspecified: Secondary | ICD-10-CM | POA: Insufficient documentation

## 2020-08-10 DIAGNOSIS — F419 Anxiety disorder, unspecified: Secondary | ICD-10-CM | POA: Insufficient documentation

## 2020-08-10 DIAGNOSIS — J449 Chronic obstructive pulmonary disease, unspecified: Secondary | ICD-10-CM | POA: Insufficient documentation

## 2020-08-10 DIAGNOSIS — D509 Iron deficiency anemia, unspecified: Secondary | ICD-10-CM | POA: Diagnosis not present

## 2020-08-10 DIAGNOSIS — Z87891 Personal history of nicotine dependence: Secondary | ICD-10-CM | POA: Insufficient documentation

## 2020-08-10 DIAGNOSIS — E785 Hyperlipidemia, unspecified: Secondary | ICD-10-CM | POA: Insufficient documentation

## 2020-08-10 DIAGNOSIS — M069 Rheumatoid arthritis, unspecified: Secondary | ICD-10-CM | POA: Diagnosis not present

## 2020-08-10 DIAGNOSIS — F329 Major depressive disorder, single episode, unspecified: Secondary | ICD-10-CM | POA: Insufficient documentation

## 2020-08-10 DIAGNOSIS — Z95818 Presence of other cardiac implants and grafts: Secondary | ICD-10-CM

## 2020-08-10 DIAGNOSIS — I10 Essential (primary) hypertension: Secondary | ICD-10-CM | POA: Diagnosis not present

## 2020-08-10 DIAGNOSIS — K219 Gastro-esophageal reflux disease without esophagitis: Secondary | ICD-10-CM | POA: Insufficient documentation

## 2020-08-10 DIAGNOSIS — Z79899 Other long term (current) drug therapy: Secondary | ICD-10-CM | POA: Insufficient documentation

## 2020-08-10 HISTORY — PX: PACEMAKER IMPLANT: EP1218

## 2020-08-10 SURGERY — PACEMAKER IMPLANT

## 2020-08-10 MED ORDER — CHLORHEXIDINE GLUCONATE 4 % EX LIQD
4.0000 "application " | Freq: Once | CUTANEOUS | Status: DC
  Filled 2020-08-10: qty 60

## 2020-08-10 MED ORDER — FENTANYL CITRATE (PF) 100 MCG/2ML IJ SOLN
INTRAMUSCULAR | Status: DC | PRN
Start: 2020-08-10 — End: 2020-08-10
  Administered 2020-08-10 (×2): 25 ug via INTRAVENOUS

## 2020-08-10 MED ORDER — ONDANSETRON HCL 4 MG/2ML IJ SOLN: 4.0000 mg | Freq: Four times a day (QID) | INTRAMUSCULAR | Status: DC | PRN

## 2020-08-10 MED ORDER — HEPARIN (PORCINE) IN NACL 1000-0.9 UT/500ML-% IV SOLN
INTRAVENOUS | Status: DC | PRN
  Administered 2020-08-10: 500 mL

## 2020-08-10 MED ORDER — CEFAZOLIN SODIUM-DEXTROSE 2-4 GM/100ML-% IV SOLN
INTRAVENOUS | Status: AC
  Filled 2020-08-10: qty 100

## 2020-08-10 MED ORDER — CEFAZOLIN SODIUM-DEXTROSE 1-4 GM/50ML-% IV SOLN
1.0000 g | Freq: Four times a day (QID) | INTRAVENOUS | Status: DC
  Filled 2020-08-10: qty 50

## 2020-08-10 MED ORDER — HEPARIN (PORCINE) IN NACL 1000-0.9 UT/500ML-% IV SOLN
INTRAVENOUS | Status: AC
  Filled 2020-08-10: qty 500

## 2020-08-10 MED ORDER — ACETAMINOPHEN 325 MG PO TABS: 325.0000 mg | ORAL_TABLET | ORAL | Status: DC | PRN

## 2020-08-10 MED ORDER — MIDAZOLAM HCL 5 MG/5ML IJ SOLN
INTRAMUSCULAR | Status: DC | PRN
  Administered 2020-08-10 (×2): 1 mg via INTRAVENOUS

## 2020-08-10 MED ORDER — SODIUM CHLORIDE 0.9 % IV SOLN
INTRAVENOUS | Status: AC
  Filled 2020-08-10: qty 2

## 2020-08-10 MED ORDER — SODIUM CHLORIDE 0.9 % IV SOLN: INTRAVENOUS | Status: DC

## 2020-08-10 MED ORDER — CEFAZOLIN SODIUM-DEXTROSE 2-4 GM/100ML-% IV SOLN
2.0000 g | INTRAVENOUS | Status: AC
  Administered 2020-08-10: 2 g via INTRAVENOUS

## 2020-08-10 MED ORDER — FENTANYL CITRATE (PF) 100 MCG/2ML IJ SOLN
INTRAMUSCULAR | Status: AC
  Filled 2020-08-10: qty 2

## 2020-08-10 MED ORDER — SODIUM CHLORIDE 0.9 % IV SOLN
80.0000 mg | INTRAVENOUS | Status: AC
  Administered 2020-08-10: 80 mg

## 2020-08-10 MED ORDER — LIDOCAINE HCL (PF) 1 % IJ SOLN
INTRAMUSCULAR | Status: DC | PRN
  Administered 2020-08-10: 50 mL

## 2020-08-10 MED ORDER — LIDOCAINE HCL 1 % IJ SOLN
INTRAMUSCULAR | Status: AC
  Filled 2020-08-10: qty 60

## 2020-08-10 MED ORDER — MIDAZOLAM HCL 5 MG/5ML IJ SOLN
INTRAMUSCULAR | Status: AC
  Filled 2020-08-10: qty 5

## 2020-08-10 SURGICAL SUPPLY — 8 items
CABLE SURGICAL S-101-97-12 (CABLE) ×3 IMPLANT
LEAD TENDRIL MRI 52CM LPA1200M (Lead) ×2 IMPLANT
LEAD TENDRIL MRI 58CM LPA1200M (Lead) ×2 IMPLANT
PACEMAKER ASSURITY DR-RF (Pacemaker) ×2 IMPLANT
PAD PRO RADIOLUCENT 2001M-C (PAD) ×3 IMPLANT
SHEATH 8FR PRELUDE SNAP 13 (SHEATH) ×4 IMPLANT
SHEATH PROBE COVER 6X72 (BAG) ×2 IMPLANT
TRAY PACEMAKER INSERTION (PACKS) ×3 IMPLANT

## 2020-08-10 NOTE — Discharge Instructions (Signed)
° ° °  Supplemental Discharge Instructions for  Pacemaker/Defibrillator Patients  Tomorrow, 08/11/20, PLEASE SEND A REMOTE DEVICE TRANSMISSION     Activity No heavy lifting or vigorous activity with your left arm for 6 to 8 weeks.  Do not raise your left/right arm above your head for one week.  Gradually raise your affected arm as drawn below.             08/14/2020                 08/15/2020                08/16/2020               08/17/2020 __   NO DRIVING until your wound check visit  WOUND CARE - Keep the wound area clean and dry.  Do not get this area wet, no showers until cleared to at your wound check visit - Tomorrow, 08/11/2020, stop wearing the arm sling - Tomorrow, 08/11/2020, remove the outer plastic dressing.  Underneath the plastic dressing there are steri-strips (paper tapes) covering the wound, DO NOT remove these - The tape/steri-strips on your wound will fall off; do not pull them off.  No bandage is needed on the site.  DO  NOT apply any creams, oils, or ointments to the wound area. - If you notice any drainage or discharge from the wound, any swelling or bruising at the site, or you develop a fever > 101? F after you are discharged home, call the office at once.  Special Instructions - You are still able to use cellular telephones; use the ear opposite the side where you have your pacemaker/defibrillator.  Avoid carrying your cellular phone near your device. - When traveling through airports, show security personnel your identification card to avoid being screened in the metal detectors.  Ask the security personnel to use the hand wand. - Avoid arc welding equipment, MRI testing (magnetic resonance imaging), TENS units (transcutaneous nerve stimulators).  Call the office for questions about other devices. - Avoid electrical appliances that are in poor condition or are not properly grounded. - Microwave ovens are safe to be near or to operate.

## 2020-08-10 NOTE — Progress Notes (Signed)
Pt leaving for cxr

## 2020-08-10 NOTE — Interval H&P Note (Signed)
History and Physical Interval Note:  Edwin Webster has presented today for surgery, with the diagnosis of sick sinus syndrome.  The various methods of treatment have been discussed with the patient and family. After consideration of risks, benefits and other options for treatment, the patient has consented to  Procedure(s): Pacemaker implant as a surgical intervention .  Risks include but not limited to bleeding, tamponade, infection, pneumothorax, lead dislodgement, among others. The patient's history has been reviewed, patient examined, no change in status, stable for surgery.  I have reviewed the patient's chart and labs.  Questions were answered to the patient's satisfaction.    Marjorie Lussier Curt Bears, MD 08/10/2020 11:33 AM

## 2020-08-11 ENCOUNTER — Encounter (HOSPITAL_COMMUNITY): Payer: Self-pay | Admitting: Cardiology

## 2020-08-11 ENCOUNTER — Telehealth: Payer: Self-pay | Admitting: *Deleted

## 2020-08-11 MED FILL — Lidocaine HCl Local Inj 1%: INTRAMUSCULAR | Qty: 60 | Status: AC

## 2020-08-11 NOTE — Telephone Encounter (Signed)
Follow-up after same day discharge: Implant date:  08/10/20 MD: Allegra Lai, MD Device:  Abbott PPM Location:  Left chest    Wound check visit:  08/23/20 at 2:30pm 91 day MD follow-up:  11/24/20 at 3:00pm  Remote Transmission received:  Yes, normal PPM function.  No alerts or episodes.  Dressing removed:  Not yet, removing tonight.  Educated pt regarding wound care, activity progression, and Merlin monitor.  Pt aware to call prior to wound check with any concerns.

## 2020-08-11 NOTE — Telephone Encounter (Signed)
-----   Message from [facility], [place] sent at 08/10/2020  6:04 PM EDT ----- Same day d/c SJM PPM WC

## 2020-08-15 ENCOUNTER — Telehealth: Payer: Self-pay | Admitting: Cardiovascular Disease

## 2020-08-15 ENCOUNTER — Telehealth: Payer: Self-pay | Admitting: Cardiology

## 2020-08-15 NOTE — Telephone Encounter (Signed)
Patient is concerned about his ppm function because he woke up with his arm across his chest . Remote transmission sent that showed device function WNL and no episodes or alerts noted on transmission. Patient reassured  And appointment for wound check 08/23/20 confirmed.

## 2020-08-15 NOTE — Telephone Encounter (Signed)
New message:      Patient calling concering his pacer. Please call patient back.

## 2020-08-15 NOTE — Telephone Encounter (Signed)
encounter not needed

## 2020-08-17 ENCOUNTER — Ambulatory Visit: Payer: 59 | Admitting: Cardiovascular Disease

## 2020-08-17 ENCOUNTER — Other Ambulatory Visit: Payer: Self-pay | Admitting: Internal Medicine

## 2020-08-23 ENCOUNTER — Other Ambulatory Visit: Payer: Self-pay

## 2020-08-23 ENCOUNTER — Ambulatory Visit (INDEPENDENT_AMBULATORY_CARE_PROVIDER_SITE_OTHER): Payer: 59 | Admitting: Emergency Medicine

## 2020-08-23 DIAGNOSIS — Z950 Presence of cardiac pacemaker: Secondary | ICD-10-CM | POA: Diagnosis not present

## 2020-08-23 DIAGNOSIS — R001 Bradycardia, unspecified: Secondary | ICD-10-CM | POA: Diagnosis not present

## 2020-08-23 LAB — CUP PACEART INCLINIC DEVICE CHECK
Battery Remaining Longevity: 134 mo
Battery Voltage: 3.13 V
Brady Statistic RA Percent Paced: 63 %
Brady Statistic RV Percent Paced: 0.11 %
Date Time Interrogation Session: 20210928152327
Implantable Lead Implant Date: 20210915
Implantable Lead Implant Date: 20210915
Implantable Lead Location: 753859
Implantable Lead Location: 753860
Implantable Pulse Generator Implant Date: 20210915
Lead Channel Impedance Value: 437.5 Ohm
Lead Channel Impedance Value: 525 Ohm
Lead Channel Pacing Threshold Amplitude: 0.5 V
Lead Channel Pacing Threshold Amplitude: 0.75 V
Lead Channel Pacing Threshold Pulse Width: 0.5 ms
Lead Channel Pacing Threshold Pulse Width: 0.5 ms
Lead Channel Sensing Intrinsic Amplitude: 11.5 mV
Lead Channel Sensing Intrinsic Amplitude: 2.4 mV
Lead Channel Setting Pacing Amplitude: 1.5 V
Lead Channel Setting Pacing Amplitude: 3.5 V
Lead Channel Setting Pacing Pulse Width: 0.5 ms
Lead Channel Setting Sensing Sensitivity: 2 mV
Pulse Gen Model: 2272
Pulse Gen Serial Number: 3864545

## 2020-08-23 MED FILL — IMBRUVICA 280 MG TAB: 280 | 28 days supply | Qty: 28 | Fill #1

## 2020-08-23 NOTE — Progress Notes (Signed)
Wound check appointment. Steri-strips removed. Wound without redness or edema. Incision edges approximated, wound well healed. Normal device function. Thresholds, sensing, and impedances consistent with implant measurements. Device programmed at 3.5V/auto capture programmed on for extra safety margin until 3 month visit. Histogram distribution appropriate for patient and level of activity. No mode switches or high ventricular rates noted. Patient educated about wound care, arm mobility, lifting restrictions. ROV with Dr. Curt Bears 11/24/20. Enrolled in remote follow-up and next remote scheduled for 11/10/20.

## 2020-08-24 ENCOUNTER — Ambulatory Visit (INDEPENDENT_AMBULATORY_CARE_PROVIDER_SITE_OTHER): Payer: 59 | Admitting: Cardiovascular Disease

## 2020-08-24 ENCOUNTER — Encounter: Payer: Self-pay | Admitting: Cardiovascular Disease

## 2020-08-24 VITALS — BP 122/78 | HR 67 | Temp 97.0°F | Ht 71.0 in | Wt 177.0 lb

## 2020-08-24 DIAGNOSIS — R0789 Other chest pain: Secondary | ICD-10-CM | POA: Diagnosis not present

## 2020-08-24 DIAGNOSIS — E782 Mixed hyperlipidemia: Secondary | ICD-10-CM | POA: Diagnosis not present

## 2020-08-24 DIAGNOSIS — R001 Bradycardia, unspecified: Secondary | ICD-10-CM | POA: Diagnosis not present

## 2020-08-24 DIAGNOSIS — I10 Essential (primary) hypertension: Secondary | ICD-10-CM | POA: Diagnosis not present

## 2020-08-24 MED ORDER — EZETIMIBE 10 MG PO TABS: 10.0000 mg | ORAL_TABLET | Freq: Every day | ORAL | 3 refills | Status: DC

## 2020-08-24 NOTE — Patient Instructions (Signed)
Medication Instructions:  Start Zetia 10 mg daily   *If you need a refill on your cardiac medications before your next appointment, please call your pharmacy*  Lab Work: LIPID/LIVER (2 months, come fasting, no lab appointment needed)  If you have labs (blood work) drawn today and your tests are completely normal, you will receive your results only by: Marland Kitchen MyChart Message (if you have MyChart) OR . A paper copy in the mail If you have any lab test that is abnormal or we need to change your treatment, we will call you to review the results.  Follow-Up: At [facility], you and your health needs are our priority.  As part of our continuing mission to provide you with exceptional heart care, we have created designated Provider Care Teams.  These Care Teams include your primary Cardiologist (physician) and Advanced Practice Providers (APPs -  Physician Assistants and Nurse Practitioners) who all work together to provide you with the care you need, when you need it.  We recommend signing up for the patient portal called "MyChart".  Sign up information is provided on this After Visit Summary.  MyChart is used to connect with patients for Virtual Visits (Telemedicine).  Patients are able to view lab/test results, encounter notes, upcoming appointments, etc.  Non-urgent messages can be sent to your provider as well.   To learn more about what you can do with MyChart, go to [email or web address].    Your next appointment:   6 months  The format for your next appointment:   In Person  Provider:   Quay Burow, MD

## 2020-08-24 NOTE — Assessment & Plan Note (Signed)
History of essential hypertension a blood pressure measured today 122/78. He is on Benicar.

## 2020-08-24 NOTE — Progress Notes (Signed)
08/24/2020 FONG MCCARRY   25-Nov-1961  [id number]  Primary Physician Unk Pinto, MD Primary Cardiologist: Lorretta Harp MD [name], [name]:  Edwin Webster is a 59 y.o.   thin appearing married Caucasian male father of 3 children, grandfather of 4 grandsons referred by Dr. Melford Aase , his PCP for symptomatic bradycardia.   I last saw him in the office 07/15/2020. He is accompanied by his wife [name] today. He is retired from doing Dealer work.  His risk factors include 30 to 40 pack years of tobacco abuse having quit 15 to 20 years ago as well as treated hypertension hyperlipidemia.  There is no family history for heart disease.  Never had a heart attack stroke.  He does get atypical chest pain lasting 15 seconds at a time.  Major complaints are of progressive disabling fatigue and dyspnea on exertion over the last 3 to 4 months with occasional dizziness.  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  A 2-week Zio patch did show sinus bradycardia in the high 30s and 40s during the daytime hours.  I referred him to Dr. Curt Bears who saw him on 07/30/2020 and ultimately ended up putting a permanent transvenous pacemaker in him on 08/10/2020. His symptoms of fatigue and dyspnea as well as atypical chest pain completely resolved almost instantaneously. He did have a coronary CTA performed in 08/03/2020 that primarily showed nonobstructive disease except in the distal circumflex coronary artery.   Current Meds  Medication Sig  . aspirin EC 81 MG tablet Take 81 mg daily by mouth.  . busPIRone (BUSPAR) 5 MG tablet Take 1/2 to 1 tablet 3 x /day for Anxiety (Patient taking differently: Take 0.5-1 mg by mouth daily as needed. )  . Cholecalciferol (VITAMIN D-3) 5000 UNITS TABS Take 5,000 Units by mouth 2 (two) times daily.   Marland Kitchen escitalopram (LEXAPRO) 20 MG tablet Take    1 tablet     Daily    for Mood  . famciclovir (FAMVIR) 500 MG tablet TAKE 1 TABLET BY MOUTH EVERY DAY (Patient taking  differently: Take 500 mg by mouth daily. )  . famotidine (PEPCID) 40 MG tablet Take 1 tabet Daily for Heartburn and Indigestion (Patient taking differently: Take 40 mg by mouth daily as needed for heartburn or indigestion. )  . folic acid (FOLVITE) 1 MG tablet Take 1 tablet (1 mg total) by mouth daily.  Marland Kitchen ibuprofen (ADVIL,MOTRIN) 800 MG tablet Take 1 tablet (800 mg total) by mouth daily as needed. (Patient taking differently: Take 800 mg by mouth daily as needed for headache or moderate pain. )  . IMBRUVICA 280 MG TABS TAKE 1 TABLET (280 MG) BY MOUTH DAILY. (Patient taking differently: Take 280 mg by mouth daily. )  . levocetirizine (XYZAL) 5 MG tablet Take 1 tablet Daily for Allergies (Patient taking differently: Take 5 mg by mouth daily. )  . olmesartan (BENICAR) 40 MG tablet Take 1 tablet at Bedtime  for BP (Patient taking differently: Take 40 mg by mouth at bedtime. )  . rosuvastatin (CRESTOR) 40 MG tablet Take 1 tablet Daily for Cholesterol (Patient taking differently: Take 40 mg by mouth daily. )  . vitamin B-12 (CYANOCOBALAMIN) 500 MCG tablet Take 500 mcg by mouth daily.     Allergies  Allergen Reactions  . Adalimumab Other (See Comments)    "blacked out", confused     Social History   Socioeconomic History  . Marital status: Married    Spouse  name: Not on file  . Number of children: Not on file  . Years of education: Not on file  . Highest education level: Not on file  Occupational History  . Not on file  Tobacco Use  . Smoking status: Former Smoker    Packs/day: 2.00    Types: Cigarettes    Quit date: 07/03/2010    Years since quitting: 10.1  . Smokeless tobacco: Never Used  Vaping Use  . Vaping Use: Never used  Substance and Sexual Activity  . Alcohol use: No    Alcohol/week: 0.0 standard drinks  . Drug use: No  . Sexual activity: Not on file  Other Topics Concern  . Not on file  Social History Narrative  . Not on file   Social Determinants of Health    Financial Resource Strain:   . Difficulty of Paying Living Expenses: Not on file  Food Insecurity:   . Worried About Charity fundraiser in the Last Year: Not on file  . Ran Out of Food in the Last Year: Not on file  Transportation Needs:   . Lack of Transportation (Medical): Not on file  . Lack of Transportation (Non-Medical): Not on file  Physical Activity:   . Days of Exercise per Week: Not on file  . Minutes of Exercise per Session: Not on file  Stress:   . Feeling of Stress : Not on file  Social Connections:   . Frequency of Communication with Friends and Family: Not on file  . Frequency of Social Gatherings with Friends and Family: Not on file  . Attends Religious Services: Not on file  . Active Member of Clubs or Organizations: Not on file  . Attends Archivist Meetings: Not on file  . Marital Status: Not on file  Intimate Partner Violence:   . Fear of Current or Ex-Partner: Not on file  . Emotionally Abused: Not on file  . Physically Abused: Not on file  . Sexually Abused: Not on file     Review of Systems: General: negative for chills, fever, night sweats or weight changes.  Cardiovascular: negative for chest pain, dyspnea on exertion, edema, orthopnea, palpitations, paroxysmal nocturnal dyspnea or shortness of breath Dermatological: negative for rash Respiratory: negative for cough or wheezing Urologic: negative for hematuria Abdominal: negative for nausea, vomiting, diarrhea, bright red blood per rectum, melena, or hematemesis Neurologic: negative for visual changes, syncope, or dizziness All other systems reviewed and are otherwise negative except as noted above.    Blood pressure 122/78, pulse 67, temperature (!) 97 F (36.1 C), height 5\' 11"  (1.803 m), weight 177 lb (80.3 kg).  General appearance: alert and no distress Neck: no adenopathy, no carotid bruit, no JVD, supple, symmetrical, trachea midline and thyroid not enlarged, symmetric, no  tenderness/mass/nodules Lungs: clear to auscultation bilaterally Heart: regular rate and rhythm, S1, S2 normal, no murmur, click, rub or gallop Extremities: extremities normal, atraumatic, no cyanosis or edema Pulses: 2+ and symmetric Skin: Skin color, texture, turgor normal. No rashes or lesions Neurologic: Alert and oriented X 3, normal strength and tone. Normal symmetric reflexes. Normal coordination and gait  EKG not performed today  ASSESSMENT AND PLAN:   Hyperlipidemia History of hyperlipidemia on high-dose Crestor with lipid profile performed 02/22/2020 revealed a total cholesterol 154, LDL 91 and HDL 49.  Hypertension History of essential hypertension a blood pressure measured today 122/78. He is on Benicar.  Sinus bradycardia History of sinus bradycardia status post permanent transvenous pacemaker implantation by  Dr. Curt Bears 08/10/2020 with marked improvement in his symptoms.  Atypical chest pain History of atypical chest pain with coronary CTA performed 08/03/2020 revealing essentially no significant obstructive disease except for the distal circumflex. His symptoms have significantly improved since pacemaker implantation.      Lorretta Harp MD FACP,FACC,FAHA, [facility] 08/24/2020 3:17 PM

## 2020-08-24 NOTE — Assessment & Plan Note (Signed)
History of sinus bradycardia status post permanent transvenous pacemaker implantation by Dr. Curt Bears 08/10/2020 with marked improvement in his symptoms.

## 2020-08-24 NOTE — Assessment & Plan Note (Signed)
History of hyperlipidemia on high-dose Crestor with lipid profile performed 02/22/2020 revealed a total cholesterol 154, LDL 91 and HDL 49.

## 2020-08-24 NOTE — Assessment & Plan Note (Signed)
History of atypical chest pain with coronary CTA performed 08/03/2020 revealing essentially no significant obstructive disease except for the distal circumflex. His symptoms have significantly improved since pacemaker implantation.

## 2020-08-30 ENCOUNTER — Telehealth: Payer: Self-pay

## 2020-08-30 NOTE — Telephone Encounter (Signed)
Transmission reviewed and sensing, impedance and thresholds noted. Advised patient if he experiences any changes to please call us. Verbalized understanding. Presenting: AS/VS 65. No episodes noted.

## 2020-08-30 NOTE — Telephone Encounter (Signed)
Patient called in and he was concerned he almost fell and caught himself and he feels like he pulled something under his left arm. Patient is sending transmission to make sure everything is ok. No chest pain or sob

## 2020-08-31 NOTE — Progress Notes (Signed)
Annual  Screening/Preventative Visit  & Comprehensive Evaluation & Examination     This very nice 59 y.o.  MWM presents for a Screening /Preventative Visit & comprehensive evaluation and management of multiple medical co-morbidities.  Patient has been followed for HTN, HLD, T2_NIDDM  Prediabetes and Vitamin D Deficiency. Patient also has GERD controlled on his meds.     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      In May 2019 , patient was dx'd withWaldenstrom's macroglobulinemiaby Dr Marin Olp and started on Chemotherapy with Imbruvica .       HTN predates circa 2013. Patient's BP has been controlled at home.  Today's BP is at goal -122/72. 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 In Sept 2021, Patient had a PPM inserted for symptomatic Bradycardia. Patient denies any cardiac symptoms as chest pain, palpitations, shortness of breath, dizziness or ankle swelling.     Patient's hyperlipidemia is controlled with diet and Rosuvastatin / Crestor. Patient denies myalgias or other medication SE's. Last lipids were at goal:  Lab Results  Component Value Date   CHOL 154 02/22/2020   HDL 49 02/22/2020   LDLCALC 91 02/22/2020   TRIG 56 02/22/2020   CHOLHDL 3.1 02/22/2020       Patient is monitored expectantly for glucose intolerance and patient denies reactive hypoglycemic symptoms, visual blurring, diabetic polys or paresthesias. Last A1c was Normal & at goal:  Lab Results  Component Value Date   HGBA1C 5.3 02/22/2020        Finally, patient has history of Vitamin D Deficiency  ("32" /2017) and last vitamin D was at goal:  Lab Results  Component Value Date   VD25OH 82 02/22/2020    Current Outpatient Medications on File Prior to Visit  Medication Sig  . aspirin EC 81 MG tablet Take 81 mg daily by mouth.  . busPIRone (BUSPAR) 5 MG tablet Take 1/2 to 1 tablet 3 x /day for Anxiety (Patient taking differently: Take  0.5-1 mg by mouth daily as needed. )  . Cholecalciferol (VITAMIN D-3) 5000 UNITS TABS Take 5,000 Units by mouth 2 (two) times daily.   Marland Kitchen escitalopram (LEXAPRO) 20 MG tablet Take    1 tablet     Daily    for Mood  . ezetimibe (ZETIA) 10 MG tablet Take 1 tablet (10 mg total) by mouth daily.  . famciclovir (FAMVIR) 500 MG tablet TAKE 1 TABLET BY MOUTH EVERY DAY (Patient taking differently: Take 500 mg by mouth daily. )  . famotidine (PEPCID) 40 MG tablet Take 1 tabet Daily for Heartburn and Indigestion (Patient taking differently: Take 40 mg by mouth daily as needed for heartburn or indigestion. )  . folic acid (FOLVITE) 1 MG tablet Take 1 tablet (1 mg total) by mouth daily.  Marland Kitchen ibuprofen (ADVIL,MOTRIN) 800 MG tablet Take 1 tablet (800 mg total) by mouth daily as needed. (Patient taking differently: Take 800 mg by mouth daily as needed for headache or moderate pain. )  . IMBRUVICA 280 MG TABS TAKE 1 TABLET (280 MG) BY MOUTH DAILY. (Patient taking differently: Take 280 mg by mouth daily. )  . levocetirizine (XYZAL) 5 MG tablet Take 1 tablet Daily for Allergies (Patient taking differently: Take 5 mg by mouth daily. )  . olmesartan (BENICAR) 40 MG tablet Take 1 tablet at Bedtime  for BP (Patient taking differently: Take 40 mg by mouth at bedtime. )  .  rosuvastatin (CRESTOR) 40 MG tablet Take 1 tablet Daily for Cholesterol (Patient taking differently: Take 40 mg by mouth daily. )  . vitamin B-12 (CYANOCOBALAMIN) 500 MCG tablet Take 500 mcg by mouth daily.   No current facility-administered medications on file prior to visit.    Allergies  Allergen Reactions  . Adalimumab Other (See Comments)    "blacked out", confused    Past Medical History:  Diagnosis Date  . Allergy   . Anemia   . Anxiety   . Arthritis   . Chest pain, non-cardiac   . COPD (chronic obstructive pulmonary disease) ([place])   . Counseling regarding goals of care 03/27/2018  . Depression   . Diverticulitis   . Diverticulosis   .  Fractured pelvis ([place])    fall from ladder  . GERD (gastroesophageal reflux disease)   . Gout   . History of shingles   . Hyperlipidemia   . Internal hemorrhoids   . Iron deficiency anemia 03/13/2018  . Kidney stones   . Other abnormal glucose 10/11/2015  . Rheumatoid arthritis ([place])    hands  . Waldenstrom's macroglobulinemia ([place]) 03/27/2018   Health Maintenance  Topic Date Due  . INFLUENZA VACCINE  06/26/2020  . COLONOSCOPY  10/12/2020  . TETANUS/TDAP  06/03/2021  . COVID-19 Vaccine  Completed  . Hepatitis C Screening  Completed  . HIV Screening  Completed   Immunization History  Administered Date(s) Administered  . DTaP 06/04/2011  . Hepatitis B 11/26/2010  . Hepatitis B, ped/adol 08/06/2011  . Influenza Inj Mdck Quad With Preservative 09/13/2017, 08/21/2018  . Influenza Split 09/02/2014  . Influenza Whole 09/17/2013  . Influenza, Seasonal, Injecte, Preservative Fre 10/11/2015  . Influenza-Unspecified 09/13/2017  . PFIZER SARS-COV-2 Vaccination 01/24/2020, 03/06/2020  . PPD Test 10/11/2015, 08/05/2019   Last Colon -  10/12/2016 - Dr Hilarie Fredrickson - Recc 5 yr f/u due Nov 2021  Past Surgical History:  Procedure Laterality Date  . COLONOSCOPY    . FINGER SURGERY    . iron infusion    . KNEE SURGERY     right  . PACEMAKER IMPLANT N/A 08/10/2020   Procedure: PACEMAKER IMPLANT;  Surgeon: Constance Haw, MD;  Location: [facility];  Service: Cardiovascular;  Laterality: N/A;  . ROTATOR CUFF REPAIR  1996   left   Family History  Problem Relation Age of Onset  . Hyperlipidemia Mother   . COPD Mother   . Heart failure Mother   . Cancer Mother        breast  . Hyperlipidemia Brother   . Stroke Maternal Grandfather   . COPD Maternal Grandfather        colon  . Colon cancer Maternal Grandfather 68       died at 61  . Hypertension Father   . Esophageal cancer Neg Hx   . Stomach cancer Neg Hx   . Rectal cancer Neg Hx    Social History   Socioeconomic  History  . Marital status: Married    Spouse name: Not on file  . Number of children: Not on file  . Years of education: Not on file  . Highest education level: Not on file  Occupational History  . Not on file  Tobacco Use  . Smoking status: Former Smoker    Packs/day: 2.00    Types: Cigarettes    Quit date: 07/03/2010    Years since quitting: 10.1  . Smokeless tobacco: Never Used  Vaping Use  . Vaping Use:  Never used  Substance and Sexual Activity  . Alcohol use: No    Alcohol/week: 0.0 standard drinks  . Drug use: No  . Sexual activity: Not on file    ROS Constitutional: Denies fever, chills, weight loss/gain, headaches, insomnia,  night sweats or change in appetite. Does c/o fatigue. Eyes: Denies redness, blurred vision, diplopia, discharge, itchy or watery eyes.  ENT: Denies discharge, congestion, post nasal drip, epistaxis, sore throat, earache, hearing loss, dental pain, Tinnitus, Vertigo, Sinus pain or snoring.  Cardio: Denies chest pain, palpitations, irregular heartbeat, syncope, dyspnea, diaphoresis, orthopnea, PND, claudication or edema Respiratory: denies cough, dyspnea, DOE, pleurisy, hoarseness, laryngitis or wheezing.  Gastrointestinal: Denies dysphagia, heartburn, reflux, water brash, pain, cramps, nausea, vomiting, bloating, diarrhea, constipation, hematemesis, melena, hematochezia, jaundice or hemorrhoids Genitourinary: Denies dysuria, frequency, urgency, nocturia, hesitancy, discharge, hematuria or flank pain Musculoskeletal: Denies arthralgia, myalgia, stiffness, Jt. Swelling, pain, limp or strain/sprain. Denies Falls. Skin: Denies puritis, rash, hives, warts, acne, eczema or change in skin lesion Neuro: No weakness, tremor, incoordination, spasms, paresthesia or pain Psychiatric: Denies confusion, memory loss or sensory loss. Denies Depression. Endocrine: Denies change in weight, skin, hair change, nocturia, and paresthesia, diabetic polys, visual blurring or  hyper / hypo glycemic episodes.  Heme/Lymph: No excessive bleeding, bruising or enlarged lymph nodes.  Physical Exam  BP 122/72   Pulse 64   Temp 97.7 F (36.5 C)   Resp 16   Ht 5\' 10"  (1.778 m)   Wt 176 lb 9.6 oz (80.1 kg)   SpO2 99%   BMI 25.34 kg/m   General Appearance: Well nourished and well groomed and in no apparent distress.  Eyes: PERRLA, EOMs, conjunctiva no swelling or erythema, normal fundi and vessels. Sinuses: No frontal/maxillary tenderness ENT/Mouth: EACs patent / TMs  nl. Nares clear without erythema, swelling, mucoid exudates. Oral hygiene is good. No erythema, swelling, or exudate. Tongue normal, non-obstructing. Tonsils not swollen or erythematous. Hearing normal.  Neck: Supple, thyroid not palpable. No bruits, nodes or JVD. Respiratory: Respiratory effort normal.  BS equal and clear bilateral without rales, rhonci, wheezing or stridor. Cardio: Heart sounds are normal with regular rate and rhythm and no murmurs, rubs or gallops. Peripheral pulses are normal and equal bilaterally without edema. No aortic or femoral bruits. Chest: symmetric with normal excursions and percussion.  Abdomen: Soft, with Nl bowel sounds. Nontender, no guarding, rebound, hernias, masses, or organomegaly.  Lymphatics: Non tender without lymphadenopathy.  Musculoskeletal: Full ROM all peripheral extremities, joint stability, 5/5 strength, and normal gait. Skin: Warm and dry without rashes, lesions, cyanosis, clubbing or  ecchymosis.  Neuro: Cranial nerves intact, reflexes equal bilaterally. Normal muscle tone, no cerebellar symptoms. Sensation intact.  Pysch: Alert and oriented X 3 with normal affect, insight and judgment appropriate.   Assessment and Plan  1. Annual Preventative/Screening Exam    2. Essential hypertension  - EKG 12-Lead - [place], RETROPERITNL ABD,  LTD - Urinalysis, Routine w reflex microscopic - Microalbumin / creatinine urine ratio - CBC with  Differential/Platelet - COMPLETE METABOLIC PANEL WITH GFR - Magnesium - TSH  3. Hyperlipidemia, mixed  - EKG 12-Lead - [place], RETROPERITNL ABD,  LTD - Lipid panel - TSH  4. Glucose intolerance  - EKG 12-Lead - [place], RETROPERITNL ABD,  LTD - Hemoglobin A1c - Insulin, random  5. Vitamin D deficiency  - VITAMIN D 25 Hydroxy  6. 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 Waldenstrom's macroglobulinemia ([place])  8. Atherosclerosis of coronary artery without angina pectoris, unspecified vessel or  lesion type, unspecified whether native or transplanted heart  - EKG 12-Lead  9. Presence of permanent cardiac pacemaker - EKG 12-Lead  10. BPH with obstruction/lower urinary tract symptoms  - PSA  11. Prostate cancer screening  - PSA  12. Screening-pulmonary TB  - TB Skin Test  13. Screening for ischemic heart disease  - EKG 12-Lead  14. FHx: heart disease  - EKG 12-Lead - [place], RETROPERITNL ABD,  LTD  15. Former smoker  - EKG 12-Lead - [place], RETROPERITNL ABD,  LTD  16. Screening for AAA (aortic abdominal aneurysm)  - [place], RETROPERITNL ABD,  LTD  17. Fatigue, unspecified type  - Iron,Total/Total Iron Binding Cap - Vitamin B12 - Testosterone - CBC with Differential/Platelet - TSH  18. Medication management  - Urinalysis, Routine w reflex microscopic - Microalbumin / creatinine urine ratio - CBC with Differential/Platelet - COMPLETE METABOLIC PANEL WITH GFR - Magnesium - Lipid panel - TSH - Hemoglobin A1c - Insulin, random - VITAMIN D 25 Hydroxy  19. Need for immunization against influenza  - FLU VACCINE MDCK QUAD W/Preservative          Patient was counseled in prudent diet, weight control to achieve/maintain BMI less than 25, BP monitoring, regular exercise and medications as discussed.  Discussed med effects and SE's. Routine screening labs and tests as requested with regular follow-up as recommended. Over 40 minutes of exam, counseling, chart  review and high complex critical decision making was performed   Kirtland Bouchard, MD

## 2020-08-31 NOTE — Patient Instructions (Signed)
Due to recent changes in healthcare laws, you may see the results of your imaging and laboratory studies on MyChart before your provider has had a chance to review them.  We understand that in some cases there may be results that are confusing or concerning to you. Not all laboratory results come back in the same time frame and the provider may be waiting for multiple results in order to interpret others.  Please give us 48 hours in order for your provider to thoroughly review all the results before contacting the office for clarification of your results.   ++++++++++++++++++++++++++++++++++++++  Vit D  & Vit C 1,000 mg   are recommended to help protect  against the Covid-19 and other Corona viruses.    Also it's recommended  to take  Zinc 50 mg  to help  protect against the Covid-19   and best place to get  is also on [employer]  and don't pay more than 6-8 cents /pill !  =============================== Coronavirus (COVID-19) Are you at risk?  Are you at risk for the Coronavirus (COVID-19)?  To be considered HIGH RISK for Coronavirus (COVID-19), you have to meet the following criteria:  . Traveled to [place], [place], [place], [place] or [place]; or in the [place] to [place], [place], [place]  . or [place]; and have fever, cough, and shortness of breath within the last 2 weeks of travel OR . Been in close contact with a person diagnosed with COVID-19 within the last 2 weeks and have  . fever, cough,and shortness of breath .  . IF YOU DO NOT MEET THESE CRITERIA, YOU ARE CONSIDERED LOW RISK FOR COVID-19.  What to do if you are HIGH RISK for COVID-19?  . If you are having a medical emergency, call 911. . Seek medical care right away. Before you go to a doctor's office, urgent care or emergency department, .  call ahead and tell them about your recent travel, contact with someone diagnosed with COVID-19  .  and your symptoms.  . You should receive instructions from your  physician's office regarding next steps of care.  . When you arrive at healthcare provider, tell the healthcare staff immediately you have returned from  . visiting [place], [place], [place], [place] or [place]; or traveled in the [place] to [place], [place],  . [place] or [place] in the last two weeks or you have been in close contact with a person diagnosed with  . COVID-19 in the last 2 weeks.   . Tell the health care staff about your symptoms: fever, cough and shortness of breath. . After you have been seen by a medical provider, you will be either: o Tested for (COVID-19) and discharged home on quarantine except to seek medical care if  o symptoms worsen, and asked to  - Stay home and avoid contact with others until you get your results (4-5 days)  - Avoid travel on public transportation if possible (such as bus, train, or airplane) or o Sent to the Emergency Department by EMS for evaluation, COVID-19 testing  and  o possible admission depending on your condition and test results.  What to do if you are LOW RISK for COVID-19?  Reduce your risk of any infection by using the same precautions used for avoiding the common cold or flu:  . Wash your hands often with soap and warm water for at least 20 seconds.  If soap and water are not readily   available,  . use an alcohol-based hand sanitizer with at least 60% alcohol.  . If coughing or sneezing, cover your mouth and nose by coughing or sneezing into the elbow areas of your shirt or coat, .  into a tissue or into your sleeve (not your hands). . Avoid shaking hands with others and consider head nods or verbal greetings only. . Avoid touching your eyes, nose, or mouth with unwashed hands.  . Avoid close contact with people who are sick. . Avoid places or events with large numbers of people in one location, like concerts or sporting events. . Carefully consider travel plans you have or are making. . If you are planning any travel  outside or inside the [place], visit the CDC's Travelers' Health webpage for the latest health notices. . If you have some symptoms but not all symptoms, continue to monitor at home and seek medical attention  . if your symptoms worsen. . If you are having a medical emergency, call 911. >>>>>>>>>>>>>>>>>>>>>>>>>>>> Preventive Care for Adults  A healthy lifestyle and preventive care can promote health and wellness. Preventive health guidelines for men include the following key practices:  A routine yearly physical is a good way to check with your health care provider about your health and preventative screening. It is a chance to share any concerns and updates on your health and to receive a thorough exam.  Visit your dentist for a routine exam and preventative care every 6 months. Brush your teeth twice a day and floss once a day. Good oral hygiene prevents tooth decay and gum disease.  The frequency of eye exams is based on your age, health, family medical history, use of contact lenses, and other factors. Follow your health care provider's recommendations for frequency of eye exams.  Eat a healthy diet. Foods such as vegetables, fruits, whole grains, low-fat dairy products, and lean protein foods contain the nutrients you need without too many calories. Decrease your intake of foods high in solid fats, added sugars, and salt. Eat the right amount of calories for you. Get information about a proper diet from your health care provider, if necessary.  Regular physical exercise is one of the most important things you can do for your health. Most adults should get at least 150 minutes of moderate-intensity exercise (any activity that increases your heart rate and causes you to sweat) each week. In addition, most adults need muscle-strengthening exercises on 2 or more days a week.  Maintain a healthy weight. The body mass index (BMI) is a screening tool to identify possible weight problems. It provides an  estimate of body fat based on height and weight. Your health care provider can find your BMI and can help you achieve or maintain a healthy weight. For adults 20 years and older:  A BMI below 18.5 is considered underweight.  A BMI of 18.5 to 24.9 is normal.  A BMI of 25 to 29.9 is considered overweight.  A BMI of 30 and above is considered obese.  Maintain normal blood lipids and cholesterol levels by exercising and minimizing your intake of saturated fat. Eat a balanced diet with plenty of fruit and vegetables. Blood tests for lipids and cholesterol should begin at age 20 and be repeated every 5 years. If your lipid or cholesterol levels are high, you are over 50, or you are at high risk for heart disease, you may need your cholesterol levels checked more frequently. Ongoing high lipid and cholesterol levels should be treated   with medicines if diet and exercise are not working.  If you smoke, find out from your health care provider how to quit. If you do not use tobacco, do not start.  Lung cancer screening is recommended for adults aged 55-80 years who are at high risk for developing lung cancer because of a history of smoking. A yearly low-dose CT scan of the lungs is recommended for people who have at least a 30-pack-year history of smoking and are a current smoker or have quit within the past 15 years. A pack year of smoking is smoking an average of 1 pack of cigarettes a day for 1 year (for example: 1 pack a day for 30 years or 2 packs a day for 15 years). Yearly screening should continue until the smoker has stopped smoking for at least 15 years. Yearly screening should be stopped for people who develop a health problem that would prevent them from having lung cancer treatment.  If you choose to drink alcohol, do not have more than 2 drinks per day. One drink is considered to be 12 ounces (355 mL) of beer, 5 ounces (148 mL) of wine, or 1.5 ounces (44 mL) of liquor.  Avoid use of street  drugs. Do not share needles with anyone. Ask for help if you need support or instructions about stopping the use of drugs.  High blood pressure causes heart disease and increases the risk of stroke. Your blood pressure should be checked at least every 1-2 years. Ongoing high blood pressure should be treated with medicines, if weight loss and exercise are not effective.  If you are 45-79 years old, ask your health care provider if you should take aspirin to prevent heart disease.  Diabetes screening involves taking a blood sample to check your fasting blood sugar level. This should be done once every 3 years, after age 45, if you are within normal weight and without risk factors for diabetes. Testing should be considered at a younger age or be carried out more frequently if you are overweight and have at least 1 risk factor for diabetes.  Colorectal cancer can be detected and often prevented. Most routine colorectal cancer screening begins at the age of 50 and continues through age 75. However, your health care provider may recommend screening at an earlier age if you have risk factors for colon cancer. On a yearly basis, your health care provider may provide home test kits to check for hidden blood in the stool. Use of a small camera at the end of a tube to directly examine the colon (sigmoidoscopy or colonoscopy) can detect the earliest forms of colorectal cancer. Talk to your health care provider about this at age 50, when routine screening begins. Direct exam of the colon should be repeated every 5-10 years through age 75, unless early forms of precancerous polyps or small growths are found.   Talk with your health care provider about prostate cancer screening.  Testicular cancer screening isrecommended for adult males. Screening includes self-exam, a health care provider exam, and other screening tests. Consult with your health care provider about any symptoms you have or any concerns you have about  testicular cancer.  Use sunscreen. Apply sunscreen liberally and repeatedly throughout the day. You should seek shade when your shadow is shorter than you. Protect yourself by wearing long sleeves, pants, a wide-brimmed hat, and sunglasses year round, whenever you are outdoors.  Once a month, do a whole-body skin exam, using a mirror to look at   the skin on your back. Tell your health care provider about new moles, moles that have irregular borders, moles that are larger than a pencil eraser, or moles that have changed in shape or color.  Stay current with required vaccines (immunizations).  Influenza vaccine. All adults should be immunized every year.  Tetanus, diphtheria, and acellular pertussis (Td, Tdap) vaccine. An adult who has not previously received Tdap or who does not know his vaccine status should receive 1 dose of Tdap. This initial dose should be followed by tetanus and diphtheria toxoids (Td) booster doses every 10 years. Adults with an unknown or incomplete history of completing a 3-dose immunization series with Td-containing vaccines should begin or complete a primary immunization series including a Tdap dose. Adults should receive a Td booster every 10 years.  Varicella vaccine. An adult without evidence of immunity to varicella should receive 2 doses or a second dose if he has previously received 1 dose.  Human papillomavirus (HPV) vaccine. Males aged 13-21 years who have not received the vaccine previously should receive the 3-dose series. Males aged 22-26 years may be immunized. Immunization is recommended through the age of 26 years for any male who has sex with males and did not get any or all doses earlier. Immunization is recommended for any person with an immunocompromised condition through the age of 26 years if he did not get any or all doses earlier. During the 3-dose series, the second dose should be obtained 4-8 weeks after the first dose. The third dose should be obtained  24 weeks after the first dose and 16 weeks after the second dose.  Zoster vaccine. One dose is recommended for adults aged 60 years or older unless certain conditions are present.    PREVNAR  - Pneumococcal 13-valent conjugate (PCV13) vaccine. When indicated, a person who is uncertain of his immunization history and has no record of immunization should receive the PCV13 vaccine. An adult aged 19 years or older who has certain medical conditions and has not been previously immunized should receive 1 dose of PCV13 vaccine. This PCV13 should be followed with a dose of pneumococcal polysaccharide (PPSV23) vaccine. The PPSV23 vaccine dose should be obtained at least 1 r more year(s) after the dose of PCV13 vaccine. An adult aged 19 years or older who has certain medical conditions and previously received 1 or more doses of PPSV23 vaccine should receive 1 dose of PCV13. The PCV13 vaccine dose should be obtained 1 or more years after the last PPSV23 vaccine dose.    PNEUMOVAX - Pneumococcal polysaccharide (PPSV23) vaccine. When PCV13 is also indicated, PCV13 should be obtained first. All adults aged 65 years and older should be immunized. An adult younger than age 65 years who has certain medical conditions should be immunized. Any person who resides in a nursing home or long-term care facility should be immunized. An adult smoker should be immunized. People with an immunocompromised condition and certain other conditions should receive both PCV13 and PPSV23 vaccines. People with human immunodeficiency virus (HIV) infection should be immunized as soon as possible after diagnosis. Immunization during chemotherapy or radiation therapy should be avoided. Routine use of PPSV23 vaccine is not recommended for American Indians, [place] Natives, or people younger than 65 years unless there are medical conditions that require PPSV23 vaccine. When indicated, people who have unknown immunization and have no record of  immunization should receive PPSV23 vaccine. One-time revaccination 5 years after the first dose of PPSV23 is recommended for people aged   19-64 years who have chronic kidney failure, nephrotic syndrome, asplenia, or immunocompromised conditions. People who received 1-2 doses of PPSV23 before age 65 years should receive another dose of PPSV23 vaccine at age 65 years or later if at least 5 years have passed since the previous dose. Doses of PPSV23 are not needed for people immunized with PPSV23 at or after age 65 years.    Hepatitis A vaccine. Adults who wish to be protected from this disease, have certain high-risk conditions, work with hepatitis A-infected animals, work in hepatitis A research labs, or travel to or work in countries with a high rate of hepatitis A should be immunized. Adults who were previously unvaccinated and who anticipate close contact with an international adoptee during the first 60 days after arrival in the [place] from a country with a high rate of hepatitis A should be immunized.    Hepatitis B vaccine. Adults should be immunized if they wish to be protected from this disease, have certain high-risk conditions, may be exposed to blood or other infectious body fluids, are household contacts or sex partners of hepatitis B positive people, are clients or workers in certain care facilities, or travel to or work in countries with a high rate of hepatitis B.   Preventive Service / Frequency   Ages 40 to 64  Blood pressure check.  Lipid and cholesterol check  Lung cancer screening. / Every year if you are aged 55-80 years and have a 30-pack-year history of smoking and currently smoke or have quit within the past 15 years. Yearly screening is stopped once you have quit smoking for at least 15 years or develop a health problem that would prevent you from having lung cancer treatment.  Fecal occult blood test (FOBT) of stool. / Every year beginning at age 50 and continuing  until age 75. You may not have to do this test if you get a colonoscopy every 10 years.  Flexible sigmoidoscopy** or colonoscopy.** / Every 5 years for a flexible sigmoidoscopy or every 10 years for a colonoscopy beginning at age 50 and continuing until age 75. Screening for abdominal aortic aneurysm (AAA)  by ultrasound is recommended for people who have history of high blood pressure or who are current or former smokers. +++++++++++ Recommend Adult Low Dose Aspirin or  coated  Aspirin 81 mg daily  To reduce risk of Colon Cancer 40 %,  Skin Cancer 26 % ,  Malignant Melanoma 46%  and  Pancreatic cancer 60% ++++++++++++++++++++ Vitamin D goal  is between 70-100.  Please make sure that you are taking your Vitamin D as directed.  It is very important as a natural anti-inflammatory  helping hair, skin, and nails, as well as reducing stroke and heart attack risk.  It helps your bones and helps with mood. It also decreases numerous cancer risks so please take it as directed.  Low Vit D is associated with a 200-300% higher risk for CANCER  and 200-300% higher risk for HEART   ATTACK  &  STROKE.   ...................................... It is also associated with higher death rate at younger ages,  autoimmune diseases like Rheumatoid arthritis, Lupus, Multiple Sclerosis.    Also many other serious conditions, like depression, Alzheimer's Dementia, infertility, muscle aches, fatigue, fibromyalgia - just to name a few. +++++++++++++++++++++ Recommend the book "The END of DIETING" by Dr Joel Fuhrman  & the book "The END of DIABETES " by Dr Joel Fuhrman At [employer].com - get book & Audio CD's      Being diabetic has a  300% increased risk for heart attack, stroke, cancer, and alzheimer- type vascular dementia. It is very important that you work harder with diet by avoiding all foods that are white. Avoid white rice ([place] & wild rice is OK), white potatoes (sweetpotatoes in moderation is OK), White  bread or wheat bread or anything made out of white flour like bagels, donuts, rolls, buns, biscuits, cakes, pastries, cookies, pizza crust, and pasta (made from white flour & egg whites) - vegetarian pasta or spinach or wheat pasta is OK. Multigrain breads like Arnold's or Pepperidge Farm, or multigrain sandwich thins or flatbreads.  Diet, exercise and weight loss can reverse and cure diabetes in the early stages.  Diet, exercise and weight loss is very important in the control and prevention of complications of diabetes which affects every system in your body, ie. Brain - dementia/stroke, eyes - glaucoma/blindness, heart - heart attack/heart failure, kidneys - dialysis, stomach - gastric paralysis, intestines - malabsorption, nerves - severe painful neuritis, circulation - gangrene & loss of a leg(s), and finally cancer and Alzheimers.    I recommend avoid fried & greasy foods,  sweets/candy, white rice ([place] or wild rice or Quinoa is OK), white potatoes (sweet potatoes are OK) - anything made from white flour - bagels, doughnuts, rolls, buns, biscuits,white and wheat breads, pizza crust and traditional pasta made of white flour & egg white(vegetarian pasta or spinach or wheat pasta is OK).  Multi-grain bread is OK - like multi-grain flat bread or sandwich thins. Avoid alcohol in excess. Exercise is also important.    Eat all the vegetables you want - avoid meat, especially red meat and dairy - especially cheese.  Cheese is the most concentrated form of trans-fats which is the worst thing to clog up our arteries. Veggie cheese is OK which can be found in the fresh produce section at [employer] or [employer] or [employer]  ++++++++++++++++++++++ DASH Eating Plan  DASH stands for "Dietary Approaches to Stop Hypertension."   The DASH eating plan is a healthy eating plan that has been shown to reduce high blood pressure (hypertension). Additional health benefits may include reducing the risk of type 2  diabetes mellitus, heart disease, and stroke. The DASH eating plan may also help with weight loss. WHAT DO I NEED TO KNOW ABOUT THE DASH EATING PLAN? For the DASH eating plan, you will follow these general guidelines:  Choose foods with a percent daily value for sodium of less than 5% (as listed on the food label).  Use salt-free seasonings or herbs instead of table salt or sea salt.  Check with your health care provider or pharmacist before using salt substitutes.  Eat lower-sodium products, often labeled as "lower sodium" or "no salt added."  Eat fresh foods.  Eat more vegetables, fruits, and low-fat dairy products.  Choose whole grains. Look for the word "whole" as the first word in the ingredient list.  Choose fish   Limit sweets, desserts, sugars, and sugary drinks.  Choose heart-healthy fats.  Eat veggie cheese   Eat more home-cooked food and less restaurant, buffet, and fast food.  Limit fried foods.  Cook foods using methods other than frying.  Limit canned vegetables. If you do use them, rinse them well to decrease the sodium.  When eating at a restaurant, ask that your food be prepared with less salt, or no salt if possible.                        WHAT FOODS CAN I EAT? Read Dr Joel Fuhrman's books on The End of Dieting & The End of Diabetes  Grains Whole grain or whole wheat bread. Seeney rice. Whole grain or whole wheat pasta. Quinoa, bulgur, and whole grain cereals. Low-sodium cereals. Corn or whole wheat flour tortillas. Whole grain cornbread. Whole grain crackers. Low-sodium crackers.  Vegetables Fresh or frozen vegetables (raw, steamed, roasted, or grilled). Low-sodium or reduced-sodium tomato and vegetable juices. Low-sodium or reduced-sodium tomato sauce and paste. Low-sodium or reduced-sodium canned vegetables.   Fruits All fresh, canned (in natural juice), or frozen fruits.  Protein Products  All fish and seafood.  Dried beans, peas, or lentils.  Unsalted nuts and seeds. Unsalted canned beans.  Dairy Low-fat dairy products, such as skim or 1% milk, 2% or reduced-fat cheeses, low-fat ricotta or cottage cheese, or plain low-fat yogurt. Low-sodium or reduced-sodium cheeses.  Fats and Oils Tub margarines without trans fats. Light or reduced-fat mayonnaise and salad dressings (reduced sodium). Avocado. Safflower, olive, or canola oils. Natural peanut or almond butter.  Other Unsalted popcorn and pretzels. The items listed above may not be a complete list of recommended foods or beverages. Contact your dietitian for more options.  +++++++++++++++++++  WHAT FOODS ARE NOT RECOMMENDED? Grains/ White flour or wheat flour White bread. White pasta. White rice. Refined cornbread. Bagels and croissants. Crackers that contain trans fat.  Vegetables  Creamed or fried vegetables. Vegetables in a . Regular canned vegetables. Regular canned tomato sauce and paste. Regular tomato and vegetable juices.  Fruits Dried fruits. Canned fruit in light or heavy syrup. Fruit juice.  Meat and Other Protein Products Meat in general - RED meat & White meat.  Fatty cuts of meat. Ribs, chicken wings, all processed meats as bacon, sausage, bologna, salami, fatback, hot dogs, bratwurst and packaged luncheon meats.  Dairy Whole or 2% milk, cream, half-and-half, and cream cheese. Whole-fat or sweetened yogurt. Full-fat cheeses or blue cheese. Non-dairy creamers and whipped toppings. Processed cheese, cheese spreads, or cheese curds.  Condiments Onion and garlic salt, seasoned salt, table salt, and sea salt. Canned and packaged gravies. Worcestershire sauce. Tartar sauce. Barbecue sauce. Teriyaki sauce. Soy sauce, including reduced sodium. Steak sauce. Fish sauce. Oyster sauce. Cocktail sauce. Horseradish. Ketchup and mustard. Meat flavorings and tenderizers. Bouillon cubes. Hot sauce. Tabasco sauce. Marinades. Taco seasonings. Relishes.  Fats and Oils Butter,  stick margarine, lard, shortening and bacon fat. Coconut, palm kernel, or palm oils. Regular salad dressings.  Pickles and olives. Salted popcorn and pretzels.  The items listed above may not be a complete list of foods and beverages to avoid.    

## 2020-09-01 ENCOUNTER — Other Ambulatory Visit: Payer: Self-pay

## 2020-09-01 ENCOUNTER — Ambulatory Visit (INDEPENDENT_AMBULATORY_CARE_PROVIDER_SITE_OTHER): Payer: 59 | Admitting: Internal Medicine

## 2020-09-01 VITALS — BP 122/72 | HR 64 | Temp 97.7°F | Resp 16 | Ht 70.0 in | Wt 176.6 lb

## 2020-09-01 DIAGNOSIS — Z23 Encounter for immunization: Secondary | ICD-10-CM | POA: Diagnosis not present

## 2020-09-01 DIAGNOSIS — R7309 Other abnormal glucose: Secondary | ICD-10-CM | POA: Diagnosis not present

## 2020-09-01 DIAGNOSIS — Z0000 Encounter for general adult medical examination without abnormal findings: Secondary | ICD-10-CM | POA: Diagnosis not present

## 2020-09-01 DIAGNOSIS — N138 Other obstructive and reflux uropathy: Secondary | ICD-10-CM

## 2020-09-01 DIAGNOSIS — I2510 Atherosclerotic heart disease of native coronary artery without angina pectoris: Secondary | ICD-10-CM

## 2020-09-01 DIAGNOSIS — R350 Frequency of micturition: Secondary | ICD-10-CM

## 2020-09-01 DIAGNOSIS — Z131 Encounter for screening for diabetes mellitus: Secondary | ICD-10-CM | POA: Diagnosis not present

## 2020-09-01 DIAGNOSIS — Z1329 Encounter for screening for other suspected endocrine disorder: Secondary | ICD-10-CM | POA: Diagnosis not present

## 2020-09-01 DIAGNOSIS — E7439 Other disorders of intestinal carbohydrate absorption: Secondary | ICD-10-CM

## 2020-09-01 DIAGNOSIS — M0600 Rheumatoid arthritis without rheumatoid factor, unspecified site: Secondary | ICD-10-CM

## 2020-09-01 DIAGNOSIS — N401 Enlarged prostate with lower urinary tract symptoms: Secondary | ICD-10-CM | POA: Diagnosis not present

## 2020-09-01 DIAGNOSIS — Z1389 Encounter for screening for other disorder: Secondary | ICD-10-CM | POA: Diagnosis not present

## 2020-09-01 DIAGNOSIS — Z13220 Encounter for screening for lipoid disorders: Secondary | ICD-10-CM

## 2020-09-01 DIAGNOSIS — Z111 Encounter for screening for respiratory tuberculosis: Secondary | ICD-10-CM

## 2020-09-01 DIAGNOSIS — E559 Vitamin D deficiency, unspecified: Secondary | ICD-10-CM

## 2020-09-01 DIAGNOSIS — R5383 Other fatigue: Secondary | ICD-10-CM | POA: Diagnosis not present

## 2020-09-01 DIAGNOSIS — Z8249 Family history of ischemic heart disease and other diseases of the circulatory system: Secondary | ICD-10-CM

## 2020-09-01 DIAGNOSIS — C880 Waldenstrom macroglobulinemia: Secondary | ICD-10-CM

## 2020-09-01 DIAGNOSIS — Z79899 Other long term (current) drug therapy: Secondary | ICD-10-CM | POA: Diagnosis not present

## 2020-09-01 DIAGNOSIS — Z0001 Encounter for general adult medical examination with abnormal findings: Secondary | ICD-10-CM

## 2020-09-01 DIAGNOSIS — Z130 Encounter for screening for diseases of the blood and blood-forming organs and certain disorders involving the immune mechanism: Secondary | ICD-10-CM | POA: Diagnosis not present

## 2020-09-01 DIAGNOSIS — Z125 Encounter for screening for malignant neoplasm of prostate: Secondary | ICD-10-CM | POA: Diagnosis not present

## 2020-09-01 DIAGNOSIS — Z136 Encounter for screening for cardiovascular disorders: Secondary | ICD-10-CM

## 2020-09-01 DIAGNOSIS — E782 Mixed hyperlipidemia: Secondary | ICD-10-CM

## 2020-09-01 DIAGNOSIS — Z950 Presence of cardiac pacemaker: Secondary | ICD-10-CM

## 2020-09-01 DIAGNOSIS — Z87891 Personal history of nicotine dependence: Secondary | ICD-10-CM

## 2020-09-01 DIAGNOSIS — I10 Essential (primary) hypertension: Secondary | ICD-10-CM

## 2020-09-02 LAB — CBC WITH DIFFERENTIAL/PLATELET
Absolute Monocytes: 820 cells/uL (ref 200–950)
Basophils Absolute: 50 cells/uL (ref 0–200)
Basophils Relative: 0.9 %
Eosinophils Absolute: 83 cells/uL (ref 15–500)
Eosinophils Relative: 1.5 %
HCT: 42.4 % (ref 38.5–50.0)
Hemoglobin: 14.4 g/dL (ref 13.2–17.1)
Lymphs Abs: 963 cells/uL (ref 850–3900)
MCH: 32.7 pg (ref 27.0–33.0)
MCHC: 34 g/dL (ref 32.0–36.0)
MCV: 96.1 fL (ref 80.0–100.0)
MPV: 11 fL (ref 7.5–12.5)
Monocytes Relative: 14.9 %
Neutro Abs: 3586 cells/uL (ref 1500–7800)
Neutrophils Relative %: 65.2 %
Platelets: 287 10*3/uL (ref 140–400)
RBC: 4.41 10*6/uL (ref 4.20–5.80)
RDW: 14.5 % (ref 11.0–15.0)
Total Lymphocyte: 17.5 %
WBC: 5.5 10*3/uL (ref 3.8–10.8)

## 2020-09-02 LAB — URINALYSIS, ROUTINE W REFLEX MICROSCOPIC
Bacteria, UA: NONE SEEN /HPF
Bilirubin Urine: NEGATIVE
Glucose, UA: NEGATIVE
Hyaline Cast: NONE SEEN /LPF
Ketones, ur: NEGATIVE
Leukocytes,Ua: NEGATIVE
Nitrite: NEGATIVE
Protein, ur: NEGATIVE
RBC / HPF: NONE SEEN /HPF (ref 0–2)
Specific Gravity, Urine: 1.005 (ref 1.001–1.03)
Squamous Epithelial / HPF: NONE SEEN /HPF (ref <=5)
WBC, UA: NONE SEEN /HPF (ref 0–5)
pH: 6 (ref 5.0–8.0)

## 2020-09-02 LAB — VITAMIN D 25 HYDROXY (VIT D DEFICIENCY, FRACTURES): Vit D, 25-Hydroxy: 103 ng/mL — ABNORMAL HIGH (ref 30–100)

## 2020-09-02 LAB — IRON, TOTAL/TOTAL IRON BINDING CAP
%SAT: 25 % (calc) (ref 20–48)
Iron: 94 ug/dL (ref 50–180)
TIBC: 379 mcg/dL (calc) (ref 250–425)

## 2020-09-02 LAB — LIPID PANEL
Cholesterol: 212 mg/dL — ABNORMAL HIGH (ref <200)
HDL: 46 mg/dL (ref >=40)
LDL Cholesterol (Calc): 149 mg/dL (calc) — ABNORMAL HIGH
Non-HDL Cholesterol (Calc): 166 mg/dL (calc) — ABNORMAL HIGH (ref <130)
Total CHOL/HDL Ratio: 4.6 (calc) (ref <5.0)
Triglycerides: 73 mg/dL (ref <150)

## 2020-09-02 LAB — COMPLETE METABOLIC PANEL WITH GFR
AG Ratio: 2.1 (calc) (ref 1.0–2.5)
ALT: 15 U/L (ref 9–46)
AST: 20 U/L (ref 10–35)
Albumin: 4.9 g/dL (ref 3.6–5.1)
Alkaline phosphatase (APISO): 104 U/L (ref 35–144)
BUN/Creatinine Ratio: 10 (calc) (ref 6–22)
BUN: 13 mg/dL (ref 7–25)
CO2: 28 mmol/L (ref 20–32)
Calcium: 10 mg/dL (ref 8.6–10.3)
Chloride: 102 mmol/L (ref 98–110)
Creat: 1.31 mg/dL — ABNORMAL HIGH (ref 0.70–1.25)
GFR, Est African American: 68 mL/min/{1.73_m2} (ref >=60)
GFR, Est Non African American: 58 mL/min/{1.73_m2} — ABNORMAL LOW (ref >=60)
Globulin: 2.3 g/dL (calc) (ref 1.9–3.7)
Glucose, Bld: 79 mg/dL (ref 65–99)
Potassium: 3.9 mmol/L (ref 3.5–5.3)
Sodium: 139 mmol/L (ref 135–146)
Total Bilirubin: 0.7 mg/dL (ref 0.2–1.2)
Total Protein: 7.2 g/dL (ref 6.1–8.1)

## 2020-09-02 LAB — MICROALBUMIN / CREATININE URINE RATIO
Creatinine, Urine: 50 mg/dL (ref 20–320)
Microalb Creat Ratio: 6 mcg/mg creat (ref <30)
Microalb, Ur: 0.3 mg/dL

## 2020-09-02 LAB — VITAMIN B12: Vitamin B-12: 1290 pg/mL — ABNORMAL HIGH (ref 200–1100)

## 2020-09-02 LAB — MAGNESIUM: Magnesium: 2.4 mg/dL (ref 1.5–2.5)

## 2020-09-02 LAB — HEMOGLOBIN A1C
Hgb A1c MFr Bld: 5.5 % of total Hgb (ref <5.7)
Mean Plasma Glucose: 111 (calc)
eAG (mmol/L): 6.2 (calc)

## 2020-09-02 LAB — TESTOSTERONE: Testosterone: 606 ng/dL (ref 250–827)

## 2020-09-02 LAB — TSH: TSH: 2.88 mIU/L (ref 0.40–4.50)

## 2020-09-02 LAB — INSULIN, RANDOM: Insulin: <1 u[IU]/mL

## 2020-09-02 LAB — PSA: PSA: 0.8 ng/mL (ref <=4.0)

## 2020-09-03 ENCOUNTER — Encounter: Payer: Self-pay | Admitting: Internal Medicine

## 2020-09-07 ENCOUNTER — Telehealth: Payer: Self-pay

## 2020-09-07 NOTE — Telephone Encounter (Signed)
Pt reports pain in left chest region x 1 day, pain occurs with activity of left arm or deep breath, described as a dull ache 4/10.  Pt does admit to limiting use of left arm sicne procedure.  Advised not to limit movement of arm, restrict weight lifting of greater than 10 lbs until beginning of November.  Pt sent manual transmission which shows presenting Rhythm of APVS 60s, no AMS or HVR episodes.  Normal device function, impedences/ thresholds consistent with previous measurements.  Advised tylenol for muscular pain, apply heat as needed just not over PM implant site.  Continue to monitor and if worsens or does not improve in a day or 2 call back.

## 2020-09-07 NOTE — Telephone Encounter (Signed)
Patient called in and states that his pacemaker site hurts when he takes a deep breath and when he moves a certain way. It has been going on a couple days and he is just concerned and wants to make sure everything is ok with his heart. Patient states he has never felt this before

## 2020-09-13 ENCOUNTER — Other Ambulatory Visit: Payer: Self-pay | Admitting: Adult Health

## 2020-09-26 ENCOUNTER — Telehealth: Payer: Self-pay

## 2020-09-26 MED FILL — IMBRUVICA 280 MG TAB: 280 | 28 days supply | Qty: 28 | Fill #2

## 2020-09-26 NOTE — Telephone Encounter (Signed)
The pt states he haven't felt good in the past couple of days and wanted his ppm to be checked. I asked him to send a manual transmission.  The pt been feeling fatigue.

## 2020-09-26 NOTE — Telephone Encounter (Signed)
Patient called states he has felt fatigued since returning home from the beach on 09/18/20. Patient reports he has had all his Covid vaccines including booster. No other symptoms noted. Patient states he restarted a cholesterol medication after coming back from the beach (unsure of name).   Patient advised he needs to follow-up with PCP. Manual transmission received 09/26/20. Normal device function. No episodes noted. Patient agreeable and verbalized understanding.

## 2020-09-28 ENCOUNTER — Other Ambulatory Visit: Payer: Self-pay

## 2020-09-28 ENCOUNTER — Encounter: Payer: Self-pay | Admitting: Hematology & Oncology

## 2020-09-28 ENCOUNTER — Inpatient Hospital Stay (HOSPITAL_BASED_OUTPATIENT_CLINIC_OR_DEPARTMENT_OTHER): Payer: Medicare Other | Admitting: Hematology & Oncology

## 2020-09-28 ENCOUNTER — Inpatient Hospital Stay: Payer: Medicare Other | Attending: Hematology & Oncology

## 2020-09-28 ENCOUNTER — Telehealth: Payer: Self-pay | Admitting: Hematology & Oncology

## 2020-09-28 VITALS — BP 184/83 | HR 61 | Temp 97.8°F | Resp 20 | Wt 181.4 lb

## 2020-09-28 DIAGNOSIS — D509 Iron deficiency anemia, unspecified: Secondary | ICD-10-CM | POA: Diagnosis not present

## 2020-09-28 DIAGNOSIS — I2510 Atherosclerotic heart disease of native coronary artery without angina pectoris: Secondary | ICD-10-CM

## 2020-09-28 DIAGNOSIS — C8800 Waldenstrom macroglobulinemia not having achieved remission: Secondary | ICD-10-CM

## 2020-09-28 DIAGNOSIS — D500 Iron deficiency anemia secondary to blood loss (chronic): Secondary | ICD-10-CM | POA: Diagnosis not present

## 2020-09-28 LAB — CBC WITH DIFFERENTIAL (CANCER CENTER ONLY)
Abs Immature Granulocytes: 0.01 10*3/uL (ref 0.00–0.07)
Basophils Absolute: 0.1 10*3/uL (ref 0.0–0.1)
Basophils Relative: 1 %
Eosinophils Absolute: 0.1 10*3/uL (ref 0.0–0.5)
Eosinophils Relative: 3 %
HCT: 37.5 % — ABNORMAL LOW (ref 39.0–52.0)
Hemoglobin: 12.5 g/dL — ABNORMAL LOW (ref 13.0–17.0)
Immature Granulocytes: 0 %
Lymphocytes Relative: 21 %
Lymphs Abs: 0.9 10*3/uL (ref 0.7–4.0)
MCH: 32.3 pg (ref 26.0–34.0)
MCHC: 33.3 g/dL (ref 30.0–36.0)
MCV: 96.9 fL (ref 80.0–100.0)
Monocytes Absolute: 0.7 10*3/uL (ref 0.1–1.0)
Monocytes Relative: 15 %
Neutro Abs: 2.5 10*3/uL (ref 1.7–7.7)
Neutrophils Relative %: 60 %
Platelet Count: 211 10*3/uL (ref 150–400)
RBC: 3.87 MIL/uL — ABNORMAL LOW (ref 4.22–5.81)
RDW: 13.8 % (ref 11.5–15.5)
WBC Count: 4.3 10*3/uL (ref 4.0–10.5)
nRBC: 0 % (ref 0.0–0.2)

## 2020-09-28 LAB — CMP (CANCER CENTER ONLY)
ALT: 22 U/L (ref 0–44)
AST: 29 U/L (ref 15–41)
Albumin: 4.3 g/dL (ref 3.5–5.0)
Alkaline Phosphatase: 79 U/L (ref 38–126)
Anion gap: 8 (ref 5–15)
BUN: 15 mg/dL (ref 8–23)
CO2: 29 mmol/L (ref 22–32)
Calcium: 9.6 mg/dL (ref 8.9–10.3)
Chloride: 103 mmol/L (ref 98–111)
Creatinine: 1.22 mg/dL (ref 0.61–1.24)
GFR, Estimated: >60 mL/min (ref >60)
Glucose, Bld: 73 mg/dL (ref 70–99)
Potassium: 3.9 mmol/L (ref 3.5–5.1)
Sodium: 140 mmol/L (ref 135–145)
Total Bilirubin: 0.7 mg/dL (ref 0.3–1.2)
Total Protein: 6.6 g/dL (ref 6.5–8.1)

## 2020-09-28 NOTE — Telephone Encounter (Signed)
Appointments scheduled patient will get updates from My Chart per 11/3 los

## 2020-09-28 NOTE — Progress Notes (Signed)
Hematology and Oncology Follow Up Visit  Edwin Webster [id number] Aug 26, 1961 59 y.o. 09/28/2020   Principle Diagnosis:  Waldenstrom's macroglobulinemia Iron deficiency anemia  Current Therapy:   Rituxan/bendamustine-s/p cycle 2 -- d/c on 06/11/2018 Imbruvica  280 mg po q day -- changed on 08/06/2019 IV iron as indicated - last received in April 2019   Interim History:  Edwin Webster is here today for follow-up.  The big news is that he now has a pacemaker in.  We last saw him in July, he had some issues with bradycardia.  This appeared to be worse.  He had a pacemaker put in on September 15.  After the pacemaker was placed, he really feels good.  He has more energy.  He just feels a whole lot better.  I am glad that the pacemaker was placed.  A lot of his fatigue probably was from his bradycardia.  He is doing well with the Imbruvica.  I would not think that the Imbruvica would be a factor with the bradycardia.  His last M spike was 0.4 g/dL.  The IgM level was 445 mg/dL.  There is been no problems with bleeding.  He has had no change in bowel or bladder habits.  He has had no fever.  He has had no nausea or vomiting.  There is been no leg swelling.  Overall, his performance status is ECOG 1.    Medications:  Allergies as of 09/28/2020      Reactions   Adalimumab Other (See Comments)   "blacked out", confused      Medication List       Accurate as of September 28, 2020  9:16 AM. If you have any questions, ask your nurse or doctor.        aspirin EC 81 MG tablet Take 81 mg daily by mouth.   busPIRone 5 MG tablet Commonly known as: BUSPAR Take 1/2 to 1 tablet 3 x /day for Anxiety What changed:   how much to take  how to take this  when to take this  reasons to take this  additional instructions   escitalopram 20 MG tablet Commonly known as: LEXAPRO Take    1 tablet     Daily    for Mood   ezetimibe 10 MG tablet Commonly known as: ZETIA Take 1 tablet (10 mg  total) by mouth daily.   famciclovir 500 MG tablet Commonly known as: FAMVIR TAKE 1 TABLET BY MOUTH EVERY DAY   famotidine 40 MG tablet Commonly known as: PEPCID Take 1 tabet Daily for Heartburn and Indigestion What changed:   how much to take  how to take this  when to take this  reasons to take this  additional instructions   folic acid 1 MG tablet Commonly known as: FOLVITE Take      1 tablet       Daily   ibuprofen 800 MG tablet Commonly known as: Motrin IB Take 1 tablet (800 mg total) by mouth daily as needed. What changed: reasons to take this   Imbruvica 280 MG Tabs Generic drug: Ibrutinib TAKE 1 TABLET (280 MG) BY MOUTH DAILY. What changed: See the new instructions.   levocetirizine 5 MG tablet Commonly known as: XYZAL Take 1 tablet Daily for Allergies What changed:   how much to take  how to take this  when to take this  additional instructions   olmesartan 40 MG tablet Commonly known as: BENICAR Take 1 tablet at Bedtime  for  BP What changed:   how much to take  how to take this  when to take this  additional instructions   rosuvastatin 40 MG tablet Commonly known as: CRESTOR Take 1 tablet Daily for Cholesterol What changed:   how much to take  how to take this  when to take this  additional instructions   vitamin B-12 500 MCG tablet Commonly known as: CYANOCOBALAMIN Take 500 mcg by mouth daily.   Vitamin D-3 125 MCG (5000 UT) Tabs Take 5,000 Units by mouth 2 (two) times daily.       Allergies:  Allergies  Allergen Reactions  . Adalimumab Other (See Comments)    "blacked out", confused     Past Medical History, Surgical history, Social history, and Family History were reviewed and updated.  Review of Systems: Review of Systems  Constitutional: Negative.   HENT: Negative.   Eyes: Negative.   Respiratory: Negative.   Cardiovascular: Negative.   Gastrointestinal: Negative.   Genitourinary: Negative.    Musculoskeletal: Negative.   Skin: Negative.   Neurological: Negative.   Endo/Heme/Allergies: Negative.   Psychiatric/Behavioral: Negative.       Physical Exam:  weight is 181 lb 6.4 oz (82.3 kg). His oral temperature is 97.8 F (36.6 C). His blood pressure is 184/83 (abnormal) and his pulse is 61. His respiration is 20 and oxygen saturation is 99%.   Wt Readings from Last 3 Encounters:  09/28/20 181 lb 6.4 oz (82.3 kg)  09/01/20 176 lb 9.6 oz (80.1 kg)  08/24/20 177 lb (80.3 kg)  Well then  Physical Exam Vitals reviewed.  HENT:     Head: Normocephalic and atraumatic.  Eyes:     Pupils: Pupils are equal, round, and reactive to light.  Cardiovascular:     Rate and Rhythm: Normal rate and regular rhythm.     Heart sounds: Normal heart sounds.  Pulmonary:     Effort: Pulmonary effort is normal.     Breath sounds: Normal breath sounds.  Abdominal:     General: Bowel sounds are normal.     Palpations: Abdomen is soft.  Musculoskeletal:        General: No tenderness or deformity. Normal range of motion.     Cervical back: Normal range of motion.  Lymphadenopathy:     Cervical: No cervical adenopathy.  Skin:    General: Skin is warm and dry.     Findings: No erythema or rash.  Neurological:     Mental Status: He is alert and oriented to person, place, and time.  Psychiatric:        Behavior: Behavior normal.        Thought Content: Thought content normal.        Judgment: Judgment normal.      Lab Results  Component Value Date   WBC 4.3 09/28/2020   HGB 12.5 (L) 09/28/2020   HCT 37.5 (L) 09/28/2020   MCV 96.9 09/28/2020   PLT 211 09/28/2020   Lab Results  Component Value Date   FERRITIN 58 06/22/2020   IRON 94 09/01/2020   TIBC 379 09/01/2020   UIBC 251 06/22/2020   IRONPCTSAT 25 09/01/2020   Lab Results  Component Value Date   RETICCTPCT 0.9 02/12/2018   RBC 3.87 (L) 09/28/2020   RETICCTABS 33,120 02/12/2018   Lab Results  Component Value Date    KPAFRELGTCHN 28.7 (H) 06/22/2020   LAMBDASER 12.9 06/22/2020   KAPLAMBRATIO 2.22 (H) 06/22/2020   Lab Results  Component Value Date  IGGSERUM 405 (L) 06/22/2020   IGGSERUM 461 (L) 06/22/2020   IGA 38 (L) 06/22/2020   IGA 39 (L) 06/22/2020   IGMSERUM 436 (H) 06/22/2020   IGMSERUM 445 (H) 06/22/2020   Lab Results  Component Value Date   TOTALPROTELP 6.1 06/22/2020   ALBUMINELP 3.5 06/22/2020   A1GS 0.2 06/22/2020   A2GS 0.8 06/22/2020   BETS 0.9 06/22/2020   BETA2SER 0.2 02/18/2018   GAMS 0.7 06/22/2020   MSPIKE 0.4 (H) 06/22/2020   SPEI Comment 07/09/2018     Chemistry      Component Value Date/Time   NA 140 09/28/2020 0800   NA 141 08/04/2020 1203   K 3.9 09/28/2020 0800   CL 103 09/28/2020 0800   CO2 29 09/28/2020 0800   BUN 15 09/28/2020 0800   BUN 13 08/04/2020 1203   CREATININE 1.22 09/28/2020 0800   CREATININE 1.31 (H) 09/01/2020 1549      Component Value Date/Time   CALCIUM 9.6 09/28/2020 0800   ALKPHOS 79 09/28/2020 0800   AST 29 09/28/2020 0800   ALT 22 09/28/2020 0800   BILITOT 0.7 09/28/2020 0800      Impression and Plan: Mr. Franek is a very pleasant 59 yo caucasian gentleman with Waldenstrom's macroglobulinemia.   Right now, I would not make any changes with the Imbruvica dose.  We will see what the monoclonal studies show.  I would like to hope that his M spike will be stable.  We will plan to get him back in about 4 months.  I think this would be reasonable.  Again, the pacemaker clearly has improved his quality of life.     [name], MD 11/3/20219:16 AM

## 2020-09-29 LAB — KAPPA/LAMBDA LIGHT CHAINS
Kappa free light chain: 36.8 mg/L — ABNORMAL HIGH (ref 3.3–19.4)
Kappa, lambda light chain ratio: 2.65 — ABNORMAL HIGH (ref 0.26–1.65)
Lambda free light chains: 13.9 mg/L (ref 5.7–26.3)

## 2020-09-29 LAB — IGG, IGA, IGM
IgA: 44 mg/dL — ABNORMAL LOW (ref 61–437)
IgG (Immunoglobin G), Serum: 459 mg/dL — ABNORMAL LOW (ref 603–1613)
IgM (Immunoglobulin M), Srm: 461 mg/dL — ABNORMAL HIGH (ref 20–172)

## 2020-09-30 LAB — IMMUNOFIXATION REFLEX, SERUM
IgA: 44 mg/dL — ABNORMAL LOW (ref 61–437)
IgG (Immunoglobin G), Serum: 458 mg/dL — ABNORMAL LOW (ref 603–1613)
IgM (Immunoglobulin M), Srm: 445 mg/dL — ABNORMAL HIGH (ref 20–172)

## 2020-09-30 LAB — PROTEIN ELECTROPHORESIS, SERUM, WITH REFLEX
A/G Ratio: 1.5 (ref 0.7–1.7)
Albumin ELP: 3.8 g/dL (ref 2.9–4.4)
Alpha-1-Globulin: 0.3 g/dL (ref 0.0–0.4)
Alpha-2-Globulin: 0.7 g/dL (ref 0.4–1.0)
Beta Globulin: 0.8 g/dL (ref 0.7–1.3)
Gamma Globulin: 0.7 g/dL (ref 0.4–1.8)
Globulin, Total: 2.5 g/dL (ref 2.2–3.9)
M-Spike, %: 0.4 g/dL — ABNORMAL HIGH
SPEP Interpretation: 0
Total Protein ELP: 6.3 g/dL (ref 6.0–8.5)

## 2020-10-05 ENCOUNTER — Other Ambulatory Visit: Payer: Self-pay | Admitting: Internal Medicine

## 2020-10-05 DIAGNOSIS — I10 Essential (primary) hypertension: Secondary | ICD-10-CM

## 2020-10-06 ENCOUNTER — Other Ambulatory Visit: Payer: Self-pay

## 2020-10-06 DIAGNOSIS — E782 Mixed hyperlipidemia: Secondary | ICD-10-CM

## 2020-10-06 LAB — HEPATIC FUNCTION PANEL
ALT: 40 IU/L (ref 0–44)
AST: 37 IU/L (ref 0–40)
Albumin: 4.6 g/dL (ref 3.8–4.8)
Alkaline Phosphatase: 97 IU/L (ref 44–121)
Bilirubin Total: 0.5 mg/dL (ref 0.0–1.2)
Bilirubin, Direct: 0.2 mg/dL (ref 0.00–0.40)
Total Protein: 6.4 g/dL (ref 6.0–8.5)

## 2020-10-06 LAB — LIPID PANEL
Chol/HDL Ratio: 2.9 ratio (ref 0.0–5.0)
Cholesterol, Total: 118 mg/dL (ref 100–199)
HDL: 41 mg/dL (ref >39)
LDL Chol Calc (NIH): 62 mg/dL (ref 0–99)
Triglycerides: 71 mg/dL (ref 0–149)
VLDL Cholesterol Cal: 15 mg/dL (ref 5–40)

## 2020-10-18 ENCOUNTER — Ambulatory Visit: Payer: 59 | Admitting: Cardiovascular Disease

## 2020-10-18 ENCOUNTER — Encounter: Payer: Self-pay | Admitting: Cardiovascular Disease

## 2020-10-18 ENCOUNTER — Other Ambulatory Visit: Payer: Self-pay

## 2020-10-18 ENCOUNTER — Ambulatory Visit (INDEPENDENT_AMBULATORY_CARE_PROVIDER_SITE_OTHER): Payer: Medicare Other | Admitting: Cardiovascular Disease

## 2020-10-18 DIAGNOSIS — R001 Bradycardia, unspecified: Secondary | ICD-10-CM

## 2020-10-18 DIAGNOSIS — I2510 Atherosclerotic heart disease of native coronary artery without angina pectoris: Secondary | ICD-10-CM

## 2020-10-18 DIAGNOSIS — E782 Mixed hyperlipidemia: Secondary | ICD-10-CM

## 2020-10-18 DIAGNOSIS — R0789 Other chest pain: Secondary | ICD-10-CM

## 2020-10-18 DIAGNOSIS — I10 Essential (primary) hypertension: Secondary | ICD-10-CM | POA: Diagnosis not present

## 2020-10-18 NOTE — Assessment & Plan Note (Signed)
History of atypical chest pain with coronary calcium score 665 measured 08/03/2020 with distal circumflex disease.  I suspect his chest pain is noncardiac.

## 2020-10-18 NOTE — Progress Notes (Signed)
10/18/2020 Edwin Webster   May 05, 1961  [id number]  Primary Physician Edwin Pinto, MD Primary Cardiologist: Edwin Harp MD Edwin Webster, [name]:  Edwin Webster is a 59 y.o.  thin appearing married Caucasian male father of 3 children, grandfather of 4 grandsons referred by Edwin Webster, his PCP for symptomatic bradycardia.  I last saw him in the office 08/24/2020. He is accompanied by his wife Edwin Webster today. He is retired from doing Dealer work. His risk factors include 30 to 40 pack years of tobacco abuse having quit 15 to 20 years ago as well as treated hypertension hyperlipidemia. There is no family history for heart disease. Never had a heart attack stroke. He does get atypical chest pain lasting 15 seconds at a time. Major complaints are of progressive disabling fatigue and dyspnea on exertion over the last 3 to 4 months with occasional dizziness. 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 A 2-week Zio patch did show sinus bradycardia in the high 30s and 40s during the daytime hours.  I referred him to Edwin Webster who saw him on 07/30/2020 and ultimately ended up putting a permanent transvenous pacemaker in him on 08/10/2020. His symptoms of fatigue and dyspnea as well as atypical chest pain completely resolved almost instantaneously. He did have a coronary CTA performed in 08/03/2020 that primarily showed nonobstructive disease except in the distal circumflex coronary artery.  His coronary calcium score was 665.  Since I saw him 2 months ago, he continues to do well.  He is completely asymptomatic.  His most recent lipid profile performed 10/06/2020 revealed total cholesterol 118, LDL 62, down from 91 previously, and HDL of 41.   Current Meds  Medication Sig  . aspirin EC 81 MG tablet Take 81 mg daily by mouth.  . busPIRone (BUSPAR) 5 MG tablet Take 1/2 to 1 tablet 3 x /day for Anxiety (Patient taking differently: Take 0.5-1 mg by mouth daily as needed. )  .  Cholecalciferol (VITAMIN D-3) 5000 UNITS TABS Take 5,000 Units by mouth 2 (two) times daily.   Marland Kitchen escitalopram (LEXAPRO) 20 MG tablet Take    1 tablet     Daily    for Mood  . ezetimibe (ZETIA) 10 MG tablet Take 1 tablet (10 mg total) by mouth daily.  . famciclovir (FAMVIR) 500 MG tablet TAKE 1 TABLET BY MOUTH EVERY DAY (Patient taking differently: Take 500 mg by mouth daily. )  . famotidine (PEPCID) 40 MG tablet Take 1 tabet Daily for Heartburn and Indigestion (Patient taking differently: Take 40 mg by mouth daily as needed for heartburn or indigestion. )  . folic acid (FOLVITE) 1 MG tablet Take      1 tablet       Daily  . ibuprofen (ADVIL,MOTRIN) 800 MG tablet Take 1 tablet (800 mg total) by mouth daily as needed. (Patient taking differently: Take 800 mg by mouth daily as needed for headache or moderate pain. )  . IMBRUVICA 280 MG TABS TAKE 1 TABLET (280 MG) BY MOUTH DAILY. (Patient taking differently: Take 280 mg by mouth daily. )  . levocetirizine (XYZAL) 5 MG tablet TAKE 1 TABLET BY MOUTH DAILY FOR ALLERGIES  . olmesartan (BENICAR) 40 MG tablet Take 1 tablet at Bedtime  for BP (Patient taking differently: Take 40 mg by mouth at bedtime. )  . rosuvastatin (CRESTOR) 40 MG tablet Take 1 tablet Daily for Cholesterol (Patient taking differently: Take 40 mg by mouth daily. )  . vitamin  B-12 (CYANOCOBALAMIN) 500 MCG tablet Take 500 mcg by mouth daily.     Allergies  Allergen Reactions  . Adalimumab Other (See Comments)    "blacked out", confused     Social History   Socioeconomic History  . Marital status: Married    Spouse name: Not on file  . Number of children: Not on file  . Years of education: Not on file  . Highest education level: Not on file  Occupational History  . Not on file  Tobacco Use  . Smoking status: Former Smoker    Packs/day: 2.00    Types: Cigarettes    Quit date: 07/03/2010    Years since quitting: 10.3  . Smokeless tobacco: Never Used  Vaping Use  . Vaping  Use: Never used  Substance and Sexual Activity  . Alcohol use: No    Alcohol/week: 0.0 standard drinks  . Drug use: No  . Sexual activity: Not on file  Other Topics Concern  . Not on file  Social History Narrative  . Not on file   Social Determinants of Health   Financial Resource Strain:   . Difficulty of Paying Living Expenses: Not on file  Food Insecurity:   . Worried About Charity fundraiser in the Last Year: Not on file  . Ran Out of Food in the Last Year: Not on file  Transportation Needs:   . Lack of Transportation (Medical): Not on file  . Lack of Transportation (Non-Medical): Not on file  Physical Activity:   . Days of Exercise per Week: Not on file  . Minutes of Exercise per Session: Not on file  Stress:   . Feeling of Stress : Not on file  Social Connections:   . Frequency of Communication with Friends and Family: Not on file  . Frequency of Social Gatherings with Friends and Family: Not on file  . Attends Religious Services: Not on file  . Active Member of Clubs or Organizations: Not on file  . Attends Archivist Meetings: Not on file  . Marital Status: Not on file  Intimate Partner Violence:   . Fear of Current or Ex-Partner: Not on file  . Emotionally Abused: Not on file  . Physically Abused: Not on file  . Sexually Abused: Not on file     Review of Systems: General: negative for chills, fever, night sweats or weight changes.  Cardiovascular: negative for chest pain, dyspnea on exertion, edema, orthopnea, palpitations, paroxysmal nocturnal dyspnea or shortness of breath Dermatological: negative for rash Respiratory: negative for cough or wheezing Urologic: negative for hematuria Abdominal: negative for nausea, vomiting, diarrhea, bright red blood per rectum, melena, or hematemesis Neurologic: negative for visual changes, syncope, or dizziness All other systems reviewed and are otherwise negative except as noted above.    Blood pressure  122/68, pulse 64, height 5\' 10"  (1.778 m), weight 180 lb (81.6 kg).  General appearance: alert and no distress Neck: no adenopathy, no carotid bruit, no JVD, supple, symmetrical, trachea midline and thyroid not enlarged, symmetric, no tenderness/mass/nodules Lungs: clear to auscultation bilaterally Heart: regular rate and rhythm, S1, S2 normal, no murmur, click, rub or gallop Extremities: extremities normal, atraumatic, no cyanosis or edema Pulses: 2+ and symmetric Skin: Skin color, texture, turgor normal. No rashes or lesions Neurologic: Alert and oriented X 3, normal strength and tone. Normal symmetric reflexes. Normal coordination and gait  EKG not performed today  ASSESSMENT AND PLAN:   Hyperlipidemia History of hyperlipidemia on high-dose Crestor and  Zetia with lipid profile performed 10/06/2020 revealing total cholesterol 118, LDL of 62 down from 91 previously and HDL 41.  Hypertension History of essential hypertension a blood pressure measured today at 122/68.  He is on olmesartan.  Atypical chest pain History of atypical chest pain with coronary calcium score 665 measured 08/03/2020 with distal circumflex disease.  I suspect his chest pain is noncardiac.  Sinus bradycardia History of symptomatic sinus bradycardia status post permanent transvenous pacemaker insertion by Edwin Webster 08/10/2020 with marked improvement of his symptoms of fatigue dyspnea and atypical chest pain.      Edwin Harp MD FACP,FACC,FAHA, [facility] 10/18/2020 10:48 AM

## 2020-10-18 NOTE — Patient Instructions (Signed)

## 2020-10-18 NOTE — Assessment & Plan Note (Signed)
History of hyperlipidemia on high-dose Crestor and Zetia with lipid profile performed 10/06/2020 revealing total cholesterol 118, LDL of 62 down from 91 previously and HDL 41.

## 2020-10-18 NOTE — Assessment & Plan Note (Signed)
History of essential hypertension a blood pressure measured today at 122/68.  He is on olmesartan.

## 2020-10-18 NOTE — Assessment & Plan Note (Signed)
History of symptomatic sinus bradycardia status post permanent transvenous pacemaker insertion by Dr. Curt Bears 08/10/2020 with marked improvement of his symptoms of fatigue dyspnea and atypical chest pain.

## 2020-10-24 ENCOUNTER — Other Ambulatory Visit: Payer: Self-pay | Admitting: Hematology & Oncology

## 2020-10-24 DIAGNOSIS — B029 Zoster without complications: Secondary | ICD-10-CM

## 2020-10-28 MED FILL — IMBRUVICA 280 MG TAB: 280 | 28 days supply | Qty: 28 | Fill #3

## 2020-11-09 ENCOUNTER — Encounter: Payer: Self-pay | Admitting: Adult Health

## 2020-11-09 ENCOUNTER — Ambulatory Visit: Payer: 59 | Admitting: Adult Health Nurse Practitioner

## 2020-11-09 ENCOUNTER — Ambulatory Visit (INDEPENDENT_AMBULATORY_CARE_PROVIDER_SITE_OTHER): Payer: Medicare Other | Admitting: Adult Health

## 2020-11-09 ENCOUNTER — Other Ambulatory Visit: Payer: Self-pay

## 2020-11-09 VITALS — BP 122/72 | HR 78 | Temp 97.5°F | Wt 172.0 lb

## 2020-11-09 DIAGNOSIS — I2510 Atherosclerotic heart disease of native coronary artery without angina pectoris: Secondary | ICD-10-CM

## 2020-11-09 DIAGNOSIS — J209 Acute bronchitis, unspecified: Secondary | ICD-10-CM | POA: Diagnosis not present

## 2020-11-09 DIAGNOSIS — R059 Cough, unspecified: Secondary | ICD-10-CM | POA: Diagnosis not present

## 2020-11-09 DIAGNOSIS — J069 Acute upper respiratory infection, unspecified: Secondary | ICD-10-CM

## 2020-11-09 DIAGNOSIS — Z1152 Encounter for screening for COVID-19: Secondary | ICD-10-CM

## 2020-11-09 LAB — POCT INFLUENZA A/B
Influenza A, POC: NEGATIVE
Influenza B, POC: NEGATIVE

## 2020-11-09 LAB — POC COVID19 BINAXNOW: SARS Coronavirus 2 Ag: NEGATIVE

## 2020-11-09 MED ORDER — AZITHROMYCIN 250 MG PO TABS: ORAL_TABLET | ORAL | 1 refills | Status: DC

## 2020-11-09 MED ORDER — FLUTICASONE PROPIONATE 50 MCG/ACT NA SUSP: 2.0000 | Freq: Every day | NASAL | 1 refills | Status: DC

## 2020-11-09 MED ORDER — PROMETHAZINE-DM 6.25-15 MG/5ML PO SYRP: 5.0000 mL | ORAL_SOLUTION | Freq: Four times a day (QID) | ORAL | 1 refills | Status: DC | PRN

## 2020-11-09 NOTE — Progress Notes (Signed)
Assessment and Plan:  Edwin Webster was seen today for acute visit.  Diagnoses and all orders for this visit:  URI with cough and congestion With bronchitic cough, hx of rapid progression to pneumonia and high risk med hx Will treat aggressively Suggested symptomatic OTC remedies. Nasal saline spray for congestion. Nasal steroids, allergy pill, oral steroids offered (declines due to intolerance)  Follow up as needed if no improvement in 24-48 hours or if any new dyspnea, CP -     azithromycin (ZITHROMAX) 250 MG tablet; TAKE 2 TABLETS BY MOUTH TODAY, THEN TAKE 1 TABLET DAILY FOR 4 DAYS -     promethazine-dextromethorphan (PROMETHAZINE-DM) 6.25-15 MG/5ML syrup; Take 5 mLs by mouth 4 (four) times daily as needed for cough. -     fluticasone (FLONASE) 50 MCG/ACT nasal spray; Place 2 sprays into both nostrils at bedtime. -     POC COVID-19 -     POCT Influenza A/B  Further disposition pending results of labs. Discussed med's effects and SE's.   Over 20 minutes of exam, counseling, chart review, and critical decision making was performed.   Future Appointments  Date Time Provider [place]  11/10/2020  7:10 AM [facility] DEVICE REMOTES [facility]  11/24/2020  3:00 PM Constance Haw, MD [facility]  12/06/2020 11:00 AM Liane Comber, NP [facility] None  01/19/2021 10:00 AM [facility] None  01/19/2021 10:30 AM [name], MD [facility] None  02/09/2021  7:10 AM [facility] DEVICE REMOTES [facility]  03/14/2021 11:30 AM Unk Pinto, MD [facility] None  05/11/2021  7:10 AM [facility] DEVICE REMOTES [facility]  08/10/2021  7:10 AM [facility] DEVICE REMOTES [facility]  09/18/2021  2:00 PM Unk Pinto, MD [facility] None  11/09/2021  7:10 AM [facility] DEVICE REMOTES [facility]     ------------------------------------------------------------------------------------------------------------------   HPI BP 122/72   Pulse 78   Temp (!) 97.5 F (36.4 C)   Wt 172 lb (78 kg)   SpO2 94%   BMI 24.68 kg/m   Edwin Webster with seronegative RA, Waldenstrom's macroglobinemia presents for evaluation of URI sx; Edwin Webster had negative rapid covid 19 test prior to OV here, negative rapid flu.   Edwin Webster reports 3 days ago started with running nose, rapidly progressed to coughing (productive thick green mucus), nasal congestion, post nasal drip, now with sense of progressing to chest congestion. Denies HA, myalgias/arthralgias, rash, GI sx. Denies dyspnea, CP, wheezing. This AM states coughed up mucus with some blood, was very concerned due to hx of URI rapidly progressing to pneumonia and presented for evaluation.   Edwin Webster has tried sudafed, mucinex, unknown prescription cough syrup without improvement.    Past Medical History:  Diagnosis Date  . Allergy   . Anemia   . Anxiety   . Arthritis   . Chest pain, non-cardiac   . COPD (chronic obstructive pulmonary disease) ([place])   . Counseling regarding goals of care 03/27/2018  . Depression   . Diverticulitis   . Diverticulosis   . Fractured pelvis ([place])    fall from ladder  . GERD (gastroesophageal reflux disease)   . Gout   . History of shingles   . Hyperlipidemia   . Internal hemorrhoids   . Iron deficiency anemia 03/13/2018  . Kidney stones   . Other abnormal glucose 10/11/2015  . Rheumatoid arthritis ([place])    hands  . Waldenstrom's macroglobulinemia ([place]) 03/27/2018     Allergies  Allergen Reactions  . Adalimumab Other (See Comments)    "blacked out",  confused     Current Outpatient Medications on File Prior to Visit  Medication Sig  . aspirin EC 81 MG tablet Take 81 mg daily by mouth.  . busPIRone (BUSPAR) 5 MG tablet Take 1/2 to 1 tablet 3 x /day for Anxiety (Patient taking differently: Take 0.5-1 mg by mouth daily as  needed. )  . Cholecalciferol (VITAMIN D-3) 5000 UNITS TABS Take 5,000 Units by mouth 2 (two) times daily.   Marland Kitchen escitalopram (LEXAPRO) 20 MG tablet Take    1 tablet     Daily    for Mood  . ezetimibe (ZETIA) 10 MG tablet Take 1 tablet (10 mg total) by mouth daily.  . famciclovir (FAMVIR) 500 MG tablet TAKE 1 TABLET BY MOUTH EVERY DAY  . famotidine (PEPCID) 40 MG tablet Take 1 tabet Daily for Heartburn and Indigestion (Patient taking differently: Take 40 mg by mouth daily as needed for heartburn or indigestion. )  . folic acid (FOLVITE) 1 MG tablet Take      1 tablet       Daily  . ibuprofen (ADVIL,MOTRIN) 800 MG tablet Take 1 tablet (800 mg total) by mouth daily as needed. (Patient taking differently: Take 800 mg by mouth daily as needed for headache or moderate pain. )  . IMBRUVICA 280 MG TABS TAKE 1 TABLET (280 MG) BY MOUTH DAILY. (Patient taking differently: Take 280 mg by mouth daily. )  . levocetirizine (XYZAL) 5 MG tablet TAKE 1 TABLET BY MOUTH DAILY FOR ALLERGIES  . olmesartan (BENICAR) 40 MG tablet Take 1 tablet at Bedtime  for BP (Patient taking differently: Take 40 mg by mouth at bedtime. )  . rosuvastatin (CRESTOR) 40 MG tablet Take 1 tablet Daily for Cholesterol (Patient taking differently: Take 40 mg by mouth daily. )  . vitamin B-12 (CYANOCOBALAMIN) 500 MCG tablet Take 500 mcg by mouth daily.   No current facility-administered medications on file prior to visit.    ROS: all negative except above.   Physical Exam:  BP 122/72   Pulse 78   Temp (!) 97.5 F (36.4 C)   Wt 172 lb (78 kg)   SpO2 94%   BMI 24.68 kg/m   General Appearance: Well nourished, in no apparent distress. Eyes: PERRLA, EOMs, conjunctiva no swelling or erythema Sinuses: No Frontal/maxillary tenderness ENT/Mouth: Ext aud canals clear, TMs without erythema, bulging. No erythema, swelling, or exudate on post pharynx.  Tonsils not swollen or erythematous. Hearing normal.  Neck: Supple, thyroid normal.   Respiratory: Respiratory effort normal, BS equal bilaterally with bil rales to bases, clears with cough, no rhonchi, wheezing or stridor. Hacking wet cough.  Cardio: RRR with no MRGs. Brisk peripheral pulses without edema.  Abdomen: Soft, + BS.  Non tender. Lymphatics: Non tender without lymphadenopathy.  Musculoskeletal: normal gait.  Skin: Warm, dry without rashes, lesions, ecchymosis.  Neuro: Normal muscle tone Psych: Awake and oriented X 3, normal affect, Insight and Judgment appropriate.     [name], NP 9:41 AM [facility]

## 2020-11-09 NOTE — Patient Instructions (Addendum)

## 2020-11-10 ENCOUNTER — Ambulatory Visit (INDEPENDENT_AMBULATORY_CARE_PROVIDER_SITE_OTHER): Payer: Medicare Other

## 2020-11-10 DIAGNOSIS — R001 Bradycardia, unspecified: Secondary | ICD-10-CM

## 2020-11-10 LAB — CUP PACEART REMOTE DEVICE CHECK
Battery Remaining Longevity: 109 mo
Battery Remaining Percentage: 95.5 %
Battery Voltage: 3.04 V
Brady Statistic AP VP Percent: 1 %
Brady Statistic AP VS Percent: 72 %
Brady Statistic AS VP Percent: 1 %
Brady Statistic AS VS Percent: 28 %
Brady Statistic RA Percent Paced: 72 %
Brady Statistic RV Percent Paced: 1 %
Date Time Interrogation Session: 20211216020014
Implantable Lead Implant Date: 20210915
Implantable Lead Implant Date: 20210915
Implantable Lead Location: 753859
Implantable Lead Location: 753860
Implantable Pulse Generator Implant Date: 20210915
Lead Channel Impedance Value: 450 Ohm
Lead Channel Impedance Value: 540 Ohm
Lead Channel Pacing Threshold Amplitude: 0.5 V
Lead Channel Pacing Threshold Amplitude: 0.75 V
Lead Channel Pacing Threshold Pulse Width: 0.5 ms
Lead Channel Pacing Threshold Pulse Width: 0.5 ms
Lead Channel Sensing Intrinsic Amplitude: 12 mV
Lead Channel Sensing Intrinsic Amplitude: 2.4 mV
Lead Channel Setting Pacing Amplitude: 1.5 V
Lead Channel Setting Pacing Amplitude: 3.5 V
Lead Channel Setting Pacing Pulse Width: 0.5 ms
Lead Channel Setting Sensing Sensitivity: 2 mV
Pulse Gen Model: 2272
Pulse Gen Serial Number: 3864545

## 2020-11-11 ENCOUNTER — Other Ambulatory Visit: Payer: Self-pay | Admitting: Internal Medicine

## 2020-11-23 ENCOUNTER — Other Ambulatory Visit: Payer: Self-pay | Admitting: Hematology & Oncology

## 2020-11-23 DIAGNOSIS — C880 Waldenstrom macroglobulinemia: Secondary | ICD-10-CM

## 2020-11-24 ENCOUNTER — Encounter: Payer: Self-pay | Admitting: Cardiology

## 2020-11-24 ENCOUNTER — Ambulatory Visit (INDEPENDENT_AMBULATORY_CARE_PROVIDER_SITE_OTHER): Payer: Medicare Other | Admitting: Cardiology

## 2020-11-24 ENCOUNTER — Other Ambulatory Visit: Payer: Self-pay

## 2020-11-24 DIAGNOSIS — R001 Bradycardia, unspecified: Secondary | ICD-10-CM | POA: Diagnosis not present

## 2020-11-24 DIAGNOSIS — I2510 Atherosclerotic heart disease of native coronary artery without angina pectoris: Secondary | ICD-10-CM | POA: Diagnosis not present

## 2020-11-24 NOTE — Progress Notes (Signed)
Remote pacemaker transmission.   

## 2020-11-24 NOTE — Progress Notes (Signed)
Electrophysiology Office Note   Date:  11/24/2020   ID:  Edwin Webster, DOB [birth year], MRN [id number]  PCP:  Lucky Cowboy, MD  Cardiologist:  Allyson Sabal Primary Electrophysiologist:  Rasheedah Reis Jorja Loa, MD    Chief Complaint: palpitations   History of Present Illness: Edwin Webster is a 59 y.o. male who is being seen today for the evaluation of SVT at the request of Lucky Cowboy, MD. Presenting today for electrophysiology evaluation.  He has a history significant for COPD, hyperlipidemia, Waldenstrm's macroglobulinemia.  He wore a 2-week Zio patch that showed episodes of sinus bradycardia with heart rates into the 30s and 40s.  This occurred during daytime hours.  He has complaints of fatigue and dyspnea.  He is now status post Saint Jude dual-chamber pacemaker implanted 08/10/2020.  Today, denies symptoms of palpitations, chest pain, shortness of breath, orthopnea, PND, lower extremity edema, claudication, dizziness, presyncope, syncope, bleeding, or neurologic sequela. The patient is tolerating medications without difficulties.  Since his pacemaker was implanted he has done well.  He has no chest pain or shortness of breath and is able do all of his daily activities.  He does say that he has quite a bit more energy, and is able to work throughout most of the day without complaint.   Past Medical History:  Diagnosis Date  . Allergy   . Anemia   . Anxiety   . Arthritis   . Chest pain, non-cardiac   . COPD (chronic obstructive pulmonary disease) (HCC)   . Counseling regarding goals of care 03/27/2018  . Depression   . Diverticulitis   . Diverticulosis   . Fractured pelvis (HCC)    fall from ladder  . GERD (gastroesophageal reflux disease)   . Gout   . History of shingles   . Hyperlipidemia   . Internal hemorrhoids   . Iron deficiency anemia 03/13/2018  . Kidney stones   . Other abnormal glucose 10/11/2015  . Rheumatoid arthritis (HCC)    hands  . Waldenstrom's  macroglobulinemia (HCC) 03/27/2018   Past Surgical History:  Procedure Laterality Date  . COLONOSCOPY    . FINGER SURGERY    . iron infusion    . KNEE SURGERY     right  . PACEMAKER IMPLANT N/A 08/10/2020   Procedure: PACEMAKER IMPLANT;  Surgeon: Regan Lemming, MD;  Location: [facility];  Service: Cardiovascular;  Laterality: N/A;  . ROTATOR CUFF REPAIR  1996   left     Current Outpatient Medications  Medication Sig Dispense Refill  . aspirin EC 81 MG tablet Take 81 mg daily by mouth.    Marland Kitchen azithromycin (ZITHROMAX) 250 MG tablet TAKE 2 TABLETS BY MOUTH TODAY, THEN TAKE 1 TABLET DAILY FOR 4 DAYS 6 tablet 1  . busPIRone (BUSPAR) 5 MG tablet Take 1/2 to 1 tablet 3 x /day for Anxiety 270 tablet 1  . Cholecalciferol (VITAMIN D-3) 5000 UNITS TABS Take 5,000 Units by mouth 2 (two) times daily.     Marland Kitchen escitalopram (LEXAPRO) 20 MG tablet TAKE 1 TABLET BY MOUTH DAILY FOR MOOD 90 tablet 3  . ezetimibe (ZETIA) 10 MG tablet Take 1 tablet (10 mg total) by mouth daily. 90 tablet 3  . famciclovir (FAMVIR) 500 MG tablet TAKE 1 TABLET BY MOUTH EVERY DAY 90 tablet 3  . famotidine (PEPCID) 40 MG tablet Take 1 tabet Daily for Heartburn and Indigestion 90 tablet 3  . fluticasone (FLONASE) 50 MCG/ACT nasal spray Place 2 sprays into both  nostrils at bedtime. 16 g 1  . folic acid (FOLVITE) 1 MG tablet Take      1 tablet       Daily 90 tablet 0  . ibuprofen (ADVIL,MOTRIN) 800 MG tablet Take 1 tablet (800 mg total) by mouth daily as needed. 90 tablet 1  . IMBRUVICA 280 MG TABS TAKE 1 TABLET (280 MG) BY MOUTH DAILY. 28 tablet 3  . levocetirizine (XYZAL) 5 MG tablet TAKE 1 TABLET BY MOUTH DAILY FOR ALLERGIES 90 tablet 3  . olmesartan (BENICAR) 40 MG tablet Take 40 mg by mouth at bedtime. For BP    . promethazine-dextromethorphan (PROMETHAZINE-DM) 6.25-15 MG/5ML syrup Take 5 mLs by mouth 4 (four) times daily as needed for cough. 240 mL 1  . rosuvastatin (CRESTOR) 40 MG tablet Take 1 tablet Daily for  Cholesterol 90 tablet 3  . vitamin B-12 (CYANOCOBALAMIN) 500 MCG tablet Take 500 mcg by mouth daily.     No current facility-administered medications for this visit.    Allergies:   Adalimumab   Social History:  The patient  reports that he quit smoking about 10 years ago. His smoking use included cigarettes. He smoked 2.00 packs per day. He has never used smokeless tobacco. He reports that he does not drink alcohol and does not use drugs.   Family History:  The patient's family history includes COPD in his maternal grandfather and mother; Cancer in his mother; Colon cancer (age of onset: 95) in his maternal grandfather; Heart failure in his mother; Hyperlipidemia in his brother and mother; Hypertension in his father; Stroke in his maternal grandfather.    ROS:  Please see the history of present illness.   Otherwise, review of systems is positive for none.   All other systems are reviewed and negative.   PHYSICAL EXAM: VS:  BP 122/78   Pulse 60   Ht 5\' 10"  (1.778 m)   Wt 179 lb 6.4 oz (81.4 kg)   SpO2 97%   BMI 25.74 kg/m  , BMI Body mass index is 25.74 kg/m. GEN: Well nourished, well developed, in no acute distress  HEENT: normal  Neck: no JVD, carotid bruits, or masses Cardiac: RRR; no murmurs, rubs, or gallops,no edema  Respiratory:  clear to auscultation bilaterally, normal work of breathing GI: soft, nontender, nondistended, + BS MS: no deformity or atrophy  Skin: warm and dry, device site well healed Neuro:  Strength and sensation are intact Psych: euthymic mood, full affect  EKG:  EKG is ordered today. Personal review of the ekg ordered shows sinus rhythm, rate 60  Personal review of the device interrogation today. Results in [place]: 09/01/2020: Magnesium 2.4; TSH 2.88 09/28/2020: BUN 15; Creatinine 1.22; Hemoglobin 12.5; Platelet Count 211; Potassium 3.9; Sodium 140 10/06/2020: ALT 40    Lipid Panel     Component Value Date/Time   CHOL 118  10/06/2020 0827   TRIG 71 10/06/2020 0827   HDL 41 10/06/2020 0827   CHOLHDL 2.9 10/06/2020 0827   CHOLHDL 4.6 09/01/2020 1549   VLDL 12 04/16/2017 1712   LDLCALC 62 10/06/2020 0827   LDLCALC 149 (H) 09/01/2020 1549     Wt Readings from Last 3 Encounters:  11/24/20 179 lb 6.4 oz (81.4 kg)  11/09/20 172 lb (78 kg)  10/18/20 180 lb (81.6 kg)      Other studies Reviewed: Additional studies/ records that were reviewed today include: TTE 07/27/20  Review of the above records today demonstrates:  1. Left ventricular  ejection fraction, by estimation, is 55 to 60%. The  left ventricle has normal function. The left ventricle has no regional  wall motion abnormalities. Left ventricular diastolic parameters were  normal.  2. Right ventricular systolic function is normal. The right ventricular  size is normal.  3. The mitral valve is normal in structure. No evidence of mitral valve  regurgitation. No evidence of mitral stenosis.  4. The aortic valve is normal in structure. Aortic valve regurgitation is  mild. Mild aortic valve sclerosis is present, with no evidence of aortic  valve stenosis.  5. The inferior vena cava is normal in size with greater than 50%  respiratory variability, suggesting right atrial pressure of 3 mmHg.   Monitor 07/10/20 personally reviewed 1. SR/SB/ST 2. Occasional PVCs/PACs 3. Short runs of SVT  ASSESSMENT AND PLAN:  1.  Sick sinus syndrome: Status post Saint Jude dual-chamber pacemaker implanted 08/10/2020.  Device appropriately.  No changes.    2.  Hypertension: Currently well controlled  3.  Hyperlipidemia: Continue Crestor per primary cardiology   4.  Paroxysmal atrial fibrillation: Found on device interrogation.  He had approximately 1 hour of atrial fibrillation that occurred while he was sleeping.  No symptoms.  We Alexee Delsanto hold off on anticoagulation for now due to her short episodes.  CHA2DS2-VASc of 1.  Current medicines are reviewed at length  with the patient today.   The patient does not have concerns regarding his medicines.  The following changes were made today: None  Labs/ tests ordered today include:  No orders of the defined types were placed in this encounter.    Disposition:   FU with Nicky Milhouse 12 months  Signed, Dalbert Stillings Meredith Leeds, MD  11/24/2020 2:56 PM     [facility] [address] [place] [address] [phone] (office) [phone] (fax)

## 2020-12-01 ENCOUNTER — Other Ambulatory Visit: Payer: Self-pay | Admitting: Adult Health

## 2020-12-01 MED FILL — IMBRUVICA 280 MG TAB: 280 | 28 days supply | Qty: 28 | Fill #0

## 2020-12-05 NOTE — Progress Notes (Signed)
MEDICARE ANNUAL WELLNESS VISIT AND FOLLOW UP Assessment:   Edwin Webster was seen today for follow-up and medicare wellness.  Diagnoses and all orders for this visit:  Welcome to Medicare preventive visit - AAA screening negative - pneumonia 23 valent administered due to immunosuppressed  Primary hypertension Continue medication Monitor blood pressure at home; call if consistently over 130/80 Continue DASH diet.   Reminder to go to the ER if any CP, SOB, nausea, dizziness, severe HA, changes vision/speech, left arm numbness and tingling and jaw pain. -     CBC with Differential/Platelet -     COMPLETE METABOLIC PANEL WITH GFR -     Magnesium  Sinus bradycardia Off of rate controlling drugs; monitor   Seronegative rheumatoid arthritis (HCC) Off of methotrexate; using NSAID prn; discussed voltaren  Waldenstrom's macroglobulinemia ([place]) Oncology following; recently well controlled  Major depressive disorder, recurrent episode, mild with anxious distress ([place]) Continue medications  Lifestyle discussed: diet/exerise, sleep hygiene, stress management, hydration  Hyperlipidemia Continue medications Continue low cholesterol diet and exercise.  Check lipid panel.  -     Lipid panel -     TSH  Vitamin D deficiency At goal at recent check; continue to recommend supplementation for goal of 60-100 Defer vitamin D level  Medication management -     CBC with Differential/Platelet -     COMPLETE METABOLIC PANEL WITH GFR -     Magnesium  Abnormal glucose Recent A1Cs at goal Discussed diet/exercise, weight management  Defer A1C; check CMP -     COMPLETE METABOLIC PANEL WITH GFR  BMI 24.0-24.9, adult Continue to recommend diet heavy in fruits and veggies and low in animal meats, cheeses, and dairy products, appropriate calorie intake Discuss exercise recommendations routinely Continue to monitor weight at each visit  Iron deficiency anemia due to chronic blood loss Supplement iron  as needed; hematology is following -     CBC with Differential/Platelet  Ventral hernia without obstruction or gangrene Monitor  History of basal cell cancer Derm following   Screening for colon cancer -     Ambulatory referral to Gastroenterology  Need for pneumococcal vaccine -     Pneumococcal polysaccharide vaccine 23-valent greater than or equal to 2yo subcutaneous/IM  Rhinitis -     azelastine (ASTELIN) 0.1 % nasal spray; Place 2 sprays into both nostrils 2 (two) times daily. Use in each nostril as directed   Over 30 minutes of exam, counseling, chart review, and critical decision making was performed  Future Appointments  Date Time Provider [place]  01/19/2021 10:00 AM [facility] None  01/19/2021 10:30 AM Ennever, Rudell Cobb, MD [facility] None  02/09/2021  7:10 AM [facility] DEVICE REMOTES [facility]  03/14/2021 11:30 AM Unk Pinto, MD [facility] None  05/11/2021  7:10 AM [facility] DEVICE REMOTES [facility]  08/10/2021  7:10 AM [facility] DEVICE REMOTES [facility]  09/18/2021  2:00 PM Unk Pinto, MD [facility] None  11/09/2021  7:10 AM [facility] DEVICE REMOTES [facility]  12/06/2021 11:00 AM Liane Comber, NP [facility] None     Plan:   During the course of the visit the patient was educated and counseled about appropriate screening and preventive services including:    Pneumococcal vaccine   Influenza vaccine  Prevnar 13  Td vaccine  Screening electrocardiogram  Colorectal cancer screening  Diabetes screening  Glaucoma screening  Nutrition counseling    Subjective:  Edwin Webster is a 60 y.o. male who presents for Medicare Annual Wellness Visit and  3 month follow up for HTN, hyperlipidemia, glucose management, and vitamin D Def.   He has basal cell to his nose, had removal scheduled at skin surgery center in Jan 2021 and continues with annual follow.   Pateint has  as sero-negative RA by U/S with negative RA factor & Anti-CCP and is followed by Dr Lenna Gilford. Off of methotrexate, takes NSAID PRN, mostly inhands, and doing well.   He was diagnosed with Waldenstrom's macroglobulinemia 2019 after persistent unexplained fatigue; he now follows with Dr. Marin Olp, Rituxan/bendamustine-s/pcycle 2, Imbruvica 420 mg po q day and reports significant improvement with this, ECOG of 1. He is retired. He has iron deficiency anemia that is also monitored, IV iron as indicated.   he has a diagnosis of depression/anxiety and is currently on lexapro 20 mg daily, buspar 5 mg TID PRN, uses 1-2 times a month, has been able to taper off of xanax.   BMI is Body mass index is 25.97 kg/m., he has been working on diet and exercise. Walking 10-15 min most days weather permitting, thinking about treadmill.  Wt Readings from Last 3 Encounters:  12/06/20 181 lb (82.1 kg)  11/24/20 179 lb 6.4 oz (81.4 kg)  11/09/20 172 lb (78 kg)   His blood pressure has been controlled at home, today their BP is BP: 114/72 He does workout. He denies chest pain, shortness of breath, dizziness.   He is on cholesterol medication (rosuvastatin 40 mg daily and zetia 10 mg daily) and denies myalgias. His cholesterol is at goal. The cholesterol last visit was:   Lab Results  Component Value Date   CHOL 118 10/06/2020   HDL 41 10/06/2020   LDLCALC 62 10/06/2020   TRIG 71 10/06/2020   CHOLHDL 2.9 10/06/2020   He has been working on diet and exercise for glucose management, and denies nausea, paresthesia of the feet, polydipsia, polyuria, visual disturbances and vomiting. Last A1C in the office was:  Lab Results  Component Value Date   HGBA1C 5.5 09/01/2020   Last GFR Lab Results  Component Value Date   GFRNONAA >60 09/28/2020   Patient is on Vitamin D supplement, was taking 10000 IU BID, now daily only Lab Results  Component Value Date   VD25OH 103 (H) 09/01/2020      Medication  Review:   Current Outpatient Medications (Cardiovascular):    olmesartan (BENICAR) 40 MG tablet, Take 40 mg by mouth at bedtime. For BP   rosuvastatin (CRESTOR) 40 MG tablet, Take 1 tablet Daily for Cholesterol   ezetimibe (ZETIA) 10 MG tablet, Take 1 tablet (10 mg total) by mouth daily.  Current Outpatient Medications (Respiratory):    azelastine (ASTELIN) 0.1 % nasal spray, Place 2 sprays into both nostrils 2 (two) times daily. Use in each nostril as directed   fluticasone (FLONASE) 50 MCG/ACT nasal spray, INSTILL 2 SPRAYS INTO EACH NOSTRIL DAILY AT BEDTIME   levocetirizine (XYZAL) 5 MG tablet, TAKE 1 TABLET BY MOUTH DAILY FOR ALLERGIES   promethazine-dextromethorphan (PROMETHAZINE-DM) 6.25-15 MG/5ML syrup, Take 5 mLs by mouth 4 (four) times daily as needed for cough.  Current Outpatient Medications (Analgesics):    aspirin EC 81 MG tablet, Take 81 mg daily by mouth.   ibuprofen (ADVIL,MOTRIN) 800 MG tablet, Take 1 tablet (800 mg total) by mouth daily as needed.  Current Outpatient Medications (Hematological):    folic acid (FOLVITE) 1 MG tablet, Take      1 tablet       Daily   vitamin B-12 (CYANOCOBALAMIN)  500 MCG tablet, Take 500 mcg by mouth daily.  Current Outpatient Medications (Other):    azithromycin (ZITHROMAX) 250 MG tablet, TAKE 2 TABLETS BY MOUTH TODAY, THEN TAKE 1 TABLET DAILY FOR 4 DAYS   busPIRone (BUSPAR) 5 MG tablet, Take 1/2 to 1 tablet 3 x /day for Anxiety   Cholecalciferol (VITAMIN D-3) 5000 UNITS TABS, Take 5,000 Units by mouth 2 (two) times daily.    escitalopram (LEXAPRO) 20 MG tablet, TAKE 1 TABLET BY MOUTH DAILY FOR MOOD   famciclovir (FAMVIR) 500 MG tablet, TAKE 1 TABLET BY MOUTH EVERY DAY   famotidine (PEPCID) 40 MG tablet, Take 1 tabet Daily for Heartburn and Indigestion   IMBRUVICA 280 MG TABS, TAKE 1 TABLET (280 MG) BY MOUTH DAILY.  Allergies: Allergies  Allergen Reactions   Adalimumab Other (See Comments)    "blacked out",  confused     Current Problems (verified) has Hyperlipidemia; Hypertension; Vitamin D deficiency; Medication management; Seronegative rheumatoid arthritis ([place]); Abnormal glucose; BMI 24.0-24.9, adult; Gonalgia; Ventral hernia; Iron deficiency anemia; Waldenstrom's macroglobulinemia ([place]); Major depressive disorder, recurrent episode, mild with anxious distress ([place]); Sinus bradycardia; Atypical chest pain; and History of basal cell cancer on their problem list.  Screening Tests Immunization History  Administered Date(s) Administered   DTaP 06/04/2011   Hepatitis B 11/26/2010   Hepatitis B, ped/adol 08/06/2011   Influenza Inj Mdck Quad With Preservative 09/13/2017, 08/21/2018, 09/01/2020   Influenza Split 09/02/2014   Influenza Whole 09/17/2013   Influenza, Seasonal, Injecte, Preservative Fre 10/11/2015   Influenza-Unspecified 09/13/2017   PFIZER SARS-COV-2 Vaccination 01/24/2020, 03/06/2020, 08/03/2020   PPD Test 10/11/2015, 08/05/2019, 09/01/2020   Pneumococcal Polysaccharide-23 12/06/2020    Preventative care: Last colonoscopy: 09/2015, hx of polyps, Dr. Hilarie Fredrickson, 5 year follow up DUE  Prior vaccinations: TD or Tdap: 05/2011 will get PRN due to cost Influenza: 08/2020 Pneumococcal: - ? Needs early, will check with oncologist Shingles/Zostavax: contraindicated, takes famciclovir daily prophylaxis   Names of Other Physician/Practitioners you currently use: 1. [facility] here for primary care 2. Dr. Gershon Crane, eye doctor, last visit 2021, glasses 3. none, dentist, last visit remote, full dentures  Patient Care Team: Unk Pinto, MD as PCP - General (Internal Medicine) Constance Haw, MD as PCP - Electrophysiology (Cardiology)  Surgical: He  has a past surgical history that includes Rotator cuff repair (1996); Knee surgery; Colonoscopy; Finger surgery; iron infusion; and PACEMAKER IMPLANT (N/A, 08/10/2020). Family His  family history includes COPD in his maternal grandfather and mother; Cancer in his mother; Colon cancer (age of onset: 61) in his maternal grandfather; Heart failure in his mother; Hyperlipidemia in his brother and mother; Hypertension in his father; Stroke in his maternal grandfather. Social history  He reports that he quit smoking about 10 years ago. His smoking use included cigarettes. He smoked 2.00 packs per day. He has never used smokeless tobacco. He reports that he does not drink alcohol and does not use drugs.  MEDICARE WELLNESS OBJECTIVES: Physical activity: Current Exercise Habits: Home exercise routine, Type of exercise: walking, Time (Minutes): 15, Frequency (Times/Week): 7, Weekly Exercise (Minutes/Week): 105, Intensity: Mild, Exercise limited by: None identified Cardiac risk factors: Cardiac Risk Factors include: advanced age (>49men, >2 women);male gender;hypertension;dyslipidemia;smoking/ tobacco exposure Depression/mood screen:   Depression screen [place] 2/9 12/06/2020  Decreased Interest 0  Down, Depressed, Hopeless 0  PHQ - 2 Score 0    ADLs:  In your present state of health, do you have any difficulty performing the following activities: 12/06/2020  08/31/2020  Hearing? N N  Vision? N N  Difficulty concentrating or making decisions? N N  Walking or climbing stairs? N N  Dressing or bathing? N N  Doing errands, shopping? N N  Some recent data might be hidden     Cognitive Testing  Alert? Yes  Normal Appearance?Yes  Oriented to person? Yes  Place? Yes   Time? Yes  Recall of three objects?  Yes  Can perform simple calculations? Yes  Displays appropriate judgment?Yes  Can read the correct time from a watch face?Yes  EOL planning: Does Patient Have a Medical Advance Directive?: Yes Type of Advance Directive: [place] will Does patient want to make changes to medical advance directive?: No - Patient declined Copy of [place]  in Chart?: No - copy requested   Objective:   Today's Vitals   12/06/20 1056  BP: 114/72  Pulse: 64  Temp: (!) 97.5 F (36.4 C)  SpO2: 97%  Weight: 181 lb (82.1 kg)   Body mass index is 25.97 kg/m.  General appearance: alert, no distress, WD/WN, male HEENT: normocephalic, sclerae anicteric, TMs pearly, nares patent, no discharge or erythema, pharynx normal Oral cavity: MMM, no lesions Neck: supple, no lymphadenopathy, no thyromegaly, no masses Heart: RRR, normal S1, S2, no murmurs Lungs: CTA bilaterally, no wheezes, rhonchi, or rales Abdomen: +bs, soft, non tender, non distended, no masses, no hepatomegaly, no splenomegaly Musculoskeletal: nontender, no swelling, no obvious deformity Extremities: no edema, no cyanosis, no clubbing Pulses: 2+ symmetric, upper and lower extremities, normal cap refill Neurological: alert, oriented x 3, CN2-12 intact, strength normal upper extremities and lower extremities, sensation normal throughout, DTRs 2+ throughout, no cerebellar signs, gait normal Psychiatric: normal affect, behavior normal, pleasant   Medicare Attestation I have personally reviewed: The patient's medical and social history Their use of alcohol, tobacco or illicit drugs Their current medications and supplements The patient's functional ability including ADLs,fall risks, home safety risks, cognitive, and hearing and visual impairment Diet and physical activities Evidence for depression or mood disorders  The patient's weight, height, BMI, and visual acuity have been recorded in the chart.  I have made referrals, counseling, and provided education to the patient based on review of the above and I have provided the patient with a written personalized care plan for preventive services.     [name], NP   12/06/2020

## 2020-12-06 ENCOUNTER — Other Ambulatory Visit: Payer: Self-pay

## 2020-12-06 ENCOUNTER — Ambulatory Visit (INDEPENDENT_AMBULATORY_CARE_PROVIDER_SITE_OTHER): Payer: Medicare Other | Admitting: Adult Health

## 2020-12-06 ENCOUNTER — Encounter: Payer: Self-pay | Admitting: Adult Health

## 2020-12-06 VITALS — BP 114/72 | HR 64 | Temp 97.5°F | Wt 181.0 lb

## 2020-12-06 DIAGNOSIS — Z1211 Encounter for screening for malignant neoplasm of colon: Secondary | ICD-10-CM

## 2020-12-06 DIAGNOSIS — Z6824 Body mass index (BMI) 24.0-24.9, adult: Secondary | ICD-10-CM

## 2020-12-06 DIAGNOSIS — Z87891 Personal history of nicotine dependence: Secondary | ICD-10-CM

## 2020-12-06 DIAGNOSIS — R6889 Other general symptoms and signs: Secondary | ICD-10-CM

## 2020-12-06 DIAGNOSIS — M0600 Rheumatoid arthritis without rheumatoid factor, unspecified site: Secondary | ICD-10-CM

## 2020-12-06 DIAGNOSIS — E559 Vitamin D deficiency, unspecified: Secondary | ICD-10-CM

## 2020-12-06 DIAGNOSIS — E782 Mixed hyperlipidemia: Secondary | ICD-10-CM

## 2020-12-06 DIAGNOSIS — R001 Bradycardia, unspecified: Secondary | ICD-10-CM

## 2020-12-06 DIAGNOSIS — Z79899 Other long term (current) drug therapy: Secondary | ICD-10-CM

## 2020-12-06 DIAGNOSIS — F330 Major depressive disorder, recurrent, mild: Secondary | ICD-10-CM

## 2020-12-06 DIAGNOSIS — D500 Iron deficiency anemia secondary to blood loss (chronic): Secondary | ICD-10-CM

## 2020-12-06 DIAGNOSIS — Z0001 Encounter for general adult medical examination with abnormal findings: Secondary | ICD-10-CM | POA: Diagnosis not present

## 2020-12-06 DIAGNOSIS — C8800 Waldenstrom macroglobulinemia not having achieved remission: Secondary | ICD-10-CM

## 2020-12-06 DIAGNOSIS — Z85828 Personal history of other malignant neoplasm of skin: Secondary | ICD-10-CM

## 2020-12-06 DIAGNOSIS — Z23 Encounter for immunization: Secondary | ICD-10-CM

## 2020-12-06 DIAGNOSIS — Z0000 Encounter for general adult medical examination without abnormal findings: Secondary | ICD-10-CM

## 2020-12-06 DIAGNOSIS — R7309 Other abnormal glucose: Secondary | ICD-10-CM

## 2020-12-06 DIAGNOSIS — K439 Ventral hernia without obstruction or gangrene: Secondary | ICD-10-CM

## 2020-12-06 DIAGNOSIS — I10 Essential (primary) hypertension: Secondary | ICD-10-CM

## 2020-12-06 LAB — CBC WITH DIFFERENTIAL/PLATELET
Absolute Monocytes: 686 cells/uL (ref 200–950)
Basophils Absolute: 49 cells/uL (ref 0–200)
Basophils Relative: 0.9 %
Eosinophils Absolute: 70 cells/uL (ref 15–500)
Eosinophils Relative: 1.3 %
HCT: 41.6 % (ref 38.5–50.0)
Hemoglobin: 13.8 g/dL (ref 13.2–17.1)
Lymphs Abs: 1026 cells/uL (ref 850–3900)
MCH: 32.1 pg (ref 27.0–33.0)
MCHC: 33.2 g/dL (ref 32.0–36.0)
MCV: 96.7 fL (ref 80.0–100.0)
MPV: 10.7 fL (ref 7.5–12.5)
Monocytes Relative: 12.7 %
Neutro Abs: 3569 cells/uL (ref 1500–7800)
Neutrophils Relative %: 66.1 %
Platelets: 235 10*3/uL (ref 140–400)
RBC: 4.3 10*6/uL (ref 4.20–5.80)
RDW: 13.5 % (ref 11.0–15.0)
Total Lymphocyte: 19 %
WBC: 5.4 10*3/uL (ref 3.8–10.8)

## 2020-12-06 LAB — COMPLETE METABOLIC PANEL WITH GFR
AG Ratio: 2.2 (calc) (ref 1.0–2.5)
ALT: 33 U/L (ref 9–46)
AST: 32 U/L (ref 10–35)
Albumin: 4.8 g/dL (ref 3.6–5.1)
Alkaline phosphatase (APISO): 86 U/L (ref 35–144)
BUN: 16 mg/dL (ref 7–25)
CO2: 27 mmol/L (ref 20–32)
Calcium: 9.8 mg/dL (ref 8.6–10.3)
Chloride: 104 mmol/L (ref 98–110)
Creat: 1.24 mg/dL (ref 0.70–1.25)
GFR, Est African American: 72 mL/min/{1.73_m2} (ref >=60)
GFR, Est Non African American: 62 mL/min/{1.73_m2} (ref >=60)
Globulin: 2.2 g/dL (calc) (ref 1.9–3.7)
Glucose, Bld: 81 mg/dL (ref 65–99)
Potassium: 4.5 mmol/L (ref 3.5–5.3)
Sodium: 141 mmol/L (ref 135–146)
Total Bilirubin: 0.7 mg/dL (ref 0.2–1.2)
Total Protein: 7 g/dL (ref 6.1–8.1)

## 2020-12-06 LAB — LIPID PANEL
Cholesterol: 137 mg/dL (ref <200)
HDL: 47 mg/dL (ref >=40)
LDL Cholesterol (Calc): 75 mg/dL (calc)
Non-HDL Cholesterol (Calc): 90 mg/dL (calc) (ref <130)
Total CHOL/HDL Ratio: 2.9 (calc) (ref <5.0)
Triglycerides: 70 mg/dL (ref <150)

## 2020-12-06 LAB — TSH: TSH: 1.71 mIU/L (ref 0.40–4.50)

## 2020-12-06 LAB — MAGNESIUM: Magnesium: 2.3 mg/dL (ref 1.5–2.5)

## 2020-12-06 MED ORDER — AZELASTINE HCL 0.1 % NA SOLN
2.0000 | Freq: Two times a day (BID) | NASAL | 2 refills | Status: DC
Start: 2020-12-06 — End: 2021-01-19

## 2020-12-06 NOTE — Patient Instructions (Signed)
    Try voltaren gel, asperecreme for mild hand pain - can do 3-4 times a day

## 2020-12-09 ENCOUNTER — Other Ambulatory Visit: Payer: Self-pay | Admitting: Cardiovascular Disease

## 2020-12-14 ENCOUNTER — Ambulatory Visit: Payer: Medicare Other | Admitting: Adult Health Nurse Practitioner

## 2020-12-14 ENCOUNTER — Other Ambulatory Visit: Payer: Self-pay

## 2020-12-14 ENCOUNTER — Encounter: Payer: Self-pay | Admitting: Adult Health Nurse Practitioner

## 2020-12-14 DIAGNOSIS — J40 Bronchitis, not specified as acute or chronic: Secondary | ICD-10-CM

## 2020-12-14 DIAGNOSIS — U071 COVID-19: Secondary | ICD-10-CM | POA: Diagnosis not present

## 2020-12-14 DIAGNOSIS — R0981 Nasal congestion: Secondary | ICD-10-CM

## 2020-12-14 DIAGNOSIS — D849 Immunodeficiency, unspecified: Secondary | ICD-10-CM

## 2020-12-14 DIAGNOSIS — R059 Cough, unspecified: Secondary | ICD-10-CM

## 2020-12-14 MED ORDER — DEXAMETHASONE 4 MG PO TABS: ORAL_TABLET | ORAL | 1 refills | Status: DC

## 2020-12-14 MED ORDER — DOXYCYCLINE HYCLATE 100 MG PO TABS: 100.0000 mg | ORAL_TABLET | Freq: Two times a day (BID) | ORAL | 0 refills | Status: AC

## 2020-12-14 NOTE — Progress Notes (Addendum)
Virtual Visit via Telephone Note  I connected with Edwin Webster on 12/14/20 at  9:30 AM EST by telephone and verified that I am speaking with the correct person using two identifiers.   I discussed the limitations, risks, security and privacy concerns of performing an evaluation and management service by telephone and the availability of in person appointments. I also discussed with the patient that there may be a patient responsible charge related to this service. The patient expressed understanding and agreed to proceed.   Assessment and Plan: Edwin Webster was seen today for covid positive and chills.  Diagnoses and all orders for this visit:  Bronchitis Rx Doxycycline 100mg  BID x7days Rx Dexamethasone per instructions Take with food to avoid stomach upset.  Sinus congestion Discussed Flonase  Cough Promethazine cough syrup PRN Warm steamy shower, vapor rub to help lossen chest Continue mucinex  COVID-19 in immunocompromised patient ([place]) Waldenstrom's Macroglobulinemia     Follow Up Instructions:    I discussed the assessment and treatment plan with the patient. The patient was provided an opportunity to ask questions and all were answered. The patient agreed with the plan and demonstrated an understanding of the instructions.   The patient was advised to call back or seek an in-person evaluation if the symptoms worsen or if the condition fails to improve as anticipated.  I provided 20 minutes of non-face-to-face time during this encounter including counseling, chart review, and critical decision making was preformed.   Future Appointments  Date Time Provider [place]  01/19/2021 10:00 AM [facility] LAB [facility] None  01/19/2021 10:30 AM Ennever, Rudell Cobb, MD [facility] None  02/09/2021  7:10 AM [facility] DEVICE REMOTES [facility]  03/14/2021 11:30 AM Unk Pinto, MD [facility] None  05/11/2021  7:10 AM [facility] DEVICE REMOTES [facility]   08/10/2021  7:10 AM [facility] DEVICE REMOTES [facility]  09/18/2021  2:00 PM Unk Pinto, MD [facility] None  11/09/2021  7:10 AM [facility] DEVICE REMOTES [facility]  12/06/2021 11:00 AM Liane Comber, NP [facility] None      History of Present Illness:  60 y.o. male presents for follow up for symptoms that started 12/09/20, COVID19 test that was positive on 12/12/20.  He reports he is breathing well.  He has a lot of drainage to the back of his throat.  He has a lot of chest congestion with a dry cough.  Reports he is not able to cough anything up.  He is taking Mucinex Q12, Promethazine that has not helping.  He has been taking levocitirazine daily along with levocitirazine Coricidin  (Dextromorphan, Hydrodrondine, guaifenesin) Medical History:  Past Medical History:  Diagnosis Date  . Allergy   . Anemia   . Anxiety   . Arthritis   . Chest pain, non-cardiac   . COPD (chronic obstructive pulmonary disease) ([place])   . Counseling regarding goals of care 03/27/2018  . Depression   . Diverticulitis   . Diverticulosis   . Fractured pelvis ([place])    fall from ladder  . GERD (gastroesophageal reflux disease)   . Gout   . History of shingles   . Hyperlipidemia   . Internal hemorrhoids   . Iron deficiency anemia 03/13/2018  . Kidney stones   . Other abnormal glucose 10/11/2015  . Rheumatoid arthritis ([place])    hands  . Waldenstrom's macroglobulinemia ([place]) 03/27/2018    Current Medications:  Current Outpatient Medications on File Prior to Visit  Medication Sig  . aspirin EC 81 MG tablet Take 81  mg daily by mouth.  Marland Kitchen azelastine (ASTELIN) 0.1 % nasal spray Place 2 sprays into both nostrils 2 (two) times daily. Use in each nostril as directed  . azithromycin (ZITHROMAX) 250 MG tablet TAKE 2 TABLETS BY MOUTH TODAY, THEN TAKE 1 TABLET DAILY FOR 4 DAYS  . busPIRone (BUSPAR) 5 MG tablet Take 1/2 to 1 tablet 3 x /day for Anxiety  . Cholecalciferol (VITAMIN  D-3) 5000 UNITS TABS Take 5,000 Units by mouth 2 (two) times daily.   Marland Kitchen escitalopram (LEXAPRO) 20 MG tablet TAKE 1 TABLET BY MOUTH DAILY FOR MOOD  . ezetimibe (ZETIA) 10 MG tablet TAKE 1 TABLET BY MOUTH EVERY DAY  . famciclovir (FAMVIR) 500 MG tablet TAKE 1 TABLET BY MOUTH EVERY DAY  . famotidine (PEPCID) 40 MG tablet Take 1 tabet Daily for Heartburn and Indigestion  . fluticasone (FLONASE) 50 MCG/ACT nasal spray INSTILL 2 SPRAYS INTO EACH NOSTRIL DAILY AT BEDTIME  . folic acid (FOLVITE) 1 MG tablet Take      1 tablet       Daily  . ibuprofen (ADVIL,MOTRIN) 800 MG tablet Take 1 tablet (800 mg total) by mouth daily as needed.  . IMBRUVICA 280 MG TABS TAKE 1 TABLET (280 MG) BY MOUTH DAILY.  Marland Kitchen levocetirizine (XYZAL) 5 MG tablet TAKE 1 TABLET BY MOUTH DAILY FOR ALLERGIES  . olmesartan (BENICAR) 40 MG tablet Take 40 mg by mouth at bedtime. For BP  . promethazine-dextromethorphan (PROMETHAZINE-DM) 6.25-15 MG/5ML syrup Take 5 mLs by mouth 4 (four) times daily as needed for cough.  . rosuvastatin (CRESTOR) 40 MG tablet Take 1 tablet Daily for Cholesterol  . vitamin B-12 (CYANOCOBALAMIN) 500 MCG tablet Take 500 mcg by mouth daily.   No current facility-administered medications on file prior to visit.    Allergies:  Allergies  Allergen Reactions  . Adalimumab Other (See Comments)    "blacked out", confused       Review of Systems:  ROS   Observations/Objective: General : Well sounding patient in no apparent distress HEENT: no hoarseness, no cough for duration of visit Lungs: speaks in complete sentences, no audible wheezing, no apparent distress Neurological: alert, oriented x 3 Psychiatric: pleasant, judgement appropriate      Garnet Sierras, NP [facility] 12/14/2020  10:14 AM ~

## 2020-12-14 NOTE — Patient Instructions (Addendum)
  Today you had a telephone visit with Garnet Sierras, DNP.  Below is a summary of your visit.  We have sent in Doxycycline, take one tablet twice a day (See you recently had azithromyacin)  A Dexamethasone steroid taper has been sent to the pharmacy for you to help reduce inflammation.  Start taking this if your symptoms worsen.   At the pharmacy purchase and Oxygen sensor.  Goal is 93% or higher.  Make sure your finger is warm.  Nail polish may alter readings.  IF reading is less that 93% take a few deep breaths in through your nose and out your mouth to increase the readings.  **IF you are less than 90% and short of breath please SEEK IMMEDIATE ATTENTION via 911.**  Chest Congestion: Continue Taking the Mucinex DM every 12 hours.  You can try humidification or Warm steamy shower to help loosen chest.  Cough: You may take the Delsym cough syrup, be mindful of timing as there is a small amount of this in the Mucinex DM.  Sore throat For your sore throat, gargle salt water and spit out.  Throat lozengers or chloraseptic spray.  Runny nose, ear fullness Try to change antihistamine from Levocitrizine to Cetirizine (Zyrtec) every night to help dry up any fluid draining in back of your throat.  IF this does not work try Human resources officer (Fexofenadine).  Use the antihistamine that best gives you the drying effect.   Nasal Congestion: Take Sudafed as directed.  If you take blood pressure medication, monitor your blood pressure as it can increase if taking this medication.    Body Aches:  You can take Acetaminophen (Tylenol-focuses on pain fever reduction) 1,000mg  every 8 hours alternating with Ibuprofen (Advil-focuses on inflammation, pain, fever) 600mg  every 6hours OR 800mg  every 8hours.   Continue to take Vitamin D supplement to help your immune system.  Stay hydrated drinking lots of water or some Gatorade.  IF your taste and smell is absent, be mindful of eating salty food or adding salt  to foods.  You will be able to taste salt OR the first to return so be careful as this can cause you to retain fluid and increase blood pressure.   Please let us know if you have any new or worsening symptoms or need any help with symptoms management.   Below is summary of CDC guidance for testing positive and for exposure.        Garnet Sierras, Laqueta Jean, DNP [facility] 12/14/2020  10:25 AM

## 2020-12-17 ENCOUNTER — Other Ambulatory Visit: Payer: Self-pay | Admitting: Internal Medicine

## 2020-12-27 MED FILL — IMBRUVICA 280 MG TAB: 280 | 28 days supply | Qty: 28 | Fill #1

## 2020-12-28 ENCOUNTER — Encounter: Payer: Self-pay | Admitting: Adult Health

## 2021-01-19 ENCOUNTER — Inpatient Hospital Stay: Payer: Medicare Other | Attending: Hematology & Oncology

## 2021-01-19 ENCOUNTER — Inpatient Hospital Stay (HOSPITAL_BASED_OUTPATIENT_CLINIC_OR_DEPARTMENT_OTHER): Payer: Medicare Other | Admitting: Hematology & Oncology

## 2021-01-19 ENCOUNTER — Telehealth: Payer: Self-pay

## 2021-01-19 ENCOUNTER — Other Ambulatory Visit: Payer: Self-pay

## 2021-01-19 ENCOUNTER — Encounter: Payer: Self-pay | Admitting: Hematology & Oncology

## 2021-01-19 VITALS — BP 131/77 | HR 71 | Temp 98.3°F | Resp 20 | Wt 177.8 lb

## 2021-01-19 DIAGNOSIS — D500 Iron deficiency anemia secondary to blood loss (chronic): Secondary | ICD-10-CM

## 2021-01-19 DIAGNOSIS — D509 Iron deficiency anemia, unspecified: Secondary | ICD-10-CM | POA: Insufficient documentation

## 2021-01-19 DIAGNOSIS — C8800 Waldenstrom macroglobulinemia not having achieved remission: Secondary | ICD-10-CM

## 2021-01-19 LAB — CBC WITH DIFFERENTIAL (CANCER CENTER ONLY)
Abs Immature Granulocytes: 0.02 10*3/uL (ref 0.00–0.07)
Basophils Absolute: 0 10*3/uL (ref 0.0–0.1)
Basophils Relative: 1 %
Eosinophils Absolute: 0 10*3/uL (ref 0.0–0.5)
Eosinophils Relative: 1 %
HCT: 35.9 % — ABNORMAL LOW (ref 39.0–52.0)
Hemoglobin: 12.3 g/dL — ABNORMAL LOW (ref 13.0–17.0)
Immature Granulocytes: 1 %
Lymphocytes Relative: 22 %
Lymphs Abs: 0.7 10*3/uL (ref 0.7–4.0)
MCH: 32.5 pg (ref 26.0–34.0)
MCHC: 34.3 g/dL (ref 30.0–36.0)
MCV: 94.7 fL (ref 80.0–100.0)
Monocytes Absolute: 0.4 10*3/uL (ref 0.1–1.0)
Monocytes Relative: 15 %
Neutro Abs: 1.8 10*3/uL (ref 1.7–7.7)
Neutrophils Relative %: 60 %
Platelet Count: 228 10*3/uL (ref 150–400)
RBC: 3.79 MIL/uL — ABNORMAL LOW (ref 4.22–5.81)
RDW: 13.9 % (ref 11.5–15.5)
WBC Count: 3 10*3/uL — ABNORMAL LOW (ref 4.0–10.5)
nRBC: 0 % (ref 0.0–0.2)

## 2021-01-19 LAB — CMP (CANCER CENTER ONLY)
ALT: 12 U/L (ref 0–44)
AST: 19 U/L (ref 15–41)
Albumin: 4 g/dL (ref 3.5–5.0)
Alkaline Phosphatase: 63 U/L (ref 38–126)
Anion gap: 6 (ref 5–15)
BUN: 13 mg/dL (ref 8–23)
CO2: 26 mmol/L (ref 22–32)
Calcium: 9.4 mg/dL (ref 8.9–10.3)
Chloride: 106 mmol/L (ref 98–111)
Creatinine: 1.19 mg/dL (ref 0.61–1.24)
GFR, Estimated: >60 mL/min (ref >60)
Glucose, Bld: 118 mg/dL — ABNORMAL HIGH (ref 70–99)
Potassium: 3.9 mmol/L (ref 3.5–5.1)
Sodium: 138 mmol/L (ref 135–145)
Total Bilirubin: 0.6 mg/dL (ref 0.3–1.2)
Total Protein: 6 g/dL — ABNORMAL LOW (ref 6.5–8.1)

## 2021-01-19 LAB — LACTATE DEHYDROGENASE: LDH: 174 U/L (ref 98–192)

## 2021-01-19 NOTE — Telephone Encounter (Signed)
appts made and printed for pt per los   Edwin Webster 

## 2021-01-19 NOTE — Progress Notes (Signed)
Hematology and Oncology Follow Up Visit  Edwin Webster [id number] 12-02-1960 60 y.o. 01/19/2021   Principle Diagnosis:  Waldenstrom's macroglobulinemia Iron deficiency anemia  Current Therapy:   Rituxan/bendamustine-s/p cycle 2 -- d/c on 06/11/2018 Imbruvica  280 mg po q day -- changed on 08/06/2019 IV iron as indicated - last received in April 2019   Interim History:  [name] is here today for follow-up.  Overall, he is doing okay.  Does feel tired.  His iron might be on the lower side.  When we last saw him, his ferritin was 58 with an iron saturation of 25%.  As far as the Joanne Chars is concerned, his M spike more or less on was 0.4 g/dL.  The IgM level was 450 mg/dL.  The Kappa light chain was actually up at 3.7 mg/dL.  He is doing well with the pacemaker.  He has had no problems with the pacemaker.  His rhythm is monitored nightly.  He is okay on the [place].  He has had no issues with the Imbruvica.  There is been no nausea or vomiting.  He has had no diarrhea.  He has had no rashes.  He has had no headache.  He has avoided the coronavirus.  Overall, his performance status is ECOG 1.    Medications:  Allergies as of 01/19/2021      Reactions   Adalimumab Other (See Comments)   "blacked out", confused      Medication List       Accurate as of January 19, 2021 11:47 AM. If you have any questions, ask your nurse or doctor.        STOP taking these medications   azelastine 0.1 % nasal spray Commonly known as: ASTELIN Stopped by: [name], MD   azithromycin 250 MG tablet Commonly known as: ZITHROMAX Stopped by: [name], MD   dexamethasone 4 MG tablet Commonly known as: DECADRON Stopped by: [name], MD   promethazine-dextromethorphan 6.25-15 MG/5ML syrup Commonly known as: PROMETHAZINE-DM Stopped by: [name], MD     TAKE these medications   aspirin EC 81 MG tablet Take 81 mg daily by mouth.   busPIRone 5 MG  tablet Commonly known as: BUSPAR Take 1/2 to 1 tablet 3 x /day for Anxiety   escitalopram 20 MG tablet Commonly known as: LEXAPRO TAKE 1 TABLET BY MOUTH DAILY FOR MOOD   ezetimibe 10 MG tablet Commonly known as: ZETIA TAKE 1 TABLET BY MOUTH EVERY DAY   famciclovir 500 MG tablet Commonly known as: FAMVIR TAKE 1 TABLET BY MOUTH EVERY DAY   famotidine 40 MG tablet Commonly known as: PEPCID Take 1 tabet Daily for Heartburn and Indigestion   fluticasone 50 MCG/ACT nasal spray Commonly known as: FLONASE INSTILL 2 SPRAYS INTO EACH NOSTRIL DAILY AT BEDTIME   folic acid 1 MG tablet Commonly known as: FOLVITE Take   1 tablet   Daily   ibuprofen 800 MG tablet Commonly known as: Motrin IB Take 1 tablet (800 mg total) by mouth daily as needed.   Imbruvica 280 MG Tabs Generic drug: ibrutinib TAKE 1 TABLET (280 MG) BY MOUTH DAILY.   levocetirizine 5 MG tablet Commonly known as: XYZAL TAKE 1 TABLET BY MOUTH DAILY FOR ALLERGIES   olmesartan 40 MG tablet Commonly known as: BENICAR Take 40 mg by mouth at bedtime. For BP   rosuvastatin 40 MG tablet Commonly known as: CRESTOR Take 1 tablet Daily for Cholesterol   vitamin B-12 500  MCG tablet Commonly known as: CYANOCOBALAMIN Take 500 mcg by mouth daily.   Vitamin D-3 125 MCG (5000 UT) Tabs Take 5,000 Units by mouth 2 (two) times daily.       Allergies:  Allergies  Allergen Reactions   Adalimumab Other (See Comments)    "blacked out", confused     Past Medical History, Surgical history, Social history, and Family History were reviewed and updated.  Review of Systems: Review of Systems  Constitutional: Negative.   HENT: Negative.   Eyes: Negative.   Respiratory: Negative.   Cardiovascular: Negative.   Gastrointestinal: Negative.   Genitourinary: Negative.   Musculoskeletal: Negative.   Skin: Negative.   Neurological: Negative.   Endo/Heme/Allergies: Negative.   Psychiatric/Behavioral: Negative.        Physical Exam:  weight is 177 lb 12.8 oz (80.6 kg). His oral temperature is 98.3 F (36.8 C). His blood pressure is 131/77 and his pulse is 71. His respiration is 20 and oxygen saturation is 99%.   Wt Readings from Last 3 Encounters:  01/19/21 177 lb 12.8 oz (80.6 kg)  12/06/20 181 lb (82.1 kg)  11/24/20 179 lb 6.4 oz (81.4 kg)  Well then  Physical Exam Vitals reviewed.  HENT:     Head: Normocephalic and atraumatic.  Eyes:     Pupils: Pupils are equal, round, and reactive to light.  Cardiovascular:     Rate and Rhythm: Normal rate and regular rhythm.     Heart sounds: Normal heart sounds.  Pulmonary:     Effort: Pulmonary effort is normal.     Breath sounds: Normal breath sounds.  Abdominal:     General: Bowel sounds are normal.     Palpations: Abdomen is soft.  Musculoskeletal:        General: No tenderness or deformity. Normal range of motion.     Cervical back: Normal range of motion.  Lymphadenopathy:     Cervical: No cervical adenopathy.  Skin:    General: Skin is warm and dry.     Findings: No erythema or rash.  Neurological:     Mental Status: He is alert and oriented to person, place, and time.  Psychiatric:        Behavior: Behavior normal.        Thought Content: Thought content normal.        Judgment: Judgment normal.      Lab Results  Component Value Date   WBC 3.0 (L) 01/19/2021   HGB 12.3 (L) 01/19/2021   HCT 35.9 (L) 01/19/2021   MCV 94.7 01/19/2021   PLT 228 01/19/2021   Lab Results  Component Value Date   FERRITIN 58 06/22/2020   IRON 94 09/01/2020   TIBC 379 09/01/2020   UIBC 251 06/22/2020   IRONPCTSAT 25 09/01/2020   Lab Results  Component Value Date   RETICCTPCT 0.9 02/12/2018   RBC 3.79 (L) 01/19/2021   RETICCTABS 33,120 02/12/2018   Lab Results  Component Value Date   KPAFRELGTCHN 36.8 (H) 09/28/2020   LAMBDASER 13.9 09/28/2020   KAPLAMBRATIO 2.65 (H) 09/28/2020   Lab Results  Component Value Date   IGGSERUM 458 (L)  09/28/2020   IGA 44 (L) 09/28/2020   IGMSERUM 445 (H) 09/28/2020   Lab Results  Component Value Date   TOTALPROTELP 6.3 09/28/2020   ALBUMINELP 3.8 09/28/2020   A1GS 0.3 09/28/2020   A2GS 0.7 09/28/2020   BETS 0.8 09/28/2020   BETA2SER 0.2 02/18/2018   GAMS 0.7 09/28/2020   MSPIKE  0.4 (H) 09/28/2020   SPEI Comment 07/09/2018     Chemistry      Component Value Date/Time   NA 138 01/19/2021 1002   NA 141 08/04/2020 1203   K 3.9 01/19/2021 1002   CL 106 01/19/2021 1002   CO2 26 01/19/2021 1002   BUN 13 01/19/2021 1002   BUN 13 08/04/2020 1203   CREATININE 1.19 01/19/2021 1002   CREATININE 1.24 12/06/2020 1159      Component Value Date/Time   CALCIUM 9.4 01/19/2021 1002   ALKPHOS 63 01/19/2021 1002   AST 19 01/19/2021 1002   ALT 12 01/19/2021 1002   BILITOT 0.6 01/19/2021 1002      Impression and Plan: Mr. Breach is a very pleasant 60 yo caucasian gentleman with Waldenstrom's macroglobulinemia.   Right now, I would not make any changes with the Imbruvica dose.  We will see what the monoclonal studies show.  I would like to hope that his M spike will be stable.  We will see what his iron studies look like.  I know this will be important.  I would like to get him back in 3 months.      [name], MD 2/24/202211:47 AM

## 2021-01-20 LAB — IRON AND TIBC
Iron: 59 ug/dL (ref 42–163)
Saturation Ratios: 18 % — ABNORMAL LOW (ref 20–55)
TIBC: 324 ug/dL (ref 202–409)
UIBC: 265 ug/dL (ref 117–376)

## 2021-01-20 LAB — KAPPA/LAMBDA LIGHT CHAINS
Kappa free light chain: 24.4 mg/L — ABNORMAL HIGH (ref 3.3–19.4)
Kappa, lambda light chain ratio: 1.97 — ABNORMAL HIGH (ref 0.26–1.65)
Lambda free light chains: 12.4 mg/L (ref 5.7–26.3)

## 2021-01-20 LAB — IGG, IGA, IGM
IgA: 39 mg/dL — ABNORMAL LOW (ref 61–437)
IgG (Immunoglobin G), Serum: 459 mg/dL — ABNORMAL LOW (ref 603–1613)
IgM (Immunoglobulin M), Srm: 422 mg/dL — ABNORMAL HIGH (ref 20–172)

## 2021-01-20 LAB — FERRITIN: Ferritin: 40 ng/mL (ref 24–336)

## 2021-01-24 ENCOUNTER — Inpatient Hospital Stay: Payer: Medicare Other | Attending: Hematology & Oncology

## 2021-01-24 ENCOUNTER — Other Ambulatory Visit: Payer: Self-pay

## 2021-01-24 VITALS — BP 114/74 | HR 60 | Temp 97.8°F | Resp 18

## 2021-01-24 DIAGNOSIS — D509 Iron deficiency anemia, unspecified: Secondary | ICD-10-CM | POA: Insufficient documentation

## 2021-01-24 DIAGNOSIS — D500 Iron deficiency anemia secondary to blood loss (chronic): Secondary | ICD-10-CM

## 2021-01-24 DIAGNOSIS — C880 Waldenstrom macroglobulinemia: Secondary | ICD-10-CM | POA: Insufficient documentation

## 2021-01-24 LAB — IMMUNOFIXATION REFLEX, SERUM
IgA: 40 mg/dL — ABNORMAL LOW (ref 61–437)
IgG (Immunoglobin G), Serum: 496 mg/dL — ABNORMAL LOW (ref 603–1613)
IgM (Immunoglobulin M), Srm: 441 mg/dL — ABNORMAL HIGH (ref 20–172)

## 2021-01-24 LAB — PROTEIN ELECTROPHORESIS, SERUM, WITH REFLEX
A/G Ratio: 1.8 — ABNORMAL HIGH (ref 0.7–1.7)
Albumin ELP: 3.7 g/dL (ref 2.9–4.4)
Alpha-1-Globulin: 0.2 g/dL (ref 0.0–0.4)
Alpha-2-Globulin: 0.6 g/dL (ref 0.4–1.0)
Beta Globulin: 0.8 g/dL (ref 0.7–1.3)
Gamma Globulin: 0.6 g/dL (ref 0.4–1.8)
Globulin, Total: 2.1 g/dL — ABNORMAL LOW (ref 2.2–3.9)
M-Spike, %: 0.2 g/dL — ABNORMAL HIGH
SPEP Interpretation: 0
Total Protein ELP: 5.8 g/dL — ABNORMAL LOW (ref 6.0–8.5)

## 2021-01-24 MED ORDER — SODIUM CHLORIDE 0.9 % IV SOLN
1000.0000 mg | Freq: Once | INTRAVENOUS | Status: AC
  Administered 2021-01-24: 1000 mg via INTRAVENOUS
  Filled 2021-01-24: qty 10

## 2021-01-24 MED ORDER — SODIUM CHLORIDE 0.9 % IV SOLN
Freq: Once | INTRAVENOUS | Status: AC
  Filled 2021-01-24: qty 250

## 2021-01-24 MED FILL — IMBRUVICA 280 MG TAB: 280 | 28 days supply | Qty: 28 | Fill #2

## 2021-01-24 NOTE — Patient Instructions (Signed)
Ferric Derisomaltose injection What is this medicine? FERRIC DERISOMALTOSE (ferr-ik der eye soe mawl tose) is an iron complex. Iron is used to make healthy red blood cells, which carry oxygen and nutrients through the body. This medicine is used to treat people with low levels of iron in the blood and have kidney disease or cannot take iron by mouth. This medicine may be used for other purposes; ask your health care provider or pharmacist if you have questions. COMMON BRAND NAME(S): MONOFERRIC What should I tell my health care provider before I take this medicine? They need to know if you have any of these conditions:  high levels of iron in the blood  an unusual or allergic reaction to iron, other medicines, foods, dyes, or preservatives  pregnant or trying to get pregnant  breast-feeding How should I use this medicine? This medicine is for injection into a vein. It is given by a health care professional in a hospital or clinic setting. Talk to your pediatrician regarding the use of this medicine in children. Special care may be needed. Overdosage: If you think you have taken too much of this medicine contact a poison control center or emergency room at once. NOTE: This medicine is only for you. Do not share this medicine with others. What if I miss a dose? It is important not to miss your dose. Call your doctor or health care professional if you are unable to keep an appointment. What may interact with this medicine? Do not take this medicine with any of the following medications:  deferoxamine  dimercaprol  other iron products This list may not describe all possible interactions. Give your health care provider a list of all the medicines, herbs, non-prescription drugs, or dietary supplements you use. Also tell them if you smoke, drink alcohol, or use illegal drugs. Some items may interact with your medicine. What should I watch for while using this medicine? Visit your doctor or  health care professional regularly. Tell your doctor if your symptoms do not start to get better or if they get worse. You may need blood work done while you are taking this medicine. You may need to follow a special diet. Talk to your doctor. Foods that contain iron include whole grains/cereals, dried fruits, beans, or peas, leafy green vegetables, and organ meats (liver, kidney). What side effects may I notice from receiving this medicine? Side effects that you should report to your doctor or health care professional as soon as possible:  allergic reactions like skin rash, itching or hives, swelling of the face, lips, or tongue Side effects that usually do not require medical attention (report these to your doctor or health care professional if they continue or are bothersome):  constipation  nausea, vomiting  pain, redness, or irritation at site where injected This list may not describe all possible side effects. Call your doctor for medical advice about side effects. You may report side effects to FDA at 1-800-FDA-1088. Where should I keep my medicine? This drug is given in a hospital or clinic and will not be stored at home. NOTE: This sheet is a summary. It may not cover all possible information. If you have questions about this medicine, talk to your doctor, pharmacist, or health care provider.  2021 Elsevier/Gold Standard (2019-07-22 15:54:40)  

## 2021-01-24 NOTE — Progress Notes (Signed)
Pt. refused to wait 30 min post infusion. Stable and ASX upon release.

## 2021-01-25 ENCOUNTER — Encounter: Payer: Self-pay | Admitting: *Deleted

## 2021-02-09 ENCOUNTER — Ambulatory Visit (INDEPENDENT_AMBULATORY_CARE_PROVIDER_SITE_OTHER): Payer: Medicare Other

## 2021-02-09 DIAGNOSIS — I495 Sick sinus syndrome: Secondary | ICD-10-CM

## 2021-02-09 LAB — CUP PACEART REMOTE DEVICE CHECK
Battery Remaining Longevity: 125 mo
Battery Remaining Percentage: 95.5 %
Battery Voltage: 3.02 V
Brady Statistic AP VP Percent: 1 %
Brady Statistic AP VS Percent: 65 %
Brady Statistic AS VP Percent: 1 %
Brady Statistic AS VS Percent: 34 %
Brady Statistic RA Percent Paced: 65 %
Brady Statistic RV Percent Paced: 1 %
Date Time Interrogation Session: 20220317020012
Implantable Lead Implant Date: 20210915
Implantable Lead Implant Date: 20210915
Implantable Lead Location: 753859
Implantable Lead Location: 753860
Implantable Pulse Generator Implant Date: 20210915
Lead Channel Impedance Value: 430 Ohm
Lead Channel Impedance Value: 540 Ohm
Lead Channel Pacing Threshold Amplitude: 0.625 V
Lead Channel Pacing Threshold Amplitude: 0.75 V
Lead Channel Pacing Threshold Pulse Width: 0.5 ms
Lead Channel Pacing Threshold Pulse Width: 0.5 ms
Lead Channel Sensing Intrinsic Amplitude: 11.4 mV
Lead Channel Sensing Intrinsic Amplitude: 3.5 mV
Lead Channel Setting Pacing Amplitude: 1 V
Lead Channel Setting Pacing Amplitude: 1.625
Lead Channel Setting Pacing Pulse Width: 0.5 ms
Lead Channel Setting Sensing Sensitivity: 2 mV
Pulse Gen Model: 2272
Pulse Gen Serial Number: 3864545

## 2021-02-13 ENCOUNTER — Other Ambulatory Visit: Payer: Self-pay

## 2021-02-13 ENCOUNTER — Ambulatory Visit (AMBULATORY_SURGERY_CENTER): Payer: Self-pay

## 2021-02-13 VITALS — Ht 70.0 in | Wt 175.0 lb

## 2021-02-13 DIAGNOSIS — Z1211 Encounter for screening for malignant neoplasm of colon: Secondary | ICD-10-CM

## 2021-02-13 MED ORDER — NA SULFATE-K SULFATE-MG SULF 17.5-3.13-1.6 GM/177ML PO SOLN: 1.0000 | Freq: Once | ORAL | 0 refills | Status: AC

## 2021-02-13 NOTE — Progress Notes (Signed)
No egg or soy allergy known to patient  No issues with past sedation with any surgeries or procedures Patient denies ever being told they had issues or difficulty with intubation  No FH of Malignant Hyperthermia No diet pills per patient No home 02 use per patient  No blood thinners per patient  Pt denies issues with constipation at this time- encouraged increase fluids and Miralax 5 days prior to procedure if constipation occurs No A fib or A flutter  EMMI video via [place] 19 guidelines implemented in PV today with Pt and RN  NO PA's for preps discussed with pt in PV today  Discussed with pt there will be an out-of-pocket cost for prep and that varies from $0 to 70 dollars  Due to the COVID-19 pandemic we are asking patients to follow certain guidelines.   Pt aware of COVID protocols and [facility] guidelines

## 2021-02-15 ENCOUNTER — Telehealth: Payer: Self-pay | Admitting: Internal Medicine

## 2021-02-15 DIAGNOSIS — Z1211 Encounter for screening for malignant neoplasm of colon: Secondary | ICD-10-CM

## 2021-02-15 MED ORDER — PEG 3350-KCL-NA BICARB-NACL 420 G PO SOLR: 4000.0000 mL | Freq: Once | ORAL | 0 refills | Status: AC

## 2021-02-15 NOTE — Telephone Encounter (Signed)
Explained to the pt's wife that we do not do PA's and the dr does not use clenpiq. Golytely Rx sent to pt's pharmacy and new prep instructions sent to pt's Mychart-she is aware and will call us back with questions.

## 2021-02-17 ENCOUNTER — Other Ambulatory Visit (HOSPITAL_COMMUNITY): Payer: Self-pay

## 2021-02-17 NOTE — Progress Notes (Signed)
Remote pacemaker transmission.   

## 2021-02-20 MED FILL — IMBRUVICA 280 MG TAB: 280 | 28 days supply | Qty: 28 | Fill #3

## 2021-02-24 ENCOUNTER — Other Ambulatory Visit: Payer: Self-pay

## 2021-02-24 ENCOUNTER — Ambulatory Visit (AMBULATORY_SURGERY_CENTER): Payer: Medicare Other | Admitting: Internal Medicine

## 2021-02-24 ENCOUNTER — Encounter: Payer: Self-pay | Admitting: Internal Medicine

## 2021-02-24 VITALS — BP 127/64 | HR 61 | Temp 96.3°F | Resp 17 | Ht 70.0 in | Wt 175.0 lb

## 2021-02-24 DIAGNOSIS — Z1211 Encounter for screening for malignant neoplasm of colon: Secondary | ICD-10-CM

## 2021-02-24 DIAGNOSIS — D123 Benign neoplasm of transverse colon: Secondary | ICD-10-CM

## 2021-02-24 MED ORDER — AMBULATORY NON FORMULARY MEDICATION: 0 refills | Status: DC

## 2021-02-24 MED ORDER — SODIUM CHLORIDE 0.9 % IV SOLN: 500.0000 mL | Freq: Once | INTRAVENOUS | Status: DC

## 2021-02-24 NOTE — Progress Notes (Signed)
Called to room to assist during endoscopic procedure.  Patient ID and intended procedure confirmed with present staff. Received instructions for my participation in the procedure from the performing physician.  

## 2021-02-24 NOTE — Patient Instructions (Signed)
YOU HAD AN ENDOSCOPIC PROCEDURE TODAY AT [facility]:   Refer to the procedure report that was given to you for any specific questions about what was found during the examination.  If the procedure report does not answer your questions, please call your gastroenterologist to clarify.  If you requested that your care partner not be given the details of your procedure findings, then the procedure report has been included in a sealed envelope for you to review at your convenience later.  YOU SHOULD EXPECT: Some feelings of bloating in the abdomen. Passage of more gas than usual.  Walking can help get rid of the air that was put into your GI tract during the procedure and reduce the bloating. If you had a lower endoscopy (such as a colonoscopy or flexible sigmoidoscopy) you may notice spotting of blood in your stool or on the toilet paper. If you underwent a bowel prep for your procedure, you may not have a normal bowel movement for a few days.  Please Note:  You might notice some irritation and congestion in your nose or some drainage.  This is from the oxygen used during your procedure.  There is no need for concern and it should clear up in a day or so.  SYMPTOMS TO REPORT IMMEDIATELY:   Following lower endoscopy (colonoscopy or flexible sigmoidoscopy):  Excessive amounts of blood in the stool  Significant tenderness or worsening of abdominal pains  Swelling of the abdomen that is new, acute  Fever of 100F or higher   For urgent or emergent issues, a gastroenterologist can be reached at any hour by calling [phone]. Do not use MyChart messaging for urgent concerns.    DIET:  We do recommend a small meal at first, but then you may proceed to your regular diet.  Drink plenty of fluids but you should avoid alcoholic beverages for 24 hours.  MEDICATIONS: Continue present medications. Use Mirilax 17g by mouth daily (this is an over the counter medication). Use Diltiazem 2%  gel, a pea-sized amount, per rectum 2-3 times daily for 4-6 weeks (you must pick this prescription up at the [facility]).  FOLLOW UP with Dr. Hilarie Fredrickson in his office for hemorrhoid banding procedure in 6 weeks. Dr. Vena Rua office nurse will call you to schedule this appointment.  Please see handouts given to you by your recovery nurse.  Thank you for allowing [place] to provide for your healthcare needs today.  ACTIVITY:  You should plan to take it easy for the rest of today and you should NOT DRIVE or use heavy machinery until tomorrow (because of the sedation medicines used during the test).    FOLLOW UP: Our staff will call the number listed on your records 48-72 hours following your procedure to check on you and address any questions or concerns that you may have regarding the information given to you following your procedure. If we do not reach you, we will leave a message.  We will attempt to reach you two times.  During this call, we will ask if you have developed any symptoms of COVID 19. If you develop any symptoms (ie: fever, flu-like symptoms, shortness of breath, cough etc.) before then, please call [phone].  If you test positive for Covid 19 in the 2 weeks post procedure, please call and report this information to [place].    If any biopsies were taken you will be contacted by phone or by letter within the next 1-3 weeks.  Please call us at [phone] if you have not heard about the biopsies in 3 weeks.    SIGNATURES/CONFIDENTIALITY: You and/or your care partner have signed paperwork which will be entered into your electronic medical record.  These signatures attest to the fact that that the information above on your After Visit Summary has been reviewed and is understood.  Full responsibility of the confidentiality of this discharge information lies with you and/or your care-partner.

## 2021-02-24 NOTE — Progress Notes (Signed)
VS by [place]  Pt's states no medical or surgical changes since previsit or office visit.  

## 2021-02-24 NOTE — Progress Notes (Signed)
A and O x3. Report to RN. Tolerated MAC anesthesia well.

## 2021-02-24 NOTE — Op Note (Signed)
[name] Procedure Date: 02/24/2021 11:23 AM MRN: [id number] Endoscopist: Jerene Bears , MD Age: 60 Referring MD:  Date of Birth: [birth year] Gender: Male Account #: [id number] Procedure:                Colonoscopy Indications:              Surveillance: Personal history of adenomatous                            polyps (x 3 in 2013), Last colonoscopy: November                            2016 Medicines:                Monitored Anesthesia Care Procedure:                Pre-Anesthesia Assessment:                           - Prior to the procedure, a History and Physical                            was performed, and patient medications and                            allergies were reviewed. The patient's tolerance of                            previous anesthesia was also reviewed. The risks                            and benefits of the procedure and the sedation                            options and risks were discussed with the patient.                            All questions were answered, and informed consent                            was obtained. Prior Anticoagulants: The patient has                            taken no previous anticoagulant or antiplatelet                            agents. ASA Grade Assessment: III - A patient with                            severe systemic disease. After reviewing the risks                            and benefits, the patient was deemed in  satisfactory condition to undergo the procedure.                           After obtaining informed consent, the colonoscope                            was passed under direct vision. Throughout the                            procedure, the patient's blood pressure, pulse, and                            oxygen saturations were monitored continuously. The                            Olympus CF-HQ190L (Serial# [id number]) Colonoscope was                             introduced through the anus and advanced to the                            cecum, identified by appendiceal orifice and                            ileocecal valve. The colonoscopy was performed                            without difficulty. The patient tolerated the                            procedure well. The quality of the bowel                            preparation was good. The ileocecal valve,                            appendiceal orifice, and rectum were photographed. Scope In: 11:31:41 AM Scope Out: 11:48:04 AM Scope Withdrawal Time: 0 hours 12 minutes 8 seconds  Total Procedure Duration: 0 hours 16 minutes 23 seconds  Findings:                 The digital rectal exam was normal.                           A 4 mm polyp was found in the distal transverse                            colon. The polyp was sessile. The polyp was removed                            with a cold snare. Resection and retrieval were                            complete.  A single small angioectasia with typical                            arborization was found in the ascending colon.                           Multiple small-mouthed diverticula were found in                            the sigmoid colon and descending colon.                           Internal hemorrhoids were found during retroflexion                            along with a small anal canal skin tag. The                            hemorrhoids were medium-sized. Complications:            No immediate complications. Estimated Blood Loss:     Estimated blood loss: none. Impression:               - One 4 mm polyp in the distal transverse colon,                            removed with a cold snare. Resected and retrieved.                           - A single colonic angioectasia.                           - Diverticulosis in the sigmoid colon and in the                            descending colon.                            - Internal hemorrhoids. Recommendation:           - Patient has a contact number available for                            emergencies. The signs and symptoms of potential                            delayed complications were discussed with the                            patient. Return to normal activities tomorrow.                            Written discharge instructions were provided to the                            patient.                           -  Resume previous diet.                           - Continue present medications.                           - Await pathology results.                           - Repeat colonoscopy is recommended for                            surveillance. The colonoscopy date will be                            determined after pathology results from today's                            exam become available for review. Jerene Bears, MD 02/24/2021 12:01:26 PM This report has been signed electronically.

## 2021-02-27 ENCOUNTER — Other Ambulatory Visit: Payer: Self-pay | Admitting: Adult Health

## 2021-02-28 ENCOUNTER — Other Ambulatory Visit: Payer: Self-pay | Admitting: Adult Health

## 2021-02-28 ENCOUNTER — Other Ambulatory Visit: Payer: Self-pay | Admitting: Internal Medicine

## 2021-02-28 ENCOUNTER — Telehealth: Payer: Self-pay | Admitting: *Deleted

## 2021-02-28 MED ORDER — IPRATROPIUM BROMIDE 0.06 % NA SOLN: NASAL | 3 refills | Status: DC

## 2021-02-28 NOTE — Telephone Encounter (Signed)
Attempted f/u phone call. No answer. Left message. °

## 2021-02-28 NOTE — Telephone Encounter (Signed)
Patient returned call. He stated is doing fine.

## 2021-03-06 ENCOUNTER — Encounter: Payer: Self-pay | Admitting: Internal Medicine

## 2021-03-13 ENCOUNTER — Encounter: Payer: Self-pay | Admitting: Internal Medicine

## 2021-03-13 NOTE — Progress Notes (Signed)
Future Appointments  Date Time Provider [place]  03/14/2021 11:30 AM Unk Pinto, MD [facility] None  04/18/2021 10:30 AM [name], MD [facility] None  09/18/2021  2:00 PM Unk Pinto, MD [facility] None  12/06/2021 11:00 AM Liane Comber, NP [facility] None   History of Present Illness:       This very nice 60 y.o.  MWM presents for 6 month follow up with HTN, HLD, Pre-Diabetes and Vitamin D Deficiency. Patient follows with Dr Marin Olp for [place]  (2019) and on Rituxan/bendamustine.  Patient has hx/o seronegative Rheumatoid arthritis and currently is off of MTX  .        Patient is treated for HTN & BP has been controlled at home. Today's BP: 138/82. 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  Patient has had no complaints of any cardiac type chest pain, palpitations, dyspnea / orthopnea / PND, dizziness, claudication, or dependent edema.       Hyperlipidemia is controlled with diet & Rosuvastatin/Ezetimibe. Patient denies myalgias or other med SE's. Last Lipids were at goal:  Lab Results  Component Value Date   CHOL 137 12/06/2020   HDL 47 12/06/2020   LDLCALC 75 12/06/2020   TRIG 70 12/06/2020   CHOLHDL 2.9 12/06/2020     Also, the patient has been monitored for glucose intolerance. The patient has had no symptoms of reactive hypoglycemia, diabetic polys, paresthesias or visual blurring.  Last A1c was Normal & at goal:  Lab Results  Component Value Date   HGBA1C 5.5 09/01/2020        Further, the patient also has history of Vitamin D Deficiency ("32" /2017)and supplements vitamin D without any suspected side-effects. Last vitamin D was at goal (sl elevated ):  Lab Results  Component Value Date   VD25OH 103 (H) 09/01/2020    Current Outpatient Medications on File Prior to Visit  Medication Sig  . ipratropium (ATROVENT) 0.06 % nasal spray Use 1 to 2 sprays each  nostril 2 to 3 x /day as needed  . Diltiazem 2% gel  apple 1 pea sized amount to anal canal 4-6 times daily  . aspirin EC 81 MG tablet Take  daily .  . busPIRone (BUSPAR) 5 MG tablet Take 1/2 to 1 tablet 3 x /day for Anxiety  . VITAMIN D 5000 UNITS Take  2  times daily.   Marland Kitchen escitalopram (LEXAPRO) 20 MG tablet TAKE 1 TABLET  DAILY FOR MOOD  . ezetimibe (ZETIA) 10 MG tablet TAKE 1 TABLET EVERY DAY  . famciclovir (FAMVIR) 500 MG tablet TAKE 1 TABLET  EVERY DAY  . famotidine (PEPCID) 40 MG tablet Take 1 tabet Daily for Heartburn and Indigestion  . FLONASE nasal spray INSTILL 2 SPRAYS INTO EACH NOSTRIL DAILY   . folic acid (FOLVITE) 1 MG tablet Take   1 tablet   Daily  . ibrutinib 280 MG TABS TAKE 1 TABLET DAILY.  Marland Kitchen levocetirizine (XYZAL) 5 MG tablet TAKE 1 TABLET DAILY FOR ALLERGIES  . olmesartan (BENICAR) 40 MG tablet Take  at bedtime.   . rosuvastatin (CRESTOR) 40 MG tablet Take 1 tablet Dail  . vitamin B-12 500 MCG tablet Take  daily.     Allergies  Allergen Reactions  . Adalimumab Other (See Comments)    "blacked out", confused     PMHx:   Past Medical History:  Diagnosis Date  . Allergy    seasonal  allergies  . Anemia   . Anxiety    on meds  . Arthritis   . Chest pain, non-cardiac   . COPD (chronic obstructive pulmonary disease) ([place])    past smoker  . Counseling regarding goals of care 03/27/2018  . Depression    on meds  . Diverticulitis   . Diverticulosis   . Fractured pelvis ([place])    fall from ladder  . GERD (gastroesophageal reflux disease)    on meds  . Gout   . History of shingles   . Hyperlipidemia    on meds  . Hypertension    on meds  . Internal hemorrhoids   . Iron deficiency anemia 03/13/2018  . Kidney stones   . Other abnormal glucose 10/11/2015  . Rheumatoid arthritis ([place])    hands  . Waldenstrom's macroglobulinemia ([place]) 03/27/2018    Immunization History  Administered Date(s) Administered  . DTaP 06/04/2011  . Hepatitis B 11/26/2010  .  Hepatitis B, ped/adol 08/06/2011  . Influenza Inj Mdck Quad With Preservative 09/13/2017, 08/21/2018, 09/01/2020  . Influenza Split 09/02/2014  . Influenza Whole 09/17/2013  . Influenza, Seasonal, Injecte, Preservative Fre 10/11/2015  . Influenza-Unspecified 09/13/2017  . PFIZER(Purple Top)SARS-COV-2 Vaccination 01/24/2020, 03/06/2020, 08/03/2020  . PPD Test 10/11/2015, 08/05/2019, 09/01/2020  . Pneumococcal Polysaccharide-23 12/06/2020    Past Surgical History:  Procedure Laterality Date  . COLONOSCOPY  2016   JMP-suprep(exc)-mild TICS/int hems/normal-5 yr recall  . FINGER SURGERY    . iron infusion    . KNEE SURGERY     right  . PACEMAKER IMPLANT N/A 08/10/2020   Procedure: PACEMAKER IMPLANT;  Surgeon: Constance Haw, MD;  Location: [facility];  Service: Cardiovascular;  Laterality: N/A;  . [place]   left  . WISDOM TOOTH EXTRACTION      FHx:    Reviewed / unchanged  SHx:    Reviewed / unchanged   Systems Review:  Constitutional: Denies fever, chills, wt changes, headaches, insomnia, fatigue, night sweats, change in appetite. Eyes: Denies redness, blurred vision, diplopia, discharge, itchy, watery eyes.  ENT: Denies discharge, congestion, post nasal drip, epistaxis, sore throat, earache, hearing loss, dental pain, tinnitus, vertigo, sinus pain, snoring.  CV: Denies chest pain, palpitations, irregular heartbeat, syncope, dyspnea, diaphoresis, orthopnea, PND, claudication or edema. Respiratory: denies cough, dyspnea, DOE, pleurisy, hoarseness, laryngitis, wheezing.  Gastrointestinal: Denies dysphagia, odynophagia, heartburn, reflux, water brash, abdominal pain or cramps, nausea, vomiting, bloating, diarrhea, constipation, hematemesis, melena, hematochezia  or hemorrhoids. Genitourinary: Denies dysuria, frequency, urgency, nocturia, hesitancy, discharge, hematuria or flank pain. Musculoskeletal: Denies arthralgias, myalgias, stiffness, jt. swelling,  pain, limping or strain/sprain.  Skin: Denies pruritus, rash, hives, warts, acne, eczema or change in skin lesion(s). Neuro: No weakness, tremor, incoordination, spasms, paresthesia or pain. Psychiatric: Denies confusion, memory loss or sensory loss. Endo: Denies change in weight, skin or hair change.  Heme/Lymph: No excessive bleeding, bruising or enlarged lymph nodes.  Physical Exam  BP 138/82   Pulse 63   Temp (!) 97.5 F (36.4 C) (Temporal)   Resp 16   Ht 5\' 10"  (1.778 m)   Wt 172 lb (78 kg)   SpO2 98%   BMI 24.68 kg/m   Appears  well nourished, well groomed  and in no distress.  Eyes: PERRLA, EOMs, conjunctiva no swelling or erythema. Sinuses: No frontal/maxillary tenderness ENT/Mouth: EAC's clear, TM's nl w/o erythema, bulging. Nares clear w/o erythema, swelling, exudates. Oropharynx clear without erythema or exudates. Oral hygiene is good. Tongue  normal, non obstructing. Hearing intact.  Neck: Supple. Thyroid not palpable. Car 2+/2+ without bruits, nodes or JVD. Chest: Respirations nl with BS clear & equal w/o rales, rhonchi, wheezing or stridor.  Cor: Heart sounds normal w/ regular rate and rhythm without sig. murmurs, gallops, clicks or rubs. Peripheral pulses normal and equal  without edema.  Abdomen: Soft & bowel sounds normal. Non-tender w/o guarding, rebound, hernias, masses or organomegaly.  Lymphatics: Unremarkable.  Musculoskeletal: Full ROM all peripheral extremities, joint stability, 5/5 strength and normal gait.  Skin: Warm, dry without exposed rashes, lesions or ecchymosis apparent.  Neuro: Cranial nerves intact, reflexes equal bilaterally. Sensory-motor testing grossly intact. Tendon reflexes grossly intact.  Pysch: Alert & oriented x 3.  Insight and judgement nl & appropriate. No ideations.  Assessment and Plan:  1. Essential hypertension  - Continue medication, monitor blood pressure at home.  - Continue DASH diet.  Reminder to go to the ER if any CP,   SOB, nausea, dizziness, severe HA, changes vision/speech.  - CBC with Differential/Platelet - COMPLETE METABOLIC PANEL WITH GFR - Magnesium - TSH  2. Hyperlipidemia, mixed  - Continue diet/meds, exercise,& lifestyle modifications.  - Continue monitor periodic cholesterol/liver & renal functions   - Lipid panel - TSH  3. Glucose intolerance  - Continue diet, exercise  - Lifestyle modifications.  - Monitor appropriate labs  - Hemoglobin A1c - Insulin, random  4. Vitamin D deficiency  - Continue supplementation.  - VITAMIN D 25 Hydroxy   5. Waldenstrom's macroglobulinemia ([place])  - COMPLETE METABOLIC PANEL WITH GFR  6. Presence of permanent cardiac pacemaker   7. Medication management  - CBC with Differential/Platelet - COMPLETE METABOLIC PANEL WITH GFR - Magnesium - Lipid panel - TSH - Hemoglobin A1c - Insulin, random - VITAMIN D 25 Hydroxy       Discussed  regular exercise, BP monitoring, weight control to achieve/maintain BMI less than 25 and discussed med and SE's. Recommended labs to assess and monitor clinical status with further disposition pending results of labs.  I discussed the assessment and treatment plan with the patient. The patient was provided an opportunity to ask questions and all were answered. The patient agreed with the plan and demonstrated an understanding of the instructions.  I provided over 30 minutes of exam, counseling, chart review and  complex critical decision making.         The patient was advised to call back or seek an in-person evaluation if the symptoms worsen or if the condition fails to improve as anticipated.   Kirtland Bouchard, MD

## 2021-03-13 NOTE — Patient Instructions (Signed)

## 2021-03-14 ENCOUNTER — Other Ambulatory Visit: Payer: Self-pay

## 2021-03-14 ENCOUNTER — Ambulatory Visit (INDEPENDENT_AMBULATORY_CARE_PROVIDER_SITE_OTHER): Payer: Medicare Other | Admitting: Internal Medicine

## 2021-03-14 VITALS — BP 138/82 | HR 63 | Temp 97.5°F | Resp 16 | Ht 70.0 in | Wt 172.0 lb

## 2021-03-14 DIAGNOSIS — E559 Vitamin D deficiency, unspecified: Secondary | ICD-10-CM

## 2021-03-14 DIAGNOSIS — I10 Essential (primary) hypertension: Secondary | ICD-10-CM | POA: Diagnosis not present

## 2021-03-14 DIAGNOSIS — Z79899 Other long term (current) drug therapy: Secondary | ICD-10-CM

## 2021-03-14 DIAGNOSIS — C8800 Waldenstrom macroglobulinemia not having achieved remission: Secondary | ICD-10-CM

## 2021-03-14 DIAGNOSIS — Z950 Presence of cardiac pacemaker: Secondary | ICD-10-CM

## 2021-03-14 DIAGNOSIS — E782 Mixed hyperlipidemia: Secondary | ICD-10-CM

## 2021-03-14 DIAGNOSIS — E7439 Other disorders of intestinal carbohydrate absorption: Secondary | ICD-10-CM

## 2021-03-15 ENCOUNTER — Other Ambulatory Visit: Payer: Self-pay | Admitting: Internal Medicine

## 2021-03-15 LAB — CBC WITH DIFFERENTIAL/PLATELET
Absolute Monocytes: 469 cells/uL (ref 200–950)
Basophils Absolute: 39 cells/uL (ref 0–200)
Basophils Relative: 0.9 %
Eosinophils Absolute: 69 cells/uL (ref 15–500)
Eosinophils Relative: 1.6 %
HCT: 42.9 % (ref 38.5–50.0)
Hemoglobin: 14 g/dL (ref 13.2–17.1)
Lymphs Abs: 808 cells/uL — ABNORMAL LOW (ref 850–3900)
MCH: 32 pg (ref 27.0–33.0)
MCHC: 32.6 g/dL (ref 32.0–36.0)
MCV: 98.2 fL (ref 80.0–100.0)
MPV: 10.8 fL (ref 7.5–12.5)
Monocytes Relative: 10.9 %
Neutro Abs: 2915 cells/uL (ref 1500–7800)
Neutrophils Relative %: 67.8 %
Platelets: 257 10*3/uL (ref 140–400)
RBC: 4.37 10*6/uL (ref 4.20–5.80)
RDW: 13.8 % (ref 11.0–15.0)
Total Lymphocyte: 18.8 %
WBC: 4.3 10*3/uL (ref 3.8–10.8)

## 2021-03-15 LAB — COMPLETE METABOLIC PANEL WITH GFR
AG Ratio: 2.1 (calc) (ref 1.0–2.5)
ALT: 15 U/L (ref 9–46)
AST: 21 U/L (ref 10–35)
Albumin: 4.5 g/dL (ref 3.6–5.1)
Alkaline phosphatase (APISO): 81 U/L (ref 35–144)
BUN: 15 mg/dL (ref 7–25)
CO2: 26 mmol/L (ref 20–32)
Calcium: 9.6 mg/dL (ref 8.6–10.3)
Chloride: 104 mmol/L (ref 98–110)
Creat: 1.18 mg/dL (ref 0.70–1.25)
GFR, Est African American: 76 mL/min/{1.73_m2} (ref >=60)
GFR, Est Non African American: 66 mL/min/{1.73_m2} (ref >=60)
Globulin: 2.1 g/dL (calc) (ref 1.9–3.7)
Glucose, Bld: 102 mg/dL — ABNORMAL HIGH (ref 65–99)
Potassium: 4.2 mmol/L (ref 3.5–5.3)
Sodium: 140 mmol/L (ref 135–146)
Total Bilirubin: 0.6 mg/dL (ref 0.2–1.2)
Total Protein: 6.6 g/dL (ref 6.1–8.1)

## 2021-03-15 LAB — TSH: TSH: 2.34 mIU/L (ref 0.40–4.50)

## 2021-03-15 LAB — HEMOGLOBIN A1C
Hgb A1c MFr Bld: 5.2 % of total Hgb (ref <5.7)
Mean Plasma Glucose: 103 mg/dL
eAG (mmol/L): 5.7 mmol/L

## 2021-03-15 LAB — INSULIN, RANDOM: Insulin: 6.5 u[IU]/mL

## 2021-03-15 LAB — MAGNESIUM: Magnesium: 2.2 mg/dL (ref 1.5–2.5)

## 2021-03-15 LAB — LIPID PANEL
Cholesterol: 182 mg/dL (ref <200)
HDL: 50 mg/dL (ref >=40)
LDL Cholesterol (Calc): 118 mg/dL (calc) — ABNORMAL HIGH
Non-HDL Cholesterol (Calc): 132 mg/dL (calc) — ABNORMAL HIGH (ref <130)
Total CHOL/HDL Ratio: 3.6 (calc) (ref <5.0)
Triglycerides: 58 mg/dL (ref <150)

## 2021-03-15 LAB — VITAMIN D 25 HYDROXY (VIT D DEFICIENCY, FRACTURES): Vit D, 25-Hydroxy: 56 ng/mL (ref 30–100)

## 2021-03-16 ENCOUNTER — Telehealth: Payer: Self-pay | Admitting: *Deleted

## 2021-03-16 ENCOUNTER — Other Ambulatory Visit: Payer: Self-pay | Admitting: Internal Medicine

## 2021-03-16 MED ORDER — VALACYCLOVIR HCL 1 G PO TABS: ORAL_TABLET | ORAL | 0 refills | Status: DC

## 2021-03-16 NOTE — Telephone Encounter (Signed)
Returned call to patient regarding shingles rash on hip. Dr Melford Aase sent in an RX for Valtrex for the patient. Patient is aware.

## 2021-03-20 ENCOUNTER — Other Ambulatory Visit: Payer: Self-pay | Admitting: Hematology & Oncology

## 2021-03-20 ENCOUNTER — Telehealth: Payer: Self-pay | Admitting: Pharmacy Technician

## 2021-03-20 ENCOUNTER — Other Ambulatory Visit (HOSPITAL_COMMUNITY): Payer: Self-pay

## 2021-03-20 DIAGNOSIS — C880 Waldenstrom macroglobulinemia: Secondary | ICD-10-CM

## 2021-03-20 MED ORDER — IMBRUVICA 280 MG PO TABS
ORAL_TABLET | ORAL | 3 refills | Status: DC
  Filled 2021-03-20: qty 28, 28d supply, fill #0
  Filled 2021-04-18: qty 28, 28d supply, fill #1
  Filled 2021-05-15: qty 28, 28d supply, fill #2
  Filled 2021-06-09: qty 28, 28d supply, fill #3

## 2021-03-20 NOTE — Telephone Encounter (Signed)
Oral Oncology Patient Advocate Encounter  Received fax notification from [facility] that the existing prior authorization for Imbruvica is due for renewal.  Faxed completed questionnaire to [phone]. Status is pending  Oral Oncology Clinic will continue to follow.  [place] Patient Edwin Webster Phone [phone] Fax [phone] 03/20/2021 9:02 AM

## 2021-03-22 NOTE — Telephone Encounter (Signed)
Oral Oncology Patient Advocate Encounter  Prior Authorization for Edwin Webster has been approved.    PA# [id number] Effective dates: 03/20/21 through 03/20/22  Oral Oncology Clinic will continue to follow.   [name] Phone [phone] Fax [phone] 03/22/2021 11:01 AM

## 2021-04-10 ENCOUNTER — Other Ambulatory Visit: Payer: Self-pay | Admitting: Adult Health

## 2021-04-10 ENCOUNTER — Telehealth: Payer: Self-pay | Admitting: Internal Medicine

## 2021-04-10 MED ORDER — TIZANIDINE HCL 4 MG PO TABS: 2.0000 mg | ORAL_TABLET | Freq: Three times a day (TID) | ORAL | 0 refills | Status: DC | PRN

## 2021-04-10 NOTE — Telephone Encounter (Signed)
Patient called, pulled out back moving daughter on Saturday. Requesting please call in muscle reslaxer to [facility] [place], [place] or advise

## 2021-04-11 ENCOUNTER — Other Ambulatory Visit: Payer: Self-pay | Admitting: Adult Health

## 2021-04-11 ENCOUNTER — Other Ambulatory Visit: Payer: Self-pay | Admitting: Internal Medicine

## 2021-04-17 ENCOUNTER — Other Ambulatory Visit (HOSPITAL_COMMUNITY): Payer: Self-pay

## 2021-04-18 ENCOUNTER — Encounter: Payer: Self-pay | Admitting: Hematology & Oncology

## 2021-04-18 ENCOUNTER — Other Ambulatory Visit (HOSPITAL_COMMUNITY): Payer: Self-pay

## 2021-04-18 ENCOUNTER — Inpatient Hospital Stay (HOSPITAL_BASED_OUTPATIENT_CLINIC_OR_DEPARTMENT_OTHER): Payer: Medicare Other | Admitting: Hematology & Oncology

## 2021-04-18 ENCOUNTER — Other Ambulatory Visit: Payer: Self-pay

## 2021-04-18 ENCOUNTER — Telehealth: Payer: Self-pay

## 2021-04-18 ENCOUNTER — Inpatient Hospital Stay: Payer: Medicare Other | Attending: Hematology & Oncology

## 2021-04-18 VITALS — BP 128/77 | HR 79 | Temp 98.0°F | Resp 18 | Wt 173.5 lb

## 2021-04-18 DIAGNOSIS — C8800 Waldenstrom macroglobulinemia not having achieved remission: Secondary | ICD-10-CM

## 2021-04-18 DIAGNOSIS — D500 Iron deficiency anemia secondary to blood loss (chronic): Secondary | ICD-10-CM

## 2021-04-18 DIAGNOSIS — D509 Iron deficiency anemia, unspecified: Secondary | ICD-10-CM | POA: Insufficient documentation

## 2021-04-18 LAB — CBC WITH DIFFERENTIAL (CANCER CENTER ONLY)
Abs Immature Granulocytes: 0.05 10*3/uL (ref 0.00–0.07)
Basophils Absolute: 0 10*3/uL (ref 0.0–0.1)
Basophils Relative: 1 %
Eosinophils Absolute: 0.3 10*3/uL (ref 0.0–0.5)
Eosinophils Relative: 4 %
HCT: 40.4 % (ref 39.0–52.0)
Hemoglobin: 13.6 g/dL (ref 13.0–17.0)
Immature Granulocytes: 1 %
Lymphocytes Relative: 16 %
Lymphs Abs: 0.9 10*3/uL (ref 0.7–4.0)
MCH: 32.9 pg (ref 26.0–34.0)
MCHC: 33.7 g/dL (ref 30.0–36.0)
MCV: 97.8 fL (ref 80.0–100.0)
Monocytes Absolute: 0.8 10*3/uL (ref 0.1–1.0)
Monocytes Relative: 14 %
Neutro Abs: 3.7 10*3/uL (ref 1.7–7.7)
Neutrophils Relative %: 64 %
Platelet Count: 228 10*3/uL (ref 150–400)
RBC: 4.13 MIL/uL — ABNORMAL LOW (ref 4.22–5.81)
RDW: 14.2 % (ref 11.5–15.5)
WBC Count: 5.7 10*3/uL (ref 4.0–10.5)
nRBC: 0 % (ref 0.0–0.2)

## 2021-04-18 LAB — CMP (CANCER CENTER ONLY)
ALT: 14 U/L (ref 0–44)
AST: 17 U/L (ref 15–41)
Albumin: 4.3 g/dL (ref 3.5–5.0)
Alkaline Phosphatase: 72 U/L (ref 38–126)
Anion gap: 8 (ref 5–15)
BUN: 17 mg/dL (ref 8–23)
CO2: 30 mmol/L (ref 22–32)
Calcium: 9.5 mg/dL (ref 8.9–10.3)
Chloride: 101 mmol/L (ref 98–111)
Creatinine: 1.24 mg/dL (ref 0.61–1.24)
GFR, Estimated: >60 mL/min (ref >60)
Glucose, Bld: 83 mg/dL (ref 70–99)
Potassium: 4 mmol/L (ref 3.5–5.1)
Sodium: 139 mmol/L (ref 135–145)
Total Bilirubin: 0.7 mg/dL (ref 0.3–1.2)
Total Protein: 6.5 g/dL (ref 6.5–8.1)

## 2021-04-18 LAB — FERRITIN: Ferritin: 251 ng/mL (ref 24–336)

## 2021-04-18 LAB — IRON AND TIBC
Iron: 97 ug/dL (ref 42–163)
Saturation Ratios: 35 % (ref 20–55)
TIBC: 278 ug/dL (ref 202–409)
UIBC: 180 ug/dL (ref 117–376)

## 2021-04-18 LAB — LACTATE DEHYDROGENASE: LDH: 178 U/L (ref 98–192)

## 2021-04-18 NOTE — Progress Notes (Signed)
Hematology and Oncology Follow Up Visit  Edwin Webster [id number] Mar 04, 1961 60 y.o. 04/18/2021   Principle Diagnosis:  Waldenstrom's macroglobulinemia Iron deficiency anemia  Current Therapy:   Rituxan/bendamustine-s/p cycle 2 -- d/c on 06/11/2018 Imbruvica  280 mg po q day -- changed on 08/06/2019 IV iron as indicated - last received in March 2022     Interim History:  Edwin Webster is here today for follow-up.  His more problem is that he feels somewhat dizzy.  Not sure as to why he would feel dizzy.  He says has had this for about a month.  He does have a pacemaker in place.  When I listened to his heart, I really do not hear anything unusual with his heart rhythm or rate.  He clearly is responding to the [place].  His last M spike was 0.2 g/dL.  The IgM level was 440 mg/dL.  He has had no cough or shortness of breath.  He has had no visual changes.  There is been no weakness.  He has had no change in bowel or bladder habits.  His last iron studies back in February showed a ferritin of 40 with an iron saturation of 18%.  We did go ahead and give a dose of Monoferric.  This worked quite well.  Overall, his performance status is ECOG 1.  Medications:  Allergies as of 04/18/2021      Reactions   Adalimumab Other (See Comments)   "blacked out", confused      Medication List       Accurate as of Apr 18, 2021 10:58 AM. If you have any questions, ask your nurse or doctor.        AMBULATORY NON FORMULARY MEDICATION Diltiazem 2% gel- apple 1 pea sized amount to anal canal 4-6 times daily   aspirin EC 81 MG tablet Take 81 mg daily by mouth.   busPIRone 5 MG tablet Commonly known as: BUSPAR Take 1/2 to 1 tablet 3 x /day for Anxiety   escitalopram 20 MG tablet Commonly known as: LEXAPRO TAKE 1 TABLET BY MOUTH DAILY FOR MOOD   ezetimibe 10 MG tablet Commonly known as: ZETIA TAKE 1 TABLET BY MOUTH EVERY DAY   famciclovir 500 MG tablet Commonly known as: FAMVIR TAKE 1  TABLET BY MOUTH EVERY DAY   famotidine 40 MG tablet Commonly known as: PEPCID Take 1 tabet Daily for Heartburn and Indigestion   fluticasone 50 MCG/ACT nasal spray Commonly known as: FLONASE INSTILL 2 SPRAYS INTO EACH NOSTRIL DAILY AT BEDTIME   folic acid 1 MG tablet Commonly known as: FOLVITE Take  1 tablet  Daily  for Folate Deficiency   ibuprofen 800 MG tablet Commonly known as: Motrin IB Take 1 tablet (800 mg total) by mouth daily as needed.   Imbruvica 280 MG Tabs Generic drug: ibrutinib TAKE 1 TABLET (280 MG) BY MOUTH DAILY.   ipratropium 0.06 % nasal spray Commonly known as: ATROVENT Use 1 to 2 sprays each nostril 2 to 3 x /day as needed   levocetirizine 5 MG tablet Commonly known as: XYZAL TAKE 1 TABLET BY MOUTH DAILY FOR ALLERGIES   olmesartan 40 MG tablet Commonly known as: BENICAR Take 40 mg by mouth at bedtime. For BP   rosuvastatin 40 MG tablet Commonly known as: CRESTOR TAKE 1 TABLET BY MOUTH EVERY DAY FOR CHOLESTEROL   tiZANidine 4 MG tablet Commonly known as: Zanaflex Take 0.5-1 tablets (2-4 mg total) by mouth every 8 (eight) hours as needed for  muscle spasms.   valACYclovir 1000 MG tablet Commonly known as: Valtrex Take 1 tablet 3 x day for Shingles   vitamin B-12 500 MCG tablet Commonly known as: CYANOCOBALAMIN Take 500 mcg by mouth daily.   Vitamin D-3 125 MCG (5000 UT) Tabs Take 5,000 Units by mouth 2 (two) times daily.       Allergies:  Allergies  Allergen Reactions  . Adalimumab Other (See Comments)    "blacked out", confused     Past Medical History, Surgical history, Social history, and Family History were reviewed and updated.  Review of Systems: Review of Systems  Constitutional: Negative.   HENT: Negative.   Eyes: Negative.   Respiratory: Negative.   Cardiovascular: Negative.   Gastrointestinal: Negative.   Genitourinary: Negative.   Musculoskeletal: Negative.   Skin: Negative.   Neurological: Negative.    Endo/Heme/Allergies: Negative.   Psychiatric/Behavioral: Negative.       Physical Exam:  weight is 173 lb 8 oz (78.7 kg). His oral temperature is 98 F (36.7 C). His blood pressure is 128/77 and his pulse is 79. His respiration is 18 and oxygen saturation is 98%.   Wt Readings from Last 3 Encounters:  04/18/21 173 lb 8 oz (78.7 kg)  03/14/21 172 lb (78 kg)  02/24/21 175 lb (79.4 kg)  Well then  Physical Exam Vitals reviewed.  HENT:     Head: Normocephalic and atraumatic.  Eyes:     Pupils: Pupils are equal, round, and reactive to light.  Cardiovascular:     Rate and Rhythm: Normal rate and regular rhythm.     Heart sounds: Normal heart sounds.  Pulmonary:     Effort: Pulmonary effort is normal.     Breath sounds: Normal breath sounds.  Abdominal:     General: Bowel sounds are normal.     Palpations: Abdomen is soft.  Musculoskeletal:        General: No tenderness or deformity. Normal range of motion.     Cervical back: Normal range of motion.  Lymphadenopathy:     Cervical: No cervical adenopathy.  Skin:    General: Skin is warm and dry.     Findings: No erythema or rash.  Neurological:     Mental Status: He is alert and oriented to person, place, and time.  Psychiatric:        Behavior: Behavior normal.        Thought Content: Thought content normal.        Judgment: Judgment normal.      Lab Results  Component Value Date   WBC 5.7 04/18/2021   HGB 13.6 04/18/2021   HCT 40.4 04/18/2021   MCV 97.8 04/18/2021   PLT 228 04/18/2021   Lab Results  Component Value Date   FERRITIN 40 01/19/2021   IRON 59 01/19/2021   TIBC 324 01/19/2021   UIBC 265 01/19/2021   IRONPCTSAT 18 (L) 01/19/2021   Lab Results  Component Value Date   RETICCTPCT 0.9 02/12/2018   RBC 4.13 (L) 04/18/2021   RETICCTABS 33,120 02/12/2018   Lab Results  Component Value Date   KPAFRELGTCHN 24.4 (H) 01/19/2021   LAMBDASER 12.4 01/19/2021   KAPLAMBRATIO 1.97 (H) 01/19/2021   Lab  Results  Component Value Date   IGGSERUM 459 (L) 01/19/2021   IGGSERUM 496 (L) 01/19/2021   IGA 39 (L) 01/19/2021   IGA 40 (L) 01/19/2021   IGMSERUM 422 (H) 01/19/2021   IGMSERUM 441 (H) 01/19/2021   Lab Results  Component Value  Date   TOTALPROTELP 5.8 (L) 01/19/2021   ALBUMINELP 3.7 01/19/2021   A1GS 0.2 01/19/2021   A2GS 0.6 01/19/2021   BETS 0.8 01/19/2021   BETA2SER 0.2 02/18/2018   GAMS 0.6 01/19/2021   MSPIKE 0.2 (H) 01/19/2021   SPEI Comment 07/09/2018     Chemistry      Component Value Date/Time   NA 139 04/18/2021 0953   NA 141 08/04/2020 1203   K 4.0 04/18/2021 0953   CL 101 04/18/2021 0953   CO2 30 04/18/2021 0953   BUN 17 04/18/2021 0953   BUN 13 08/04/2020 1203   CREATININE 1.24 04/18/2021 0953   CREATININE 1.18 03/14/2021 1104      Component Value Date/Time   CALCIUM 9.5 04/18/2021 0953   ALKPHOS 72 04/18/2021 0953   AST 17 04/18/2021 0953   ALT 14 04/18/2021 0953   BILITOT 0.7 04/18/2021 0953      Impression and Plan: Mr. Griffing is a very pleasant 60 yo caucasian gentleman with Waldenstrom's macroglobulinemia.   I am not sure why he has this unsteadiness.  Again, I think this is some his family doctor should be able to help out with.  I am unsure if he will see his family doctor.  I do not think he needs a MRI of the brain.  I cannot find anything neurological on exam.  I am just happy that he is responding to the [place].  We will see what his monoclonal studies look like today.  I will plan to see him back in another 3 months.      [name], MD 5/24/202210:58 AM

## 2021-04-18 NOTE — Telephone Encounter (Signed)
appts made and printed for pt per 04/18/21 los  Edwin Webster

## 2021-04-19 LAB — IGG, IGA, IGM
IgA: 34 mg/dL — ABNORMAL LOW (ref 61–437)
IgG (Immunoglobin G), Serum: 432 mg/dL — ABNORMAL LOW (ref 603–1613)
IgM (Immunoglobulin M), Srm: 442 mg/dL — ABNORMAL HIGH (ref 20–172)

## 2021-04-19 LAB — KAPPA/LAMBDA LIGHT CHAINS
Kappa free light chain: 24.8 mg/L — ABNORMAL HIGH (ref 3.3–19.4)
Kappa, lambda light chain ratio: 2.56 — ABNORMAL HIGH (ref 0.26–1.65)
Lambda free light chains: 9.7 mg/L (ref 5.7–26.3)

## 2021-04-21 LAB — IMMUNOFIXATION REFLEX, SERUM
IgA: 35 mg/dL — ABNORMAL LOW (ref 61–437)
IgG (Immunoglobin G), Serum: 423 mg/dL — ABNORMAL LOW (ref 603–1613)
IgM (Immunoglobulin M), Srm: 452 mg/dL — ABNORMAL HIGH (ref 20–172)

## 2021-04-21 LAB — PROTEIN ELECTROPHORESIS, SERUM, WITH REFLEX
A/G Ratio: 1.7 (ref 0.7–1.7)
Albumin ELP: 4 g/dL (ref 2.9–4.4)
Alpha-1-Globulin: 0.2 g/dL (ref 0.0–0.4)
Alpha-2-Globulin: 0.8 g/dL (ref 0.4–1.0)
Beta Globulin: 0.7 g/dL (ref 0.7–1.3)
Gamma Globulin: 0.6 g/dL (ref 0.4–1.8)
Globulin, Total: 2.3 g/dL (ref 2.2–3.9)
M-Spike, %: 0.3 g/dL — ABNORMAL HIGH
SPEP Interpretation: 0
Total Protein ELP: 6.3 g/dL (ref 6.0–8.5)

## 2021-05-01 ENCOUNTER — Other Ambulatory Visit: Payer: Self-pay | Admitting: Adult Health

## 2021-05-11 ENCOUNTER — Ambulatory Visit (INDEPENDENT_AMBULATORY_CARE_PROVIDER_SITE_OTHER): Payer: Medicare Other

## 2021-05-11 ENCOUNTER — Other Ambulatory Visit (HOSPITAL_COMMUNITY): Payer: Self-pay

## 2021-05-11 DIAGNOSIS — I495 Sick sinus syndrome: Secondary | ICD-10-CM

## 2021-05-11 LAB — CUP PACEART REMOTE DEVICE CHECK
Date Time Interrogation Session: 20220616064942
Implantable Lead Implant Date: 20210915
Implantable Lead Implant Date: 20210915
Implantable Lead Location: 753859
Implantable Lead Location: 753860
Implantable Pulse Generator Implant Date: 20210915
Pulse Gen Model: 2272
Pulse Gen Serial Number: 3864545

## 2021-05-15 ENCOUNTER — Other Ambulatory Visit (HOSPITAL_COMMUNITY): Payer: Self-pay

## 2021-05-15 NOTE — Progress Notes (Signed)
Assessment and Plan:  Edwin Webster was seen today for back pain.  Diagnoses and all orders for this visit:  Acute midline low back pain without sciatica - negative straight leg Prednisone was not prescribed pending xray results to r/o fracture, continue NSAIDs, RICE, and exercise given Will send steroid vs NSAID pending xray results  If not better follow up in office or will refer to PT/orthopedics. Neurosurgeon distributed. Ice to affected area as needed for local pain relief. Heat to affected area as needed for local pain relief. Follow-up in 2 weeks. If not improved -     DG Lumbar Spine Complete; Future  Further disposition pending results of labs. Discussed med's effects and SE's.   Over 20 minutes of exam, counseling, chart review, and critical decision making was performed.   Future Appointments  Date Time Provider [place]  05/17/2021 10:00 AM Liane Comber, NP [facility] None  07/21/2021 10:00 AM [facility] None  07/21/2021 10:30 AM Ennever, Rudell Cobb, MD [facility] None  08/10/2021  7:10 AM [facility] DEVICE REMOTES [facility]  09/18/2021  2:00 PM Unk Pinto, MD [facility] None  11/09/2021  7:10 AM [facility] DEVICE REMOTES [facility]  12/06/2021 11:00 AM Liane Comber, NP [facility] None    ------------------------------------------------------------------------------------------------------------------   HPI 60 y.o.male with htn, Waldenstrom's macroglobulinemia presents for evaluation of back pain x 5 weeks.   He reports prior to onset was helping daughter move a recliner, had abrupt lumbar pain with shooting pain down legs, felt weak; his SIL helped him to a chair, was able to slowly ambulate, tried tizanidine with some benefit, icing regularly, hot showers, has had persistent lumbar midline pain and generalized lumbar; he reports improved when sleeping, does better sleeping on side.   Reports no  numbness/tingling/weakness of extremities in recent weeks, but very sharp shooting pain in lumbar back, worse after jostled or with movement.   No previous back imaging    Past Medical History:  Diagnosis Date   Allergy    seasonal allergies   Anemia    Anxiety    on meds   Arthritis    Chest pain, non-cardiac    COPD (chronic obstructive pulmonary disease) ([place])    past smoker   Counseling regarding goals of care 03/27/2018   Depression    on meds   Diverticulitis    Diverticulosis    Fractured pelvis (HCC)    fall from ladder   GERD (gastroesophageal reflux disease)    on meds   Gout    History of shingles    Hyperlipidemia    on meds   Hypertension    on meds   Internal hemorrhoids    Iron deficiency anemia 03/13/2018   Kidney stones    Other abnormal glucose 10/11/2015   Rheumatoid arthritis (HCC)    hands   Waldenstrom's macroglobulinemia ([place]) 03/27/2018     Allergies  Allergen Reactions   Adalimumab Other (See Comments)    "blacked out", confused     Current Outpatient Medications on File Prior to Visit  Medication Sig   AMBULATORY NON FORMULARY MEDICATION Diltiazem 2% gel- apple 1 pea sized amount to anal canal 4-6 times daily   aspirin EC 81 MG tablet Take 81 mg daily by mouth.   busPIRone (BUSPAR) 5 MG tablet Take 1/2 to 1 tablet 3 x /day for Anxiety   Cholecalciferol (VITAMIN D-3) 5000 UNITS TABS Take 5,000 Units by mouth 2 (two) times daily.    escitalopram (LEXAPRO) 20 MG tablet  TAKE 1 TABLET BY MOUTH DAILY FOR MOOD   ezetimibe (ZETIA) 10 MG tablet TAKE 1 TABLET BY MOUTH EVERY DAY   famciclovir (FAMVIR) 500 MG tablet TAKE 1 TABLET BY MOUTH EVERY DAY   famotidine (PEPCID) 40 MG tablet Take 1 tabet Daily for Heartburn and Indigestion   fluticasone (FLONASE) 50 MCG/ACT nasal spray INSTILL 2 SPRAYS INTO EACH NOSTRIL DAILY AT BEDTIME   folic acid (FOLVITE) 1 MG tablet Take  1 tablet  Daily  for Folate Deficiency   ibrutinib (IMBRUVICA) 280 MG TABS TAKE 1  TABLET (280 MG) BY MOUTH DAILY.   ibuprofen (ADVIL,MOTRIN) 800 MG tablet Take 1 tablet (800 mg total) by mouth daily as needed.   ipratropium (ATROVENT) 0.06 % nasal spray Use 1 to 2 sprays each nostril 2 to 3 x /day as needed   levocetirizine (XYZAL) 5 MG tablet TAKE 1 TABLET BY MOUTH DAILY FOR ALLERGIES   olmesartan (BENICAR) 40 MG tablet Take 40 mg by mouth at bedtime. For BP   rosuvastatin (CRESTOR) 40 MG tablet TAKE 1 TABLET BY MOUTH EVERY DAY FOR CHOLESTEROL   tiZANidine (ZANAFLEX) 4 MG tablet Take  1/2 to 1 tablet   3 x /day  as needed for muscle spasm   valACYclovir (VALTREX) 1000 MG tablet Take 1 tablet 3 x day for Shingles   vitamin B-12 (CYANOCOBALAMIN) 500 MCG tablet Take 500 mcg by mouth daily.   No current facility-administered medications on file prior to visit.    ROS: all negative except above.   Physical Exam:  There were no vitals taken for this visit.  General Appearance: Well nourished, in no apparent distress. Eyes: PERRLA, EOMs, conjunctiva no swelling or erythema Sinuses: No Frontal/maxillary tenderness ENT/Mouth: Ext aud canals clear, TMs without erythema, bulging. No erythema, swelling, or exudate on post pharynx.  Tonsils not swollen or erythematous. Hearing normal.  Neck: Supple, thyroid normal.  Respiratory: Respiratory effort normal Cardio: Appears well perfused. Brisk peripheral pulses without edema.  Musculoskeletal: Slow steady gait Patient is able to ambulate well. Gait is not  Antalgic. Straight leg raising with dorsiflexion negative bilaterally for radicular symptoms. Sensory exam in the legs are normal.  Strength is normal and symmetric in arms and legs. There is not SI tenderness to palpation.  There is paraspinal muscle spasm.  There is midline tenderness.  ROM of spine with  limited in all spheres due to pain.  Skin: Warm, dry without rashes, lesions, ecchymosis.  Neuro: Normal muscle tone,  Sensation intact.  Psych: Awake and oriented X 3, normal  affect, Insight and Judgment appropriate.     [name], NP 5:41 PM [facility]

## 2021-05-17 ENCOUNTER — Other Ambulatory Visit (HOSPITAL_COMMUNITY): Payer: Self-pay

## 2021-05-17 ENCOUNTER — Encounter: Payer: Self-pay | Admitting: Adult Health

## 2021-05-17 ENCOUNTER — Ambulatory Visit
Admission: RE | Admit: 2021-05-17 | Discharge: 2021-05-17 | Disposition: A | Discharge status: Home / Self Care | Payer: Medicare Other | Source: Ambulatory Visit | Attending: Adult Health | Admitting: Adult Health

## 2021-05-17 ENCOUNTER — Other Ambulatory Visit: Payer: Self-pay

## 2021-05-17 ENCOUNTER — Ambulatory Visit (INDEPENDENT_AMBULATORY_CARE_PROVIDER_SITE_OTHER): Payer: Medicare Other | Admitting: Adult Health

## 2021-05-17 VITALS — BP 148/74 | HR 60 | Temp 97.2°F | Ht 70.0 in | Wt 169.0 lb

## 2021-05-17 DIAGNOSIS — M545 Low back pain, unspecified: Secondary | ICD-10-CM | POA: Diagnosis not present

## 2021-05-17 NOTE — Patient Instructions (Signed)
Acute Back Pain, Adult Acute back pain is sudden and usually short-lived. It is often caused by an injury to the muscles and tissues in the back. The injury may result from: A muscle or ligament getting overstretched or torn (strained). Ligaments are tissues that connect bones to each other. Lifting something improperly can cause a back strain. Wear and tear (degeneration) of the spinal disks. Spinal disks are circular tissue that provide cushioning between the bones of the spine (vertebrae). Twisting motions, such as while playing sports or doing yard work. A hit to the back. Arthritis. You may have a physical exam, lab tests, and imaging tests to find the cause ofyour pain. Acute back pain usually goes away with rest and home care. Follow these instructions at home: Managing pain, stiffness, and swelling Treatment may include medicines for pain and inflammation that are taken by mouth or applied to the skin, prescription pain medicine, or muscle relaxants. Take over-the-counter and prescription medicines only as told by your health care provider. Your health care provider may recommend applying ice during the first 24-48 hours after your pain starts. To do this: Put ice in a plastic bag. Place a towel between your skin and the bag. Leave the ice on for 20 minutes, 2-3 times a day. If directed, apply heat to the affected area as often as told by your health care provider. Use the heat source that your health care provider recommends, such as a moist heat pack or a heating pad. Place a towel between your skin and the heat source. Leave the heat on for 20-30 minutes. Remove the heat if your skin turns bright red. This is especially important if you are unable to feel pain, heat, or cold. You have a greater risk of getting burned. Activity  Do not stay in bed. Staying in bed for more than 1-2 days can delay your recovery. Sit up and stand up straight. Avoid leaning forward when you sit or  hunching over when you stand. If you work at a desk, sit close to it so you do not need to lean over. Keep your chin tucked in. Keep your neck drawn back, and keep your elbows bent at a 90-degree angle (right angle). Sit high and close to the steering wheel when you drive. Add lower back (lumbar) support to your car seat, if needed. Take short walks on even surfaces as soon as you are able. Try to increase the length of time you walk each day. Do not sit, drive, or stand in one place for more than 30 minutes at a time. Sitting or standing for long periods of time can put stress on your back. Do not drive or use heavy machinery while taking prescription pain medicine. Use proper lifting techniques. When you bend and lift, use positions that put less stress on your back: Bend your knees. Keep the load close to your body. Avoid twisting. Exercise regularly as told by your health care provider. Exercising helps your back heal faster and helps prevent back injuries by keeping muscles strong and flexible. Work with a physical therapist to make a safe exercise program, as recommended by your health care provider. Do any exercises as told by your physical therapist.  Lifestyle Maintain a healthy weight. Extra weight puts stress on your back and makes it difficult to have good posture. Avoid activities or situations that make you feel anxious or stressed. Stress and anxiety increase muscle tension and can make back pain worse. Learn ways to manage   anxiety and stress, such as through exercise. General instructions Sleep on a firm mattress in a comfortable position. Try lying on your side with your knees slightly bent. If you lie on your back, put a pillow under your knees. Follow your treatment plan as told by your health care provider. This may include: Cognitive or behavioral therapy. Acupuncture or massage therapy. Meditation or yoga. Contact a health care provider if: You have pain that is not  relieved with rest or medicine. You have increasing pain going down into your legs or buttocks. Your pain does not improve after 2 weeks. You have pain at night. You lose weight without trying. You have a fever or chills. Get help right away if: You develop new bowel or bladder control problems. You have unusual weakness or numbness in your arms or legs. You develop nausea or vomiting. You develop abdominal pain. You feel faint. Summary Acute back pain is sudden and usually short-lived. Use proper lifting techniques. When you bend and lift, use positions that put less stress on your back. Take over-the-counter and prescription medicines and apply heat or ice as directed by your health care provider. This information is not intended to replace advice given to you by your health care provider. Make sure you discuss any questions you have with your healthcare provider. Document Revised: 08/02/2020 Document Reviewed: 08/05/2020 Elsevier Patient Education  2022 Elsevier Inc.  

## 2021-05-18 ENCOUNTER — Other Ambulatory Visit: Payer: Self-pay | Admitting: Adult Health

## 2021-05-18 DIAGNOSIS — S22080A Wedge compression fracture of T11-T12 vertebra, initial encounter for closed fracture: Secondary | ICD-10-CM | POA: Insufficient documentation

## 2021-05-18 DIAGNOSIS — Z13820 Encounter for screening for osteoporosis: Secondary | ICD-10-CM

## 2021-05-18 MED ORDER — GABAPENTIN 300 MG PO CAPS: ORAL_CAPSULE | ORAL | 0 refills | Status: DC

## 2021-05-18 MED ORDER — MELOXICAM 15 MG PO TABS: ORAL_TABLET | ORAL | 1 refills | Status: DC

## 2021-05-22 ENCOUNTER — Other Ambulatory Visit: Payer: Self-pay | Admitting: Adult Health

## 2021-05-22 ENCOUNTER — Telehealth: Payer: Self-pay

## 2021-05-22 DIAGNOSIS — S22080A Wedge compression fracture of T11-T12 vertebra, initial encounter for closed fracture: Secondary | ICD-10-CM

## 2021-05-22 MED ORDER — TRAMADOL HCL 50 MG PO TABS: ORAL_TABLET | ORAL | 0 refills | Status: DC

## 2021-05-22 NOTE — Telephone Encounter (Signed)
Patient states that nothing is working for the pain. Please send in Tramadol.

## 2021-05-22 NOTE — Telephone Encounter (Signed)
The pain from his back is now radiating down into his hips. Pain medication helps a little, but he has been up since 2:30am due to the pain. Please advise.

## 2021-05-23 NOTE — Addendum Note (Signed)
Addended by: [name] on: 05/23/2021 01:39 PM   Modules accepted: Orders

## 2021-05-25 ENCOUNTER — Other Ambulatory Visit: Payer: Self-pay

## 2021-05-25 ENCOUNTER — Other Ambulatory Visit: Payer: Self-pay | Admitting: Adult Health

## 2021-05-25 ENCOUNTER — Encounter: Payer: Self-pay | Admitting: Orthopaedic Surgery

## 2021-05-25 ENCOUNTER — Ambulatory Visit (INDEPENDENT_AMBULATORY_CARE_PROVIDER_SITE_OTHER): Payer: Medicare Other | Admitting: Orthopaedic Surgery

## 2021-05-25 DIAGNOSIS — S22080A Wedge compression fracture of T11-T12 vertebra, initial encounter for closed fracture: Secondary | ICD-10-CM | POA: Diagnosis not present

## 2021-05-25 NOTE — Progress Notes (Signed)
Office Visit Note   Patient: Edwin Webster           Date of Birth: 01/10/1961           MRN: [id number] Visit Date: 05/25/2021              Requested by: Liane Comber, NP [address] [place],  [place] [id number] PCP: Unk Pinto, MD   Assessment & Plan: Visit Diagnoses:  1. Compression fracture of T12 vertebra, initial encounter ([place])     Plan: Compression fracture T12.  Previous mild compression L1 which may have occurred when he fell off a ladder several years ago.  He is already scheduled for a bone density test.  We discussed avoiding bending and lifting.  Recheck him in 5 weeks with AP and lateral lumbar x-ray on return to include T12.  Follow-Up Instructions: Return in about 5 weeks (around 06/29/2021).   Orders:  No orders of the defined types were placed in this encounter.  No orders of the defined types were placed in this encounter.     Procedures: No procedures performed   Clinical Data: No additional findings.   Subjective: Chief Complaint  Patient presents with   T12 compression fracture    HPI 60 year old male with a diagnosis of Waldenstrom's macroglobulinemia 6 weeks ago was helping her daughter move a low seat was lifting suddenly felt sharp excruciating pain down his leg.  He relates a history where he had a fall from a ladder with persistent pain and up getting a CT scan that showed nondisplaced pelvic fracture and lab work at the time led to the diagnosis of his macroglobulinemia.  Patient is followed by Dr. Marin Olp.  Patient's had pain with bending.  He tried some tramadol and states it made him feel lightheaded and loopy.  X-rays on PACS are available for review.  Patient a previous chest x-ray 08/10/2020 which showed some chronic compression at L1.  There was new compression at T12.  Slight anterolisthesis at L5-S1 was noted.  Patient has a pacemaker .  Review of Systems no myelopathic symptoms.  No bowel bladder current  symptoms.   Objective: Vital Signs: Ht 5\' 10"  (1.778 m)   Wt 169 lb (76.7 kg)   BMI 24.25 kg/m   Physical Exam Constitutional:      Appearance: He is well-developed.  HENT:     Head: Normocephalic and atraumatic.     Right Ear: External ear normal.     Left Ear: External ear normal.  Eyes:     Pupils: Pupils are equal, round, and reactive to light.  Neck:     Thyroid: No thyromegaly.     Trachea: No tracheal deviation.  Cardiovascular:     Rate and Rhythm: Normal rate.  Pulmonary:     Effort: Pulmonary effort is normal.     Breath sounds: No wheezing.  Abdominal:     General: Bowel sounds are normal.     Palpations: Abdomen is soft.  Musculoskeletal:     Cervical back: Neck supple.  Skin:    General: Skin is warm and dry.     Capillary Refill: Capillary refill takes less than 2 seconds.  Neurological:     Mental Status: He is alert and oriented to person, place, and time.  Psychiatric:        Behavior: Behavior normal.        Thought Content: Thought content normal.        Judgment: Judgment normal.  Ortho Exam patient is amatory without any walking aids.  No atrophy of the quads, anterior compartment leg posterior compartment the leg are normal.  Normal heel toe gait.  Specialty Comments:  No specialty comments available.  Imaging: No results found.   PMFS History: Patient Active Problem List   Diagnosis Date Noted   Thoracic compression fracture ([place]) 05/25/2021   Compression fracture of T12 vertebra ([place]) 05/18/2021   History of basal cell cancer 12/06/2020   Sinus bradycardia 07/15/2020   Atypical chest pain 07/15/2020   Major depressive disorder, recurrent episode, mild with anxious distress ([place]) 08/21/2018   Waldenstrom's macroglobulinemia ([place]) 03/27/2018   Iron deficiency anemia 03/13/2018   Ventral hernia 10/24/2017   Abnormal glucose 10/11/2015   BMI 24.0-24.9, adult 10/11/2015   Seronegative rheumatoid arthritis ([place]) 09/02/2014    Gonalgia 07/07/2014   Hyperlipidemia 07/01/2014   Hypertension 07/01/2014   Vitamin D deficiency 07/01/2014   Medication management 07/01/2014   Past Medical History:  Diagnosis Date   Allergy    seasonal allergies   Anemia    Anxiety    on meds   Arthritis    Chest pain, non-cardiac    COPD (chronic obstructive pulmonary disease) (HCC)    past smoker   Counseling regarding goals of care 03/27/2018   Depression    on meds   Diverticulitis    Diverticulosis    Fractured pelvis (HCC)    fall from ladder   GERD (gastroesophageal reflux disease)    on meds   Gout    History of shingles    Hyperlipidemia    on meds   Hypertension    on meds   Internal hemorrhoids    Iron deficiency anemia 03/13/2018   Kidney stones    Other abnormal glucose 10/11/2015   Rheumatoid arthritis ([place])    hands   Waldenstrom's macroglobulinemia ([place]) 03/27/2018    Family History  Problem Relation Age of Onset   Hyperlipidemia Mother    COPD Mother    Heart failure Mother    Colon polyps Mother    Breast cancer Mother    Rectal cancer Mother    Hyperlipidemia Brother    Colon polyps Brother    Stroke Maternal Grandfather    COPD Maternal Grandfather        colon   Colon cancer Maternal Grandfather 21   Colon polyps Maternal Grandfather 53   Hypertension Father    Colon polyps Daughter 65       lynch syndrome   Esophageal cancer Neg Hx    Stomach cancer Neg Hx     Past Surgical History:  Procedure Laterality Date   COLONOSCOPY  2016   JMP-suprep(exc)-mild TICS/int hems/normal-5 yr recall   FINGER SURGERY     iron infusion     KNEE SURGERY     right   PACEMAKER IMPLANT N/A 08/10/2020   Procedure: PACEMAKER IMPLANT;  Surgeon: Constance Haw, MD;  Location: [facility];  Service: Cardiovascular;  Laterality: N/A;   ROTATOR CUFF REPAIR  1996   left   WISDOM TOOTH EXTRACTION     Social History   Occupational History   Not on file  Tobacco Use   Smoking status:  Former    Packs/day: 2.00    Pack years: 0.00    Types: Cigarettes    Quit date: 07/03/2010    Years since quitting: 10.9   Smokeless tobacco: Never  Vaping Use   Vaping Use: Never used  Substance  and Sexual Activity   Alcohol use: No    Alcohol/week: 0.0 standard drinks   Drug use: No   Sexual activity: Not on file

## 2021-05-27 ENCOUNTER — Other Ambulatory Visit: Payer: Self-pay | Admitting: Internal Medicine

## 2021-06-01 NOTE — Progress Notes (Signed)
Remote pacemaker transmission.   

## 2021-06-02 ENCOUNTER — Telehealth: Payer: Self-pay | Admitting: Orthopaedic Surgery

## 2021-06-02 ENCOUNTER — Other Ambulatory Visit (HOSPITAL_COMMUNITY): Payer: Self-pay

## 2021-06-02 NOTE — Telephone Encounter (Signed)
Called patient left message on voicemail to return call to schedule an appointment with Dr Lorin Mercy to recheck fractured vertebrae.

## 2021-06-05 ENCOUNTER — Encounter: Payer: Self-pay | Admitting: Orthopaedic Surgery

## 2021-06-05 ENCOUNTER — Ambulatory Visit (INDEPENDENT_AMBULATORY_CARE_PROVIDER_SITE_OTHER): Payer: Medicare Other | Admitting: Orthopaedic Surgery

## 2021-06-05 ENCOUNTER — Other Ambulatory Visit: Payer: Self-pay

## 2021-06-05 VITALS — BP 142/70 | HR 67 | Ht 69.0 in | Wt 172.0 lb

## 2021-06-05 DIAGNOSIS — S22080D Wedge compression fracture of T11-T12 vertebra, subsequent encounter for fracture with routine healing: Secondary | ICD-10-CM

## 2021-06-05 NOTE — Progress Notes (Signed)
`  Office Visit Note   Patient: Edwin Webster           Date of Birth: 10-Sep-1961           MRN: [id number] Visit Date: 06/05/2021              Requested by: Unk Pinto, [address] [place],   [id number] PCP: Unk Pinto, MD   Assessment & Plan: Visit Diagnoses:  1. Compression fracture of T12 vertebra with routine healing, subsequent encounter     Plan: Return 1 month with repeat images T12 AP and lateral.  Follow-Up Instructions: Return in about 1 month (around 07/06/2021).   Orders:  No orders of the defined types were placed in this encounter.  No orders of the defined types were placed in this encounter.     Procedures: No procedures performed   Clinical Data: No additional findings.   Subjective: Chief Complaint  Patient presents with   T12 compression fracture follow up    HPI 60 year old male seen in follow-up with T12 compression fracture from lifting a sofa 8 weeks ago.  He states last 2 weeks his pain has been about the same he is better sitting than laying down.  He is ambulatory has not had any falls has to move slow to avoid increased pain.  He has diagnosis of macroglobulinemia.  No associated bowel bladder symptoms since I last saw him.  He has used tramadol occasionally when needed for pain.  Review of Systems 14 point system update unchanged from 05/25/2021 office visit.   Objective: Vital Signs: BP (!) 142/70   Pulse 67   Ht 5\' 9"  (1.753 m)   Wt 172 lb (78 kg)   BMI 25.40 kg/m   Physical Exam Constitutional:      Appearance: He is well-developed.  HENT:     Head: Normocephalic and atraumatic.     Right Ear: External ear normal.     Left Ear: External ear normal.  Eyes:     Pupils: Pupils are equal, round, and reactive to light.  Neck:     Thyroid: No thyromegaly.     Trachea: No tracheal deviation.  Cardiovascular:     Rate and Rhythm: Normal rate.  Pulmonary:     Effort: Pulmonary effort is  normal.     Breath sounds: No wheezing.  Abdominal:     General: Bowel sounds are normal.     Palpations: Abdomen is soft.  Musculoskeletal:     Cervical back: Neck supple.  Skin:    General: Skin is warm and dry.     Capillary Refill: Capillary refill takes less than 2 seconds.  Neurological:     Mental Status: He is alert and oriented to person, place, and time.  Psychiatric:        Behavior: Behavior normal.        Thought Content: Thought content normal.        Judgment: Judgment normal.    Ortho Exam no lower extremity atrophy.  Normal heel toe gait.  He uses his arms getting from sitting to standing.  Tenderness at the T11-T12 region just off the midline right and left.    Specialty Comments:  No specialty comments available.  Imaging: No results found.   PMFS History: Patient Active Problem List   Diagnosis Date Noted   Thoracic compression fracture ([place]) 05/25/2021   Compression fracture of T12 vertebra ([place]) 05/18/2021   History of basal cell cancer 12/06/2020  Sinus bradycardia 07/15/2020   Atypical chest pain 07/15/2020   Major depressive disorder, recurrent episode, mild with anxious distress ([place]) 08/21/2018   Waldenstrom's macroglobulinemia ([place]) 03/27/2018   Iron deficiency anemia 03/13/2018   Ventral hernia 10/24/2017   Abnormal glucose 10/11/2015   BMI 24.0-24.9, adult 10/11/2015   Seronegative rheumatoid arthritis ([place]) 09/02/2014   Gonalgia 07/07/2014   Hyperlipidemia 07/01/2014   Hypertension 07/01/2014   Vitamin D deficiency 07/01/2014   Medication management 07/01/2014   Past Medical History:  Diagnosis Date   Allergy    seasonal allergies   Anemia    Anxiety    on meds   Arthritis    Chest pain, non-cardiac    COPD (chronic obstructive pulmonary disease) (H[facility])    past smoker   Counseling regarding goals of care 03/27/2018   Depression    on meds   Diverticulitis    Diverticulosis    Fractured pelvis (H[facility])    fall from ladder    GERD (gastroesophageal reflux disease)    on meds   Gout    History of shingles    Hyperlipidemia    on meds   Hypertension    on meds   Internal hemorrhoids    Iron deficiency anemia 03/13/2018   Kidney stones    Other abnormal glucose 10/11/2015   Rheumatoid arthritis ([place])    hands   Waldenstrom's macroglobulinemia ([place]) 03/27/2018    Family History  Problem Relation Age of Onset   Hyperlipidemia Mother    COPD Mother    Heart failure Mother    Colon polyps Mother    Breast cancer Mother    Rectal cancer Mother    Hyperlipidemia Brother    Colon polyps Brother    Stroke Maternal Grandfather    COPD Maternal Grandfather        colon   Colon cancer Maternal Grandfather 88   Colon polyps Maternal Grandfather 69   Hypertension Father    Colon polyps Daughter 68       lynch syndrome   Esophageal cancer Neg Hx    Stomach cancer Neg Hx     Past Surgical History:  Procedure Laterality Date   COLONOSCOPY  2016   JMP-suprep(exc)-mild TICS/int hems/normal-5 yr recall   FINGER SURGERY     iron infusion     KNEE SURGERY     right   PACEMAKER IMPLANT N/A 08/10/2020   Procedure: PACEMAKER IMPLANT;  Surgeon: Constance Haw, MD;  Location: [facility];  Service: Cardiovascular;  Laterality: N/A;   ROTATOR CUFF REPAIR  1996   left   WISDOM TOOTH EXTRACTION     Social History   Occupational History   Not on file  Tobacco Use   Smoking status: Former    Packs/day: 2.00    Pack years: 0.00    Types: Cigarettes    Quit date: 07/03/2010    Years since quitting: 10.9   Smokeless tobacco: Never  Vaping Use   Vaping Use: Never used  Substance and Sexual Activity   Alcohol use: No    Alcohol/week: 0.0 standard drinks   Drug use: No   Sexual activity: Not on file

## 2021-06-09 ENCOUNTER — Other Ambulatory Visit (HOSPITAL_COMMUNITY): Payer: Self-pay

## 2021-06-11 ENCOUNTER — Other Ambulatory Visit: Payer: Self-pay | Admitting: Internal Medicine

## 2021-06-13 ENCOUNTER — Other Ambulatory Visit (HOSPITAL_COMMUNITY): Payer: Self-pay

## 2021-06-16 ENCOUNTER — Other Ambulatory Visit (HOSPITAL_COMMUNITY): Payer: Self-pay

## 2021-06-18 ENCOUNTER — Other Ambulatory Visit: Payer: Self-pay | Admitting: Adult Health

## 2021-06-18 DIAGNOSIS — S22080A Wedge compression fracture of T11-T12 vertebra, initial encounter for closed fracture: Secondary | ICD-10-CM

## 2021-06-22 ENCOUNTER — Other Ambulatory Visit: Payer: Self-pay

## 2021-06-22 ENCOUNTER — Ambulatory Visit
Admission: RE | Admit: 2021-06-22 | Discharge: 2021-06-22 | Disposition: A | Discharge status: Home / Self Care | Payer: Medicare Other | Source: Ambulatory Visit | Attending: Adult Health | Admitting: Adult Health

## 2021-06-22 DIAGNOSIS — Z13820 Encounter for screening for osteoporosis: Secondary | ICD-10-CM

## 2021-06-22 DIAGNOSIS — S22080A Wedge compression fracture of T11-T12 vertebra, initial encounter for closed fracture: Secondary | ICD-10-CM

## 2021-06-26 ENCOUNTER — Other Ambulatory Visit: Payer: Self-pay | Admitting: Adult Health

## 2021-06-26 DIAGNOSIS — M810 Age-related osteoporosis without current pathological fracture: Secondary | ICD-10-CM

## 2021-06-26 MED ORDER — ALENDRONATE SODIUM 70 MG PO TABS
70.0000 mg | ORAL_TABLET | ORAL | 3 refills | Status: DC
Start: 2021-06-26 — End: 2022-05-27

## 2021-07-07 ENCOUNTER — Ambulatory Visit (INDEPENDENT_AMBULATORY_CARE_PROVIDER_SITE_OTHER): Payer: Medicare Other | Admitting: Orthopaedic Surgery

## 2021-07-07 ENCOUNTER — Ambulatory Visit (INDEPENDENT_AMBULATORY_CARE_PROVIDER_SITE_OTHER): Payer: Medicare Other

## 2021-07-07 ENCOUNTER — Other Ambulatory Visit: Payer: Self-pay

## 2021-07-07 VITALS — BP 148/77 | HR 76 | Ht 69.0 in | Wt 172.0 lb

## 2021-07-07 DIAGNOSIS — S22080D Wedge compression fracture of T11-T12 vertebra, subsequent encounter for fracture with routine healing: Secondary | ICD-10-CM | POA: Diagnosis not present

## 2021-07-07 NOTE — Progress Notes (Signed)
Office Visit Note   Patient: Edwin Webster           Date of Birth: September 01, 1961           MRN: [id number] Visit Date: 07/07/2021              Requested by: Edwin Webster, [address] [place],  [place] [address] PCP: Edwin Pinto, MD   Assessment & Plan: Visit Diagnoses:  1. Compression fracture of T12 vertebra with routine healing, subsequent encounter     Plan: Calcium, vitamin D continue ambulation.  Avoid heavy lifting.  Return in 1 month repeat x-rays on return.  Follow-Up Instructions: No follow-ups on file.   Orders:  Orders Placed This Encounter  Procedures   XR Thoracic Spine 2 View   No orders of the defined types were placed in this encounter.     Procedures: No procedures performed   Clinical Data: No additional findings.   Subjective: Chief Complaint  Patient presents with   Lower Back - Follow-up    HPI follow-up 50% T12 compression fracture.  Patient feels he is 30% plus improved.  X-rays show no further compression today. Bone density test done.  Z score -1.5.  Onset of symptoms was when he was lifting a sofa.  No myelopathic signs.  Review of Systems 14 point system update unchanged.  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   Objective: Vital Signs: BP (!) 148/77   Pulse 76   Ht '5\' 9"'$  (1.753 m)   Wt 172 lb (78 kg)   BMI 25.40 kg/m   Physical Exam Constitutional:      Appearance: He is well-developed.  HENT:     Head: Normocephalic and atraumatic.     Right Ear: External ear normal.     Left Ear: External ear normal.  Eyes:     Pupils: Pupils are equal, round, and reactive to light.  Neck:     Thyroid: No thyromegaly.     Trachea: No tracheal deviation.  Cardiovascular:     Rate and Rhythm: Normal rate.  Pulmonary:     Effort: Pulmonary effort is normal.     Breath sounds: No wheezing.  Abdominal:     General: Bowel sounds are normal.     Palpations:  Abdomen is soft.  Musculoskeletal:     Cervical back: Neck supple.  Skin:    General: Skin is warm and dry.     Capillary Refill: Capillary refill takes less than 2 seconds.  Neurological:     Mental Status: He is alert and oriented to person, place, and time.  Psychiatric:        Behavior: Behavior normal.        Thought Content: Thought content normal.        Judgment: Judgment normal.    Ortho Exam patient gets from sitting standing no limping.  No atrophy of lower extremities ankle dorsiflexion plantarflexion is strong.  Specialty Comments:  No specialty comments available.  Imaging: No results found.   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   Iron deficiency anemia 03/13/2018  Ventral hernia 10/24/2017   Abnormal glucose 10/11/2015   BMI 24.0-24.9, adult 10/11/2015   Seronegative rheumatoid arthritis ([facility]) 09/02/2014   Gonalgia 07/07/2014   Hyperlipidemia 07/01/2014   Hypertension 07/01/2014   Vitamin D deficiency 07/01/2014   Medication management 07/01/2014   Past Medical History:  Diagnosis Date   Allergy    seasonal allergies   Anemia    Anxiety    on meds   Arthritis    Chest pain, non-cardiac    COPD (chronic obstructive pulmonary disease) (HCC)    past smoker   Counseling regarding goals of care 03/27/2018   Depression    on meds   Diverticulitis    Diverticulosis    Fractured pelvis (HCC)    fall from ladder   GERD (gastroesophageal reflux disease)    on meds   Gout    History of shingles    Hyperlipidemia    on meds   Hypertension    on  meds   Internal hemorrhoids    Iron deficiency anemia 03/13/2018   Kidney stones    Other abnormal glucose 10/11/2015   Rheumatoid arthritis ([facility])    hands   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    Family History  Problem Relation Age of Onset   Hyperlipidemia Mother    COPD Mother    Heart failure Mother    Colon polyps Mother    Breast cancer Mother    Rectal cancer Mother    Hyperlipidemia Brother    Colon polyps Brother    Stroke Maternal Grandfather    COPD Maternal Grandfather        colon   Colon cancer Maternal Grandfather 61   Colon polyps Maternal Grandfather 44   Hypertension Father    Colon polyps Daughter 104       lynch syndrome   Esophageal cancer Neg Hx    Stomach cancer Neg Hx     Past Surgical History:  Procedure Laterality Date   COLONOSCOPY  2016   JMP-suprep(exc)-mild TICS/int hems/normal-5 yr recall   FINGER SURGERY     iron infusion     KNEE SURGERY     right   PACEMAKER IMPLANT N/A 08/10/2020   Procedure: PACEMAKER IMPLANT;  Surgeon: Constance Haw, MD;  Location: [facility];  Service: Cardiovascular;  Laterality: N/A;   ROTATOR CUFF REPAIR  1996   left   WISDOM TOOTH EXTRACTION     Social History   Occupational History   Not on file  Tobacco Use   Smoking status: Former    Packs/day: 2.00    Types: Cigarettes    Quit date: 07/03/2010    Years since quitting: 11.0   Smokeless tobacco: Never  Vaping Use   Vaping Use: Never used  Substance and Sexual Activity   Alcohol use: No    Alcohol/week: 0.0 standard drinks   Drug use: No   Sexual activity: Not on file

## 2021-07-13 ENCOUNTER — Other Ambulatory Visit (HOSPITAL_COMMUNITY): Payer: Self-pay

## 2021-07-19 ENCOUNTER — Other Ambulatory Visit: Payer: Self-pay | Admitting: Hematology & Oncology

## 2021-07-19 ENCOUNTER — Other Ambulatory Visit (HOSPITAL_COMMUNITY): Payer: Self-pay

## 2021-07-19 DIAGNOSIS — C880 Waldenstrom macroglobulinemia: Secondary | ICD-10-CM

## 2021-07-19 MED ORDER — IMBRUVICA 280 MG PO TABS
ORAL_TABLET | ORAL | 3 refills | Status: DC
  Filled 2021-07-19: qty 28, 28d supply, fill #0
  Filled 2021-08-14: qty 28, 28d supply, fill #1
  Filled 2021-09-11: qty 28, 28d supply, fill #2
  Filled 2021-10-13: qty 28, 28d supply, fill #3

## 2021-07-20 ENCOUNTER — Other Ambulatory Visit (HOSPITAL_COMMUNITY): Payer: Self-pay

## 2021-07-21 ENCOUNTER — Other Ambulatory Visit: Payer: Self-pay

## 2021-07-21 ENCOUNTER — Telehealth: Payer: Self-pay | Admitting: *Deleted

## 2021-07-21 ENCOUNTER — Inpatient Hospital Stay: Payer: Medicare Other | Attending: Hematology & Oncology

## 2021-07-21 ENCOUNTER — Encounter: Payer: Self-pay | Admitting: *Deleted

## 2021-07-21 ENCOUNTER — Encounter: Payer: Self-pay | Admitting: Hematology & Oncology

## 2021-07-21 ENCOUNTER — Inpatient Hospital Stay (HOSPITAL_BASED_OUTPATIENT_CLINIC_OR_DEPARTMENT_OTHER): Payer: Medicare Other | Admitting: Hematology & Oncology

## 2021-07-21 VITALS — BP 132/74 | HR 69 | Temp 97.9°F | Resp 17 | Ht 69.0 in | Wt 166.0 lb

## 2021-07-21 DIAGNOSIS — C880 Waldenstrom macroglobulinemia: Secondary | ICD-10-CM

## 2021-07-21 DIAGNOSIS — D509 Iron deficiency anemia, unspecified: Secondary | ICD-10-CM | POA: Diagnosis not present

## 2021-07-21 DIAGNOSIS — D500 Iron deficiency anemia secondary to blood loss (chronic): Secondary | ICD-10-CM

## 2021-07-21 DIAGNOSIS — M810 Age-related osteoporosis without current pathological fracture: Secondary | ICD-10-CM | POA: Diagnosis not present

## 2021-07-21 LAB — CBC WITH DIFFERENTIAL (CANCER CENTER ONLY)
Abs Immature Granulocytes: 0.02 10*3/uL (ref 0.00–0.07)
Basophils Absolute: 0.1 10*3/uL (ref 0.0–0.1)
Basophils Relative: 1 %
Eosinophils Absolute: 0.1 10*3/uL (ref 0.0–0.5)
Eosinophils Relative: 2 %
HCT: 40.1 % (ref 39.0–52.0)
Hemoglobin: 13.5 g/dL (ref 13.0–17.0)
Immature Granulocytes: 0 %
Lymphocytes Relative: 18 %
Lymphs Abs: 0.9 10*3/uL (ref 0.7–4.0)
MCH: 33.1 pg (ref 26.0–34.0)
MCHC: 33.7 g/dL (ref 30.0–36.0)
MCV: 98.3 fL (ref 80.0–100.0)
Monocytes Absolute: 0.6 10*3/uL (ref 0.1–1.0)
Monocytes Relative: 12 %
Neutro Abs: 3.2 10*3/uL (ref 1.7–7.7)
Neutrophils Relative %: 67 %
Platelet Count: 203 10*3/uL (ref 150–400)
RBC: 4.08 MIL/uL — ABNORMAL LOW (ref 4.22–5.81)
RDW: 13 % (ref 11.5–15.5)
WBC Count: 4.8 10*3/uL (ref 4.0–10.5)
nRBC: 0 % (ref 0.0–0.2)

## 2021-07-21 LAB — CMP (CANCER CENTER ONLY)
ALT: 17 U/L (ref 0–44)
AST: 23 U/L (ref 15–41)
Albumin: 4.4 g/dL (ref 3.5–5.0)
Alkaline Phosphatase: 66 U/L (ref 38–126)
Anion gap: 7 (ref 5–15)
BUN: 18 mg/dL (ref 8–23)
CO2: 29 mmol/L (ref 22–32)
Calcium: 9.3 mg/dL (ref 8.9–10.3)
Chloride: 104 mmol/L (ref 98–111)
Creatinine: 1.26 mg/dL — ABNORMAL HIGH (ref 0.61–1.24)
GFR, Estimated: >60 mL/min (ref >60)
Glucose, Bld: 93 mg/dL (ref 70–99)
Potassium: 4 mmol/L (ref 3.5–5.1)
Sodium: 140 mmol/L (ref 135–145)
Total Bilirubin: 0.8 mg/dL (ref 0.3–1.2)
Total Protein: 6.6 g/dL (ref 6.5–8.1)

## 2021-07-21 LAB — IRON AND TIBC
Iron: 87 ug/dL (ref 42–163)
Saturation Ratios: 29 % (ref 20–55)
TIBC: 301 ug/dL (ref 202–409)
UIBC: 214 ug/dL (ref 117–376)

## 2021-07-21 LAB — LACTATE DEHYDROGENASE: LDH: 152 U/L (ref 98–192)

## 2021-07-21 LAB — FERRITIN: Ferritin: 178 ng/mL (ref 24–336)

## 2021-07-21 NOTE — Telephone Encounter (Signed)
Per 07/21/21 LOS - gave upcoming appointments - confirmed - print calendar

## 2021-07-21 NOTE — Progress Notes (Signed)
Hematology and Oncology Follow Up Visit  NECALLI STOUP [id number] 17-Dec-1960 60 y.o. 07/21/2021   Principle Diagnosis:  Waldenstrom's macroglobulinemia Iron deficiency anemia  Current Therapy:   Rituxan/bendamustine-s/p cycle 2 -- d/c on 06/11/2018 Imbruvica  280 mg po q day -- changed on 08/06/2019 IV iron as indicated - last received in March 2022     Interim History:  [name] is here today for follow-up.  Unfortunately, has been quite busy.  He has a couple compression fractures in his lower back.  This happened when he was helping to move his daughter into a new house.  He has osteoporosis on a bone density test.  He is on vitamin D now.  He is also on Fosamax.  He has a pacemaker in.  He sees a cardiologist in a month.  The Waldenstrom's seems to be doing fairly well.  His last M spike back in May was 0.3 g/dL.  The IgM level was 452 mg/dL.  This is holding pretty stable.  He has had no problem with fever.  There is no cough.  He has had no bleeding.  There has been no change in bowel or bladder habits.  He has had no leg swelling.  He is still trying to stay active.  His appetite has been doing pretty well.  He has had no nausea or vomiting.  Overall, I would say his performance status is probably ECOG 1.  .  Medications:  Allergies as of 07/21/2021       Reactions   Adalimumab Other (See Comments)   "blacked out", confused        Medication List        Accurate as of July 21, 2021 11:20 AM. If you have any questions, ask your nurse or doctor.          STOP taking these medications    AMBULATORY NON FORMULARY MEDICATION Stopped by: [name], MD   busPIRone 5 MG tablet Commonly known as: BUSPAR Stopped by: [name], MD   fluticasone 50 MCG/ACT nasal spray Commonly known as: FLONASE Stopped by: [name], MD   gabapentin 300 MG capsule Commonly known as: NEURONTIN Stopped by: [name], MD   meloxicam 15 MG  tablet Commonly known as: MOBIC Stopped by: [name], MD   tiZANidine 4 MG tablet Commonly known as: ZANAFLEX Stopped by: [name], MD   traMADol 50 MG tablet Commonly known as: ULTRAM Stopped by: [name], MD   valACYclovir 1000 MG tablet Commonly known as: Valtrex Stopped by: [name], MD       TAKE these medications    alendronate 70 MG tablet Commonly known as: Fosamax Take 1 tablet (70 mg total) by mouth every 7 (seven) days. Take with a full glass of water on an empty stomach and avoid reclining or lying down for 2 hours after taking this medication.   aspirin EC 81 MG tablet Take 81 mg daily by mouth.   escitalopram 20 MG tablet Commonly known as: LEXAPRO TAKE 1 TABLET BY MOUTH DAILY FOR MOOD   ezetimibe 10 MG tablet Commonly known as: ZETIA TAKE 1 TABLET BY MOUTH EVERY DAY   famciclovir 500 MG tablet Commonly known as: FAMVIR TAKE 1 TABLET BY MOUTH EVERY DAY   famotidine 40 MG tablet Commonly known as: PEPCID Take 1 tabet Daily for Heartburn and Indigestion   folic acid 1 MG tablet Commonly known as: FOLVITE TAKE 1 TABLET  DAILY FOR FOLATE DEFICIENCY   Imbruvica 280 MG Tabs Generic drug: ibrutinib TAKE 1 TABLET (280 MG) BY MOUTH DAILY.   ipratropium 0.06 % nasal spray Commonly known as: ATROVENT Use 1 to 2 sprays each nostril 2 to 3 x /day as needed   levocetirizine 5 MG tablet Commonly known as: XYZAL TAKE 1 TABLET BY MOUTH DAILY FOR ALLERGIES   olmesartan 40 MG tablet Commonly known as: BENICAR Take 40 mg by mouth at bedtime. For BP   rosuvastatin 40 MG tablet Commonly known as: CRESTOR TAKE 1 TABLET BY MOUTH EVERY DAY FOR CHOLESTEROL   vitamin B-12 500 MCG tablet Commonly known as: CYANOCOBALAMIN Take 500 mcg by mouth daily.   Vitamin D-3 125 MCG (5000 UT) Tabs Take 5,000 Units by mouth 2 (two) times daily.        Allergies:  Allergies  Allergen Reactions   Adalimumab Other (See Comments)     "blacked out", confused     Past Medical History, Surgical history, Social history, and Family History were reviewed and updated.  Review of Systems: Review of Systems  Constitutional: Negative.   HENT: Negative.    Eyes: Negative.   Respiratory: Negative.    Cardiovascular: Negative.   Gastrointestinal: Negative.   Genitourinary: Negative.   Musculoskeletal: Negative.   Skin: Negative.   Neurological: Negative.   Endo/Heme/Allergies: Negative.   Psychiatric/Behavioral: Negative.       Physical Exam:  height is '5\' 9"'$  (1.753 m) and weight is 166 lb (75.3 kg). His oral temperature is 97.9 F (36.6 C). His blood pressure is 132/74 and his pulse is 69. His respiration is 17 and oxygen saturation is 98%.   Wt Readings from Last 3 Encounters:  07/21/21 166 lb (75.3 kg)  07/07/21 172 lb (78 kg)  06/05/21 172 lb (78 kg)  Well then  Physical Exam Vitals reviewed.  HENT:     Head: Normocephalic and atraumatic.  Eyes:     Pupils: Pupils are equal, round, and reactive to light.  Cardiovascular:     Rate and Rhythm: Normal rate and regular rhythm.     Heart sounds: Normal heart sounds.  Pulmonary:     Effort: Pulmonary effort is normal.     Breath sounds: Normal breath sounds.  Abdominal:     General: Bowel sounds are normal.     Palpations: Abdomen is soft.  Musculoskeletal:        General: No tenderness or deformity. Normal range of motion.     Cervical back: Normal range of motion.  Lymphadenopathy:     Cervical: No cervical adenopathy.  Skin:    General: Skin is warm and dry.     Findings: No erythema or rash.  Neurological:     Mental Status: He is alert and oriented to person, place, and time.  Psychiatric:        Behavior: Behavior normal.        Thought Content: Thought content normal.        Judgment: Judgment normal.     Lab Results  Component Value Date   WBC 4.8 07/21/2021   HGB 13.5 07/21/2021   HCT 40.1 07/21/2021   MCV 98.3 07/21/2021   PLT 203  07/21/2021   Lab Results  Component Value Date   FERRITIN 251 04/18/2021   IRON 97 04/18/2021   TIBC 278 04/18/2021   UIBC 180 04/18/2021   IRONPCTSAT 35 04/18/2021   Lab Results  Component Value Date   RETICCTPCT 0.9 02/12/2018  RBC 4.08 (L) 07/21/2021   RETICCTABS 33,120 02/12/2018   Lab Results  Component Value Date   KPAFRELGTCHN 24.8 (H) 04/18/2021   LAMBDASER 9.7 04/18/2021   KAPLAMBRATIO 2.56 (H) 04/18/2021   Lab Results  Component Value Date   IGGSERUM 423 (L) 04/18/2021   IGA 35 (L) 04/18/2021   IGMSERUM 452 (H) 04/18/2021   Lab Results  Component Value Date   TOTALPROTELP 6.3 04/18/2021   ALBUMINELP 4.0 04/18/2021   A1GS 0.2 04/18/2021   A2GS 0.8 04/18/2021   BETS 0.7 04/18/2021   BETA2SER 0.2 02/18/2018   GAMS 0.6 04/18/2021   MSPIKE 0.3 (H) 04/18/2021   SPEI Comment 07/09/2018     Chemistry      Component Value Date/Time   NA 140 07/21/2021 0953   NA 141 08/04/2020 1203   K 4.0 07/21/2021 0953   CL 104 07/21/2021 0953   CO2 29 07/21/2021 0953   BUN 18 07/21/2021 0953   BUN 13 08/04/2020 1203   CREATININE 1.26 (H) 07/21/2021 0953   CREATININE 1.18 03/14/2021 1104      Component Value Date/Time   CALCIUM 9.3 07/21/2021 0953   ALKPHOS 66 07/21/2021 0953   AST 23 07/21/2021 0953   ALT 17 07/21/2021 0953   BILITOT 0.8 07/21/2021 0953      Impression and Plan: Mr. Hoock is a very pleasant 60 yo caucasian gentleman with Waldenstrom's macroglobulinemia.   I feel bad about the osteoporosis and the compression fractures.  Hopefully, the Fosamax and vitamin D will help heal up the fractures.  His pacemaker seems to be working well with respect to his heart and his rhythm.  Overall, the Waldenstrom's seems to be at a very low level.  We will see what the numbers look like with his next Waldenstrom's studies.  Overall, I will try to get him back in another 2 or 3 months.  I want to see him before the Thanksgiving holiday.     [name],  MD 8/26/202211:20 AM

## 2021-07-22 LAB — IGG, IGA, IGM
IgA: 34 mg/dL — ABNORMAL LOW (ref 61–437)
IgG (Immunoglobin G), Serum: 408 mg/dL — ABNORMAL LOW (ref 603–1613)
IgM (Immunoglobulin M), Srm: 388 mg/dL — ABNORMAL HIGH (ref 20–172)

## 2021-07-24 LAB — KAPPA/LAMBDA LIGHT CHAINS
Kappa free light chain: 24.3 mg/L — ABNORMAL HIGH (ref 3.3–19.4)
Kappa, lambda light chain ratio: 2.13 — ABNORMAL HIGH (ref 0.26–1.65)
Lambda free light chains: 11.4 mg/L (ref 5.7–26.3)

## 2021-07-27 ENCOUNTER — Encounter: Payer: Self-pay | Admitting: *Deleted

## 2021-07-27 LAB — IMMUNOFIXATION REFLEX, SERUM
IgA: 36 mg/dL — ABNORMAL LOW (ref 61–437)
IgG (Immunoglobin G), Serum: 403 mg/dL — ABNORMAL LOW (ref 603–1613)
IgM (Immunoglobulin M), Srm: 412 mg/dL — ABNORMAL HIGH (ref 20–172)

## 2021-07-27 LAB — PROTEIN ELECTROPHORESIS, SERUM, WITH REFLEX
A/G Ratio: 1.7 (ref 0.7–1.7)
Albumin ELP: 3.8 g/dL (ref 2.9–4.4)
Alpha-1-Globulin: 0.2 g/dL (ref 0.0–0.4)
Alpha-2-Globulin: 0.6 g/dL (ref 0.4–1.0)
Beta Globulin: 0.7 g/dL (ref 0.7–1.3)
Gamma Globulin: 0.6 g/dL (ref 0.4–1.8)
Globulin, Total: 2.2 g/dL (ref 2.2–3.9)
M-Spike, %: 0.3 g/dL — ABNORMAL HIGH
SPEP Interpretation: 0
Total Protein ELP: 6 g/dL (ref 6.0–8.5)

## 2021-08-08 ENCOUNTER — Ambulatory Visit: Payer: Medicare Other | Admitting: Orthopaedic Surgery

## 2021-08-10 ENCOUNTER — Ambulatory Visit (INDEPENDENT_AMBULATORY_CARE_PROVIDER_SITE_OTHER): Payer: Medicare Other

## 2021-08-10 DIAGNOSIS — I495 Sick sinus syndrome: Secondary | ICD-10-CM | POA: Diagnosis not present

## 2021-08-11 LAB — CUP PACEART REMOTE DEVICE CHECK
Battery Remaining Longevity: 119 mo
Battery Remaining Percentage: 95 %
Battery Voltage: 3.01 V
Brady Statistic AP VP Percent: 1 %
Brady Statistic AP VS Percent: 66 %
Brady Statistic AS VP Percent: 1 %
Brady Statistic AS VS Percent: 34 %
Brady Statistic RA Percent Paced: 66 %
Brady Statistic RV Percent Paced: 1 %
Date Time Interrogation Session: 20220915020015
Implantable Lead Implant Date: 20210915
Implantable Lead Implant Date: 20210915
Implantable Lead Location: 753859
Implantable Lead Location: 753860
Implantable Pulse Generator Implant Date: 20210915
Lead Channel Impedance Value: 440 Ohm
Lead Channel Impedance Value: 560 Ohm
Lead Channel Pacing Threshold Amplitude: 0.625 V
Lead Channel Pacing Threshold Amplitude: 0.75 V
Lead Channel Pacing Threshold Pulse Width: 0.5 ms
Lead Channel Pacing Threshold Pulse Width: 0.5 ms
Lead Channel Sensing Intrinsic Amplitude: 10.8 mV
Lead Channel Sensing Intrinsic Amplitude: 4 mV
Lead Channel Setting Pacing Amplitude: 1 V
Lead Channel Setting Pacing Amplitude: 1.625
Lead Channel Setting Pacing Pulse Width: 0.5 ms
Lead Channel Setting Sensing Sensitivity: 2 mV
Pulse Gen Model: 2272
Pulse Gen Serial Number: 3864545

## 2021-08-14 ENCOUNTER — Other Ambulatory Visit (HOSPITAL_COMMUNITY): Payer: Self-pay

## 2021-08-16 ENCOUNTER — Other Ambulatory Visit (HOSPITAL_COMMUNITY): Payer: Self-pay

## 2021-08-17 NOTE — Progress Notes (Signed)
Remote pacemaker transmission.   

## 2021-08-18 ENCOUNTER — Ambulatory Visit: Payer: Self-pay

## 2021-08-18 ENCOUNTER — Encounter: Payer: Self-pay | Admitting: Orthopaedic Surgery

## 2021-08-18 ENCOUNTER — Ambulatory Visit (INDEPENDENT_AMBULATORY_CARE_PROVIDER_SITE_OTHER): Payer: Medicare Other | Admitting: Orthopaedic Surgery

## 2021-08-18 VITALS — BP 146/80 | HR 77 | Ht 69.0 in | Wt 166.0 lb

## 2021-08-18 DIAGNOSIS — S22080D Wedge compression fracture of T11-T12 vertebra, subsequent encounter for fracture with routine healing: Secondary | ICD-10-CM | POA: Diagnosis not present

## 2021-08-18 NOTE — Progress Notes (Signed)
Office Visit Note   Patient: Edwin Webster           Date of Birth: July 27, 1961           MRN: [id number] Visit Date: 08/18/2021              Requested by: Unk Pinto, [address] [place],  [place] [address] PCP: Unk Pinto, MD   Assessment & Plan: Visit Diagnoses:  1. Compression fracture of T12 vertebra with routine healing, subsequent encounter     Plan: X-rays stable he has minimal symptoms.  He is released from care and can follow-up on an as-needed basis.  Fall prevention discussed.  Return as needed.  Follow-Up Instructions: No follow-ups on file.   Orders:  Orders Placed This Encounter  Procedures   XR Thoracic Spine 2 View   No orders of the defined types were placed in this encounter.     Procedures: No procedures performed   Clinical Data: No additional findings.   Subjective: Chief Complaint  Patient presents with   T12 compression fracture follow up    HPI follow-up T12 compression fracture.  He is on calcium and vitamin D.  Sometimes when he is out in the yard pulling around a little too much she has little bit aching.  Some stiffness in the morning.  No myelopathic symptoms.  First visit with me was in June and fracture was in May.  Review of Systems   Objective: Vital Signs: BP (!) 146/80   Pulse 77   Ht 5\' 9"  (1.753 m)   Wt 166 lb (75.3 kg)   BMI 24.51 kg/m   Physical Exam  Ortho Exam  Specialty Comments:  No specialty comments available.  Imaging: No results found.   PMFS History: Patient Active Problem List   Diagnosis Date Noted   Osteoporosis 06/26/2021   Thoracic compression fracture ([facility]) 05/25/2021   Compression fracture of T12 vertebra ([facility]) 05/18/2021   History of basal cell cancer 12/06/2020   Sinus bradycardia 07/15/2020   Atypical chest pain 07/15/2020   Major depressive disorder, recurrent episode, mild with anxious distress ([facility]) 08/21/2018   Waldenstrom's macroglobulinemia  ([facility]) 03/27/2018   Iron deficiency anemia 03/13/2018   Ventral hernia 10/24/2017   Abnormal glucose 10/11/2015   BMI 24.0-24.9, adult 10/11/2015   Seronegative rheumatoid arthritis ([facility]) 09/02/2014   Gonalgia 07/07/2014   Hyperlipidemia 07/01/2014   Hypertension 07/01/2014   Vitamin D deficiency 07/01/2014   Medication management 07/01/2014   Past Medical History:  Diagnosis Date   Allergy    seasonal allergies   Anemia    Anxiety    on meds   Arthritis    Chest pain, non-cardiac    COPD (chronic obstructive pulmonary disease) ([facility])    past smoker   Counseling regarding goals of care 03/27/2018   Depression    on meds   Diverticulitis    Diverticulosis    Fractured pelvis (HCC)    fall from ladder   GERD (gastroesophageal reflux disease)    on meds   Gout    History of shingles    Hyperlipidemia    on meds   Hypertension    on meds   Internal hemorrhoids    Iron deficiency anemia 03/13/2018   Kidney stones    Other abnormal glucose 10/11/2015   Rheumatoid arthritis ([facility])    hands   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    Family History  Problem Relation Age of Onset  Hyperlipidemia Mother    COPD Mother    Heart failure Mother    Colon polyps Mother    Breast cancer Mother    Rectal cancer Mother    Hyperlipidemia Brother    Colon polyps Brother    Stroke Maternal Grandfather    COPD Maternal Grandfather        colon   Colon cancer Maternal Grandfather 27   Colon polyps Maternal Grandfather 33   Hypertension Father    Colon polyps Daughter 47       lynch syndrome   Esophageal cancer Neg Hx    Stomach cancer Neg Hx     Past Surgical History:  Procedure Laterality Date   COLONOSCOPY  2016   JMP-suprep(exc)-mild TICS/int hems/normal-5 yr recall   FINGER SURGERY     iron infusion     KNEE SURGERY     right   PACEMAKER IMPLANT N/A 08/10/2020   Procedure: PACEMAKER IMPLANT;  Surgeon: Constance Haw, MD;  Location: [facility];   Service: Cardiovascular;  Laterality: N/A;   ROTATOR CUFF REPAIR  1996   left   WISDOM TOOTH EXTRACTION     Social History   Occupational History   Not on file  Tobacco Use   Smoking status: Former    Packs/day: 2.00    Types: Cigarettes    Quit date: 07/03/2010    Years since quitting: 11.1   Smokeless tobacco: Never  Vaping Use   Vaping Use: Never used  Substance and Sexual Activity   Alcohol use: No    Alcohol/week: 0.0 standard drinks   Drug use: No   Sexual activity: Not on file

## 2021-08-26 ENCOUNTER — Other Ambulatory Visit: Payer: Self-pay | Admitting: Hematology & Oncology

## 2021-08-26 DIAGNOSIS — B029 Zoster without complications: Secondary | ICD-10-CM

## 2021-08-28 ENCOUNTER — Encounter: Payer: Self-pay | Admitting: Family

## 2021-08-31 ENCOUNTER — Ambulatory Visit (INDEPENDENT_AMBULATORY_CARE_PROVIDER_SITE_OTHER): Payer: Medicare Other | Admitting: Cardiology

## 2021-08-31 ENCOUNTER — Encounter: Payer: Self-pay | Admitting: Cardiology

## 2021-08-31 ENCOUNTER — Other Ambulatory Visit: Payer: Self-pay

## 2021-08-31 VITALS — BP 122/70 | HR 60 | Ht 69.0 in | Wt 168.0 lb

## 2021-08-31 DIAGNOSIS — R001 Bradycardia, unspecified: Secondary | ICD-10-CM

## 2021-08-31 DIAGNOSIS — Z950 Presence of cardiac pacemaker: Secondary | ICD-10-CM | POA: Diagnosis not present

## 2021-08-31 DIAGNOSIS — I495 Sick sinus syndrome: Secondary | ICD-10-CM

## 2021-08-31 NOTE — Patient Instructions (Signed)
Medication Instructions:  Your physician recommends that you continue on your current medications as directed. Please refer to the Current Medication list given to you today.  *If you need a refill on your cardiac medications before your next appointment, please call your pharmacy*   Lab Work: None ordered.  If you have labs (blood work) drawn today and your tests are completely normal, you will receive your results only by: [facility] (if you have MyChart) OR A paper copy in the mail If you have any lab test that is abnormal or we need to change your treatment, we will call you to review the results.   Testing/Procedures: None ordered.    Follow-Up: At [facility], you and your health needs are our priority.  As part of our continuing mission to provide you with exceptional heart care, we have created designated Provider Care Teams.  These Care Teams include your primary Cardiologist (physician) and Advanced Practice Providers (APPs -  Physician Assistants and Nurse Practitioners) who all work together to provide you with the care you need, when you need it.  We recommend signing up for the patient portal called "MyChart".  Sign up information is provided on this After Visit Summary.  MyChart is used to connect with patients for Virtual Visits (Telemedicine).  Patients are able to view lab/test results, encounter notes, upcoming appointments, etc.  Non-urgent messages can be sent to your provider as well.   To learn more about what you can do with MyChart, go to [email or web address].    Your next appointment:   12 month(s)  The format for your next appointment:   In Person  Provider:   Allegra Lai, MD

## 2021-08-31 NOTE — Progress Notes (Signed)
Electrophysiology Office Note   Date:  08/31/2021   ID:  Edwin Webster, DOB 02-27-1961, MRN [id number]  PCP:  Edwin Pinto, MD  Cardiologist:  Edwin Webster Primary Electrophysiologist:  Edwin Kozma Meredith Leeds, MD    Chief Complaint: palpitations   History of Present Illness: Edwin Webster is a 59 y.o. male who is being seen today for the evaluation of SVT at the request of Edwin Pinto, MD. Presenting today for electrophysiology evaluation.  He has a history significant for COPD, hyperlipidemia, Waldenstrom's macroglobulinemia.  He wore a 2-week ZIO monitor that showed sinus bradycardia with heart rates in the 30s and 40s.  This occurred during daytime hours.  He complains of dyspnea and fatigue.  He is status post [employer]rill dual-chamber pacemaker implanted 08/10/2020.  Today, denies symptoms of palpitations, chest pain, shortness of breath, orthopnea, PND, lower extremity edema, claudication, dizziness, presyncope, syncope, bleeding, or neurologic sequela. The patient is tolerating medications without difficulties.  Since being seen he has done well.  He has no chest pain or shortness of breath.  Is able to do all of his daily activities without restriction.  He feels much better with heart rates faster than the 30s.  He has much more energy.  He has had some short episodes of atrial fibrillation though he has been unaware.   Past Medical History:  Diagnosis Date   Allergy    seasonal allergies   Anemia    Anxiety    on meds   Arthritis    Chest pain, non-cardiac    COPD (chronic obstructive pulmonary disease) ([place])    past smoker   Counseling regarding goals of care 03/27/2018   Depression    on meds   Diverticulitis    Diverticulosis    Fractured pelvis ([place])    fall from ladder   GERD (gastroesophageal reflux disease)    on meds   Gout    History of shingles    Hyperlipidemia    on meds   Hypertension    on meds   Internal hemorrhoids    Iron deficiency anemia  03/13/2018   Kidney stones    Osteoporosis    Other abnormal glucose 10/11/2015   Rheumatoid arthritis ([place])    hands   Waldenstrom's macroglobulinemia ([place]) 03/27/2018   Past Surgical History:  Procedure Laterality Date   COLONOSCOPY  2016   JMP-suprep(exc)-mild TICS/int hems/normal-5 yr recall   FINGER SURGERY     iron infusion     KNEE SURGERY     right   PACEMAKER IMPLANT N/A 08/10/2020   Procedure: PACEMAKER IMPLANT;  Surgeon: Constance Haw, MD;  Location: [facility];  Service: Cardiovascular;  Laterality: N/A;   ROTATOR CUFF REPAIR  1996   left   WISDOM TOOTH EXTRACTION       Current Outpatient Medications  Medication Sig Dispense Refill   alendronate (FOSAMAX) 70 MG tablet Take 1 tablet (70 mg total) by mouth every 7 (seven) days. Take with a full glass of water on an empty stomach and avoid reclining or lying down for 2 hours after taking this medication. 12 tablet 3   aspirin EC 81 MG tablet Take 81 mg daily by mouth.     Cholecalciferol (VITAMIN D-3) 5000 UNITS TABS Take 5,000 Units by mouth 2 (two) times daily.      escitalopram (LEXAPRO) 20 MG tablet TAKE 1 TABLET BY MOUTH DAILY FOR MOOD 90 tablet 3   ezetimibe (ZETIA) 10 MG tablet TAKE 1  TABLET BY MOUTH EVERY DAY 90 tablet 3   famciclovir (FAMVIR) 500 MG tablet TAKE 1 TABLET BY MOUTH EVERY DAY 90 tablet 3   famotidine (PEPCID) 40 MG tablet Take 1 tabet Daily for Heartburn and Indigestion 90 tablet 3   folic acid (FOLVITE) 1 MG tablet TAKE 1 TABLET DAILY FOR FOLATE DEFICIENCY 90 tablet 3   ibrutinib (IMBRUVICA) 280 MG TABS TAKE 1 TABLET (280 MG) BY MOUTH DAILY. 28 tablet 3   ipratropium (ATROVENT) 0.06 % nasal spray Use 1 to 2 sprays each nostril 2 to 3 x /day as needed 45 mL 3   levocetirizine (XYZAL) 5 MG tablet TAKE 1 TABLET BY MOUTH DAILY FOR ALLERGIES 90 tablet 3   olmesartan (BENICAR) 40 MG tablet Take 40 mg by mouth at bedtime. For BP     rosuvastatin (CRESTOR) 40 MG tablet TAKE 1 TABLET BY MOUTH  EVERY DAY FOR CHOLESTEROL 90 tablet 3   vitamin B-12 (CYANOCOBALAMIN) 500 MCG tablet Take 500 mcg by mouth daily.     No current facility-administered medications for this visit.    Allergies:   Adalimumab   Social History:  The patient  reports that he quit smoking about 11 years ago. His smoking use included cigarettes. He smoked an average of 2 packs per day. He has never used smokeless tobacco. He reports that he does not drink alcohol and does not use drugs.   Family History:  The patient's family history includes Breast cancer in his mother; COPD in his maternal grandfather and mother; Colon cancer (age of onset: 89) in his maternal grandfather; Colon polyps in his brother and mother; Colon polyps (age of onset: 74) in his daughter; Colon polyps (age of onset: 6) in his maternal grandfather; Heart failure in his mother; Hyperlipidemia in his brother and mother; Hypertension in his father; Rectal cancer in his mother; Stroke in his maternal grandfather.   ROS:  Please see the history of present illness.   Otherwise, review of systems is positive for none.   All other systems are reviewed and negative.   PHYSICAL EXAM: VS:  BP 122/70   Pulse 60   Ht 5\' 9"  (1.753 m)   Wt 168 lb (76.2 kg)   SpO2 98%   BMI 24.81 kg/m  , BMI Body mass index is 24.81 kg/m. GEN: Well nourished, well developed, in no acute distress  HEENT: normal  Neck: no JVD, carotid bruits, or masses Cardiac: RRR; no murmurs, rubs, or gallops,no edema  Respiratory:  clear to auscultation bilaterally, normal work of breathing GI: soft, nontender, nondistended, + BS MS: no deformity or atrophy  Skin: warm and dry, device site well healed Neuro:  Strength and sensation are intact Psych: euthymic mood, full affect  EKG:  EKG is ordered today. Personal review of the ekg ordered shows atrial paced, rate 60  Personal review of the device interrogation today. Results in [facility]: 03/14/2021: Magnesium  2.2; TSH 2.34 07/21/2021: ALT 17; BUN 18; Creatinine 1.26; Hemoglobin 13.5; Platelet Count 203; Potassium 4.0; Sodium 140    Lipid Panel     Component Value Date/Time   CHOL 182 03/14/2021 1104   CHOL 118 10/06/2020 0827   TRIG 58 03/14/2021 1104   HDL 50 03/14/2021 1104   HDL 41 10/06/2020 0827   CHOLHDL 3.6 03/14/2021 1104   VLDL 12 04/16/2017 1712   LDLCALC 118 (H) 03/14/2021 1104     Wt Readings from Last 3 Encounters:  08/31/21 168 lb (  76.2 kg)  08/18/21 166 lb (75.3 kg)  07/21/21 166 lb (75.3 kg)      Other studies Reviewed: Additional studies/ records that were reviewed today include: TTE 07/27/20  Review of the above records today demonstrates:   1. Left ventricular ejection fraction, by estimation, is 55 to 60%. The  left ventricle has normal function. The left ventricle has no regional  wall motion abnormalities. Left ventricular diastolic parameters were  normal.   2. Right ventricular systolic function is normal. The right ventricular  size is normal.   3. The mitral valve is normal in structure. No evidence of mitral valve  regurgitation. No evidence of mitral stenosis.   4. The aortic valve is normal in structure. Aortic valve regurgitation is  mild. Mild aortic valve sclerosis is present, with no evidence of aortic  valve stenosis.   5. The inferior vena cava is normal in size with greater than 50%  respiratory variability, suggesting right atrial pressure of 3 mmHg.   Monitor 07/10/20 personally reviewed 1. SR/SB/ST 2. Occasional PVCs/PACs 3. Short runs of SVT  ASSESSMENT AND PLAN:  1.  Sick sinus syndrome: Status post [employer] Jude dual-chamber pacemaker implanted 08/10/2020.  Device functioning appropriately.  No changes at this time.  2.  Hypertension: Currently well controlled  3.  Hyperlipidemia: Continue Crestor per primary cardiology.  4.  Paroxysmal atrial fibrillation: Webster on device interrogation.  CHA2DS2-VASc of 1.  He has had short episodes  and thus does not need anticoagulation at this time.  Current medicines are reviewed at length with the patient today.   The patient does not have concerns regarding his medicines.  The following changes were made today: None  Labs/ tests ordered today include:  Orders Placed This Encounter  Procedures   EKG 12-Lead      Disposition:   FU with K[place]anna Kasprzak 12 months  Signed, Kaeden Mester Meredith Leeds, MD  08/31/2021 3:05 PM     [facility] [address] [place] [address] [phone] (office) [phone] (fax)

## 2021-09-11 ENCOUNTER — Other Ambulatory Visit (HOSPITAL_COMMUNITY): Payer: Self-pay

## 2021-09-13 ENCOUNTER — Other Ambulatory Visit (HOSPITAL_COMMUNITY): Payer: Self-pay

## 2021-09-17 ENCOUNTER — Encounter: Payer: Self-pay | Admitting: Internal Medicine

## 2021-09-17 NOTE — Patient Instructions (Signed)
Due to recent changes in healthcare laws, you may see the results of your imaging and laboratory studies on MyChart before your provider has had a chance to review them.  We understand that in some cases there may be results that are confusing or concerning to you. Not all laboratory results come back in the same time frame and the provider may be waiting for multiple results in order to interpret others.  Please give us 48 hours in order for your provider to thoroughly review all the results before contacting the office for clarification of your results.   +++++++++++++++++++++++++++++++  Vit D  & Vit C 1,000 mg   are recommended to help protect  against the Covid-19 and other Corona viruses.    Also it's recommended  to take  Zinc 50 mg  to help  protect against the Covid-19   and best place to get  is also on [employer]  and don't pay more than 6-8 cents /pill !  ================================ Coronavirus (COVID-19) Are you at risk?  Are you at risk for the Coronavirus (COVID-19)?  To be considered HIGH RISK for Coronavirus (COVID-19), you have to meet the following criteria:  Traveled to [place], [place], [place], [place] or [place]; or in the [place] to [place], [place], [place]  or [place]; and have fever, cough, and shortness of breath within the last 2 weeks of travel OR Been in close contact with a person diagnosed with COVID-19 within the last 2 weeks and have  fever, cough,and shortness of breath  IF YOU DO NOT MEET THESE CRITERIA, YOU ARE CONSIDERED LOW RISK FOR COVID-19.  What to do if you are HIGH RISK for COVID-19?  If you are having a medical emergency, call 911. Seek medical care right away. Before you go to a doctor's office, urgent care or emergency department,  call ahead and tell them about your recent travel, contact with someone diagnosed with COVID-19   and your symptoms.  You should receive instructions from your physician's office regarding  next steps of care.  When you arrive at healthcare provider, tell the healthcare staff immediately you have returned from  visiting [place], [place], [place], [place] or [place]; or traveled in the [place] to [place], [place],  [place] or [place] in the last two weeks or you have been in close contact with a person diagnosed with  COVID-19 in the last 2 weeks.   Tell the health care staff about your symptoms: fever, cough and shortness of breath. After you have been seen by a medical provider, you will be either: Tested for (COVID-19) and discharged home on quarantine except to seek medical care if  symptoms worsen, and asked to  Stay home and avoid contact with others until you get your results (4-5 days)  Avoid travel on public transportation if possible (such as bus, train, or airplane) or Sent to the Emergency Department by EMS for evaluation, COVID-19 testing  and  possible admission depending on your condition and test results.  What to do if you are LOW RISK for COVID-19?  Reduce your risk of any infection by using the same precautions used for avoiding the common cold or flu:  Wash your hands often with soap and warm water for at least 20 seconds.  If soap and water are not readily available,  use an alcohol-based hand sanitizer with at least 60% alcohol.  If coughing or sneezing, cover your mouth and nose by coughing   or sneezing into the elbow areas of your shirt or coat,  into a tissue or into your sleeve (not your hands). Avoid shaking hands with others and consider head nods or verbal greetings only. Avoid touching your eyes, nose, or mouth with unwashed hands.  Avoid close contact with people who are sick. Avoid places or events with large numbers of people in one location, like concerts or sporting events. Carefully consider travel plans you have or are making. If you are planning any travel outside or inside the [place], visit the CDC's Travelers' Health webpage for  the latest health notices. If you have some symptoms but not all symptoms, continue to monitor at home and seek medical attention  if your symptoms worsen. If you are having a medical emergency, call 911. >>>>>>>>>>>>>>>>>>>>>>>>>>>>>>>>>>>>>>>>>>>>>>>>>>> We Do NOT Approve of [employer] > > > > > > > > > > > > > > > > > > > > > > > > > > > > > > > > > > >  > >    Preventive Care for Adults  A healthy lifestyle and preventive care can promote health and wellness. Preventive health guidelines for men include the following key practices: A routine yearly physical is a good way to check with your health care provider about your health and preventative screening. It is a chance to share any concerns and updates on your health and to receive a thorough exam. Visit your dentist for a routine exam and preventative care every 6 months. Brush your teeth twice a day and floss once a day. Good oral hygiene prevents tooth decay and gum disease. The frequency of eye exams is based on your age, health, family medical history, use of contact lenses, and other factors. Follow your health care provider's recommendations for frequency of eye exams. Eat a healthy diet. Foods such as vegetables, fruits, whole grains, low-fat dairy products, and lean protein foods contain the nutrients you need without too many calories. Decrease your intake of foods high in solid fats, added sugars, and salt. Eat the right amount of calories for you. Get information about a proper diet from your health care provider, if necessary. Regular physical exercise is one of the most important things you can do for your health. Most adults should get at least 150 minutes of moderate-intensity exercise (any activity that increases your heart rate and causes you to sweat) each week. In addition, most adults need muscle-strengthening exercises on 2 or more days a week. Maintain a healthy weight. The body mass index (BMI) is a screening  tool to identify possible weight problems. It provides an estimate of body fat based on height and weight. Your health care provider can find your BMI and can help you achieve or maintain a healthy weight. For adults 20 years and older: A BMI below 18.5 is considered underweight. A BMI of 18.5 to 24.9 is normal. A BMI of 25 to 29.9 is considered overweight. A BMI of 30 and above is considered obese. Maintain normal blood lipids and cholesterol levels by exercising and minimizing your intake of saturated fat. Eat a balanced diet with plenty of fruit and vegetables. Blood tests for lipids and cholesterol should begin at age 20 and be repeated every 5 years. If your lipid or cholesterol levels are high, you are over 50, or you are at high risk for heart disease, you may need your cholesterol levels checked more frequently. Ongoing high lipid and cholesterol levels should be   treated with medicines if diet and exercise are not working. If you smoke, find out from your health care provider how to quit. If you do not use tobacco, do not start. Lung cancer screening is recommended for adults aged 55-80 years who are at high risk for developing lung cancer because of a history of smoking. A yearly low-dose CT scan of the lungs is recommended for people who have at least a 30-pack-year history of smoking and are a current smoker or have quit within the past 15 years. A pack year of smoking is smoking an average of 1 pack of cigarettes a day for 1 year (for example: 1 pack a day for 30 years or 2 packs a day for 15 years). Yearly screening should continue until the smoker has stopped smoking for at least 15 years. Yearly screening should be stopped for people who develop a health problem that would prevent them from having lung cancer treatment. If you choose to drink alcohol, do not have more than 2 drinks per day. One drink is considered to be 12 ounces (355 mL) of beer, 5 ounces (148 mL) of wine, or 1.5 ounces (44  mL) of liquor. Avoid use of street drugs. Do not share needles with anyone. Ask for help if you need support or instructions about stopping the use of drugs. High blood pressure causes heart disease and increases the risk of stroke. Your blood pressure should be checked at least every 1-2 years. Ongoing high blood pressure should be treated with medicines, if weight loss and exercise are not effective. If you are 45-79 years old, ask your health care provider if you should take aspirin to prevent heart disease. Diabetes screening involves taking a blood sample to check your fasting blood sugar level. Testing should be considered at a younger age or be carried out more frequently if you are overweight and have at least 1 risk factor for diabetes. Colorectal cancer can be detected and often prevented. Most routine colorectal cancer screening begins at the age of 50 and continues through age 75. However, your health care provider may recommend screening at an earlier age if you have risk factors for colon cancer. On a yearly basis, your health care provider may provide home test kits to check for hidden blood in the stool. Use of a small camera at the end of a tube to directly examine the colon (sigmoidoscopy or colonoscopy) can detect the earliest forms of colorectal cancer. Talk to your health care provider about this at age 50, when routine screening begins. Direct exam of the colon should be repeated every 5-10 years through age 75, unless early forms of precancerous polyps or small growths are found. Hepatitis C blood testing is recommended for all people born from 1945 through 1965 and any individual with known risks for hepatitis C. Screening for abdominal aortic aneurysm (AAA)  by ultrasound is recommended for people who have history of high blood pressure or who are current or former smokers. Healthy men should  receive prostate-specific antigen (PSA) blood tests as part of routine cancer screening.  Talk with your health care provider about prostate cancer screening. Testicular cancer screening is  recommended for adult males. Screening includes self-exam, a health care provider exam, and other screening tests. Consult with your health care provider about any symptoms you have or any concerns you have about testicular cancer. Use sunscreen. Apply sunscreen liberally and repeatedly throughout the day. You should seek shade when your shadow is shorter than   you. Protect yourself by wearing long sleeves, pants, a wide-brimmed hat, and sunglasses year round, whenever you are outdoors. Once a month, do a whole-body skin exam, using a mirror to look at the skin on your back. Tell your health care provider about new moles, moles that have irregular borders, moles that are larger than a pencil eraser, or moles that have changed in shape or color. Stay current with required vaccines (immunizations). Influenza vaccine. All adults should be immunized every year. Tetanus, diphtheria, and acellular pertussis (Td, Tdap) vaccine. An adult who has not previously received Tdap or who does not know his vaccine status should receive 1 dose of Tdap. This initial dose should be followed by tetanus and diphtheria toxoids (Td) booster doses every 10 years. Adults with an unknown or incomplete history of completing a 3-dose immunization series with Td-containing vaccines should begin or complete a primary immunization series including a Tdap dose. Adults should receive a Td booster every 10 years. Zoster vaccine. One dose is recommended for adults aged 60 years or older unless certain conditions are present.  PREVNAR - Pneumococcal 13-valent conjugate (PCV13) vaccine. When indicated, a person who is uncertain of his immunization history and has no record of immunization should receive the PCV13 vaccine. An adult aged 19 years or older who has certain medical conditions and has not been previously immunized should receive 1  dose of PCV13 vaccine. This PCV13 should be followed with a dose of pneumococcal polysaccharide (PPSV23) vaccine. The PPSV23 vaccine dose should be obtained 1 or more year(s)after the dose of PCV13 vaccine. An adult aged 19 years or older who has certain medical conditions and previously received 1 or more doses of PPSV23 vaccine should receive 1 dose of PCV13. The PCV13 vaccine dose should be obtained 1 or more years after the last PPSV23 vaccine dose.  PNEUMOVAX - Pneumococcal polysaccharide (PPSV23) vaccine. When PCV13 is also indicated, PCV13 should be obtained first. All adults aged 65 years and older should be immunized. An adult younger than age 65 years who has certain medical conditions should be immunized. Any person who resides in a nursing home or long-term care facility should be immunized. An adult smoker should be immunized. People with an immunocompromised condition and certain other conditions should receive both PCV13 and PPSV23 vaccines. People with human immunodeficiency virus (HIV) infection should be immunized as soon as possible after diagnosis. Immunization during chemotherapy or radiation therapy should be avoided. Routine use of PPSV23 vaccine is not recommended for American Indians, [place] Natives, or people younger than 65 years unless there are medical conditions that require PPSV23 vaccine. When indicated, people who have unknown immunization and have no record of immunization should receive PPSV23 vaccine. One-time revaccination 5 years after the first dose of PPSV23 is recommended for people aged 19-64 years who have chronic kidney failure, nephrotic syndrome, asplenia, or immunocompromised conditions. People who received 1-2 doses of PPSV23 before age 65 years should receive another dose of PPSV23 vaccine at age 65 years or later if at least 5 years have passed since the previous dose. Doses of PPSV23 are not needed for people immunized with PPSV23 at or after age 65  years.  Hepatitis A vaccine. Adults who wish to be protected from this disease, have certain high-risk conditions, work with hepatitis A-infected animals, work in hepatitis A research labs, or travel to or work in countries with a high rate of hepatitis A should be immunized. Adults who were previously unvaccinated and who anticipate close contact   with an international adoptee during the first 60 days after arrival in the [place] from a country with a high rate of hepatitis A should be immunized.  Hepatitis B vaccine. Adults should be immunized if they wish to be protected from this disease, have certain high-risk conditions, may be exposed to blood or other infectious body fluids, are household contacts or sex partners of hepatitis B positive people, are clients or workers in certain care facilities, or travel to or work in countries with a high rate of hepatitis B.  Preventive Service / Frequency  Ages 65 and over Blood pressure check. Lipid and cholesterol check. Lung cancer screening. / Every year if you are aged 55-80 years and have a 30-pack-year history of smoking and currently smoke or have quit within the past 15 years. Yearly screening is stopped once you have quit smoking for at least 15 years or develop a health problem that would prevent you from having lung cancer treatment. Fecal occult blood test (FOBT) of stool. You may not have to do this test if you get a colonoscopy every 10 years. Flexible sigmoidoscopy** or colonoscopy.** / Every 5 years for a flexible sigmoidoscopy or every 10 years for a colonoscopy beginning at age 50 and continuing until age 75. Hepatitis C blood test.** / For all people born from 1945 through 1965 and any individual with known risks for hepatitis C. Abdominal aortic aneurysm (AAA) screening./ Screening current or former smokers or have Hypertension. Skin self-exam. / Monthly. Influenza vaccine. / Every year. Tetanus, diphtheria, and acellular  pertussis (Tdap/Td) vaccine.** / 1 dose of Td every 10 years.  Zoster vaccine.** / 1 dose for adults aged 60 years or older.         Pneumococcal 13-valent conjugate (PCV13) vaccine.   Pneumococcal polysaccharide (PPSV23) vaccine.   Hepatitis A vaccine.** / Consult your health care provider. Hepatitis B vaccine.** / Consult your health care provider. Screening for abdominal aortic aneurysm (AAA)  by ultrasound is recommended for people who have history of high blood pressure or who are current or former smokers. ++++++++++ Recommend Adult Low Dose Aspirin or  coated  Aspirin 81 mg daily  To reduce risk of Colon Cancer 40 %,  Skin Cancer 26 % ,  Malignant Melanoma 46%  and  Pancreatic cancer 60% ++++++++++++++++++++++ Vitamin D goal  is between 70-100.  Please make sure that you are taking your Vitamin D as directed.  It is very important as a natural anti-inflammatory  helping hair, skin, and nails, as well as reducing stroke and heart attack risk.  It helps your bones and helps with mood. It also decreases numerous cancer risks so please take it as directed.  Low Vit D is associated with a 200-300% higher risk for CANCER  and 200-300% higher risk for HEART   ATTACK  &  STROKE.   ...................................... It is also associated with higher death rate at younger ages,  autoimmune diseases like Rheumatoid arthritis, Lupus, Multiple Sclerosis.    Also many other serious conditions, like depression, Alzheimer's Dementia, infertility, muscle aches, fatigue, fibromyalgia - just to name a few. ++++++++++++++++++++++ Recommend the book "The END of DIETING" by Dr Joel Fuhrman  & the book "The END of DIABETES " by Dr Joel Fuhrman At [employer].com - get book & Audio CD's    Being diabetic has a  300% increased risk for heart attack, stroke, cancer, and alzheimer- type vascular dementia. It is very important that you work harder with diet by   avoiding all foods that are white. Avoid  white rice (Mestas & wild rice is OK), white potatoes (sweetpotatoes in moderation is OK), White bread or wheat bread or anything made out of white flour like bagels, donuts, rolls, buns, biscuits, cakes, pastries, cookies, pizza crust, and pasta (made from white flour & egg whites) - vegetarian pasta or spinach or wheat pasta is OK. Multigrain breads like Arnold's or Pepperidge Farm, or multigrain sandwich thins or flatbreads.  Diet, exercise and weight loss can reverse and cure diabetes in the early stages.  Diet, exercise and weight loss is very important in the control and prevention of complications of diabetes which affects every system in your body, ie. Brain - dementia/stroke, eyes - glaucoma/blindness, heart - heart attack/heart failure, kidneys - dialysis, stomach - gastric paralysis, intestines - malabsorption, nerves - severe painful neuritis, circulation - gangrene & loss of a leg(s), and finally cancer and Alzheimers.    I recommend avoid fried & greasy foods,  sweets/candy, white rice (Labus or wild rice or Quinoa is OK), white potatoes (sweet potatoes are OK) - anything made from white flour - bagels, doughnuts, rolls, buns, biscuits,white and wheat breads, pizza crust and traditional pasta made of white flour & egg white(vegetarian pasta or spinach or wheat pasta is OK).  Multi-grain bread is OK - like multi-grain flat bread or sandwich thins. Avoid alcohol in excess. Exercise is also important.    Eat all the vegetables you want - avoid meat, especially red meat and dairy - especially cheese.  Cheese is the most concentrated form of trans-fats which is the worst thing to clog up our arteries. Veggie cheese is OK which can be found in the fresh produce section at [employer] or [employer] or [employer]  ++++++++++++++++++++++ DASH Eating Plan  DASH stands for "Dietary Approaches to Stop Hypertension."   The DASH eating plan is a healthy eating plan that has been shown to reduce high  blood pressure (hypertension). Additional health benefits may include reducing the risk of type 2 diabetes mellitus, heart disease, and stroke. The DASH eating plan may also help with weight loss. WHAT DO I NEED TO KNOW ABOUT THE DASH EATING PLAN? For the DASH eating plan, you will follow these general guidelines: Choose foods with a percent daily value for sodium of less than 5% (as listed on the food label). Use salt-free seasonings or herbs instead of table salt or sea salt. Check with your health care provider or pharmacist before using salt substitutes. Eat lower-sodium products, often labeled as "lower sodium" or "no salt added." Eat fresh foods. Eat more vegetables, fruits, and low-fat dairy products. Choose whole grains. Look for the word "whole" as the first word in the ingredient list. Choose fish  Limit sweets, desserts, sugars, and sugary drinks. Choose heart-healthy fats. Eat veggie cheese  Eat more home-cooked food and less restaurant, buffet, and fast food. Limit fried foods. Cook foods using methods other than frying. Limit canned vegetables. If you do use them, rinse them well to decrease the sodium. When eating at a restaurant, ask that your food be prepared with less salt, or no salt if possible.                      WHAT FOODS CAN I EAT? Read Dr Joel Fuhrman's books on The End of Dieting & The End of Diabetes  Grains Whole grain or whole wheat bread. Parmenter rice. Whole grain or whole wheat pasta. Quinoa, bulgur, and   whole grain cereals. Low-sodium cereals. Corn or whole wheat flour tortillas. Whole grain cornbread. Whole grain crackers. Low-sodium crackers.  Vegetables Fresh or frozen vegetables (raw, steamed, roasted, or grilled). Low-sodium or reduced-sodium tomato and vegetable juices. Low-sodium or reduced-sodium tomato sauce and paste. Low-sodium or reduced-sodium canned vegetables.   Fruits All fresh, canned (in natural juice), or frozen fruits.  Protein  Products  All fish and seafood.  Dried beans, peas, or lentils. Unsalted nuts and seeds. Unsalted canned beans.  Dairy Low-fat dairy products, such as skim or 1% milk, 2% or reduced-fat cheeses, low-fat ricotta or cottage cheese, or plain low-fat yogurt. Low-sodium or reduced-sodium cheeses.  Fats and Oils Tub margarines without trans fats. Light or reduced-fat mayonnaise and salad dressings (reduced sodium). Avocado. Safflower, olive, or canola oils. Natural peanut or almond butter.  Other Unsalted popcorn and pretzels. The items listed above may not be a complete list of recommended foods or beverages. Contact your dietitian for more options.  ++++++++++++++++++++  WHAT FOODS ARE NOT RECOMMENDED? Grains/ White flour or wheat flour White bread. White pasta. White rice. Refined cornbread. Bagels and croissants. Crackers that contain trans fat.  Vegetables  Creamed or fried vegetables. Vegetables in a . Regular canned vegetables. Regular canned tomato sauce and paste. Regular tomato and vegetable juices.  Fruits Dried fruits. Canned fruit in light or heavy syrup. Fruit juice.  Meat and Other Protein Products Meat in general - RED meat & White meat.  Fatty cuts of meat. Ribs, chicken wings, all processed meats as bacon, sausage, bologna, salami, fatback, hot dogs, bratwurst and packaged luncheon meats.  Dairy Whole or 2% milk, cream, half-and-half, and cream cheese. Whole-fat or sweetened yogurt. Full-fat cheeses or blue cheese. Non-dairy creamers and whipped toppings. Processed cheese, cheese spreads, or cheese curds.  Condiments Onion and garlic salt, seasoned salt, table salt, and sea salt. Canned and packaged gravies. Worcestershire sauce. Tartar sauce. Barbecue sauce. Teriyaki sauce. Soy sauce, including reduced sodium. Steak sauce. Fish sauce. Oyster sauce. Cocktail sauce. Horseradish. Ketchup and mustard. Meat flavorings and tenderizers. Bouillon cubes. Hot sauce. Tabasco sauce.  Marinades. Taco seasonings. Relishes.  Fats and Oils Butter, stick margarine, lard, shortening and bacon fat. Coconut, palm kernel, or palm oils. Regular salad dressings.  Pickles and olives. Salted popcorn and pretzels.  The items listed above may not be a complete list of foods and beverages to avoid.   

## 2021-09-17 NOTE — Progress Notes (Signed)
Annual  Screening/Preventative Visit  & Comprehensive Evaluation & Examination  Future Appointments  Date Time Provider [place]  09/18/2021      -   CPE   2:00 PM Unk Pinto, MD [facility] None  10/11/2021 12:30 PM Marin Olp, Rudell Cobb, MD [facility] None  10/23/2021 10:30 AM Marin Olp, Rudell Cobb, MD [facility] None  12/06/2021      -    Wellness 11:00 AM Liane Comber, NP [facility] None            This very nice 60 y.o. MWM presents for a Screening /Preventative Visit & comprehensive evaluation and management of multiple medical co-morbidities.  Patient has been followed for HTN, HLD, Prediabetes and Vitamin D Deficiency. Patient follows with Dr Marin Olp for [place] (May 2019) .  Patient relates that he has been on SS Disability since 2020. Patient is on Alendronate for a thoracic Compression fx due to Osteoporosis.  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 Patient has GERD controlled on his meds. Patient also has hx/o a major Depressive Disorder in remission & controlled.        Labile HTN predates since  2013. Patient's BP has been controlled at home.  Today's BP is at goal -  138/70. 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  Patient denies any cardiac symptoms as chest pain, palpitations, shortness of breath, dizziness or ankle swelling.       Patient's hyperlipidemia is controlled with diet and Rosuvastatin/Ezetimibe. Patient denies myalgias or other medication SE's. Last lipids were not at goal:  Lab Results  Component Value Date   CHOL 182 03/14/2021   HDL 50 03/14/2021   LDLCALC 118 (H) 03/14/2021   TRIG 58 03/14/2021   CHOLHDL 3.6 03/14/2021         Patient is monitored expectantly for glucose  intolerance  and patient denies reactive hypoglycemic symptoms,  visual blurring, diabetic polys or paresthesias. Last A1c was normal & at goal:   Lab Results  Component Value Date   HGBA1C 5.2 03/14/2021          Finally, patient has history of Vitamin D Deficiency ("32" /2017) and last vitamin D was near goal (70-100):   Lab Results  Component Value Date   VD25OH 56 03/14/2021     Current Outpatient Medications on File Prior to Visit  Medication Sig   alendronate 70 MG tablet Take 1 tablet  every 7 days.    aspirin EC 81 MG tablet Take daily   VITAMIN D 5000 UNITS  Take 5,000 Units 2 times daily.    escitalopram 20 MG tablet TAKE 1 TABLET DAILY    ezetimibe 10 MG tablet TAKE 1 TABLET EVERY DAY   famciclovir 500 MG tablet TAKE 1 TABLET EVERY DAY   famotidine 40 MG tablet Take 1 tabet Daily    folic acid 1 MG tablet TAKE 1 TABLET DAILY    ibrutinib (IMBRUVICA) 280 MG TABS TAKE 1 TABLET (280 MG) DAILY.   ipratropium  0.06 % nasal spray Use 1 to 2 sprays each nostril 2 to 3 x /day as needed   levocetirizine  5 MG tablet TAKE 1 TABLET  DAILY    Olmesartan 40 MG tablet Take at bedtime. For BP   rosuvastatin  40 MG  tablet TAKE 1 TABLET EVERY DAY    vitamin B-12  500 MCG tablet Take daily.    Allergies  Allergen Reactions   Adalimumab Other (See Comments)    "blacked out", confused     Past Medical History:  Diagnosis Date   Allergy    seasonal allergies   Anemia    Anxiety    on meds   Arthritis    Chest pain, non-cardiac    COPD (Normal)    past smoker   Counseling regarding goals of care 03/27/2018   Depression    on meds   Diverticulitis    Diverticulosis    Fractured pelvis ([place])    fall from ladder   GERD     on meds   Gout    History of shingles    Hyperlipidemia    on meds   Hypertension    on meds   Internal hemorrhoids    Iron deficiency anemia 03/13/2018   Kidney stones    Osteoporosis    Other abnormal glucose 10/11/2015   Rheumatoid arthritis ([place])    hands   Waldenstrom's macroglobulinemia ([place])  03/27/2018    Health Maintenance  Topic Date Due   Zoster Vaccines- Shingrix (1 of 2) Never done   COVID-19 Vaccine (4 - Booster for Pfizer series) 09/28/2020   TETANUS/TDAP  06/03/2021   INFLUENZA VACCINE  06/26/2021   Pneumococcal Vaccine 46-68 Years old (2 - PCV) 12/06/2021   COLONOSCOPY  02/25/2028   Hepatitis C Screening  Completed   HIV Screening  Completed   HPV VACCINES  Aged Out    Immunization History  Administered Date(s) Administered   DTaP 06/04/2011   Hepatitis B 11/26/2010   Hepatitis B, ped/adol 08/06/2011   Influenza Inj Mdck Quad  09/13/2017, 08/21/2018, 09/01/2020   Influenza Split 09/02/2014   Influenza Whole 09/17/2013   Influenza, Seasonal 10/11/2015   Influenza 09/13/2017   PFIZER SARS-COV-2 Vacc  01/24/2020, 03/06/2020, 08/03/2020   PPD Test 10/11/2015, 08/05/2019, 09/01/2020   Pneumococcal -23 12/06/2020    Last Colon - 02/24/2021 - Dr Hilarie Fredrickson - Polyp - Recc 7 year F/U  - due Apr 2029    Past Surgical History:  Procedure Laterality Date   COLONOSCOPY  2016   JMP-suprep(exc)-mild TICS/int hems/normal-5 yr recall   FINGER SURGERY     iron infusion     KNEE SURGERY     right   PACEMAKER IMPLANT N/A 08/10/2020   Procedure: PACEMAKER IMPLANT;  Surgeon: Constance Haw, MD;  Location: [facility];  Service: Cardiovascular;  Laterality: N/A;   ROTATOR CUFF REPAIR  1996   left   WISDOM TOOTH EXTRACTION       Family History  Problem Relation Age of Onset   Hyperlipidemia Mother    COPD Mother    Heart failure Mother    Colon polyps Mother    Breast cancer Mother    Rectal cancer Mother    Hyperlipidemia Brother    Colon polyps Brother    Stroke Maternal Grandfather    COPD Maternal Grandfather        colon   Colon cancer Maternal Grandfather 13   Colon polyps Maternal Grandfather 63   Hypertension Father    Colon polyps Daughter 70       lynch syndrome   Esophageal cancer Neg Hx    Stomach cancer Neg Hx      Social  History   Tobacco Use   Smoking status: Former  Packs/day: 2.00    Types: Cigarettes    Quit date: 07/03/2010    Years since quitting: 11.2   Smokeless tobacco: Never  Vaping Use   Vaping Use: Never used  Substance Use Topics   Alcohol use: No    Alcohol/week: 0.0 standard drinks   Drug use: No      ROS Constitutional: Denies fever, chills, weight loss/gain, headaches, insomnia,  night sweats or change in appetite. Does c/o fatigue. Eyes: Denies redness, blurred vision, diplopia, discharge, itchy or watery eyes.  ENT: Denies discharge, congestion, post nasal drip, epistaxis, sore throat, earache, hearing loss, dental pain, Tinnitus, Vertigo, Sinus pain or snoring.  Cardio: Denies chest pain, palpitations, irregular heartbeat, syncope, dyspnea, diaphoresis, orthopnea, PND, claudication or edema Respiratory: denies cough, dyspnea, DOE, pleurisy, hoarseness, laryngitis or wheezing.  Gastrointestinal: Denies dysphagia, heartburn, reflux, water brash, pain, cramps, nausea, vomiting, bloating, diarrhea, constipation, hematemesis, melena, hematochezia, jaundice or hemorrhoids Genitourinary: Denies dysuria, frequency, urgency, nocturia, hesitancy, discharge, hematuria or flank pain Musculoskeletal: Denies arthralgia, myalgia, stiffness, Jt. Swelling, pain, limp or strain/sprain. Denies Falls. Skin: Denies puritis, rash, hives, warts, acne, eczema or change in skin lesion Neuro: No weakness, tremor, incoordination, spasms, paresthesia or pain Psychiatric: Denies confusion, memory loss or sensory loss. Denies Depression. Endocrine: Denies change in weight, skin, hair change, nocturia, and paresthesia, diabetic polys, visual blurring or hyper / hypo glycemic episodes.  Heme/Lymph: No excessive bleeding, bruising or enlarged lymph nodes.   Physical Exam  BP 138/70   Pulse 63   Temp (!) 97.3 F (36.3 C)   Resp 16   Ht 5\' 9"  (1.753 m)   Wt 168 lb 12.8 oz (76.6 kg)   SpO2 97%   BMI 24.93  kg/m   General Appearance: Well nourished and well groomed and in no apparent distress.  Eyes: PERRLA, EOMs, conjunctiva no swelling or erythema, normal fundi and vessels. Sinuses: No frontal/maxillary tenderness ENT/Mouth: EACs patent / TMs  nl. Nares clear without erythema, swelling, mucoid exudates. Oral hygiene is good. No erythema, swelling, or exudate. Tongue normal, non-obstructing. Tonsils not swollen or erythematous. Hearing normal.  Neck: Supple, thyroid not palpable. No bruits, nodes or JVD. Respiratory: Respiratory effort normal.  BS equal and clear bilateral without rales, rhonci, wheezing or stridor. Cardio: Heart sounds are normal with regular rate and rhythm and no murmurs, rubs or gallops. Peripheral pulses are normal and equal bilaterally without edema. No aortic or femoral bruits. Chest: symmetric with normal excursions and percussion.  Abdomen: Soft, with Nl bowel sounds. Nontender, no guarding, rebound, hernias, masses, or organomegaly.  Lymphatics: Non tender without lymphadenopathy.  Musculoskeletal: Full ROM all peripheral extremities, joint stability, 5/5 strength, and normal gait. Skin: Warm and dry without rashes, lesions, cyanosis, clubbing or  ecchymosis.  Neuro: Cranial nerves intact, reflexes equal bilaterally. Normal muscle tone, no cerebellar symptoms. Sensation intact.  Pysch: Alert and oriented X 3 with normal affect, insight and judgment appropriate.   Assessment and Plan   1. Labile hypertension  - EKG 12-Lead - [place], RETROPERITNL ABD,  LTD - Urinalysis, Routine w reflex microscopic - Microalbumin / creatinine urine ratio - Magnesium - TSH  2. Hyperlipidemia, mixed  - EKG 12-Lead - [place], RETROPERITNL ABD,  LTD - Lipid panel - TSH  3. Abnormal glucose  - EKG 12-Lead - [place], RETROPERITNL ABD,  LTD - Hemoglobin A1c - Insulin, random  4. Vitamin D deficiency  - VITAMIN D 25 Hydroxy (Vit-D Deficiency, Fractures)  5. Presence of permanent  cardiac pacemaker  -  EKG 12-Lead  6. 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 BPH with obstruction/lower urinary tract symptoms  - PSA  8. Osteoporosis    9. Screening for colon cancer  - POC Hemoccult Bld/Stl   10. Prostate cancer screening  - PSA  11. Screening for heart disease  - EKG 12-Lead  12. FHx: heart disease  - [place], RETROPERITNL ABD,  LTD  13. Former smoker  - EKG 12-Lead - [place], RETROPERITNL ABD,  LTD  14. Screening for AAA (aortic abdominal aneurysm)  - [place], RETROPERITNL ABD,  LTD  15. Medication management  - Urinalysis, Routine w reflex microscopic - Microalbumin / creatinine urine ratio - Magnesium - Lipid panel - TSH - Hemoglobin A1c - Insulin, random - VITAMIN D 25 Hydroxy             Patient was counseled in prudent diet, weight control to achieve/maintain BMI less than 25, BP monitoring, regular exercise and medications as discussed.  Discussed med effects and SE's. Routine screening labs and tests as requested with regular follow-up as recommended. Over 40 minutes of exam, counseling, chart review and high complex critical decision making was performed   Kirtland Bouchard, MD

## 2021-09-18 ENCOUNTER — Encounter: Payer: Self-pay | Admitting: Internal Medicine

## 2021-09-18 ENCOUNTER — Other Ambulatory Visit: Payer: Self-pay

## 2021-09-18 ENCOUNTER — Ambulatory Visit (INDEPENDENT_AMBULATORY_CARE_PROVIDER_SITE_OTHER): Payer: Medicare Other | Admitting: Internal Medicine

## 2021-09-18 VITALS — BP 138/70 | HR 63 | Temp 97.3°F | Resp 16 | Ht 69.0 in | Wt 168.8 lb

## 2021-09-18 DIAGNOSIS — Z8249 Family history of ischemic heart disease and other diseases of the circulatory system: Secondary | ICD-10-CM | POA: Diagnosis not present

## 2021-09-18 DIAGNOSIS — Z136 Encounter for screening for cardiovascular disorders: Secondary | ICD-10-CM | POA: Diagnosis not present

## 2021-09-18 DIAGNOSIS — R7309 Other abnormal glucose: Secondary | ICD-10-CM | POA: Diagnosis not present

## 2021-09-18 DIAGNOSIS — Z1211 Encounter for screening for malignant neoplasm of colon: Secondary | ICD-10-CM

## 2021-09-18 DIAGNOSIS — E559 Vitamin D deficiency, unspecified: Secondary | ICD-10-CM | POA: Diagnosis not present

## 2021-09-18 DIAGNOSIS — Z950 Presence of cardiac pacemaker: Secondary | ICD-10-CM

## 2021-09-18 DIAGNOSIS — E782 Mixed hyperlipidemia: Secondary | ICD-10-CM | POA: Diagnosis not present

## 2021-09-18 DIAGNOSIS — M810 Age-related osteoporosis without current pathological fracture: Secondary | ICD-10-CM

## 2021-09-18 DIAGNOSIS — N138 Other obstructive and reflux uropathy: Secondary | ICD-10-CM

## 2021-09-18 DIAGNOSIS — C880 Waldenstrom macroglobulinemia: Secondary | ICD-10-CM

## 2021-09-18 DIAGNOSIS — R0989 Other specified symptoms and signs involving the circulatory and respiratory systems: Secondary | ICD-10-CM | POA: Diagnosis not present

## 2021-09-18 DIAGNOSIS — N401 Enlarged prostate with lower urinary tract symptoms: Secondary | ICD-10-CM

## 2021-09-18 DIAGNOSIS — Z79899 Other long term (current) drug therapy: Secondary | ICD-10-CM

## 2021-09-18 DIAGNOSIS — Z125 Encounter for screening for malignant neoplasm of prostate: Secondary | ICD-10-CM

## 2021-09-18 DIAGNOSIS — Z87891 Personal history of nicotine dependence: Secondary | ICD-10-CM

## 2021-09-18 MED ORDER — BUSPIRONE HCL 7.5 MG PO TABS: ORAL_TABLET | ORAL | 1 refills | Status: DC

## 2021-09-19 LAB — URINALYSIS, ROUTINE W REFLEX MICROSCOPIC
Bilirubin Urine: NEGATIVE
Glucose, UA: NEGATIVE
Hgb urine dipstick: NEGATIVE
Ketones, ur: NEGATIVE
Leukocytes,Ua: NEGATIVE
Nitrite: NEGATIVE
Protein, ur: NEGATIVE
Specific Gravity, Urine: 1.012 (ref 1.001–1.035)
pH: 5.5 (ref 5.0–8.0)

## 2021-09-19 LAB — LIPID PANEL
Cholesterol: 114 mg/dL (ref <200)
HDL: 41 mg/dL (ref >=40)
LDL Cholesterol (Calc): 60 mg/dL (calc)
Non-HDL Cholesterol (Calc): 73 mg/dL (calc) (ref <130)
Total CHOL/HDL Ratio: 2.8 (calc) (ref <5.0)
Triglycerides: 57 mg/dL (ref <150)

## 2021-09-19 LAB — MICROALBUMIN / CREATININE URINE RATIO
Creatinine, Urine: 91 mg/dL (ref 20–320)
Microalb Creat Ratio: 3 mcg/mg creat (ref <30)
Microalb, Ur: 0.3 mg/dL

## 2021-09-19 LAB — HEMOGLOBIN A1C
Hgb A1c MFr Bld: 5.3 % of total Hgb (ref <5.7)
Mean Plasma Glucose: 105 mg/dL
eAG (mmol/L): 5.8 mmol/L

## 2021-09-19 LAB — VITAMIN D 25 HYDROXY (VIT D DEFICIENCY, FRACTURES): Vit D, 25-Hydroxy: 65 ng/mL (ref 30–100)

## 2021-09-19 LAB — PSA: PSA: 1.21 ng/mL (ref <=4.00)

## 2021-09-19 LAB — MAGNESIUM: Magnesium: 2.1 mg/dL (ref 1.5–2.5)

## 2021-09-19 LAB — INSULIN, RANDOM: Insulin: 4.8 u[IU]/mL

## 2021-09-19 LAB — TSH: TSH: 2.8 mIU/L (ref 0.40–4.50)

## 2021-09-19 NOTE — Progress Notes (Signed)
============================================================ -   Test results slightly outside the reference range are not unusual. If there is anything important, I will review this with you,  otherwise it is considered normal test values.  If you have further questions,  please do not hesitate to contact me at the office or via My Chart.  ============================================================ ============================================================  -  PSA - Low  - Great ! ============================================================ ============================================================  - -  Total  Chol =   114        and        LDL  Chol =  60   - Both  Excellent   - Very low risk for Heart Attack  / Stroke ============================================================ ============================================================  -  A1c - Normal - No Diabetes  - Excellent   ! ============================================================ ============================================================  - Vitamin DE = 65 - Excellent ============================================================ ============================================================  - All Else - CBC - Kidneys - Electrolytes - Liver - Magnesium & Thyroid    - all  Normal / OK ============================================================ ============================================================  - Keep up the [place] Work  !  ============================================================ ============================================================

## 2021-10-09 ENCOUNTER — Other Ambulatory Visit (HOSPITAL_COMMUNITY): Payer: Self-pay

## 2021-10-11 ENCOUNTER — Other Ambulatory Visit (HOSPITAL_COMMUNITY): Payer: Self-pay

## 2021-10-11 ENCOUNTER — Inpatient Hospital Stay (HOSPITAL_BASED_OUTPATIENT_CLINIC_OR_DEPARTMENT_OTHER): Payer: Medicare Other | Admitting: Family

## 2021-10-11 ENCOUNTER — Other Ambulatory Visit: Payer: Self-pay

## 2021-10-11 ENCOUNTER — Encounter: Payer: Self-pay | Admitting: Family

## 2021-10-11 ENCOUNTER — Inpatient Hospital Stay: Payer: Medicare Other | Attending: Hematology & Oncology

## 2021-10-11 VITALS — BP 140/77 | HR 63 | Temp 97.8°F | Resp 18 | Ht 69.0 in | Wt 169.4 lb

## 2021-10-11 DIAGNOSIS — C880 Waldenstrom macroglobulinemia: Secondary | ICD-10-CM | POA: Diagnosis present

## 2021-10-11 DIAGNOSIS — D509 Iron deficiency anemia, unspecified: Secondary | ICD-10-CM | POA: Diagnosis not present

## 2021-10-11 DIAGNOSIS — D500 Iron deficiency anemia secondary to blood loss (chronic): Secondary | ICD-10-CM

## 2021-10-11 LAB — CBC WITH DIFFERENTIAL (CANCER CENTER ONLY)
Abs Immature Granulocytes: 0.02 10*3/uL (ref 0.00–0.07)
Basophils Absolute: 0 10*3/uL (ref 0.0–0.1)
Basophils Relative: 1 %
Eosinophils Absolute: 0.1 10*3/uL (ref 0.0–0.5)
Eosinophils Relative: 2 %
HCT: 39.2 % (ref 39.0–52.0)
Hemoglobin: 13.2 g/dL (ref 13.0–17.0)
Immature Granulocytes: 0 %
Lymphocytes Relative: 23 %
Lymphs Abs: 1.2 10*3/uL (ref 0.7–4.0)
MCH: 32.9 pg (ref 26.0–34.0)
MCHC: 33.7 g/dL (ref 30.0–36.0)
MCV: 97.8 fL (ref 80.0–100.0)
Monocytes Absolute: 0.6 10*3/uL (ref 0.1–1.0)
Monocytes Relative: 13 %
Neutro Abs: 3 10*3/uL (ref 1.7–7.7)
Neutrophils Relative %: 61 %
Platelet Count: 220 10*3/uL (ref 150–400)
RBC: 4.01 MIL/uL — ABNORMAL LOW (ref 4.22–5.81)
RDW: 13.2 % (ref 11.5–15.5)
WBC Count: 4.9 10*3/uL (ref 4.0–10.5)
nRBC: 0 % (ref 0.0–0.2)

## 2021-10-11 LAB — CMP (CANCER CENTER ONLY)
ALT: 15 U/L (ref 0–44)
AST: 20 U/L (ref 15–41)
Albumin: 4.4 g/dL (ref 3.5–5.0)
Alkaline Phosphatase: 66 U/L (ref 38–126)
Anion gap: 8 (ref 5–15)
BUN: 15 mg/dL (ref 8–23)
CO2: 30 mmol/L (ref 22–32)
Calcium: 9.2 mg/dL (ref 8.9–10.3)
Chloride: 103 mmol/L (ref 98–111)
Creatinine: 1.2 mg/dL (ref 0.61–1.24)
GFR, Estimated: >60 mL/min (ref >60)
Glucose, Bld: 106 mg/dL — ABNORMAL HIGH (ref 70–99)
Potassium: 3.8 mmol/L (ref 3.5–5.1)
Sodium: 141 mmol/L (ref 135–145)
Total Bilirubin: 0.7 mg/dL (ref 0.3–1.2)
Total Protein: 6.5 g/dL (ref 6.5–8.1)

## 2021-10-11 LAB — LACTATE DEHYDROGENASE: LDH: 170 U/L (ref 98–192)

## 2021-10-11 NOTE — Progress Notes (Signed)
Hematology and Oncology Follow Up Visit  Edwin Webster [id number] 12-15-1960 60 y.o. 10/11/2021   Principle Diagnosis:  Waldenstrom's macroglobulinemia Iron deficiency anemia  Past Therapy: Rituxan/bendamustine-s/p cycle 2 -- d/c on 06/11/2018   Current Therapy:        Imbruvica  280 mg po q day -- changed on 08/06/2019 IV iron as indicated    Interim History:  Edwin Webster is here today for follow-up. He is doing well and has noticed that his back pain is improved. He is on Fosomax and is taking vitamin D daily.  He does note chills and feeling cold all the time.  No blood loss noted. No bruising or petechiae.  No fever, n/v, cough, rash, dizziness, SOB, chest pain, palpitations, abdominal pain or changes in bowel or bladder habits.  IgM level was 388 in August.  No swelling, numbness or tingling in his extremities at this time.  No falls or syncope reported.  He has a good appetite and is staying well hydrated. His weight is stable at 169 lbs.   ECOG Performance Status: 1 - Symptomatic but completely ambulatory  Medications:  Allergies as of 10/11/2021       Reactions   Adalimumab Other (See Comments)   "blacked out", confused        Medication List        Accurate as of October 11, 2021 12:04 PM. If you have any questions, ask your nurse or doctor.          alendronate 70 MG tablet Commonly known as: Fosamax Take 1 tablet (70 mg total) by mouth every 7 (seven) days. Take with a full glass of water on an empty stomach and avoid reclining or lying down for 2 hours after taking this medication.   aspirin EC 81 MG tablet Take 81 mg daily by mouth.   busPIRone 7.5 MG tablet Commonly known as: BUSPAR Take  1 tablet  3 x /day  for Chronic Anxiety   escitalopram 20 MG tablet Commonly known as: LEXAPRO TAKE 1 TABLET BY MOUTH DAILY FOR MOOD   ezetimibe 10 MG tablet Commonly known as: ZETIA TAKE 1 TABLET BY MOUTH EVERY DAY   famciclovir 500 MG  tablet Commonly known as: FAMVIR TAKE 1 TABLET BY MOUTH EVERY DAY   famotidine 40 MG tablet Commonly known as: PEPCID Take 1 tabet Daily for Heartburn and Indigestion   folic acid 1 MG tablet Commonly known as: FOLVITE TAKE 1 TABLET DAILY FOR FOLATE DEFICIENCY   Imbruvica 280 MG Tabs Generic drug: ibrutinib TAKE 1 TABLET (280 MG) BY MOUTH DAILY.   ipratropium 0.06 % nasal spray Commonly known as: ATROVENT Use 1 to 2 sprays each nostril 2 to 3 x /day as needed   levocetirizine 5 MG tablet Commonly known as: XYZAL TAKE 1 TABLET BY MOUTH DAILY FOR ALLERGIES   olmesartan 40 MG tablet Commonly known as: BENICAR Take 40 mg by mouth at bedtime. For BP   rosuvastatin 40 MG tablet Commonly known as: CRESTOR TAKE 1 TABLET BY MOUTH EVERY DAY FOR CHOLESTEROL   vitamin B-12 500 MCG tablet Commonly known as: CYANOCOBALAMIN Take 500 mcg by mouth daily.   Vitamin D-3 125 MCG (5000 UT) Tabs Take 5,000 Units by mouth 2 (two) times daily.        Allergies:  Allergies  Allergen Reactions   Adalimumab Other (See Comments)    "blacked out", confused     Past Medical History, Surgical history, Social history, and Family History were  reviewed and updated.  Review of Systems: All other 10 point review of systems is negative.   Physical Exam:  vitals were not taken for this visit.   Wt Readings from Last 3 Encounters:  09/18/21 168 lb 12.8 oz (76.6 kg)  08/31/21 168 lb (76.2 kg)  08/18/21 166 lb (75.3 kg)    Ocular: Sclerae unicteric, pupils equal, round and reactive to light Ear-nose-throat: Oropharynx clear, dentition fair Lymphatic: No cervical or supraclavicular adenopathy Lungs no rales or rhonchi, good excursion bilaterally Heart regular rate and rhythm, no murmur appreciated Abd soft, nontender, positive bowel sounds MSK no focal spinal tenderness, no joint edema Neuro: non-focal, well-oriented, appropriate affect Breasts: Deferred   Lab Results  Component  Value Date   WBC 4.9 10/11/2021   HGB 13.2 10/11/2021   HCT 39.2 10/11/2021   MCV 97.8 10/11/2021   PLT 220 10/11/2021   Lab Results  Component Value Date   FERRITIN 178 07/21/2021   IRON 87 07/21/2021   TIBC 301 07/21/2021   UIBC 214 07/21/2021   IRONPCTSAT 29 07/21/2021   Lab Results  Component Value Date   RETICCTPCT 0.9 02/12/2018   RBC 4.01 (L) 10/11/2021   RETICCTABS 33,120 02/12/2018   Lab Results  Component Value Date   KPAFRELGTCHN 24.3 (H) 07/21/2021   LAMBDASER 11.4 07/21/2021   KAPLAMBRATIO 2.13 (H) 07/21/2021   Lab Results  Component Value Date   IGGSERUM 408 (L) 07/21/2021   IGGSERUM 403 (L) 07/21/2021   IGA 34 (L) 07/21/2021   IGA 36 (L) 07/21/2021   IGMSERUM 388 (H) 07/21/2021   IGMSERUM 412 (H) 07/21/2021   Lab Results  Component Value Date   TOTALPROTELP 6.0 07/21/2021   ALBUMINELP 3.8 07/21/2021   A1GS 0.2 07/21/2021   A2GS 0.6 07/21/2021   BETS 0.7 07/21/2021   BETA2SER 0.2 02/18/2018   GAMS 0.6 07/21/2021   MSPIKE 0.3 (H) 07/21/2021   SPEI Comment 07/09/2018     Chemistry      Component Value Date/Time   NA 140 07/21/2021 0953   NA 141 08/04/2020 1203   K 4.0 07/21/2021 0953   CL 104 07/21/2021 0953   CO2 29 07/21/2021 0953   BUN 18 07/21/2021 0953   BUN 13 08/04/2020 1203   CREATININE 1.26 (H) 07/21/2021 0953   CREATININE 1.18 03/14/2021 1104      Component Value Date/Time   CALCIUM 9.3 07/21/2021 0953   ALKPHOS 66 07/21/2021 0953   AST 23 07/21/2021 0953   ALT 17 07/21/2021 0953   BILITOT 0.8 07/21/2021 0953       Impression and Plan: Edwin Webster is a very pleasant 60 yo caucasian gentleman with Waldenstrom's macroglobulinemia.  Protein studies are pending.  He will continue his same regimen with Imbruvica.  Iron studies are also pending. We will replace if needed.  Follow-up in 3 months.  He can contact our office with any questions or concerns.   Lottie Dawson, NP 11/16/202212:04 PM

## 2021-10-12 LAB — FERRITIN: Ferritin: 122 ng/mL (ref 24–336)

## 2021-10-12 LAB — IRON AND TIBC
Iron: 75 ug/dL (ref 42–163)
Saturation Ratios: 24 % (ref 20–55)
TIBC: 309 ug/dL (ref 202–409)
UIBC: 234 ug/dL (ref 117–376)

## 2021-10-12 LAB — IGG, IGA, IGM
IgA: 37 mg/dL — ABNORMAL LOW (ref 61–437)
IgG (Immunoglobin G), Serum: 500 mg/dL — ABNORMAL LOW (ref 603–1613)
IgM (Immunoglobulin M), Srm: 412 mg/dL — ABNORMAL HIGH (ref 20–172)

## 2021-10-12 LAB — KAPPA/LAMBDA LIGHT CHAINS
Kappa free light chain: 28.9 mg/L — ABNORMAL HIGH (ref 3.3–19.4)
Kappa, lambda light chain ratio: 3.07 — ABNORMAL HIGH (ref 0.26–1.65)
Lambda free light chains: 9.4 mg/L (ref 5.7–26.3)

## 2021-10-13 ENCOUNTER — Other Ambulatory Visit (HOSPITAL_COMMUNITY): Payer: Self-pay

## 2021-10-18 LAB — PROTEIN ELECTROPHORESIS, SERUM, WITH REFLEX
A/G Ratio: 1.6 (ref 0.7–1.7)
Albumin ELP: 3.9 g/dL (ref 2.9–4.4)
Alpha-1-Globulin: 0.2 g/dL (ref 0.0–0.4)
Alpha-2-Globulin: 0.7 g/dL (ref 0.4–1.0)
Beta Globulin: 0.8 g/dL (ref 0.7–1.3)
Gamma Globulin: 0.7 g/dL (ref 0.4–1.8)
Globulin, Total: 2.4 g/dL (ref 2.2–3.9)
M-Spike, %: 0.3 g/dL — ABNORMAL HIGH
SPEP Interpretation: 0
Total Protein ELP: 6.3 g/dL (ref 6.0–8.5)

## 2021-10-18 LAB — IMMUNOFIXATION REFLEX, SERUM
IgA: 36 mg/dL — ABNORMAL LOW (ref 61–437)
IgG (Immunoglobin G), Serum: 516 mg/dL — ABNORMAL LOW (ref 603–1613)
IgM (Immunoglobulin M), Srm: 416 mg/dL — ABNORMAL HIGH (ref 20–172)

## 2021-10-23 ENCOUNTER — Inpatient Hospital Stay: Payer: Medicare Other | Admitting: Hematology & Oncology

## 2021-10-23 ENCOUNTER — Inpatient Hospital Stay: Payer: Medicare Other

## 2021-11-07 ENCOUNTER — Other Ambulatory Visit: Payer: Self-pay | Admitting: Hematology & Oncology

## 2021-11-07 ENCOUNTER — Other Ambulatory Visit (HOSPITAL_COMMUNITY): Payer: Self-pay

## 2021-11-07 DIAGNOSIS — C880 Waldenstrom macroglobulinemia: Secondary | ICD-10-CM

## 2021-11-07 MED ORDER — IMBRUVICA 280 MG PO TABS
ORAL_TABLET | ORAL | 3 refills | Status: DC
  Filled 2021-11-07: qty 28, 28d supply, fill #0
  Filled 2021-12-01: qty 28, 28d supply, fill #1
  Filled 2021-12-22: qty 28, 28d supply, fill #2
  Filled 2022-01-19: qty 28, 28d supply, fill #3

## 2021-11-07 NOTE — Telephone Encounter (Signed)
Received refill request for Imbruvica 280 mg # 28 with 3 refill. Last OV 10/11/21 and next apt scheduled 01/11/22. Last refilled Rx 07/19/21 for a 4 month supply. Please advise if ok to refill, thanks!

## 2021-11-08 ENCOUNTER — Other Ambulatory Visit (HOSPITAL_COMMUNITY): Payer: Self-pay

## 2021-11-09 ENCOUNTER — Ambulatory Visit (INDEPENDENT_AMBULATORY_CARE_PROVIDER_SITE_OTHER): Payer: Medicare Other

## 2021-11-09 DIAGNOSIS — I495 Sick sinus syndrome: Secondary | ICD-10-CM

## 2021-11-09 LAB — CUP PACEART REMOTE DEVICE CHECK
Battery Remaining Longevity: 114 mo
Battery Remaining Percentage: 92 %
Battery Voltage: 3.01 V
Brady Statistic AP VP Percent: 1 %
Brady Statistic AP VS Percent: 78 %
Brady Statistic AS VP Percent: 1 %
Brady Statistic AS VS Percent: 22 %
Brady Statistic RA Percent Paced: 78 %
Brady Statistic RV Percent Paced: 1 %
Date Time Interrogation Session: 20221215020012
Implantable Lead Implant Date: 20210915
Implantable Lead Implant Date: 20210915
Implantable Lead Location: 753859
Implantable Lead Location: 753860
Implantable Pulse Generator Implant Date: 20210915
Lead Channel Impedance Value: 450 Ohm
Lead Channel Impedance Value: 550 Ohm
Lead Channel Pacing Threshold Amplitude: 0.625 V
Lead Channel Pacing Threshold Amplitude: 0.625 V
Lead Channel Pacing Threshold Pulse Width: 0.5 ms
Lead Channel Pacing Threshold Pulse Width: 0.5 ms
Lead Channel Sensing Intrinsic Amplitude: 12 mV
Lead Channel Sensing Intrinsic Amplitude: 3.8 mV
Lead Channel Setting Pacing Amplitude: 0.875
Lead Channel Setting Pacing Amplitude: 1.625
Lead Channel Setting Pacing Pulse Width: 0.5 ms
Lead Channel Setting Sensing Sensitivity: 2 mV
Pulse Gen Model: 2272
Pulse Gen Serial Number: 3864545

## 2021-11-21 NOTE — Progress Notes (Signed)
Remote pacemaker transmission.   

## 2021-12-01 ENCOUNTER — Encounter: Payer: Self-pay | Admitting: Family

## 2021-12-01 ENCOUNTER — Other Ambulatory Visit (HOSPITAL_COMMUNITY): Payer: Self-pay

## 2021-12-05 ENCOUNTER — Other Ambulatory Visit: Payer: Self-pay | Admitting: Cardiovascular Disease

## 2021-12-05 DIAGNOSIS — Z86010 Personal history of colonic polyps: Secondary | ICD-10-CM | POA: Insufficient documentation

## 2021-12-05 DIAGNOSIS — I2510 Atherosclerotic heart disease of native coronary artery without angina pectoris: Secondary | ICD-10-CM | POA: Insufficient documentation

## 2021-12-05 NOTE — Progress Notes (Signed)
MEDICARE ANNUAL WELLNESS VISIT AND FOLLOW UP Assessment:   Edwin Webster was seen today for follow-up and medicare wellness.  Diagnoses and all orders for this visit:  Annual Medicare Wellness Visit Due annually  Health maintenance reviewed  CAD Cardiology following  Control blood pressure, cholesterol, glucose, increase exercise.  Denies angina Continue lipid management for miminum LDL <70, ASA  Primary hypertension Continue medication Monitor blood pressure at home; call if consistently over 130/80 Continue DASH diet.   Reminder to go to the ER if any CP, SOB, nausea, dizziness, severe HA, changes vision/speech, left arm numbness and tingling and jaw pain. 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  Check lipid panel.  -     Lipid panel -     TSH  Vitamin D deficiency At goal at recent check; continue to recommend supplementation for goal of 60-100 Defer vitamin D level  Medication management -     CBC with Differential/Platelet -     COMPLETE METABOLIC PANEL WITH GFR -     Magnesium  Abnormal glucose Recent A1Cs at goal Discussed diet/exercise, weight management  Defer A1C; check CMP -     COMPLETE METABOLIC PANEL WITH GFR  BMI 24.0-24.9, adult  Continue to recommend diet heavy in fruits and veggies and low in animal meats, cheeses, and dairy products, appropriate calorie intake Discuss exercise recommendations  routinely Continue to monitor weight at each visit  Iron deficiency anemia due to chronic blood loss Supplement iron as needed; hematology is following -     CBC with Differential/Platelet  History of basal cell cancer Derm following annually   History of colon polyps High fiber diet, next due in 2029 per Dr. Hilarie Fredrickson  Compression fracture, T12 ([place])  Was not recommended kyphoplasty Managing with tylenol and gabapentin for now  Osteoporosis Repeat DEXA 2 years, continue Vit D and Ca, weight bearing exercises On alendronate and tolerating well   Need for pneumococcal vaccine -     prevnar 20 administred without complication today   Itching - dry skin, avoid excess hot showers, moisturize immediately with eucerine, etc, low dose triamcinolone 0.1% BID sparingly for itching.   Orders Placed This Encounter  Procedures   Pneumococcal conjugate vaccine 20-valent (Prevnar 20)   CBC with Differential/Platelet   COMPLETE METABOLIC PANEL WITH GFR   Magnesium   Lipid panel   TSH     Over 30 minutes of exam, counseling, chart review, and critical decision making was performed  Future Appointments  Date Time Provider [place]  01/11/2022 10:15 AM [facility] None  01/11/2022 10:30 AM [place], Rudell Cobb, MD [facility] None  02/08/2022  7:10 AM [facility] DEVICE REMOTES [facility]  05/10/2022  7:10 AM [facility] DEVICE REMOTES [facility]  08/09/2022  7:10 AM [facility] DEVICE REMOTES [facility]  11/08/2022  7:10 AM [facility] DEVICE REMOTES [facility]  12/06/2022 11:00 AM Liane Comber, NP [facility] None  02/07/2023  7:10 AM [facility] DEVICE REMOTES [facility]  05/09/2023  7:10 AM [facility] DEVICE REMOTES [facility]  Plan:   During the course of the visit the patient was educated and counseled about appropriate screening and preventive services including:   Pneumococcal  vaccine  Influenza vaccine Prevnar 13 Td vaccine Screening electrocardiogram Colorectal cancer screening Diabetes screening Glaucoma screening Nutrition counseling    Subjective:  Edwin Webster is a 61 y.o. male who presents for Medicare Annual Wellness Visit and 3 month follow up for HTN, hyperlipidemia, glucose management, and vitamin D Def.   He has basal cell to his nose, had removal scheduled at skin surgery center in Jan 2021 and continues with annual follow up.   He reports dry/itchy skin to bil shins, no rash, had similar last year. Admits to long hot showers to help with back pain, does use moisturizer but will itch/scratch to the point of bleeding.   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 Off of methotrexate, takes NSAID PRN, mostly inhands, and doing well.   He was diagnosed with Waldenstrom's macroglobulinemia 2019 after persistent unexplained fatigue; he now follows with Dr. Marin Olp, Rituxan/bendamustine-s/p cycle 2, Imbruvica 420 mg po q day and reports significant improvement with this, ECOG of 1. He is retired. He has iron deficiency anemia that is also monitored, IV iron as indicated.   Patient is on Alendronate for a thoracic Compression fx (T12, L1) due to Osteoporosis. He did see ortho Dr. Lorin Mercy but was released to PRN follow up, was advised not candidate for kyphoplasty and was released. Has pain but managing with tylenol and gabapentin 300 mg TID.   he has a diagnosis of depression/anxiety and is currently on lexapro 20 mg daily, buspar 5 mg TID PRN, takes BID per patient, been able to taper off of xanax.   BMI is Body mass index is 24.96 kg/m., he has been working on diet and exercise. Walking 30+ min on his property  Wt Readings from Last 3 Encounters:  12/06/21 169 lb (76.7 kg)  10/11/21 169 lb 6.4 oz (76.8 kg)  09/18/21 168 lb 12.8 oz (76.6 kg)   On Sept 2021 Coronary CTA found NonIschemic CAD  and also in Sept 2021 had a PPM inserted for symptomatic Bradycardia/sick sinus syndrome( Dr Curt Bears - Cardiologist )  His blood pressure has been controlled at home, today their BP is BP: 124/72 He does workout. He denies chest pain, shortness of breath, dizziness.   He is on cholesterol medication (rosuvastatin 40 mg daily and zetia 10 mg daily) and denies myalgias. His cholesterol is at goal. The cholesterol last visit was:   Lab Results  Component Value Date   CHOL 114 09/18/2021   HDL 41 09/18/2021   LDLCALC 60 09/18/2021   TRIG 57 09/18/2021   CHOLHDL 2.8 09/18/2021   He has been working on diet and exercise for glucose management, and denies nausea, paresthesia of the feet, polydipsia, polyuria, visual disturbances and vomiting. Last A1C in the office was:  Lab Results  Component Value Date   HGBA1C 5.3 09/18/2021   Last GFR Lab Results  Component Value Date   GFRNONAA >60 10/11/2021   Patient is on Vitamin D supplement, taking 10000 IU dailydaily only Lab Results  Component Value Date   VD25OH 65 09/18/2021     CBC Latest Ref Rng & Units 10/11/2021 07/21/2021 04/18/2021  WBC 4.0 - 10.5 K/uL 4.9 4.8 5.7  Hemoglobin 13.0 - 17.0 g/dL 13.2 13.5 13.6  Hematocrit 39.0 - 52.0 % 39.2 40.1 40.4  Platelets 150 - 400 K/uL 220 203 228   Lab Results  Component Value Date   IRON 75 10/11/2021   TIBC 309 10/11/2021   FERRITIN 122 10/11/2021     Medication Review:  Current Outpatient Medications (Endocrine & Metabolic):    alendronate (FOSAMAX) 70 MG tablet, Take 1 tablet (70 mg total) by mouth every 7 (seven) days. Take with a full glass of water on an empty stomach and avoid reclining or lying down for 2 hours after taking this medication.  Current Outpatient Medications (Cardiovascular):    ezetimibe (ZETIA) 10 MG tablet, TAKE 1 TABLET BY MOUTH EVERY DAY   olmesartan (BENICAR) 40 MG tablet, Take 40 mg by mouth at bedtime. For BP   rosuvastatin (CRESTOR) 40 MG tablet, TAKE  1 TABLET BY MOUTH EVERY DAY FOR CHOLESTEROL  Current Outpatient Medications (Respiratory):    ipratropium (ATROVENT) 0.06 % nasal spray, Use 1 to 2 sprays each nostril 2 to 3 x /day as needed   levocetirizine (XYZAL) 5 MG tablet, TAKE 1 TABLET BY MOUTH DAILY FOR ALLERGIES  Current Outpatient Medications (Analgesics):    aspirin EC 81 MG tablet, Take 81 mg daily by mouth.  Current Outpatient Medications (Hematological):    folic acid (FOLVITE) 1 MG tablet, TAKE 1 TABLET DAILY FOR FOLATE DEFICIENCY   vitamin B-12 (CYANOCOBALAMIN) 500 MCG tablet, Take 500 mcg by mouth daily.  Current Outpatient Medications (Other):    busPIRone (BUSPAR) 7.5 MG tablet, Take  1 tablet  3 x /day  for Chronic Anxiety   Cholecalciferol (VITAMIN D-3) 5000 UNITS TABS, Take 5,000 Units by mouth 2 (two) times daily.    escitalopram (LEXAPRO) 20 MG tablet, TAKE 1 TABLET BY MOUTH DAILY FOR MOOD   famciclovir (FAMVIR) 500 MG tablet, TAKE 1 TABLET BY MOUTH EVERY DAY   famotidine (PEPCID) 40 MG tablet, Take 1 tabet Daily for Heartburn and Indigestion   gabapentin (NEURONTIN) 300 MG capsule, Take 300 mg by mouth 3 (three) times daily.   ibrutinib (IMBRUVICA) 280 MG TABS, TAKE 1 TABLET (280 MG) BY MOUTH DAILY.  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  Screening Tests Immunization History  Administered Date(s) Administered   DTaP 06/04/2011   Hepatitis B 11/26/2010   Hepatitis B, ped/adol 08/06/2011   Influenza Inj Mdck Quad With Preservative 09/13/2017, 08/21/2018, 09/01/2020   Influenza Split 09/02/2014   Influenza Whole 09/17/2013   Influenza, Seasonal, Injecte, Preservative Fre 10/11/2015   Influenza-Unspecified 09/13/2017   PFIZER(Purple Top)SARS-COV-2 Vaccination 01/24/2020, 03/06/2020, 08/03/2020   PPD Test 10/11/2015, 08/05/2019, 09/01/2020   Pneumococcal Polysaccharide-23 12/06/2020    Preventative care: Last colonoscopy: 02/24/2021, hx of polyps, Dr. Hilarie Fredrickson, 7 year follow up  DEXA: 06/22/2021, T -2.9, on osteoporosis, on alendronate  Prior vaccinations: TD or Tdap: 05/2011 will get PRN due to cost Influenza: 09/2021 at pharmacy, will send report Pneumococcal: 12/06/2020 Prevnar 20: TODAY due to immunosuppressed Shingles/Zostavax: contraindicated, takes famciclovir daily prophylaxis  Covid 19: 2/2 + booster  x 2, most recent requested  Names of Other Physician/Practitioners you currently use: 1. [facility] here for primary care 2. Dr. Gershon Crane, eye doctor, last visit 2022, glasses 3. none, dentist, last visit remote, full dentures  Patient Care Team: Unk Pinto, MD as PCP - General (Internal Medicine) Constance Haw, MD as PCP - Electrophysiology (Cardiology)  Surgical: He  has a past surgical history that includes Rotator cuff repair (1996); Knee surgery; Finger surgery; iron infusion; PACEMAKER IMPLANT (N/A, 08/10/2020); Wisdom tooth extraction; and Colonoscopy (2016). Family His family history includes Breast cancer in his mother; COPD in his maternal grandfather and mother; Colon cancer (age of onset: 37) in his maternal grandfather; Colon polyps in his brother and mother; Colon polyps (age of onset: 64) in his daughter; Colon polyps (age of  onset: 56) in his maternal grandfather; Heart failure in his mother; Hyperlipidemia in his brother and mother; Hypertension in his father; Rectal cancer in his mother; Stroke in his maternal grandfather. Social history  He reports that he quit smoking about 11 years ago. His smoking use included cigarettes. He smoked an average of 2 packs per day. He has never used smokeless tobacco. He reports that he does not drink alcohol and does not use drugs.  MEDICARE WELLNESS OBJECTIVES: Physical activity: Current Exercise Habits: Home exercise routine, Type of exercise: walking, Time (Minutes): 30, Frequency (Times/Week): 7, Weekly Exercise (Minutes/Week): 210, Intensity: Mild, Exercise limited by: orthopedic condition(s) Cardiac risk factors: Cardiac Risk Factors include: advanced age (>53men, >42 women);dyslipidemia;hypertension;male gender;smoking/ tobacco exposure Depression/mood screen:   Depression screen [facility] 2/9 12/06/2021  Decreased Interest 0  Down, Depressed, Hopeless 0  PHQ - 2 Score 0  Some recent data might be hidden    ADLs:  In your present state of health, do you have any difficulty performing the following activities: 12/06/2021 03/13/2021  Hearing? N N  Vision? N N  Difficulty concentrating or making decisions? N N  Walking or climbing stairs? N N  Dressing or bathing? N N  Doing errands, shopping? N N  Some recent data might be hidden     Cognitive Testing  Alert? Yes  Normal Appearance?Yes  Oriented to person? Yes  Place? Yes   Time? Yes  Recall of three objects?  Yes  Can perform simple calculations? Yes  Displays appropriate judgment?Yes  Can read the correct time from a watch face?Yes  EOL planning: Does Patient Have a Medical Advance Directive?: Yes Type of Advance Directive: Healthcare Power of Attorney, Living will Does patient want to make changes to medical advance directive?: No - Patient declined Copy of [place] in Chart?: Yes - validated  most recent copy scanned in chart (See row information)   Objective:   Today's Vitals   12/06/21 1116 12/06/21 1152  BP: 140/90 124/72  Pulse: 60   Temp: 97.7 F (36.5 C)   SpO2: 99%   Weight: 169 lb (76.7 kg)     Body mass index is 24.96 kg/m.  General appearance: alert, no distress, WD/WN, male HEENT: normocephalic, sclerae anicteric, TMs pearly, nares patent, no discharge or erythema, pharynx normal Oral cavity: MMM, no lesions Neck: supple, no lymphadenopathy, no thyromegaly, no masses Heart: RRR, normal S1, S2, no murmurs Lungs: CTA bilaterally, no wheezes, rhonchi, or rales Abdomen: +bs, soft, non tender, non distended, no masses, no hepatomegaly, no splenomegaly Musculoskeletal: nontender, no swelling, no obvious deformity Extremities: no edema, no cyanosis, no clubbing Pulses: 2+ symmetric, upper and lower  extremities, normal cap refill Neurological: alert, oriented x 3, CN2-12 intact, strength normal upper extremities and lower extremities, sensation normal throughout, DTRs 2+ throughout, no cerebellar signs, gait normal Psychiatric: normal affect, behavior normal, pleasant   Medicare Attestation I have personally reviewed: The patient's medical and social history Their use of alcohol, tobacco or illicit drugs Their current medications and supplements The patient's functional ability including ADLs,fall risks, home safety risks, cognitive, and hearing and visual impairment Diet and physical activities Evidence for depression or mood disorders  The patient's weight, height, BMI, and visual acuity have been recorded in the chart.  I have made referrals, counseling, and provided education to the patient based on review of the above and I have provided the patient with a written personalized care plan for preventive services.     [name], NP   12/06/2021

## 2021-12-06 ENCOUNTER — Ambulatory Visit (INDEPENDENT_AMBULATORY_CARE_PROVIDER_SITE_OTHER): Payer: Medicare HMO | Admitting: Adult Health

## 2021-12-06 ENCOUNTER — Other Ambulatory Visit: Payer: Self-pay

## 2021-12-06 ENCOUNTER — Encounter: Payer: Self-pay | Admitting: Family

## 2021-12-06 ENCOUNTER — Encounter: Payer: Self-pay | Admitting: Adult Health

## 2021-12-06 VITALS — BP 124/72 | HR 60 | Temp 97.7°F | Wt 169.0 lb

## 2021-12-06 DIAGNOSIS — Z85828 Personal history of other malignant neoplasm of skin: Secondary | ICD-10-CM

## 2021-12-06 DIAGNOSIS — R7309 Other abnormal glucose: Secondary | ICD-10-CM

## 2021-12-06 DIAGNOSIS — M0600 Rheumatoid arthritis without rheumatoid factor, unspecified site: Secondary | ICD-10-CM

## 2021-12-06 DIAGNOSIS — Z23 Encounter for immunization: Secondary | ICD-10-CM | POA: Diagnosis not present

## 2021-12-06 DIAGNOSIS — Z86010 Personal history of colonic polyps: Secondary | ICD-10-CM

## 2021-12-06 DIAGNOSIS — R001 Bradycardia, unspecified: Secondary | ICD-10-CM

## 2021-12-06 DIAGNOSIS — E782 Mixed hyperlipidemia: Secondary | ICD-10-CM

## 2021-12-06 DIAGNOSIS — Z0001 Encounter for general adult medical examination with abnormal findings: Secondary | ICD-10-CM

## 2021-12-06 DIAGNOSIS — C880 Waldenstrom macroglobulinemia: Secondary | ICD-10-CM

## 2021-12-06 DIAGNOSIS — I495 Sick sinus syndrome: Secondary | ICD-10-CM | POA: Insufficient documentation

## 2021-12-06 DIAGNOSIS — Z0000 Encounter for general adult medical examination without abnormal findings: Secondary | ICD-10-CM

## 2021-12-06 DIAGNOSIS — I2510 Atherosclerotic heart disease of native coronary artery without angina pectoris: Secondary | ICD-10-CM

## 2021-12-06 DIAGNOSIS — R6889 Other general symptoms and signs: Secondary | ICD-10-CM

## 2021-12-06 DIAGNOSIS — Z6824 Body mass index (BMI) 24.0-24.9, adult: Secondary | ICD-10-CM

## 2021-12-06 DIAGNOSIS — I10 Essential (primary) hypertension: Secondary | ICD-10-CM

## 2021-12-06 DIAGNOSIS — F330 Major depressive disorder, recurrent, mild: Secondary | ICD-10-CM

## 2021-12-06 DIAGNOSIS — D500 Iron deficiency anemia secondary to blood loss (chronic): Secondary | ICD-10-CM

## 2021-12-06 DIAGNOSIS — Z79899 Other long term (current) drug therapy: Secondary | ICD-10-CM

## 2021-12-06 DIAGNOSIS — S22080A Wedge compression fracture of T11-T12 vertebra, initial encounter for closed fracture: Secondary | ICD-10-CM

## 2021-12-06 DIAGNOSIS — E559 Vitamin D deficiency, unspecified: Secondary | ICD-10-CM

## 2021-12-06 DIAGNOSIS — M8000XD Age-related osteoporosis with current pathological fracture, unspecified site, subsequent encounter for fracture with routine healing: Secondary | ICD-10-CM

## 2021-12-06 MED ORDER — TRIAMCINOLONE ACETONIDE 0.1 % EX CREA: 1.0000 "application " | TOPICAL_CREAM | Freq: Two times a day (BID) | CUTANEOUS | 0 refills | Status: DC

## 2021-12-06 MED ORDER — OLMESARTAN MEDOXOMIL 40 MG PO TABS: 40.0000 mg | ORAL_TABLET | Freq: Every day | ORAL | 3 refills | Status: DC

## 2021-12-07 LAB — LIPID PANEL
Cholesterol: 169 mg/dL (ref <200)
HDL: 48 mg/dL (ref >=40)
LDL Cholesterol (Calc): 106 mg/dL (calc) — ABNORMAL HIGH
Non-HDL Cholesterol (Calc): 121 mg/dL (calc) (ref <130)
Total CHOL/HDL Ratio: 3.5 (calc) (ref <5.0)
Triglycerides: 61 mg/dL (ref <150)

## 2021-12-07 LAB — COMPLETE METABOLIC PANEL WITH GFR
AG Ratio: 2.1 (calc) (ref 1.0–2.5)
ALT: 18 U/L (ref 9–46)
AST: 26 U/L (ref 10–35)
Albumin: 4.5 g/dL (ref 3.6–5.1)
Alkaline phosphatase (APISO): 76 U/L (ref 35–144)
BUN: 12 mg/dL (ref 7–25)
CO2: 24 mmol/L (ref 20–32)
Calcium: 9.4 mg/dL (ref 8.6–10.3)
Chloride: 105 mmol/L (ref 98–110)
Creat: 1.15 mg/dL (ref 0.70–1.35)
Globulin: 2.1 g/dL (calc) (ref 1.9–3.7)
Glucose, Bld: 76 mg/dL (ref 65–99)
Potassium: 4.4 mmol/L (ref 3.5–5.3)
Sodium: 140 mmol/L (ref 135–146)
Total Bilirubin: 0.9 mg/dL (ref 0.2–1.2)
Total Protein: 6.6 g/dL (ref 6.1–8.1)
eGFR: 72 mL/min/{1.73_m2} (ref >=60)

## 2021-12-07 LAB — CBC WITH DIFFERENTIAL/PLATELET
Absolute Monocytes: 536 cells/uL (ref 200–950)
Basophils Absolute: 31 cells/uL (ref 0–200)
Basophils Relative: 0.6 %
Eosinophils Absolute: 71 cells/uL (ref 15–500)
Eosinophils Relative: 1.4 %
HCT: 39.8 % (ref 38.5–50.0)
Hemoglobin: 13.6 g/dL (ref 13.2–17.1)
Lymphs Abs: 898 cells/uL (ref 850–3900)
MCH: 33.5 pg — ABNORMAL HIGH (ref 27.0–33.0)
MCHC: 34.2 g/dL (ref 32.0–36.0)
MCV: 98 fL (ref 80.0–100.0)
MPV: 10.8 fL (ref 7.5–12.5)
Monocytes Relative: 10.5 %
Neutro Abs: 3565 cells/uL (ref 1500–7800)
Neutrophils Relative %: 69.9 %
Platelets: 233 10*3/uL (ref 140–400)
RBC: 4.06 10*6/uL — ABNORMAL LOW (ref 4.20–5.80)
RDW: 13.5 % (ref 11.0–15.0)
Total Lymphocyte: 17.6 %
WBC: 5.1 10*3/uL (ref 3.8–10.8)

## 2021-12-07 LAB — MAGNESIUM: Magnesium: 2.3 mg/dL (ref 1.5–2.5)

## 2021-12-07 LAB — TSH: TSH: 2.55 mIU/L (ref 0.40–4.50)

## 2021-12-22 ENCOUNTER — Other Ambulatory Visit (HOSPITAL_COMMUNITY): Payer: Self-pay

## 2022-01-03 ENCOUNTER — Other Ambulatory Visit: Payer: Self-pay | Admitting: Internal Medicine

## 2022-01-04 ENCOUNTER — Encounter: Payer: Self-pay | Admitting: Family

## 2022-01-04 ENCOUNTER — Telehealth: Payer: Self-pay | Admitting: Cardiology

## 2022-01-04 NOTE — Telephone Encounter (Signed)
Advised to sent in manual PPM transmission to see what, if anything, occurred with his heart at the time of this event. Aware will forward to device clinic to be on the look out for transmission and any findings. Pt aware I will be in touch once device clinic reviews and forwards back to me. Patient verbalized understanding and agreeable to plan.   Very short burst of afib have been seen on past transmissions

## 2022-01-04 NOTE — Telephone Encounter (Signed)
Pt made aware, he will follow up with PCP about incident this morning.

## 2022-01-04 NOTE — Telephone Encounter (Signed)
Patient called to check on status of call. Advised him it's been routed to the nurse.

## 2022-01-04 NOTE — Telephone Encounter (Signed)
Manual transmission received/ reviewed.  Normal device function. Presenting rate SR 82bpm.  Most recent AF episode was 12/12/21 Nothing in report to explain patient symptoms today.

## 2022-01-04 NOTE — Telephone Encounter (Signed)
STAT if HR is under 50 or over 120 (normal HR is 60-100 beats per minute)  What is your heart rate? 61   Do you have a log of your heart rate readings (document readings)?  1:30 PM 87  Do you have any other symptoms? Sweating and shaking to the point he couldn't eat a sandwich    States he has never had this before. Began shaking to the point he couldn't eat and began sweating. This lasted for 30 mins then his HR dropped back down to 61.

## 2022-01-11 ENCOUNTER — Inpatient Hospital Stay (HOSPITAL_BASED_OUTPATIENT_CLINIC_OR_DEPARTMENT_OTHER): Payer: Medicare HMO | Admitting: Hematology & Oncology

## 2022-01-11 ENCOUNTER — Encounter: Payer: Self-pay | Admitting: Hematology & Oncology

## 2022-01-11 ENCOUNTER — Other Ambulatory Visit: Payer: Self-pay

## 2022-01-11 ENCOUNTER — Inpatient Hospital Stay: Payer: Medicare HMO | Attending: Hematology & Oncology

## 2022-01-11 ENCOUNTER — Other Ambulatory Visit (HOSPITAL_COMMUNITY): Payer: Self-pay

## 2022-01-11 VITALS — BP 120/68 | HR 63 | Temp 98.0°F | Resp 18 | Ht 69.0 in | Wt 168.8 lb

## 2022-01-11 DIAGNOSIS — Z79899 Other long term (current) drug therapy: Secondary | ICD-10-CM | POA: Diagnosis not present

## 2022-01-11 DIAGNOSIS — R42 Dizziness and giddiness: Secondary | ICD-10-CM | POA: Diagnosis not present

## 2022-01-11 DIAGNOSIS — C880 Waldenstrom macroglobulinemia: Secondary | ICD-10-CM

## 2022-01-11 DIAGNOSIS — D509 Iron deficiency anemia, unspecified: Secondary | ICD-10-CM | POA: Diagnosis not present

## 2022-01-11 DIAGNOSIS — D500 Iron deficiency anemia secondary to blood loss (chronic): Secondary | ICD-10-CM

## 2022-01-11 LAB — CBC WITH DIFFERENTIAL (CANCER CENTER ONLY)
Abs Immature Granulocytes: 0.03 10*3/uL (ref 0.00–0.07)
Basophils Absolute: 0.1 10*3/uL (ref 0.0–0.1)
Basophils Relative: 2 %
Eosinophils Absolute: 0.1 10*3/uL (ref 0.0–0.5)
Eosinophils Relative: 3 %
HCT: 36.7 % — ABNORMAL LOW (ref 39.0–52.0)
Hemoglobin: 12.4 g/dL — ABNORMAL LOW (ref 13.0–17.0)
Immature Granulocytes: 1 %
Lymphocytes Relative: 21 %
Lymphs Abs: 0.8 10*3/uL (ref 0.7–4.0)
MCH: 32.6 pg (ref 26.0–34.0)
MCHC: 33.8 g/dL (ref 30.0–36.0)
MCV: 96.6 fL (ref 80.0–100.0)
Monocytes Absolute: 0.5 10*3/uL (ref 0.1–1.0)
Monocytes Relative: 14 %
Neutro Abs: 2.2 10*3/uL (ref 1.7–7.7)
Neutrophils Relative %: 59 %
Platelet Count: 202 10*3/uL (ref 150–400)
RBC: 3.8 MIL/uL — ABNORMAL LOW (ref 4.22–5.81)
RDW: 13.1 % (ref 11.5–15.5)
WBC Count: 3.7 10*3/uL — ABNORMAL LOW (ref 4.0–10.5)
nRBC: 0 % (ref 0.0–0.2)

## 2022-01-11 LAB — LACTATE DEHYDROGENASE: LDH: 168 U/L (ref 98–192)

## 2022-01-11 LAB — CMP (CANCER CENTER ONLY)
ALT: 33 U/L (ref 0–44)
AST: 27 U/L (ref 15–41)
Albumin: 4.2 g/dL (ref 3.5–5.0)
Alkaline Phosphatase: 74 U/L (ref 38–126)
Anion gap: 6 (ref 5–15)
BUN: 11 mg/dL (ref 8–23)
CO2: 28 mmol/L (ref 22–32)
Calcium: 9.3 mg/dL (ref 8.9–10.3)
Chloride: 103 mmol/L (ref 98–111)
Creatinine: 1.16 mg/dL (ref 0.61–1.24)
GFR, Estimated: >60 mL/min (ref >60)
Glucose, Bld: 96 mg/dL (ref 70–99)
Potassium: 4.4 mmol/L (ref 3.5–5.1)
Sodium: 137 mmol/L (ref 135–145)
Total Bilirubin: 0.7 mg/dL (ref 0.3–1.2)
Total Protein: 6.6 g/dL (ref 6.5–8.1)

## 2022-01-11 LAB — IRON AND IRON BINDING CAPACITY (CC-WL,HP ONLY)
Iron: 107 ug/dL (ref 45–182)
Saturation Ratios: 29 % (ref 17.9–39.5)
TIBC: 374 ug/dL (ref 250–450)
UIBC: 267 ug/dL (ref 117–376)

## 2022-01-11 LAB — RETICULOCYTES
Immature Retic Fract: 8.7 % (ref 2.3–15.9)
RBC.: 3.87 MIL/uL — ABNORMAL LOW (ref 4.22–5.81)
Retic Count, Absolute: 46.8 10*3/uL (ref 19.0–186.0)
Retic Ct Pct: 1.2 % (ref 0.4–3.1)

## 2022-01-11 LAB — FERRITIN: Ferritin: 58 ng/mL (ref 24–336)

## 2022-01-11 NOTE — Progress Notes (Signed)
Hematology and Oncology Follow Up Visit  Edwin Webster [id number] Aug 03, 1961 61 y.o. 01/11/2022   Principle Diagnosis:  Waldenstrom's macroglobulinemia Iron deficiency anemia  Current Therapy:   Rituxan/bendamustine-s/p cycle 2 -- d/c on 06/11/2018 Imbruvica  280 mg po q day -- changed on 08/06/2019 IV iron as indicated - last received in March 2022     Interim History:  Edwin Webster is here today for follow-up.  He is doing pretty well.  He is a little worried about his slacks becoming loose on him.  He says his waistline is dropped.  His weight has not gone down however.  He is on [place].  He is doing well with the Imbruvica.  When we last saw him, his monoclonal spike was 0.3 g/dL.  The IgM level was 450 mg/dL.  He has had no problem with fever.  He has had no rashes.  He has had no change in bowel or bladder habits.  There is been no diarrhea.  His last iron studies that were done back in November showed a ferritin of 122 with an iron saturation of 24%.  He has had no headache.  There is been no visual changes.  Still has little bit of dizziness.  He has not fallen.  Overall, his performance status is ECOG 1.  Medications:  Allergies as of 01/11/2022       Reactions   Adalimumab Other (See Comments)   "blacked out", confused        Medication List        Accurate as of January 11, 2022 11:18 AM. If you have any questions, ask your nurse or doctor.          alendronate 70 MG tablet Commonly known as: Fosamax Take 1 tablet (70 mg total) by mouth every 7 (seven) days. Take with a full glass of water on an empty stomach and avoid reclining or lying down for 2 hours after taking this medication.   aspirin EC 81 MG tablet Take 81 mg daily by mouth.   busPIRone 7.5 MG tablet Commonly known as: BUSPAR Take  1 tablet  3 x /day  for Chronic Anxiety   escitalopram 20 MG tablet Commonly known as: LEXAPRO TAKE 1 TABLET BY MOUTH DAILY FOR MOOD   ezetimibe 10 MG  tablet Commonly known as: ZETIA TAKE 1 TABLET BY MOUTH EVERY DAY   famciclovir 500 MG tablet Commonly known as: FAMVIR TAKE 1 TABLET BY MOUTH EVERY DAY   famotidine 40 MG tablet Commonly known as: PEPCID Take 1 tabet Daily for Heartburn and Indigestion   folic acid 1 MG tablet Commonly known as: FOLVITE TAKE 1 TABLET DAILY FOR FOLATE DEFICIENCY   gabapentin 300 MG capsule Commonly known as: NEURONTIN Take 300 mg by mouth 3 (three) times daily.   Imbruvica 280 MG tablet Generic drug: ibrutinib TAKE 1 TABLET (280 MG) BY MOUTH DAILY.   ipratropium 0.06 % nasal spray Commonly known as: ATROVENT Use 1 to 2 sprays each nostril 2 to 3 x /day as needed   levocetirizine 5 MG tablet Commonly known as: XYZAL TAKE 1 TABLET BY MOUTH DAILY FOR ALLERGIES   olmesartan 40 MG tablet Commonly known as: BENICAR Take 1 tablet (40 mg total) by mouth at bedtime. For BP   rosuvastatin 40 MG tablet Commonly known as: CRESTOR TAKE 1 TABLET BY MOUTH EVERY DAY FOR CHOLESTEROL   triamcinolone cream 0.1 % Commonly known as: KENALOG Apply 1 application topically 2 (two) times daily.  vitamin B-12 500 MCG tablet Commonly known as: CYANOCOBALAMIN Take 500 mcg by mouth daily.   Vitamin D-3 125 MCG (5000 UT) Tabs Take 5,000 Units by mouth 2 (two) times daily.        Allergies:  Allergies  Allergen Reactions   Adalimumab Other (See Comments)    "blacked out", confused     Past Medical History, Surgical history, Social history, and Family History were reviewed and updated.  Review of Systems: Review of Systems  Constitutional: Negative.   HENT: Negative.    Eyes: Negative.   Respiratory: Negative.    Cardiovascular: Negative.   Gastrointestinal: Negative.   Genitourinary: Negative.   Musculoskeletal: Negative.   Skin: Negative.   Neurological: Negative.   Endo/Heme/Allergies: Negative.   Psychiatric/Behavioral: Negative.       Physical Exam:  height is 5\' 9"  (1.753 m)  and weight is 168 lb 12.8 oz (76.6 kg). His oral temperature is 98 F (36.7 C). His blood pressure is 120/68 and his pulse is 63. His respiration is 18 and oxygen saturation is 100%.   Wt Readings from Last 3 Encounters:  01/11/22 168 lb 12.8 oz (76.6 kg)  12/06/21 169 lb (76.7 kg)  10/11/21 169 lb 6.4 oz (76.8 kg)  Well then  Physical Exam Vitals reviewed.  HENT:     Head: Normocephalic and atraumatic.  Eyes:     Pupils: Pupils are equal, round, and reactive to light.  Cardiovascular:     Rate and Rhythm: Normal rate and regular rhythm.     Heart sounds: Normal heart sounds.  Pulmonary:     Effort: Pulmonary effort is normal.     Breath sounds: Normal breath sounds.  Abdominal:     General: Bowel sounds are normal.     Palpations: Abdomen is soft.  Musculoskeletal:        General: No tenderness or deformity. Normal range of motion.     Cervical back: Normal range of motion.  Lymphadenopathy:     Cervical: No cervical adenopathy.  Skin:    General: Skin is warm and dry.     Findings: No erythema or rash.  Neurological:     Mental Status: He is alert and oriented to person, place, and time.  Psychiatric:        Behavior: Behavior normal.        Thought Content: Thought content normal.        Judgment: Judgment normal.     Lab Results  Component Value Date   WBC 3.7 (L) 01/11/2022   HGB 12.4 (L) 01/11/2022   HCT 36.7 (L) 01/11/2022   MCV 96.6 01/11/2022   PLT 202 01/11/2022   Lab Results  Component Value Date   FERRITIN 122 10/11/2021   IRON 75 10/11/2021   TIBC 309 10/11/2021   UIBC 234 10/11/2021   IRONPCTSAT 24 10/11/2021   Lab Results  Component Value Date   RETICCTPCT 1.2 01/11/2022   RBC 3.87 (L) 01/11/2022   RETICCTABS 33,120 02/12/2018   Lab Results  Component Value Date   KPAFRELGTCHN 28.9 (H) 10/11/2021   LAMBDASER 9.4 10/11/2021   KAPLAMBRATIO 3.07 (H) 10/11/2021   Lab Results  Component Value Date   IGGSERUM 516 (L) 10/11/2021   IGA 36  (L) 10/11/2021   IGMSERUM 416 (H) 10/11/2021   Lab Results  Component Value Date   TOTALPROTELP 6.3 10/11/2021   ALBUMINELP 3.9 10/11/2021   A1GS 0.2 10/11/2021   A2GS 0.7 10/11/2021   BETS 0.8 10/11/2021  BETA2SER 0.2 02/18/2018   GAMS 0.7 10/11/2021   MSPIKE 0.3 (H) 10/11/2021   SPEI Comment 07/09/2018     Chemistry      Component Value Date/Time   NA 137 01/11/2022 1002   NA 141 08/04/2020 1203   K 4.4 01/11/2022 1002   CL 103 01/11/2022 1002   CO2 28 01/11/2022 1002   BUN 11 01/11/2022 1002   BUN 13 08/04/2020 1203   CREATININE 1.16 01/11/2022 1002   CREATININE 1.15 12/06/2021 1219      Component Value Date/Time   CALCIUM 9.3 01/11/2022 1002   ALKPHOS 74 01/11/2022 1002   AST 27 01/11/2022 1002   ALT 33 01/11/2022 1002   BILITOT 0.7 01/11/2022 1002      Impression and Plan: Edwin Webster is a very pleasant 61 yo caucasian gentleman with Waldenstrom's macroglobulinemia.   We will see what the monoclonal studies show.  I believe that the Waldenstrom's should still be doing well.  We will check his iron studies also.  We will plan for another follow-up in a couple months.  We will try to get him back in April.     [name], MD 2/16/202311:18 AM

## 2022-01-12 LAB — PROTEIN ELECTROPHORESIS, SERUM
A/G Ratio: 1.6 (ref 0.7–1.7)
Albumin ELP: 4.1 g/dL (ref 2.9–4.4)
Alpha-1-Globulin: 0.2 g/dL (ref 0.0–0.4)
Alpha-2-Globulin: 0.7 g/dL (ref 0.4–1.0)
Beta Globulin: 0.9 g/dL (ref 0.7–1.3)
Gamma Globulin: 0.8 g/dL (ref 0.4–1.8)
Globulin, Total: 2.6 g/dL (ref 2.2–3.9)
M-Spike, %: 0.4 g/dL — ABNORMAL HIGH
Total Protein ELP: 6.7 g/dL (ref 6.0–8.5)

## 2022-01-12 LAB — KAPPA/LAMBDA LIGHT CHAINS
Kappa free light chain: 26.9 mg/L — ABNORMAL HIGH (ref 3.3–19.4)
Kappa, lambda light chain ratio: 2.26 — ABNORMAL HIGH (ref 0.26–1.65)
Lambda free light chains: 11.9 mg/L (ref 5.7–26.3)

## 2022-01-12 LAB — IGG, IGA, IGM
IgA: 42 mg/dL — ABNORMAL LOW (ref 61–437)
IgG (Immunoglobin G), Serum: 566 mg/dL — ABNORMAL LOW (ref 603–1613)
IgM (Immunoglobulin M), Srm: 386 mg/dL — ABNORMAL HIGH (ref 20–172)

## 2022-01-17 ENCOUNTER — Other Ambulatory Visit: Payer: Self-pay | Admitting: Internal Medicine

## 2022-01-19 ENCOUNTER — Encounter: Payer: Self-pay | Admitting: Family

## 2022-01-19 ENCOUNTER — Other Ambulatory Visit (HOSPITAL_COMMUNITY): Payer: Self-pay

## 2022-01-22 ENCOUNTER — Telehealth: Payer: Self-pay | Admitting: Pharmacy Technician

## 2022-01-22 ENCOUNTER — Encounter: Payer: Self-pay | Admitting: Family

## 2022-01-22 ENCOUNTER — Other Ambulatory Visit (HOSPITAL_COMMUNITY): Payer: Self-pay

## 2022-01-22 NOTE — Telephone Encounter (Addendum)
Oral Oncology Patient Advocate Encounter  Was successful in securing patient a $8000 grant from [employer] to provide copayment coverage for Imbruvica.  This will keep the out of pocket expense at $0.     [employer] ID: [id number]  Tried calling patient to let him know of approval.  Had to leave voicemail.   The billing information is as follows and has been shared with [place].    RxBin: [id number] PCN: [id number] Member ID: [id number] Group ID: [id number] Dates of Eligibility: 12/23/21 through 12/22/22  Fund:  [place] [facility] [place] Phone [phone] Fax [phone] 01/22/2022 12:33 PM

## 2022-01-23 ENCOUNTER — Other Ambulatory Visit (HOSPITAL_COMMUNITY): Payer: Self-pay

## 2022-02-08 ENCOUNTER — Ambulatory Visit (INDEPENDENT_AMBULATORY_CARE_PROVIDER_SITE_OTHER): Payer: Medicare HMO

## 2022-02-08 DIAGNOSIS — I495 Sick sinus syndrome: Secondary | ICD-10-CM | POA: Diagnosis not present

## 2022-02-08 LAB — CUP PACEART REMOTE DEVICE CHECK
Battery Remaining Longevity: 110 mo
Battery Remaining Percentage: 90 %
Battery Voltage: 3.01 V
Brady Statistic AP VP Percent: 1 %
Brady Statistic AP VS Percent: 78 %
Brady Statistic AS VP Percent: 1 %
Brady Statistic AS VS Percent: 22 %
Brady Statistic RA Percent Paced: 78 %
Brady Statistic RV Percent Paced: 1 %
Date Time Interrogation Session: 20230316025731
Implantable Lead Implant Date: 20210915
Implantable Lead Implant Date: 20210915
Implantable Lead Location: 753859
Implantable Lead Location: 753860
Implantable Pulse Generator Implant Date: 20210915
Lead Channel Impedance Value: 410 Ohm
Lead Channel Impedance Value: 510 Ohm
Lead Channel Pacing Threshold Amplitude: 0.5 V
Lead Channel Pacing Threshold Amplitude: 0.625 V
Lead Channel Pacing Threshold Pulse Width: 0.5 ms
Lead Channel Pacing Threshold Pulse Width: 0.5 ms
Lead Channel Sensing Intrinsic Amplitude: 10.9 mV
Lead Channel Sensing Intrinsic Amplitude: 2.9 mV
Lead Channel Setting Pacing Amplitude: 0.875
Lead Channel Setting Pacing Amplitude: 1.5 V
Lead Channel Setting Pacing Pulse Width: 0.5 ms
Lead Channel Setting Sensing Sensitivity: 2 mV
Pulse Gen Model: 2272
Pulse Gen Serial Number: 3864545

## 2022-02-09 ENCOUNTER — Other Ambulatory Visit: Payer: Self-pay | Admitting: Adult Health

## 2022-02-09 ENCOUNTER — Other Ambulatory Visit: Payer: Self-pay | Admitting: Internal Medicine

## 2022-02-09 DIAGNOSIS — J302 Other seasonal allergic rhinitis: Secondary | ICD-10-CM

## 2022-02-09 MED ORDER — LEVOCETIRIZINE DIHYDROCHLORIDE 5 MG PO TABS: ORAL_TABLET | ORAL | 3 refills | Status: DC

## 2022-02-13 ENCOUNTER — Other Ambulatory Visit (HOSPITAL_COMMUNITY): Payer: Self-pay

## 2022-02-16 NOTE — Progress Notes (Signed)
Remote pacemaker transmission.   

## 2022-03-05 ENCOUNTER — Other Ambulatory Visit: Payer: Self-pay | Admitting: Hematology & Oncology

## 2022-03-05 ENCOUNTER — Other Ambulatory Visit (HOSPITAL_COMMUNITY): Payer: Self-pay

## 2022-03-05 DIAGNOSIS — C880 Waldenstrom macroglobulinemia: Secondary | ICD-10-CM

## 2022-03-05 MED ORDER — IBRUTINIB 280 MG PO TABS
ORAL_TABLET | ORAL | 3 refills | Status: DC
  Filled 2022-03-06: qty 28, 28d supply, fill #0
  Filled 2022-04-05: qty 28, 28d supply, fill #1
  Filled 2022-05-01: qty 28, 28d supply, fill #2
  Filled 2022-05-30: qty 28, 28d supply, fill #3

## 2022-03-06 ENCOUNTER — Other Ambulatory Visit (HOSPITAL_COMMUNITY): Payer: Self-pay

## 2022-03-11 ENCOUNTER — Other Ambulatory Visit: Payer: Self-pay | Admitting: Internal Medicine

## 2022-03-13 ENCOUNTER — Other Ambulatory Visit (HOSPITAL_COMMUNITY): Payer: Self-pay

## 2022-03-15 ENCOUNTER — Other Ambulatory Visit (HOSPITAL_COMMUNITY): Payer: Self-pay

## 2022-03-15 ENCOUNTER — Inpatient Hospital Stay (HOSPITAL_BASED_OUTPATIENT_CLINIC_OR_DEPARTMENT_OTHER): Payer: Medicare HMO | Admitting: Hematology & Oncology

## 2022-03-15 ENCOUNTER — Telehealth: Payer: Self-pay

## 2022-03-15 ENCOUNTER — Inpatient Hospital Stay: Payer: Medicare HMO | Attending: Hematology & Oncology

## 2022-03-15 ENCOUNTER — Other Ambulatory Visit: Payer: Self-pay

## 2022-03-15 ENCOUNTER — Encounter: Payer: Self-pay | Admitting: Hematology & Oncology

## 2022-03-15 VITALS — BP 131/76 | HR 67 | Temp 97.9°F | Resp 16 | Wt 165.0 lb

## 2022-03-15 DIAGNOSIS — D500 Iron deficiency anemia secondary to blood loss (chronic): Secondary | ICD-10-CM | POA: Diagnosis not present

## 2022-03-15 DIAGNOSIS — C8800 Waldenstrom macroglobulinemia not having achieved remission: Secondary | ICD-10-CM

## 2022-03-15 DIAGNOSIS — D509 Iron deficiency anemia, unspecified: Secondary | ICD-10-CM | POA: Insufficient documentation

## 2022-03-15 DIAGNOSIS — Z79899 Other long term (current) drug therapy: Secondary | ICD-10-CM | POA: Insufficient documentation

## 2022-03-15 LAB — CBC WITH DIFFERENTIAL (CANCER CENTER ONLY)
Abs Immature Granulocytes: 0.01 10*3/uL (ref 0.00–0.07)
Basophils Absolute: 0 10*3/uL (ref 0.0–0.1)
Basophils Relative: 1 %
Eosinophils Absolute: 0.1 10*3/uL (ref 0.0–0.5)
Eosinophils Relative: 2 %
HCT: 37.9 % — ABNORMAL LOW (ref 39.0–52.0)
Hemoglobin: 12.8 g/dL — ABNORMAL LOW (ref 13.0–17.0)
Immature Granulocytes: 0 %
Lymphocytes Relative: 21 %
Lymphs Abs: 1 10*3/uL (ref 0.7–4.0)
MCH: 32.6 pg (ref 26.0–34.0)
MCHC: 33.8 g/dL (ref 30.0–36.0)
MCV: 96.4 fL (ref 80.0–100.0)
Monocytes Absolute: 0.6 10*3/uL (ref 0.1–1.0)
Monocytes Relative: 13 %
Neutro Abs: 2.9 10*3/uL (ref 1.7–7.7)
Neutrophils Relative %: 63 %
Platelet Count: 218 10*3/uL (ref 150–400)
RBC: 3.93 MIL/uL — ABNORMAL LOW (ref 4.22–5.81)
RDW: 13.2 % (ref 11.5–15.5)
WBC Count: 4.6 10*3/uL (ref 4.0–10.5)
nRBC: 0 % (ref 0.0–0.2)

## 2022-03-15 LAB — CMP (CANCER CENTER ONLY)
ALT: 27 U/L (ref 0–44)
AST: 36 U/L (ref 15–41)
Albumin: 4.3 g/dL (ref 3.5–5.0)
Alkaline Phosphatase: 67 U/L (ref 38–126)
Anion gap: 6 (ref 5–15)
BUN: 16 mg/dL (ref 8–23)
CO2: 28 mmol/L (ref 22–32)
Calcium: 9.3 mg/dL (ref 8.9–10.3)
Chloride: 106 mmol/L (ref 98–111)
Creatinine: 1.17 mg/dL (ref 0.61–1.24)
GFR, Estimated: >60 mL/min (ref >60)
Glucose, Bld: 95 mg/dL (ref 70–99)
Potassium: 4.2 mmol/L (ref 3.5–5.1)
Sodium: 140 mmol/L (ref 135–145)
Total Bilirubin: 0.7 mg/dL (ref 0.3–1.2)
Total Protein: 6.6 g/dL (ref 6.5–8.1)

## 2022-03-15 LAB — IRON AND IRON BINDING CAPACITY (CC-WL,HP ONLY)
Iron: 71 ug/dL (ref 45–182)
Saturation Ratios: 19 % (ref 17.9–39.5)
TIBC: 375 ug/dL (ref 250–450)
UIBC: 304 ug/dL (ref 117–376)

## 2022-03-15 LAB — LACTATE DEHYDROGENASE: LDH: 188 U/L (ref 98–192)

## 2022-03-15 LAB — FERRITIN: Ferritin: 30 ng/mL (ref 24–336)

## 2022-03-15 NOTE — Telephone Encounter (Signed)
-----   Message from [name], MD sent at 03/15/2022  1:12 PM EDT ----- ?Call and let him know that the iron levels are okay.  Thanks.  Pete ?

## 2022-03-15 NOTE — Progress Notes (Signed)
?Hematology and Oncology Follow Up Visit ? ?Edwin Webster ?[id number] ?1961/05/27 61 y.o. ?03/15/2022 ? ? ?Principle Diagnosis:  ?Waldenstrom's macroglobulinemia ?Iron deficiency anemia ? ?Current Therapy:   ?Rituxan/bendamustine-s/p cycle 2 -- d/c on 06/11/2018 ?Imbruvica  280 mg po q day -- changed on 08/06/2019 ?IV iron as indicated - last received in March 2022  ? ?  ?Interim History:  Edwin Webster is here today for follow-up.  He comes in with his wife.  He has been doing quite well.  We last saw him back on February.  Since then, he has had no problems. ? ?When last checked his monoclonal studies the monoclonal spike was 0.4 g/dL.  His IgM level was 386 mg/dL.  The Kappa light chain was 2.7 mg/dL. ? ?His weight seems to be doing a bit better.  He has had no problems with nausea or vomiting.  He has had no cough.  He has had no problems with COVID.  He has been outside quite a bit. ? ?There is no change in bowel or bladder habits. ? ?Overall, I would say his performance status is ECOG 0.   ? ?Medications:  ?Allergies as of 03/15/2022   ? ?   Reactions  ? Adalimumab Other (See Comments)  ? "blacked out", confused  ? ?  ? ?  ?Medication List  ?  ? ?  ? Accurate as of March 15, 2022 10:56 AM. If you have any questions, ask your nurse or doctor.  ?  ?  ? ?  ? ?alendronate 70 MG tablet ?Commonly known as: Fosamax ?Take 1 tablet (70 mg total) by mouth every 7 (seven) days. Take with a full glass of water on an empty stomach and avoid reclining or lying down for 2 hours after taking this medication. ?  ?aspirin EC 81 MG tablet ?Take 81 mg daily by mouth. ?  ?busPIRone 7.5 MG tablet ?Commonly known as: BUSPAR ?TAKE 1 TABLET 3 X /DAY FOR CHRONIC ANXIETY ?  ?escitalopram 20 MG tablet ?Commonly known as: LEXAPRO ?TAKE 1 TABLET BY MOUTH DAILY FOR MOOD ?  ?ezetimibe 10 MG tablet ?Commonly known as: ZETIA ?TAKE 1 TABLET BY MOUTH EVERY DAY ?  ?famciclovir 500 MG tablet ?Commonly known as: FAMVIR ?TAKE 1 TABLET BY MOUTH EVERY DAY ?   ?famotidine 40 MG tablet ?Commonly known as: PEPCID ?Take 1 tabet Daily for Heartburn and Indigestion ?  ?folic acid 1 MG tablet ?Commonly known as: FOLVITE ?TAKE 1 TABLET DAILY FOR FOLATE DEFICIENCY ?  ?gabapentin 300 MG capsule ?Commonly known as: NEURONTIN ?Take 300 mg by mouth 3 (three) times daily. ?  ?Imbruvica 280 MG tablet ?Generic drug: ibrutinib ?TAKE 1 TABLET (280 MG) BY MOUTH DAILY. ?  ?ipratropium 0.06 % nasal spray ?Commonly known as: ATROVENT ?Use 1 to 2 sprays each nostril 2 to 3 x /day as needed ?  ?levocetirizine 5 MG tablet ?Commonly known as: XYZAL ?Take  1 tablet  Daily  for Allergies ?  ?naproxen 250 MG tablet ?Commonly known as: NAPROSYN ?Take 250 mg by mouth as needed. ?  ?olmesartan 40 MG tablet ?Commonly known as: BENICAR ?Take 1 tablet (40 mg total) by mouth at bedtime. For BP ?  ?rosuvastatin 40 MG tablet ?Commonly known as: CRESTOR ?TAKE 1 TABLET BY MOUTH EVERY DAY FOR CHOLESTEROL ?  ?triamcinolone cream 0.1 % ?Commonly known as: KENALOG ?Apply 1 application topically 2 (two) times daily. ?  ?vitamin B-12 500 MCG tablet ?Commonly known as: CYANOCOBALAMIN ?Take 500 mcg by mouth  daily. ?  ?Vitamin D-3 125 MCG (5000 UT) Tabs ?Take 5,000 Units by mouth 2 (two) times daily. ?  ? ?  ? ? ?Allergies:  ?Allergies  ?Allergen Reactions  ? Adalimumab Other (See Comments)  ?  "blacked out", confused ?  ? ? ?Past Medical History, Surgical history, Social history, and Family History were reviewed and updated. ? ?Review of Systems: ?Review of Systems  ?Constitutional: Negative.   ?HENT: Negative.    ?Eyes: Negative.   ?Respiratory: Negative.    ?Cardiovascular: Negative.   ?Gastrointestinal: Negative.   ?Genitourinary: Negative.   ?Musculoskeletal: Negative.   ?Skin: Negative.   ?Neurological: Negative.   ?Endo/Heme/Allergies: Negative.   ?Psychiatric/Behavioral: Negative.    ?  ? ?Physical Exam: ? weight is 165 lb (74.8 kg). His oral temperature is 97.9 ?F (36.6 ?C). His blood pressure is 131/76 and  his pulse is 67. His respiration is 16 and oxygen saturation is 100%.  ? ?Wt Readings from Last 3 Encounters:  ?03/15/22 165 lb (74.8 kg)  ?01/11/22 168 lb 12.8 oz (76.6 kg)  ?12/06/21 169 lb (76.7 kg)  ?Well then ? ?Physical Exam ?Vitals reviewed.  ?HENT:  ?   Head: Normocephalic and atraumatic.  ?Eyes:  ?   Pupils: Pupils are equal, round, and reactive to light.  ?Cardiovascular:  ?   Rate and Rhythm: Normal rate and regular rhythm.  ?   Heart sounds: Normal heart sounds.  ?Pulmonary:  ?   Effort: Pulmonary effort is normal.  ?   Breath sounds: Normal breath sounds.  ?Abdominal:  ?   General: Bowel sounds are normal.  ?   Palpations: Abdomen is soft.  ?Musculoskeletal:     ?   General: No tenderness or deformity. Normal range of motion.  ?   Cervical back: Normal range of motion.  ?Lymphadenopathy:  ?   Cervical: No cervical adenopathy.  ?Skin: ?   General: Skin is warm and dry.  ?   Findings: No erythema or rash.  ?Neurological:  ?   Mental Status: He is alert and oriented to person, place, and time.  ?Psychiatric:     ?   Behavior: Behavior normal.     ?   Thought Content: Thought content normal.     ?   Judgment: Judgment normal.  ? ? ? ?Lab Results  ?Component Value Date  ? WBC 4.6 03/15/2022  ? HGB 12.8 (L) 03/15/2022  ? HCT 37.9 (L) 03/15/2022  ? MCV 96.4 03/15/2022  ? PLT 218 03/15/2022  ? ?Lab Results  ?Component Value Date  ? FERRITIN 58 01/11/2022  ? IRON 107 01/11/2022  ? TIBC 374 01/11/2022  ? UIBC 267 01/11/2022  ? IRONPCTSAT 29 01/11/2022  ? ?Lab Results  ?Component Value Date  ? RETICCTPCT 1.2 01/11/2022  ? RBC 3.93 (L) 03/15/2022  ? RETICCTABS 33,120 02/12/2018  ? ?Lab Results  ?Component Value Date  ? KPAFRELGTCHN 26.9 (H) 01/11/2022  ? LAMBDASER 11.9 01/11/2022  ? KAPLAMBRATIO 2.26 (H) 01/11/2022  ? ?Lab Results  ?Component Value Date  ? IGGSERUM 566 (L) 01/11/2022  ? IGA 42 (L) 01/11/2022  ? IGMSERUM 386 (H) 01/11/2022  ? ?Lab Results  ?Component Value Date  ? TOTALPROTELP 6.7 01/11/2022  ?  ALBUMINELP 4.1 01/11/2022  ? A1GS 0.2 01/11/2022  ? A2GS 0.7 01/11/2022  ? BETS 0.9 01/11/2022  ? BETA2SER 0.2 02/18/2018  ? GAMS 0.8 01/11/2022  ? MSPIKE 0.4 (H) 01/11/2022  ? SPEI Comment 01/11/2022  ? ?  Chemistry   ?   ?  Component Value Date/Time  ? NA 140 03/15/2022 0911  ? NA 141 08/04/2020 1203  ? K 4.2 03/15/2022 0911  ? CL 106 03/15/2022 0911  ? CO2 28 03/15/2022 0911  ? BUN 16 03/15/2022 0911  ? BUN 13 08/04/2020 1203  ? CREATININE 1.17 03/15/2022 0911  ? CREATININE 1.15 12/06/2021 1219  ?    ?Component Value Date/Time  ? CALCIUM 9.3 03/15/2022 0911  ? ALKPHOS 67 03/15/2022 0911  ? AST 36 03/15/2022 0911  ? ALT 27 03/15/2022 0911  ? BILITOT 0.7 03/15/2022 0911  ?  ? ? ?Impression and Plan: Mr. Dolinsky is a very pleasant 61yo caucasian gentleman with Waldenstrom's macroglobulinemia.  He is on single agent Imbruvica.  He is doing well on this from my point of view. ? ?Also forgot to mention that he does have iron deficiency.  Over last saw him, his ferritin was 58 with an iron saturation of 29%. ? ?We will have to monitor his monoclonal studies.  I do not see any that we had him change the Imbruvica at all right now. ? ?We will plan to get him back in 3 months now.  I think this would be reasonable. ? ?   ?[name], MD ?4/20/202310:56 AM  ? ? ? ?

## 2022-03-16 ENCOUNTER — Encounter: Payer: Self-pay | Admitting: Family

## 2022-03-16 LAB — KAPPA/LAMBDA LIGHT CHAINS
Kappa free light chain: 25.7 mg/L — ABNORMAL HIGH (ref 3.3–19.4)
Kappa, lambda light chain ratio: 2.2 — ABNORMAL HIGH (ref 0.26–1.65)
Lambda free light chains: 11.7 mg/L (ref 5.7–26.3)

## 2022-03-16 LAB — IGG, IGA, IGM
IgA: 47 mg/dL — ABNORMAL LOW (ref 61–437)
IgG (Immunoglobin G), Serum: 556 mg/dL — ABNORMAL LOW (ref 603–1613)
IgM (Immunoglobulin M), Srm: 382 mg/dL — ABNORMAL HIGH (ref 20–172)

## 2022-03-16 NOTE — Progress Notes (Signed)
3 MONTH FOLLOW UP ?Assessment:  ? ? ?Diagnoses and all orders for this visit: ? ?CAD ?Cardiology following  ?Control blood pressure, cholesterol, glucose, increase exercise.  ?Denies angina ?Continue lipid management for miminum LDL <70, ASA ? ?Primary hypertension ?Continue medication ?Monitor blood pressure at home; call if consistently over 130/80 ?Continue DASH diet.   ?Reminder to go to the ER if any CP, SOB, nausea, dizziness, severe HA, changes vision/speech, left arm numbness and tingling and jaw pain. ?-     CBC with Differential/Platelet ?-     COMPLETE METABOLIC PANEL WITH GFR ?-     Magnesium ? ?Sick sinus syndrome ?S/p pacemaker, cardiology follows ? ?Seronegative rheumatoid arthritis ([place]) ?Off of methotrexate; using NSAID prn; discussed voltaren ? ?Waldenstrom's macroglobulinemia ([place]) ?Oncology following; recently well controlled ? ?Major depressive disorder, recurrent episode, mild with anxious distress ([place]) ?Continue medications  ?Lifestyle discussed: diet/exerise, sleep hygiene, stress management, hydration ? ?Hyperlipidemia ?Continue medications ?Continue low cholesterol diet and exercise.  ?Check lipid panel.  ?-     Lipid panel ?-     TSH ? ?Vitamin D deficiency ?At goal at recent check; continue to recommend supplementation for goal of 60-100 ?Defer vitamin D level ? ?Medication management ?-     CBC with Differential/Platelet ?-     COMPLETE METABOLIC PANEL WITH GFR ?-     Magnesium ? ?Abnormal glucose ?Recent A1Cs at goal ?Discussed diet/exercise, weight management  ?Defer A1C; check CMP ?-     COMPLETE METABOLIC PANEL WITH GFR ? ?BMI 24.0-24.9, adult  ?Continue to recommend diet heavy in fruits and veggies and low in animal meats, cheeses, and dairy products, appropriate calorie intake ?Discuss exercise recommendations routinely ?Continue to monitor weight at each visit ? ?Iron deficiency anemia due to chronic blood loss ?Supplement iron as needed; hematology is following ?-     CBC with  Differential/Platelet ? ?Compression fracture, T12 (HCC)  ?Was not recommended kyphoplasty ?Limited benefit with tylenol and gabapentin ?Rare tramadol does help, discussed pain management  ?Will try gabapentin 600 mg TID for benefit ?Consider switch from lexapro to cymbalta -  ?[place] PT referral for core stability, he will consider and get back to me ? ?Osteoporosis ?Repeat DEXA 2 years, continue Vit D and Ca, weight bearing exercises ?On alendronate and tolerating well  ? ? ?Orders Placed This Encounter  ?Procedures  ? Magnesium  ? Lipid panel  ? TSH  ? ? ?Over 30 minutes of exam, counseling, chart review, and critical decision making was performed ? ?Future Appointments  ?Date Time Provider [place]  ?05/10/2022  7:10 AM [facility] DEVICE REMOTES [facility]  ?05/31/2022  9:45 AM [facility] LAB [facility] None  ?05/31/2022 10:00 AM Ennever, Rudell Cobb, MD [facility] None  ?08/09/2022  7:10 AM [facility] DEVICE REMOTES [facility]  ?09/26/2022  2:00 PM Unk Pinto, MD [facility] None  ?11/08/2022  7:10 AM [facility] DEVICE REMOTES [facility]  ?12/06/2022 11:00 AM Liane Comber, NP [facility] None  ?02/07/2023  7:10 AM [facility] DEVICE REMOTES [facility]  ?05/09/2023  7:10 AM [facility] DEVICE REMOTES [facility]  ? ? ?Subjective:  ?Edwin Webster is a 61 y.o. male who presents for 3 month follow up for HTN, hyperlipidemia, glucose management, and vitamin D Def.  ? 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  ? ?Pateint has as sero-negative RA by U/S with negative RA factor & Anti-CCP and is followed  by Dr Lenna Gilford. Off of methotrexate, takes NSAID PRN, mostly in hands, and doing well.  ? ?He was diagnosed with Waldenstrom's macroglobulinemia 2019 after persistent unexplained fatigue; he now follows with Dr. Marin Olp, Rituxan/bendamustine-s/p cycle 2, Imbruvica 420 mg po q  day and reports significant improvement with this, ECOG of 1. He is retired. He has iron deficiency anemia that is also monitored, IV iron as indicated.  ? ?Patient is on Alendronate for a thoracic Compression fx (T12, L1) due to Osteoporosis. He did see ortho Dr. Lorin Mercy but was released to PRN follow up, was advised not candidate for kyphoplasty and was released. Last DEXA 05/2021 with T -2.9. He has been using tylenol and gabapentin 300 mg TID with limited benefit. Hasn't tried higher dose gabapentin. He has old tramadol script.  ? ?he has a diagnosis of depression/anxiety and is currently on lexapro 20 mg daily, buspar 5 mg TID PRN, takes BID per patient, been able to taper off of xanax.  ? ?BMI is Body mass index is 24.81 kg/m?., he has been working on diet and exercise. Walking 30+ min on his property  ?Wt Readings from Last 3 Encounters:  ?03/20/22 168 lb (76.2 kg)  ?03/15/22 165 lb (74.8 kg)  ?01/11/22 168 lb 12.8 oz (76.6 kg)  ? ?On Sept 2021 Coronary CTA found NonIschemic CAD and also in Sept 2021 had a PPM inserted for symptomatic Bradycardia/sick sinus syndrome( Dr Curt Bears - Cardiologist ) ? ?His blood pressure has been controlled at home, today their BP is BP: 128/78 ?He does workout. He denies chest pain, shortness of breath, dizziness.  ? ?He is on cholesterol medication (rosuvastatin 40 mg daily and zetia 10 mg daily) and denies myalgias. His cholesterol is at goal. The cholesterol last visit was:   ?Lab Results  ?Component Value Date  ? CHOL 169 12/06/2021  ? HDL 48 12/06/2021  ? LDLCALC 106 (H) 12/06/2021  ? TRIG 61 12/06/2021  ? CHOLHDL 3.5 12/06/2021  ? ?He has been working on diet and exercise for glucose management, and denies nausea, paresthesia of the feet, polydipsia, polyuria, visual disturbances and vomiting. Last A1C in the office was:  ?Lab Results  ?Component Value Date  ? HGBA1C 5.3 09/18/2021  ? ?Last GFR ?Lab Results  ?Component Value Date  ? GFRNONAA >60 03/15/2022  ? ?Patient is on  Vitamin D supplement, taking 10000 IU dailydaily only ?Lab Results  ?Component Value Date  ? VD25OH 65 09/18/2021  ?   ? ?  Latest Ref Rng & Units 03/15/2022  ?  9:11 AM 01/11/2022  ? 10:02 AM 12/06/2021  ? 12:19 PM  ?CBC  ?WBC 4.0 - 10.5 K/uL 4.6   3.7   5.1    ?Hemoglobin 13.0 - 17.0 g/dL 12.8   12.4   13.6    ?Hematocrit 39.0 - 52.0 % 37.9   36.7   39.8    ?Platelets 150 - 400 K/uL 218   202   233    ? ?Lab Results  ?Component Value Date  ? IRON 71 03/15/2022  ? TIBC 375 03/15/2022  ? FERRITIN 30 03/15/2022  ? ? ? ?Medication Review: ? ?Current Outpatient Medications (Endocrine & Metabolic):  ?  alendronate (FOSAMAX) 70 MG tablet, Take 1 tablet (70 mg total) by mouth every 7 (seven) days. Take with a full glass of water on an empty stomach and avoid reclining or lying down for 2 hours after taking this medication. ? ?Current Outpatient Medications (Cardiovascular):  ?  ezetimibe (  ZETIA) 10 MG tablet, TAKE 1 TABLET BY MOUTH EVERY DAY ?  olmesartan (BENICAR) 40 MG tablet, Take 1 tablet (40 mg total) by mouth at bedtime. For BP ?  rosuvastatin (CRESTOR) 40 MG tablet, TAKE 1 TABLET BY MOUTH EVERY DAY FOR CHOLESTEROL ? ?Current Outpatient Medications (Respiratory):  ?  ipratropium (ATROVENT) 0.06 % nasal spray, Use 1 to 2 sprays each nostril 2 to 3 x /day as needed ?  levocetirizine (XYZAL) 5 MG tablet, Take  1 tablet  Daily  for Allergies ? ?Current Outpatient Medications (Analgesics):  ?  aspirin EC 81 MG tablet, Take 81 mg daily by mouth. ?  naproxen (NAPROSYN) 250 MG tablet, Take 250 mg by mouth as needed. ? ?Current Outpatient Medications (Hematological):  ?  folic acid (FOLVITE) 1 MG tablet, TAKE 1 TABLET DAILY FOR FOLATE DEFICIENCY ?  vitamin B-12 (CYANOCOBALAMIN) 500 MCG tablet, Take 500 mcg by mouth daily. ? ?Current Outpatient Medications (Other):  ?  busPIRone (BUSPAR) 7.5 MG tablet, TAKE 1 TABLET 3 X /DAY FOR CHRONIC ANXIETY ?  Cholecalciferol (VITAMIN D-3) 5000 UNITS TABS, Take 5,000 Units by mouth 2 (two)  times daily.  ?  escitalopram (LEXAPRO) 20 MG tablet, TAKE 1 TABLET BY MOUTH DAILY FOR MOOD ?  famciclovir (FAMVIR) 500 MG tablet, TAKE 1 TABLET BY MOUTH EVERY DAY ?  famotidine (PEPCID) 40 MG tablet, Ta

## 2022-03-20 ENCOUNTER — Ambulatory Visit (INDEPENDENT_AMBULATORY_CARE_PROVIDER_SITE_OTHER): Payer: Medicare HMO | Admitting: Adult Health

## 2022-03-20 ENCOUNTER — Other Ambulatory Visit: Payer: Self-pay | Admitting: Adult Health

## 2022-03-20 ENCOUNTER — Encounter: Payer: Self-pay | Admitting: Adult Health

## 2022-03-20 VITALS — BP 128/78 | HR 60 | Temp 97.7°F | Wt 168.0 lb

## 2022-03-20 DIAGNOSIS — I10 Essential (primary) hypertension: Secondary | ICD-10-CM

## 2022-03-20 DIAGNOSIS — M0600 Rheumatoid arthritis without rheumatoid factor, unspecified site: Secondary | ICD-10-CM | POA: Diagnosis not present

## 2022-03-20 DIAGNOSIS — G8929 Other chronic pain: Secondary | ICD-10-CM

## 2022-03-20 DIAGNOSIS — R7309 Other abnormal glucose: Secondary | ICD-10-CM

## 2022-03-20 DIAGNOSIS — I2510 Atherosclerotic heart disease of native coronary artery without angina pectoris: Secondary | ICD-10-CM | POA: Diagnosis not present

## 2022-03-20 DIAGNOSIS — S22080A Wedge compression fracture of T11-T12 vertebra, initial encounter for closed fracture: Secondary | ICD-10-CM

## 2022-03-20 DIAGNOSIS — E782 Mixed hyperlipidemia: Secondary | ICD-10-CM

## 2022-03-20 DIAGNOSIS — F330 Major depressive disorder, recurrent, mild: Secondary | ICD-10-CM

## 2022-03-20 DIAGNOSIS — I495 Sick sinus syndrome: Secondary | ICD-10-CM

## 2022-03-20 DIAGNOSIS — C880 Waldenstrom macroglobulinemia: Secondary | ICD-10-CM

## 2022-03-20 DIAGNOSIS — Z79899 Other long term (current) drug therapy: Secondary | ICD-10-CM

## 2022-03-20 DIAGNOSIS — Z6824 Body mass index (BMI) 24.0-24.9, adult: Secondary | ICD-10-CM

## 2022-03-20 DIAGNOSIS — E559 Vitamin D deficiency, unspecified: Secondary | ICD-10-CM

## 2022-03-20 LAB — PROTEIN ELECTROPHORESIS, SERUM, WITH REFLEX
A/G Ratio: 1.7 (ref 0.7–1.7)
Albumin ELP: 3.8 g/dL (ref 2.9–4.4)
Alpha-1-Globulin: 0.2 g/dL (ref 0.0–0.4)
Alpha-2-Globulin: 0.6 g/dL (ref 0.4–1.0)
Beta Globulin: 0.7 g/dL (ref 0.7–1.3)
Gamma Globulin: 0.7 g/dL (ref 0.4–1.8)
Globulin, Total: 2.3 g/dL (ref 2.2–3.9)
M-Spike, %: 0.3 g/dL — ABNORMAL HIGH
SPEP Interpretation: 0
Total Protein ELP: 6.1 g/dL (ref 6.0–8.5)

## 2022-03-20 LAB — IMMUNOFIXATION REFLEX, SERUM
IgA: 48 mg/dL — ABNORMAL LOW (ref 61–437)
IgG (Immunoglobin G), Serum: 596 mg/dL — ABNORMAL LOW (ref 603–1613)
IgM (Immunoglobulin M), Srm: 365 mg/dL — ABNORMAL HIGH (ref 20–172)

## 2022-03-20 MED ORDER — DULOXETINE HCL 30 MG PO CPEP: ORAL_CAPSULE | ORAL | 0 refills | Status: DC

## 2022-03-20 NOTE — Telephone Encounter (Signed)
Patient is agreeable with trying the Cymbalta. Please send in new prescription. He knows to still take the Lexapro until he starts the Cymbalta.  ?

## 2022-03-21 ENCOUNTER — Encounter: Payer: Self-pay | Admitting: *Deleted

## 2022-03-21 LAB — TSH: TSH: 2.8 mIU/L (ref 0.40–4.50)

## 2022-03-21 LAB — LIPID PANEL
Cholesterol: 129 mg/dL (ref <200)
HDL: 51 mg/dL (ref >=40)
LDL Cholesterol (Calc): 64 mg/dL (calc)
Non-HDL Cholesterol (Calc): 78 mg/dL (calc) (ref <130)
Total CHOL/HDL Ratio: 2.5 (calc) (ref <5.0)
Triglycerides: 55 mg/dL (ref <150)

## 2022-03-21 LAB — MAGNESIUM: Magnesium: 2.2 mg/dL (ref 1.5–2.5)

## 2022-04-05 ENCOUNTER — Other Ambulatory Visit (HOSPITAL_COMMUNITY): Payer: Self-pay

## 2022-04-09 ENCOUNTER — Other Ambulatory Visit (HOSPITAL_COMMUNITY): Payer: Self-pay

## 2022-04-24 ENCOUNTER — Ambulatory Visit (INDEPENDENT_AMBULATORY_CARE_PROVIDER_SITE_OTHER): Payer: Medicare HMO | Admitting: Nurse Practitioner

## 2022-04-24 ENCOUNTER — Encounter: Payer: Self-pay | Admitting: Nurse Practitioner

## 2022-04-24 VITALS — BP 138/68 | HR 62 | Temp 97.5°F | Wt 169.0 lb

## 2022-04-24 DIAGNOSIS — I495 Sick sinus syndrome: Secondary | ICD-10-CM

## 2022-04-24 DIAGNOSIS — C880 Waldenstrom macroglobulinemia: Secondary | ICD-10-CM

## 2022-04-24 DIAGNOSIS — R109 Unspecified abdominal pain: Secondary | ICD-10-CM

## 2022-04-24 DIAGNOSIS — Z87442 Personal history of urinary calculi: Secondary | ICD-10-CM

## 2022-04-24 MED ORDER — ACETAMINOPHEN-CODEINE 300-30 MG PO TABS: 1.0000 | ORAL_TABLET | ORAL | 0 refills | Status: AC | PRN

## 2022-04-24 MED ORDER — TAMSULOSIN HCL 0.4 MG PO CAPS: 0.4000 mg | ORAL_CAPSULE | Freq: Every day | ORAL | 0 refills | Status: DC

## 2022-04-24 NOTE — Patient Instructions (Signed)

## 2022-04-24 NOTE — Progress Notes (Signed)
Assessment and Plan:  Edwin Webster was seen today for acute visit.  Diagnoses and all orders for this visit:  Acute right flank pain Push fluids, will treat further pending imaging results -     CT RENAL STONE STUDY; Future -     tamsulosin (FLOMAX) 0.4 MG CAPS capsule; Take 1 capsule (0.4 mg total) by mouth daily. -     acetaminophen-codeine (TYLENOL #3) 300-30 MG tablet; Take 1-2 tablets by mouth every 4 (four) hours as needed for up to 5 days for moderate pain. -     Urinalysis, Routine w reflex microscopic -     Urine Culture  Personal history of kidney stones -     tamsulosin (FLOMAX) 0.4 MG CAPS capsule; Take 1 capsule (0.4 mg total) by mouth daily.  Waldenstrom's macroglobulinemia ([place]) Continue medication and follow with Dr. Marin Olp  Sick sinus syndrome [facility]) Continue medicartion and follow with Dr Curt Bears      Further disposition pending results of labs. Discussed med's effects and SE's.   Over 30 minutes of exam, counseling, chart review, and critical decision making was performed.   Future Appointments  Date Time Provider [place]  05/01/2022 11:00 AM Liane Comber, NP [facility] None  05/10/2022  7:10 AM [facility] DEVICE REMOTES [facility]  05/31/2022  9:45 AM [facility] None  05/31/2022 10:00 AM Ennever, Rudell Cobb, MD [facility] None  08/09/2022  7:10 AM [facility] DEVICE REMOTES [facility]  09/26/2022  2:00 PM Unk Pinto, MD [facility] None  11/08/2022  7:10 AM [facility] DEVICE REMOTES [facility]  12/06/2022 11:00 AM Liane Comber, NP [facility] None  02/07/2023  7:10 AM [facility] DEVICE REMOTES [facility]  05/09/2023  7:10 AM [facility] DEVICE REMOTES [facility]    ------------------------------------------------------------------------------------------------------------------   HPI BP 138/68   Pulse 62   Temp (!) 97.5 F (36.4 C)   Wt 169 lb (76.7 kg)   SpO2 95%    BMI 24.96 kg/m   61 y.o.male presents for Pain in right lower back x 3 days. Pain is a constant ache and when he takes a deep breath it is a sharp pain. He is urinating more frequently.  Notices relief of pain for short period right after urination but does return.  He has a history of kidney stones, uncertain of when last occurred. Denies hematuria, fever, nausea, vomiting and abdominal pain.  07/2020 [employer] dual-chamber pacemaker implanted for sick sinus syndrome and is doing well.  Last visit with Dr. Curt Bears was 08/31/21 - device functioning appropriately.  He does have Waldenstrom's macroglobulinemia and is followed by Dr. Marin Olp.  Appears to be stable on Imbruvica. Last visit was 03/15/22.  Past Medical History:  Diagnosis Date   Allergy    seasonal allergies   Anemia    Anxiety    on meds   Arthritis    Chest pain, non-cardiac    COPD (chronic obstructive pulmonary disease) (HCC)    past smoker   Counseling regarding goals of care 03/27/2018   Depression    on meds   Diverticulitis    Diverticulosis    Fractured pelvis ([place])    fall from ladder   GERD (gastroesophageal reflux disease)    on meds   Gout    History of shingles    Hyperlipidemia    on meds   Hypertension    on meds   Internal hemorrhoids    Iron deficiency anemia 03/13/2018   Kidney stones    Osteoporosis  Other abnormal glucose 10/11/2015   Rheumatoid arthritis (HCC)    hands   Waldenstrom's macroglobulinemia ([place]) 03/27/2018     Allergies  Allergen Reactions   Adalimumab Other (See Comments)    "blacked out", confused     Current Outpatient Medications on File Prior to Visit  Medication Sig   alendronate (FOSAMAX) 70 MG tablet Take 1 tablet (70 mg total) by mouth every 7 (seven) days. Take with a full glass of water on an empty stomach and avoid reclining or lying down for 2 hours after taking this medication.   aspirin EC 81 MG tablet Take 81 mg daily by mouth.   busPIRone (BUSPAR) 7.5  MG tablet TAKE 1 TABLET 3 X /DAY FOR CHRONIC ANXIETY   Cholecalciferol (VITAMIN D-3) 5000 UNITS TABS Take 5,000 Units by mouth 2 (two) times daily.    DULoxetine (CYMBALTA) 30 MG capsule Take 1 tab daily for mood and chronic pain.   ezetimibe (ZETIA) 10 MG tablet TAKE 1 TABLET BY MOUTH EVERY DAY   famciclovir (FAMVIR) 500 MG tablet TAKE 1 TABLET BY MOUTH EVERY DAY   famotidine (PEPCID) 40 MG tablet Take 1 tabet Daily for Heartburn and Indigestion   folic acid (FOLVITE) 1 MG tablet TAKE 1 TABLET DAILY FOR FOLATE DEFICIENCY   gabapentin (NEURONTIN) 300 MG capsule Take 300-600 mg by mouth 3 (three) times daily.   ibrutinib (IMBRUVICA) 280 MG tablet TAKE 1 TABLET (280 MG) BY MOUTH DAILY.   ipratropium (ATROVENT) 0.06 % nasal spray Use 1 to 2 sprays each nostril 2 to 3 x /day as needed   levocetirizine (XYZAL) 5 MG tablet Take  1 tablet  Daily  for Allergies   naproxen (NAPROSYN) 250 MG tablet Take 250 mg by mouth as needed.   olmesartan (BENICAR) 40 MG tablet Take 1 tablet (40 mg total) by mouth at bedtime. For BP   rosuvastatin (CRESTOR) 40 MG tablet TAKE 1 TABLET BY MOUTH EVERY DAY FOR CHOLESTEROL   triamcinolone cream (KENALOG) 0.1 % Apply 1 application topically 2 (two) times daily.   vitamin B-12 (CYANOCOBALAMIN) 500 MCG tablet Take 500 mcg by mouth daily.   No current facility-administered medications on file prior to visit.    ROS: all negative except above.   Physical Exam:  BP 138/68   Pulse 62   Temp (!) 97.5 F (36.4 C)   Wt 169 lb (76.7 kg)   SpO2 95%   BMI 24.96 kg/m   General Appearance: Well nourished, in no apparent distress. Eyes: PERRLA, EOMs, conjunctiva no swelling or erythema Sinuses: No Frontal/maxillary tenderness ENT/Mouth: Ext aud canals clear, TMs without erythema, bulging. No erythema, swelling, or exudate on post pharynx.  Tonsils not swollen or erythematous. Hearing normal.  Neck: Supple, thyroid normal.  Respiratory: Respiratory effort normal, BS equal  bilaterally without rales, rhonchi, wheezing or stridor.  Cardio: RRR with no MRGs. Brisk peripheral pulses without edema.  Abdomen: Soft, + BS.  Non tender, no guarding, rebound, hernias, masses. Lymphatics: Non tender without lymphadenopathy.  Musculoskeletal: Full ROM, 5/5 strength, normal gait.  Skin: Warm, dry without rashes, lesions, ecchymosis.  Neuro: Cranial nerves intact. Normal muscle tone, no cerebellar symptoms. Sensation intact.  Psych: Awake and oriented X 3, normal affect, Insight and Judgment appropriate.     Magda Bernheim, NP 2:09 PM [facility]

## 2022-04-25 ENCOUNTER — Other Ambulatory Visit: Payer: Self-pay | Admitting: Nurse Practitioner

## 2022-04-25 ENCOUNTER — Ambulatory Visit
Admission: RE | Admit: 2022-04-25 | Discharge: 2022-04-25 | Disposition: A | Discharge status: Home / Self Care | Payer: Medicare HMO | Source: Ambulatory Visit | Attending: Nurse Practitioner | Admitting: Nurse Practitioner

## 2022-04-25 DIAGNOSIS — R109 Unspecified abdominal pain: Secondary | ICD-10-CM

## 2022-04-25 LAB — URINE CULTURE
MICRO NUMBER:: 13460856
Result:: NO GROWTH
SPECIMEN QUALITY:: ADEQUATE

## 2022-04-25 LAB — URINALYSIS, ROUTINE W REFLEX MICROSCOPIC
Bilirubin Urine: NEGATIVE
Glucose, UA: NEGATIVE
Hgb urine dipstick: NEGATIVE
Ketones, ur: NEGATIVE
Leukocytes,Ua: NEGATIVE
Nitrite: NEGATIVE
Protein, ur: NEGATIVE
Specific Gravity, Urine: 1.008 (ref 1.001–1.035)
pH: 7 (ref 5.0–8.0)

## 2022-04-25 MED ORDER — BACLOFEN 10 MG PO TABS: 10.0000 mg | ORAL_TABLET | Freq: Two times a day (BID) | ORAL | 1 refills | Status: DC

## 2022-04-27 ENCOUNTER — Encounter: Payer: Self-pay | Admitting: Adult Health

## 2022-05-01 ENCOUNTER — Ambulatory Visit (INDEPENDENT_AMBULATORY_CARE_PROVIDER_SITE_OTHER): Payer: Medicare HMO | Admitting: Adult Health

## 2022-05-01 ENCOUNTER — Other Ambulatory Visit (HOSPITAL_COMMUNITY): Payer: Self-pay

## 2022-05-01 ENCOUNTER — Encounter: Payer: Self-pay | Admitting: Adult Health

## 2022-05-01 VITALS — BP 136/78 | HR 68 | Temp 97.7°F | Wt 168.0 lb

## 2022-05-01 DIAGNOSIS — F3341 Major depressive disorder, recurrent, in partial remission: Secondary | ICD-10-CM

## 2022-05-01 DIAGNOSIS — M545 Low back pain, unspecified: Secondary | ICD-10-CM | POA: Diagnosis not present

## 2022-05-01 DIAGNOSIS — N200 Calculus of kidney: Secondary | ICD-10-CM | POA: Insufficient documentation

## 2022-05-01 MED ORDER — TIZANIDINE HCL 4 MG PO TABS: 2.0000 mg | ORAL_TABLET | Freq: Three times a day (TID) | ORAL | 0 refills | Status: DC | PRN

## 2022-05-01 MED ORDER — PREDNISONE 20 MG PO TABS: ORAL_TABLET | ORAL | 0 refills | Status: AC

## 2022-05-01 NOTE — Patient Instructions (Signed)
Low Back Sprain or Strain Rehab Ask your health care provider which exercises are safe for you. Do exercises exactly as told by your health care provider and adjust them as directed. It is normal to feel mild stretching, pulling, tightness, or discomfort as you do these exercises. Stop right away if you feel sudden pain or your pain gets worse. Do not begin these exercises until told by your health care provider. Stretching and range-of-motion exercises These exercises warm up your muscles and joints and improve the movement and flexibility of your back. These exercises also help to relieve pain, numbness, and tingling. Lumbar rotation  Lie on your back on a firm bed or the floor with your knees bent. Straighten your arms out to your sides so each arm forms a 90-degree angle (right angle) with a side of your body. Slowly move (rotate) both of your knees to one side of your body until you feel a stretch in your lower back (lumbar). Try not to let your shoulders lift off the floor. Hold this position for __________ seconds. Tense your abdominal muscles and slowly move your knees back to the starting position. Repeat this exercise on the other side of your body. Repeat __________ times. Complete this exercise __________ times a day. Single knee to chest  Lie on your back on a firm bed or the floor with both legs straight. Bend one of your knees. Use your hands to move your knee up toward your chest until you feel a gentle stretch in your lower back and buttock. Hold your leg in this position by holding on to the front of your knee. Keep your other leg as straight as possible. Hold this position for __________ seconds. Slowly return to the starting position. Repeat with your other leg. Repeat __________ times. Complete this exercise __________ times a day. Prone extension on elbows  Lie on your abdomen on a firm bed or the floor (prone position). Prop yourself up on your elbows. Use your arms  to help lift your chest up until you feel a gentle stretch in your abdomen and your lower back. This will place some of your body weight on your elbows. If this is uncomfortable, try stacking pillows under your chest. Your hips should stay down, against the surface that you are lying on. Keep your hip and back muscles relaxed. Hold this position for __________ seconds. Slowly relax your upper body and return to the starting position. Repeat __________ times. Complete this exercise __________ times a day. Strengthening exercises These exercises build strength and endurance in your back. Endurance is the ability to use your muscles for a long time, even after they get tired. Pelvic tilt This exercise strengthens the muscles that lie deep in the abdomen. Lie on your back on a firm bed or the floor with your legs extended. Bend your knees so they are pointing toward the ceiling and your feet are flat on the floor. Tighten your lower abdominal muscles to press your lower back against the floor. This motion will tilt your pelvis so your tailbone points up toward the ceiling instead of pointing to your feet or the floor. To help with this exercise, you may place a small towel under your lower back and try to push your back into the towel. Hold this position for __________ seconds. Let your muscles relax completely before you repeat this exercise. Repeat __________ times. Complete this exercise __________ times a day. Alternating arm and leg raises  Get on your hands   and knees on a firm surface. If you are on a hard floor, you may want to use padding, such as an exercise mat, to cushion your knees. Line up your arms and legs. Your hands should be directly below your shoulders, and your knees should be directly below your hips. Lift your left leg behind you. At the same time, raise your right arm and straighten it in front of you. Do not lift your leg higher than your hip. Do not lift your arm higher  than your shoulder. Keep your abdominal and back muscles tight. Keep your hips facing the ground. Do not arch your back. Keep your balance carefully, and do not hold your breath. Hold this position for __________ seconds. Slowly return to the starting position. Repeat with your right leg and your left arm. Repeat __________ times. Complete this exercise __________ times a day. Abdominal set with straight leg raise  Lie on your back on a firm bed or the floor. Bend one of your knees and keep your other leg straight. Tense your abdominal muscles and lift your straight leg up, 4-6 inches (10-15 cm) off the ground. Keep your abdominal muscles tight and hold this position for __________ seconds. Do not hold your breath. Do not arch your back. Keep it flat against the ground. Keep your abdominal muscles tense as you slowly lower your leg back to the starting position. Repeat with your other leg. Repeat __________ times. Complete this exercise __________ times a day. Single leg lower with bent knees Lie on your back on a firm bed or the floor. Tense your abdominal muscles and lift your feet off the floor, one foot at a time, so your knees and hips are bent in 90-degree angles (right angles). Your knees should be over your hips and your lower legs should be parallel to the floor. Keeping your abdominal muscles tense and your knee bent, slowly lower one of your legs so your toe touches the ground. Lift your leg back up to return to the starting position. Do not hold your breath. Do not let your back arch. Keep your back flat against the ground. Repeat with your other leg. Repeat __________ times. Complete this exercise __________ times a day. Posture and body mechanics Good posture and healthy body mechanics can help to relieve stress in your body's tissues and joints. Body mechanics refers to the movements and positions of your body while you do your daily activities. Posture is part of body  mechanics. Good posture means: Your spine is in its natural S-curve position (neutral). Your shoulders are pulled back slightly. Your head is not tipped forward (neutral). Follow these guidelines to improve your posture and body mechanics in your everyday activities. Standing  When standing, keep your spine neutral and your feet about hip-width apart. Keep a slight bend in your knees. Your ears, shoulders, and hips should line up. When you do a task in which you stand in one place for a long time, place one foot up on a stable object that is 2-4 inches (5-10 cm) high, such as a footstool. This helps keep your spine neutral. Sitting  When sitting, keep your spine neutral and keep your feet flat on the floor. Use a footrest, if necessary, and keep your thighs parallel to the floor. Avoid rounding your shoulders, and avoid tilting your head forward. When working at a desk or a computer, keep your desk at a height where your hands are slightly lower than your elbows. Slide your   chair under your desk so you are close enough to maintain good posture. When working at a computer, place your monitor at a height where you are looking straight ahead and you do not have to tilt your head forward or downward to look at the screen. Resting When lying down and resting, avoid positions that are most painful for you. If you have pain with activities such as sitting, bending, stooping, or squatting, lie in a position in which your body does not bend very much. For example, avoid curling up on your side with your arms and knees near your chest (fetal position). If you have pain with activities such as standing for a long time or reaching with your arms, lie with your spine in a neutral position and bend your knees slightly. Try the following positions: Lying on your side with a pillow between your knees. Lying on your back with a pillow under your knees. Lifting  When lifting objects, keep your feet at least  shoulder-width apart and tighten your abdominal muscles. Bend your knees and hips and keep your spine neutral. It is important to lift using the strength of your legs, not your back. Do not lock your knees straight out. Always ask for help to lift heavy or awkward objects. This information is not intended to replace advice given to you by your health care provider. Make sure you discuss any questions you have with your health care provider. Document Revised: 01/30/2021 Document Reviewed: 01/30/2021 Elsevier Patient Education  [place].

## 2022-05-01 NOTE — Progress Notes (Signed)
Assessment and Plan:  Deandra was seen today for follow-up.  Diagnoses and all orders for this visit:  Acute right-sided low back pain without sciatica Reassured no evidence r/t stone, stop flomax Will try tizanidine and steroid taper for lumbar strain type sx, initially encourage rest, alternate ice/moist heat, evaluate progress with steroid taper x 3-5 days, call/message if not improving and will refer to ortho for possible PT CT showed no notable bony changes, no new fractures,  If improving given back exercises to gradually incorporate -     predniSONE (DELTASONE) 20 MG tablet; 3 tablets daily with food for 3 days, 2 tabs daily for 3 days, 1 tab a day for 5 days. -     tiZANidine (ZANAFLEX) 4 MG tablet; Take 0.5-1 tablets (2-4 mg total) by mouth every 8 (eight) hours as needed for muscle spasms.  Left renal stone Reviewed study with patient, no current sx; hold flomax and can start if suspect new sx r/t passing, push fluids  Major depressive disorder, recurrent episode, in partial remission ([place]) Mood/chronic pain well controlled with switch from lexapro to cymbalta 30 mg Continue current dose Lifestyle discussed: diet/exerise, sleep hygiene, stress management, hydration   Discussed med's effects and SE's.   Over 30 minutes of exam, counseling, chart review, and critical decision making was performed.   Future Appointments  Date Time Provider [place]  05/10/2022  7:10 AM [facility] DEVICE [facility]OTES [facility]  05/31/2022  9:45 AM [facility] LAB [facility] None  05/31/2022 10:00 AM Ennever, Rudell Cobb, MD [facility] None  08/09/2022  7:10 AM [facility] DEVICE [facility]OTES [facility]  09/26/2022  2:00 PM Unk Pinto, MD [facility] None  11/08/2022  7:10 AM [facility] DEVICE REMOTES [facility]  12/06/2022 11:00 AM Liane Comber, NP [facility] None  02/07/2023  7:10 AM [facility] DEVICE REMOTES [facility]  05/09/2023  7:10 AM  [facility]ICE [facility]OT[facility]    ------------------------------------------------------------------------------------------------------------------   HPI BP 136/78   Pulse 68   Temp 97.7 F (36.5 C)   Wt 168 lb (76.2 kg)   SpO2 98%   BMI 24.81 kg/m  61 y.o.male presents for 6 week follow up on mood/chronic pain after med change. Also c/o persistent R flank/mid/lumbar back pain seen by Hinton Dyer 04/25/2022.   He reports acute back sx began around 04/21/2022 while [place] in [place] at a wedding, abrupt onset of R mid/lower back pain, denies injury or atypical activity prior to onset other than air travel, due to hx of renal stones UA/CT renal study was pursued, R side showed no renal stone, had small 3 mm non-obstructing left renal stone. 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 Lorin Mercy, not a candicate for kyphoplasty and released to PRN follow up). He was prescribed tylenol #3, baclofen 10 mg BID PRN, reports "very woozy" but not helping with pain, limiting activity. Denies abd pain, radicular sx.   He reports has noted significant improvement with switch from lexapro 20 mg to cymbalta 30 mg for mood and chronic midline back pain, prior to recent acute flare pain was well controlled "unless I overdid it." He prefers to continue with current dose.   Past Medical History:  Diagnosis Date   Allergy    seasonal allergies   Anemia    Anxiety    on meds   Arthritis    Chest pain, non-cardiac    COPD (chronic obstructive pulmonary disease) ([place])    past smoker   Counseling  regarding goals of care 03/27/2018   Depression    on meds   Diverticulitis    Diverticulosis    Fractured pelvis ([place])    fall from ladder   GERD (gastroesophageal reflux disease)    on meds   Gout    History of shingles    Hyperlipidemia    on meds   Hypertension    on meds   Internal hemorrhoids    Iron deficiency anemia  03/13/2018   Kidney stones    Osteoporosis    Other abnormal glucose 10/11/2015   Rheumatoid arthritis ([facility])    hands   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 Take with a full glass of water on an empty stomach and avoid reclining or lying down for 2 hours after taking this medication.   aspirin EC 81 MG tablet Take 81 mg daily by mouth.   busPIRone (BUSPAR) 7.5 MG tablet TAKE 1 TABLET 3 X /DAY FOR CHRONIC ANXIETY   Cholecalciferol (VITAMIN D-3) 5000 UNITS TABS Take 5,000 Units by mouth 2 (two) times daily.    DULoxetine (CYMBALTA) 30 MG capsule Take 1 tab daily for mood and chronic pain.   ezetimibe (ZETIA) 10 MG tablet TAKE 1 TABLET BY MOUTH EVERY DAY   famciclovir (FAMVIR) 500 MG tablet TAKE 1 TABLET BY MOUTH EVERY DAY   famotidine (PEPCID) 40 MG tablet Take 1 tabet Daily for Heartburn and Indigestion   folic acid (FOLVITE) 1 MG tablet TAKE 1 TABLET DAILY FOR FOLATE DEFICIENCY   ibrutinib (IMBRUVICA) 280 MG tablet TAKE 1 TABLET (280 MG) BY MOUTH DAILY.   ipratropium (ATROVENT) 0.06 % nasal spray Use 1 to 2 sprays each nostril 2 to 3 x /day as needed   levocetirizine (XYZAL) 5 MG tablet Take  1 tablet  Daily  for Allergies   olmesartan (BENICAR) 40 MG tablet Take 1 tablet (40 mg total) by mouth at bedtime. For BP   rosuvastatin (CRESTOR) 40 MG tablet TAKE 1 TABLET BY MOUTH EVERY DAY FOR CHOLESTEROL   triamcinolone cream (KENALOG) 0.1 % Apply 1 application topically 2 (two) times daily.   vitamin B-12 (CYANOCOBALAMIN) 500 MCG tablet Take 500 mcg by mouth daily.   No current facility-administered medications on file prior to visit.    ROS: all negative except above.   Physical Exam:  BP 136/78   Pulse 68    Temp 97.7 F (36.5 C)   Wt 168 lb (76.2 kg)   SpO2 98%   BMI 24.81 kg/m   General Appearance: Well nourished, in no apparent distress. Eyes: PERRL, conjunctiva no swelling or erythema ENT/Mouth: Hearing normal.  Neck: Supple Respiratory: Respiratory effort normal, BS equal bilaterally without rales, rhonchi, wheezing or stridor.  Cardio: RRR with no MRGs. Brisk peripheral pulses without edema.  Abdomen: Soft, + BS.  Non tender, no guarding, rebound, hernias, masses. NO CVA tenderness.  Lymphatics: Non tender without lymphadenopathy.  Musculoskeletal: Full ROM, slow steady gait; no midline pain, + R lumbar paraspinal area tenderness without spasm. Neg straight leg raise.  Skin: Warm, dry without rashes, lesions, ecchymosis.  Neuro: Normal muscle tone, distal reflexes, Sensation intact.  Psych: Awake and oriented X 3, normal affect, Insight and  Judgment appropriate.    [name], NP 12:15 PM [facility]

## 2022-05-07 ENCOUNTER — Other Ambulatory Visit (HOSPITAL_COMMUNITY): Payer: Self-pay

## 2022-05-10 ENCOUNTER — Ambulatory Visit (INDEPENDENT_AMBULATORY_CARE_PROVIDER_SITE_OTHER): Payer: Medicare HMO

## 2022-05-10 DIAGNOSIS — I495 Sick sinus syndrome: Secondary | ICD-10-CM | POA: Diagnosis not present

## 2022-05-10 LAB — CUP PACEART REMOTE DEVICE CHECK
Battery Remaining Longevity: 109 mo
Battery Remaining Percentage: 88 %
Battery Voltage: 3.01 V
Brady Statistic AP VP Percent: 1 %
Brady Statistic AP VS Percent: 73 %
Brady Statistic AS VP Percent: 1 %
Brady Statistic AS VS Percent: 26 %
Brady Statistic RA Percent Paced: 73 %
Brady Statistic RV Percent Paced: 1 %
Date Time Interrogation Session: 20230615020017
Implantable Lead Implant Date: 20210915
Implantable Lead Implant Date: 20210915
Implantable Lead Location: 753859
Implantable Lead Location: 753860
Implantable Pulse Generator Implant Date: 20210915
Lead Channel Impedance Value: 440 Ohm
Lead Channel Impedance Value: 550 Ohm
Lead Channel Pacing Threshold Amplitude: 0.5 V
Lead Channel Pacing Threshold Amplitude: 0.75 V
Lead Channel Pacing Threshold Pulse Width: 0.5 ms
Lead Channel Pacing Threshold Pulse Width: 0.5 ms
Lead Channel Sensing Intrinsic Amplitude: 12 mV
Lead Channel Sensing Intrinsic Amplitude: 5 mV
Lead Channel Setting Pacing Amplitude: 1 V
Lead Channel Setting Pacing Amplitude: 1.5 V
Lead Channel Setting Pacing Pulse Width: 0.5 ms
Lead Channel Setting Sensing Sensitivity: 2 mV
Pulse Gen Model: 2272
Pulse Gen Serial Number: 3864545

## 2022-05-17 ENCOUNTER — Other Ambulatory Visit: Payer: Self-pay | Admitting: Nurse Practitioner

## 2022-05-17 DIAGNOSIS — Z87442 Personal history of urinary calculi: Secondary | ICD-10-CM

## 2022-05-17 DIAGNOSIS — R109 Unspecified abdominal pain: Secondary | ICD-10-CM

## 2022-05-18 ENCOUNTER — Other Ambulatory Visit: Payer: Self-pay | Admitting: Nurse Practitioner

## 2022-05-18 DIAGNOSIS — R109 Unspecified abdominal pain: Secondary | ICD-10-CM

## 2022-05-22 ENCOUNTER — Ambulatory Visit (INDEPENDENT_AMBULATORY_CARE_PROVIDER_SITE_OTHER): Payer: Medicare HMO | Admitting: Nurse Practitioner

## 2022-05-22 ENCOUNTER — Encounter: Payer: Self-pay | Admitting: Nurse Practitioner

## 2022-05-22 VITALS — BP 128/67 | HR 78 | Temp 97.7°F | Wt 161.8 lb

## 2022-05-22 DIAGNOSIS — J0100 Acute maxillary sinusitis, unspecified: Secondary | ICD-10-CM | POA: Diagnosis not present

## 2022-05-22 DIAGNOSIS — J0140 Acute pansinusitis, unspecified: Secondary | ICD-10-CM | POA: Diagnosis not present

## 2022-05-22 DIAGNOSIS — R051 Acute cough: Secondary | ICD-10-CM

## 2022-05-22 DIAGNOSIS — R0981 Nasal congestion: Secondary | ICD-10-CM

## 2022-05-22 MED ORDER — AZITHROMYCIN 250 MG PO TABS: ORAL_TABLET | ORAL | 1 refills | Status: DC

## 2022-05-22 MED ORDER — PROMETHAZINE-DM 6.25-15 MG/5ML PO SYRP: ORAL_SOLUTION | ORAL | 1 refills | Status: DC

## 2022-05-22 NOTE — Progress Notes (Signed)
Assessment and Plan:  Edwin Webster was seen today for an episodic visit.  Diagnoses and all order for this visit:  1. Acute pansinusitis, recurrence not specified Stay well hydrated to keep mucus thin and productive. Rest.  - promethazine-dextromethorphan (PROMETHAZINE-DM) 6.25-15 MG/5ML syrup; Take 5-10 mL PO q8hrs prn for sinus symptoms  Dispense: 360 mL; Refill: 1 - azithromycin (ZITHROMAX) 250 MG tablet; Take 2 tablets (500 mg) on Day 1 followed by 1 tablet  (250 mg) daily until complete.  Dispense: 6 each; Refill: 1  2. Acute non-recurrent maxillary sinusitis Continue Mucinex PRN. Stay well hydrated to keep mucus thin and productive.  3. Acute cough  - promethazine-dextromethorphan (PROMETHAZINE-DM) 6.25-15 MG/5ML syrup; Take 5-10 mL PO q8hrs prn for sinus symptoms  Dispense: 360 mL; Refill: 1 - azithromycin (ZITHROMAX) 250 MG tablet; Take 2 tablets (500 mg) on Day 1 followed by 1 tablet  (250 mg) daily until complete.  Dispense: 6 each; Refill: 1  4. Nasal congestion Continue Mucinex PRN. Stay well hydrated to keep mucus thin and productive.   Continue to monitor for any increase in fever, chills, N/V. Notify office for further evaluation and treatment, questions or concerns if s/s fail to improve. The risks and benefits of my recommendations, as well as other treatment options were discussed with the patient today. Questions were answered.  Further disposition pending results of labs. Discussed med's effects and SE's.    Over 15 minutes of exam, counseling, chart review, and critical decision making was performed.   Future Appointments  Date Time Provider Department Center  05/31/2022  9:45 AM [facility] None  05/31/2022 10:00 AM Ennever, Rose Phi, MD [facility] None  08/09/2022  7:10 AM [facility] DEVICE REMOT[facility]  09/26/2022  2:00 PM Lucky Cowboy, MD [facility] None  11/08/2022  7:10 AM [facility]   12/06/2022 11:00 AM Adela Glimpse, NP [facility] None  02/07/2023  7:10 AM [facility] DEVICE REMOT[facility]  05/09/2023  7:10 AM [facility]ICE [facility]    ------------------------------------------------------------------------------------------------------------------   HPI BP 128/67   Pulse 78   Temp 97.7 F (36.5 C)   Wt 161 lb 12.8 oz (73.4 kg)   SpO2 97%   BMI 23.89 kg/m    Edwin Webster is a 61 y.o. male who presents for evaluation of symptoms of a URI, possible sinusitis. Symptoms include congestion, facial pain, nasal congestion, and productive cough with  green colored sputum. Onset of symptoms was 2 weeks ago, and has been unchanged since that time. Treatment to date: antihistamines, cough suppressants, and decongestants.   Past Medical History:  Diagnosis Date   Allergy    seasonal allergies   Anemia    Anxiety    on meds   Arthritis    Chest pain, non-cardiac    COPD (chronic obstructive pulmonary disease) ([facility])    past smoker   Counseling regarding goals of care 03/27/2018   Depression    on meds   Diverticulitis    Diverticulosis    Fractured pelvis ([facility])    fall from ladder   GERD (gastroesophageal reflux disease)    on meds   Gout    History of shingles    Hyperlipidemia    on meds   Hypertension    on meds   Internal hemorrhoids    Iron deficiency anemia 03/13/2018   Kidney stones    Osteoporosis    Other abnormal glucose 10/11/2015   Rheumatoid arthritis ([facility])  hands   Waldenstrom's macroglobulinemia ([facility]) 03/27/2018     Allergies  Allergen Reactions   Adalimumab Other (See Comments)    "blacked out", confused     Current Outpatient Medications on File Prior to Visit  Medication Sig   alendronate (FOSAMAX) 70 MG tablet Take 1 tablet (70 mg total) by mouth every 7 (seven) days. Take with a full glass of water on an empty stomach and avoid reclining or lying down for 2 hours after  taking this medication.   aspirin EC 81 MG tablet Take 81 mg daily by mouth.   baclofen (LIORESAL) 10 MG tablet Take 1 /2 to 1 tablet  2 x /day  if needed for Muscle Spasm   busPIRone (BUSPAR) 7.5 MG tablet TAKE 1 TABLET 3 X /DAY FOR CHRONIC ANXIETY   Cholecalciferol (VITAMIN D-3) 5000 UNITS TABS Take 5,000 Units by mouth 2 (two) times daily.    DULoxetine (CYMBALTA) 30 MG capsule Take 1 tab daily for mood and chronic pain.   ezetimibe (ZETIA) 10 MG tablet TAKE 1 TABLET BY MOUTH EVERY DAY   famciclovir (FAMVIR) 500 MG tablet TAKE 1 TABLET BY MOUTH EVERY DAY   famotidine (PEPCID) 40 MG tablet Take 1 tabet Daily for Heartburn and Indigestion   folic acid (FOLVITE) 1 MG tablet TAKE 1 TABLET DAILY FOR FOLATE DEFICIENCY   ibrutinib (IMBRUVICA) 280 MG tablet TAKE 1 TABLET (280 MG) BY MOUTH DAILY.   ipratropium (ATROVENT) 0.06 % nasal spray Use 1 to 2 sprays each nostril 2 to 3 x /day as needed   levocetirizine (XYZAL) 5 MG tablet Take  1 tablet  Daily  for Allergies   olmesartan (BENICAR) 40 MG tablet Take 1 tablet (40 mg total) by mouth at bedtime. For BP   rosuvastatin (CRESTOR) 40 MG tablet TAKE 1 TABLET BY MOUTH EVERY DAY FOR CHOLESTEROL   triamcinolone cream (KENALOG) 0.1 % Apply 1 application topically 2 (two) times daily.   vitamin B-12 (CYANOCOBALAMIN) 500 MCG tablet Take 500 mcg by mouth daily.   No current facility-administered medications on file prior to visit.    ROS: all negative except what is noted in the HPI.    Physical Exam:  BP 128/67   Pulse 78   Temp 97.7 F (36.5 C)   Wt 161 lb 12.8 oz (73.4 kg)   SpO2 97%   BMI 23.89 kg/m   General Appearance: NAD.  Awake, conversant and cooperative. Eyes: PERRLA, EOMs intact.  Sclera white.  Conjunctiva without erythema. Sinuses: Tender frontal/maxillary tenderness.  No nasal discharge. Nares patent.  ENT/Mouth: Ext aud canals clear.  Bilateral TMs w/DOL and without erythema or bulging. Hearing intact.  Posterior pharynx  without swelling or exudate.  Tonsils without swelling or erythema.  Neck: Supple.  No masses, nodules or thyromegaly. Respiratory: Effort is regular with non-labored breathing. Breath sounds are equal bilaterally without rales, rhonchi, wheezing or stridor.  Cardio: RRR with no MRGs. Brisk peripheral pulses without edema.  Abdomen: Active BS in all four quadrants.  Soft and non-tender without guarding, rebound tenderness, hernias or masses. Lymphatics: Non tender without lymphadenopathy.  Musculoskeletal: Full ROM, 5/5 strength, normal ambulation.  No clubbing or cyanosis. Skin: Appropriate color for ethnicity. Warm without rashes, lesions, ecchymosis, ulcers.  Neuro: CN II-XII grossly normal. Normal muscle tone without cerebellar symptoms and intact sensation.   Psych: AO X 3,  appropriate mood and affect, insight and judgment.     Adela Glimpse, NP 9:16 AM [facility]

## 2022-05-26 ENCOUNTER — Other Ambulatory Visit: Payer: Self-pay | Admitting: Adult Health

## 2022-05-26 DIAGNOSIS — M810 Age-related osteoporosis without current pathological fracture: Secondary | ICD-10-CM

## 2022-05-30 ENCOUNTER — Other Ambulatory Visit (HOSPITAL_COMMUNITY): Payer: Self-pay

## 2022-05-31 ENCOUNTER — Other Ambulatory Visit (HOSPITAL_COMMUNITY): Payer: Self-pay

## 2022-05-31 ENCOUNTER — Encounter: Payer: Self-pay | Admitting: Hematology & Oncology

## 2022-05-31 ENCOUNTER — Inpatient Hospital Stay (HOSPITAL_BASED_OUTPATIENT_CLINIC_OR_DEPARTMENT_OTHER): Payer: Medicare HMO | Admitting: Hematology & Oncology

## 2022-05-31 ENCOUNTER — Inpatient Hospital Stay: Payer: Medicare HMO | Attending: Hematology & Oncology

## 2022-05-31 ENCOUNTER — Other Ambulatory Visit: Payer: Self-pay

## 2022-05-31 VITALS — BP 132/73 | HR 65 | Temp 97.7°F | Resp 18 | Wt 155.0 lb

## 2022-05-31 DIAGNOSIS — R634 Abnormal weight loss: Secondary | ICD-10-CM | POA: Insufficient documentation

## 2022-05-31 DIAGNOSIS — C8800 Waldenstrom macroglobulinemia not having achieved remission: Secondary | ICD-10-CM

## 2022-05-31 DIAGNOSIS — D509 Iron deficiency anemia, unspecified: Secondary | ICD-10-CM | POA: Diagnosis not present

## 2022-05-31 DIAGNOSIS — D500 Iron deficiency anemia secondary to blood loss (chronic): Secondary | ICD-10-CM

## 2022-05-31 LAB — IRON AND IRON BINDING CAPACITY (CC-WL,HP ONLY)
Iron: 72 ug/dL (ref 45–182)
Saturation Ratios: 19 % (ref 17.9–39.5)
TIBC: 382 ug/dL (ref 250–450)
UIBC: 310 ug/dL (ref 117–376)

## 2022-05-31 LAB — CBC WITH DIFFERENTIAL (CANCER CENTER ONLY)
Abs Immature Granulocytes: 0.01 10*3/uL (ref 0.00–0.07)
Basophils Absolute: 0 10*3/uL (ref 0.0–0.1)
Basophils Relative: 0 %
Eosinophils Absolute: 0.1 10*3/uL (ref 0.0–0.5)
Eosinophils Relative: 1 %
HCT: 42.6 % (ref 39.0–52.0)
Hemoglobin: 14.1 g/dL (ref 13.0–17.0)
Immature Granulocytes: 0 %
Lymphocytes Relative: 23 %
Lymphs Abs: 1 10*3/uL (ref 0.7–4.0)
MCH: 32.2 pg (ref 26.0–34.0)
MCHC: 33.1 g/dL (ref 30.0–36.0)
MCV: 97.3 fL (ref 80.0–100.0)
Monocytes Absolute: 0.6 10*3/uL (ref 0.1–1.0)
Monocytes Relative: 13 %
Neutro Abs: 2.9 10*3/uL (ref 1.7–7.7)
Neutrophils Relative %: 63 %
Platelet Count: 244 10*3/uL (ref 150–400)
RBC: 4.38 MIL/uL (ref 4.22–5.81)
RDW: 13.5 % (ref 11.5–15.5)
WBC Count: 4.6 10*3/uL (ref 4.0–10.5)
nRBC: 0 % (ref 0.0–0.2)

## 2022-05-31 LAB — CMP (CANCER CENTER ONLY)
ALT: 10 U/L (ref 0–44)
AST: 18 U/L (ref 15–41)
Albumin: 4.7 g/dL (ref 3.5–5.0)
Alkaline Phosphatase: 71 U/L (ref 38–126)
Anion gap: 8 (ref 5–15)
BUN: 13 mg/dL (ref 8–23)
CO2: 28 mmol/L (ref 22–32)
Calcium: 9.8 mg/dL (ref 8.9–10.3)
Chloride: 104 mmol/L (ref 98–111)
Creatinine: 1.27 mg/dL — ABNORMAL HIGH (ref 0.61–1.24)
GFR, Estimated: >60 mL/min (ref >60)
Glucose, Bld: 65 mg/dL — ABNORMAL LOW (ref 70–99)
Potassium: 4.4 mmol/L (ref 3.5–5.1)
Sodium: 140 mmol/L (ref 135–145)
Total Bilirubin: 0.7 mg/dL (ref 0.3–1.2)
Total Protein: 7.1 g/dL (ref 6.5–8.1)

## 2022-05-31 LAB — FERRITIN: Ferritin: 28 ng/mL (ref 24–336)

## 2022-05-31 LAB — LACTATE DEHYDROGENASE: LDH: 168 U/L (ref 98–192)

## 2022-05-31 NOTE — Progress Notes (Signed)
Hematology and Oncology Follow Up Visit  Edwin Webster [id number] 11/21/1961 61 y.o. 05/31/2022   Principle Diagnosis:  Waldenstrom's macroglobulinemia Iron deficiency anemia  Current Therapy:   Rituxan/bendamustine-s/p cycle 2 -- d/c on 06/11/2018 Imbruvica  280 mg po q day -- changed on 08/06/2019 IV iron as indicated - last received in March 2022     Interim History:  Edwin Webster is here today for follow-up.  The main problem right now is his weight loss.  He is lost 10 pounds since we last saw him.  I am not sure as to why he is losing weight.  His wife says that he just does a lot and sweats quite a bit.  He is eating okay.  He does not have diarrhea.  He has had no cough.  There is no nausea or vomiting.  He is doing well in the [place].  He is on low-dose Imbruvica.  When we last saw him, his M spike was 0.3 g/dL.  His IgM level was 375 mg/dL.  He has had no rashes.  His last iron studies that were done back in April showed a ferritin of 30 with an iron saturation of 19%.  He has had no fever.  He has had no bleeding.  He has had no headache.  Overall, I was his performance status is ECOG 1.  Medications:  Allergies as of 05/31/2022       Reactions   Adalimumab Other (See Comments)   "blacked out", confused        Medication List        Accurate as of May 31, 2022 10:23 AM. If you have any questions, ask your nurse or doctor.          STOP taking these medications    azithromycin 250 MG tablet Commonly known as: Zithromax Stopped by: [name], MD   baclofen 10 MG tablet Commonly known as: LIORESAL Stopped by: [name], MD   busPIRone 7.5 MG tablet Commonly known as: BUSPAR Stopped by: [name], MD       TAKE these medications    alendronate 70 MG tablet Commonly known as: FOSAMAX TAKE 1 TABLET (70 MG TOTAL) BY MOUTH EVERY 7 (SEVEN) DAYS. TAKE WITH A FULL GLASS OF WATER ON AN EMPTY STOMACH AND AVOID RECLINING OR LYING DOWN  FOR 2 HOURS AFTER TAKING THIS MEDICATION.   aspirin EC 81 MG tablet Take 81 mg daily by mouth.   DULoxetine 30 MG capsule Commonly known as: Cymbalta Take 1 tab daily for mood and chronic pain.   ezetimibe 10 MG tablet Commonly known as: ZETIA TAKE 1 TABLET BY MOUTH EVERY DAY   famciclovir 500 MG tablet Commonly known as: FAMVIR TAKE 1 TABLET BY MOUTH EVERY DAY   famotidine 40 MG tablet Commonly known as: PEPCID Take 1 tabet Daily for Heartburn and Indigestion   folic acid 1 MG tablet Commonly known as: FOLVITE TAKE 1 TABLET DAILY FOR FOLATE DEFICIENCY   Imbruvica 280 MG tablet Generic drug: ibrutinib TAKE 1 TABLET (280 MG) BY MOUTH DAILY.   ipratropium 0.06 % nasal spray Commonly known as: ATROVENT Use 1 to 2 sprays each nostril 2 to 3 x /day as needed   levocetirizine 5 MG tablet Commonly known as: XYZAL Take  1 tablet  Daily  for Allergies   olmesartan 40 MG tablet Commonly known as: BENICAR Take 1 tablet (40 mg total) by mouth at bedtime. For BP   promethazine-dextromethorphan 6.25-15  MG/5ML syrup Commonly known as: PROMETHAZINE-DM Take 5-10 mL PO q8hrs prn for sinus symptoms   rosuvastatin 40 MG tablet Commonly known as: CRESTOR TAKE 1 TABLET BY MOUTH EVERY DAY FOR CHOLESTEROL   triamcinolone cream 0.1 % Commonly known as: KENALOG Apply 1 application topically 2 (two) times daily.   vitamin B-12 500 MCG tablet Commonly known as: CYANOCOBALAMIN Take 500 mcg by mouth daily.   Vitamin D-3 125 MCG (5000 UT) Tabs Take 5,000 Units by mouth 2 (two) times daily.        Allergies:  Allergies  Allergen Reactions   Adalimumab Other (See Comments)    "blacked out", confused     Past Medical History, Surgical history, Social history, and Family History were reviewed and updated.  Review of Systems: Review of Systems  Constitutional: Negative.   HENT: Negative.    Eyes: Negative.   Respiratory: Negative.    Cardiovascular: Negative.    Gastrointestinal: Negative.   Genitourinary: Negative.   Musculoskeletal: Negative.   Skin: Negative.   Neurological: Negative.   Endo/Heme/Allergies: Negative.   Psychiatric/Behavioral: Negative.        Physical Exam:  weight is 155 lb (70.3 kg). His oral temperature is 97.7 F (36.5 C). His blood pressure is 132/73 and his pulse is 65. His respiration is 18 and oxygen saturation is 100%.   Wt Readings from Last 3 Encounters:  05/31/22 155 lb (70.3 kg)  05/22/22 161 lb 12.8 oz (73.4 kg)  05/01/22 168 lb (76.2 kg)  Well then  Physical Exam Vitals reviewed.  HENT:     Head: Normocephalic and atraumatic.  Eyes:     Pupils: Pupils are equal, round, and reactive to light.  Cardiovascular:     Rate and Rhythm: Normal rate and regular rhythm.     Heart sounds: Normal heart sounds.  Pulmonary:     Effort: Pulmonary effort is normal.     Breath sounds: Normal breath sounds.  Abdominal:     General: Bowel sounds are normal.     Palpations: Abdomen is soft.  Musculoskeletal:        General: No tenderness or deformity. Normal range of motion.     Cervical back: Normal range of motion.  Lymphadenopathy:     Cervical: No cervical adenopathy.  Skin:    General: Skin is warm and dry.     Findings: No erythema or rash.  Neurological:     Mental Status: He is alert and oriented to person, place, and time.  Psychiatric:        Behavior: Behavior normal.        Thought Content: Thought content normal.        Judgment: Judgment normal.      Lab Results  Component Value Date   WBC 4.6 05/31/2022   HGB 14.1 05/31/2022   HCT 42.6 05/31/2022   MCV 97.3 05/31/2022   PLT 244 05/31/2022   Lab Results  Component Value Date   FERRITIN 30 03/15/2022   IRON 71 03/15/2022   TIBC 375 03/15/2022   UIBC 304 03/15/2022   IRONPCTSAT 19 03/15/2022   Lab Results  Component Value Date   RETICCTPCT 1.2 01/11/2022   RBC 4.38 05/31/2022   RETICCTABS 33,120 02/12/2018   Lab Results   Component Value Date   KPAFRELGTCHN 25.7 (H) 03/15/2022   LAMBDASER 11.7 03/15/2022   KAPLAMBRATIO 2.20 (H) 03/15/2022   Lab Results  Component Value Date   IGGSERUM 556 (L) 03/15/2022   IGGSERUM 596 (L)  03/15/2022   IGA 47 (L) 03/15/2022   IGA 48 (L) 03/15/2022   IGMSERUM 382 (H) 03/15/2022   IGMSERUM 365 (H) 03/15/2022   Lab Results  Component Value Date   TOTALPROTELP 6.1 03/15/2022   ALBUMINELP 3.8 03/15/2022   A1GS 0.2 03/15/2022   A2GS 0.6 03/15/2022   BETS 0.7 03/15/2022   BETA2SER 0.2 02/18/2018   GAMS 0.7 03/15/2022   MSPIKE 0.3 (H) 03/15/2022   SPEI Comment 01/11/2022     Chemistry      Component Value Date/Time   NA 140 05/31/2022 0931   NA 141 08/04/2020 1203   K 4.4 05/31/2022 0931   CL 104 05/31/2022 0931   CO2 28 05/31/2022 0931   BUN 13 05/31/2022 0931   BUN 13 08/04/2020 1203   CREATININE 1.27 (H) 05/31/2022 0931   CREATININE 1.15 12/06/2021 1219      Component Value Date/Time   CALCIUM 9.8 05/31/2022 0931   ALKPHOS 71 05/31/2022 0931   AST 18 05/31/2022 0931   ALT 10 05/31/2022 0931   BILITOT 0.7 05/31/2022 0931      Impression and Plan: Edwin Webster is a very pleasant 61yo caucasian gentleman with Waldenstrom's macroglobulinemia.  He is on single agent Imbruvica.  He is doing well on this from my point of view.  I am just not happy with weight loss.  Again, I am not sure as to why he would have the weight loss.  I do not think this is the [place] doing this.  I do not see any medicines that he is on that would do it.  We will just have to follow this along.  We will plan to get him back in another 3 months.  We will try to get him back after Labor Day.    [name], MD 7/6/202310:23 AM

## 2022-06-01 ENCOUNTER — Other Ambulatory Visit: Payer: Self-pay | Admitting: Internal Medicine

## 2022-06-01 LAB — IGG, IGA, IGM
IgA: 55 mg/dL — ABNORMAL LOW (ref 61–437)
IgG (Immunoglobin G), Serum: 615 mg/dL (ref 603–1613)
IgM (Immunoglobulin M), Srm: 476 mg/dL — ABNORMAL HIGH (ref 20–172)

## 2022-06-01 LAB — KAPPA/LAMBDA LIGHT CHAINS
Kappa free light chain: 32.7 mg/L — ABNORMAL HIGH (ref 3.3–19.4)
Kappa, lambda light chain ratio: 2.6 — ABNORMAL HIGH (ref 0.26–1.65)
Lambda free light chains: 12.6 mg/L (ref 5.7–26.3)

## 2022-06-05 LAB — PROTEIN ELECTROPHORESIS, SERUM, WITH REFLEX
A/G Ratio: 1.4 (ref 0.7–1.7)
Albumin ELP: 3.7 g/dL (ref 2.9–4.4)
Alpha-1-Globulin: 0.3 g/dL (ref 0.0–0.4)
Alpha-2-Globulin: 0.7 g/dL (ref 0.4–1.0)
Beta Globulin: 0.9 g/dL (ref 0.7–1.3)
Gamma Globulin: 0.9 g/dL (ref 0.4–1.8)
Globulin, Total: 2.7 g/dL (ref 2.2–3.9)
M-Spike, %: 0.4 g/dL — ABNORMAL HIGH
SPEP Interpretation: 0
Total Protein ELP: 6.4 g/dL (ref 6.0–8.5)

## 2022-06-05 LAB — IMMUNOFIXATION REFLEX, SERUM
IgA: 53 mg/dL — ABNORMAL LOW (ref 61–437)
IgG (Immunoglobin G), Serum: 633 mg/dL (ref 603–1613)
IgM (Immunoglobulin M), Srm: 480 mg/dL — ABNORMAL HIGH (ref 20–172)

## 2022-06-11 ENCOUNTER — Other Ambulatory Visit: Payer: Self-pay | Admitting: Adult Health

## 2022-06-11 DIAGNOSIS — F330 Major depressive disorder, recurrent, mild: Secondary | ICD-10-CM

## 2022-06-11 DIAGNOSIS — G8929 Other chronic pain: Secondary | ICD-10-CM

## 2022-06-25 ENCOUNTER — Other Ambulatory Visit: Payer: Self-pay | Admitting: Hematology & Oncology

## 2022-06-25 ENCOUNTER — Other Ambulatory Visit (HOSPITAL_COMMUNITY): Payer: Self-pay

## 2022-06-25 DIAGNOSIS — C880 Waldenstrom macroglobulinemia: Secondary | ICD-10-CM

## 2022-06-25 MED ORDER — IBRUTINIB 280 MG PO TABS
ORAL_TABLET | ORAL | 3 refills | Status: DC
  Filled 2022-06-25: qty 28, 28d supply, fill #0
  Filled 2022-07-20: qty 28, 28d supply, fill #1
  Filled 2022-08-16: qty 28, 28d supply, fill #2
  Filled 2022-09-12: qty 28, 28d supply, fill #3

## 2022-06-27 ENCOUNTER — Other Ambulatory Visit (HOSPITAL_COMMUNITY): Payer: Self-pay

## 2022-07-20 ENCOUNTER — Other Ambulatory Visit (HOSPITAL_COMMUNITY): Payer: Self-pay

## 2022-07-25 ENCOUNTER — Other Ambulatory Visit (HOSPITAL_COMMUNITY): Payer: Self-pay

## 2022-07-26 ENCOUNTER — Other Ambulatory Visit (HOSPITAL_COMMUNITY): Payer: Self-pay

## 2022-08-09 ENCOUNTER — Ambulatory Visit (INDEPENDENT_AMBULATORY_CARE_PROVIDER_SITE_OTHER): Payer: 59

## 2022-08-09 DIAGNOSIS — I495 Sick sinus syndrome: Secondary | ICD-10-CM | POA: Diagnosis not present

## 2022-08-09 LAB — CUP PACEART REMOTE DEVICE CHECK
Battery Remaining Longevity: 107 mo
Battery Remaining Percentage: 85 %
Battery Voltage: 3.01 V
Brady Statistic AP VP Percent: 1 %
Brady Statistic AP VS Percent: 70 %
Brady Statistic AS VP Percent: 1 %
Brady Statistic AS VS Percent: 30 %
Brady Statistic RA Percent Paced: 70 %
Brady Statistic RV Percent Paced: 1 %
Date Time Interrogation Session: 20230914020017
Implantable Lead Implant Date: 20210915
Implantable Lead Implant Date: 20210915
Implantable Lead Location: 753859
Implantable Lead Location: 753860
Implantable Pulse Generator Implant Date: 20210915
Lead Channel Impedance Value: 440 Ohm
Lead Channel Impedance Value: 550 Ohm
Lead Channel Pacing Threshold Amplitude: 0.5 V
Lead Channel Pacing Threshold Amplitude: 0.75 V
Lead Channel Pacing Threshold Pulse Width: 0.5 ms
Lead Channel Pacing Threshold Pulse Width: 0.5 ms
Lead Channel Sensing Intrinsic Amplitude: 11.5 mV
Lead Channel Sensing Intrinsic Amplitude: 3.6 mV
Lead Channel Setting Pacing Amplitude: 1 V
Lead Channel Setting Pacing Amplitude: 1.5 V
Lead Channel Setting Pacing Pulse Width: 0.5 ms
Lead Channel Setting Sensing Sensitivity: 2 mV
Pulse Gen Model: 2272
Pulse Gen Serial Number: 3864545

## 2022-08-10 ENCOUNTER — Inpatient Hospital Stay (HOSPITAL_BASED_OUTPATIENT_CLINIC_OR_DEPARTMENT_OTHER): Payer: Medicare HMO | Admitting: Hematology & Oncology

## 2022-08-10 ENCOUNTER — Inpatient Hospital Stay: Payer: Medicare HMO | Attending: Hematology & Oncology

## 2022-08-10 ENCOUNTER — Other Ambulatory Visit: Payer: Self-pay | Admitting: Family

## 2022-08-10 ENCOUNTER — Encounter: Payer: Self-pay | Admitting: Hematology & Oncology

## 2022-08-10 ENCOUNTER — Other Ambulatory Visit: Payer: Self-pay

## 2022-08-10 ENCOUNTER — Encounter: Payer: Self-pay | Admitting: *Deleted

## 2022-08-10 VITALS — BP 160/89 | HR 69 | Temp 97.5°F | Resp 16 | Wt 165.0 lb

## 2022-08-10 DIAGNOSIS — R5383 Other fatigue: Secondary | ICD-10-CM | POA: Insufficient documentation

## 2022-08-10 DIAGNOSIS — D500 Iron deficiency anemia secondary to blood loss (chronic): Secondary | ICD-10-CM

## 2022-08-10 DIAGNOSIS — D509 Iron deficiency anemia, unspecified: Secondary | ICD-10-CM | POA: Insufficient documentation

## 2022-08-10 DIAGNOSIS — C8800 Waldenstrom macroglobulinemia not having achieved remission: Secondary | ICD-10-CM

## 2022-08-10 LAB — LACTATE DEHYDROGENASE: LDH: 173 U/L (ref 98–192)

## 2022-08-10 LAB — CMP (CANCER CENTER ONLY)
ALT: 11 U/L (ref 0–44)
AST: 19 U/L (ref 15–41)
Albumin: 4.6 g/dL (ref 3.5–5.0)
Alkaline Phosphatase: 70 U/L (ref 38–126)
Anion gap: 8 (ref 5–15)
BUN: 15 mg/dL (ref 8–23)
CO2: 27 mmol/L (ref 22–32)
Calcium: 9.4 mg/dL (ref 8.9–10.3)
Chloride: 103 mmol/L (ref 98–111)
Creatinine: 1.3 mg/dL — ABNORMAL HIGH (ref 0.61–1.24)
GFR, Estimated: >60 mL/min (ref >60)
Glucose, Bld: 108 mg/dL — ABNORMAL HIGH (ref 70–99)
Potassium: 4 mmol/L (ref 3.5–5.1)
Sodium: 138 mmol/L (ref 135–145)
Total Bilirubin: 0.7 mg/dL (ref 0.3–1.2)
Total Protein: 7.5 g/dL (ref 6.5–8.1)

## 2022-08-10 LAB — CBC WITH DIFFERENTIAL (CANCER CENTER ONLY)
Abs Immature Granulocytes: 0.01 10*3/uL (ref 0.00–0.07)
Basophils Absolute: 0 10*3/uL (ref 0.0–0.1)
Basophils Relative: 1 %
Eosinophils Absolute: 0.1 10*3/uL (ref 0.0–0.5)
Eosinophils Relative: 2 %
HCT: 42.3 % (ref 39.0–52.0)
Hemoglobin: 14.1 g/dL (ref 13.0–17.0)
Immature Granulocytes: 0 %
Lymphocytes Relative: 23 %
Lymphs Abs: 0.8 10*3/uL (ref 0.7–4.0)
MCH: 32 pg (ref 26.0–34.0)
MCHC: 33.3 g/dL (ref 30.0–36.0)
MCV: 96.1 fL (ref 80.0–100.0)
Monocytes Absolute: 0.4 10*3/uL (ref 0.1–1.0)
Monocytes Relative: 12 %
Neutro Abs: 2.2 10*3/uL (ref 1.7–7.7)
Neutrophils Relative %: 62 %
Platelet Count: 221 10*3/uL (ref 150–400)
RBC: 4.4 MIL/uL (ref 4.22–5.81)
RDW: 13.2 % (ref 11.5–15.5)
WBC Count: 3.5 10*3/uL — ABNORMAL LOW (ref 4.0–10.5)
nRBC: 0 % (ref 0.0–0.2)

## 2022-08-10 LAB — TSH: TSH: 2.137 u[IU]/mL (ref 0.350–4.500)

## 2022-08-10 LAB — SAVE SMEAR(SSMR), FOR PROVIDER SLIDE REVIEW

## 2022-08-10 LAB — FERRITIN: Ferritin: 20 ng/mL — ABNORMAL LOW (ref 24–336)

## 2022-08-10 LAB — IRON AND IRON BINDING CAPACITY (CC-WL,HP ONLY)
Iron: 63 ug/dL (ref 45–182)
Saturation Ratios: 15 % — ABNORMAL LOW (ref 17.9–39.5)
TIBC: 421 ug/dL (ref 250–450)
UIBC: 358 ug/dL

## 2022-08-10 NOTE — Progress Notes (Signed)
Hematology and Oncology Follow Up Visit  Edwin Webster [id number] 04-03-1961 61 y.o. 08/10/2022   Principle Diagnosis:  Waldenstrom's macroglobulinemia Iron deficiency anemia  Current Therapy:   Rituxan/bendamustine-s/p cycle 2 -- d/c on 06/11/2018 Imbruvica  280 mg po q day -- changed on 08/06/2019 IV iron as indicated - last received in March 2022     Interim History:  Edwin Webster is here today for follow-up.  He finally has gained weight.  He is gained 10 pounds since we last saw him.  However, he is still quite tired.  When we last saw him back in July, his ferritin was 28 with an iron saturation of 19%.  We will have to watch this today.  His Waldenstrom's studies are holding pretty steady.  When we last saw him, his M spike was 0.4 g/dL.  The IgM level is up a little bit at 450 mg/dL.  The Kappa light chain was 3.3 mg/dL.  He has had no change in bowel or bladder habits.  He has had no cough or shortness of breath.  He has had no fever.  He has had no bleeding.  He has had no problems with his pacemaker.  He has had no issues with cardiac problems.  Overall, I would say his performance status is probably ECOG 1.     Medications:  Allergies as of 08/10/2022       Reactions   Adalimumab Other (See Comments)   "blacked out", confused        Medication List        Accurate as of August 10, 2022 10:22 AM. If you have any questions, ask your nurse or doctor.          alendronate 70 MG tablet Commonly known as: FOSAMAX TAKE 1 TABLET (70 MG TOTAL) BY MOUTH EVERY 7 (SEVEN) DAYS. TAKE WITH A FULL GLASS OF WATER ON AN EMPTY STOMACH AND AVOID RECLINING OR LYING DOWN FOR 2 HOURS AFTER TAKING THIS MEDICATION.   aspirin EC 81 MG tablet Take 81 mg daily by mouth.   baclofen 10 MG tablet Commonly known as: LIORESAL SMARTSIG:1.5-2 Tablet(s) By Mouth Twice Daily PRN   busPIRone 7.5 MG tablet Commonly known as: BUSPAR Take 7.5 mg by mouth 3 (three) times daily.    cyanocobalamin 500 MCG tablet Commonly known as: VITAMIN B12 Take 500 mcg by mouth daily.   DULoxetine 30 MG capsule Commonly known as: CYMBALTA TAKE 1 CAPSULE BY MOUTH DAILY FOR MOOD AND CHRONIC PAIN.   ezetimibe 10 MG tablet Commonly known as: ZETIA TAKE 1 TABLET BY MOUTH EVERY DAY   famciclovir 500 MG tablet Commonly known as: FAMVIR TAKE 1 TABLET BY MOUTH EVERY DAY   famotidine 40 MG tablet Commonly known as: PEPCID Take 1 tabet Daily for Heartburn and Indigestion   folic acid 1 MG tablet Commonly known as: FOLVITE TAKE 1 TABLET DAILY FOR FOLATE DEFICIENCY   Imbruvica 280 MG tablet Generic drug: ibrutinib TAKE 1 TABLET (280 MG) BY MOUTH DAILY.   ipratropium 0.06 % nasal spray Commonly known as: ATROVENT Use 1 to 2 sprays each nostril 2 to 3 x /day as needed   levocetirizine 5 MG tablet Commonly known as: XYZAL Take  1 tablet  Daily  for Allergies   olmesartan 40 MG tablet Commonly known as: BENICAR Take 1 tablet (40 mg total) by mouth at bedtime. For BP   promethazine-dextromethorphan 6.25-15 MG/5ML syrup Commonly known as: PROMETHAZINE-DM Take 5-10 mL PO q8hrs prn for sinus  symptoms   rosuvastatin 40 MG tablet Commonly known as: CRESTOR TAKE 1 TABLET BY MOUTH EVERY DAY FOR CHOLESTEROL   triamcinolone cream 0.1 % Commonly known as: KENALOG Apply 1 application topically 2 (two) times daily.   Vitamin D-3 125 MCG (5000 UT) Tabs Take 5,000 Units by mouth 2 (two) times daily.        Allergies:  Allergies  Allergen Reactions   Adalimumab Other (See Comments)    "blacked out", confused     Past Medical History, Surgical history, Social history, and Family History were reviewed and updated.  Review of Systems: Review of Systems  Constitutional: Negative.   HENT: Negative.    Eyes: Negative.   Respiratory: Negative.    Cardiovascular: Negative.   Gastrointestinal: Negative.   Genitourinary: Negative.   Musculoskeletal: Negative.   Skin:  Negative.   Neurological: Negative.   Endo/Heme/Allergies: Negative.   Psychiatric/Behavioral: Negative.        Physical Exam:  weight is 165 lb (74.8 kg). His oral temperature is 97.5 F (36.4 C) (abnormal). His blood pressure is 157/101 (abnormal) and his pulse is 69. His respiration is 16 and oxygen saturation is 98%.   Wt Readings from Last 3 Encounters:  08/10/22 165 lb (74.8 kg)  05/31/22 155 lb (70.3 kg)  05/22/22 161 lb 12.8 oz (73.4 kg)  Well then  Physical Exam Vitals reviewed.  HENT:     Head: Normocephalic and atraumatic.  Eyes:     Pupils: Pupils are equal, round, and reactive to light.  Cardiovascular:     Rate and Rhythm: Normal rate and regular rhythm.     Heart sounds: Normal heart sounds.  Pulmonary:     Effort: Pulmonary effort is normal.     Breath sounds: Normal breath sounds.  Abdominal:     General: Bowel sounds are normal.     Palpations: Abdomen is soft.  Musculoskeletal:        General: No tenderness or deformity. Normal range of motion.     Cervical back: Normal range of motion.  Lymphadenopathy:     Cervical: No cervical adenopathy.  Skin:    General: Skin is warm and dry.     Findings: No erythema or rash.  Neurological:     Mental Status: He is alert and oriented to person, place, and time.  Psychiatric:        Behavior: Behavior normal.        Thought Content: Thought content normal.        Judgment: Judgment normal.      Lab Results  Component Value Date   WBC 3.5 (L) 08/10/2022   HGB 14.1 08/10/2022   HCT 42.3 08/10/2022   MCV 96.1 08/10/2022   PLT 221 08/10/2022   Lab Results  Component Value Date   FERRITIN 28 05/31/2022   IRON 72 05/31/2022   TIBC 382 05/31/2022   UIBC 310 05/31/2022   IRONPCTSAT 19 05/31/2022   Lab Results  Component Value Date   RETICCTPCT 1.2 01/11/2022   RBC 4.40 08/10/2022   RETICCTABS 33,120 02/12/2018   Lab Results  Component Value Date   KPAFRELGTCHN 32.7 (H) 05/31/2022   LAMBDASER  12.6 05/31/2022   KAPLAMBRATIO 2.60 (H) 05/31/2022   Lab Results  Component Value Date   IGGSERUM 615 05/31/2022   IGGSERUM 633 05/31/2022   IGA 55 (L) 05/31/2022   IGA 53 (L) 05/31/2022   IGMSERUM 476 (H) 05/31/2022   IGMSERUM 480 (H) 05/31/2022   Lab Results  Component Value Date   TOTALPROTELP 6.4 05/31/2022   ALBUMINELP 3.7 05/31/2022   A1GS 0.3 05/31/2022   A2GS 0.7 05/31/2022   BETS 0.9 05/31/2022   BETA2SER 0.2 02/18/2018   GAMS 0.9 05/31/2022   MSPIKE 0.4 (H) 05/31/2022   SPEI Comment 01/11/2022     Chemistry      Component Value Date/Time   NA 138 08/10/2022 0928   NA 141 08/04/2020 1203   K 4.0 08/10/2022 0928   CL 103 08/10/2022 0928   CO2 27 08/10/2022 0928   BUN 15 08/10/2022 0928   BUN 13 08/04/2020 1203   CREATININE 1.30 (H) 08/10/2022 0928   CREATININE 1.15 12/06/2021 1219      Component Value Date/Time   CALCIUM 9.4 08/10/2022 0928   ALKPHOS 70 08/10/2022 0928   AST 19 08/10/2022 0928   ALT 11 08/10/2022 0928   BILITOT 0.7 08/10/2022 0928      Impression and Plan: Edwin Webster is a very pleasant 61yo caucasian gentleman with Waldenstrom's macroglobulinemia.  He is on single agent Imbruvica.  He is doing well on this from my point of view.  Will be very interesting to see what his monoclonal studies look like this time.  If we do see that they are going up, we may have to make a change with his protocol.  I am happy that he did gain weight.  We will have to see what his iron studies look like.  I think these will be important for [place].  I hate that he still has fatigue.  I would not think this to be from the [facility] but it certainly could.  The fatigue could also be from the [employer].  I would like to get him back before the Holidays.  I would like to make sure that we try to optimize his performance status for the holiday season.   [name], MD 9/15/202310:22 AM

## 2022-08-11 LAB — TESTOSTERONE: Testosterone: 697 ng/dL (ref 264–916)

## 2022-08-12 LAB — IGG, IGA, IGM
IgA: 51 mg/dL — ABNORMAL LOW (ref 61–437)
IgG (Immunoglobin G), Serum: 567 mg/dL — ABNORMAL LOW (ref 603–1613)
IgM (Immunoglobulin M), Srm: 429 mg/dL — ABNORMAL HIGH (ref 20–172)

## 2022-08-13 LAB — KAPPA/LAMBDA LIGHT CHAINS
Kappa free light chain: 25.2 mg/L — ABNORMAL HIGH (ref 3.3–19.4)
Kappa, lambda light chain ratio: 2.21 — ABNORMAL HIGH (ref 0.26–1.65)
Lambda free light chains: 11.4 mg/L (ref 5.7–26.3)

## 2022-08-16 ENCOUNTER — Other Ambulatory Visit (HOSPITAL_COMMUNITY): Payer: Self-pay

## 2022-08-16 ENCOUNTER — Inpatient Hospital Stay: Payer: Medicare HMO

## 2022-08-16 VITALS — BP 157/88 | HR 68 | Temp 97.0°F | Resp 17

## 2022-08-16 DIAGNOSIS — D500 Iron deficiency anemia secondary to blood loss (chronic): Secondary | ICD-10-CM

## 2022-08-16 DIAGNOSIS — C880 Waldenstrom macroglobulinemia: Secondary | ICD-10-CM | POA: Diagnosis not present

## 2022-08-16 MED ORDER — SODIUM CHLORIDE 0.9 % IV SOLN: Freq: Once | INTRAVENOUS | Status: AC

## 2022-08-16 MED ORDER — SODIUM CHLORIDE 0.9 % IV SOLN
300.0000 mg | Freq: Once | INTRAVENOUS | Status: AC
  Administered 2022-08-16: 300 mg via INTRAVENOUS
  Filled 2022-08-16: qty 300

## 2022-08-16 NOTE — Patient Instructions (Signed)

## 2022-08-16 NOTE — Progress Notes (Signed)
Pt declined to stay for post infusion observation period. Pt stated she has tolerated medication multiple times prior without difficulty. Pt aware to call clinic with any questions or concerns. Pt verbalized understanding and had no further questions.  ? ?

## 2022-08-20 ENCOUNTER — Encounter: Payer: Self-pay | Admitting: Nurse Practitioner

## 2022-08-20 ENCOUNTER — Ambulatory Visit (INDEPENDENT_AMBULATORY_CARE_PROVIDER_SITE_OTHER): Payer: Medicare HMO | Admitting: Nurse Practitioner

## 2022-08-20 VITALS — BP 122/78 | HR 66 | Temp 97.7°F | Ht 69.0 in | Wt 165.0 lb

## 2022-08-20 DIAGNOSIS — Z79899 Other long term (current) drug therapy: Secondary | ICD-10-CM

## 2022-08-20 DIAGNOSIS — J0100 Acute maxillary sinusitis, unspecified: Secondary | ICD-10-CM

## 2022-08-20 DIAGNOSIS — R051 Acute cough: Secondary | ICD-10-CM | POA: Diagnosis not present

## 2022-08-20 DIAGNOSIS — R6883 Chills (without fever): Secondary | ICD-10-CM | POA: Diagnosis not present

## 2022-08-20 DIAGNOSIS — G4483 Primary cough headache: Secondary | ICD-10-CM

## 2022-08-20 DIAGNOSIS — F330 Major depressive disorder, recurrent, mild: Secondary | ICD-10-CM

## 2022-08-20 DIAGNOSIS — J029 Acute pharyngitis, unspecified: Secondary | ICD-10-CM

## 2022-08-20 MED ORDER — AZITHROMYCIN 500 MG PO TABS: ORAL_TABLET | ORAL | 0 refills | Status: DC

## 2022-08-20 MED ORDER — PROMETHAZINE-DM 6.25-15 MG/5ML PO SYRP: 5.0000 mL | ORAL_SOLUTION | Freq: Four times a day (QID) | ORAL | 0 refills | Status: DC | PRN

## 2022-08-20 NOTE — Patient Instructions (Addendum)
Take Duloxetine (Cymbalta) 30 mg every other day for 2 weeks-stop after 2-3 weeks. Start Escitaprolam (Lexapro) 10 mg on the 2nd week and the days you do not take Cymbalta (every other day).     Upper Respiratory Infection, Adult An upper respiratory infection (URI) is a common viral infection of the nose, throat, and upper air passages that lead to the lungs. The most common type of URI is the common cold. URIs usually get better on their own, without medical treatment. What are the causes? A URI is caused by a virus. You may catch a virus by: Breathing in droplets from an infected person's cough or sneeze. Touching something that has been exposed to the virus (is contaminated) and then touching your mouth, nose, or eyes. What increases the risk? You are more likely to get a URI if: You are very young or very old. You have close contact with others, such as at work, school, or a health care facility. You smoke. You have long-term (chronic) heart or lung disease. You have a weakened disease-fighting system (immune system). You have nasal allergies or asthma. You are experiencing a lot of stress. You have poor nutrition. What are the signs or symptoms? A URI usually involves some of the following symptoms: Runny or stuffy (congested) nose. Cough. Sneezing. Sore throat. Headache. Fatigue. Fever. Loss of appetite. Pain in your forehead, behind your eyes, and over your cheekbones (sinus pain). Muscle aches. Redness or irritation of the eyes. Pressure in the ears or face. How is this diagnosed? This condition may be diagnosed based on your medical history and symptoms, and a physical exam. Your health care provider may use a swab to take a mucus sample from your nose (nasal swab). This sample can be tested to determine what virus is causing the illness. How is this treated? URIs usually get better on their own within 7-10 days. Medicines cannot cure URIs, but your health care provider  may recommend certain medicines to help relieve symptoms, such as: Over-the-counter cold medicines. Cough suppressants. Coughing is a type of defense against infection that helps to clear the respiratory system, so take these medicines only as recommended by your health care provider. Fever-reducing medicines. Follow these instructions at home: Activity Rest as needed. If you have a fever, stay home from work or school until your fever is gone or until your health care provider says your URI cannot spread to other people (is no longer contagious). Your health care provider may have you wear a face mask to prevent your infection from spreading. Relieving symptoms Gargle with a mixture of salt and water 3-4 times a day or as needed. To make salt water, completely dissolve -1 tsp (3-6 g) of salt in 1 cup (237 mL) of warm water. Use a cool-mist humidifier to add moisture to the air. This can help you breathe more easily. Eating and drinking  Drink enough fluid to keep your urine pale yellow. Eat soups and other clear broths. General instructions  Take over-the-counter and prescription medicines only as told by your health care provider. These include cold medicines, fever reducers, and cough suppressants. Do not use any products that contain nicotine or tobacco. These products include cigarettes, chewing tobacco, and vaping devices, such as e-cigarettes. If you need help quitting, ask your health care provider. Stay away from secondhand smoke. Stay up to date on all immunizations, including the yearly (annual) flu vaccine. Keep all follow-up visits. This is important. How to prevent the spread of infection  to others URIs can be contagious. To prevent the infection from spreading: Wash your hands with soap and water for at least 20 seconds. If soap and water are not available, use hand sanitizer. Avoid touching your mouth, face, eyes, or nose. Cough or sneeze into a tissue or your sleeve or  elbow instead of into your hand or into the air.  Contact a health care provider if: You are getting worse instead of better. You have a fever or chills. Your mucus is [place] or red. You have yellow or Milanese discharge coming from your nose. You have pain in your face, especially when you bend forward. You have swollen neck glands. You have pain while swallowing. You have white areas in the back of your throat. Get help right away if: You have shortness of breath that gets worse. You have severe or persistent: Headache. Ear pain. Sinus pain. Chest pain. You have chronic lung disease along with any of the following: Making high-pitched whistling sounds when you breathe, most often when you breathe out (wheezing). Prolonged cough (more than 14 days). Coughing up blood. A change in your usual mucus. You have a stiff neck. You have changes in your: Vision. Hearing. Thinking. Mood. These symptoms may be an emergency. Get help right away. Call 911. Do not wait to see if the symptoms will go away. Do not drive yourself to the hospital. Summary An upper respiratory infection (URI) is a common infection of the nose, throat, and upper air passages that lead to the lungs. A URI is caused by a virus. URIs usually get better on their own within 7-10 days. Medicines cannot cure URIs, but your health care provider may recommend certain medicines to help relieve symptoms. This information is not intended to replace advice given to you by your health care provider. Make sure you discuss any questions you have with your health care provider. Document Revised: 06/14/2021 Document Reviewed: 06/14/2021 Elsevier Patient Education  [place].

## 2022-08-20 NOTE — Progress Notes (Signed)
Assessment and Plan:  [name] was seen today for an episodic visit.  Diagnoses and all order for this visit:  1. Acute non-recurrent maxillary sinusitis Stay well hydrated to keep mucus thin and productive.  - azithromycin (ZITHROMAX) 500 MG tablet; Take 1 tab (500 mg) tab daily for 10 days  Dispense: 10 tablet; Refill: 0  2. Acute cough  - promethazine-dextromethorphan (PROMETHAZINE-DM) 6.25-15 MG/5ML syrup; Take 5 mLs by mouth 4 (four) times daily as needed for cough.  Dispense: 240 mL; Refill: 0  3. Primary cough headache OTC IBU or Tylenol for headache pain. Push fluids  4. Chills Monitor for increase in fever. If uncontrolled contact office or report to ER  5. Sorethroat Warm salt water gargles several times throughout the day. Mucinex to help dry up secretions and lesson PND.  6. Medication management All medications discussed and reviewed in full. All questions and concerns regarding medications addressed.     7. Major depressive disorder, recurrent episode, mild with anxious distress ([place]) Start weaning Cymbalta - take QOD for 2 weeks May restart Lexapro 10 mg 2nd week QOD when not taking Cymbalta to help bridge. Continue to monitor   Continue to monitor for any increase in fever, chills, N/V, diarrhea, changes to bowel habits, blood in stool.  Notify office for further evaluation and treatment, questions or concerns if s/s fail to improve. The risks and benefits of my recommendations, as well as other treatment options were discussed with the patient today. Questions were answered.  Further disposition pending results of labs. Discussed med's effects and SE's.    Over 20 minutes of exam, counseling, chart review, and critical decision making was performed.   Future Appointments  Date Time Provider [place]  09/24/2022  1:45 PM Constance Haw, MD [facility]  09/26/2022  2:00 PM Unk Pinto, MD [facility] None  10/15/2022   9:45 AM [facility] None  10/15/2022 10:00 AM Ennever, Rudell Cobb, MD [facility] None  11/08/2022  7:10 AM [facility] DEVICE REMOTES [facility]  12/06/2022 11:00 AM Darrol Jump, NP [facility] None  02/07/2023  7:10 AM [facility] DEVICE REMOTES [facility]  05/09/2023  7:10 AM [facility] DEVICE REMOTES [facility]    ------------------------------------------------------------------------------------------------------------------   HPI BP 122/78   Pulse 66   Temp 97.7 F (36.5 C)   Ht '5\' 9"'$  (1.753 m)   Wt 165 lb (74.8 kg)   SpO2 97%   BMI 24.37 kg/m    Patient complains of symptoms of a URI, possible sinusitis. Symptoms include achiness, congestion, cough described as productive, facial pain, post nasal drip, and sinus pressure, chills. Onset of symptoms was 1 week ago, and has been gradually worsening since that time. Treatment to date: antihistamines, cough suppressants, and decongestants.  Reports a negative home covid text x 2.  He also reports that he has been more moody and irritable.  Had changed Lexapro to Cymbalta which is no longer effective.  He is requesting to switch back to Lexapro.    Past Medical History:  Diagnosis Date   Allergy    seasonal allergies   Anemia    Anxiety    on meds   Arthritis    Chest pain, non-cardiac    COPD (chronic obstructive pulmonary disease) ([place])    past smoker   Counseling regarding goals of care 03/27/2018   Depression    on meds   Diverticulitis    Diverticulosis    Fractured pelvis ([place])    fall from  ladder   GERD (gastroesophageal reflux disease)    on meds   Gout    History of shingles    Hyperlipidemia    on meds   Hypertension    on meds   Internal hemorrhoids    Iron deficiency anemia 03/13/2018   Kidney stones    Osteoporosis    Other abnormal glucose 10/11/2015   Rheumatoid arthritis ([facility])    hands   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 TAKE WITH A FULL GLASS OF WATER ON AN EMPTY STOMACH AND AVOID RECLINING OR LYING DOWN FOR 2 HOURS AFTER TAKING THIS MEDICATION.   aspirin EC 81 MG tablet Take 81 mg daily by mouth.   baclofen (LIORESAL) 10 MG tablet SMARTSIG:1.5-2 Tablet(s) By Mouth Twice Daily PRN   busPIRone (BUSPAR) 7.5 MG tablet Take 7.5 mg by mouth 3 (three) times daily.   Cholecalciferol (VITAMIN D-3) 5000 UNITS TABS Take 5,000 Units by mouth 2 (two) times daily.    DULoxetine (CYMBALTA) 30 MG capsule TAKE 1 CAPSULE BY MOUTH DAILY FOR MOOD AND CHRONIC PAIN.   ezetimibe (ZETIA) 10 MG tablet TAKE 1 TABLET BY MOUTH EVERY DAY   famciclovir (FAMVIR) 500 MG tablet TAKE 1 TABLET BY MOUTH EVERY DAY   famotidine (PEPCID) 40 MG tablet Take 1 tabet Daily for Heartburn and Indigestion   folic acid (FOLVITE) 1 MG tablet TAKE 1 TABLET DAILY FOR FOLATE DEFICIENCY   ibrutinib (IMBRUVICA) 280 MG tablet TAKE 1 TABLET (280 MG) BY MOUTH DAILY.   ipratropium (ATROVENT) 0.06 % nasal spray Use 1 to 2 sprays each nostril 2 to 3 x /day as needed   levocetirizine (XYZAL) 5 MG tablet Take  1 tablet  Daily  for Allergies   olmesartan (BENICAR) 40 MG tablet Take 1 tablet (40 mg total) by mouth at bedtime. For BP   rosuvastatin (CRESTOR) 40 MG tablet TAKE 1 TABLET BY MOUTH EVERY DAY FOR CHOLESTEROL   triamcinolone cream (KENALOG) 0.1 % Apply 1 application topically 2 (two) times daily.   vitamin B-12 (CYANOCOBALAMIN) 500 MCG tablet Take 500 mcg by mouth daily.   No current facility-administered medications on file prior to visit.    ROS: all negative except what is noted in the HPI.   Physical Exam:  BP 122/78   Pulse 66   Temp 97.7 F (36.5 C)   Ht '5\' 9"'$  (1.753 m)   Wt 165  lb (74.8 kg)   SpO2 97%   BMI 24.37 kg/m   General Appearance: NAD.  Awake, conversant and cooperative. Eyes: PERRLA, EOMs intact.  Sclera white.  Conjunctiva without erythema. Sinuses: Frontal/maxillary tenderness.  No nasal discharge. Nares patent.  ENT/Mouth: Ext aud canals clear.  Bilateral TMs w/DOL and without erythema or bulging. Hearing intact.  Posterior pharynx without swelling or exudate.  Tonsils without swelling or erythema.  Neck: Supple.  No masses, nodules or thyromegaly. Respiratory: Effort is regular with non-labored breathing. Breath sounds are equal bilaterally without rales, rhonchi, wheezing or stridor.  Cardio: RRR with no MRGs. Brisk peripheral pulses without edema.  Abdomen: Active BS in all four quadrants.  Soft and non-tender without guarding, rebound tenderness, hernias or masses. Lymphatics: Non tender without lymphadenopathy.  Musculoskeletal: Full ROM, 5/5 strength, normal ambulation.  No clubbing or cyanosis. Skin: Appropriate color for ethnicity. Warm without rashes, lesions, ecchymosis, ulcers.  Neuro: CN II-XII grossly normal. Normal muscle tone without cerebellar symptoms and intact sensation.   Psych: AO X 3,  appropriate mood and affect, insight and judgment.     Darrol Jump, NP 4:28 PM [facility]

## 2022-08-21 ENCOUNTER — Other Ambulatory Visit (HOSPITAL_COMMUNITY): Payer: Self-pay

## 2022-08-22 ENCOUNTER — Other Ambulatory Visit: Payer: Self-pay | Admitting: Internal Medicine

## 2022-08-22 DIAGNOSIS — R109 Unspecified abdominal pain: Secondary | ICD-10-CM

## 2022-08-22 NOTE — Progress Notes (Signed)
Remote pacemaker transmission.   

## 2022-09-04 ENCOUNTER — Other Ambulatory Visit: Payer: Self-pay | Admitting: Nurse Practitioner

## 2022-09-04 DIAGNOSIS — G8929 Other chronic pain: Secondary | ICD-10-CM

## 2022-09-04 DIAGNOSIS — F330 Major depressive disorder, recurrent, mild: Secondary | ICD-10-CM

## 2022-09-12 ENCOUNTER — Other Ambulatory Visit (HOSPITAL_COMMUNITY): Payer: Self-pay

## 2022-09-17 ENCOUNTER — Other Ambulatory Visit (HOSPITAL_COMMUNITY): Payer: Self-pay

## 2022-09-24 ENCOUNTER — Encounter: Payer: Self-pay | Admitting: Cardiology

## 2022-09-24 ENCOUNTER — Ambulatory Visit: Payer: Medicare HMO | Attending: Cardiology | Admitting: Cardiology

## 2022-09-24 VITALS — BP 130/72 | HR 66 | Ht 69.0 in | Wt 164.8 lb

## 2022-09-24 DIAGNOSIS — I495 Sick sinus syndrome: Secondary | ICD-10-CM | POA: Diagnosis not present

## 2022-09-24 DIAGNOSIS — Z950 Presence of cardiac pacemaker: Secondary | ICD-10-CM | POA: Diagnosis not present

## 2022-09-24 NOTE — Patient Instructions (Signed)
Medication Instructions:  Your physician recommends that you continue on your current medications as directed. Please refer to the Current Medication list given to you today.  *If you need a refill on your cardiac medications before your next appointment, please call your pharmacy*   Lab Work: None ordered.  If you have labs (blood work) drawn today and your tests are completely normal, you will receive your results only by: [place] (if you have MyChart) OR A paper copy in the mail If you have any lab test that is abnormal or we need to change your treatment, we will call you to review the results.   Testing/Procedures: None ordered.    Follow-Up: At [facility], you and your health needs are our priority.  As part of our continuing mission to provide you with exceptional heart care, we have created designated Provider Care Teams.  These Care Teams include your primary Cardiologist (physician) and Advanced Practice Providers (APPs -  Physician Assistants and Nurse Practitioners) who all work together to provide you with the care you need, when you need it.  We recommend signing up for the patient portal called "MyChart".  Sign up information is provided on this After Visit Summary.  MyChart is used to connect with patients for Virtual Visits (Telemedicine).  Patients are able to view lab/test results, encounter notes, upcoming appointments, etc.  Non-urgent messages can be sent to your provider as well.   To learn more about what you can do with MyChart, go to [email or web address].    Your next appointment:   12 months with Dr Curt Bears  Important Information About Sugar

## 2022-09-24 NOTE — Progress Notes (Signed)
Electrophysiology Office Note   Date:  09/24/2022   ID:  Edwin Webster, DOB 11/19/1961, MRN [id number]  PCP:  Edwin Pinto, MD  Cardiologist:  Gwenlyn Found Primary Electrophysiologist:  Adiana Smelcer Meredith Leeds, MD    Chief Complaint: palpitations   History of Present Illness: Edwin Webster is a 61 y.o. male who is being seen today for the evaluation of SVT at the request of Edwin Pinto, MD. Presenting today for electrophysiology evaluation.  He has a history significant for COPD, hyperlipidemia, Walden Strom's macroglobulinemia.  He wore a cardiac monitor that showed heart rates in the 30s and 40s with symptoms of dyspnea and fatigue.  He is post Environmental health practitioner dual-chamber pacemaker implanted 08/10/2020.  Today, denies symptoms of palpitations, chest pain, shortness of breath, orthopnea, PND, lower extremity edema, claudication, dizziness, presyncope, syncope, bleeding, or neurologic sequela. The patient is tolerating medications without difficulties.     Past Medical History:  Diagnosis Date   Allergy    seasonal allergies   Anemia    Anxiety    on meds   Arthritis    Chest pain, non-cardiac    COPD (chronic obstructive pulmonary disease) ([place])    past smoker   Counseling regarding goals of care 03/27/2018   Depression    on meds   Diverticulitis    Diverticulosis    Fractured pelvis ([place])    fall from ladder   GERD (gastroesophageal reflux disease)    on meds   Gout    History of shingles    Hyperlipidemia    on meds   Hypertension    on meds   Internal hemorrhoids    Iron deficiency anemia 03/13/2018   Kidney stones    Osteoporosis    Other abnormal glucose 10/11/2015   Rheumatoid arthritis ([place])    hands   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 TAKE WITH A FULL GLASS OF WATER ON AN EMPTY STOMACH AND AVOID RECLINING OR LYING DOWN FOR 2 HOURS AFTER TAKING THIS MEDICATION. 12 tablet 3   aspirin EC 81 MG tablet Take 81 mg daily by mouth.     azithromycin (ZITHROMAX) 500 MG tablet Take 1 tab (500 mg) tab daily for 10 days 10 tablet 0   baclofen (LIORESAL) 10 MG tablet TAKE 1 /2 TO 1 TABLET 2 X /DAY IF NEEDED FOR MUSCLE SPASM 100 tablet 1   busPIRone (BUSPAR) 7.5 MG tablet Take 7.5 mg by mouth 3 (three) times daily.     Cholecalciferol (VITAMIN D-3) 5000 UNITS TABS Take 5,000 Units by mouth 2 (two) times daily.      DULoxetine (CYMBALTA) 30 MG capsule TAKE 1 CAPSULE BY MOUTH DAILY FOR MOOD AND CHRONIC PAIN. 90 capsule 0   escitalopram (LEXAPRO) 20 MG tablet Take 20 mg by mouth daily.  ezetimibe (ZETIA) 10 MG tablet TAKE 1 TABLET BY MOUTH EVERY DAY 90 tablet 3   famciclovir (FAMVIR) 500 MG tablet TAKE 1 TABLET BY MOUTH EVERY DAY 90 tablet 3   famotidine (PEPCID) 40 MG tablet Take 1 tabet Daily for Heartburn and Indigestion 90 tablet 3   folic acid (FOLVITE) 1 MG tablet TAKE 1 TABLET DAILY FOR FOLATE DEFICIENCY 90 tablet 3   ibrutinib (IMBRUVICA) 280 MG tablet TAKE 1 TABLET (280 MG) BY MOUTH DAILY. 28 tablet 3   ipratropium (ATROVENT) 0.06 % nasal spray Use 1 to 2 sprays each nostril 2 to 3 x /day as needed 45 mL 3   levocetirizine (XYZAL) 5 MG tablet Take  1 tablet  Daily  for Allergies 90 tablet 3   olmesartan (BENICAR) 40 MG tablet Take 1 tablet (40 mg total) by mouth at bedtime. For BP 90 tablet 3   promethazine-dextromethorphan (PROMETHAZINE-DM)  6.25-15 MG/5ML syrup Take 5 mLs by mouth 4 (four) times daily as needed for cough. 240 mL 0   rosuvastatin (CRESTOR) 40 MG tablet TAKE 1 TABLET BY MOUTH EVERY DAY FOR CHOLESTEROL 90 tablet 3   triamcinolone cream (KENALOG) 0.1 % Apply 1 application topically 2 (two) times daily. 80 g 0   vitamin B-12 (CYANOCOBALAMIN) 500 MCG tablet Take 500 mcg by mouth daily.     No current facility-administered medications for this visit.    Allergies:   Adalimumab   Social History:  The patient  reports that he quit smoking about 12 years ago. His smoking use included cigarettes. He smoked an average of 2 packs per day. He has never used smokeless tobacco. He reports that he does not drink alcohol and does not use drugs.   Family History:  The patient's family history includes Breast cancer in his mother; COPD in his maternal grandfather and mother; Colon cancer (age of onset: 25) in his maternal grandfather; Colon polyps in his brother and mother; Colon polyps (age of onset: 23) in his daughter; Colon polyps (age of onset: 29) in his maternal grandfather; Heart failure in his mother; Hyperlipidemia in his brother and mother; Hypertension in his father; Rectal cancer in his mother; Stroke in his maternal grandfather.   ROS:  Please see the history of present illness.   Otherwise, review of systems is positive for none.   All other systems are reviewed and negative.   PHYSICAL EXAM: VS:  BP 130/72   Pulse 66   Ht '5\' 9"'$  (1.753 m)   Wt 164 lb 12.8 oz (74.8 kg)   BMI 24.34 kg/m  , BMI Body mass index is 24.34 kg/m. GEN: Well nourished, well developed, in no acute distress  HEENT: normal  Neck: no JVD, carotid bruits, or masses Cardiac: RRR; no murmurs, rubs, or gallops,no edema  Respiratory:  clear to auscultation bilaterally, normal work of breathing GI: soft, nontender, nondistended, + BS MS: no deformity or atrophy  Skin: warm and dry, device site well healed Neuro:  Strength and sensation are  intact Psych: euthymic mood, full affect  EKG:  EKG is ordered today. Personal review of the ekg ordered shows sinus rhythm  Personal review of the device interrogation today. Results in [place]: 03/20/2022: Magnesium 2.2 08/10/2022: ALT 11; BUN 15; Creatinine 1.30; Hemoglobin 14.1; Platelet Count 221; Potassium 4.0; Sodium 138; TSH 2.137    Lipid Panel     Component Value Date/Time   CHOL 129 03/20/2022 1125   CHOL 118 10/06/2020 0827  TRIG 55 03/20/2022 1125   HDL 51 03/20/2022 1125   HDL 41 10/06/2020 0827   CHOLHDL 2.5 03/20/2022 1125   VLDL 12 04/16/2017 1712   LDLCALC 64 03/20/2022 1125     Wt Readings from Last 3 Encounters:  09/24/22 164 lb 12.8 oz (74.8 kg)  08/20/22 165 lb (74.8 kg)  08/10/22 165 lb (74.8 kg)      Other studies Reviewed: Additional studies/ records that were reviewed today include: TTE 07/27/20  Review of the above records today demonstrates:   1. Left ventricular ejection fraction, by estimation, is 55 to 60%. The  left ventricle has normal function. The left ventricle has no regional  wall motion abnormalities. Left ventricular diastolic parameters were  normal.   2. Right ventricular systolic function is normal. The right ventricular  size is normal.   3. The mitral valve is normal in structure. No evidence of mitral valve  regurgitation. No evidence of mitral stenosis.   4. The aortic valve is normal in structure. Aortic valve regurgitation is  mild. Mild aortic valve sclerosis is present, with no evidence of aortic  valve stenosis.   5. The inferior vena cava is normal in size with greater than 50%  respiratory variability, suggesting right atrial pressure of 3 mmHg.   Monitor 07/10/20 personally reviewed 1. SR/SB/ST 2. Occasional PVCs/PACs 3. Short runs of SVT  ASSESSMENT AND PLAN:  1.  Sick sinus syndrome: Status post [employer] dual-chamber pacemaker implanted 08/10/2020.  Device functioning appropriately.  No  changes.  2.  Hypertension: Currently well controlled  3.  Hyperlipidemia: Continue Crestor per primary care  4.  Paroxysmal short episodes.  No need for anticoagulation.  Asymptomatic.   Current medicines are reviewed at length with the patient today.   The patient does not have concerns regarding his medicines.  The following changes were made today: None  Labs/ tests ordered today include:  Orders Placed This Encounter  Procedures   EKG 12-Lead      Disposition:   FU 12 months  Signed, Naureen Benton Meredith Leeds, MD  09/24/2022 2:01 PM     [place]  [id number] [phone] (office) [phone] (fax)

## 2022-09-26 ENCOUNTER — Ambulatory Visit (INDEPENDENT_AMBULATORY_CARE_PROVIDER_SITE_OTHER): Payer: Medicare HMO | Admitting: Internal Medicine

## 2022-09-26 ENCOUNTER — Encounter: Payer: Self-pay | Admitting: Internal Medicine

## 2022-09-26 VITALS — BP 140/80 | HR 66 | Temp 97.9°F | Resp 17 | Ht 69.0 in | Wt 170.2 lb

## 2022-09-26 DIAGNOSIS — Z23 Encounter for immunization: Secondary | ICD-10-CM | POA: Diagnosis not present

## 2022-09-26 DIAGNOSIS — C8800 Waldenstrom macroglobulinemia not having achieved remission: Secondary | ICD-10-CM

## 2022-09-26 DIAGNOSIS — Z1211 Encounter for screening for malignant neoplasm of colon: Secondary | ICD-10-CM

## 2022-09-26 DIAGNOSIS — R0989 Other specified symptoms and signs involving the circulatory and respiratory systems: Secondary | ICD-10-CM

## 2022-09-26 DIAGNOSIS — Z950 Presence of cardiac pacemaker: Secondary | ICD-10-CM

## 2022-09-26 DIAGNOSIS — E782 Mixed hyperlipidemia: Secondary | ICD-10-CM

## 2022-09-26 DIAGNOSIS — I700 Atherosclerosis of aorta: Secondary | ICD-10-CM | POA: Diagnosis not present

## 2022-09-26 DIAGNOSIS — Z0001 Encounter for general adult medical examination with abnormal findings: Secondary | ICD-10-CM

## 2022-09-26 DIAGNOSIS — Z125 Encounter for screening for malignant neoplasm of prostate: Secondary | ICD-10-CM

## 2022-09-26 DIAGNOSIS — Z0000 Encounter for general adult medical examination without abnormal findings: Secondary | ICD-10-CM | POA: Diagnosis not present

## 2022-09-26 DIAGNOSIS — Z136 Encounter for screening for cardiovascular disorders: Secondary | ICD-10-CM

## 2022-09-26 DIAGNOSIS — M810 Age-related osteoporosis without current pathological fracture: Secondary | ICD-10-CM

## 2022-09-26 DIAGNOSIS — R7309 Other abnormal glucose: Secondary | ICD-10-CM

## 2022-09-26 DIAGNOSIS — Z8249 Family history of ischemic heart disease and other diseases of the circulatory system: Secondary | ICD-10-CM

## 2022-09-26 DIAGNOSIS — E559 Vitamin D deficiency, unspecified: Secondary | ICD-10-CM

## 2022-09-26 DIAGNOSIS — N138 Other obstructive and reflux uropathy: Secondary | ICD-10-CM

## 2022-09-26 DIAGNOSIS — Z87891 Personal history of nicotine dependence: Secondary | ICD-10-CM

## 2022-09-26 DIAGNOSIS — Z79899 Other long term (current) drug therapy: Secondary | ICD-10-CM

## 2022-09-26 NOTE — Patient Instructions (Signed)
Due to recent changes in healthcare laws, you may see the results of your imaging and laboratory studies on MyChart before your provider has had a chance to review them.  We understand that in some cases there may be results that are confusing or concerning to you. Not all laboratory results come back in the same time frame and the provider may be waiting for multiple results in order to interpret others.  Please give us 48 hours in order for your provider to thoroughly review all the results before contacting the office for clarification of your results.  ° °++++++++++++++++++++++++++++++++++++++ ° Vit D  & °Vit C 1,000 mg   °are recommended to help protect  °against the Covid-19 and other Corona viruses.  ° ° Also it's recommended  °to take  °Zinc 50 mg  °to help  °protect against the Covid-19   °and best place to get ° is also on [employer]  °and don't pay more than 6-8 cents /pill !  °=============================== °Coronavirus (COVID-19) Are you at risk? ° °Are you at risk for the Coronavirus (COVID-19)? ° °To be considered HIGH RISK for Coronavirus (COVID-19), you have to meet the following criteria: ° °Traveled to [place], [place], [place], [place] or [place]; or in the [place] to [place], [place], [place]  °or [place]; and have fever, cough, and shortness of breath within the last 2 weeks of travel OR °Been in close contact with a person diagnosed with COVID-19 within the last 2 weeks and have  °fever, cough,and shortness of breath ° °IF YOU DO NOT MEET THESE CRITERIA, YOU ARE CONSIDERED LOW RISK FOR COVID-19. ° °What to do if you are HIGH RISK for COVID-19? ° °If you are having a medical emergency, call 911. °Seek medical care right away. Before you go to a doctor’s office, urgent care or emergency department, ° call ahead and tell them about your recent travel, contact with someone diagnosed with COVID-19  ° and your symptoms.  °You should receive instructions from your physician’s office  regarding next steps of care.  °When you arrive at healthcare provider, tell the healthcare staff immediately you have returned from  °visiting [place], [place], [place], [place] or [place]; or traveled in the [place] to [place], [place],  °[place] or [place] in the last two weeks or you have been in close contact with a person diagnosed with  °COVID-19 in the last 2 weeks.   °Tell the health care staff about your symptoms: fever, cough and shortness of breath. °After you have been seen by a medical provider, you will be either: °Tested for (COVID-19) and discharged home on quarantine except to seek medical care if  °symptoms worsen, and asked to  °Stay home and avoid contact with others until you get your results (4-5 days)  °Avoid travel on public transportation if possible (such as bus, train, or airplane) or °Sent to the Emergency Department by EMS for evaluation, COVID-19 testing  and  °possible admission depending on your condition and test results. ° °What to do if you are LOW RISK for COVID-19? ° °Reduce your risk of any infection by using the same precautions used for avoiding the common cold or flu:  °Wash your hands often with soap and warm water for at least 20 seconds.  If soap and water are not readily available,  °use an alcohol-based hand sanitizer with at least 60% alcohol.  °If coughing or sneezing, cover your mouth and nose by coughing   or sneezing into the elbow areas of your shirt or coat, ° into a tissue or into your sleeve (not your hands). °Avoid shaking hands with others and consider head nods or verbal greetings only. °Avoid touching your eyes, nose, or mouth with unwashed hands.  °Avoid close contact with people who are sick. °Avoid places or events with large numbers of people in one location, like concerts or sporting events. °Carefully consider travel plans you have or are making. °If you are planning any travel outside or inside the [place], visit the CDC’s Travelers’ Health  webpage for the latest health notices. °If you have some symptoms but not all symptoms, continue to monitor at home and seek medical attention  °if your symptoms worsen. °If you are having a medical emergency, call 911. °>>>>>>>>>>>>>>>>>>>>>>>>>>>> °Preventive Care for Adults ° °A healthy lifestyle and preventive care can promote health and wellness. Preventive health guidelines for men include the following key practices: °A routine yearly physical is a good way to check with your health care provider about your health and preventative screening. It is a chance to share any concerns and updates on your health and to receive a thorough exam. °Visit your dentist for a routine exam and preventative care every 6 months. Brush your teeth twice a day and floss once a day. Good oral hygiene prevents tooth decay and gum disease. °The frequency of eye exams is based on your age, health, family medical history, use of contact lenses, and other factors. Follow your health care provider's recommendations for frequency of eye exams. °Eat a healthy diet. Foods such as vegetables, fruits, whole grains, low-fat dairy products, and lean protein foods contain the nutrients you need without too many calories. Decrease your intake of foods high in solid fats, added sugars, and salt. Eat the right amount of calories for you. Get information about a proper diet from your health care provider, if necessary. °Regular physical exercise is one of the most important things you can do for your health. Most adults should get at least 150 minutes of moderate-intensity exercise (any activity that increases your heart rate and causes you to sweat) each week. In addition, most adults need muscle-strengthening exercises on 2 or more days a week. °Maintain a healthy weight. The body mass index (BMI) is a screening tool to identify possible weight problems. It provides an estimate of body fat based on height and weight. Your health care provider can  find your BMI and can help you achieve or maintain a healthy weight. For adults 20 years and older: °A BMI below 18.5 is considered underweight. °A BMI of 18.5 to 24.9 is normal. °A BMI of 25 to 29.9 is considered overweight. °A BMI of 30 and above is considered obese. °Maintain normal blood lipids and cholesterol levels by exercising and minimizing your intake of saturated fat. Eat a balanced diet with plenty of fruit and vegetables. Blood tests for lipids and cholesterol should begin at age 20 and be repeated every 5 years. If your lipid or cholesterol levels are high, you are over 50, or you are at high risk for heart disease, you may need your cholesterol levels checked more frequently. Ongoing high lipid and cholesterol levels should be treated with medicines if diet and exercise are not working. °If you smoke, find out from your health care provider how to quit. If you do not use tobacco, do not start. °Lung cancer screening is recommended for adults aged 55-80 years who are at high risk for   developing lung cancer because of a history of smoking. A yearly low-dose CT scan of the lungs is recommended for people who have at least a 30-pack-year history of smoking and are a current smoker or have quit within the past 15 years. A pack year of smoking is smoking an average of 1 pack of cigarettes a day for 1 year (for example: 1 pack a day for 30 years or 2 packs a day for 15 years). Yearly screening should continue until the smoker has stopped smoking for at least 15 years. Yearly screening should be stopped for people who develop a health problem that would prevent them from having lung cancer treatment. °If you choose to drink alcohol, do not have more than 2 drinks per day. One drink is considered to be 12 ounces (355 mL) of beer, 5 ounces (148 mL) of wine, or 1.5 ounces (44 mL) of liquor. °Avoid use of street drugs. Do not share needles with anyone. Ask for help if you need support or instructions about  stopping the use of drugs. °High blood pressure causes heart disease and increases the risk of stroke. Your blood pressure should be checked at least every 1-2 years. Ongoing high blood pressure should be treated with medicines, if weight loss and exercise are not effective. °If you are 45-79 years old, ask your health care provider if you should take aspirin to prevent heart disease. °Diabetes screening involves taking a blood sample to check your fasting blood sugar level. This should be done once every 3 years, after age 45, if you are within normal weight and without risk factors for diabetes. Testing should be considered at a younger age or be carried out more frequently if you are overweight and have at least 1 risk factor for diabetes. °Colorectal cancer can be detected and often prevented. Most routine colorectal cancer screening begins at the age of 50 and continues through age 75. However, your health care provider may recommend screening at an earlier age if you have risk factors for colon cancer. On a yearly basis, your health care provider may provide home test kits to check for hidden blood in the stool. Use of a small camera at the end of a tube to directly examine the colon (sigmoidoscopy or colonoscopy) can detect the earliest forms of colorectal cancer. Talk to your health care provider about this at age 50, when routine screening begins. Direct exam of the colon should be repeated every 5-10 years through age 75, unless early forms of precancerous polyps or small growths are found. ° Talk with your health care provider about prostate cancer screening. °Testicular cancer screening isrecommended for adult males. Screening includes self-exam, a health care provider exam, and other screening tests. Consult with your health care provider about any symptoms you have or any concerns you have about testicular cancer. °Use sunscreen. Apply sunscreen liberally and repeatedly throughout the day. You should  seek shade when your shadow is shorter than you. Protect yourself by wearing long sleeves, pants, a wide-brimmed hat, and sunglasses year round, whenever you are outdoors. °Once a month, do a whole-body skin exam, using a mirror to look at the skin on your back. Tell your health care provider about new moles, moles that have irregular borders, moles that are larger than a pencil eraser, or moles that have changed in shape or color. °Stay current with required vaccines (immunizations). °Influenza vaccine. All adults should be immunized every year. °Tetanus, diphtheria, and acellular pertussis (Td, Tdap) vaccine. An   adult who has not previously received Tdap or who does not know his vaccine status should receive 1 dose of Tdap. This initial dose should be followed by tetanus and diphtheria toxoids (Td) booster doses every 10 years. Adults with an unknown or incomplete history of completing a 3-dose immunization series with Td-containing vaccines should begin or complete a primary immunization series including a Tdap dose. Adults should receive a Td booster every 10 years. °Varicella vaccine. An adult without evidence of immunity to varicella should receive 2 doses or a second dose if he has previously received 1 dose. °Human papillomavirus (HPV) vaccine. Males aged 13-21 years who have not received the vaccine previously should receive the 3-dose series. Males aged 22-26 years may be immunized. Immunization is recommended through the age of 26 years for any male who has sex with males and did not get any or all doses earlier. Immunization is recommended for any person with an immunocompromised condition through the age of 26 years if he did not get any or all doses earlier. During the 3-dose series, the second dose should be obtained 4-8 weeks after the first dose. The third dose should be obtained 24 weeks after the first dose and 16 weeks after the second dose. °Zoster vaccine. One dose is recommended for adults  aged 60 years or older unless certain conditions are present. ° °PREVNAR  - Pneumococcal 13-valent conjugate (PCV13) vaccine. When indicated, a person who is uncertain of his immunization history and has no record of immunization should receive the PCV13 vaccine. An adult aged 19 years or older who has certain medical conditions and has not been previously immunized should receive 1 dose of PCV13 vaccine. This PCV13 should be followed with a dose of pneumococcal polysaccharide (PPSV23) vaccine. The PPSV23 vaccine dose should be obtained at least 1 r more year(s) after the dose of PCV13 vaccine. An adult aged 19 years or older who has certain medical conditions and previously received 1 or more doses of PPSV23 vaccine should receive 1 dose of PCV13. The PCV13 vaccine dose should be obtained 1 or more years after the last PPSV23 vaccine dose. ° °PNEUMOVAX - Pneumococcal polysaccharide (PPSV23) vaccine. When PCV13 is also indicated, PCV13 should be obtained first. All adults aged 65 years and older should be immunized. An adult younger than age 65 years who has certain medical conditions should be immunized. Any person who resides in a nursing home or long-term care facility should be immunized. An adult smoker should be immunized. People with an immunocompromised condition and certain other conditions should receive both PCV13 and PPSV23 vaccines. People with human immunodeficiency virus (HIV) infection should be immunized as soon as possible after diagnosis. Immunization during chemotherapy or radiation therapy should be avoided. Routine use of PPSV23 vaccine is not recommended for American Indians, [place] Natives, or people younger than 65 years unless there are medical conditions that require PPSV23 vaccine. When indicated, people who have unknown immunization and have no record of immunization should receive PPSV23 vaccine. One-time revaccination 5 years after the first dose of PPSV23 is recommended for people  aged 19-64 years who have chronic kidney failure, nephrotic syndrome, asplenia, or immunocompromised conditions. People who received 1-2 doses of PPSV23 before age 65 years should receive another dose of PPSV23 vaccine at age 65 years or later if at least 5 years have passed since the previous dose. Doses of PPSV23 are not needed for people immunized with PPSV23 at or after age 65 years. ° °Hepatitis A vaccine.   Adults who wish to be protected from this disease, have certain high-risk conditions, work with hepatitis A-infected animals, work in hepatitis A research labs, or travel to or work in countries with a high rate of hepatitis A should be immunized. Adults who were previously unvaccinated and who anticipate close contact with an international adoptee during the first 60 days after arrival in the [place] from a country with a high rate of hepatitis A should be immunized. ° °Hepatitis B vaccine. Adults should be immunized if they wish to be protected from this disease, have certain high-risk conditions, may be exposed to blood or other infectious body fluids, are household contacts or sex partners of hepatitis B positive people, are clients or workers in certain care facilities, or travel to or work in countries with a high rate of hepatitis B. ° °Preventive Service / Frequency ° °Ages 40 to 64 °Blood pressure check. °Lipid and cholesterol check °Lung cancer screening. / Every year if you are aged 55-80 years and have a 30-pack-year history of smoking and currently smoke or have quit within the past 15 years. Yearly screening is stopped once you have quit smoking for at least 15 years or develop a health problem that would prevent you from having lung cancer treatment. °Fecal occult blood test (FOBT) of stool. / Every year beginning at age 50 and continuing until age 75. You may not have to do this test if you get a colonoscopy every 10 years. °Flexible sigmoidoscopy** or colonoscopy.** / Every 5 years for  a flexible sigmoidoscopy or every 10 years for a colonoscopy beginning at age 50 and continuing until age 75. °Screening for abdominal aortic aneurysm (AAA)  by ultrasound is recommended for people who have history of high blood pressure or who are current or former smokers. °+++++++++++ °Recommend Adult Low Dose Aspirin or  °coated  Aspirin 81 mg daily  °To reduce risk of Colon Cancer 40 %, ° Skin Cancer 26 % ,  °Malignant Melanoma 46% ° and  °Pancreatic cancer 60% °++++++++++++++++++++ °Vitamin D goal ° is between 70-100.  °Please make sure that you are taking your Vitamin D as directed.  °It is very important as a natural anti-inflammatory  °helping hair, skin, and nails, as well as reducing stroke and heart attack risk.  °It helps your bones and helps with mood. °It also decreases numerous cancer risks so please take it as directed.  °Low Vit D is associated with a 200-300% higher risk for CANCER  °and 200-300% higher risk for HEART   ATTACK  &  STROKE.   °...................................... °It is also associated with higher death rate at younger ages,  °autoimmune diseases like Rheumatoid arthritis, Lupus, Multiple Sclerosis.    °Also many other serious conditions, like depression, Alzheimer's °Dementia, infertility, muscle aches, fatigue, fibromyalgia - just to name a few. °+++++++++++++++++++++ °Recommend the book "The END of DIETING" by Dr Joel Fuhrman  °& the book "The END of DIABETES " by Dr Joel Fuhrman °At [employer] - get book & Audio CD's  °  Being diabetic has a  300% increased risk for heart attack, stroke, cancer, and alzheimer- type vascular dementia. It is very important that you work harder with diet by avoiding all foods that are white. Avoid white rice (Cheeks & wild rice is OK), white potatoes (sweetpotatoes in moderation is OK), White bread or wheat bread or anything made out of white flour like bagels, donuts, rolls, buns, biscuits, cakes, pastries, cookies, pizza crust, and pasta (made    from white flour & egg whites) - vegetarian pasta or spinach or wheat pasta is OK. Multigrain breads like Arnold's or Pepper[employer], or multigrain sandwich thins or flatbreads.  Diet, exercise and weight loss can reverse and cure diabetes in the early stages.  Diet, exercise and weight loss is very important in the control and prevention of complications of diabetes which affects every system in your body, ie. Brain - dementia/stroke, eyes - glaucoma/blindness, heart - heart attack/heart failure, kidneys - dialysis, stomach - gastric paralysis, intestines - malabsorption, nerves - severe painful neuritis, circulation - gangrene & loss of a leg(s), and finally cancer and Alzheimers. ° °  I recommend avoid fried & greasy foods,  sweets/candy, white rice (Mccranie or wild rice or Quinoa is OK), white potatoes (sweet potatoes are OK) - anything made from white flour - bagels, doughnuts, rolls, buns, biscuits,white and wheat breads, pizza crust and traditional pasta made of white flour & egg white(vegetarian pasta or spinach or wheat pasta is OK).  Multi-grain bread is OK - like multi-grain flat bread or sandwich thins. Avoid alcohol in excess. Exercise is also important. ° °  Eat all the vegetables you want - avoid meat, especially red meat and dairy - especially cheese.  Cheese is the most concentrated form of trans-fats which is the worst thing to clog up our arteries. Veggie cheese is OK which can be found in the fresh produce section at [employer] or [employer] or [employer] ° °++++++++++++++++++++++ °DASH Eating Plan ° °DASH stands for "Dietary Approaches to Stop Hypertension."  ° °The DASH eating plan is a healthy eating plan that has been shown to reduce high blood pressure (hypertension). Additional health benefits may include reducing the risk of type 2 diabetes mellitus, heart disease, and stroke. The DASH eating plan may also help with weight loss. °WHAT DO I NEED TO KNOW ABOUT THE DASH EATING PLAN? °For  the DASH eating plan, you will follow these general guidelines: °Choose foods with a percent daily value for sodium of less than 5% (as listed on the food label). °Use salt-free seasonings or herbs instead of table salt or sea salt. °Check with your health care provider or pharmacist before using salt substitutes. °Eat lower-sodium products, often labeled as "lower sodium" or "no salt added." °Eat fresh foods. °Eat more vegetables, fruits, and low-fat dairy products. °Choose whole grains. Look for the word "whole" as the first word in the ingredient list. °Choose fish  °Limit sweets, desserts, sugars, and sugary drinks. °Choose heart-healthy fats. °Eat veggie cheese  °Eat more home-cooked food and less restaurant, buffet, and fast food. °Limit fried foods. °Cook foods using methods other than frying. °Limit canned vegetables. If you do use them, rinse them well to decrease the sodium. °When eating at a restaurant, ask that your food be prepared with less salt, or no salt if possible. °                  °   WHAT FOODS CAN I EAT? °Read Dr Joel Fuhrman's books on The End of Dieting & The End of Diabetes ° °Grains °Whole grain or whole wheat bread. Tolan rice. Whole grain or whole wheat pasta. Quinoa, bulgur, and whole grain cereals. Low-sodium cereals. Corn or whole wheat flour tortillas. Whole grain cornbread. Whole grain crackers. Low-sodium crackers. ° °Vegetables °Fresh or frozen vegetables (raw, steamed, roasted, or grilled). Low-sodium or reduced-sodium tomato and vegetable juices. Low-sodium or reduced-sodium tomato sauce and paste. Low-sodium or reduced-sodium canned vegetables.  ° °  Fruits °All fresh, canned (in natural juice), or frozen fruits. ° °Protein Products ° All fish and seafood.  Dried beans, peas, or lentils. Unsalted nuts and seeds. Unsalted canned beans. ° °Dairy °Low-fat dairy products, such as skim or 1% milk, 2% or reduced-fat cheeses, low-fat ricotta or cottage cheese, or plain low-fat yogurt.  Low-sodium or reduced-sodium cheeses. ° °Fats and Oils °Tub margarines without trans fats. Light or reduced-fat mayonnaise and salad dressings (reduced sodium). Avocado. Safflower, olive, or canola oils. Natural peanut or almond butter. ° °Other °Unsalted popcorn and pretzels. °The items listed above may not be a complete list of recommended foods or beverages. Contact your dietitian for more options. ° °+++++++++++++++++++ ° °WHAT FOODS ARE NOT RECOMMENDED? °Grains/ White flour or wheat flour °White bread. White pasta. White rice. Refined cornbread. Bagels and croissants. Crackers that contain trans fat. ° °Vegetables ° °Creamed or fried vegetables. Vegetables in a . Regular canned vegetables. Regular canned tomato sauce and paste. Regular tomato and vegetable juices. ° °Fruits °Dried fruits. Canned fruit in light or heavy syrup. Fruit juice. ° °Meat and Other Protein Products °Meat in general - RED meat & White meat.  Fatty cuts of meat. Ribs, chicken wings, all processed meats as bacon, sausage, bologna, salami, fatback, hot dogs, bratwurst and packaged luncheon meats. ° °Dairy °Whole or 2% milk, cream, half-and-half, and cream cheese. Whole-fat or sweetened yogurt. Full-fat cheeses or blue cheese. Non-dairy creamers and whipped toppings. Processed cheese, cheese spreads, or cheese curds. ° °Condiments °Onion and garlic salt, seasoned salt, table salt, and sea salt. Canned and packaged gravies. Worcestershire sauce. Tartar sauce. Barbecue sauce. Teriyaki sauce. Soy sauce, including reduced sodium. Steak sauce. Fish sauce. Oyster sauce. Cocktail sauce. Horseradish. Ketchup and mustard. Meat flavorings and tenderizers. Bouillon cubes. Hot sauce. Tabasco sauce. Marinades. Taco seasonings. Relishes. ° °Fats and Oils °Butter, stick margarine, lard, shortening and bacon fat. Coconut, palm kernel, or palm oils. Regular salad dressings. ° °Pickles and olives. Salted popcorn and pretzels. ° °The items listed above may not  be a complete list of foods and beverages to avoid. ° ° °

## 2022-09-26 NOTE — Progress Notes (Signed)
Annual  Screening/Preventative Visit  & Comprehensive Evaluation & Examination   Future Appointments  Date Time Provider Department  09/26/2022                           cpe  2:00 PM Unk Pinto, MD [facility]  10/15/2022 10:00 AM [name], MD [facility]  12/06/2022                           wellness 11:00 AM Darrol Jump, NP [facility]  10/02/2023                           cpe  2:00 PM Unk Pinto, MD [facility]             This very nice 61 y.o. MWM presents for a Screening /Preventative Visit & comprehensive evaluation and management of multiple medical co-morbidities.  Patient has been followed for HTN, HLD, Prediabetes and Vitamin D Deficiency. Patient follows with Dr Marin Olp for [place] (May 2019) .  Patient relates that he has been on SS Disability since 2020. Patient is on Alendronate for a thoracic Compression fx due to Osteoporosis.  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 Patient has GERD controlled on his meds. Patient also has hx/o a major Depressive Disorder in remission & controlled.        Labile HTN predates circa 2013. Patient's BP has been controlled at home.  Today's BP is at goal - 140/80 . 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  Patient denies any cardiac symptoms as chest pain, palpitations, shortness of breath, dizziness or ankle swelling.       Patient's hyperlipidemia is controlled with diet and Rosuvastatin/Ezetimibe. Patient denies myalgias or other medication SE's. Last lipids were not at goal:  Lab Results  Component Value Date   CHOL 129 03/20/2022   HDL 51 03/20/2022   LDLCALC 64 03/20/2022   TRIG 55 03/20/2022   CHOLHDL 2.5 03/20/2022         Patient is monitored expectantly for glucose  intolerance  and patient  denies reactive hypoglycemic symptoms, visual blurring, diabetic polys or paresthesias. Last A1c was normal & at goal:   Lab Results  Component Value Date   HGBA1C 5.3 09/18/2021         Finally, patient has history of Vitamin D Deficiency ("32" /2017) and last vitamin D was at goal (70-100):   Lab Results  Component Value Date   VD25OH 65 09/18/2021       Current Outpatient Medications  Medication Instructions   alendronate (FOSAMAX)   70 mg Every 7 days   aspirin EC   81 mg Daily   baclofen (LIORESAL) 10 MG tablet TAKE 1 /2 TO 1 TABLET 2 X /DAY IF NEEDED    busPIRone (BUSPAR  7.5 mg 3 times daily   cyanocobalamin (VITAMIN B12)  500 mcg Daily   DULoxetine (CYMBALTA) 30 MG capsule TAKE 1 CAPSULE DAILY   escitalopram (LEXAPRO)  20 mg  Daily   ezetimibe (ZETIA) 10 MG tablet TAKE 1 TABLET  EVERY DAY   famciclovir (FAMVIR) 500 MG tablet TAKE 1 TABLET  EVERY  DAY   famotidine (PEPCID) 40 MG tablet Take 1 tabet Daily for Heartburn   folic acid (FOLVITE) 1 MG tablet TAKE 1 TABLET DAILY    ibrutinib (IMBRUVICA) 280 MG tablet TAKE 1 TABLET  DAILY.   ipratropium (ATROVENT) 0.06 % nasal spray Use 1 to 2 sprays each nostril 2 to 3 x /day as needed   levocetirizine (XYZAL) 5 MG tablet Take  1 tablet  Daily  for Allergies   olmesartan (BENICAR)  40 mg Daily at bedtime   promethazine-DM 6.25-15 MG/5ML syrup 5 mLs, Oral, 4 times daily PRN   rosuvastatin (CRESTOR) 40 MG tablet TAKE 1 TABLET EVERY DAY   triamcinolone cream (KENALOG) 0.1 % 1 application  2 times daily         Allergies  Allergen Reactions   Adalimumab Other (See Comments)    "blacked out", confused      Past Medical History:  Diagnosis Date   Allergy    seasonal allergies   Anemia    Anxiety    on meds   Arthritis    Chest pain, non-cardiac    COPD ([place])    past smoker   Counseling regarding goals of care 03/27/2018   Depression    on meds   Diverticulitis    Diverticulosis    Fractured pelvis ([place])     fall from ladder   GERD     on meds   Gout    History of shingles    Hyperlipidemia    on meds   Hypertension    on meds   Internal hemorrhoids    Iron deficiency anemia 03/13/2018   Kidney stones    Osteoporosis    Other abnormal glucose 10/11/2015   Rheumatoid arthritis ([place])    hands   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/27/2018     Health Maintenance  Topic Date Due   Zoster Vaccines- Shingrix (1 of 2) Never done   COVID-19 Vaccine (4 - Booster for Pfizer series) 09/28/2020   TETANUS/TDAP  06/03/2021   INFLUENZA VACCINE  06/26/2021   Pneumococcal Vaccine 53-87 Years old (2 - PCV) 12/06/2021   COLONOSCOPY  02/25/2028   Hepatitis C Screening  Completed   HIV Screening  Completed   HPV VACCINES  Aged Out     Immunization History  Administered Date(s) Administered   DTaP 06/04/2011   Hepatitis B 11/26/2010   Hepatitis B, ped/adol 08/06/2011   Influenza Inj Mdck Quad  09/13/2017, 08/21/2018, 09/01/2020   Influenza Split 09/02/2014   Influenza Whole 09/17/2013   Influenza, Seasonal 10/11/2015   Influenza 09/13/2017   PFIZER SARS-COV-2 Vacc  01/24/2020, 03/06/2020, 08/03/2020   PPD Test 10/11/2015, 08/05/2019, 09/01/2020   Pneumococcal -23 12/06/2020     Last Colon - 02/24/2021 - Dr Hilarie Fredrickson - Polyp - Recc 7 year F/U  - due Apr 2029     Past Surgical History:  Procedure Laterality Date   COLONOSCOPY  2016   JMP-suprep(exc)-mild TICS/int hems/normal-5 yr recall   FINGER SURGERY     iron infusion     KNEE SURGERY     right   PACEMAKER IMPLANT N/A 08/10/2020   Procedure: PACEMAKER IMPLANT;  Surgeon: Constance Haw, MD;  Location: [facility];  Service: Cardiovascular;  Laterality: N/A;   ROTATOR CUFF REPAIR  1996   left   WISDOM TOOTH EXTRACTION       Family History  Problem Relation Age of Onset   Hyperlipidemia Mother    COPD Mother  Heart failure Mother    Colon polyps Mother    Breast cancer Mother    Rectal cancer Mother     Hyperlipidemia Brother    Colon polyps Brother    Stroke Maternal Grandfather    COPD Maternal Grandfather        colon   Colon cancer Maternal Grandfather 27   Colon polyps Maternal Grandfather 42   Hypertension Father    Colon polyps Daughter 76       lynch syndrome   Esophageal cancer Neg Hx    Stomach cancer Neg Hx      Social History   Tobacco Use   Smoking status: Former    Packs/day: 2.00    Types: Cigarettes    Quit date: 07/03/2010    Years since quitting: 11.2   Smokeless tobacco: Never  Vaping Use   Vaping Use: Never used  Substance Use Topics   Alcohol use: No    Alcohol/week: 0.0 standard drinks   Drug use: No      ROS Constitutional: Denies fever, chills, weight loss/gain, headaches, insomnia,  night sweats or change in appetite. Does c/o fatigue. Eyes: Denies redness, blurred vision, diplopia, discharge, itchy or watery eyes.  ENT: Denies discharge, congestion, post nasal drip, epistaxis, sore throat, earache, hearing loss, dental pain, Tinnitus, Vertigo, Sinus pain or snoring.  Cardio: Denies chest pain, palpitations, irregular heartbeat, syncope, dyspnea, diaphoresis, orthopnea, PND, claudication or edema Respiratory: denies cough, dyspnea, DOE, pleurisy, hoarseness, laryngitis or wheezing.  Gastrointestinal: Denies dysphagia, heartburn, reflux, water brash, pain, cramps, nausea, vomiting, bloating, diarrhea, constipation, hematemesis, melena, hematochezia, jaundice or hemorrhoids Genitourinary: Denies dysuria, frequency, urgency, nocturia, hesitancy, discharge, hematuria or flank pain Musculoskeletal: Denies arthralgia, myalgia, stiffness, Jt. Swelling, pain, limp or strain/sprain. Denies Falls. Skin: Denies puritis, rash, hives, warts, acne, eczema or change in skin lesion Neuro: No weakness, tremor, incoordination, spasms, paresthesia or pain Psychiatric: Denies confusion, memory loss or sensory loss. Denies Depression. Endocrine: Denies change in  weight, skin, hair change, nocturia, and paresthesia, diabetic polys, visual blurring or hyper / hypo glycemic episodes.  Heme/Lymph: No excessive bleeding, bruising or enlarged lymph nodes.   Physical Exam  BP (!) 140/80   Pulse 66   Temp 97.9 F (36.6 C)   Resp 17   Ht '5\' 9"'$  (1.753 m)   Wt 170 lb 3.2 oz (77.2 kg)   SpO2 98%   BMI 25.13 kg/m   General Appearance: Well nourished and well groomed and in no apparent distress.  Eyes: PERRLA, EOMs, conjunctiva no swelling or erythema, normal fundi and vessels. Sinuses: No frontal/maxillary tenderness ENT/Mouth: EACs patent / TMs  nl. Nares clear without erythema, swelling, mucoid exudates. Oral hygiene is good. No erythema, swelling, or exudate. Tongue normal, non-obstructing. Tonsils not swollen or erythematous. Hearing normal.  Neck: Supple, thyroid not palpable. No bruits, nodes or JVD. Respiratory: Respiratory effort normal.  BS equal and clear bilateral without rales, rhonci, wheezing or stridor. Cardio: Heart sounds are normal with regular rate and rhythm and no murmurs, rubs or gallops. Peripheral pulses are normal and equal bilaterally without edema. No aortic or femoral bruits. Chest: symmetric with normal excursions and percussion.  Abdomen: Soft, with Nl bowel sounds. Nontender, no guarding, rebound, hernias, masses, or organomegaly.  Lymphatics: Non tender without lymphadenopathy.  Musculoskeletal: Full ROM all peripheral extremities, joint stability, 5/5 strength, and normal gait. Skin: Warm and dry without rashes, lesions, cyanosis, clubbing or  ecchymosis.  Neuro: Cranial nerves intact, reflexes equal bilaterally. Normal muscle tone,  no cerebellar symptoms. Sensation intact.  Pysch: Alert and oriented X 3 with normal affect, insight and judgment appropriate.   Assessment and Plan   1. Encounter for general adult medical examination with abnormal findings   2. Labile hypertension  - EKG 12-Lead - [place], RETROPERITNL  ABD,  LTD - CBC with Differential/Platelet - COMPLETE METABOLIC PANEL WITH GFR - Magnesium - TSH  3. Hyperlipidemia, mixed  - EKG 12-Lead - [place], RETROPERITNL ABD,  LTD - Lipid panel - TSH  4. Abnormal glucose  - EKG 12-Lead - [place], RETROPERITNL ABD,  LTD - Hemoglobin A1c - Insulin, random  5. Vitamin D deficiency  - VITAMIN D 25 Hydroxy   6. Presence of permanent cardiac pacemaker  - EKG 12-Lead  7. 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 BPH with obstruction/lower urinary tract symptoms  - PSA  9. Osteoporosis    10. Screening for colorectal cancer  - POC Hemoccult Bld/Stl   11. Prostate cancer screening  - PSA  12. FHx: heart disease  - EKG 12-Lead  13. Former smoker  - EKG 12-Lead - [place], RETROPERITNL ABD,  LTD  14. Screening for AAA (aortic abdominal aneurysm)  - [place], RETROPERITNL ABD,  LTD  15. Medication management  - Urinalysis, Routine w reflex microscopic - Microalbumin / creatinine urine ratio - CBC with Differential/Platelet - COMPLETE METABOLIC PANEL WITH GFR - Magnesium - Lipid panel - TSH - Hemoglobin A1c - Insulin, random - VITAMIN D 25 Hydroxy              Patient was counseled in prudent diet, weight control to achieve/maintain BMI less than 25, BP monitoring, regular exercise and medications as discussed.  Discussed med effects and SE's. Routine screening labs and tests as requested with regular follow-up as recommended. Over 40 minutes of exam, counseling, chart review and high complex critical decision making was performed   Kirtland Bouchard, MD

## 2022-09-27 LAB — COMPLETE METABOLIC PANEL WITH GFR
AG Ratio: 1.9 (calc) (ref 1.0–2.5)
ALT: 12 U/L (ref 9–46)
AST: 17 U/L (ref 10–35)
Albumin: 4.5 g/dL (ref 3.6–5.1)
Alkaline phosphatase (APISO): 72 U/L (ref 35–144)
BUN: 13 mg/dL (ref 7–25)
CO2: 29 mmol/L (ref 20–32)
Calcium: 9.6 mg/dL (ref 8.6–10.3)
Chloride: 103 mmol/L (ref 98–110)
Creat: 1.21 mg/dL (ref 0.70–1.35)
Globulin: 2.4 g/dL (calc) (ref 1.9–3.7)
Glucose, Bld: 75 mg/dL (ref 65–99)
Potassium: 4.5 mmol/L (ref 3.5–5.3)
Sodium: 140 mmol/L (ref 135–146)
Total Bilirubin: 0.8 mg/dL (ref 0.2–1.2)
Total Protein: 6.9 g/dL (ref 6.1–8.1)
eGFR: 67 mL/min/{1.73_m2} (ref >=60)

## 2022-09-27 LAB — CBC WITH DIFFERENTIAL/PLATELET
Absolute Monocytes: 693 cells/uL (ref 200–950)
Basophils Absolute: 50 cells/uL (ref 0–200)
Basophils Relative: 0.9 %
Eosinophils Absolute: 88 cells/uL (ref 15–500)
Eosinophils Relative: 1.6 %
HCT: 41.4 % (ref 38.5–50.0)
Hemoglobin: 14.4 g/dL (ref 13.2–17.1)
Lymphs Abs: 1447 cells/uL (ref 850–3900)
MCH: 32.7 pg (ref 27.0–33.0)
MCHC: 34.8 g/dL (ref 32.0–36.0)
MCV: 94.1 fL (ref 80.0–100.0)
MPV: 10.9 fL (ref 7.5–12.5)
Monocytes Relative: 12.6 %
Neutro Abs: 3223 cells/uL (ref 1500–7800)
Neutrophils Relative %: 58.6 %
Platelets: 255 10*3/uL (ref 140–400)
RBC: 4.4 10*6/uL (ref 4.20–5.80)
RDW: 13 % (ref 11.0–15.0)
Total Lymphocyte: 26.3 %
WBC: 5.5 10*3/uL (ref 3.8–10.8)

## 2022-09-27 LAB — URINALYSIS, ROUTINE W REFLEX MICROSCOPIC
Bacteria, UA: NONE SEEN /HPF
Bilirubin Urine: NEGATIVE
Glucose, UA: NEGATIVE
Hyaline Cast: NONE SEEN /LPF
Ketones, ur: NEGATIVE
Leukocytes,Ua: NEGATIVE
Nitrite: NEGATIVE
Protein, ur: NEGATIVE
RBC / HPF: NONE SEEN /HPF (ref 0–2)
Specific Gravity, Urine: 1.007 (ref 1.001–1.035)
Squamous Epithelial / HPF: NONE SEEN /HPF (ref <=5)
WBC, UA: NONE SEEN /HPF (ref 0–5)
pH: 6 (ref 5.0–8.0)

## 2022-09-27 LAB — LIPID PANEL
Cholesterol: 178 mg/dL (ref <200)
HDL: 58 mg/dL (ref >=40)
LDL Cholesterol (Calc): 104 mg/dL (calc) — ABNORMAL HIGH
Non-HDL Cholesterol (Calc): 120 mg/dL (calc) (ref <130)
Total CHOL/HDL Ratio: 3.1 (calc) (ref <5.0)
Triglycerides: 70 mg/dL (ref <150)

## 2022-09-27 LAB — HEMOGLOBIN A1C
Hgb A1c MFr Bld: 5.4 % of total Hgb (ref <5.7)
Mean Plasma Glucose: 108 mg/dL
eAG (mmol/L): 6 mmol/L

## 2022-09-27 LAB — MICROALBUMIN / CREATININE URINE RATIO
Creatinine, Urine: 53 mg/dL (ref 20–320)
Microalb, Ur: <0.2 mg/dL

## 2022-09-27 LAB — PSA: PSA: 0.61 ng/mL (ref <=4.00)

## 2022-09-27 LAB — MICROSCOPIC MESSAGE

## 2022-09-27 LAB — INSULIN, RANDOM: Insulin: 6.1 u[IU]/mL

## 2022-09-27 LAB — MAGNESIUM: Magnesium: 2 mg/dL (ref 1.5–2.5)

## 2022-09-27 LAB — TSH: TSH: 3.1 mIU/L (ref 0.40–4.50)

## 2022-09-27 LAB — VITAMIN D 25 HYDROXY (VIT D DEFICIENCY, FRACTURES): Vit D, 25-Hydroxy: 61 ng/mL (ref 30–100)

## 2022-09-27 NOTE — Progress Notes (Signed)
<><><><><><><><><><><><><><><><><><><><><><><><><><><><><><><><><> <><><><><><><><><><><><><><><><><><><><><><><><><><><><><><><><><> -   Test results slightly outside the reference range are not unusual. If there is anything important, I will review this with you,  otherwise it is considered normal test values.  If you have further questions,  please do not hesitate to contact me at the office or via My Chart.  <><><><><><><><><><><><><><><><><><><><><><><><><><><><><><><><><>  -  Total  Chol =    178    -  Great             (  Ideal  or  Goal is less than 180  !  )  - But, the  -  Bad / Dangerous LDL  Chol =  104   -  slightly elevated              (  Ideal  or  Goal is less than 70  !  )    - Recommend a stricter low cholesterol diet   - Cholesterol only comes from animal sources                                                                                   - ie. meat, dairy, egg yolks  - Eat all the vegetables you want.  - Avoid Meat, Avoid Meat,  Avoid Meat                                                             - especially Red Meat - Beef AND Pork .  - Avoid cheese & dairy - milk & ice cream.     - Cheese is the most concentrated form of trans-fats which  is the worst thing to clog up our arteries.   - Veggie cheese is OK which can be found in the fresh  produce section at [employer] or [employer] or [employer] <><><><><><><><><><><><><><><><><><><><><><><><><><><><><><><><><>  - PSA - Low - Great  ! <><><><><><><><><><><><><><><><><><><><><><><><><><><><><><><><><>  - A1c - Normal - No Diabetes - Great ! <><><><><><><><><><><><><><><><><><><><><><><><><><><><><><><><><>  - Vitamin D = 61 - Great   !  Please continue dose same  <><><><><><><><><><><><><><><><><><><><><><><><><><><><><><><><><>  - All Else - CBC - Kidneys - Electrolytes - Liver - Magnesium & Thyroid    - all  Normal /  OK <><><><><><><><><><><><><><><><><><><><><><><><><><><><><><><><><> <><><><><><><><><><><><><><><><><><><><><><><><><><><><><><><><><>

## 2022-09-30 ENCOUNTER — Encounter: Payer: Self-pay | Admitting: Internal Medicine

## 2022-10-08 ENCOUNTER — Other Ambulatory Visit: Payer: Self-pay | Admitting: Hematology & Oncology

## 2022-10-08 ENCOUNTER — Other Ambulatory Visit (HOSPITAL_COMMUNITY): Payer: Self-pay

## 2022-10-08 DIAGNOSIS — C880 Waldenstrom macroglobulinemia: Secondary | ICD-10-CM

## 2022-10-08 MED ORDER — IBRUTINIB 280 MG PO TABS
ORAL_TABLET | ORAL | 3 refills | Status: AC
  Filled 2022-10-08: qty 28, 28d supply, fill #0
  Filled 2022-11-01: qty 28, 28d supply, fill #1
  Filled 2022-11-27: qty 28, 28d supply, fill #2
  Filled 2022-12-21: qty 28, 28d supply, fill #3

## 2022-10-11 ENCOUNTER — Other Ambulatory Visit (HOSPITAL_COMMUNITY): Payer: Self-pay

## 2022-10-15 ENCOUNTER — Inpatient Hospital Stay: Payer: Medicare HMO | Attending: Hematology & Oncology

## 2022-10-15 ENCOUNTER — Encounter: Payer: Self-pay | Admitting: Hematology & Oncology

## 2022-10-15 ENCOUNTER — Inpatient Hospital Stay (HOSPITAL_BASED_OUTPATIENT_CLINIC_OR_DEPARTMENT_OTHER): Payer: Medicare HMO | Admitting: Hematology & Oncology

## 2022-10-15 ENCOUNTER — Other Ambulatory Visit: Payer: Self-pay

## 2022-10-15 VITALS — BP 177/84 | HR 62 | Temp 97.5°F | Resp 18 | Ht 69.0 in | Wt 170.0 lb

## 2022-10-15 DIAGNOSIS — C880 Waldenstrom macroglobulinemia: Secondary | ICD-10-CM | POA: Insufficient documentation

## 2022-10-15 DIAGNOSIS — D500 Iron deficiency anemia secondary to blood loss (chronic): Secondary | ICD-10-CM

## 2022-10-15 DIAGNOSIS — D509 Iron deficiency anemia, unspecified: Secondary | ICD-10-CM | POA: Diagnosis not present

## 2022-10-15 DIAGNOSIS — Z950 Presence of cardiac pacemaker: Secondary | ICD-10-CM | POA: Insufficient documentation

## 2022-10-15 LAB — CBC WITH DIFFERENTIAL (CANCER CENTER ONLY)
Abs Immature Granulocytes: 0.01 10*3/uL (ref 0.00–0.07)
Basophils Absolute: 0 10*3/uL (ref 0.0–0.1)
Basophils Relative: 1 %
Eosinophils Absolute: 0.1 10*3/uL (ref 0.0–0.5)
Eosinophils Relative: 2 %
HCT: 40.8 % (ref 39.0–52.0)
Hemoglobin: 13.4 g/dL (ref 13.0–17.0)
Immature Granulocytes: 0 %
Lymphocytes Relative: 26 %
Lymphs Abs: 1.1 10*3/uL (ref 0.7–4.0)
MCH: 32.2 pg (ref 26.0–34.0)
MCHC: 32.8 g/dL (ref 30.0–36.0)
MCV: 98.1 fL (ref 80.0–100.0)
Monocytes Absolute: 0.5 10*3/uL (ref 0.1–1.0)
Monocytes Relative: 11 %
Neutro Abs: 2.5 10*3/uL (ref 1.7–7.7)
Neutrophils Relative %: 60 %
Platelet Count: 197 10*3/uL (ref 150–400)
RBC: 4.16 MIL/uL — ABNORMAL LOW (ref 4.22–5.81)
RDW: 13.2 % (ref 11.5–15.5)
Smear Review: NORMAL
WBC Count: 4.1 10*3/uL (ref 4.0–10.5)
nRBC: 0 % (ref 0.0–0.2)

## 2022-10-15 LAB — CMP (CANCER CENTER ONLY)
ALT: 11 U/L (ref 0–44)
AST: 17 U/L (ref 15–41)
Albumin: 4.4 g/dL (ref 3.5–5.0)
Alkaline Phosphatase: 69 U/L (ref 38–126)
Anion gap: 9 (ref 5–15)
BUN: 13 mg/dL (ref 8–23)
CO2: 26 mmol/L (ref 22–32)
Calcium: 9.4 mg/dL (ref 8.9–10.3)
Chloride: 103 mmol/L (ref 98–111)
Creatinine: 1.25 mg/dL — ABNORMAL HIGH (ref 0.61–1.24)
GFR, Estimated: >60 mL/min (ref >60)
Glucose, Bld: 76 mg/dL (ref 70–99)
Potassium: 3.8 mmol/L (ref 3.5–5.1)
Sodium: 138 mmol/L (ref 135–145)
Total Bilirubin: 0.7 mg/dL (ref 0.3–1.2)
Total Protein: 6.8 g/dL (ref 6.5–8.1)

## 2022-10-15 LAB — IRON AND IRON BINDING CAPACITY (CC-WL,HP ONLY)
Iron: 106 ug/dL (ref 45–182)
Saturation Ratios: 31 % (ref 17.9–39.5)
TIBC: 344 ug/dL (ref 250–450)
UIBC: 238 ug/dL (ref 117–376)

## 2022-10-15 LAB — FERRITIN: Ferritin: 60 ng/mL (ref 24–336)

## 2022-10-15 LAB — LACTATE DEHYDROGENASE: LDH: 166 U/L (ref 98–192)

## 2022-10-15 NOTE — Progress Notes (Signed)
Hematology and Oncology Follow Up Visit  Edwin GAGLIO [id number] 11-Jun-1961 61 y.o. 10/15/2022   Principle Diagnosis:  Waldenstrom's macroglobulinemia Iron deficiency anemia  Current Therapy:   Rituxan/bendamustine-s/p cycle 2 -- d/c on 06/11/2018 Imbruvica  280 mg po q day -- changed on 08/06/2019 IV iron as indicated - last received on 08/16/2022      Interim History:  Edwin Webster is here today for follow-up.  His only complaint has been some bright red blood per rectum.  This started recently.  I know that he has seen Gastroenterology.  I will have to let Dr. Hilarie Fredrickson know about this.  Otherwise, he is doing pretty good.  It sounds like he will have a nice Thanksgiving with his family.  He is eating well.  He is having no problems with nausea or vomiting.  Has a pacemaker in place.  He just saw Dr. Curt Bears of Cardiology.  Everything sounded as it was doing well.  As far as his Waldenstrom's goes, his last monoclonal spike was 0.4 g/dL.  The IgM level was 429 mg/dL.  The Kappa light chain was 2.5 mg/dL.  He has had no probably cough or shortness of breath.  His last iron studies that were done back in September showed a ferritin of 20 with iron saturation 15%.  We did go ahead and give him some IV iron with Venofer back on 08/16/2022.  He has had no leg swelling.  He has had no rashes.  There has been no headache.  Overall, I was his performance status is probably ECOG 1.   Medications:  Allergies as of 10/15/2022       Reactions   Adalimumab Other (See Comments)   "blacked out", confused        Medication List        Accurate as of October 15, 2022 10:14 AM. If you have any questions, ask your nurse or doctor.          alendronate 70 MG tablet Commonly known as: FOSAMAX TAKE 1 TABLET (70 MG TOTAL) BY MOUTH EVERY 7 (SEVEN) DAYS. TAKE WITH A FULL GLASS OF WATER ON AN EMPTY STOMACH AND AVOID RECLINING OR LYING DOWN FOR 2 HOURS AFTER TAKING THIS MEDICATION.    aspirin EC 81 MG tablet Take 81 mg daily by mouth.   baclofen 10 MG tablet Commonly known as: LIORESAL TAKE 1 /2 TO 1 TABLET 2 X /DAY IF NEEDED FOR MUSCLE SPASM   busPIRone 7.5 MG tablet Commonly known as: BUSPAR Take 7.5 mg by mouth 3 (three) times daily.   cyanocobalamin 500 MCG tablet Commonly known as: VITAMIN B12 Take 500 mcg by mouth daily.   DULoxetine 30 MG capsule Commonly known as: CYMBALTA TAKE 1 CAPSULE BY MOUTH DAILY FOR MOOD AND CHRONIC PAIN.   escitalopram 20 MG tablet Commonly known as: LEXAPRO Take 20 mg by mouth daily.   ezetimibe 10 MG tablet Commonly known as: ZETIA TAKE 1 TABLET BY MOUTH EVERY DAY   famciclovir 500 MG tablet Commonly known as: FAMVIR TAKE 1 TABLET BY MOUTH EVERY DAY   famotidine 40 MG tablet Commonly known as: PEPCID Take 1 tabet Daily for Heartburn and Indigestion   folic acid 1 MG tablet Commonly known as: FOLVITE TAKE 1 TABLET DAILY FOR FOLATE DEFICIENCY   Imbruvica 280 MG tablet Generic drug: ibrutinib TAKE 1 TABLET (280 MG) BY MOUTH DAILY.   ipratropium 0.06 % nasal spray Commonly known as: ATROVENT Use 1 to 2 sprays each nostril 2  to 3 x /day as needed   levocetirizine 5 MG tablet Commonly known as: XYZAL Take  1 tablet  Daily  for Allergies   olmesartan 40 MG tablet Commonly known as: BENICAR Take 1 tablet (40 mg total) by mouth at bedtime. For BP   promethazine-dextromethorphan 6.25-15 MG/5ML syrup Commonly known as: PROMETHAZINE-DM Take 5 mLs by mouth 4 (four) times daily as needed for cough.   rosuvastatin 40 MG tablet Commonly known as: CRESTOR TAKE 1 TABLET BY MOUTH EVERY DAY FOR CHOLESTEROL   triamcinolone cream 0.1 % Commonly known as: KENALOG Apply 1 application topically 2 (two) times daily.   Vitamin D-3 125 MCG (5000 UT) Tabs Take 5,000 Units by mouth 2 (two) times daily.        Allergies:  Allergies  Allergen Reactions   Adalimumab Other (See Comments)    "blacked out", confused      Past Medical History, Surgical history, Social history, and Family History were reviewed and updated.  Review of Systems: Review of Systems  Constitutional: Negative.   HENT: Negative.    Eyes: Negative.   Respiratory: Negative.    Cardiovascular: Negative.   Gastrointestinal:  Positive for blood in stool.  Genitourinary: Negative.   Musculoskeletal: Negative.   Skin: Negative.   Neurological: Negative.   Endo/Heme/Allergies: Negative.   Psychiatric/Behavioral: Negative.        Physical Exam:   Wt Readings from Last 3 Encounters:  09/26/22 170 lb 3.2 oz (77.2 kg)  09/24/22 164 lb 12.8 oz (74.8 kg)  08/20/22 165 lb (74.8 kg)  Vital signs show a temperature of 97.5.  Pulse 62.  Blood pressure 174/84.  Weight is 170 pounds.  Physical Exam Vitals reviewed.  HENT:     Head: Normocephalic and atraumatic.  Eyes:     Pupils: Pupils are equal, round, and reactive to light.  Cardiovascular:     Rate and Rhythm: Normal rate and regular rhythm.     Heart sounds: Normal heart sounds.  Pulmonary:     Effort: Pulmonary effort is normal.     Breath sounds: Normal breath sounds.  Abdominal:     General: Bowel sounds are normal.     Palpations: Abdomen is soft.  Musculoskeletal:        General: No tenderness or deformity. Normal range of motion.     Cervical back: Normal range of motion.  Lymphadenopathy:     Cervical: No cervical adenopathy.  Skin:    General: Skin is warm and dry.     Findings: No erythema or rash.  Neurological:     Mental Status: He is alert and oriented to person, place, and time.  Psychiatric:        Behavior: Behavior normal.        Thought Content: Thought content normal.        Judgment: Judgment normal.      Lab Results  Component Value Date   WBC 5.5 09/26/2022   HGB 14.4 09/26/2022   HCT 41.4 09/26/2022   MCV 94.1 09/26/2022   PLT 255 09/26/2022   Lab Results  Component Value Date   FERRITIN 20 (L) 08/10/2022   IRON 63  08/10/2022   TIBC 421 08/10/2022   UIBC 358 08/10/2022   IRONPCTSAT 15 (L) 08/10/2022   Lab Results  Component Value Date   RETICCTPCT 1.2 01/11/2022   RBC 4.40 09/26/2022   RETICCTABS 33,120 02/12/2018   Lab Results  Component Value Date   KPAFRELGTCHN 25.2 (H) 08/10/2022  LAMBDASER 11.4 08/10/2022   KAPLAMBRATIO 2.21 (H) 08/10/2022   Lab Results  Component Value Date   IGGSERUM 567 (L) 08/10/2022   IGA 51 (L) 08/10/2022   IGMSERUM 429 (H) 08/10/2022   Lab Results  Component Value Date   TOTALPROTELP 6.4 05/31/2022   ALBUMINELP 3.7 05/31/2022   A1GS 0.3 05/31/2022   A2GS 0.7 05/31/2022   BETS 0.9 05/31/2022   BETA2SER 0.2 02/18/2018   GAMS 0.9 05/31/2022   MSPIKE 0.4 (H) 05/31/2022   SPEI Comment 01/11/2022     Chemistry      Component Value Date/Time   NA 140 09/26/2022 1510   NA 141 08/04/2020 1203   K 4.5 09/26/2022 1510   CL 103 09/26/2022 1510   CO2 29 09/26/2022 1510   BUN 13 09/26/2022 1510   BUN 13 08/04/2020 1203   CREATININE 1.21 09/26/2022 1510      Component Value Date/Time   CALCIUM 9.6 09/26/2022 1510   ALKPHOS 70 08/10/2022 0928   AST 17 09/26/2022 1510   AST 19 08/10/2022 0928   ALT 12 09/26/2022 1510   ALT 11 08/10/2022 0928   BILITOT 0.8 09/26/2022 1510   BILITOT 0.7 08/10/2022 0928      Impression and Plan: Mr. Hardwick is a very pleasant 61 yo caucasian gentleman with Waldenstrom's macroglobulinemia.  He is on single agent Imbruvica.  He is doing well on this from my point of view.  We will check his monoclonal studies and see how they look.  I am not sure what to make of this bright red blood per rectum.  He does see Gastroenterology.  I will have to let them know what is going on.  We will plan to get him back in another couple months.  I would think that his iron studies should be okay given his hemoglobin being so good.   [name], MD 11/20/202310:14 AM

## 2022-10-16 LAB — KAPPA/LAMBDA LIGHT CHAINS
Kappa free light chain: 27 mg/L — ABNORMAL HIGH (ref 3.3–19.4)
Kappa, lambda light chain ratio: 2.13 — ABNORMAL HIGH (ref 0.26–1.65)
Lambda free light chains: 12.7 mg/L (ref 5.7–26.3)

## 2022-10-17 LAB — IGG, IGA, IGM
IgA: 53 mg/dL — ABNORMAL LOW (ref 61–437)
IgG (Immunoglobin G), Serum: 589 mg/dL — ABNORMAL LOW (ref 603–1613)
IgM (Immunoglobulin M), Srm: 423 mg/dL — ABNORMAL HIGH (ref 20–172)

## 2022-10-30 LAB — PROTEIN ELECTROPHORESIS, SERUM, WITH REFLEX
A/G Ratio: 1.1 (ref 0.7–1.7)
Albumin ELP: 3.3 g/dL (ref 2.9–4.4)
Alpha-1-Globulin: 0.3 g/dL (ref 0.0–0.4)
Alpha-2-Globulin: 0.8 g/dL (ref 0.4–1.0)
Beta Globulin: 0.9 g/dL (ref 0.7–1.3)
Gamma Globulin: 1 g/dL (ref 0.4–1.8)
Globulin, Total: 3 g/dL (ref 2.2–3.9)
M-Spike, %: 0.3 g/dL — ABNORMAL HIGH
SPEP Interpretation: 0
Total Protein ELP: 6.3 g/dL (ref 6.0–8.5)

## 2022-10-30 LAB — IMMUNOFIXATION REFLEX, SERUM: IgA: 54 mg/dL — ABNORMAL LOW (ref 61–437)

## 2022-11-01 ENCOUNTER — Other Ambulatory Visit (HOSPITAL_COMMUNITY): Payer: Self-pay

## 2022-11-05 ENCOUNTER — Other Ambulatory Visit: Payer: Self-pay

## 2022-11-08 ENCOUNTER — Ambulatory Visit (INDEPENDENT_AMBULATORY_CARE_PROVIDER_SITE_OTHER): Payer: Medicare HMO

## 2022-11-08 DIAGNOSIS — I495 Sick sinus syndrome: Secondary | ICD-10-CM

## 2022-11-08 LAB — CUP PACEART REMOTE DEVICE CHECK
Battery Remaining Longevity: 105 mo
Battery Remaining Percentage: 83 %
Battery Voltage: 3.01 V
Brady Statistic AP VP Percent: 1 %
Brady Statistic AP VS Percent: 69 %
Brady Statistic AS VP Percent: 1 %
Brady Statistic AS VS Percent: 30 %
Brady Statistic RA Percent Paced: 69 %
Brady Statistic RV Percent Paced: 1 %
Date Time Interrogation Session: 20231214020012
Implantable Lead Connection Status: 753985
Implantable Lead Connection Status: 753985
Implantable Lead Implant Date: 20210915
Implantable Lead Implant Date: 20210915
Implantable Lead Location: 753859
Implantable Lead Location: 753860
Implantable Pulse Generator Implant Date: 20210915
Lead Channel Impedance Value: 440 Ohm
Lead Channel Impedance Value: 560 Ohm
Lead Channel Pacing Threshold Amplitude: 0.5 V
Lead Channel Pacing Threshold Amplitude: 0.625 V
Lead Channel Pacing Threshold Pulse Width: 0.5 ms
Lead Channel Pacing Threshold Pulse Width: 0.5 ms
Lead Channel Sensing Intrinsic Amplitude: 10.4 mV
Lead Channel Sensing Intrinsic Amplitude: 3.1 mV
Lead Channel Setting Pacing Amplitude: 0.875
Lead Channel Setting Pacing Amplitude: 1.5 V
Lead Channel Setting Pacing Pulse Width: 0.5 ms
Lead Channel Setting Sensing Sensitivity: 2 mV
Pulse Gen Model: 2272
Pulse Gen Serial Number: 3864545

## 2022-11-27 ENCOUNTER — Other Ambulatory Visit: Payer: Self-pay

## 2022-11-27 ENCOUNTER — Other Ambulatory Visit (HOSPITAL_COMMUNITY): Payer: Self-pay

## 2022-11-27 MED ORDER — ROSUVASTATIN CALCIUM 40 MG PO TABS: ORAL_TABLET | ORAL | 3 refills | Status: DC

## 2022-11-29 ENCOUNTER — Other Ambulatory Visit: Payer: Self-pay

## 2022-11-29 NOTE — Progress Notes (Signed)
Remote pacemaker transmission.   

## 2022-12-06 ENCOUNTER — Ambulatory Visit: Payer: Medicare HMO | Admitting: Nurse Practitioner

## 2022-12-07 ENCOUNTER — Other Ambulatory Visit: Payer: Self-pay | Admitting: Cardiovascular Disease

## 2022-12-12 ENCOUNTER — Telehealth: Payer: Self-pay

## 2022-12-12 NOTE — Telephone Encounter (Signed)
Patient called in stating he fell down the steps and he is hurting on the side where his PPM is and he is sending a transmission to make sure everything is okay.

## 2022-12-12 NOTE — Telephone Encounter (Signed)
Spoke with patient informed him that I had reviewed his transmission and that his lead numbers looked good, his device was functioning normally, patient voiced understanding

## 2022-12-17 ENCOUNTER — Encounter: Payer: Self-pay | Admitting: Hematology & Oncology

## 2022-12-17 ENCOUNTER — Inpatient Hospital Stay (HOSPITAL_BASED_OUTPATIENT_CLINIC_OR_DEPARTMENT_OTHER): Payer: Medicare HMO | Admitting: Hematology & Oncology

## 2022-12-17 ENCOUNTER — Encounter: Payer: Self-pay | Admitting: *Deleted

## 2022-12-17 ENCOUNTER — Other Ambulatory Visit: Payer: Self-pay

## 2022-12-17 ENCOUNTER — Inpatient Hospital Stay: Payer: Medicare HMO | Attending: Hematology & Oncology

## 2022-12-17 VITALS — BP 168/84 | HR 78 | Temp 98.2°F | Resp 16 | Ht 69.0 in | Wt 169.0 lb

## 2022-12-17 DIAGNOSIS — D509 Iron deficiency anemia, unspecified: Secondary | ICD-10-CM | POA: Diagnosis not present

## 2022-12-17 DIAGNOSIS — C880 Waldenstrom macroglobulinemia: Secondary | ICD-10-CM | POA: Insufficient documentation

## 2022-12-17 LAB — CMP (CANCER CENTER ONLY)
ALT: 12 U/L (ref 0–44)
AST: 19 U/L (ref 15–41)
Albumin: 4.6 g/dL (ref 3.5–5.0)
Alkaline Phosphatase: 75 U/L (ref 38–126)
Anion gap: 9 (ref 5–15)
BUN: 14 mg/dL (ref 8–23)
CO2: 28 mmol/L (ref 22–32)
Calcium: 9.6 mg/dL (ref 8.9–10.3)
Chloride: 103 mmol/L (ref 98–111)
Creatinine: 1.3 mg/dL — ABNORMAL HIGH (ref 0.61–1.24)
GFR, Estimated: >60 mL/min (ref >60)
Glucose, Bld: 89 mg/dL (ref 70–99)
Potassium: 3.9 mmol/L (ref 3.5–5.1)
Sodium: 140 mmol/L (ref 135–145)
Total Bilirubin: 0.9 mg/dL (ref 0.3–1.2)
Total Protein: 7.1 g/dL (ref 6.5–8.1)

## 2022-12-17 LAB — CBC WITH DIFFERENTIAL (CANCER CENTER ONLY)
Abs Immature Granulocytes: 0.01 10*3/uL (ref 0.00–0.07)
Basophils Absolute: 0.1 10*3/uL (ref 0.0–0.1)
Basophils Relative: 1 %
Eosinophils Absolute: 0.1 10*3/uL (ref 0.0–0.5)
Eosinophils Relative: 2 %
HCT: 42 % (ref 39.0–52.0)
Hemoglobin: 14.1 g/dL (ref 13.0–17.0)
Immature Granulocytes: 0 %
Lymphocytes Relative: 19 %
Lymphs Abs: 1.1 10*3/uL (ref 0.7–4.0)
MCH: 32.7 pg (ref 26.0–34.0)
MCHC: 33.6 g/dL (ref 30.0–36.0)
MCV: 97.4 fL (ref 80.0–100.0)
Monocytes Absolute: 0.6 10*3/uL (ref 0.1–1.0)
Monocytes Relative: 11 %
Neutro Abs: 3.7 10*3/uL (ref 1.7–7.7)
Neutrophils Relative %: 67 %
Platelet Count: 206 10*3/uL (ref 150–400)
RBC: 4.31 MIL/uL (ref 4.22–5.81)
RDW: 13.2 % (ref 11.5–15.5)
WBC Count: 5.5 10*3/uL (ref 4.0–10.5)
nRBC: 0 % (ref 0.0–0.2)

## 2022-12-17 LAB — IRON AND IRON BINDING CAPACITY (CC-WL,HP ONLY)
Iron: 125 ug/dL (ref 45–182)
Saturation Ratios: 35 % (ref 17.9–39.5)
TIBC: 353 ug/dL (ref 250–450)
UIBC: 228 ug/dL (ref 117–376)

## 2022-12-17 LAB — LACTATE DEHYDROGENASE: LDH: 162 U/L (ref 98–192)

## 2022-12-17 LAB — FERRITIN: Ferritin: 42 ng/mL (ref 24–336)

## 2022-12-17 NOTE — Progress Notes (Signed)
Hematology and Oncology Follow Up Visit  Edwin Webster [id number] 06-Aug-1961 62 y.o. 12/17/2022   Principle Diagnosis:  Waldenstrom's macroglobulinemia Iron deficiency anemia  Current Therapy:   Rituxan/bendamustine-s/p cycle 2 -- d/c on 06/11/2018 Imbruvica  280 mg po q day -- changed on 08/06/2019 IV iron as indicated - last received on 08/16/2022      Interim History:  Edwin Webster is here today for follow-up.  We last saw him back in October.  He had no problems over the holiday season.  His pacemaker is doing well.  He sees Cardiology for this.  He has had no problems with bleeding.  He has had no change in bowel or bladder habits.  His monoclonal studies have always great.  His last monoclonal spike was 0.3 g/dL.  The Ig M level was 420 mg/dL.  The Kappa light chain was 2.7 mg/dL.  He has had no fever.  He has had no cough or shortness of breath.  He does have occasional fatigue.  His last iron studies that were done back in November showed a ferritin of 60 with an iron saturation of 31%.  He has had no rashes.  There is been no leg swelling.  He has had no headache.  He has had no mouth sores.  He is still taking Imbruvica.  He is doing well with this.  It is interesting that his son has CLL.  I think he is on Calquence.  Overall, I would have to say that his performance status is probably ECOG 0.    Medications:  Allergies as of 12/17/2022       Reactions   Adalimumab Other (See Comments)   "blacked out", confused        Medication List        Accurate as of December 17, 2022  9:52 AM. If you have any questions, ask your nurse or doctor.          STOP taking these medications    DULoxetine 30 MG capsule Commonly known as: CYMBALTA Stopped by: [name], MD   famotidine 40 MG tablet Commonly known as: PEPCID Stopped by: [name], MD       TAKE these medications    alendronate 70 MG tablet Commonly known as: FOSAMAX TAKE 1 TABLET (70  MG TOTAL) BY MOUTH EVERY 7 (SEVEN) DAYS. TAKE WITH A FULL GLASS OF WATER ON AN EMPTY STOMACH AND AVOID RECLINING OR LYING DOWN FOR 2 HOURS AFTER TAKING THIS MEDICATION.   aspirin EC 81 MG tablet Take 81 mg daily by mouth.   baclofen 10 MG tablet Commonly known as: LIORESAL TAKE 1 /2 TO 1 TABLET 2 X /DAY IF NEEDED FOR MUSCLE SPASM   busPIRone 7.5 MG tablet Commonly known as: BUSPAR Take 7.5 mg by mouth 3 (three) times daily.   cyanocobalamin 500 MCG tablet Commonly known as: VITAMIN B12 Take 500 mcg by mouth daily.   escitalopram 20 MG tablet Commonly known as: LEXAPRO Take 20 mg by mouth daily.   ezetimibe 10 MG tablet Commonly known as: ZETIA Take 1 tablet (10 mg total) by mouth daily. Please keep scheduled appointment   famciclovir 500 MG tablet Commonly known as: FAMVIR TAKE 1 TABLET BY MOUTH EVERY DAY   folic acid 1 MG tablet Commonly known as: FOLVITE TAKE 1 TABLET DAILY FOR FOLATE DEFICIENCY   Imbruvica 280 MG tablet Generic drug: ibrutinib TAKE 1 TABLET (280 MG) BY MOUTH DAILY.   ipratropium 0.06 % nasal  spray Commonly known as: ATROVENT Use 1 to 2 sprays each nostril 2 to 3 x /day as needed   levocetirizine 5 MG tablet Commonly known as: XYZAL Take  1 tablet  Daily  for Allergies   olmesartan 40 MG tablet Commonly known as: BENICAR Take 1 tablet (40 mg total) by mouth at bedtime. For BP   promethazine-dextromethorphan 6.25-15 MG/5ML syrup Commonly known as: PROMETHAZINE-DM Take 5 mLs by mouth 4 (four) times daily as needed for cough.   rosuvastatin 40 MG tablet Commonly known as: CRESTOR TAKE 1 TABLET BY MOUTH EVERY DAY FOR CHOLESTEROL   triamcinolone cream 0.1 % Commonly known as: KENALOG Apply 1 application topically 2 (two) times daily.   Vitamin D-3 125 MCG (5000 UT) Tabs Take 5,000 Units by mouth 2 (two) times daily.        Allergies:  Allergies  Allergen Reactions   Adalimumab Other (See Comments)    "blacked out", confused      Past Medical History, Surgical history, Social history, and Family History were reviewed and updated.  Review of Systems: Review of Systems  Constitutional: Negative.   HENT: Negative.    Eyes: Negative.   Respiratory: Negative.    Cardiovascular: Negative.   Gastrointestinal:  Positive for blood in stool.  Genitourinary: Negative.   Musculoskeletal: Negative.   Skin: Negative.   Neurological: Negative.   Endo/Heme/Allergies: Negative.   Psychiatric/Behavioral: Negative.        Physical Exam:   Wt Readings from Last 3 Encounters:  12/17/22 169 lb (76.7 kg)  10/15/22 170 lb (77.1 kg)  09/26/22 170 lb 3.2 oz (77.2 kg)  Vital signs show a temperature of 97.5.  Pulse 62.  Blood pressure 174/84.  Weight is 170 pounds.  Physical Exam Vitals reviewed.  HENT:     Head: Normocephalic and atraumatic.  Eyes:     Pupils: Pupils are equal, round, and reactive to light.  Cardiovascular:     Rate and Rhythm: Normal rate and regular rhythm.     Heart sounds: Normal heart sounds.  Pulmonary:     Effort: Pulmonary effort is normal.     Breath sounds: Normal breath sounds.  Abdominal:     General: Bowel sounds are normal.     Palpations: Abdomen is soft.  Musculoskeletal:        General: No tenderness or deformity. Normal range of motion.     Cervical back: Normal range of motion.  Lymphadenopathy:     Cervical: No cervical adenopathy.  Skin:    General: Skin is warm and dry.     Findings: No erythema or rash.  Neurological:     Mental Status: He is alert and oriented to person, place, and time.  Psychiatric:        Behavior: Behavior normal.        Thought Content: Thought content normal.        Judgment: Judgment normal.      Lab Results  Component Value Date   WBC 5.5 12/17/2022   HGB 14.1 12/17/2022   HCT 42.0 12/17/2022   MCV 97.4 12/17/2022   PLT 206 12/17/2022   Lab Results  Component Value Date   FERRITIN 60 10/15/2022   IRON 106 10/15/2022   TIBC  344 10/15/2022   UIBC 238 10/15/2022   IRONPCTSAT 31 10/15/2022   Lab Results  Component Value Date   RETICCTPCT 1.2 01/11/2022   RBC 4.31 12/17/2022   RETICCTABS 33,120 02/12/2018   Lab Results  Component  Value Date   KPAFRELGTCHN 27.0 (H) 10/15/2022   LAMBDASER 12.7 10/15/2022   KAPLAMBRATIO 2.13 (H) 10/15/2022   Lab Results  Component Value Date   IGGSERUM QNSTST 10/15/2022   IGA 54 (L) 10/15/2022   IGMSERUM QNSTST 10/15/2022   Lab Results  Component Value Date   TOTALPROTELP 6.3 10/15/2022   ALBUMINELP 3.3 10/15/2022   A1GS 0.3 10/15/2022   A2GS 0.8 10/15/2022   BETS 0.9 10/15/2022   BETA2SER 0.2 02/18/2018   GAMS 1.0 10/15/2022   MSPIKE 0.3 (H) 10/15/2022   SPEI Comment 01/11/2022     Chemistry      Component Value Date/Time   NA 138 10/15/2022 0955   NA 141 08/04/2020 1203   K 3.8 10/15/2022 0955   CL 103 10/15/2022 0955   CO2 26 10/15/2022 0955   BUN 13 10/15/2022 0955   BUN 13 08/04/2020 1203   CREATININE 1.25 (H) 10/15/2022 0955   CREATININE 1.21 09/26/2022 1510      Component Value Date/Time   CALCIUM 9.4 10/15/2022 0955   ALKPHOS 69 10/15/2022 0955   AST 17 10/15/2022 0955   ALT 11 10/15/2022 0955   BILITOT 0.7 10/15/2022 0955      Impression and Plan: Edwin Webster is a very pleasant 62 yo caucasian gentleman with Waldenstrom's macroglobulinemia.  He is on single agent Imbruvica.  He is doing well on this from my point of view.  We will check his monoclonal studies and see how they look.  We will go ahead and plan to get him back in 3 months.  I think this is reasonable given his blood counts being so good.  We will try to get him through the Winter and see him back in the Spring.   [name], MD 1/22/20249:52 AM

## 2022-12-18 LAB — KAPPA/LAMBDA LIGHT CHAINS
Kappa free light chain: 24.9 mg/L — ABNORMAL HIGH (ref 3.3–19.4)
Kappa, lambda light chain ratio: 1.93 — ABNORMAL HIGH (ref 0.26–1.65)
Lambda free light chains: 12.9 mg/L (ref 5.7–26.3)

## 2022-12-18 LAB — IGG, IGA, IGM
IgA: 52 mg/dL — ABNORMAL LOW (ref 61–437)
IgG (Immunoglobin G), Serum: 615 mg/dL (ref 603–1613)
IgM (Immunoglobulin M), Srm: 437 mg/dL — ABNORMAL HIGH (ref 20–172)

## 2022-12-21 ENCOUNTER — Other Ambulatory Visit (HOSPITAL_COMMUNITY): Payer: Self-pay

## 2022-12-24 LAB — PROTEIN ELECTROPHORESIS, SERUM, WITH REFLEX
A/G Ratio: 1.4 (ref 0.7–1.7)
Albumin ELP: 3.9 g/dL (ref 2.9–4.4)
Alpha-1-Globulin: 0.3 g/dL (ref 0.0–0.4)
Alpha-2-Globulin: 0.7 g/dL (ref 0.4–1.0)
Beta Globulin: 0.9 g/dL (ref 0.7–1.3)
Gamma Globulin: 0.9 g/dL (ref 0.4–1.8)
Globulin, Total: 2.7 g/dL (ref 2.2–3.9)
M-Spike, %: 0.4 g/dL — ABNORMAL HIGH
SPEP Interpretation: 0
Total Protein ELP: 6.6 g/dL (ref 6.0–8.5)

## 2022-12-24 LAB — IMMUNOFIXATION REFLEX, SERUM
IgA: 56 mg/dL — ABNORMAL LOW (ref 61–437)
IgG (Immunoglobin G), Serum: 663 mg/dL (ref 603–1613)
IgM (Immunoglobulin M), Srm: 477 mg/dL — ABNORMAL HIGH (ref 20–172)

## 2022-12-27 ENCOUNTER — Other Ambulatory Visit (HOSPITAL_COMMUNITY): Payer: Self-pay

## 2022-12-28 ENCOUNTER — Other Ambulatory Visit: Payer: Self-pay

## 2022-12-28 ENCOUNTER — Other Ambulatory Visit (HOSPITAL_COMMUNITY): Payer: Self-pay

## 2022-12-28 ENCOUNTER — Telehealth: Payer: Self-pay

## 2022-12-28 NOTE — Telephone Encounter (Signed)
Oral Oncology Patient Advocate Encounter   Received notification from [facility] that patient was out of grant funding for medication. No open grants currently and patient has been added to wait list. Applying for Manufacturer Assistance in the meantime.   Began application for assistance for Imbruvica through [place] [facility] [employer] .   Application will be submitted upon completion of necessary supporting documentation.   [employer]'s phone number [phone].   I will continue to check the status until final determination.   Berdine Addison, [facility] Patient [place]  [phone] (phone) [phone] (fax) 12/28/2022 2:58 PM

## 2022-12-28 NOTE — Telephone Encounter (Signed)
Oral Oncology Patient Advocate Encounter  Reached out and spoke with patient regarding PAP paperwork, explained that I would send it to their preferred email via [employer].   Confirmed email address: [email or web address]    Patient expressed understanding and consent.  Will follow up once paperwork has been signed and returned.   Berdine Addison, [facility] Patient [place]  [phone] (phone) [phone] (fax) 12/28/2022 2:59 PM

## 2022-12-28 NOTE — Telephone Encounter (Signed)
Received patient signatures. I will submit once MD signatures have been received.   Berdine Addison, [facility] Patient [facility]  [phone] (phone) [phone] (fax) 12/28/2022 4:01 PM

## 2023-01-01 ENCOUNTER — Other Ambulatory Visit (HOSPITAL_COMMUNITY): Payer: Self-pay

## 2023-01-02 ENCOUNTER — Encounter: Payer: Self-pay | Admitting: Nurse Practitioner

## 2023-01-02 ENCOUNTER — Ambulatory Visit (INDEPENDENT_AMBULATORY_CARE_PROVIDER_SITE_OTHER): Payer: Medicare HMO | Admitting: Nurse Practitioner

## 2023-01-02 VITALS — BP 140/80 | HR 72 | Temp 98.1°F | Ht 69.0 in | Wt 171.0 lb

## 2023-01-02 DIAGNOSIS — I495 Sick sinus syndrome: Secondary | ICD-10-CM | POA: Diagnosis not present

## 2023-01-02 DIAGNOSIS — Z0000 Encounter for general adult medical examination without abnormal findings: Secondary | ICD-10-CM

## 2023-01-02 DIAGNOSIS — S22080A Wedge compression fracture of T11-T12 vertebra, initial encounter for closed fracture: Secondary | ICD-10-CM

## 2023-01-02 DIAGNOSIS — R7309 Other abnormal glucose: Secondary | ICD-10-CM

## 2023-01-02 DIAGNOSIS — M810 Age-related osteoporosis without current pathological fracture: Secondary | ICD-10-CM

## 2023-01-02 DIAGNOSIS — I10 Essential (primary) hypertension: Secondary | ICD-10-CM | POA: Diagnosis not present

## 2023-01-02 DIAGNOSIS — E782 Mixed hyperlipidemia: Secondary | ICD-10-CM

## 2023-01-02 DIAGNOSIS — R6889 Other general symptoms and signs: Secondary | ICD-10-CM | POA: Diagnosis not present

## 2023-01-02 DIAGNOSIS — E559 Vitamin D deficiency, unspecified: Secondary | ICD-10-CM

## 2023-01-02 DIAGNOSIS — Z0001 Encounter for general adult medical examination with abnormal findings: Secondary | ICD-10-CM | POA: Diagnosis not present

## 2023-01-02 DIAGNOSIS — Z85828 Personal history of other malignant neoplasm of skin: Secondary | ICD-10-CM

## 2023-01-02 DIAGNOSIS — C880 Waldenstrom macroglobulinemia: Secondary | ICD-10-CM | POA: Diagnosis not present

## 2023-01-02 DIAGNOSIS — F3341 Major depressive disorder, recurrent, in partial remission: Secondary | ICD-10-CM

## 2023-01-02 DIAGNOSIS — Z79899 Other long term (current) drug therapy: Secondary | ICD-10-CM

## 2023-01-02 DIAGNOSIS — I2510 Atherosclerotic heart disease of native coronary artery without angina pectoris: Secondary | ICD-10-CM

## 2023-01-02 DIAGNOSIS — M0600 Rheumatoid arthritis without rheumatoid factor, unspecified site: Secondary | ICD-10-CM | POA: Diagnosis not present

## 2023-01-02 DIAGNOSIS — D500 Iron deficiency anemia secondary to blood loss (chronic): Secondary | ICD-10-CM

## 2023-01-02 DIAGNOSIS — Z86010 Personal history of colonic polyps: Secondary | ICD-10-CM

## 2023-01-02 DIAGNOSIS — Z6824 Body mass index (BMI) 24.0-24.9, adult: Secondary | ICD-10-CM

## 2023-01-02 NOTE — Progress Notes (Signed)
MEDICARE ANNUAL WELLNESS VISIT AND FOLLOW UP Assessment:   Edwin Webster was seen today for follow-up and medicare wellness.  Diagnoses and all orders for this visit:  Annual Medicare Wellness Visit Due annually  Health maintenance reviewed  CAD Cardiology following  Control blood pressure, cholesterol, glucose, increase exercise.  Denies angina Continue lipid management for miminum LDL <70, ASA  Primary hypertension Continue medication Discussed DASH (Dietary Approaches to Stop Hypertension) DASH diet is lower in sodium than a typical American diet. Cut back on foods that are high in saturated fat, cholesterol, and trans fats. Eat more whole-grain foods, fish, poultry, and nuts Remain active and exercise as tolerated daily.  Monitor BP at home-Call if greater than 130/80.  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 Recommended diet heavy in fruits and veggies, omega 3's. Decrease consumption of animal meats, cheeses, and dairy products. Remain active and exercise as tolerated. Continue to monitor. Check lipids/TSH   Vitamin D deficiency At goal at recent check; continue to recommend supplementation for goal of 60-100 Defer vitamin D level  Medication management All medications discussed and reviewed in full. All questions and concerns regarding medications addressed.    Abnormal glucose Recent A1Cs at goal Education: Reviewed 'ABCs' of diabetes management  Discussed goals to be met and/or maintained include A1C  (<7) Blood pressure (<130/80) Cholesterol (LDL <70) Continue Eye Exam yearly  Continue Dental Exam Q6 mo Discussed dietary recommendations Discussed Physical Activity recommendations Check A1C    BMI 24.0-24.9, adult  Discussed appropriate BMI Diet modification. Physical activity. Encouraged/praised to build confidence.   Iron deficiency anemia due to chronic blood loss Supplement iron as needed; hematology is following  History of basal cell cancer Derm following annually   History of colon polyps High fiber diet, next due in 2029 per Dr. Hilarie Fredrickson  Compression fracture, T12 [facility])  Was not recommended kyphoplasty Managing with tylenol and gabapentin for now  Osteoporosis Repeat DEXA 2 years, continue Vit D and Ca, weight bearing exercises On alendronate and tolerating well   Medication management All medications discussed and reviewed in full. All questions and concerns regarding medications addressed.    Recent blood work obtained 11/2022 reviewed and stable - defer this time.  Check lipids in 3 mo.    Orders Placed This Encounter  Procedures   DG Bone Density    Standing Status:   Future    Standing Expiration Date:   01/03/2024    Order Specific Question:   Reason for Exam (SYMPTOM  OR DIAGNOSIS REQUIRED)    Answer:   Screening    Order Specific Question:   Preferred imaging location?    Answer:   [facility]    Notify office for further evaluation and treatment, questions or concerns if any reported s/s fail to improve.   The patient was advised to call back or seek an in-person evaluation if any symptoms worsen or if the condition fails to improve as anticipated.   Further disposition pending results of labs. Discussed med's effects and SE's.    I discussed the assessment and treatment plan with the patient. The patient was provided an opportunity to ask questions and all were answered. The patient  agreed with the plan and demonstrated an understanding  of the instructions.  Discussed med's effects and SE's. Screening labs and tests as requested with regular follow-up as recommended.  I provided 35 minutes of face-to-face time during this encounter including counseling, chart review, and critical decision making was preformed.   Future Appointments  Date Time Provider [place]  02/07/2023  7:10 AM [facility] DEVICE REMOTES [facility]  03/18/2023 10:00 AM [facility] LAB [facility] None  03/18/2023 10:15 AM Ennever, Rudell Cobb, MD [facility] None  04/04/2023 10:30 AM Unk Pinto, MD [facility] None  05/09/2023  7:10 AM [facility] DEVICE REMOTES [facility]  10/02/2023  2:00 PM Unk Pinto, MD [facility] None     Plan:   During the course of the visit the patient was educated and counseled about appropriate screening and preventive services including:   Pneumococcal vaccine  Influenza vaccine Prevnar 13 Td vaccine Screening electrocardiogram Colorectal cancer screening Diabetes screening Glaucoma screening Nutrition counseling    Subjective:  Edwin Webster is a 62 y.o. male who presents for Medicare Annual Wellness Visit and 3 month follow up for HTN, hyperlipidemia, glucose management, and vitamin D Def.   Overall he reports feeling well.  States he fell on his left side after getting tangled up in the dog leash and pulled forward.  This occurred 3 weeks ago.  He is sore in the left upper rib area under his axilla.  He reports some initial pain with inspiration but that has now resolved.  He has some tenderness to the area upon palpation.  Denies difficulty breathing, pink frothy sputum, hematemesis, hemoptysis, wheezing, fever, chills.  Take Tylenol for pain and well managed.   He has basal cell to his nose, had removal scheduled at skin surgery center in Jan 2021 and continues with annual follow up.   Pateint has as sero-negative RA by U/S with negative RA factor & Anti-CCP and is followed by Dr  Lenna Gilford. Off of methotrexate, takes NSAID PRN, mostly inhands, and doing well.   He was diagnosed with Waldenstrom's macroglobulinemia 2019 after persistent unexplained fatigue; he now follows with Dr. Marin Olp, Rituxan/bendamustine-s/p cycle 2, Imbruvica 420 mg po q day and reports significant improvement with this, ECOG of 0, most notably 12/17/22 by Dr. Marin Olp.   He is retired. He has iron deficiency anemia that is also monitored, IV iron as indicated. He has no problems with bleeding, no changes to bowel or bladder habits. Denies fever, SOB.  Does have occasional fatigue.  Son has CLL.    Patient is on Alendronate for a thoracic Compression fx (T12, L1) due to Osteoporosis. He did see ortho Dr. Lorin Mercy but was released to PRN follow up, was advised not candidate for kyphoplasty and was released. Has pain but managing with tylenol and gabapentin 300 mg TID.   he has a diagnosis of depression/anxiety and is currently on lexapro 20 mg daily, buspar 5 mg TID PRN, takes BID per patient, been able to taper off of xanax.   BMI is Body mass index is 25.25 kg/m., he has been working on diet and exercise. Continues to remain active on property.    Wt Readings from Last 3 Encounters:  01/02/23 171 lb (77.6 kg)  12/17/22 169 lb (76.7 kg)  10/15/22 170 lb (77.1 kg)   On Sept 2021 Coronary CTA found NonIschemic CAD and also in Sept 2021 had a PPM inserted for symptomatic Bradycardia/sick sinus syndrome( Dr Curt Bears - Cardiologist )  His blood pressure has been controlled  at home, today their BP is BP: (!) 140/80 He does workout. He denies chest pain, shortness of breath, dizziness.   He is on cholesterol medication (rosuvastatin 40 mg daily and zetia 10 mg daily) and denies myalgias. His cholesterol is at goal. The cholesterol last visit was:   Lab Results  Component Value Date   CHOL 178 09/26/2022   HDL 58 09/26/2022   LDLCALC 104 (H) 09/26/2022   TRIG 70 09/26/2022   CHOLHDL 3.1 09/26/2022   He  has been working on diet and exercise for glucose management, and denies nausea, paresthesia of the feet, polydipsia, polyuria, visual disturbances and vomiting. Last A1C in the office was:  Lab Results  Component Value Date   HGBA1C 5.4 09/26/2022   Last GFR Lab Results  Component Value Date   GFRNONAA >60 12/17/2022   Patient is on Vitamin D supplement, taking 10000 IU dailydaily only Lab Results  Component Value Date   VD25OH 61 09/26/2022        Latest Ref Rng & Units 12/17/2022    9:30 AM 10/15/2022    9:55 AM 09/26/2022    3:10 PM  CBC  WBC 4.0 - 10.5 K/uL 5.5  4.1  5.5   Hemoglobin 13.0 - 17.0 g/dL 14.1  13.4  14.4   Hematocrit 39.0 - 52.0 % 42.0  40.8  41.4   Platelets 150 - 400 K/uL 206  197  255    Lab Results  Component Value Date   IRON 125 12/17/2022   TIBC 353 12/17/2022   FERRITIN 42 12/17/2022     Medication Review:  Current Outpatient Medications (Endocrine & Metabolic):    alendronate (FOSAMAX) 70 MG tablet, TAKE 1 TABLET (70 MG TOTAL) BY MOUTH EVERY 7 (SEVEN) DAYS. TAKE WITH A FULL GLASS OF WATER ON AN EMPTY STOMACH AND AVOID RECLINING OR LYING DOWN FOR 2 HOURS AFTER TAKING THIS MEDICATION.  Current Outpatient Medications (Cardiovascular):    ezetimibe (ZETIA) 10 MG tablet, Take 1 tablet (10 mg total) by mouth daily. Please keep scheduled appointment   olmesartan (BENICAR) 40 MG tablet, Take 1 tablet (40 mg total) by mouth at bedtime. For BP   rosuvastatin (CRESTOR) 40 MG tablet, TAKE 1 TABLET BY MOUTH EVERY DAY FOR CHOLESTEROL  Current Outpatient Medications (Respiratory):    ipratropium (ATROVENT) 0.06 % nasal spray, Use 1 to 2 sprays each nostril 2 to 3 x /day as needed   levocetirizine (XYZAL) 5 MG tablet, Take  1 tablet  Daily  for Allergies   promethazine-dextromethorphan (PROMETHAZINE-DM) 6.25-15 MG/5ML syrup, Take 5 mLs by mouth 4 (four) times daily as needed for cough.  Current Outpatient Medications (Analgesics):    aspirin EC 81 MG tablet,  Take 81 mg daily by mouth.  Current Outpatient Medications (Hematological):    folic acid (FOLVITE) 1 MG tablet, TAKE 1 TABLET DAILY FOR FOLATE DEFICIENCY   vitamin B-12 (CYANOCOBALAMIN) 500 MCG tablet, Take 500 mcg by mouth daily.  Current Outpatient Medications (Other):    baclofen (LIORESAL) 10 MG tablet, TAKE 1 /2 TO 1 TABLET 2 X /DAY IF NEEDED FOR MUSCLE SPASM   busPIRone (BUSPAR) 7.5 MG tablet, Take 7.5 mg by mouth 3 (three) times daily.   Cholecalciferol (VITAMIN D-3) 5000 UNITS TABS, Take 5,000 Units by mouth 2 (two) times daily.    escitalopram (LEXAPRO) 20 MG tablet, Take 20 mg by mouth daily.   famciclovir (FAMVIR) 500 MG tablet, TAKE 1 TABLET BY MOUTH EVERY DAY   ibrutinib (IMBRUVICA) 280  MG tablet, TAKE 1 TABLET (280 MG) BY MOUTH DAILY.   triamcinolone cream (KENALOG) 0.1 %, Apply 1 application topically 2 (two) times daily.  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  Screening Tests Immunization History  Administered Date(s) Administered   DTaP 06/04/2011   Hepatitis B 11/26/2010   Hepatitis B, PED/ADOLESCENT 08/06/2011   Influenza Inj Mdck Quad With Preservative 09/13/2017, 08/21/2018, 09/01/2020   Influenza Split 09/02/2014   Influenza Whole 09/17/2013    Influenza, High Dose Seasonal PF 09/26/2022   Influenza, Seasonal, Injecte, Preservative Fre 10/11/2015   Influenza-Unspecified 09/13/2017   PFIZER(Purple Top)SARS-COV-2 Vaccination 01/24/2020, 03/06/2020, 08/03/2020   PNEUMOCOCCAL CONJUGATE-20 12/06/2021   PPD Test 10/11/2015, 08/05/2019, 09/01/2020   Pneumococcal Polysaccharide-23 12/06/2020    Preventative care: Last colonoscopy: 02/24/2021, hx of polyps, Dr. Hilarie Fredrickson, 7 year follow up - due 2029  DEXA: 06/22/2021, T -2.9, on osteoporosis, on alendronate - Due - ordered  Prior vaccinations: TD or Tdap: 05/2011 will get PRN due to cost Influenza: 09/2022 at pharmacy, will send report Pneumococcal: 12/06/2020 Prevnar 20: 2022 due to immunosuppressed Shingles/Zostavax: contraindicated, takes famciclovir daily prophylaxis  Covid 19: 2/2 + booster x 2, most recent requested  Names of Other Physician/Practitioners you currently use: 1. [facility] here for primary care 2. Dr. Gershon Crane, eye doctor, last visit 2022, glasses 3. none, dentist, last visit remote, full dentures  Patient Care Team: Unk Pinto, MD as PCP - General (Internal Medicine) Constance Haw, MD as PCP - Electrophysiology (Cardiology)  Surgical: He  has a past surgical history that includes Rotator cuff repair (1996); Knee surgery; Finger surgery; iron infusion; PACEMAKER IMPLANT (N/A, 08/10/2020); Wisdom tooth extraction; and Colonoscopy (2016). Family His family history includes Breast cancer in his mother; COPD in his maternal grandfather and mother; Colon cancer (age of onset: 83) in his maternal grandfather; Colon polyps in his brother and mother; Colon polyps (age of onset: 34) in his daughter; Colon polyps (age of onset: 40) in his maternal grandfather; Heart failure in his mother; Hyperlipidemia in his brother and mother; Hypertension in his father; Rectal cancer in his mother; Stroke in his maternal grandfather. Social  history  He reports that he quit smoking about 12 years ago. His smoking use included cigarettes. He smoked an average of 2 packs per day. He has never used smokeless tobacco. He reports that he does not drink alcohol and does not use drugs.  MEDICARE WELLNESS OBJECTIVES: Physical activity:   Cardiac risk factors:   Depression/mood screen:      09/26/2022    2:06 AM  Depression screen PHQ 2/9  Decreased Interest 0  Down, Depressed, Hopeless 0  PHQ - 2 Score 0    ADLs:     01/02/2023   10:58 AM 09/26/2022    2:07 AM  In your present state of health, do you have any difficulty performing the following activities:  Hearing? 0 0  Comment Has hearing  aides   Vision? 0 0  Difficulty concentrating or making decisions? 0 0  Walking or climbing stairs? 0 0  Dressing or bathing? 0 0  Doing errands, shopping? 0 0  Preparing Food and eating ? N   Using the Toilet? N   In the past six months, have you accidently leaked urine? N   Do you have problems with loss of bowel control? N   Managing your Medications? N   Managing your Finances? N   Housekeeping or managing your Housekeeping? N      Cognitive Testing  Alert? Yes  Normal Appearance?Yes  Oriented to person? Yes  Place? Yes   Time? Yes  Recall of three objects?  Yes  Can perform simple calculations? Yes  Displays appropriate judgment?Yes  Can read the correct time from a watch face?Yes  EOL planning: Does Patient Have a Medical Advance Directive?: No   Objective:   Today's Vitals   01/02/23 1035  BP: (!) 140/80  Pulse: 72  Temp: 98.1 F (36.7 C)  SpO2: 98%  Weight: 171 lb (77.6 kg)  Height: '5\' 9"'$  (1.753 m)     Body mass index is 25.25 kg/m.  General appearance: alert, no distress, WD/WN, male HEENT: normocephalic, sclerae anicteric, TMs pearly, nares patent, no discharge or erythema, pharynx normal Oral cavity: MMM, no lesions Neck: supple, no lymphadenopathy, no thyromegaly, no masses Heart: RRR, normal S1,  S2, no murmurs Lungs: CTA bilaterally, no wheezes, rhonchi, or rales Abdomen: +bs, soft, non tender, non distended, no masses, no hepatomegaly, no splenomegaly Musculoskeletal: nontender, no swelling, no obvious deformity Extremities: no edema, no cyanosis, no clubbing Pulses: 2+ symmetric, upper and lower extremities, normal cap refill Neurological: alert, oriented x 3, CN2-12 intact, strength normal upper extremities and lower extremities, sensation normal throughout, DTRs 2+ throughout, no cerebellar signs, gait normal Psychiatric: normal affect, behavior normal, pleasant   Medicare Attestation I have personally reviewed: The patient's medical and social history Their use of alcohol, tobacco or illicit drugs Their current medications and supplements The patient's functional ability including ADLs,fall risks, home safety risks, cognitive, and hearing and visual impairment Diet and physical activities Evidence for depression or mood disorders  The patient's weight, height, BMI, and visual acuity have been recorded in the chart.  I have made referrals, counseling, and provided education to the patient based on review of the above and I have provided the patient with a written personalized care plan for preventive services.     Darrol Jump, NP   01/02/2023

## 2023-01-02 NOTE — Telephone Encounter (Signed)
Oral Oncology Patient Advocate Encounter   Submitted application for assistance for Imbruvica to [employer] Patient Access Support.   Application submitted via e-fax to [phone]   [employer]'s phone number [phone].   I will continue to check the status until final determination.   Berdine Addison, [facility] Patient [place]  [phone] (phone) [phone] (fax) 01/02/2023 8:28 AM

## 2023-01-02 NOTE — Patient Instructions (Signed)

## 2023-01-03 ENCOUNTER — Other Ambulatory Visit (HOSPITAL_COMMUNITY): Payer: Self-pay

## 2023-01-03 NOTE — Telephone Encounter (Signed)
Received notification from [employer] that they needed documentation showing no open grant funds for patient's disease state before proceeding with processing. I faxed over a screenshot showing no open funding from Patient [employer] to [phone]. I will continue to follow and update until final determination.   Berdine Addison, [facility] Patient [place]  [phone] (phone) [phone] (fax) 01/03/2023 9:44 AM

## 2023-01-04 NOTE — Telephone Encounter (Signed)
Called to check status of application. Confirmed with representative that they received copy of [employer] showing no open grants for patient's disease state. Informed it would probably take 24 hours to finish processing and advised to call back and check status on Monday 11/07/23 if we haven't received a fax or notification by then. I will continue to follow and update until final determination.   Berdine Addison, [facility] Patient [place]  [phone] (phone) [phone] (fax) 01/04/2023 10:25 AM

## 2023-01-07 NOTE — Telephone Encounter (Signed)
Called to check status of application. Informed by representative it was approved today, 02.12.24, and that approval letter was being faxed over. I will contact patient to inform them of next steps once approval letter is received.

## 2023-01-10 NOTE — Telephone Encounter (Signed)
Oral Oncology Patient Advocate Encounter   Received notification that the application for assistance for Imbruvica through [facility] has been approved.   [employer]'s phone number [phone].   Effective dates: 02.12.24 through 12.31.24  I have spoken to the patient. Patient knows to call [phone] if they have not received their medication by 02.16.24.  Berdine Addison, [facility] Patient [place]  [phone] (phone) [phone] (fax) 01/10/2023 9:45 AM

## 2023-02-07 ENCOUNTER — Ambulatory Visit: Payer: Medicare HMO

## 2023-02-07 DIAGNOSIS — I495 Sick sinus syndrome: Secondary | ICD-10-CM | POA: Diagnosis not present

## 2023-02-08 ENCOUNTER — Other Ambulatory Visit: Payer: Self-pay | Admitting: Internal Medicine

## 2023-02-08 DIAGNOSIS — J302 Other seasonal allergic rhinitis: Secondary | ICD-10-CM

## 2023-02-08 LAB — CUP PACEART REMOTE DEVICE CHECK
Battery Remaining Longevity: 101 mo
Battery Remaining Percentage: 81 %
Battery Voltage: 3.01 V
Brady Statistic AP VP Percent: 1 %
Brady Statistic AP VS Percent: 73 %
Brady Statistic AS VP Percent: 1 %
Brady Statistic AS VS Percent: 27 %
Brady Statistic RA Percent Paced: 73 %
Brady Statistic RV Percent Paced: 1 %
Date Time Interrogation Session: 20240314034319
Implantable Lead Connection Status: 753985
Implantable Lead Connection Status: 753985
Implantable Lead Implant Date: 20210915
Implantable Lead Implant Date: 20210915
Implantable Lead Location: 753859
Implantable Lead Location: 753860
Implantable Pulse Generator Implant Date: 20210915
Lead Channel Impedance Value: 440 Ohm
Lead Channel Impedance Value: 560 Ohm
Lead Channel Pacing Threshold Amplitude: 0.5 V
Lead Channel Pacing Threshold Amplitude: 0.75 V
Lead Channel Pacing Threshold Pulse Width: 0.5 ms
Lead Channel Pacing Threshold Pulse Width: 0.5 ms
Lead Channel Sensing Intrinsic Amplitude: 10.2 mV
Lead Channel Sensing Intrinsic Amplitude: 2.9 mV
Lead Channel Setting Pacing Amplitude: 1 V
Lead Channel Setting Pacing Amplitude: 1.5 V
Lead Channel Setting Pacing Pulse Width: 0.5 ms
Lead Channel Setting Sensing Sensitivity: 2 mV
Pulse Gen Model: 2272
Pulse Gen Serial Number: 3864545

## 2023-03-12 NOTE — Progress Notes (Signed)
Remote pacemaker transmission.   

## 2023-03-18 ENCOUNTER — Inpatient Hospital Stay: Payer: Medicare HMO | Attending: Hematology & Oncology

## 2023-03-18 ENCOUNTER — Other Ambulatory Visit: Payer: Self-pay

## 2023-03-18 ENCOUNTER — Inpatient Hospital Stay (HOSPITAL_BASED_OUTPATIENT_CLINIC_OR_DEPARTMENT_OTHER): Payer: Medicare HMO | Admitting: Hematology & Oncology

## 2023-03-18 ENCOUNTER — Encounter: Payer: Self-pay | Admitting: Hematology & Oncology

## 2023-03-18 VITALS — BP 152/75 | HR 62 | Temp 97.9°F | Resp 18 | Ht 69.0 in | Wt 165.8 lb

## 2023-03-18 DIAGNOSIS — C880 Waldenstrom macroglobulinemia: Secondary | ICD-10-CM | POA: Insufficient documentation

## 2023-03-18 DIAGNOSIS — D500 Iron deficiency anemia secondary to blood loss (chronic): Secondary | ICD-10-CM

## 2023-03-18 DIAGNOSIS — Z950 Presence of cardiac pacemaker: Secondary | ICD-10-CM | POA: Insufficient documentation

## 2023-03-18 DIAGNOSIS — D509 Iron deficiency anemia, unspecified: Secondary | ICD-10-CM | POA: Insufficient documentation

## 2023-03-18 LAB — CBC WITH DIFFERENTIAL (CANCER CENTER ONLY)
Abs Immature Granulocytes: 0.01 10*3/uL (ref 0.00–0.07)
Basophils Absolute: 0 10*3/uL (ref 0.0–0.1)
Basophils Relative: 1 %
Eosinophils Absolute: 0 10*3/uL (ref 0.0–0.5)
Eosinophils Relative: 1 %
HCT: 41 % (ref 39.0–52.0)
Hemoglobin: 13.8 g/dL (ref 13.0–17.0)
Immature Granulocytes: 0 %
Lymphocytes Relative: 21 %
Lymphs Abs: 0.9 10*3/uL (ref 0.7–4.0)
MCH: 32.4 pg (ref 26.0–34.0)
MCHC: 33.7 g/dL (ref 30.0–36.0)
MCV: 96.2 fL (ref 80.0–100.0)
Monocytes Absolute: 0.5 10*3/uL (ref 0.1–1.0)
Monocytes Relative: 12 %
Neutro Abs: 2.9 10*3/uL (ref 1.7–7.7)
Neutrophils Relative %: 65 %
Platelet Count: 208 10*3/uL (ref 150–400)
RBC: 4.26 MIL/uL (ref 4.22–5.81)
RDW: 13.5 % (ref 11.5–15.5)
WBC Count: 4.3 10*3/uL (ref 4.0–10.5)
nRBC: 0 % (ref 0.0–0.2)

## 2023-03-18 LAB — CMP (CANCER CENTER ONLY)
ALT: 17 U/L (ref 0–44)
AST: 25 U/L (ref 15–41)
Albumin: 4.4 g/dL (ref 3.5–5.0)
Alkaline Phosphatase: 75 U/L (ref 38–126)
Anion gap: 8 (ref 5–15)
BUN: 14 mg/dL (ref 8–23)
CO2: 29 mmol/L (ref 22–32)
Calcium: 9.5 mg/dL (ref 8.9–10.3)
Chloride: 105 mmol/L (ref 98–111)
Creatinine: 1.21 mg/dL (ref 0.61–1.24)
GFR, Estimated: >60 mL/min (ref >60)
Glucose, Bld: 82 mg/dL (ref 70–99)
Potassium: 4.2 mmol/L (ref 3.5–5.1)
Sodium: 142 mmol/L (ref 135–145)
Total Bilirubin: 0.6 mg/dL (ref 0.3–1.2)
Total Protein: 6.8 g/dL (ref 6.5–8.1)

## 2023-03-18 LAB — LACTATE DEHYDROGENASE: LDH: 168 U/L (ref 98–192)

## 2023-03-18 NOTE — Progress Notes (Signed)
Hematology and Oncology Follow Up Visit  Edwin Webster [id number] 08/02/1961 62 y.o. 03/18/2023   Principle Diagnosis:  Waldenstrom's macroglobulinemia Iron deficiency anemia  Current Therapy:   Rituxan/bendamustine-s/p cycle 2 -- d/c on 06/11/2018 Imbruvica  280 mg po q day -- changed on 08/06/2019 IV iron as indicated - last received on 08/16/2022      Interim History:  Edwin Webster is here today for follow-up.  We see him every 3 months.  Since we last saw him, he has been doing pretty well.  He does have a pacemaker in.  He is doing well with the pacemaker.  He is doing well with the Imbruvica.  His last monoclonal spike was 0.4 g/dL.  The IgM level was 450 mg/dL.  The Kappa light chain was 2.5 mg/dL.  He has had no problems with fever.  He has had no issues with COVID.  There is been no nausea or vomiting.  No change in bowel or bladder habits.  When we last saw him back in January, his ferritin was 42 with an iron saturation of 35%.  He has had no bleeding.  He has had no headache.  He has been outside quite a bit.  Overall, I would say that his performance status is probably ECOG 1.   Medications:  Allergies as of 03/18/2023       Reactions   Adalimumab Other (See Comments)   "blacked out", confused        Medication List        Accurate as of March 18, 2023 11:04 AM. If you have any questions, ask your nurse or doctor.          alendronate 70 MG tablet Commonly known as: FOSAMAX TAKE 1 TABLET (70 MG TOTAL) BY MOUTH EVERY 7 (SEVEN) DAYS. TAKE WITH A FULL GLASS OF WATER ON AN EMPTY STOMACH AND AVOID RECLINING OR LYING DOWN FOR 2 HOURS AFTER TAKING THIS MEDICATION.   aspirin EC 81 MG tablet Take 81 mg daily by mouth.   baclofen 10 MG tablet Commonly known as: LIORESAL TAKE 1 /2 TO 1 TABLET 2 X /DAY IF NEEDED FOR MUSCLE SPASM   busPIRone 7.5 MG tablet Commonly known as: BUSPAR Take 7.5 mg by mouth 3 (three) times daily.   cyanocobalamin 500 MCG  tablet Commonly known as: VITAMIN B12 Take 500 mcg by mouth daily.   escitalopram 20 MG tablet Commonly known as: LEXAPRO Take 20 mg by mouth daily.   ezetimibe 10 MG tablet Commonly known as: ZETIA Take 1 tablet (10 mg total) by mouth daily. Please keep scheduled appointment   famciclovir 500 MG tablet Commonly known as: FAMVIR TAKE 1 TABLET BY MOUTH EVERY DAY   folic acid 1 MG tablet Commonly known as: FOLVITE TAKE 1 TABLET DAILY FOR FOLATE DEFICIENCY   Imbruvica 280 MG tablet Generic drug: ibrutinib TAKE 1 TABLET (280 MG) BY MOUTH DAILY.   ipratropium 0.06 % nasal spray Commonly known as: ATROVENT Use 1 to 2 sprays each nostril 2 to 3 x /day as needed   levocetirizine 5 MG tablet Commonly known as: XYZAL TAKE 1 TABLET BY MOUTH DAILY FOR ALLERGIES   olmesartan 40 MG tablet Commonly known as: BENICAR Take 1 tablet (40 mg total) by mouth at bedtime. For BP   promethazine-dextromethorphan 6.25-15 MG/5ML syrup Commonly known as: PROMETHAZINE-DM Take 5 mLs by mouth 4 (four) times daily as needed for cough.   rosuvastatin 40 MG tablet Commonly known as: CRESTOR TAKE 1 TABLET  BY MOUTH EVERY DAY FOR CHOLESTEROL   triamcinolone cream 0.1 % Commonly known as: KENALOG Apply 1 application topically 2 (two) times daily.   Vitamin D-3 125 MCG (5000 UT) Tabs Take 5,000 Units by mouth 2 (two) times daily.        Allergies:  Allergies  Allergen Reactions   Adalimumab Other (See Comments)    "blacked out", confused     Past Medical History, Surgical history, Social history, and Family History were reviewed and updated.   His vital signs show a temperature of 97.9.  Pulse 62.  Blood pressure 152/75.  Weight is 165 pounds.  Review of Systems: Review of Systems  Constitutional: Negative.   HENT: Negative.    Eyes: Negative.   Respiratory: Negative.    Cardiovascular: Negative.   Gastrointestinal:  Positive for blood in stool.  Genitourinary: Negative.    Musculoskeletal: Negative.   Skin: Negative.   Neurological: Negative.   Endo/Heme/Allergies: Negative.   Psychiatric/Behavioral: Negative.        Physical Exam:   Wt Readings from Last 3 Encounters:  03/18/23 165 lb 12.8 oz (75.2 kg)  01/02/23 171 lb (77.6 kg)  12/17/22 169 lb (76.7 kg)    Physical Exam Vitals reviewed.  HENT:     Head: Normocephalic and atraumatic.  Eyes:     Pupils: Pupils are equal, round, and reactive to light.  Cardiovascular:     Rate and Rhythm: Normal rate and regular rhythm.     Heart sounds: Normal heart sounds.  Pulmonary:     Effort: Pulmonary effort is normal.     Breath sounds: Normal breath sounds.  Abdominal:     General: Bowel sounds are normal.     Palpations: Abdomen is soft.  Musculoskeletal:        General: No tenderness or deformity. Normal range of motion.     Cervical back: Normal range of motion.  Lymphadenopathy:     Cervical: No cervical adenopathy.  Skin:    General: Skin is warm and dry.     Findings: No erythema or rash.  Neurological:     Mental Status: He is alert and oriented to person, place, and time.  Psychiatric:        Behavior: Behavior normal.        Thought Content: Thought content normal.        Judgment: Judgment normal.      Lab Results  Component Value Date   WBC 4.3 03/18/2023   HGB 13.8 03/18/2023   HCT 41.0 03/18/2023   MCV 96.2 03/18/2023   PLT 208 03/18/2023   Lab Results  Component Value Date   FERRITIN 42 12/17/2022   IRON 125 12/17/2022   TIBC 353 12/17/2022   UIBC 228 12/17/2022   IRONPCTSAT 35 12/17/2022   Lab Results  Component Value Date   RETICCTPCT 1.2 01/11/2022   RBC 4.26 03/18/2023   RETICCTABS 33,120 02/12/2018   Lab Results  Component Value Date   KPAFRELGTCHN 24.9 (H) 12/17/2022   LAMBDASER 12.9 12/17/2022   KAPLAMBRATIO 1.93 (H) 12/17/2022   Lab Results  Component Value Date   IGGSERUM 615 12/17/2022   IGGSERUM 663 12/17/2022   IGA 52 (L) 12/17/2022    IGA 56 (L) 12/17/2022   IGMSERUM 437 (H) 12/17/2022   IGMSERUM 477 (H) 12/17/2022   Lab Results  Component Value Date   TOTALPROTELP 6.6 12/17/2022   ALBUMINELP 3.9 12/17/2022   A1GS 0.3 12/17/2022   A2GS 0.7 12/17/2022   BETS  0.9 12/17/2022   BETA2SER 0.2 02/18/2018   GAMS 0.9 12/17/2022   MSPIKE 0.4 (H) 12/17/2022   SPEI Comment 01/11/2022     Chemistry      Component Value Date/Time   NA 142 03/18/2023 1003   NA 141 08/04/2020 1203   K 4.2 03/18/2023 1003   CL 105 03/18/2023 1003   CO2 29 03/18/2023 1003   BUN 14 03/18/2023 1003   BUN 13 08/04/2020 1203   CREATININE 1.21 03/18/2023 1003   CREATININE 1.21 09/26/2022 1510      Component Value Date/Time   CALCIUM 9.5 03/18/2023 1003   ALKPHOS 75 03/18/2023 1003   AST 25 03/18/2023 1003   ALT 17 03/18/2023 1003   BILITOT 0.6 03/18/2023 1003      Impression and Plan: Mr. Malacara is a very pleasant 62 yo caucasian gentleman with Waldenstrom's macroglobulinemia.  He is on single agent Imbruvica.  He is doing well on this from my point of view.  We will check his monoclonal studies and see how they look.  We will go ahead and plan to get him back in 3 months.  I think this is reasonable given his blood counts being so good.  We will try to get him through the Winter and see him back in the Spring.   [name], MD 4/22/202411:04 AM

## 2023-03-19 LAB — IGG, IGA, IGM
IgA: 53 mg/dL — ABNORMAL LOW (ref 61–437)
IgG (Immunoglobin G), Serum: 640 mg/dL (ref 603–1613)
IgM (Immunoglobulin M), Srm: 401 mg/dL — ABNORMAL HIGH (ref 20–172)

## 2023-03-19 LAB — KAPPA/LAMBDA LIGHT CHAINS
Kappa free light chain: 23.1 mg/L — ABNORMAL HIGH (ref 3.3–19.4)
Kappa, lambda light chain ratio: 1.76 — ABNORMAL HIGH (ref 0.26–1.65)
Lambda free light chains: 13.1 mg/L (ref 5.7–26.3)

## 2023-03-20 ENCOUNTER — Encounter: Payer: Self-pay | Admitting: Internal Medicine

## 2023-03-20 ENCOUNTER — Other Ambulatory Visit: Payer: Self-pay | Admitting: Internal Medicine

## 2023-03-20 MED ORDER — TRIAMCINOLONE ACETONIDE 0.1 % EX CREA: TOPICAL_CREAM | CUTANEOUS | 12 refills | Status: DC

## 2023-03-25 ENCOUNTER — Encounter: Payer: Self-pay | Admitting: *Deleted

## 2023-03-25 LAB — PROTEIN ELECTROPHORESIS, SERUM, WITH REFLEX
A/G Ratio: 1.6 (ref 0.7–1.7)
Albumin ELP: 3.9 g/dL (ref 2.9–4.4)
Alpha-1-Globulin: 0.2 g/dL (ref 0.0–0.4)
Alpha-2-Globulin: 0.6 g/dL (ref 0.4–1.0)
Beta Globulin: 0.8 g/dL (ref 0.7–1.3)
Gamma Globulin: 0.8 g/dL (ref 0.4–1.8)
Globulin, Total: 2.4 g/dL (ref 2.2–3.9)
M-Spike, %: 0.3 g/dL — ABNORMAL HIGH
SPEP Interpretation: 0
Total Protein ELP: 6.3 g/dL (ref 6.0–8.5)

## 2023-03-25 LAB — IMMUNOFIXATION REFLEX, SERUM
IgA: 62 mg/dL (ref 61–437)
IgG (Immunoglobin G), Serum: 690 mg/dL (ref 603–1613)
IgM (Immunoglobulin M), Srm: 446 mg/dL — ABNORMAL HIGH (ref 20–172)

## 2023-04-03 NOTE — Progress Notes (Unsigned)
Future Appointments  Date Time Provider Department  04/04/2023                        6 mo  ov  10:30 AM Lucky Cowboy, MD [facility]  06/18/2023 10:30 AM [name], MD [facility]  10/02/2023                       cpe  2:00 PM Lucky Cowboy, MD [facility]    History of Present Illness:       This very nice 62 y.o.  MWM presents for 6 month follow up with HTN, HLD, Pre-Diabetes and Vitamin D Deficiency. Patient is  followed by  Dr Myna Hidalgo for [facility] Macroglobulinemia  (2019) and  is on Rituxan/bendamustine.  Patient has hx/o seronegative Rheumatoid arthritis and currently is off of MTX  .        Patient is treated for HTN & BP has been controlled at home. Today's  . 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  Patient has had no complaints of any cardiac type chest pain, palpitations, dyspnea / orthopnea / PND, dizziness, claudication, or dependent edema.        Hyperlipidemia is controlled with diet & Rosuvastatin/Ezetimibe. Patient denies myalgias or other med SE's. Last Lipids were near goal:  Lab Results  Component Value Date   CHOL 178 09/26/2022   HDL 58 09/26/2022   LDLCALC 104 (H) 09/26/2022   TRIG 70 09/26/2022   CHOLHDL 3.1 09/26/2022     Also, the patient has been monitored for glucose intolerance. The patient has had no symptoms of reactive hypoglycemia, diabetic polys, paresthesias or visual blurring.  Last A1c was Normal & at goal:  Lab Results  Component Value Date   HGBA1C 5.4 09/26/2022                                                        Further, the patient also has history of Vitamin D Deficiency ("32" /2017) and supplements vitamin D without any suspected side-effects. Last vitamin D was at goal (sl elevated ):  Lab Results  Component Value Date   VD25OH 61 09/26/2022      Current Outpatient Medications  Medication Instructions   alendronate (FOSAMAX) 70 mg, Oral, Every 7  days   aspirin EC 81 mg, Oral, Daily   baclofen (LIORESAL) 10 MG tablet TAKE 1 /2 TO 1 TABLET 2 X /DAY IF NEEDED FOR MUSCLE SPASM   busPIRone (BUSPAR) 7.5 mg, Oral, 3 times daily   cyanocobalamin (VITAMIN B12) 500 mcg, Oral, Daily   escitalopram (LEXAPRO) 20 mg, Oral, Daily   ezetimibe (ZETIA) 10 mg, Oral, Daily, Please keep scheduled appointment   famciclovir (FAMVIR) 500 MG tablet TAKE 1 TABLET BY MOUTH EVERY DAY   folic acid (FOLVITE) 1 MG tablet TAKE 1 TABLET DAILY FOR FOLATE DEFICIENCY   ibrutinib (IMBRUVICA) 280 MG tablet TAKE 1 TABLET (280 MG) BY MOUTH DAILY.   ipratropium (ATROVENT) 0.06 % nasal spray Use 1 to 2 sprays each nostril 2 to 3 x /day as needed   levocetirizine (XYZAL) 5 MG tablet TAKE 1 TABLET DAILY FOR ALLERGIES   olmesartan (BENICAR) 40 mg, Oral, Daily at  bedtime, For BP   rosuvastatin (CRESTOR) 40 MG tablet TAKE 1 TABLET EVERY DAY FOR CHOLESTEROL   triamcinolone cream (KENALOG) 0.1 % Apply  to skin  Rash   2 x /day   as needed   Vitamin D-3 5,000 Units, Oral, 2 times daily     Allergies  Allergen Reactions   Adalimumab Other (See Comments)    "blacked out", confused      PMHx:   Past Medical History:  Diagnosis Date   Allergy    seasonal allergies   Anemia    Anxiety    on meds   Arthritis    Chest pain, non-cardiac    COPD (chronic obstructive pulmonary disease) (HCC)    past smoker   Counseling regarding goals of care 03/27/2018   Depression    on meds   Diverticulitis    Diverticulosis    Fractured pelvis (HCC)    fall from ladder   GERD (gastroesophageal reflux disease)    on meds   Gout    History of shingles    Hyperlipidemia    on meds   Hypertension    on meds   Internal hemorrhoids    Iron deficiency anemia 03/13/2018   Kidney stones    Other abnormal glucose 10/11/2015   Rheumatoid arthritis (HCC)    hands   Waldenstrom's macroglobulinemia (HCC) 03/27/2018    Immunization History  Administered Date(s) Administered   DTaP  06/04/2011   Hepatitis B 11/26/2010   Hepatitis B 08/06/2011   Influenza Inj Mdck Quad  09/13/2017, 08/21/2018, 09/01/2020   Influenza Split 09/02/2014   Influenza  09/17/2013   Influenza 10/11/2015   Influenza 09/13/2017   PFIZER  SARS-COV-2 Vacc 01/24/2020, 03/06/2020, 08/03/2020   PPD Test 10/11/2015, 08/05/2019, 09/01/2020   Pneumococcal -23 12/06/2020    Past Surgical History:  Procedure Laterality Date   COLONOSCOPY  2016   JMP-suprep(exc)-mild TICS/int hems/normal-5 yr recall   FINGER SURGERY     iron infusion     KNEE SURGERY     right   PACEMAKER IMPLANT N/A 08/10/2020   Procedure: PACEMAKER IMPLANT;  Surgeon: Regan Lemming, MD;  Location: [facility];  Service: Cardiovascular;  Laterality: N/A;   ROTATOR CUFF REPAIR  1996   left   WISDOM TOOTH EXTRACTION      FHx:    Reviewed / unchanged  SHx:    Reviewed / unchanged   Systems Review:  Constitutional: Denies fever, chills, wt changes, headaches, insomnia, fatigue, night sweats, change in appetite. Eyes: Denies redness, blurred vision, diplopia, discharge, itchy, watery eyes.  ENT: Denies discharge, congestion, post nasal drip, epistaxis, sore throat, earache, hearing loss, dental pain, tinnitus, vertigo, sinus pain, snoring.  CV: Denies chest pain, palpitations, irregular heartbeat, syncope, dyspnea, diaphoresis, orthopnea, PND, claudication or edema. Respiratory: denies cough, dyspnea, DOE, pleurisy, hoarseness, laryngitis, wheezing.  Gastrointestinal: Denies dysphagia, odynophagia, heartburn, reflux, water brash, abdominal pain or cramps, nausea, vomiting, bloating, diarrhea, constipation, hematemesis, melena, hematochezia  or hemorrhoids. Genitourinary: Denies dysuria, frequency, urgency, nocturia, hesitancy, discharge, hematuria or flank pain. Musculoskeletal: Denies arthralgias, myalgias, stiffness, jt. swelling, pain, limping or strain/sprain.  Skin: Denies pruritus, rash, hives, warts, acne,  eczema or change in skin lesion(s). Neuro: No weakness, tremor, incoordination, spasms, paresthesia or pain. Psychiatric: Denies confusion, memory loss or sensory loss. Endo: Denies change in weight, skin or hair change.  Heme/Lymph: No excessive bleeding, bruising or enlarged lymph nodes.   Physical Exam  There were no vitals taken for this  visit.  Appears  well nourished, well groomed  and in no distress.  Eyes: PERRLA, EOMs, conjunctiva no swelling or erythema. Sinuses: No frontal/maxillary tenderness ENT/Mouth: EAC's clear, TM's nl w/o erythema, bulging. Nares clear w/o erythema, swelling, exudates. Oropharynx clear without erythema or exudates. Oral hygiene is good. Tongue normal, non obstructing. Hearing intact.  Neck: Supple. Thyroid not palpable. Car 2+/2+ without bruits, nodes or JVD. Chest: Respirations nl with BS clear & equal w/o rales, rhonchi, wheezing or stridor.  Cor: Heart sounds normal w/ regular rate and rhythm without sig. murmurs, gallops, clicks or rubs. Peripheral pulses normal and equal  without edema.  Abdomen: Soft & bowel sounds normal. Non-tender w/o guarding, rebound, hernias, masses or organomegaly.  Lymphatics: Unremarkable.  Musculoskeletal: Full ROM all peripheral extremities, joint stability, 5/5 strength and normal gait.  Skin: Warm, dry without exposed rashes, lesions or ecchymosis apparent.  Neuro: Cranial nerves intact, reflexes equal bilaterally. Sensory-motor testing grossly intact. Tendon reflexes grossly intact.  Pysch: Alert & oriented x 3.  Insight and judgement nl & appropriate. No ideations.  Assessment and Plan:  1. Essential hypertension  - Continue medication, monitor blood pressure at home.  - Continue DASH diet.  Reminder to go to the ER if any CP,  SOB, nausea, dizziness, severe HA, changes vision/speech.  - CBC with Differential/Platelet - COMPLETE METABOLIC PANEL WITH GFR - Magnesium - TSH  2. Hyperlipidemia, mixed  -  Continue diet/meds, exercise,& lifestyle modifications.  - Continue monitor periodic cholesterol/liver & renal functions   - Lipid panel - TSH  3. Glucose intolerance  - Continue diet, exercise  - Lifestyle modifications.  - Monitor appropriate labs  - Hemoglobin A1c - Insulin, random  4. Vitamin D deficiency  - Continue supplementation.  - VITAMIN D 25 Hydroxy   5. Waldenstrom's macroglobulinemia (HCC)  - COMPLETE METABOLIC PANEL WITH GFR  6. Presence of permanent cardiac pacemaker   7. Medication management  - CBC with Differential/Platelet - COMPLETE METABOLIC PANEL WITH GFR - Magnesium - Lipid panel - TSH - Hemoglobin A1c - Insulin, random - VITAMIN D 25 Hydroxy       Discussed  regular exercise, BP monitoring, weight control to achieve/maintain BMI less than 25 and discussed med and SE's. Recommended labs to assess and monitor clinical status with further disposition pending results of labs.  I discussed the assessment and treatment plan with the patient. The patient was provided an opportunity to ask questions and all were answered. The patient agreed with the plan and demonstrated an understanding of the instructions.  I provided over 30 minutes of exam, counseling, chart review and  complex critical decision making.         The patient was advised to call back or seek an in-person evaluation if the symptoms worsen or if the condition fails to improve as anticipated.   Marinus Maw, MD

## 2023-04-03 NOTE — Patient Instructions (Addendum)
Due to recent changes in healthcare laws, you may see the results of your imaging and laboratory studies on MyChart before your provider has had a chance to review them.  We understand that in some cases there may be results that are confusing or concerning to you. Not all laboratory results come back in the same time frame and the provider may be waiting for multiple results in order to interpret others.  Please give us 48 hours in order for your provider to thoroughly review all the results before contacting the office for clarification of your results.  ++++++++++++++++++++++++++  Vit D  & Vit C 1,000 mg   are recommended to help protect  against the Covid-19 and other Corona viruses.    Also it's recommended  to take  Zinc 50 mg  to help  protect against the Covid-19   and best place to get  is also on [employer]  and don't pay more than 6-8 cents /pill !   +++++++++++++++++++++++++++++++++++++++ Recommend Adult Low Dose Aspirin or  coated  Aspirin 81 mg daily  To reduce risk of Colon Cancer 40 %,  Skin Cancer 26 % ,  Melanoma 46%  and  Pancreatic cancer 60% +++++++++++++++++++++++++++++++++++++++++ Vitamin D goal  is between 70-100.  Please make sure that you are taking your Vitamin D as directed.  It is very important as a natural anti-inflammatory  helping hair, skin, and nails, as well as reducing stroke and heart attack risk.  It helps your bones and helps with mood. It also decreases numerous cancer risks so please take it as directed.  Low Vit D is associated with a 200-300% higher risk for CANCER  and 200-300% higher risk for HEART   ATTACK  &  STROKE.   ...................................... It is also associated with higher death rate at younger ages,  autoimmune diseases like Rheumatoid arthritis, Lupus, Multiple Sclerosis.    Also many other serious conditions, like depression, Alzheimer's Dementia, infertility, muscle aches, fatigue, fibromyalgia - just to name  a few. +++++++++++++++++++++++++++++++++++++++++ Recommend the book "The END of DIETING" by Dr Joel Fuhrman  & the book "The END of DIABETES " by Dr Joel Fuhrman At [employer] - get book & Audio CD's    Being diabetic has a  300% increased risk for heart attack, stroke, cancer, and alzheimer- type vascular dementia. It is very important that you work harder with diet by avoiding all foods that are white. Avoid white rice (Dudding & wild rice is OK), white potatoes (sweetpotatoes in moderation is OK), White bread or wheat bread or anything made out of white flour like bagels, donuts, rolls, buns, biscuits, cakes, pastries, cookies, pizza crust, and pasta (made from white flour & egg whites) - vegetarian pasta or spinach or wheat pasta is OK. Multigrain breads like Arnold's or Pepperidge Farm, or multigrain sandwich thins or flatbreads.  Diet, exercise and weight loss can reverse and cure diabetes in the early stages.  Diet, exercise and weight loss is very important in the control and prevention of complications of diabetes which affects every system in your body, ie. Brain - dementia/stroke, eyes - glaucoma/blindness, heart - heart attack/heart failure, kidneys - dialysis, stomach - gastric paralysis, intestines - malabsorption, nerves - severe painful neuritis, circulation - gangrene & loss of a leg(s), and finally cancer and Alzheimers.    I recommend avoid fried & greasy foods,  sweets/candy, white rice ([place] or wild rice or Quinoa is OK), white potatoes (sweet potatoes are OK) - anything   made from white flour - bagels, doughnuts, rolls, buns, biscuits,white and wheat breads, pizza crust and traditional pasta made of white flour & egg white(vegetarian pasta or spinach or wheat pasta is OK).  Multi-grain bread is OK - like multi-grain flat bread or sandwich thins. Avoid alcohol in excess. Exercise is also important.    Eat all the vegetables you want - avoid meat, especially red meat and dairy - especially  cheese.  Cheese is the most concentrated form of trans-fats which is the worst thing to clog up our arteries. Veggie cheese is OK which can be found in the fresh produce section at [employer] or [employer] or [employer]  +++++++++++++++++++++++++++++++++++++++ DASH Eating Plan  DASH stands for "Dietary Approaches to Stop Hypertension."   The DASH eating plan is a healthy eating plan that has been shown to reduce high blood pressure (hypertension). Additional health benefits may include reducing the risk of type 2 diabetes mellitus, heart disease, and stroke. The DASH eating plan may also help with weight loss. WHAT DO I NEED TO KNOW ABOUT THE DASH EATING PLAN? For the DASH eating plan, you will follow these general guidelines: Choose foods with a percent daily value for sodium of less than 5% (as listed on the food label). Use salt-free seasonings or herbs instead of table salt or sea salt. Check with your health care provider or pharmacist before using salt substitutes. Eat lower-sodium products, often labeled as "lower sodium" or "no salt added." Eat fresh foods. Eat more vegetables, fruits, and low-fat dairy products. Choose whole grains. Look for the word "whole" as the first word in the ingredient list. Choose fish  Limit sweets, desserts, sugars, and sugary drinks. Choose heart-healthy fats. Eat veggie cheese  Eat more home-cooked food and less restaurant, buffet, and fast food. Limit fried foods. Cook foods using methods other than frying. Limit canned vegetables. If you do use them, rinse them well to decrease the sodium. When eating at a restaurant, ask that your food be prepared with less salt, or no salt if possible.                      WHAT FOODS CAN I EAT? Read Dr Joel Fuhrman's books on The End of Dieting & The End of Diabetes  Grains Whole grain or whole wheat bread. Zehnder rice. Whole grain or whole wheat pasta. Quinoa, bulgur, and whole grain cereals. Low-sodium  cereals. Corn or whole wheat flour tortillas. Whole grain cornbread. Whole grain crackers. Low-sodium crackers.  Vegetables Fresh or frozen vegetables (raw, steamed, roasted, or grilled). Low-sodium or reduced-sodium tomato and vegetable juices. Low-sodium or reduced-sodium tomato sauce and paste. Low-sodium or reduced-sodium canned vegetables.   Fruits All fresh, canned (in natural juice), or frozen fruits.  Protein Products  All fish and seafood.  Dried beans, peas, or lentils. Unsalted nuts and seeds. Unsalted canned beans.  Dairy Low-fat dairy products, such as skim or 1% milk, 2% or reduced-fat cheeses, low-fat ricotta or cottage cheese, or plain low-fat yogurt. Low-sodium or reduced-sodium cheeses.  Fats and Oils Tub margarines without trans fats. Light or reduced-fat mayonnaise and salad dressings (reduced sodium). Avocado. Safflower, olive, or canola oils. Natural peanut or almond butter.  Other Unsalted popcorn and pretzels. The items listed above may not be a complete list of recommended foods or beverages. Contact your dietitian for more options.  +++++++++++++++  WHAT FOODS ARE NOT RECOMMENDED? Grains/ White flour or wheat flour White bread. White pasta. White rice. Refined   cornbread. Bagels and croissants. Crackers that contain trans fat.  Vegetables  Creamed or fried vegetables. Vegetables in a . Regular canned vegetables. Regular canned tomato sauce and paste. Regular tomato and vegetable juices.  Fruits Dried fruits. Canned fruit in light or heavy syrup. Fruit juice.  Meat and Other Protein Products Meat in general - RED meat & White meat.  Fatty cuts of meat. Ribs, chicken wings, all processed meats as bacon, sausage, bologna, salami, fatback, hot dogs, bratwurst and packaged luncheon meats.  Dairy Whole or 2% milk, cream, half-and-half, and cream cheese. Whole-fat or sweetened yogurt. Full-fat cheeses or blue cheese. Non-dairy creamers and whipped toppings.  Processed cheese, cheese spreads, or cheese curds.  Condiments Onion and garlic salt, seasoned salt, table salt, and sea salt. Canned and packaged gravies. Worcestershire sauce. Tartar sauce. Barbecue sauce. Teriyaki sauce. Soy sauce, including reduced sodium. Steak sauce. Fish sauce. Oyster sauce. Cocktail sauce. Horseradish. Ketchup and mustard. Meat flavorings and tenderizers. Bouillon cubes. Hot sauce. Tabasco sauce. Marinades. Taco seasonings. Relishes.  Fats and Oils Butter, stick margarine, lard, shortening and bacon fat. Coconut, palm kernel, or palm oils. Regular salad dressings.  Pickles and olives. Salted popcorn and pretzels.  The items listed above may not be a complete list of foods and beverages to avoid.  

## 2023-04-04 ENCOUNTER — Ambulatory Visit (INDEPENDENT_AMBULATORY_CARE_PROVIDER_SITE_OTHER): Payer: Medicare HMO | Admitting: Internal Medicine

## 2023-04-04 ENCOUNTER — Encounter: Payer: Self-pay | Admitting: Internal Medicine

## 2023-04-04 ENCOUNTER — Encounter: Payer: Self-pay | Admitting: Family

## 2023-04-04 VITALS — BP 160/80 | HR 62 | Temp 97.9°F | Resp 17 | Ht 69.0 in | Wt 166.2 lb

## 2023-04-04 DIAGNOSIS — Z950 Presence of cardiac pacemaker: Secondary | ICD-10-CM

## 2023-04-04 DIAGNOSIS — E782 Mixed hyperlipidemia: Secondary | ICD-10-CM

## 2023-04-04 DIAGNOSIS — Z79899 Other long term (current) drug therapy: Secondary | ICD-10-CM

## 2023-04-04 DIAGNOSIS — C880 Waldenstrom macroglobulinemia: Secondary | ICD-10-CM | POA: Diagnosis not present

## 2023-04-04 DIAGNOSIS — I10 Essential (primary) hypertension: Secondary | ICD-10-CM

## 2023-04-04 DIAGNOSIS — E559 Vitamin D deficiency, unspecified: Secondary | ICD-10-CM | POA: Diagnosis not present

## 2023-04-04 LAB — CBC WITH DIFFERENTIAL/PLATELET
Absolute Monocytes: 638 cells/uL (ref 200–950)
Platelets: 242 10*3/uL (ref 140–400)
RBC: 4.49 10*6/uL (ref 4.20–5.80)

## 2023-04-04 MED ORDER — BISOPROLOL FUMARATE 10 MG PO TABS
ORAL_TABLET | ORAL | 3 refills | Status: DC
Start: 2023-04-04 — End: 2024-03-10

## 2023-04-05 LAB — LIPID PANEL
Cholesterol: 194 mg/dL (ref <200)
HDL: 61 mg/dL (ref >=40)
LDL Cholesterol (Calc): 115 mg/dL (calc) — ABNORMAL HIGH
Non-HDL Cholesterol (Calc): 133 mg/dL (calc) — ABNORMAL HIGH (ref <130)
Total CHOL/HDL Ratio: 3.2 (calc) (ref <5.0)
Triglycerides: 85 mg/dL (ref <150)

## 2023-04-05 LAB — CBC WITH DIFFERENTIAL/PLATELET
Basophils Absolute: 38 cells/uL (ref 0–200)
Basophils Relative: 0.8 %
Eosinophils Absolute: 91 cells/uL (ref 15–500)
Eosinophils Relative: 1.9 %
HCT: 43.8 % (ref 38.5–50.0)
Hemoglobin: 14.3 g/dL (ref 13.2–17.1)
Lymphs Abs: 1176 cells/uL (ref 850–3900)
MCH: 31.8 pg (ref 27.0–33.0)
MCHC: 32.6 g/dL (ref 32.0–36.0)
MCV: 97.6 fL (ref 80.0–100.0)
MPV: 10.8 fL (ref 7.5–12.5)
Monocytes Relative: 13.3 %
Neutro Abs: 2856 cells/uL (ref 1500–7800)
Neutrophils Relative %: 59.5 %
RDW: 13.2 % (ref 11.0–15.0)
Total Lymphocyte: 24.5 %
WBC: 4.8 10*3/uL (ref 3.8–10.8)

## 2023-04-05 LAB — TSH: TSH: 1.77 mIU/L (ref 0.40–4.50)

## 2023-04-05 LAB — MAGNESIUM: Magnesium: 2.3 mg/dL (ref 1.5–2.5)

## 2023-04-05 LAB — COMPLETE METABOLIC PANEL WITH GFR
AG Ratio: 2 (calc) (ref 1.0–2.5)
ALT: 10 U/L (ref 9–46)
AST: 18 U/L (ref 10–35)
Albumin: 4.7 g/dL (ref 3.6–5.1)
Alkaline phosphatase (APISO): 72 U/L (ref 35–144)
BUN: 15 mg/dL (ref 7–25)
CO2: 28 mmol/L (ref 20–32)
Calcium: 9.9 mg/dL (ref 8.6–10.3)
Chloride: 106 mmol/L (ref 98–110)
Creat: 1.19 mg/dL (ref 0.70–1.35)
Globulin: 2.4 g/dL (calc) (ref 1.9–3.7)
Glucose, Bld: 91 mg/dL (ref 65–99)
Potassium: 4.4 mmol/L (ref 3.5–5.3)
Sodium: 144 mmol/L (ref 135–146)
Total Bilirubin: 0.6 mg/dL (ref 0.2–1.2)
Total Protein: 7.1 g/dL (ref 6.1–8.1)
eGFR: 68 mL/min/{1.73_m2} (ref >=60)

## 2023-04-05 LAB — HEMOGLOBIN A1C
Hgb A1c MFr Bld: 5.5 % of total Hgb (ref <5.7)
Mean Plasma Glucose: 111 mg/dL
eAG (mmol/L): 6.2 mmol/L

## 2023-04-05 LAB — INSULIN, RANDOM: Insulin: 24 u[IU]/mL — ABNORMAL HIGH

## 2023-04-05 LAB — VITAMIN D 25 HYDROXY (VIT D DEFICIENCY, FRACTURES): Vit D, 25-Hydroxy: 54 ng/mL (ref 30–100)

## 2023-04-07 NOTE — Progress Notes (Signed)
^<^<^<^<^<^<^<^<^<^<^<^<^<^<^<^<^<^<^<^<^<^<^<^<^<^<^<^<^<^<^<^<^<^<^<^<^ ^>^>^>^>^>^>^>^>^>^>^>>^>^>^>^>^>^>^>^>^>^>^>^>^>^>^>^>^>^>^>^>^>^>^>^>^>  -Test results slightly outside the reference range are not unusual. If there is anything important, I will review this with you,  otherwise it is considered normal test values.  If you have further questions,  please do not hesitate to contact me at the office or via My Chart.   ^<^<^<^<^<^<^<^<^<^<^<^<^<^<^<^<^<^<^<^<^<^<^<^<^<^<^<^<^<^<^<^<^<^<^<^<^ ^>^>^>^>^>^>^>^>^>^>^>^>^>^>^>^>^>^>^>^>^>^>^>^>^>^>^>^>^>^>^>^>^>^>^>^>^  -    -  Total  Chol =   194    - Elevated             (  Ideal  or  Goal is less than 180  !  )  & -  Bad / Dangerous LDL  Chol =  115   -  is VERY Elevated              (  Ideal  or  Goal is less than 70  !  )   - Cholesterol is too high - Recommend low cholesterol diet   - Cholesterol only comes from animal sources                                                                                             - ie. meat, dairy, egg yolks - Eat all the vegetables you want.  - Avoid Meat, Avoid Meat,  Avoid Meat                                                                         - especially Red Meat - Beef AND Pork .  - Avoid cheese & dairy - milk & ice cream.     - Cheese is the most concentrated form of trans-fats which                                                                       is the worst thing to clog up our arteries.  ^>^>^>^>^>^>^>^>^>^>^>^>^>^>^>^>^>^>^>^>^>^>^>^>^>^>^>^>^>^>^>^>^>^>^>^>^  -   CBC- is Normal  - Great !  ^>^>^>^>^>^>^>^>^>^>^>^>^>^>^>^>^>^>^>^>^>^>^>^>^>^>^>^>^>^>^>^>^>^>^>^>^  -   A1c - Normal  - No Diabetes  - Great          !  ^>^>^>^>^>^>^>^>^>^>^>^>^>^>^>^>^>^>^>^>^>^>^>^>^>^>^>^>^>^>^>^>^>^>^>^>^  -   Vitamin D = 54  -  is a little low    - Vitamin D goal is between 70-100.   - Please make sure that you are taking your Vitamin D 10,000 units /day as directed.   -  It is very important as a natural anti-inflammatory and helping the  immune system protect against viral infections, like the Covid-19    helping hair, skin, and nails, as well as reducing stroke and heart attack risk.   - It helps your bones and helps with mood.  - It also decreases numerous cancer risks so please                                                                                                              take it as directed.   - Low Vit D is associated with a 200-300% higher risk for CANCER                                           and 200-300% higher risk for HEART   ATTACK  &  STROKE.    - It is also associated with higher death rate at younger ages,   autoimmune diseases like Rheumatoid arthritis, Lupus, Multiple Sclerosis.     - Also many other serious conditions, like depression, Alzheimer's                                                  Dementia, infertility, muscle aches, fatigue, fibromyalgia   ^>^>^>^>^>^>^>^>^>^>^>^>^>^>^>^>^>^>^>^>^>^>^>^>^>^>^>^>^>^>^>^>^>^>^>^>^  -   All Else - CBC - Kidneys - Electrolytes - Liver - Magnesium & Thyroid    - all  Normal / OK ^>^>^>^>^>^>^>^>^>^>^>^>^>^>^>^>^>^>^>^>^>^>^>^>^>^>^>^>^>^>^>^>^>^>^>^>^ ^>^>^>^>^>^>^>^>^>^>^>^>^>^>^>^>^>^>^>^>^>^>^>^>^>^>^>^>^>^>^>^>^>^>^>^>^  -   Keep up the Great Work     !  ^>^>^>^>^>^>^>^>^>^>^>^>^>^>^>^>^>^>^>^>^>^>^>^>^>^>^>^>^>^>^>^>^>^>^>^>^ ^>^>^>^>^>^>^>^>^>^>^>^>^>^>^>^>^>^>^>^>^>^>^>^>^>^>^>^>^>^>^>^>^>^>^>^>^               - Veggie cheese is OK which can be found in the fresh  produce section at [employer] or [employer] or [employer]

## 2023-05-09 ENCOUNTER — Ambulatory Visit (INDEPENDENT_AMBULATORY_CARE_PROVIDER_SITE_OTHER): Payer: Medicare HMO

## 2023-05-09 DIAGNOSIS — I495 Sick sinus syndrome: Secondary | ICD-10-CM

## 2023-05-09 LAB — CUP PACEART REMOTE DEVICE CHECK
Battery Remaining Longevity: 98 mo
Battery Remaining Percentage: 78 %
Battery Voltage: 3.01 V
Brady Statistic AP VP Percent: 1 %
Brady Statistic AP VS Percent: 73 %
Brady Statistic AS VP Percent: 1 %
Brady Statistic AS VS Percent: 26 %
Brady Statistic RA Percent Paced: 73 %
Brady Statistic RV Percent Paced: 1 %
Date Time Interrogation Session: 20240613020014
Implantable Lead Connection Status: 753985
Implantable Lead Connection Status: 753985
Implantable Lead Implant Date: 20210915
Implantable Lead Implant Date: 20210915
Implantable Lead Location: 753859
Implantable Lead Location: 753860
Implantable Pulse Generator Implant Date: 20210915
Lead Channel Impedance Value: 410 Ohm
Lead Channel Impedance Value: 580 Ohm
Lead Channel Pacing Threshold Amplitude: 0.5 V
Lead Channel Pacing Threshold Amplitude: 0.625 V
Lead Channel Pacing Threshold Pulse Width: 0.5 ms
Lead Channel Pacing Threshold Pulse Width: 0.5 ms
Lead Channel Sensing Intrinsic Amplitude: 1.9 mV
Lead Channel Sensing Intrinsic Amplitude: 8.6 mV
Lead Channel Setting Pacing Amplitude: 0.875
Lead Channel Setting Pacing Amplitude: 1.5 V
Lead Channel Setting Pacing Pulse Width: 0.5 ms
Lead Channel Setting Sensing Sensitivity: 2 mV
Pulse Gen Model: 2272
Pulse Gen Serial Number: 3864545

## 2023-05-29 NOTE — Progress Notes (Signed)
Remote pacemaker transmission.   

## 2023-05-31 ENCOUNTER — Encounter (HOSPITAL_BASED_OUTPATIENT_CLINIC_OR_DEPARTMENT_OTHER): Payer: Self-pay

## 2023-05-31 ENCOUNTER — Other Ambulatory Visit: Payer: Self-pay

## 2023-05-31 ENCOUNTER — Emergency Department (HOSPITAL_BASED_OUTPATIENT_CLINIC_OR_DEPARTMENT_OTHER)
Admission: EM | Admit: 2023-05-31 | Discharge: 2023-05-31 | Disposition: A | Discharge status: Home / Self Care | Payer: Medicare HMO | Source: Home / Self Care | Attending: Emergency Medicine | Admitting: Emergency Medicine

## 2023-05-31 ENCOUNTER — Emergency Department (HOSPITAL_BASED_OUTPATIENT_CLINIC_OR_DEPARTMENT_OTHER): Payer: Medicare HMO

## 2023-05-31 DIAGNOSIS — L237 Allergic contact dermatitis due to plants, except food: Secondary | ICD-10-CM

## 2023-05-31 DIAGNOSIS — Z7982 Long term (current) use of aspirin: Secondary | ICD-10-CM | POA: Insufficient documentation

## 2023-05-31 DIAGNOSIS — M549 Dorsalgia, unspecified: Secondary | ICD-10-CM

## 2023-05-31 DIAGNOSIS — M546 Pain in thoracic spine: Secondary | ICD-10-CM | POA: Diagnosis present

## 2023-05-31 MED ORDER — BETAMETHASONE DIPROPIONATE 0.05 % EX OINT: TOPICAL_OINTMENT | Freq: Two times a day (BID) | CUTANEOUS | 0 refills | Status: DC

## 2023-05-31 MED ORDER — OXYCODONE-ACETAMINOPHEN 5-325 MG PO TABS
1.0000 | ORAL_TABLET | Freq: Once | ORAL | Status: AC
  Administered 2023-05-31: 1 via ORAL
  Filled 2023-05-31: qty 1

## 2023-05-31 NOTE — Discharge Instructions (Signed)
You were seen in the emergency department for worsening mid back pain.  You had a CAT scan that showed your unchanged compression fracture.  We are prescribing a short course of pain medication, please use caution as this may make you dizzy and constipated.  We are also prescribing a topical steroid for your poison oak.  Follow-up with your regular doctor.  Return to the emergency department if any worsening or concerning symptoms.

## 2023-05-31 NOTE — ED Triage Notes (Signed)
Patient was working on his pump for his pool and was bending over on Friday. When he stood up he started having lower back pain. He stated he has collapsed T12-L1 vertebrae in his back.

## 2023-05-31 NOTE — ED Provider Notes (Signed)
[place] [facility] AT [facility] Provider Note   CSN: [id number] Arrival date & time: 05/31/23  1124     History {Add pertinent medical, surgical, social history, OB history to HPI:1} Chief Complaint  Patient presents with   Back Pain    Edwin Webster is a 62 y.o. male.  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  He has some chronic back pain and known T12 and L1 compression fractures.  He said he was working on a pump for the pool when he stood up he had severe pain in his mid back.  It has been there since then.  This occurred a few days ago.  No numbness or weakness no abdominal pain no fevers no bowel or bladder incontinence.  Follows with Dr. Ophelia Charter orthopedics  The history is provided by the patient.  Back Pain Location:  Thoracic spine and lumbar spine Quality:  Stabbing Pain severity:  Severe Pain is:  Same all the time Onset quality:  Sudden Duration:  1 week Timing:  Constant Progression:  Worsening Chronicity:  New Relieved by:  Nothing Worsened by:  Bending and movement Ineffective treatments:  Muscle relaxants and OTC medications Associated symptoms: no abdominal pain, no bladder incontinence, no bowel incontinence, no chest pain, no dysuria, no fever, no numbness and no weakness        Home Medications Prior to Admission medications   Medication Sig Start Date End Date Taking? Authorizing Provider  aspirin EC 81 MG tablet Take 81 mg daily by mouth.    [provider]  baclofen (LIORESAL) 10 MG tablet TAKE 1 /2 TO 1 TABLET 2 X /DAY IF NEEDED FOR MUSCLE SPASM 08/22/22   Raynelle Dick, NP  bisoprolol (ZEBETA) 10 MG tablet Take   1 tablet  every Morning  for BP 04/04/23   Lucky Cowboy, MD  busPIRone (BUSPAR) 7.5 MG tablet Take 7.5 mg by mouth 3 (three) times daily. 06/08/22   [provider]  Cholecalciferol (VITAMIN D-3) 5000 UNITS TABS Take 5,000 Units by mouth 2  (two) times daily.     [provider]  escitalopram (LEXAPRO) 20 MG tablet Take 20 mg by mouth daily.    [provider]  ezetimibe (ZETIA) 10 MG tablet Take 1 tablet (10 mg total) by mouth daily. Please keep scheduled appointment 12/07/22   Regan Lemming, MD  famciclovir [facility]) 500 MG tablet TAKE 1 TABLET BY MOUTH EVERY DAY 08/28/21   [name], MD  folic acid (FOLVITE) 1 MG tablet TAKE 1 TABLET DAILY FOR FOLATE DEFICIENCY 06/01/22   Judd Gaudier, NP  ibrutinib (IMBRUVICA) 280 MG tablet TAKE 1 TABLET (280 MG) BY MOUTH DAILY. 10/08/22 10/08/23  [name], MD  ipratropium (ATROVENT) 0.06 % nasal spray Use 1 to 2 sprays each nostril 2 to 3 x /day as needed 02/28/21   Lucky Cowboy, MD  levocetirizine (XYZAL) 5 MG tablet TAKE 1 TABLET BY MOUTH DAILY FOR ALLERGIES 02/08/23   Raynelle Dick, NP  olmesartan (BENICAR) 40 MG tablet Take 1 tablet (40 mg total) by mouth at bedtime. For BP 12/06/21   Judd Gaudier, NP  promethazine-dextromethorphan (PROMETHAZINE-DM) 6.25-15 MG/5ML syrup Take 5 mLs by mouth 4 (four) times daily as needed for cough. Patient not taking: Reported on 04/04/2023 08/20/22   Adela Glimpse, NP  rosuvastatin (CRESTOR) 40 MG tablet TAKE 1 TABLET BY MOUTH EVERY DAY FOR CHOLESTEROL 11/27/22   Raynelle Dick, NP  triamcinolone cream (KENALOG) 0.1 % Apply  to skin  Rash   2 x /day   as needed 03/20/23   Lucky Cowboy, MD  vitamin B-12 (CYANOCOBALAMIN) 500 MCG tablet Take 500 mcg by mouth daily.    [provider]      Allergies    Adalimumab    Review of Systems   Review of Systems  Constitutional:  Negative for fever.  Respiratory:  Negative for shortness of breath.   Cardiovascular:  Negative for chest pain.  Gastrointestinal:  Negative for abdominal pain and bowel incontinence.  Genitourinary:  Negative for bladder incontinence and dysuria.  Musculoskeletal:  Positive for back pain.  Neurological:  Negative for weakness and  numbness.    Physical Exam Updated Vital Signs BP (!) 163/93   Pulse 63   Temp 98.1 F (36.7 C) (Oral)   Resp 18   Ht 5\' 9"  (1.753 m)   Wt 72.6 kg   SpO2 96%   BMI 23.63 kg/m  Physical Exam Vitals and nursing note reviewed.  Constitutional:      General: He is not in acute distress.    Appearance: Normal appearance. He is well-developed.  HENT:     Head: Normocephalic and atraumatic.  Eyes:     Conjunctiva/sclera: Conjunctivae normal.  Cardiovascular:     Rate and Rhythm: Normal rate and regular rhythm.     Heart sounds: No murmur heard. Pulmonary:     Effort: Pulmonary effort is normal. No respiratory distress.     Breath sounds: Normal breath sounds.  Abdominal:     Palpations: Abdomen is soft.     Tenderness: There is no abdominal tenderness. There is no guarding or rebound.  Musculoskeletal:        General: Tenderness present.     Cervical back: Neck supple.     Comments: Has diffuse tenderness in his mid and lower thoracic spine and lumbar spine.  No step-offs.  No overlying skin findings.  Skin:    General: Skin is warm and dry.     Capillary Refill: Capillary refill takes less than 2 seconds.  Neurological:     General: No focal deficit present.     Mental Status: He is alert.     Motor: No weakness.     Gait: Gait normal.     ED Results / Procedures / Treatments   Labs (all labs ordered are listed, but only abnormal results are displayed) Labs Reviewed - No data to display  EKG None  Radiology No results found.  Procedures Procedures  {Document cardiac monitor, telemetry assessment procedure when appropriate:1}  Medications Ordered in ED Medications  oxyCODONE-acetaminophen (PERCOCET/ROXICET) 5-325 MG per tablet 1 tablet (has no administration in time range)    ED Course/ Medical Decision Making/ A&P   {   Click here for ABCD2, HEART and other calculatorsREFRESH Note before signing :1}                          Medical Decision  Making Amount and/or Complexity of Data Reviewed Radiology: ordered.  Risk Prescription drug management.   This patient complains of ***; this involves an extensive number of treatment Options and is a complaint that carries with it a high risk of complications and morbidity. The differential includes ***  I ordered, reviewed and interpreted labs, which included *** I ordered medication *** and reviewed PMP when indicated. I ordered imaging studies which included *** and I independently  visualized and interpreted imaging which showed *** Additional history obtained from *** Previous records obtained and reviewed *** I consulted *** and discussed lab and imaging findings and discussed disposition.  Cardiac monitoring reviewed, *** Social determinants considered, *** Critical Interventions: ***  After the interventions stated above, I reevaluated the patient and found *** Admission and further testing considered, ***   {Document critical care time when appropriate:1} {Document review of labs and clinical decision tools ie heart score, Chads2Vasc2 etc:1}  {Document your independent review of radiology images, and any outside records:1} {Document your discussion with family members, caretakers, and with consultants:1} {Document social determinants of health affecting pt's care:1} {Document your decision making why or why not admission, treatments were needed:1} Final Clinical Impression(s) / ED Diagnoses Final diagnoses:  None    Rx / DC Orders ED Discharge Orders     None

## 2023-06-01 ENCOUNTER — Telehealth (HOSPITAL_BASED_OUTPATIENT_CLINIC_OR_DEPARTMENT_OTHER): Payer: Self-pay | Admitting: Emergency Medicine

## 2023-06-01 ENCOUNTER — Other Ambulatory Visit: Payer: Self-pay | Admitting: Internal Medicine

## 2023-06-01 MED ORDER — OXYCODONE-ACETAMINOPHEN 5-325 MG PO TABS: 1.0000 | ORAL_TABLET | Freq: Four times a day (QID) | ORAL | 0 refills | Status: DC | PRN

## 2023-06-01 NOTE — Telephone Encounter (Signed)
Forgot to prescribe pain medication

## 2023-06-03 ENCOUNTER — Encounter: Payer: Self-pay | Admitting: Nurse Practitioner

## 2023-06-03 ENCOUNTER — Other Ambulatory Visit: Payer: Self-pay

## 2023-06-03 ENCOUNTER — Ambulatory Visit (INDEPENDENT_AMBULATORY_CARE_PROVIDER_SITE_OTHER): Payer: Medicare HMO | Admitting: Nurse Practitioner

## 2023-06-03 VITALS — BP 132/80 | HR 81 | Temp 97.9°F | Ht 69.0 in | Wt 161.8 lb

## 2023-06-03 DIAGNOSIS — I10 Essential (primary) hypertension: Secondary | ICD-10-CM | POA: Diagnosis not present

## 2023-06-03 DIAGNOSIS — L237 Allergic contact dermatitis due to plants, except food: Secondary | ICD-10-CM | POA: Diagnosis not present

## 2023-06-03 DIAGNOSIS — S22080S Wedge compression fracture of T11-T12 vertebra, sequela: Secondary | ICD-10-CM

## 2023-06-03 MED ORDER — DEXAMETHASONE SODIUM PHOSPHATE 10 MG/ML IJ SOLN
10.0000 mg | Freq: Once | INTRAMUSCULAR | Status: AC
Start: 2023-06-03 — End: 2023-06-03
  Administered 2023-06-03: 10 mg via INTRAMUSCULAR

## 2023-06-03 MED ORDER — OLMESARTAN MEDOXOMIL 40 MG PO TABS: 40.0000 mg | ORAL_TABLET | Freq: Every day | ORAL | 3 refills | Status: DC

## 2023-06-03 MED ORDER — PREDNISONE 20 MG PO TABS
ORAL_TABLET | ORAL | 0 refills | Status: AC
Start: 2023-06-03 — End: 2023-06-14

## 2023-06-03 MED ORDER — FOLIC ACID 1 MG PO TABS: ORAL_TABLET | ORAL | 3 refills | Status: AC

## 2023-06-03 NOTE — Progress Notes (Signed)
Assessment and Plan:   Aimar was seen today for poison ivy.  Diagnoses and all orders for this visit:  Essential hypertension - continue medications, DASH diet, exercise and monitor at home. Call if greater than 130/80.    Poison oak dermatitis Benadryl gel Dexamethasone injection today Start Prednisone taper tomorrow If no improvement by Friday notify the office.  If e=develop selling tongue, difficulty swallowing or breathing go to the ER -     dexamethasone (DECADRON) injection 10 mg -     predniSONE (DELTASONE) 20 MG tablet; 3 tablets daily with food for 3 days, 2 tabs daily for 3 days, 1 tab a day for 5 days.  Compression fracture of T12 vertebra, sequela Continue course of treatment from ER Follow up with ortho if no improvement  Waldenstrom's macroglobulinemia (HCC) Continue medication and follow with Dr. Myna Hidalgo  Further disposition pending results of labs. Discussed med's effects and SE's.   Over 30 minutes of exam, counseling, chart review, and critical decision making was performed.   Future Appointments  Date Time Provider Department Center  06/18/2023 10:00 AM [facility] None  06/18/2023 10:30 AM Ennever, Rose Phi, MD [facility] None  07/25/2023 10:30 AM Adela Glimpse, NP [facility] None  08/08/2023  7:10 AM [facility] DEVICE REMOTES [facility]  10/28/2023 10:00 AM Adela Glimpse, NP [facility] None  11/07/2023  7:10 AM [facility] DEVICE REMOTES [facility]  02/06/2024  7:10 AM [facility] DEVICE REMOTES [facility]  05/07/2024  7:10 AM [facility] DEVICE REMOTES [facility]  08/06/2024  7:10 AM [facility] DEVICE REMOTES [facility]  11/05/2024  7:10 AM [facility] DEVICE REMOTES [facility]    ------------------------------------------------------------------------------------------------------------------   HPI BP 132/80   Pulse 81   Temp 97.9 F (36.6 C)   Ht 5\' 9"   (1.753 m)   Wt 161 lb 12.8 oz (73.4 kg)   SpO2 95%   BMI 23.89 kg/m   62 y.o.male presents for possible poison oak. Has noticed the areas on his arms and legs x 3 days.  He was trimming the natural area and had gotten into poison ivy or poison oak. Itchy and painful. No areas on face or groin. They gave him Diprolene ointment at that ER no results.    He was working on pool and had ot clean salt water part.  Stop up and felt hot poker through his back. He has compression fractures of T12 and L1- He was told to rest and given Oxycodone - new CT shows no new fractures.   Currently on Bisoprolol 10 mg every day and Olmesartan 40 mg every day for BP control.  Today BP is: BP Readings from Last 3 Encounters:  06/03/23 132/80  05/31/23 (!) 154/80  04/04/23 (!) 160/80   BMI is Body mass index is 23.89 kg/m., he has been working on diet and exercise. Wt Readings from Last 3 Encounters:  06/03/23 161 lb 12.8 oz (73.4 kg)  05/31/23 160 lb (72.6 kg)  04/04/23 166 lb 3.2 oz (75.4 kg)   He does have Waldenstrom's macroglobulinemia and is followed by Dr. Myna Hidalgo. Appears to be stable on Imbruvica. Last visit was 03/18/23.   Past Medical History:  Diagnosis Date   Allergy    seasonal allergies   Anemia    Anxiety    on meds   Arthritis    Chest pain, non-cardiac    COPD (chronic obstructive pulmonary disease) (HCC)    past smoker   Counseling regarding goals of care 03/27/2018   Depression  on meds   Diverticulitis    Diverticulosis    Fractured pelvis (HCC)    fall from ladder   GERD (gastroesophageal reflux disease)    on meds   Gout    History of shingles    Hyperlipidemia    on meds   Hypertension    on meds   Internal hemorrhoids    Iron deficiency anemia 03/13/2018   Kidney stones    Osteoporosis    Other abnormal glucose 10/11/2015   Rheumatoid arthritis (HCC)    hands   Waldenstrom's macroglobulinemia (HCC) 03/27/2018     Allergies  Allergen Reactions    Adalimumab Other (See Comments)    "blacked out", confused     Current Outpatient Medications on File Prior to Visit  Medication Sig   aspirin EC 81 MG tablet Take 81 mg daily by mouth.   baclofen (LIORESAL) 10 MG tablet TAKE 1 /2 TO 1 TABLET 2 X /DAY IF NEEDED FOR MUSCLE SPASM   betamethasone dipropionate (DIPROLENE) 0.05 % ointment Apply topically 2 (two) times daily.   bisoprolol (ZEBETA) 10 MG tablet Take   1 tablet  every Morning  for BP   busPIRone (BUSPAR) 7.5 MG tablet TAKE 1 TABLET 3 X /DAY FOR CHRONIC ANXIETY   Cholecalciferol (VITAMIN D-3) 5000 UNITS TABS Take 5,000 Units by mouth 2 (two) times daily.    escitalopram (LEXAPRO) 20 MG tablet Take 20 mg by mouth daily.   ezetimibe (ZETIA) 10 MG tablet Take 1 tablet (10 mg total) by mouth daily. Please keep scheduled appointment   famciclovir (FAMVIR) 500 MG tablet TAKE 1 TABLET BY MOUTH EVERY DAY   ibrutinib (IMBRUVICA) 280 MG tablet TAKE 1 TABLET (280 MG) BY MOUTH DAILY.   ipratropium (ATROVENT) 0.06 % nasal spray Use 1 to 2 sprays each nostril 2 to 3 x /day as needed   levocetirizine (XYZAL) 5 MG tablet TAKE 1 TABLET BY MOUTH DAILY FOR ALLERGIES   oxyCODONE-acetaminophen (PERCOCET/ROXICET) 5-325 MG tablet Take 1 tablet by mouth every 6 (six) hours as needed for severe pain.   rosuvastatin (CRESTOR) 40 MG tablet TAKE 1 TABLET BY MOUTH EVERY DAY FOR CHOLESTEROL   triamcinolone cream (KENALOG) 0.1 % Apply  to skin  Rash   2 x /day   as needed   vitamin B-12 (CYANOCOBALAMIN) 500 MCG tablet Take 500 mcg by mouth daily.   No current facility-administered medications on file prior to visit.    ROS: all negative except above.   Physical Exam:  BP 132/80   Pulse 81   Temp 97.9 F (36.6 C)   Ht 5\' 9"  (1.753 m)   Wt 161 lb 12.8 oz (73.4 kg)   SpO2 95%   BMI 23.89 kg/m   General Appearance: Well nourished, in no apparent distress. Eyes: PERRLA, EOMs, conjunctiva no swelling or erythema Sinuses: No Frontal/maxillary  tenderness ENT/Mouth: Ext aud canals clear, TMs without erythema, bulging. No erythema, swelling, or exudate on post pharynx. Marland Kitchen Hearing normal.  Neck: Supple, thyroid normal.  Respiratory: Respiratory effort normal, BS equal bilaterally without rales, rhonchi, wheezing or stridor.  Cardio: RRR with no MRGs. Brisk peripheral pulses without edema.  Abdomen: Soft, + BS.  Non tender, no guarding, rebound, hernias, masses. Lymphatics: Non tender without lymphadenopathy.  Musculoskeletal: Full ROM, 5/5 strength. Antalgic gait due to back pain Skin: Warm, dry . Large poison oak patch of left knee, smaller patch on right forearm and upper arm Neuro: Cranial nerves intact. Normal muscle tone, no cerebellar  symptoms. Sensation intact.  Psych: Awake and oriented X 3, normal affect, Insight and Judgment appropriate.     Raynelle Dick, NP 3:08 PM [facility]

## 2023-06-03 NOTE — Patient Instructions (Signed)
Benadryl gel Dexamethasone injection today  Start Prednisone taper tomorrow  Poison Oak Dermatitis  Poison oak dermatitis is inflammation of the skin that is caused by contact with the chemicals in the leaves of the poison oak (Toxicodendron) plant. The skin reaction often includes redness, swelling, blisters, and extreme itching. What are the causes? This condition is caused by a specific chemical (urushiol) that is found in the sap of the poison oak plant. This chemical is sticky and can be easily spread to people, animals, and objects. You can get poison oak dermatitis by: Having direct contact with a poison oak plant. Touching animals, other people, or objects that have come in contact with poison oak and have the chemical on them. What increases the risk? This condition is more likely to develop in people who: Are outdoors often in wooded or [place] areas. Go outdoors without wearing protective clothing, such as closed shoes, long pants, and a long-sleeved shirt. What are the signs or symptoms? Symptoms of this condition include: Redness of the skin. Extreme itching. A rash that often includes bumps and blisters. The rash usually appears 48 hours after exposure if you have been exposed before. If this is the first time you have been exposed, the rash may not appear until a week after exposure. Swelling. This may occur if the reaction is more severe. Symptoms usually last for 1-2 weeks. However, the first time you develop this condition, symptoms may last 3-4 weeks. How is this diagnosed? This condition may be diagnosed based on your symptoms and a physical exam. Your health care provider may also ask you about any recent outdoor activity. How is this treated? Treatment for this condition will vary depending on how severe it is. Treatment may include: Hydrocortisone creams or calamine lotions to relieve itching. Oatmeal baths to soothe the skin. Over-the-counter antihistamine  medicines to help reduce itching. Steroid medicine taken by mouth (orally) for more severe reactions. Follow these instructions at home: Medicines Take or apply over-the-counter and prescription medicines only as told by your health care provider. Use hydrocortisone creams or calamine lotion as needed to soothe the skin and relieve itching. General instructions Do not scratch or rub your skin. Apply a cold, wet cloth (cold compress) to the affected areas or take baths in cool water. This will help with itching. Avoid hot baths and showers. Take oatmeal baths as needed. Use colloidal oatmeal. You can get this at your local pharmacy or grocery store. Follow the instructions on the packaging. Wash clothes, bedsheets, towels, and blankets that you wore or came in contact with between your exposure to the plant and the appearance of your rash. The oils can remain on these items and continue to cause new exposure. Check the affected area every day for signs of infection. Check for: More redness, swelling, or pain. Fluid or blood. Warmth. Pus or a bad smell. Keep all follow-up visits. Your health care provider may want to see how your skin is progressing with treatment. How is this prevented?  Learn to identify the poison oak plant and avoid contact with the plant. This plant can be recognized by the number of leaves. Generally, poison oak has three leaves with flowering branches on a single stem. The leaves are often a bit fuzzy and have a toothlike edge. If you have been exposed to poison oak, thoroughly wash your skin with soap and water right away. You have about 30 minutes to remove the plant resin before it will cause the rash. Be  sure to wash under your fingernails because any plant resin there will continue to spread the rash. When hiking or camping, wear clothes that will help you avoid exposure on the skin. This includes long pants, a long-sleeved shirt, long socks, and hiking boots. You can  also apply preventive lotion to your skin to help limit exposure. If you suspect that your clothes or outdoor gear came in contact with poison oak, rinse them off outside with a garden hose before bringing them inside your house. When doing yard work or gardening, wear gloves, long sleeves, long pants, and boots. Wash your garden tools and gloves if they come in contact with poison oak. If you suspect that your pet has come into contact with poison oak, wash them with pet shampoo and water. Make sure you wear gloves while washing your pet. Do not burn poison oak plants. This can release the chemical from the plant into the air and may cause a reaction on the skin or eyes, or in the lungs from breathing in the smoke. Contact a health care provider if: You have open sores in the rash area. You have any signs of infection. You have redness that spreads beyond the rash area. You have a fever. You have a rash over a large area of your body. You have a rash on your eyes, mouth, or genitals. You have a rash that does not improve after a few weeks. Get help right away if: Your face swells or your eyes swell shut. You have trouble breathing. You have trouble swallowing. These symptoms may be an emergency. Get help right away. Call 911. Do not wait to see if the symptoms will go away. Do not drive yourself to the hospital. This information is not intended to replace advice given to you by your health care provider. Make sure you discuss any questions you have with your health care provider. Document Revised: 05/23/2022 Document Reviewed: 04/12/2022 Elsevier Patient Education  2024 [employer].

## 2023-06-03 NOTE — Addendum Note (Signed)
Addended by: Dionicio Stall on: 06/03/2023 12:00 PM   Modules accepted: Orders

## 2023-06-13 ENCOUNTER — Telehealth: Payer: Self-pay | Admitting: Nurse Practitioner

## 2023-06-13 ENCOUNTER — Other Ambulatory Visit: Payer: Self-pay | Admitting: Nurse Practitioner

## 2023-06-13 DIAGNOSIS — S22080S Wedge compression fracture of T11-T12 vertebra, sequela: Secondary | ICD-10-CM

## 2023-06-13 NOTE — Telephone Encounter (Signed)
Pt is looking get a referral to [facility]. He would like to see Dr. Dutch Quint.

## 2023-06-13 NOTE — Telephone Encounter (Signed)
I have placed the referral

## 2023-06-18 ENCOUNTER — Inpatient Hospital Stay: Payer: Medicare HMO | Attending: Hematology & Oncology

## 2023-06-18 ENCOUNTER — Encounter: Payer: Self-pay | Admitting: Hematology & Oncology

## 2023-06-18 ENCOUNTER — Inpatient Hospital Stay (HOSPITAL_BASED_OUTPATIENT_CLINIC_OR_DEPARTMENT_OTHER): Payer: Medicare HMO | Admitting: Hematology & Oncology

## 2023-06-18 VITALS — BP 159/81 | HR 61 | Temp 98.0°F | Resp 20 | Ht 69.0 in | Wt 159.1 lb

## 2023-06-18 DIAGNOSIS — C880 Waldenstrom macroglobulinemia: Secondary | ICD-10-CM | POA: Diagnosis not present

## 2023-06-18 DIAGNOSIS — D500 Iron deficiency anemia secondary to blood loss (chronic): Secondary | ICD-10-CM

## 2023-06-18 DIAGNOSIS — D509 Iron deficiency anemia, unspecified: Secondary | ICD-10-CM | POA: Insufficient documentation

## 2023-06-18 LAB — CMP (CANCER CENTER ONLY)
ALT: 8 U/L (ref 0–44)
AST: 14 U/L — ABNORMAL LOW (ref 15–41)
Albumin: 4.3 g/dL (ref 3.5–5.0)
Alkaline Phosphatase: 81 U/L (ref 38–126)
Anion gap: 8 (ref 5–15)
BUN: 19 mg/dL (ref 8–23)
CO2: 23 mmol/L (ref 22–32)
Calcium: 9.3 mg/dL (ref 8.9–10.3)
Chloride: 106 mmol/L (ref 98–111)
Creatinine: 1.14 mg/dL (ref 0.61–1.24)
GFR, Estimated: >60 mL/min (ref >60)
Glucose, Bld: 130 mg/dL — ABNORMAL HIGH (ref 70–99)
Potassium: 3.8 mmol/L (ref 3.5–5.1)
Sodium: 137 mmol/L (ref 135–145)
Total Bilirubin: 0.8 mg/dL (ref 0.3–1.2)
Total Protein: 6.6 g/dL (ref 6.5–8.1)

## 2023-06-18 LAB — CBC WITH DIFFERENTIAL (CANCER CENTER ONLY)
Abs Immature Granulocytes: 0.02 10*3/uL (ref 0.00–0.07)
Basophils Absolute: 0 10*3/uL (ref 0.0–0.1)
Basophils Relative: 1 %
Eosinophils Absolute: 0.1 10*3/uL (ref 0.0–0.5)
Eosinophils Relative: 1 %
HCT: 39.6 % (ref 39.0–52.0)
Hemoglobin: 13.5 g/dL (ref 13.0–17.0)
Immature Granulocytes: 0 %
Lymphocytes Relative: 17 %
Lymphs Abs: 0.8 10*3/uL (ref 0.7–4.0)
MCH: 32.4 pg (ref 26.0–34.0)
MCHC: 34.1 g/dL (ref 30.0–36.0)
MCV: 95 fL (ref 80.0–100.0)
Monocytes Absolute: 0.6 10*3/uL (ref 0.1–1.0)
Monocytes Relative: 13 %
Neutro Abs: 3.1 10*3/uL (ref 1.7–7.7)
Neutrophils Relative %: 68 %
Platelet Count: 203 10*3/uL (ref 150–400)
RBC: 4.17 MIL/uL — ABNORMAL LOW (ref 4.22–5.81)
RDW: 13.7 % (ref 11.5–15.5)
WBC Count: 4.6 10*3/uL (ref 4.0–10.5)
nRBC: 0 % (ref 0.0–0.2)

## 2023-06-18 LAB — LACTATE DEHYDROGENASE: LDH: 163 U/L (ref 98–192)

## 2023-06-18 LAB — FERRITIN: Ferritin: 36 ng/mL (ref 24–336)

## 2023-06-18 MED ORDER — METHYLPREDNISOLONE 4 MG PO TBPK: ORAL_TABLET | ORAL | 0 refills | Status: DC

## 2023-06-18 NOTE — Progress Notes (Signed)
BP 149/80, instructed to monitor at home and call PCP if does not come down. Pt is in significant back pain.

## 2023-06-18 NOTE — Progress Notes (Signed)
Hematology and Oncology Follow Up Visit  Edwin Webster [id number] 1961-10-03 62 y.o. 06/18/2023   Principle Diagnosis:  Waldenstrom's macroglobulinemia Iron deficiency anemia  Current Therapy:   Rituxan/bendamustine-s/p cycle 2 -- d/c on 06/11/2018 Imbruvica  280 mg po q day -- changed on 08/06/2019 IV iron as indicated - last received on 08/16/2022      Interim History:  Edwin Webster is here today for follow-up.  His problem right now is that he has severe back pain.  This happened when he was working on his pool.  He stood up and then felt this pain in lower back.  He went to the ER.  This was in early July.  He has scans that were done.  He had CT scans of the thoracic and lumbar spine.  There is no evidence of any kind of cancer.  He did have degenerative changes.  He does see neurosurgery next Monday.  He is doing well on the [place].  There is been no problems with the Waldenstrom's.  His last monoclonal spike was 0.3 g/dL.  The IgM level was 430 mg/dL.  The Kappa light chain is 2.3 mg/dL.  He has had no change in bowel or bladder habits.  He has had no cough or shortness of breath.  His wife, currently, has bad sinus infection.  He has had no pain in the legs.  He has had no weakness in the legs.  He has had no rashes.  He did develop some poison oak.  He had a steroid injection for this.  His last iron studies showed a ferritin of 42 with an iron saturation of 35%.  Overall, I would say that his performance status is probably ECOG 1.    Medications:  Allergies as of 06/18/2023       Reactions   Adalimumab Other (See Comments)   "blacked out", confused        Medication List        Accurate as of June 18, 2023 10:35 AM. If you have any questions, ask your nurse or doctor.          aspirin EC 81 MG tablet Take 81 mg daily by mouth.   baclofen 10 MG tablet Commonly known as: LIORESAL TAKE 1 /2 TO 1 TABLET 2 X /DAY IF NEEDED FOR MUSCLE SPASM    betamethasone dipropionate 0.05 % ointment Commonly known as: DIPROLENE Apply topically 2 (two) times daily.   bisoprolol 10 MG tablet Commonly known as: ZEBETA Take   1 tablet  every Morning  for BP   busPIRone 7.5 MG tablet Commonly known as: BUSPAR TAKE 1 TABLET 3 X /DAY FOR CHRONIC ANXIETY   cyanocobalamin 500 MCG tablet Commonly known as: VITAMIN B12 Take 500 mcg by mouth daily.   escitalopram 20 MG tablet Commonly known as: LEXAPRO Take 20 mg by mouth daily.   ezetimibe 10 MG tablet Commonly known as: ZETIA Take 1 tablet (10 mg total) by mouth daily. Please keep scheduled appointment   famciclovir 500 MG tablet Commonly known as: FAMVIR TAKE 1 TABLET BY MOUTH EVERY DAY   folic acid 1 MG tablet Commonly known as: FOLVITE TAKE 1 TABLET DAILY FOR FOLATE DEFICIENCY   Imbruvica 280 MG tablet Generic drug: ibrutinib TAKE 1 TABLET (280 MG) BY MOUTH DAILY.   ipratropium 0.06 % nasal spray Commonly known as: ATROVENT Use 1 to 2 sprays each nostril 2 to 3 x /day as needed   levocetirizine 5 MG tablet Commonly known  asElita Boone TAKE 1 TABLET BY MOUTH DAILY FOR ALLERGIES   olmesartan 40 MG tablet Commonly known as: BENICAR Take 1 tablet (40 mg total) by mouth at bedtime. For BP   oxyCODONE-acetaminophen 5-325 MG tablet Commonly known as: PERCOCET/ROXICET Take 1 tablet by mouth every 6 (six) hours as needed for severe pain.   rosuvastatin 40 MG tablet Commonly known as: CRESTOR TAKE 1 TABLET BY MOUTH EVERY DAY FOR CHOLESTEROL   triamcinolone cream 0.1 % Commonly known as: KENALOG Apply  to skin  Rash   2 x /day   as needed   Vitamin D-3 125 MCG (5000 UT) Tabs Take 5,000 Units by mouth 2 (two) times daily.        Allergies:  Allergies  Allergen Reactions   Adalimumab Other (See Comments)    "blacked out", confused     Past Medical History, Surgical history, Social history, and Family History were reviewed and updated.   His vital signs show a  temperature of 97.9.  Pulse 62.  Blood pressure 152/75.  Weight is 165 pounds.  Review of Systems: Review of Systems  Constitutional: Negative.   HENT: Negative.    Eyes: Negative.   Respiratory: Negative.    Cardiovascular: Negative.   Gastrointestinal:  Negative for blood in stool.  Genitourinary: Negative.   Musculoskeletal:  Positive for back pain.  Skin: Negative.   Neurological: Negative.   Endo/Heme/Allergies: Negative.   Psychiatric/Behavioral: Negative.        Physical Exam:   Wt Readings from Last 3 Encounters:  06/18/23 159 lb 1.9 oz (72.2 kg)  06/03/23 161 lb 12.8 oz (73.4 kg)  05/31/23 160 lb (72.6 kg)    Physical Exam Vitals reviewed.  HENT:     Head: Normocephalic and atraumatic.  Eyes:     Pupils: Pupils are equal, round, and reactive to light.  Cardiovascular:     Rate and Rhythm: Normal rate and regular rhythm.     Heart sounds: Normal heart sounds.  Pulmonary:     Effort: Pulmonary effort is normal.     Breath sounds: Normal breath sounds.  Abdominal:     General: Bowel sounds are normal.     Palpations: Abdomen is soft.  Musculoskeletal:        General: No tenderness or deformity. Normal range of motion.     Cervical back: Normal range of motion.  Lymphadenopathy:     Cervical: No cervical adenopathy.  Skin:    General: Skin is warm and dry.     Findings: No erythema or rash.  Neurological:     Mental Status: He is alert and oriented to person, place, and time.  Psychiatric:        Behavior: Behavior normal.        Thought Content: Thought content normal.        Judgment: Judgment normal.     Lab Results  Component Value Date   WBC 4.6 06/18/2023   HGB 13.5 06/18/2023   HCT 39.6 06/18/2023   MCV 95.0 06/18/2023   PLT 203 06/18/2023   Lab Results  Component Value Date   FERRITIN 42 12/17/2022   IRON 125 12/17/2022   TIBC 353 12/17/2022   UIBC 228 12/17/2022   IRONPCTSAT 35 12/17/2022   Lab Results  Component Value Date    RETICCTPCT 1.2 01/11/2022   RBC 4.17 (L) 06/18/2023   RETICCTABS 33,120 02/12/2018   Lab Results  Component Value Date   KPAFRELGTCHN 23.1 (H) 03/18/2023   LAMBDASER  13.1 03/18/2023   KAPLAMBRATIO 1.76 (H) 03/18/2023   Lab Results  Component Value Date   IGGSERUM 690 03/18/2023   IGA 62 03/18/2023   IGMSERUM 446 (H) 03/18/2023   Lab Results  Component Value Date   TOTALPROTELP 6.3 03/18/2023   ALBUMINELP 3.9 03/18/2023   A1GS 0.2 03/18/2023   A2GS 0.6 03/18/2023   BETS 0.8 03/18/2023   BETA2SER 0.2 02/18/2018   GAMS 0.8 03/18/2023   MSPIKE 0.3 (H) 03/18/2023   SPEI Comment 01/11/2022     Chemistry      Component Value Date/Time   NA 137 06/18/2023 0948   NA 141 08/04/2020 1203   K 3.8 06/18/2023 0948   CL 106 06/18/2023 0948   CO2 23 06/18/2023 0948   BUN 19 06/18/2023 0948   BUN 13 08/04/2020 1203   CREATININE 1.14 06/18/2023 0948   CREATININE 1.19 04/04/2023 1042      Component Value Date/Time   CALCIUM 9.3 06/18/2023 0948   ALKPHOS 81 06/18/2023 0948   AST 14 (L) 06/18/2023 0948   ALT 8 06/18/2023 0948   BILITOT 0.8 06/18/2023 0948      Impression and Plan: Edwin Webster is a very pleasant 62 yo caucasian gentleman with Waldenstrom's macroglobulinemia.  He is on single agent Imbruvica.  He is doing well on this from my point of view.  We will check his monoclonal studies and see how they look.  Hopefully, his back will improve.  I just feel bad that he has the back pain.  I will send in a Medrol Dosepak form that he takes for 6 days.  Hopefully this will help.  We will still plan to get him back in 3 months.  [name], MD 7/23/202410:35 AM

## 2023-06-19 LAB — IRON AND IRON BINDING CAPACITY (CC-WL,HP ONLY)
Iron: 104 ug/dL (ref 45–182)
Saturation Ratios: 30 % (ref 17.9–39.5)
TIBC: 347 ug/dL (ref 250–450)
UIBC: 243 ug/dL (ref 117–376)

## 2023-06-19 LAB — KAPPA/LAMBDA LIGHT CHAINS
Kappa free light chain: 18 mg/L (ref 3.3–19.4)
Kappa, lambda light chain ratio: 1.82 — ABNORMAL HIGH (ref 0.26–1.65)
Lambda free light chains: 9.9 mg/L (ref 5.7–26.3)

## 2023-06-20 LAB — IGG, IGA, IGM
IgA: 43 mg/dL — ABNORMAL LOW (ref 61–437)
IgG (Immunoglobin G), Serum: 587 mg/dL — ABNORMAL LOW (ref 603–1613)
IgM (Immunoglobulin M), Srm: 439 mg/dL — ABNORMAL HIGH (ref 20–172)

## 2023-07-02 ENCOUNTER — Other Ambulatory Visit (HOSPITAL_COMMUNITY): Payer: Self-pay | Admitting: Neurosurgery

## 2023-07-02 DIAGNOSIS — S22000A Wedge compression fracture of unspecified thoracic vertebra, initial encounter for closed fracture: Secondary | ICD-10-CM

## 2023-07-05 ENCOUNTER — Encounter: Payer: Self-pay | Admitting: Family

## 2023-07-25 ENCOUNTER — Ambulatory Visit (INDEPENDENT_AMBULATORY_CARE_PROVIDER_SITE_OTHER): Payer: Medicare HMO | Admitting: Nurse Practitioner

## 2023-07-25 VITALS — BP 158/98 | HR 60 | Temp 97.6°F | Ht 69.0 in | Wt 161.8 lb

## 2023-07-25 DIAGNOSIS — I495 Sick sinus syndrome: Secondary | ICD-10-CM | POA: Diagnosis not present

## 2023-07-25 DIAGNOSIS — I10 Essential (primary) hypertension: Secondary | ICD-10-CM

## 2023-07-25 DIAGNOSIS — I2510 Atherosclerotic heart disease of native coronary artery without angina pectoris: Secondary | ICD-10-CM

## 2023-07-25 DIAGNOSIS — R7309 Other abnormal glucose: Secondary | ICD-10-CM

## 2023-07-25 DIAGNOSIS — E782 Mixed hyperlipidemia: Secondary | ICD-10-CM

## 2023-07-25 DIAGNOSIS — D500 Iron deficiency anemia secondary to blood loss (chronic): Secondary | ICD-10-CM

## 2023-07-25 DIAGNOSIS — F3341 Major depressive disorder, recurrent, in partial remission: Secondary | ICD-10-CM

## 2023-07-25 DIAGNOSIS — Z79899 Other long term (current) drug therapy: Secondary | ICD-10-CM

## 2023-07-25 DIAGNOSIS — S22080A Wedge compression fracture of T11-T12 vertebra, initial encounter for closed fracture: Secondary | ICD-10-CM

## 2023-07-25 DIAGNOSIS — M0600 Rheumatoid arthritis without rheumatoid factor, unspecified site: Secondary | ICD-10-CM

## 2023-07-25 DIAGNOSIS — C880 Waldenstrom macroglobulinemia: Secondary | ICD-10-CM

## 2023-07-25 DIAGNOSIS — E559 Vitamin D deficiency, unspecified: Secondary | ICD-10-CM

## 2023-07-25 DIAGNOSIS — Z6824 Body mass index (BMI) 24.0-24.9, adult: Secondary | ICD-10-CM

## 2023-07-25 MED ORDER — IBUPROFEN 800 MG PO TABS: 800.0000 mg | ORAL_TABLET | Freq: Three times a day (TID) | ORAL | 0 refills | Status: DC | PRN

## 2023-07-25 NOTE — Progress Notes (Signed)
FOLLOW UP Assessment:   Kaisei was seen today for follow-up and medicare wellness.  Diagnoses and all orders for this visit:  CAD Cardiology following  Control blood pressure, cholesterol, glucose, increase exercise.  Denies angina Continue lipid management for miminum LDL <70, ASA  Primary hypertension Continue medication Discussed DASH (Dietary Approaches to Stop Hypertension) DASH diet is lower in sodium than a typical American diet. Cut back on foods that are high in saturated fat, cholesterol, and trans fats. Eat more whole-grain foods, fish, poultry, and nuts Remain active and exercise as tolerated daily.  Monitor BP at home-Call if greater than 130/80.  Check CMP/CBC  Sick sinus syndrome S/p pacemaker, cardiology follows  Seronegative rheumatoid arthritis (HCC) Off of methotrexate; using NSAID prn; discussed voltaren  Waldenstrom's macroglobulinemia (HCC) Oncology following; recently well controlled  Major depressive disorder, recurrent episode, mild with anxious distress (HCC) Continue medications  Lifestyle discussed: diet/exerise, sleep hygiene, stress management, hydration  Hyperlipidemia Continue medication Discussed lifestyle modifications. Recommended diet heavy in fruits and veggies, omega 3's. Decrease consumption of animal meats, cheeses, and dairy products. Remain active and exercise as tolerated. Continue to monitor. Check lipids/TSH   Vitamin D deficiency At goal at recent check; continue to recommend supplementation for goal of 60-100 Defer vitamin D level  Medication management All medications discussed and reviewed in full. All questions and concerns regarding medications addressed.    Abnormal glucose Recent A1Cs at goal Education: Reviewed 'ABCs' of diabetes management  Discussed goals to be met and/or maintained include A1C (<7) Blood pressure (<130/80) Cholesterol (LDL <70) Continue Eye Exam yearly  Continue Dental Exam Q6  mo Discussed dietary recommendations Discussed Physical Activity recommendations Check A1C  BMI 24.0-24.9, adult  Discussed appropriate BMI Diet modification. Physical activity. Encouraged/praised to build confidence.  Iron deficiency anemia due to chronic blood loss Supplement iron as needed; hematology is following  Compression fracture, T12 (HCC)  Was not recommended kyphoplasty Managing with tylenol and gabapentin for now   Recent blood work obtained reviewed and stable - defer this time.    Notify office for further evaluation and treatment, questions or concerns if any reported s/s fail to improve.   The patient was advised to call back or seek an in-person evaluation if any symptoms worsen or if the condition fails to improve as anticipated.   Further disposition pending results of labs. Discussed med's effects and SE's.    I discussed the assessment and treatment plan with the patient. The patient was provided an opportunity to ask questions and all were answered. The patient agreed with the plan and demonstrated an understanding of the instructions.  Discussed med's effects and SE's. Screening labs and tests as requested with regular follow-up as recommended.  I provided 30 minutes of face-to-face time during this encounter including counseling, chart review, and critical decision making was preformed.   Future Appointments  Date Time Provider Department Center  08/08/2023  7:10 AM [facility] DEVICE REMOTES [facility]  09/09/2023  1:00 PM [facility] 1 [facility]  09/23/2023  8:00 AM [facility] LAB [facility] None  09/23/2023  8:15 AM Ennever, Rose Phi, MD [facility] None  10/04/2023  4:00 PM Regan Lemming, MD [facility]  10/28/2023 10:00 AM Adela Glimpse, NP [facility] None  11/07/2023  7:10 AM [facility] DEVICE REMOTES [facility]  02/06/2024  7:10 AM [facility] DEVICE REMOTES [facility]  05/07/2024  7:10 AM  [facility] DEVICE REMOTES [facility]  08/06/2024  7:10 AM [facility] DEVICE REMOTES [facility]  11/05/2024  7:10 AM [facility] DEVICE REMOTES [facility]    Subjective:  MARGIE WOLD is a 62 y.o. male who presents for a 3 month follow up for HTN, hyperlipidemia, glucose management, and vitamin D Def.   Overall he reports feeling well.  He has basal cell to his nose, had removal scheduled at skin surgery center in Jan 2021 and continues with annual follow up.   Pateint has as sero-negative RA by U/S with negative RA factor & Anti-CCP and is followed by Dr Lendon Colonel. Off of methotrexate, takes NSAID PRN, mostly inhands, and doing well.   He was diagnosed with Waldenstrom's macroglobulinemia 2019 after persistent unexplained fatigue; he now follows with Dr. Myna Hidalgo, Rituxan/bendamustine-s/p cycle 2, Imbruvica 420 mg po q day and reports significant improvement with this, ECOG of 0, most notably 12/17/22 by Dr. Myna Hidalgo.   He is retired. He has iron deficiency anemia that is also monitored, IV iron as indicated. He has no problems with bleeding, no changes to bowel or bladder habits. Denies fever, SOB.  Does have occasional fatigue.  Son has CLL.    Patient is on Alendronate for a thoracic Compression fx (T12, L1) due to Osteoporosis. He did see ortho Dr. Ophelia Charter but was released to PRN follow up, was advised not candidate for kyphoplasty and was released. Has pain but managing with tylenol and gabapentin 300 mg TID. IBU 800 mg is also helpful PRN.  Has depression/anxiety and is currently on lexapro 20 mg daily, buspar 5 mg TID PRN, takes BID per patient, been able to taper off of xanax.   BMI is Body mass index is 23.89 kg/m., he has been working on diet and exercise. Continues to remain active on property.    Wt Readings from Last 3 Encounters:  07/25/23 161 lb 12.8 oz (73.4 kg)  06/18/23 159 lb 1.9 oz (72.2 kg)  06/03/23 161 lb 12.8 oz (73.4 kg)   On  Sept 2021 Coronary CTA found NonIschemic CAD and also in Sept 2021 had a PPM inserted for symptomatic Bradycardia/sick sinus syndrome( Dr Elberta Fortis - Cardiologist )  His blood pressure has been controlled at home, today their BP is BP: (!) 158/100 He does workout. He denies chest pain, shortness of breath, dizziness.   He is on cholesterol medication (rosuvastatin 40 mg daily and zetia 10 mg daily) and denies myalgias. His cholesterol is at goal. The cholesterol last visit was:   Lab Results  Component Value Date   CHOL 194 04/04/2023   HDL 61 04/04/2023   LDLCALC 115 (H) 04/04/2023   TRIG 85 04/04/2023   CHOLHDL 3.2 04/04/2023   He has been working on diet and exercise for glucose management, and denies nausea, paresthesia of the feet, polydipsia, polyuria, visual disturbances and vomiting. Last A1C in the office was:  Lab Results  Component Value Date   HGBA1C 5.5 04/04/2023   Last GFR Lab Results  Component Value Date   GFRNONAA >60 06/18/2023   Patient is on Vitamin D supplement, taking 78295 IU dailydaily only Lab Results  Component Value Date   VD25OH 54 04/04/2023        Latest Ref Rng & Units 06/18/2023    9:48 AM 04/04/2023   10:42 AM 03/18/2023   10:03 AM  CBC  WBC 4.0 - 10.5 K/uL 4.6  4.8  4.3   Hemoglobin 13.0 - 17.0 g/dL 62.1  30.8  65.7   Hematocrit 39.0 - 52.0 % 39.6  43.8  41.0   Platelets 150 -  400 K/uL 203  242  208    Lab Results  Component Value Date   IRON 104 06/18/2023   TIBC 347 06/18/2023   FERRITIN 36 06/18/2023     Medication Review:  Current Outpatient Medications (Endocrine & Metabolic):    methylPREDNISolone (MEDROL DOSEPAK) 4 MG TBPK tablet, Take as directed:  6-5-4-3-2-1  Current Outpatient Medications (Cardiovascular):    bisoprolol (ZEBETA) 10 MG tablet, Take   1 tablet  every Morning  for BP   ezetimibe (ZETIA) 10 MG tablet, Take 1 tablet (10 mg total) by mouth daily. Please keep scheduled appointment   olmesartan (BENICAR) 40 MG  tablet, Take 1 tablet (40 mg total) by mouth at bedtime. For BP   rosuvastatin (CRESTOR) 40 MG tablet, TAKE 1 TABLET BY MOUTH EVERY DAY FOR CHOLESTEROL  Current Outpatient Medications (Respiratory):    ipratropium (ATROVENT) 0.06 % nasal spray, Use 1 to 2 sprays each nostril 2 to 3 x /day as needed   levocetirizine (XYZAL) 5 MG tablet, TAKE 1 TABLET BY MOUTH DAILY FOR ALLERGIES  Current Outpatient Medications (Analgesics):    aspirin EC 81 MG tablet, Take 81 mg daily by mouth.   ibuprofen (ADVIL) 800 MG tablet, Take 1 tablet (800 mg total) by mouth every 8 (eight) hours as needed.   oxyCODONE-acetaminophen (PERCOCET/ROXICET) 5-325 MG tablet, Take 1 tablet by mouth every 6 (six) hours as needed for severe pain.  Current Outpatient Medications (Hematological):    folic acid (FOLVITE) 1 MG tablet, TAKE 1 TABLET DAILY FOR FOLATE DEFICIENCY   vitamin B-12 (CYANOCOBALAMIN) 500 MCG tablet, Take 500 mcg by mouth daily.  Current Outpatient Medications (Other):    baclofen (LIORESAL) 10 MG tablet, TAKE 1 /2 TO 1 TABLET 2 X /DAY IF NEEDED FOR MUSCLE SPASM   betamethasone dipropionate (DIPROLENE) 0.05 % ointment, Apply topically 2 (two) times daily. (Patient not taking: Reported on 06/18/2023)   busPIRone (BUSPAR) 7.5 MG tablet, TAKE 1 TABLET 3 X /DAY FOR CHRONIC ANXIETY   Cholecalciferol (VITAMIN D-3) 5000 UNITS TABS, Take 5,000 Units by mouth 2 (two) times daily.    escitalopram (LEXAPRO) 20 MG tablet, Take 20 mg by mouth daily.   famciclovir (FAMVIR) 500 MG tablet, TAKE 1 TABLET BY MOUTH EVERY DAY   ibrutinib (IMBRUVICA) 280 MG tablet, TAKE 1 TABLET (280 MG) BY MOUTH DAILY.   triamcinolone cream (KENALOG) 0.1 %, Apply  to skin  Rash   2 x /day   as needed  Allergies: Allergies  Allergen Reactions   Adalimumab Other (See Comments)    "blacked out", confused     Current Problems (verified) has Hyperlipidemia; Hypertension; Vitamin D deficiency; Medication management; Seronegative rheumatoid  arthritis (HCC); Abnormal glucose; BMI 24.0-24.9, adult; Gonalgia; Ventral hernia; Iron deficiency anemia; Waldenstrom's macroglobulinemia (HCC); Major depressive disorder, recurrent episode, in partial remission (HCC); Sinus bradycardia; History of basal cell cancer; Compression fracture of T12 vertebra (HCC); Osteoporosis; History of colon polyps; Coronary artery disease involving native coronary artery of native heart without angina pectoris; Sick sinus syndrome (HCC); and Left renal stone on their problem list.  Screening Tests Immunization History  Administered Date(s) Administered   DTaP 06/04/2011   Hepatitis B 11/26/2010   Hepatitis B, PED/ADOLESCENT 08/06/2011   Influenza Inj Mdck Quad With Preservative 09/13/2017, 08/21/2018, 09/01/2020   Influenza Split 09/02/2014   Influenza Whole 09/17/2013   Influenza, High Dose Seasonal PF 09/26/2022   Influenza, Seasonal, Injecte, Preservative Fre 10/11/2015   Influenza-Unspecified 09/13/2017   PFIZER(Purple Top)SARS-COV-2 Vaccination 01/24/2020, 03/06/2020, 08/03/2020  PNEUMOCOCCAL CONJUGATE-20 12/06/2021   PPD Test 10/11/2015, 08/05/2019, 09/01/2020   Pneumococcal Polysaccharide-23 12/06/2020   Patient Care Team: Lucky Cowboy, MD as PCP - General (Internal Medicine) Regan Lemming, MD as PCP - Electrophysiology (Cardiology)  Surgical: He  has a past surgical history that includes Rotator cuff repair (1996); Knee surgery; Finger surgery; iron infusion; PACEMAKER IMPLANT (N/A, 08/10/2020); Wisdom tooth extraction; and Colonoscopy (2016). Family His family history includes Breast cancer in his mother; COPD in his maternal grandfather and mother; Colon cancer (age of onset: 18) in his maternal grandfather; Colon polyps in his brother and mother; Colon polyps (age of onset: 40) in his daughter; Colon polyps (age of onset: 29) in his maternal grandfather; Heart failure in his mother; Hyperlipidemia in his brother and mother;  Hypertension in his father; Rectal cancer in his mother; Stroke in his maternal grandfather. Social history  He reports that he quit smoking about 13 years ago. His smoking use included cigarettes. He has never used smokeless tobacco. He reports that he does not drink alcohol and does not use drugs.  Objective:   Today's Vitals   07/25/23 1024  BP: (!) 158/100  Pulse: 60  Temp: 97.6 F (36.4 C)  SpO2: 99%  Weight: 161 lb 12.8 oz (73.4 kg)  Height: 5\' 9"  (1.753 m)     Body mass index is 23.89 kg/m.  General appearance: alert, no distress, WD/WN, male HEENT: normocephalic, sclerae anicteric, TMs pearly, nares patent, no discharge or erythema, pharynx normal Oral cavity: MMM, no lesions Neck: supple, no lymphadenopathy, no thyromegaly, no masses Heart: RRR, normal S1, S2, no murmurs Lungs: CTA bilaterally, no wheezes, rhonchi, or rales Abdomen: +bs, soft, non tender, non distended, no masses, no hepatomegaly, no splenomegaly Musculoskeletal: nontender, no swelling, no obvious deformity Extremities: no edema, no cyanosis, no clubbing Pulses: 2+ symmetric, upper and lower extremities, normal cap refill Neurological: alert, oriented x 3, CN2-12 intact, strength normal upper extremities and lower extremities, sensation normal throughout, DTRs 2+ throughout, no cerebellar signs, gait normal Psychiatric: normal affect, behavior normal, pleasant   Medicare Attestation I have personally reviewed: The patient's medical and social history Their use of alcohol, tobacco or illicit drugs Their current medications and supplements The patient's functional ability including ADLs,fall risks, home safety risks, cognitive, and hearing and visual impairment Diet and physical activities Evidence for depression or mood disorders  The patient's weight, height, BMI, and visual acuity have been recorded in the chart.  I have made referrals, counseling, and provided education to the patient based on  review of the above and I have provided the patient with a written personalized care plan for preventive services.     Adela Glimpse, NP   07/31/2023

## 2023-07-31 NOTE — Patient Instructions (Signed)

## 2023-08-05 ENCOUNTER — Encounter: Payer: Self-pay | Admitting: Family

## 2023-08-08 ENCOUNTER — Ambulatory Visit (INDEPENDENT_AMBULATORY_CARE_PROVIDER_SITE_OTHER): Payer: Medicare HMO

## 2023-08-08 DIAGNOSIS — I495 Sick sinus syndrome: Secondary | ICD-10-CM | POA: Diagnosis not present

## 2023-08-08 LAB — CUP PACEART REMOTE DEVICE CHECK
Battery Remaining Longevity: 95 mo
Battery Remaining Percentage: 76 %
Battery Voltage: 2.99 V
Brady Statistic AP VP Percent: 1 %
Brady Statistic AP VS Percent: 73 %
Brady Statistic AS VP Percent: 1 %
Brady Statistic AS VS Percent: 27 %
Brady Statistic RA Percent Paced: 73 %
Brady Statistic RV Percent Paced: 1 %
Date Time Interrogation Session: 20240912020014
Implantable Lead Connection Status: 753985
Implantable Lead Connection Status: 753985
Implantable Lead Implant Date: 20210915
Implantable Lead Implant Date: 20210915
Implantable Lead Location: 753859
Implantable Lead Location: 753860
Implantable Pulse Generator Implant Date: 20210915
Lead Channel Impedance Value: 410 Ohm
Lead Channel Impedance Value: 550 Ohm
Lead Channel Pacing Threshold Amplitude: 0.625 V
Lead Channel Pacing Threshold Amplitude: 0.75 V
Lead Channel Pacing Threshold Pulse Width: 0.5 ms
Lead Channel Pacing Threshold Pulse Width: 0.5 ms
Lead Channel Sensing Intrinsic Amplitude: 2.6 mV
Lead Channel Sensing Intrinsic Amplitude: 8.5 mV
Lead Channel Setting Pacing Amplitude: 1 V
Lead Channel Setting Pacing Amplitude: 1.625
Lead Channel Setting Pacing Pulse Width: 0.5 ms
Lead Channel Setting Sensing Sensitivity: 2 mV
Pulse Gen Model: 2272
Pulse Gen Serial Number: 3864545

## 2023-08-19 ENCOUNTER — Other Ambulatory Visit: Payer: Self-pay | Admitting: Nurse Practitioner

## 2023-08-19 ENCOUNTER — Encounter: Payer: Self-pay | Admitting: Hematology & Oncology

## 2023-08-19 ENCOUNTER — Encounter: Payer: Self-pay | Admitting: Nurse Practitioner

## 2023-08-19 DIAGNOSIS — M810 Age-related osteoporosis without current pathological fracture: Secondary | ICD-10-CM

## 2023-08-19 DIAGNOSIS — B029 Zoster without complications: Secondary | ICD-10-CM

## 2023-08-19 MED ORDER — ALENDRONATE SODIUM 70 MG PO TABS
70.0000 mg | ORAL_TABLET | ORAL | 3 refills | Status: AC
Start: 2023-08-19 — End: 2024-08-18

## 2023-08-19 MED ORDER — FAMCICLOVIR 500 MG PO TABS
500.0000 mg | ORAL_TABLET | Freq: Every day | ORAL | 1 refills | Status: DC
Start: 2023-08-19 — End: 2023-09-23

## 2023-08-19 NOTE — Progress Notes (Signed)
Remote pacemaker transmission.   

## 2023-09-09 ENCOUNTER — Ambulatory Visit (HOSPITAL_COMMUNITY)
Admission: RE | Admit: 2023-09-09 | Discharge: 2023-09-09 | Disposition: A | Discharge status: Home / Self Care | Payer: Medicare HMO | Source: Ambulatory Visit | Attending: Internal Medicine | Admitting: Internal Medicine

## 2023-09-09 DIAGNOSIS — S22000A Wedge compression fracture of unspecified thoracic vertebra, initial encounter for closed fracture: Secondary | ICD-10-CM | POA: Insufficient documentation

## 2023-09-23 ENCOUNTER — Inpatient Hospital Stay: Payer: Medicare HMO | Attending: Hematology & Oncology

## 2023-09-23 ENCOUNTER — Other Ambulatory Visit: Payer: Self-pay

## 2023-09-23 ENCOUNTER — Inpatient Hospital Stay (HOSPITAL_BASED_OUTPATIENT_CLINIC_OR_DEPARTMENT_OTHER): Payer: Medicare HMO | Admitting: Hematology & Oncology

## 2023-09-23 VITALS — BP 180/90 | HR 60 | Temp 97.8°F | Resp 16 | Ht 69.0 in | Wt 161.0 lb

## 2023-09-23 DIAGNOSIS — B029 Zoster without complications: Secondary | ICD-10-CM

## 2023-09-23 DIAGNOSIS — D500 Iron deficiency anemia secondary to blood loss (chronic): Secondary | ICD-10-CM

## 2023-09-23 DIAGNOSIS — C8800 Waldenstrom macroglobulinemia not having achieved remission: Secondary | ICD-10-CM

## 2023-09-23 DIAGNOSIS — D509 Iron deficiency anemia, unspecified: Secondary | ICD-10-CM | POA: Insufficient documentation

## 2023-09-23 LAB — CMP (CANCER CENTER ONLY)
ALT: 10 U/L (ref 0–44)
AST: 19 U/L (ref 15–41)
Albumin: 4.8 g/dL (ref 3.5–5.0)
Alkaline Phosphatase: 79 U/L (ref 38–126)
Anion gap: 9 (ref 5–15)
BUN: 11 mg/dL (ref 8–23)
CO2: 28 mmol/L (ref 22–32)
Calcium: 9.2 mg/dL (ref 8.9–10.3)
Chloride: 103 mmol/L (ref 98–111)
Creatinine: 1.16 mg/dL (ref 0.61–1.24)
GFR, Estimated: >60 mL/min (ref >60)
Glucose, Bld: 100 mg/dL — ABNORMAL HIGH (ref 70–99)
Potassium: 3.6 mmol/L (ref 3.5–5.1)
Sodium: 140 mmol/L (ref 135–145)
Total Bilirubin: 0.8 mg/dL (ref 0.3–1.2)
Total Protein: 6.7 g/dL (ref 6.5–8.1)

## 2023-09-23 LAB — IRON AND IRON BINDING CAPACITY (CC-WL,HP ONLY)
Iron: 141 ug/dL (ref 45–182)
Saturation Ratios: 34 % (ref 17.9–39.5)
TIBC: 416 ug/dL (ref 250–450)
UIBC: 275 ug/dL (ref 117–376)

## 2023-09-23 LAB — CBC WITH DIFFERENTIAL (CANCER CENTER ONLY)
Abs Immature Granulocytes: 0.01 10*3/uL (ref 0.00–0.07)
Basophils Absolute: 0.1 10*3/uL (ref 0.0–0.1)
Basophils Relative: 2 %
Eosinophils Absolute: 0.1 10*3/uL (ref 0.0–0.5)
Eosinophils Relative: 2 %
HCT: 43.8 % (ref 39.0–52.0)
Hemoglobin: 14.9 g/dL (ref 13.0–17.0)
Immature Granulocytes: 0 %
Lymphocytes Relative: 19 %
Lymphs Abs: 0.9 10*3/uL (ref 0.7–4.0)
MCH: 32.7 pg (ref 26.0–34.0)
MCHC: 34 g/dL (ref 30.0–36.0)
MCV: 96.3 fL (ref 80.0–100.0)
Monocytes Absolute: 0.5 10*3/uL (ref 0.1–1.0)
Monocytes Relative: 11 %
Neutro Abs: 3.2 10*3/uL (ref 1.7–7.7)
Neutrophils Relative %: 66 %
Platelet Count: 247 10*3/uL (ref 150–400)
RBC: 4.55 MIL/uL (ref 4.22–5.81)
RDW: 13.2 % (ref 11.5–15.5)
WBC Count: 4.8 10*3/uL (ref 4.0–10.5)
nRBC: 0 % (ref 0.0–0.2)

## 2023-09-23 LAB — FERRITIN: Ferritin: 23 ng/mL — ABNORMAL LOW (ref 24–336)

## 2023-09-23 LAB — LACTATE DEHYDROGENASE: LDH: 180 U/L (ref 98–192)

## 2023-09-23 MED ORDER — ACYCLOVIR 200 MG PO CAPS: 200.0000 mg | ORAL_CAPSULE | Freq: Two times a day (BID) | ORAL | 4 refills | Status: DC

## 2023-09-23 NOTE — Progress Notes (Signed)
Hematology and Oncology Follow Up Visit  Edwin Webster [id number] 1961-02-12 62 y.o. 09/23/2023   Principle Diagnosis:  Waldenstrom's macroglobulinemia Iron deficiency anemia  Current Therapy:   Rituxan/bendamustine-s/p cycle 2 -- d/c on 06/11/2018 Imbruvica  280 mg po q day -- changed on 08/06/2019 IV iron as indicated - last received on 08/16/2022      Interim History:  Mr. Powles is here today for follow-up.  He is still having problems with his back.  He did have MRI done on 09/09/2023.  This did show an acute compression fracture at T8.  He has chronic fractures that I think T11 and L1.  He sees Neurosurgery tomorrow.  Sounds like he may be a candidate for kyphoplasty.  Otherwise, he seems to be doing pretty well.  He really has had no other complaints.  We can have to get him off the Famvir as [facility] does not cure this any longer.  I will see about getting him on acyclovir.  As far as the Waldenstrom's is concerned, his last monoclonal spike was 0.4 g/dL.  The IgM level was 436 mg/dL.  The kappa light chain was 1.8 mg/dL.  We will check his iron levels.  Ammonia some, his ferritin was 36 with an iron saturation of 30%.  He has had no problems with bowels or bladder.  He has had no bleeding.  He has had no leg swelling.Marland Kitchen  He does have a pacemaker in.  This is working well.  Thankfully, he has had no problems with COVID.  Overall, I would say that his performance status is probably ECOG 1.   Medications:  Allergies as of 09/23/2023       Reactions   Adalimumab Other (See Comments)   "blacked out", confused        Medication List        Accurate as of September 23, 2023  8:31 AM. If you have any questions, ask your nurse or doctor.          alendronate 70 MG tablet Commonly known as: FOSAMAX Take 1 tablet (70 mg total) by mouth every 7 (seven) days. Take with a full glass of water on an empty stomach and avoid reclining or lying down for 2 hours after taking this  medication.   aspirin EC 81 MG tablet Take 81 mg daily by mouth.   baclofen 10 MG tablet Commonly known as: LIORESAL TAKE 1 /2 TO 1 TABLET 2 X /DAY IF NEEDED FOR MUSCLE SPASM   betamethasone dipropionate 0.05 % ointment Commonly known as: DIPROLENE Apply topically 2 (two) times daily.   bisoprolol 10 MG tablet Commonly known as: ZEBETA Take   1 tablet  every Morning  for BP   busPIRone 7.5 MG tablet Commonly known as: BUSPAR TAKE 1 TABLET 3 X /DAY FOR CHRONIC ANXIETY   cyanocobalamin 500 MCG tablet Commonly known as: VITAMIN B12 Take 500 mcg by mouth daily.   escitalopram 20 MG tablet Commonly known as: LEXAPRO Take 20 mg by mouth daily.   ezetimibe 10 MG tablet Commonly known as: ZETIA Take 1 tablet (10 mg total) by mouth daily. Please keep scheduled appointment   famciclovir 500 MG tablet Commonly known as: FAMVIR Take 1 tablet (500 mg total) by mouth daily.   folic acid 1 MG tablet Commonly known as: FOLVITE TAKE 1 TABLET DAILY FOR FOLATE DEFICIENCY   ibuprofen 800 MG tablet Commonly known as: ADVIL Take 1 tablet (800 mg total) by mouth every 8 (eight) hours  as needed.   Imbruvica 280 MG tablet Generic drug: ibrutinib TAKE 1 TABLET (280 MG) BY MOUTH DAILY.   ipratropium 0.06 % nasal spray Commonly known as: ATROVENT Use 1 to 2 sprays each nostril 2 to 3 x /day as needed   levocetirizine 5 MG tablet Commonly known as: XYZAL TAKE 1 TABLET BY MOUTH DAILY FOR ALLERGIES   methylPREDNISolone 4 MG Tbpk tablet Commonly known as: MEDROL DOSEPAK Take as directed:  6-5-4-3-2-1   olmesartan 40 MG tablet Commonly known as: BENICAR Take 1 tablet (40 mg total) by mouth at bedtime. For BP   oxyCODONE-acetaminophen 5-325 MG tablet Commonly known as: PERCOCET/ROXICET Take 1 tablet by mouth every 6 (six) hours as needed for severe pain.   rosuvastatin 40 MG tablet Commonly known as: CRESTOR TAKE 1 TABLET BY MOUTH EVERY DAY FOR CHOLESTEROL   triamcinolone cream  0.1 % Commonly known as: KENALOG Apply  to skin  Rash   2 x /day   as needed   Vitamin D-3 125 MCG (5000 UT) Tabs Take 5,000 Units by mouth 2 (two) times daily.        Allergies:  Allergies  Allergen Reactions   Adalimumab Other (See Comments)    "blacked out", confused     Past Medical History, Surgical history, Social history, and Family History were reviewed and updated.   His vital signs show a temperature of 97.9.  Pulse 62.  Blood pressure 152/75.  Weight is 165 pounds.  Review of Systems: Review of Systems  Constitutional: Negative.   HENT: Negative.    Eyes: Negative.   Respiratory: Negative.    Cardiovascular: Negative.   Gastrointestinal:  Negative for blood in stool.  Genitourinary: Negative.   Musculoskeletal:  Positive for back pain.  Skin: Negative.   Neurological: Negative.   Endo/Heme/Allergies: Negative.   Psychiatric/Behavioral: Negative.        Physical Exam:   Wt Readings from Last 3 Encounters:  09/23/23 161 lb (73 kg)  07/25/23 161 lb 12.8 oz (73.4 kg)  06/18/23 159 lb 1.9 oz (72.2 kg)    Physical Exam Vitals reviewed.  HENT:     Head: Normocephalic and atraumatic.  Eyes:     Pupils: Pupils are equal, round, and reactive to light.  Cardiovascular:     Rate and Rhythm: Normal rate and regular rhythm.     Heart sounds: Normal heart sounds.  Pulmonary:     Effort: Pulmonary effort is normal.     Breath sounds: Normal breath sounds.  Abdominal:     General: Bowel sounds are normal.     Palpations: Abdomen is soft.  Musculoskeletal:        General: No tenderness or deformity. Normal range of motion.     Cervical back: Normal range of motion.  Lymphadenopathy:     Cervical: No cervical adenopathy.  Skin:    General: Skin is warm and dry.     Findings: No erythema or rash.  Neurological:     Mental Status: He is alert and oriented to person, place, and time.  Psychiatric:        Behavior: Behavior normal.        Thought  Content: Thought content normal.        Judgment: Judgment normal.      Lab Results  Component Value Date   WBC 4.8 09/23/2023   HGB 14.9 09/23/2023   HCT 43.8 09/23/2023   MCV 96.3 09/23/2023   PLT 247 09/23/2023   Lab Results  Component Value Date   FERRITIN 36 06/18/2023   IRON 104 06/18/2023   TIBC 347 06/18/2023   UIBC 243 06/18/2023   IRONPCTSAT 30 06/18/2023   Lab Results  Component Value Date   RETICCTPCT 1.2 01/11/2022   RBC 4.55 09/23/2023   RETICCTABS 33,120 02/12/2018   Lab Results  Component Value Date   KPAFRELGTCHN 18.0 06/18/2023   LAMBDASER 9.9 06/18/2023   KAPLAMBRATIO 1.82 (H) 06/18/2023   Lab Results  Component Value Date   IGGSERUM 587 (L) 06/18/2023   IGGSERUM 582 (L) 06/18/2023   IGA 43 (L) 06/18/2023   IGA 42 (L) 06/18/2023   IGMSERUM 439 (H) 06/18/2023   IGMSERUM 432 (H) 06/18/2023   Lab Results  Component Value Date   TOTALPROTELP 6.5 06/18/2023   ALBUMINELP 4.0 06/18/2023   A1GS 0.2 06/18/2023   A2GS 0.6 06/18/2023   BETS 0.9 06/18/2023   BETA2SER 0.2 02/18/2018   GAMS 0.7 06/18/2023   MSPIKE 0.4 (H) 06/18/2023   SPEI Comment 01/11/2022     Chemistry      Component Value Date/Time   NA 137 06/18/2023 0948   NA 141 08/04/2020 1203   K 3.8 06/18/2023 0948   CL 106 06/18/2023 0948   CO2 23 06/18/2023 0948   BUN 19 06/18/2023 0948   BUN 13 08/04/2020 1203   CREATININE 1.14 06/18/2023 0948   CREATININE 1.19 04/04/2023 1042      Component Value Date/Time   CALCIUM 9.3 06/18/2023 0948   ALKPHOS 81 06/18/2023 0948   AST 14 (L) 06/18/2023 0948   ALT 8 06/18/2023 0948   BILITOT 0.8 06/18/2023 0948      Impression and Plan: Mr. Larick is a very pleasant 62 yo caucasian gentleman with Waldenstrom's macroglobulinemia.  He is on single agent Imbruvica.  He is doing well on this from my point of view.  We will check his monoclonal studies and see how they look.  The newer problem that has this is back.  Again, if he needs any  kind of intervention for the back, I do not have a problem with this.  Merlyn Albert him is that he is on Fosamax.  I think we can probably get him through the rest of the year.  I do not see need for any kind of x-ray studies.  He does not need to have a bone marrow test.  Will continue to follow his monoclonal studies.   [name], MD 10/28/20248:31 AM

## 2023-09-24 LAB — KAPPA/LAMBDA LIGHT CHAINS
Kappa free light chain: 20.7 mg/L — ABNORMAL HIGH (ref 3.3–19.4)
Kappa, lambda light chain ratio: 1.82 — ABNORMAL HIGH (ref 0.26–1.65)
Lambda free light chains: 11.4 mg/L (ref 5.7–26.3)

## 2023-09-24 LAB — IGG, IGA, IGM
IgA: 52 mg/dL — ABNORMAL LOW (ref 61–437)
IgG (Immunoglobin G), Serum: 578 mg/dL — ABNORMAL LOW (ref 603–1613)
IgM (Immunoglobulin M), Srm: 500 mg/dL — ABNORMAL HIGH (ref 20–172)

## 2023-09-25 ENCOUNTER — Encounter: Payer: Self-pay | Admitting: Family

## 2023-10-02 ENCOUNTER — Encounter: Payer: Medicare HMO | Admitting: Internal Medicine

## 2023-10-02 LAB — IMMUNOFIXATION REFLEX, SERUM
IgA: 59 mg/dL — ABNORMAL LOW (ref 61–437)
IgG (Immunoglobin G), Serum: 639 mg/dL (ref 603–1613)
IgM (Immunoglobulin M), Srm: 547 mg/dL — ABNORMAL HIGH (ref 20–172)

## 2023-10-02 LAB — PROTEIN ELECTROPHORESIS, SERUM, WITH REFLEX
A/G Ratio: 1.3 (ref 0.7–1.7)
Albumin ELP: 3.9 g/dL (ref 2.9–4.4)
Alpha-1-Globulin: 0.3 g/dL (ref 0.0–0.4)
Alpha-2-Globulin: 0.7 g/dL (ref 0.4–1.0)
Beta Globulin: 1 g/dL (ref 0.7–1.3)
Gamma Globulin: 0.9 g/dL (ref 0.4–1.8)
Globulin, Total: 2.9 g/dL (ref 2.2–3.9)
M-Spike, %: 0.4 g/dL — ABNORMAL HIGH
SPEP Interpretation: 0
Total Protein ELP: 6.8 g/dL (ref 6.0–8.5)

## 2023-10-04 ENCOUNTER — Encounter: Payer: Medicare HMO | Admitting: Cardiology

## 2023-10-07 ENCOUNTER — Other Ambulatory Visit: Payer: Self-pay | Admitting: Neurosurgery

## 2023-10-08 ENCOUNTER — Encounter: Payer: Self-pay | Admitting: Cardiology

## 2023-10-08 ENCOUNTER — Encounter (HOSPITAL_COMMUNITY): Payer: Self-pay | Admitting: Neurosurgery

## 2023-10-08 ENCOUNTER — Other Ambulatory Visit: Payer: Self-pay

## 2023-10-08 NOTE — Progress Notes (Signed)
PERIOPERATIVE PRESCRIPTION FOR IMPLANTED CARDIAC DEVICE PROGRAMMING  Patient Information: Name:  Edwin Webster  DOB:  08-04-1961  MRN:  [id number]  Planned Procedure:  Kyphoplasty Thoracic Eight  Surgeon:  Dr. Lisbeth Renshaw  Date of Procedure:  10/10/2023  Cautery will be used.  Position during surgery:  prone   Device Information:  Clinic EP Physician:  Loman Brooklyn, MD   Device Type:  Pacemaker Manufacturer and Phone #:  [employer]: [phone] Pacemaker Dependent?:  No. Date of Last Device Check:  08/08/2023 Normal Device Function?:  Yes.    Electrophysiologist's Recommendations:  Have magnet available. Provide continuous ECG monitoring when magnet is used or reprogramming is to be performed.  Procedure will likely interfere with device function.  Device should be programmed:  Asynchronous pacing during procedure and returned to normal programming after procedure -recommendation due to position during surgery.  Per Device Clinic Standing Orders, Wiliam Ke, RN  1:14 PM 10/08/2023

## 2023-10-08 NOTE — Progress Notes (Signed)
SDW CALL  Patient was given pre-op instructions over the phone. The opportunity was given for the patient to ask questions. No further questions asked. Patient verbalized understanding of instructions given.   PCP - Dr. Lucky Cowboy Cardiologist - Dr. Elberta Fortis Oncologist - Dr. Myna Hidalgo  PPM/ICD - PPM - [employer] Device Orders - sent on 11/12 Rep Notified - Left voicemail with Arlys John on 11/12 at 1304  Chest x-ray - denies EKG - will need DOS Stress Test - 2006 ECHO - 07/2020 Cardiac Cath - denies  Sleep Study - 2018 CPAP - n/a  Fasting Blood Sugar - n/a   Last dose of GLP1 agonist-  n/a GLP1 instructions: n/a  Blood Thinner Instructions: n/a Aspirin Instructions: n/a   COVID TEST- n/a   Anesthesia review: yes - pacemaker, COPD, HTN  Patient denies shortness of breath, fever, cough and chest pain over the phone call   All instructions explained to the patient, with a verbal understanding of the material. Patient agrees to go over the instructions while at home for a better understanding.

## 2023-10-09 NOTE — Progress Notes (Signed)
Anesthesia Chart Review: Edwin Webster   Case: [id number] Date/Time: 10/10/23 1015   Procedure: KYPHOPLASTY T8 (Bilateral)   Anesthesia type: General   Pre-op diagnosis: OSTEOPOROSIS WITH CURRENT PATHOLOGICAL FX   Location: [facility] ROOM 18 / [facility]   Surgeons: Lisbeth Renshaw, MD       DISCUSSION: Patient is a 62 year old male scheduled for the above procedure.  History includes former smoker, HTN, HLD, SSS (s/p [employer] Jude [employer]urity MRI dual-chamber PPM 08/10/20), COPD, Waldenstrm's macroglobulinemia, iron deficiency anemia, GERD, seronegative RA, gout, diverticulitis, skin cancer (BCC, excision from nose 11/2019).  Last visit with [facility] Dr. Myna Hidalgo was on 09/23/23 for follow-up IDA and Waldenstrom's macroglobulinemia. Previously on Rituxan/bendamustine in 2019. He started Reunion ~ 2020. "As far as the Waldenstrom's is concerned, his last monoclonal spike was 0.4 g/dL. The IgM level was 436 mg/dL. The kappa light chain was 1.8 mg/dL." He receives as needed IV iron, last 08/16/22. Overall doing well except for back pain and had pending neurosurgery evaluation for consideration of kyphoplasty.  Last PCP follow-up was on 07/25/23 with Adela Glimpse, NP. Note reviewed. No chest pain. On statin therapy.   Last visit with EP cardiologist Dr Elberta Fortis was on 09/24/22, next visit with APP is scheduled for 10/29/23.  No CV symptoms.  Device was function normally.  Some short runs of SVT, but asymptomatic and brief.  No need for anticoagulation at that time.  No device changes made.  He last saw cardiologist Dr. Allyson Sabal on 10/18/20 following a CCTA for atypical chest pain 08/03/20 CCTA that primarily showed nonobstructive disease except in the distal circumflex.  His CAC was 665.  Dr. Allyson Sabal suspected atypical chest pain was not cardiac. Fatigue and atypical symptoms markedly improved following PM 08/10/20. No additional CV testing ordered at that time. 12 month follow-up was planned, but he has primarily  just been following with EP cardiology, as above.   EP Perioperative PPM Management Recommendations: Device Information: Clinic EP Physician:  Loman Brooklyn, MD  Device Type:  Pacemaker Manufacturer and Phone #:  St. Jude/[employer]: [phone] Pacemaker Dependent?:  No. Date of Last Device Check:  08/08/2023       Normal Device Function?:  Yes.     Electrophysiologist's Recommendations: Have magnet available. Provide continuous ECG monitoring when magnet is used or reprogramming is to be performed.  Procedure will likely interfere with device function.  Device should be programmed:  Asynchronous pacing during procedure and returned to normal programming after procedure -recommendation due to position during surgery.  He has a same-day workup, so anesthesia team to evaluate on the day of surgery.   VS:  BP Readings from Last 3 Encounters:  09/23/23 (!) 180/90  07/25/23 (!) 158/98  06/18/23 (!) 159/81   Pulse Readings from Last 3 Encounters:  09/23/23 60  07/25/23 60  06/18/23 61     PROVIDERS: Lucky Cowboy, MD Loman Brooklyn, MD is EP cardiologist Arlan Organ, MD is Toy Baker, MD is rheumatologist   LABS: Most recent lab results in [facility] include: Lab Results  Component Value Date   WBC 4.8 09/23/2023   HGB 14.9 09/23/2023   HCT 43.8 09/23/2023   PLT 247 09/23/2023   GLUCOSE 100 (H) 09/23/2023   CHOL 194 04/04/2023   TRIG 85 04/04/2023   HDL 61 04/04/2023   LDLCALC 115 (H) 04/04/2023   ALT 10 09/23/2023   AST 19 09/23/2023   NA 140 09/23/2023   K 3.6 09/23/2023   CL 103 09/23/2023  CREATININE 1.16 09/23/2023   BUN 11 09/23/2023   CO2 28 09/23/2023   TSH 1.77 04/04/2023   PSA 0.61 09/26/2022   HGBA1C 5.5 04/04/2023     Sleep Study 11/20/17: IMPRESSION:  1. Periodic Limb Movement Disorder (PLMD).  2. Primary Snoring. 3. Dysfunctions associated with sleep stages or arousal from sleep. (This study does not demonstrate any significant  obstructive or central sleep disordered breathing with the exception of mild to at times moderate snoring.)   IMAGES: MRI T-Spine 09/09/23: IMPRESSION: 1. Acute or subacute wedge-shaped compression deformity of the T8 vertebral body with up to 45% height loss anteriorly. 2. Chronic superior endplate compression deformities of T12 and L1. 3. No foraminal or canal stenosis at any level.   CT T/L Spine 05/31/23: IMPRESSION: 1. No acute osseous findings in the thoracic or lumbar spine. 2. Chronic superior endplate compression deformities at T12 and L1, unchanged from abdominal CT 04/25/2022. 3. Minimal thoracic spondylosis without significant spinal stenosis or foraminal narrowing. 4. Chronic bilateral L5 pars defects with minimal anterolisthesis at L5-S1. 5. Mild lumbar spondylosis without significant spinal stenosis. Mild right foraminal narrowing at L4-5 secondary to asymmetric disc bulging and facet hypertrophy. 6.  Aortic Atherosclerosis (ICD10-I70.0).    EKG: EKG 09/24/22: NSR  CV: CT Coronary 08/03/20: IMPRESSION: 1. 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 These lesions may be subject to calcium blooming artifact, therefore CT FFR will be performed and reported separately. 2. Coronary calcium score is 665, which places the patient in the 92nd percentile for age and sex matched controls. 3. 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 Benign course, no slit like origin. Right dominance. FFR 08/03/20: 1. Left Main: FFR = 0.97   2. LAD: Proximal FFR = 0.93, Mid FFR = 0.90, Distal FFR = 0.86, D1 FFR = 0.82 3. LCX: Proximal FFR = 0.98, Distal FFR = 0.61 4. RCA: Proximal FFR = 0.91, Mid FFR =0.91, Distal FFR = 0.90, PDA not mapped, PL proximal FFR 0.90   FFR IMPRESSION: 1. CT FFR analysis showed significant stenosis in the distal left circumflex. Severe lesions in proximal  LAD and D1 likely overestimated due to calcium blooming artifact.   RECOMMENDATIONS: Guideline directed medical therapy and aggressive risk factor modification for secondary prevention of coronary artery disease. - Per Dr. Allyson Sabal 10/18/20, "History of atypical chest pain with coronary calcium score 665 measured 08/03/2020 with distal circumflex disease. I suspect his chest pain is noncardiac." Fatigue and atypical symptoms markedly improved following PPM insertion 08/10/20.    Echo 07/27/20: IMPRESSIONS   1. Left ventricular ejection fraction, by estimation, is 55 to 60%. The  left ventricle has normal function. The left ventricle has no regional  wall motion abnormalities. Left ventricular diastolic parameters were  normal.   2. Right ventricular systolic function is normal. The right ventricular  size is normal.   3. The mitral valve is normal in structure. No evidence of mitral valve  regurgitation. No evidence of mitral stenosis.   4. The aortic valve is normal in structure. Aortic valve regurgitation is  mild. Mild aortic valve sclerosis is present, with no evidence of aortic  valve stenosis.   5. The inferior vena cava is normal in size with greater than 50%  respiratory variability, suggesting right atrial pressure of 3 mmHg.   US Carotid 09/20/17: Final Interpretation:  - Right Carotid: There is evidence in the right ICA  of a 1-39% stenosis.  - Left Carotid: There is evidence in the left ICA of a 1-39% stenosis.  - Vertebrals:  Both vertebral arteries were patent with antegrade flow.  - Subclavians: Normal flow hemodynamics were seen in bilateral subclavian               arteries.    Past Medical History:  Diagnosis Date   Allergy    seasonal allergies   Anemia    Anxiety    on meds   Arthritis    Chest pain, non-cardiac    COPD (chronic obstructive pulmonary disease) (HCC)    past smoker   Counseling regarding goals of care 03/27/2018   Depression    on meds    Diverticulitis    Diverticulosis    Fractured pelvis (HCC)    fall from ladder   GERD (gastroesophageal reflux disease)    on meds   Gout    History of shingles    Hyperlipidemia    on meds   Hypertension    on meds   Internal hemorrhoids    Iron deficiency anemia 03/13/2018   Kidney stones    Osteoporosis    Other abnormal glucose 10/11/2015   Presence of permanent cardiac pacemaker    Rheumatoid arthritis (HCC)    hands   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    Past Surgical History:  Procedure Laterality Date   COLONOSCOPY  2016   JMP-suprep(exc)-mild TICS/int hems/normal-5 yr recall   FINGER SURGERY Right    ring finger   iron infusion     KNEE SURGERY     right   PACEMAKER IMPLANT N/A 08/10/2020   Procedure: PACEMAKER IMPLANT;  Surgeon: Regan Lemming, MD;  Location: [facility] INVASIVE CV LAB;  Service: Cardiovascular;  Laterality: N/A;   ROTATOR CUFF REPAIR  1996   left   WISDOM TOOTH EXTRACTION      MEDICATIONS: No current facility-administered medications for this encounter.    acetaminophen (TYLENOL) 650 MG CR tablet   alendronate (FOSAMAX) 70 MG tablet   betamethasone dipropionate (DIPROLENE) 0.05 % ointment   Cholecalciferol (VITAMIN D-3) 5000 UNITS TABS   escitalopram (LEXAPRO) 20 MG tablet   ezetimibe (ZETIA) 10 MG tablet   famciclovir (FAMVIR) 500 MG tablet   folic acid (FOLVITE) 1 MG tablet   ibuprofen (ADVIL) 800 MG tablet   ipratropium (ATROVENT) 0.06 % nasal spray   olmesartan (BENICAR) 40 MG tablet   rosuvastatin (CRESTOR) 40 MG tablet   acyclovir (ZOVIRAX) 200 MG capsule   baclofen (LIORESAL) 10 MG tablet   bisoprolol (ZEBETA) 10 MG tablet   busPIRone (BUSPAR) 7.5 MG tablet   levocetirizine (XYZAL) 5 MG tablet   triamcinolone cream (KENALOG) 0.1 %    Shonna Chock, PA-C Surgical Short Stay/Anesthesiology [facility] Phone [phone] [facility] Phone [phone] 10/09/2023 11:09 AM

## 2023-10-09 NOTE — Anesthesia Preprocedure Evaluation (Signed)
Anesthesia Evaluation  Patient identified by MRN, date of birth, ID band Patient awake    Reviewed: Allergy & Precautions, H&P , NPO status , Patient's Chart, lab work & pertinent test results  Airway Mallampati: II   Neck ROM: full    Dental   Pulmonary COPD, former smoker   breath sounds clear to auscultation       Cardiovascular hypertension, + dysrhythmias + pacemaker  Rhythm:regular Rate:Normal     Neuro/Psych  PSYCHIATRIC DISORDERS Anxiety Depression       GI/Hepatic ,GERD  ,,  Endo/Other    Renal/GU stones     Musculoskeletal  (+) Arthritis ,    Abdominal   Peds  Hematology  (+) Blood dyscrasia, anemia Waldenstrom's macroglobinemia   Anesthesia Other Findings   Reproductive/Obstetrics                             Anesthesia Physical Anesthesia Plan  ASA: 3  Anesthesia Plan: General   Post-op Pain Management:    Induction: Intravenous  PONV Risk Score and Plan: 2 and Ondansetron, Dexamethasone, Treatment may vary due to age or medical condition and Midazolam  Airway Management Planned: Oral ETT  Additional Equipment:   Intra-op Plan:   Post-operative Plan: Extubation in OR  Informed Consent: I have reviewed the patients History and Physical, chart, labs and discussed the procedure including the risks, benefits and alternatives for the proposed anesthesia with the patient or authorized representative who has indicated his/her understanding and acceptance.     Dental advisory given  Plan Discussed with: CRNA, Anesthesiologist and Surgeon  Anesthesia Plan Comments: (PAT note written 10/09/2023 by Shonna Chock, PA-C.  )       Anesthesia Quick Evaluation

## 2023-10-10 ENCOUNTER — Ambulatory Visit (HOSPITAL_COMMUNITY)
Admission: RE | Admit: 2023-10-10 | Discharge: 2023-10-10 | Disposition: A | Discharge status: Home / Self Care | Payer: Medicare HMO | Attending: Neurosurgery | Admitting: Neurosurgery

## 2023-10-10 ENCOUNTER — Ambulatory Visit (HOSPITAL_COMMUNITY): Payer: Self-pay | Admitting: Vascular Surgery

## 2023-10-10 ENCOUNTER — Ambulatory Visit (HOSPITAL_COMMUNITY): Payer: Medicare HMO

## 2023-10-10 ENCOUNTER — Other Ambulatory Visit: Payer: Self-pay

## 2023-10-10 ENCOUNTER — Encounter (HOSPITAL_COMMUNITY): Payer: Self-pay | Admitting: Neurosurgery

## 2023-10-10 ENCOUNTER — Encounter (HOSPITAL_COMMUNITY)
Admission: RE | Disposition: A | Discharge status: Home / Self Care | Payer: Self-pay | Source: Home / Self Care | Attending: Neurosurgery

## 2023-10-10 ENCOUNTER — Ambulatory Visit (HOSPITAL_BASED_OUTPATIENT_CLINIC_OR_DEPARTMENT_OTHER): Payer: Self-pay | Admitting: Vascular Surgery

## 2023-10-10 ENCOUNTER — Other Ambulatory Visit (HOSPITAL_COMMUNITY): Payer: Self-pay

## 2023-10-10 DIAGNOSIS — C8800 Waldenstrom macroglobulinemia not having achieved remission: Secondary | ICD-10-CM | POA: Diagnosis not present

## 2023-10-10 DIAGNOSIS — Z950 Presence of cardiac pacemaker: Secondary | ICD-10-CM | POA: Insufficient documentation

## 2023-10-10 DIAGNOSIS — F419 Anxiety disorder, unspecified: Secondary | ICD-10-CM | POA: Insufficient documentation

## 2023-10-10 DIAGNOSIS — J449 Chronic obstructive pulmonary disease, unspecified: Secondary | ICD-10-CM | POA: Insufficient documentation

## 2023-10-10 DIAGNOSIS — I10 Essential (primary) hypertension: Secondary | ICD-10-CM | POA: Insufficient documentation

## 2023-10-10 DIAGNOSIS — Z87891 Personal history of nicotine dependence: Secondary | ICD-10-CM | POA: Insufficient documentation

## 2023-10-10 DIAGNOSIS — I495 Sick sinus syndrome: Secondary | ICD-10-CM | POA: Insufficient documentation

## 2023-10-10 DIAGNOSIS — M0600 Rheumatoid arthritis without rheumatoid factor, unspecified site: Secondary | ICD-10-CM | POA: Insufficient documentation

## 2023-10-10 DIAGNOSIS — F32A Depression, unspecified: Secondary | ICD-10-CM | POA: Diagnosis not present

## 2023-10-10 DIAGNOSIS — M8008XA Age-related osteoporosis with current pathological fracture, vertebra(e), initial encounter for fracture: Secondary | ICD-10-CM | POA: Diagnosis present

## 2023-10-10 DIAGNOSIS — E785 Hyperlipidemia, unspecified: Secondary | ICD-10-CM | POA: Insufficient documentation

## 2023-10-10 HISTORY — DX: Presence of cardiac pacemaker: Z95.0

## 2023-10-10 HISTORY — PX: KYPHOPLASTY: SHX5884

## 2023-10-10 LAB — SURGICAL PCR SCREEN
MRSA, PCR: NEGATIVE
Staphylococcus aureus: NEGATIVE

## 2023-10-10 SURGERY — KYPHOPLASTY
Anesthesia: General | Laterality: Bilateral

## 2023-10-10 MED ORDER — MIDAZOLAM HCL 2 MG/2ML IJ SOLN
INTRAMUSCULAR | Status: AC
  Filled 2023-10-10: qty 2

## 2023-10-10 MED ORDER — ONDANSETRON HCL 4 MG/2ML IJ SOLN
INTRAMUSCULAR | Status: AC
  Filled 2023-10-10: qty 2

## 2023-10-10 MED ORDER — ROCURONIUM BROMIDE 10 MG/ML (PF) SYRINGE
PREFILLED_SYRINGE | INTRAVENOUS | Status: AC
  Filled 2023-10-10: qty 10

## 2023-10-10 MED ORDER — FENTANYL CITRATE (PF) 250 MCG/5ML IJ SOLN
INTRAMUSCULAR | Status: AC
  Filled 2023-10-10: qty 5

## 2023-10-10 MED ORDER — ONDANSETRON HCL 4 MG/2ML IJ SOLN
INTRAMUSCULAR | Status: DC | PRN
  Administered 2023-10-10: 4 mg via INTRAVENOUS

## 2023-10-10 MED ORDER — CHLORHEXIDINE GLUCONATE CLOTH 2 % EX PADS: 6.0000 | MEDICATED_PAD | Freq: Once | CUTANEOUS | Status: DC

## 2023-10-10 MED ORDER — CHLORHEXIDINE GLUCONATE 0.12 % MT SOLN
15.0000 mL | Freq: Once | OROMUCOSAL | Status: AC
  Administered 2023-10-10: 15 mL via OROMUCOSAL
  Filled 2023-10-10: qty 15

## 2023-10-10 MED ORDER — LIDOCAINE 2% (20 MG/ML) 5 ML SYRINGE
INTRAMUSCULAR | Status: AC
  Filled 2023-10-10: qty 5

## 2023-10-10 MED ORDER — LIDOCAINE-EPINEPHRINE 1 %-1:100000 IJ SOLN
INTRAMUSCULAR | Status: DC | PRN
  Administered 2023-10-10: 5 mL

## 2023-10-10 MED ORDER — EPHEDRINE SULFATE-NACL 50-0.9 MG/10ML-% IV SOSY
PREFILLED_SYRINGE | INTRAVENOUS | Status: DC | PRN
  Administered 2023-10-10: 5 mg via INTRAVENOUS

## 2023-10-10 MED ORDER — FENTANYL CITRATE (PF) 250 MCG/5ML IJ SOLN
INTRAMUSCULAR | Status: DC | PRN
  Administered 2023-10-10 (×2): 50 ug via INTRAVENOUS

## 2023-10-10 MED ORDER — LIDOCAINE-EPINEPHRINE 1 %-1:100000 IJ SOLN
INTRAMUSCULAR | Status: AC
  Filled 2023-10-10: qty 1

## 2023-10-10 MED ORDER — ONDANSETRON HCL 4 MG/2ML IJ SOLN: 4.0000 mg | Freq: Four times a day (QID) | INTRAMUSCULAR | Status: DC | PRN

## 2023-10-10 MED ORDER — FENTANYL CITRATE (PF) 100 MCG/2ML IJ SOLN: 25.0000 ug | INTRAMUSCULAR | Status: DC | PRN

## 2023-10-10 MED ORDER — ROCURONIUM BROMIDE 10 MG/ML (PF) SYRINGE
PREFILLED_SYRINGE | INTRAVENOUS | Status: DC | PRN
  Administered 2023-10-10: 40 mg via INTRAVENOUS

## 2023-10-10 MED ORDER — PROPOFOL 10 MG/ML IV BOLUS
INTRAVENOUS | Status: AC
  Filled 2023-10-10: qty 20

## 2023-10-10 MED ORDER — ORAL CARE MOUTH RINSE: 15.0000 mL | Freq: Once | OROMUCOSAL | Status: AC

## 2023-10-10 MED ORDER — SUGAMMADEX SODIUM 200 MG/2ML IV SOLN
INTRAVENOUS | Status: DC | PRN
  Administered 2023-10-10: 200 mg via INTRAVENOUS

## 2023-10-10 MED ORDER — 0.9 % SODIUM CHLORIDE (POUR BTL) OPTIME
TOPICAL | Status: DC | PRN
  Administered 2023-10-10: 1000 mL

## 2023-10-10 MED ORDER — LACTATED RINGERS IV SOLN: INTRAVENOUS | Status: DC

## 2023-10-10 MED ORDER — TRAMADOL HCL 50 MG PO TABS
50.0000 mg | ORAL_TABLET | Freq: Four times a day (QID) | ORAL | 0 refills | Status: AC | PRN
  Filled 2023-10-10: qty 12, 3d supply, fill #0

## 2023-10-10 MED ORDER — PROPOFOL 10 MG/ML IV BOLUS
INTRAVENOUS | Status: DC | PRN
  Administered 2023-10-10: 90 mg via INTRAVENOUS

## 2023-10-10 MED ORDER — LIDOCAINE 2% (20 MG/ML) 5 ML SYRINGE
INTRAMUSCULAR | Status: DC | PRN
  Administered 2023-10-10: 80 mg via INTRAVENOUS

## 2023-10-10 MED ORDER — CEFAZOLIN SODIUM-DEXTROSE 2-4 GM/100ML-% IV SOLN
2.0000 g | INTRAVENOUS | Status: AC
  Administered 2023-10-10: 2 g via INTRAVENOUS
  Filled 2023-10-10: qty 100

## 2023-10-10 MED ORDER — OXYCODONE HCL 5 MG PO TABS: 5.0000 mg | ORAL_TABLET | Freq: Once | ORAL | Status: DC | PRN

## 2023-10-10 MED ORDER — LABETALOL HCL 5 MG/ML IV SOLN
INTRAVENOUS | Status: DC | PRN
  Administered 2023-10-10: 5 mg via INTRAVENOUS

## 2023-10-10 MED ORDER — OXYCODONE HCL 5 MG/5ML PO SOLN: 5.0000 mg | Freq: Once | ORAL | Status: DC | PRN

## 2023-10-10 MED ORDER — PHENYLEPHRINE HCL-NACL 20-0.9 MG/250ML-% IV SOLN
INTRAVENOUS | Status: DC | PRN
  Administered 2023-10-10: 30 ug/min via INTRAVENOUS

## 2023-10-10 MED ORDER — DEXAMETHASONE SODIUM PHOSPHATE 10 MG/ML IJ SOLN
INTRAMUSCULAR | Status: DC | PRN
  Administered 2023-10-10: 10 mg via INTRAVENOUS

## 2023-10-10 MED ORDER — DEXAMETHASONE SODIUM PHOSPHATE 10 MG/ML IJ SOLN
INTRAMUSCULAR | Status: AC
Start: 2023-10-10 — End: ?
  Filled 2023-10-10: qty 1

## 2023-10-10 SURGICAL SUPPLY — 36 items
BAG COUNTER SPONGE SURGICOUNT (BAG) ×2 IMPLANT
BLADE CLIPPER SURG (BLADE) IMPLANT
BLADE SURG 15 STRL LF DISP TIS (BLADE) ×2 IMPLANT
BLADE SURG 15 STRL SS (BLADE) ×1
CEMENT KYPHON C01A KIT/MIXER (Cement) IMPLANT
DERMABOND ADVANCED .7 DNX12 (GAUZE/BANDAGES/DRESSINGS) ×2 IMPLANT
DRAPE C-ARM 42X72 X-RAY (DRAPES) ×2 IMPLANT
DRAPE HALF SHEET 40X57 (DRAPES) ×2 IMPLANT
DRAPE INCISE IOBAN 66X45 STRL (DRAPES) ×2 IMPLANT
DRAPE LAPAROTOMY 100X72X124 (DRAPES) ×2 IMPLANT
DRAPE SURG 17X23 STRL (DRAPES) ×2 IMPLANT
DRAPE WARM FLUID 44X44 (DRAPES) ×2 IMPLANT
DURAPREP 26ML APPLICATOR (WOUND CARE) ×2 IMPLANT
GAUZE 4X4 16PLY ~~LOC~~+RFID DBL (SPONGE) ×2 IMPLANT
GLOVE BIOGEL PI IND STRL 7.5 (GLOVE) ×2 IMPLANT
GLOVE ECLIPSE 7.0 STRL STRAW (GLOVE) ×2 IMPLANT
GLOVE EXAM NITRILE XL STR (GLOVE) IMPLANT
GOWN STRL REUS W/ TWL LRG LVL3 (GOWN DISPOSABLE) ×4 IMPLANT
GOWN STRL REUS W/ TWL XL LVL3 (GOWN DISPOSABLE) IMPLANT
GOWN STRL REUS W/TWL 2XL LVL3 (GOWN DISPOSABLE) IMPLANT
GOWN STRL REUS W/TWL LRG LVL3 (GOWN DISPOSABLE) ×2
GOWN STRL REUS W/TWL XL LVL3 (GOWN DISPOSABLE)
KIT BASIN OR (CUSTOM PROCEDURE TRAY) ×2 IMPLANT
KIT TURNOVER KIT B (KITS) ×2 IMPLANT
NDL HYPO 25X1 1.5 SAFETY (NEEDLE) ×2 IMPLANT
NEEDLE HYPO 25X1 1.5 SAFETY (NEEDLE) ×1 IMPLANT
NS IRRIG 1000ML POUR BTL (IV SOLUTION) ×2 IMPLANT
PACK BASIC III (CUSTOM PROCEDURE TRAY) ×1
PACK SRG BSC III STRL LF ECLPS (CUSTOM PROCEDURE TRAY) ×2 IMPLANT
PAD ARMBOARD 7.5X6 YLW CONV (MISCELLANEOUS) ×6 IMPLANT
SPECIMEN JAR SMALL (MISCELLANEOUS) IMPLANT
SUT VICRYL 3-0 RB1 18 ABS (SUTURE) ×2 IMPLANT
SYR CONTROL 10ML LL (SYRINGE) ×4 IMPLANT
TOWEL GREEN STERILE (TOWEL DISPOSABLE) ×2 IMPLANT
TOWEL GREEN STERILE FF (TOWEL DISPOSABLE) ×2 IMPLANT
TRAY KYPHOPAK 15/3 ONESTEP 1ST (MISCELLANEOUS) IMPLANT

## 2023-10-10 NOTE — Anesthesia Procedure Notes (Signed)
Procedure Name: Intubation Date/Time: 10/10/2023 11:29 AM  Performed by: Loleta Victorino Fatzinger, CRNAPre-anesthesia Checklist: Patient identified, Patient being monitored, Timeout performed, Emergency Drugs available and Suction available Patient Re-evaluated:Patient Re-evaluated prior to induction Oxygen Delivery Method: Circle system utilized Preoxygenation: Pre-oxygenation with 100% oxygen Induction Type: IV induction Ventilation: Mask ventilation without difficulty Laryngoscope Size: Mac and 4 Grade View: Grade I Tube type: Oral Tube size: 7.5 mm Number of attempts: 1 Airway Equipment and Method: Stylet Placement Confirmation: ETT inserted through vocal cords under direct vision, positive ETCO2 and breath sounds checked- equal and bilateral Secured at: 23 cm Tube secured with: Tape Dental Injury: Teeth and Oropharynx as per pre-operative assessment

## 2023-10-10 NOTE — Op Note (Signed)
PREOP DIAGNOSIS: T8 pathologic compression fx   POSTOP DIAGNOSIS: Same  PROCEDURE: 1. T8 Kyphoplasty  SURGEON: Dr. Lisbeth Renshaw, MD  ASSISTANT: None  ANESTHESIA: GETA  EBL: Minimal  SPECIMENS: None  DRAINS: None  COMPLICATIONS: None immediate  CONDITION: Hemodynamically stable to PCAU  HISTORY: Edwin Webster is a 62 y.o. yo male with a history of Waldenstrm's macroglobulinemia.  He presented the office with sudden onset severe midline thoracic back pain.  His MRI revealed a nonhealing T8 compression fracture.  He therefore elected to proceed with vertebral augmentation.  The risks, benefits, and alternatives to surgery were all reviewed in detail with the patient.  After all questions were answered informed consent was obtained and witnessed.  PROCEDURE IN DETAIL: After informed consent was obtained and witnessed, the patient was brought to the operating room. After induction of anesthesia, the patient was positioned on the operative table in the prone position. All pressure points were meticulously padded. Fluoroscopy was used to mark out the projection of the T8 pedicles on the skin. Skin incision was then marked out and prepped and draped in the usual sterile fashion.  After time out was conducted, left sided stab incision was made and Jamshidi needles was introduced. Under AP and lateral fluoroscopic guidance, the left pedicle was canulated. Drill was then used to create a channel and the kyphoplasty balloon was placed and expanded to create a cavity. Approximately 3cc of PMMA cement was injected under fluoroscopy, taking care to preserve the posterior cortex. Trocar was replaced and the Jamshidi needles removed.  Stab incision was then closed with 3-0 vicryl suture and standard skin glue. The patient was then transferred to the stretcher and taken to the PACU in stable hemodynamic condition.  At the end of the case all sponge, needle, and instrument counts were  correct.   Lisbeth Renshaw, MD [facility]

## 2023-10-10 NOTE — Discharge Summary (Signed)
Physician Discharge Summary  Patient ID: Edwin Webster MRN: [id number] DOB/AGE: 1961-07-26 62 y.o.  Admit date: 10/10/2023 Discharge date: 10/10/2023  Admission Diagnoses:  T8 pathologic compression fracture  Discharge Diagnoses:  Same Active Problems:   * No active hospital problems. *   Discharged Condition: Stable  Hospital Course:  Edwin Webster is a 62 y.o. male who underwent uncomplicated T8 kyphoplasty.  He was at baseline in the PACU and discharged home in stable condition.  Treatments: Surgery -T8 kyphoplasty  Discharge Exam: Blood pressure (!) 187/88, pulse 64, temperature 97.8 F (36.6 C), resp. rate 13, height 5\' 10"  (1.778 m), weight 70.3 kg, SpO2 98%. Awake, alert, oriented Speech fluent, appropriate CN grossly intact 5/5 BUE/BLE Wound c/d/i  Disposition: Discharge disposition: 01-Home or Self Care       Discharge Instructions     Call MD for:  redness, tenderness, or signs of infection (pain, swelling, redness, odor or green/yellow discharge around incision site)   Complete by: As directed    Call MD for:  temperature >100.4   Complete by: As directed    Diet - low sodium heart healthy   Complete by: As directed    Discharge instructions   Complete by: As directed    Walk at home as much as possible, at least 4 times / day   Increase activity slowly   Complete by: As directed    Lifting restrictions   Complete by: As directed    No lifting > 10 lbs   May shower / Bathe   Complete by: As directed    48 hours after surgery   May walk up steps   Complete by: As directed    Other Restrictions   Complete by: As directed    No bending/twisting at waist      Allergies as of 10/10/2023       Reactions   Adalimumab Other (See Comments)   "blacked out", confused        Medication List     TAKE these medications    acetaminophen 650 MG CR tablet Commonly known as: TYLENOL Take 650 mg by mouth every 8 (eight) hours as needed for  pain.   acyclovir 200 MG capsule Commonly known as: ZOVIRAX Take 1 capsule (200 mg total) by mouth 2 (two) times daily.   alendronate 70 MG tablet Commonly known as: FOSAMAX Take 1 tablet (70 mg total) by mouth every 7 (seven) days. Take with a full glass of water on an empty stomach and avoid reclining or lying down for 2 hours after taking this medication.   baclofen 10 MG tablet Commonly known as: LIORESAL TAKE 1 /2 TO 1 TABLET 2 X /DAY IF NEEDED FOR MUSCLE SPASM   betamethasone dipropionate 0.05 % ointment Commonly known as: DIPROLENE Apply topically 2 (two) times daily. What changed:  how much to take when to take this reasons to take this   bisoprolol 10 MG tablet Commonly known as: ZEBETA Take   1 tablet  every Morning  for BP   busPIRone 7.5 MG tablet Commonly known as: BUSPAR TAKE 1 TABLET 3 X /DAY FOR CHRONIC ANXIETY   escitalopram 20 MG tablet Commonly known as: LEXAPRO Take 20 mg by mouth daily.   ezetimibe 10 MG tablet Commonly known as: ZETIA Take 1 tablet (10 mg total) by mouth daily. Please keep scheduled appointment   famciclovir 500 MG tablet Commonly known as: FAMVIR Take 500 mg by mouth daily.   folic acid  1 MG tablet Commonly known as: FOLVITE TAKE 1 TABLET DAILY FOR FOLATE DEFICIENCY   ibuprofen 800 MG tablet Commonly known as: ADVIL Take 1 tablet (800 mg total) by mouth every 8 (eight) hours as needed.   ipratropium 0.06 % nasal spray Commonly known as: ATROVENT Use 1 to 2 sprays each nostril 2 to 3 x /day as needed   levocetirizine 5 MG tablet Commonly known as: XYZAL TAKE 1 TABLET BY MOUTH DAILY FOR ALLERGIES   olmesartan 40 MG tablet Commonly known as: BENICAR Take 1 tablet (40 mg total) by mouth at bedtime. For BP   rosuvastatin 40 MG tablet Commonly known as: CRESTOR TAKE 1 TABLET BY MOUTH EVERY DAY FOR CHOLESTEROL   traMADol 50 MG tablet Commonly known as: Ultram Take 1 tablet (50 mg total) by mouth every 6 (six) hours  as needed for up to 3 days.   triamcinolone cream 0.1 % Commonly known as: KENALOG Apply  to skin  Rash   2 x /day   as needed   Vitamin D-3 125 MCG (5000 UT) Tabs Take 5,000 Units by mouth daily.        Follow-up Information     Lisbeth Renshaw, MD Follow up in 3 week(s).   Specialty: Neurosurgery Contact information: [address] [place] [address] [phone]                 Signed: Jackelyn Hoehn 10/10/2023, 12:35 PM

## 2023-10-10 NOTE — H&P (Signed)
Chief Complaint   No chief complaint on file.   History of Present Illness  Edwin Webster is a 62 year old man I have seen for initial consultation at the request of Dr. Emilia Beck. The patient comes in today for evaluation of midline low back pain which began this summer rather acutely when he was bending over to work on his Chlorine pump on his pool. He said he stood up and felt sudden, sharp pain in the middle of his back. He does not report any significant radiation into the paraspinal region nor any radiation down into the lower back or legs. The patient reports similar episode of severe low back pain about one year ago when he was helping his daughter moved. At the time, he was told he had compression fractures and his pain resolved spontaneously over the course of several weeks. Unfortunately, the patient does not report any significant improvement in his pain over the last four weeks, possibly reporting worsening of symptoms. He was seen in the emergency department about a day after the onset of pain where CT scans did not reveal any acute changes in comparison to previous CT scans. He was referred for surgical evaluation.   It took a while to obtain the MRI ordered because of his implanted pacemaker. This did reveal non-healing T8 pathologic fracture. He has therefore elected to proceed with vertebral augmentation.  Of note, the patient does report a history of hypertension. 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 Myna Hidalgo, currently on a targeted therapy. He denies any previous heart attack or stroke. No known lung, liver, kidney disease. He is not on any blood thinners. He quit smoking many years ago.   Past Medical History   Past Medical History:  Diagnosis Date   Allergy    seasonal allergies   Anemia    Anxiety    on meds   Arthritis    Chest pain,  non-cardiac    COPD (chronic obstructive pulmonary disease) (HCC)    past smoker   Counseling regarding goals of care 03/27/2018   Depression    on meds   Diverticulitis    Diverticulosis    Fractured pelvis (HCC)    fall from ladder   GERD (gastroesophageal reflux disease)    on meds   Gout    History of shingles    Hyperlipidemia    on meds   Hypertension    on meds   Internal hemorrhoids    Iron deficiency anemia 03/13/2018   Kidney stones    Osteoporosis    Other abnormal glucose 10/11/2015   Presence of permanent cardiac pacemaker    Rheumatoid arthritis (HCC)    hands   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    Past Surgical History   Past Surgical History:  Procedure Laterality Date   COLONOSCOPY  2016   JMP-suprep(exc)-mild TICS/int hems/normal-5 yr recall   FINGER SURGERY Right    ring finger   iron infusion     KNEE SURGERY     right   PACEMAKER IMPLANT N/A 08/10/2020   Procedure: PACEMAKER IMPLANT;  Surgeon: Regan Lemming, MD;  Location: [facility];  Service: Cardiovascular;  Laterality: N/A;   ROTATOR CUFF REPAIR  1996   left   WISDOM TOOTH EXTRACTION      Social History   Social History   Tobacco Use   Smoking  status: Former    Current packs/day: 0.00    Types: Cigarettes    Quit date: 07/03/2010    Years since quitting: 13.2   Smokeless tobacco: Never  Vaping Use   Vaping status: Never Used  Substance Use Topics   Alcohol use: No    Alcohol/week: 0.0 standard drinks of alcohol   Drug use: No    Medications   Prior to Admission medications   Medication Sig Start Date End Date Taking? Authorizing Provider  acetaminophen (TYLENOL) 650 MG CR tablet Take 650 mg by mouth every 8 (eight) hours as needed for pain.   Yes [provider]  alendronate (FOSAMAX) 70 MG tablet Take 1 tablet (70 mg total) by mouth every 7 (seven) days. Take with a full glass of water on an empty stomach and avoid reclining or lying down  for 2 hours after taking this medication. 08/19/23 08/18/24 Yes Raynelle Dick, NP  betamethasone dipropionate (DIPROLENE) 0.05 % ointment Apply topically 2 (two) times daily. Patient taking differently: Apply 1 application  topically daily as needed (Itching). 05/31/23  Yes Terrilee Files, MD  Cholecalciferol (VITAMIN D-3) 5000 UNITS TABS Take 5,000 Units by mouth daily.   Yes [provider]  escitalopram (LEXAPRO) 20 MG tablet Take 20 mg by mouth daily.   Yes [provider]  ezetimibe (ZETIA) 10 MG tablet Take 1 tablet (10 mg total) by mouth daily. Please keep scheduled appointment 12/07/22  Yes Camnitz, Will Daphine Deutscher, MD  famciclovir (FAMVIR) 500 MG tablet Take 500 mg by mouth daily.   Yes [provider]  folic acid (FOLVITE) 1 MG tablet TAKE 1 TABLET DAILY FOR FOLATE DEFICIENCY 06/03/23  Yes Raynelle Dick, NP  olmesartan (BENICAR) 40 MG tablet Take 1 tablet (40 mg total) by mouth at bedtime. For BP 06/03/23  Yes Cranford, Archie Patten, NP  rosuvastatin (CRESTOR) 40 MG tablet TAKE 1 TABLET BY MOUTH EVERY DAY FOR CHOLESTEROL 11/27/22  Yes Raynelle Dick, NP  acyclovir (ZOVIRAX) 200 MG capsule Take 1 capsule (200 mg total) by mouth 2 (two) times daily. Patient not taking: Reported on 10/07/2023 09/23/23   [name], MD  baclofen (LIORESAL) 10 MG tablet TAKE 1 /2 TO 1 TABLET 2 X /DAY IF NEEDED FOR MUSCLE SPASM Patient not taking: Reported on 10/07/2023 08/22/22   Raynelle Dick, NP  bisoprolol (ZEBETA) 10 MG tablet Take   1 tablet  every Morning  for BP Patient not taking: Reported on 10/07/2023 04/04/23   Lucky Cowboy, MD  busPIRone (BUSPAR) 7.5 MG tablet TAKE 1 TABLET 3 X /DAY FOR CHRONIC ANXIETY Patient not taking: Reported on 10/07/2023 06/01/23   Lucky Cowboy, MD  ibuprofen (ADVIL) 800 MG tablet Take 1 tablet (800 mg total) by mouth every 8 (eight) hours as needed. 07/25/23   Adela Glimpse, NP  ipratropium (ATROVENT) 0.06 % nasal spray Use 1 to 2 sprays  each nostril 2 to 3 x /day as needed 02/28/21   Lucky Cowboy, MD  levocetirizine (XYZAL) 5 MG tablet TAKE 1 TABLET BY MOUTH DAILY FOR ALLERGIES Patient not taking: Reported on 10/07/2023 02/08/23   Raynelle Dick, NP  triamcinolone cream (KENALOG) 0.1 % Apply  to skin  Rash   2 x /day   as needed Patient not taking: Reported on 10/07/2023 03/20/23   Lucky Cowboy, MD    Allergies   Allergies  Allergen Reactions   Adalimumab Other (See Comments)    "blacked out", confused  Review of Systems  ROS  Neurologic Exam  Awake, alert, oriented Memory and concentration grossly intact Speech fluent, appropriate CN grossly intact Motor exam: Upper Extremities Deltoid Bicep Tricep Grip  Right 5/5 5/5 5/5 5/5  Left 5/5 5/5 5/5 5/5   Lower Extremities IP Quad PF DF EHL  Right 5/5 5/5 5/5 5/5 5/5  Left 5/5 5/5 5/5 5/5 5/5   Sensation grossly intact to LT  Imaging  MRI reveals non-healing wedge-compression fracture of T8 without bony retropulsion.  Impression  - 62 y.o. male with non-healing T8 pathologic fracture  Plan  - Will proceed with T8 kyphoplasty  I have reviewed the indications for the procedure as well as the details of the procedure and the expected postoperative course and recovery at length with the patient in the office. We have also reviewed in detail the risks, benefits, and alternatives to the procedure. All questions were answered and Edwin Webster provided informed consent to proceed.  Lisbeth Renshaw, MD [facility]

## 2023-10-10 NOTE — Transfer of Care (Signed)
Immediate Anesthesia Transfer of Care Note  Patient: Edwin Webster  Procedure(s) Performed: KYPHOPLASTY Thoracic eight (Bilateral)  Patient Location: PACU  Anesthesia Type:General  Level of Consciousness: awake  Airway & Oxygen Therapy: Patient Spontanous Breathing  Post-op Assessment: Report given to RN and Post -op Vital signs reviewed and stable  Post vital signs: Reviewed and stable  Last Vitals:  Vitals Value Taken Time  BP 139/86 10/10/23 1227  Temp    Pulse 64 10/10/23 1229  Resp 19 10/10/23 1229  SpO2 94 % 10/10/23 1229  Vitals shown include unfiled device data.  Last Pain:  Vitals:   10/10/23 0831  PainSc: 6          Complications: No notable events documented.

## 2023-10-11 ENCOUNTER — Ambulatory Visit: Payer: Medicare HMO | Admitting: Pulmonary Disease

## 2023-10-11 ENCOUNTER — Encounter (HOSPITAL_COMMUNITY): Payer: Self-pay | Admitting: Neurosurgery

## 2023-10-11 NOTE — Anesthesia Postprocedure Evaluation (Signed)
Anesthesia Post Note  Patient: Edwin Webster  Procedure(s) Performed: KYPHOPLASTY Thoracic eight (Bilateral)     Patient location during evaluation: PACU Anesthesia Type: General Level of consciousness: awake and alert Pain management: pain level controlled Vital Signs Assessment: post-procedure vital signs reviewed and stable Respiratory status: spontaneous breathing, nonlabored ventilation, respiratory function stable and patient connected to nasal cannula oxygen Cardiovascular status: blood pressure returned to baseline and stable Postop Assessment: no apparent nausea or vomiting Anesthetic complications: no   No notable events documented.  Last Vitals:  Vitals:   10/10/23 1245 10/10/23 1300  BP: (!) 146/97 (!) 153/83  Pulse: 62 62  Resp: 16 18  Temp:  (!) 36.2 C  SpO2: 97% 96%    Last Pain:  Vitals:   10/10/23 1300  PainSc: 0-No pain                 Earl Lites P Baylin Cabal

## 2023-10-17 ENCOUNTER — Other Ambulatory Visit (HOSPITAL_COMMUNITY): Payer: Self-pay

## 2023-10-17 ENCOUNTER — Encounter: Payer: Self-pay | Admitting: Family

## 2023-10-17 ENCOUNTER — Telehealth: Payer: Self-pay

## 2023-10-17 NOTE — Telephone Encounter (Signed)
Oral Oncology Patient Advocate Encounter   **Imbruvica [employer] PAP to [facility] in Jan 2025**  Was successful in securing patient an $3,800 grant from Patient [employer] [facility]) to provide copayment coverage for Imbruvica.  This will keep the out of pocket expense at $0.     I have spoken with the patient.  We will be transitioning fills over to [facility] for 2025.  The billing information is as follows and has been shared with [facility].   Member ID: [id number] Group ID: [id number] RxBin: [id number] Dates of Eligibility: 07/10/23 through 10/06/24  Fund:  Lanell Matar, CPhT Oncology Pharmacy Patient Advocate  [facility]  [phone] (phone) [phone] (fax) 10/17/2023 3:14 PM

## 2023-10-28 ENCOUNTER — Ambulatory Visit (INDEPENDENT_AMBULATORY_CARE_PROVIDER_SITE_OTHER): Payer: Medicare HMO | Admitting: Nurse Practitioner

## 2023-10-28 ENCOUNTER — Encounter: Payer: Self-pay | Admitting: Nurse Practitioner

## 2023-10-28 VITALS — BP 158/98 | HR 63 | Temp 98.2°F | Ht 68.0 in | Wt 165.0 lb

## 2023-10-28 DIAGNOSIS — Z6824 Body mass index (BMI) 24.0-24.9, adult: Secondary | ICD-10-CM

## 2023-10-28 DIAGNOSIS — Z79899 Other long term (current) drug therapy: Secondary | ICD-10-CM

## 2023-10-28 DIAGNOSIS — I10 Essential (primary) hypertension: Secondary | ICD-10-CM

## 2023-10-28 DIAGNOSIS — R7309 Other abnormal glucose: Secondary | ICD-10-CM

## 2023-10-28 DIAGNOSIS — L03115 Cellulitis of right lower limb: Secondary | ICD-10-CM

## 2023-10-28 DIAGNOSIS — Z0001 Encounter for general adult medical examination with abnormal findings: Secondary | ICD-10-CM

## 2023-10-28 DIAGNOSIS — D500 Iron deficiency anemia secondary to blood loss (chronic): Secondary | ICD-10-CM

## 2023-10-28 DIAGNOSIS — Z860100 Personal history of colon polyps, unspecified: Secondary | ICD-10-CM

## 2023-10-28 DIAGNOSIS — F3341 Major depressive disorder, recurrent, in partial remission: Secondary | ICD-10-CM

## 2023-10-28 DIAGNOSIS — I495 Sick sinus syndrome: Secondary | ICD-10-CM

## 2023-10-28 DIAGNOSIS — M0600 Rheumatoid arthritis without rheumatoid factor, unspecified site: Secondary | ICD-10-CM

## 2023-10-28 DIAGNOSIS — Z125 Encounter for screening for malignant neoplasm of prostate: Secondary | ICD-10-CM

## 2023-10-28 DIAGNOSIS — C8800 Waldenstrom macroglobulinemia not having achieved remission: Secondary | ICD-10-CM

## 2023-10-28 DIAGNOSIS — Z130 Encounter for screening for diseases of the blood and blood-forming organs and certain disorders involving the immune mechanism: Secondary | ICD-10-CM

## 2023-10-28 DIAGNOSIS — Z0000 Encounter for general adult medical examination without abnormal findings: Secondary | ICD-10-CM | POA: Diagnosis not present

## 2023-10-28 DIAGNOSIS — M810 Age-related osteoporosis without current pathological fracture: Secondary | ICD-10-CM

## 2023-10-28 DIAGNOSIS — S22060D Wedge compression fracture of T7-T8 vertebra, subsequent encounter for fracture with routine healing: Secondary | ICD-10-CM

## 2023-10-28 DIAGNOSIS — Z1389 Encounter for screening for other disorder: Secondary | ICD-10-CM

## 2023-10-28 DIAGNOSIS — I2510 Atherosclerotic heart disease of native coronary artery without angina pectoris: Secondary | ICD-10-CM

## 2023-10-28 DIAGNOSIS — E559 Vitamin D deficiency, unspecified: Secondary | ICD-10-CM

## 2023-10-28 DIAGNOSIS — Z85828 Personal history of other malignant neoplasm of skin: Secondary | ICD-10-CM

## 2023-10-28 DIAGNOSIS — E782 Mixed hyperlipidemia: Secondary | ICD-10-CM

## 2023-10-28 MED ORDER — DOXYCYCLINE HYCLATE 100 MG PO TABS: 100.0000 mg | ORAL_TABLET | Freq: Two times a day (BID) | ORAL | 0 refills | Status: AC

## 2023-10-28 NOTE — Patient Instructions (Signed)

## 2023-10-28 NOTE — Progress Notes (Signed)
CPE Assessment:   Edwin Webster was seen today for an annual CPE.  Diagnoses and all orders for this visit:  CPE Due annually  Health maintenance reviewed Healthily lifestyle goals set   CAD Cardiology following  Control blood pressure, cholesterol, glucose, increase exercise.  Denies angina Continue lipid management for miminum LDL <70, ASA  Primary hypertension Continue medication Discussed DASH (Dietary Approaches to Stop Hypertension) DASH diet is lower in sodium than a typical American diet. Cut back on foods that are high in saturated fat, cholesterol, and trans fats. Eat more whole-grain foods, fish, poultry, and nuts Remain active and exercise as tolerated daily.  Monitor BP at home-Call if greater than 130/80.  Check CMP/CBC  Sick sinus syndrome S/p pacemaker, cardiology follows - has upcoming appointment - defer EKG today.  Seronegative rheumatoid arthritis (HCC) Off of methotrexate; using NSAID prn; discussed voltaren  Waldenstrom's macroglobulinemia (HCC) Oncology following; recently well controlled  Major depressive disorder, recurrent episode, mild with anxious distress (HCC) Continue medications  Lifestyle discussed: diet/exerise, sleep hygiene, stress management, hydration  Hyperlipidemia Continue medication Discussed lifestyle modifications. Recommended diet heavy in fruits and veggies, omega 3's. Decrease consumption of animal meats, cheeses, and dairy products. Remain active and exercise as tolerated. Continue to monitor. Check lipids/TSH  Vitamin D deficiency At goal at recent check; continue to recommend supplementation for goal of 60-100 Defer vitamin D level  Medication management All medications discussed and reviewed in full. All questions and concerns regarding medications addressed.    Abnormal glucose Recent A1Cs at goal Education: Reviewed 'ABCs' of diabetes management  Discussed goals to be met and/or maintained include A1C  (<7) Blood pressure (<130/80) Cholesterol (LDL <70) Continue Eye Exam yearly  Continue Dental Exam Q6 mo Discussed dietary recommendations Discussed Physical Activity recommendations Check A1C  BMI 24.0-24.9, adult  Discussed appropriate BMI Diet modification. Physical activity. Encouraged/praised to build confidence.  Iron deficiency anemia due to chronic blood loss Supplement iron as needed; hematology is following  History of basal cell cancer Derm following annually   History of colon polyps High fiber diet, next due in 2029 per Dr. Rhea Belton  Compression fracture, T12 [facility])  Resolved Was not recommended kyphoplasty  Managing with tylenol and gabapentin for now  Compression fracture T8 S/p kyphoplasty Healing well  Osteoporosis Repeat DEXA 2 years, continue Vit D and Ca, weight bearing exercises On alendronate and tolerating well  DEXA order current and pending - patient to complete  Right leg cellulitis Start Doxycycline as directed Continue to monitor for increase in redness, swelling, streaking, fever, chills, N/V. RICE Method  Orders Placed This Encounter  Procedures   Magnesium   Lipid panel   TSH   Hemoglobin A1c   Insulin, random   VITAMIN D 25 Hydroxy (Vit-D Deficiency, Fractures)   Urinalysis, Routine w reflex microscopic   Microalbumin / creatinine urine ratio   Vitamin B12   PSA    Notify office for further evaluation and treatment, questions or concerns if any reported s/s fail to improve.   The patient was advised to call back or seek an in-person evaluation if any symptoms worsen or if the condition fails to improve as anticipated.   Further disposition pending results of labs. Discussed med's effects and SE's.    I discussed the assessment and treatment plan with the patient. The patient was provided an opportunity to ask questions and all were answered. The patient agreed with the plan and demonstrated an understanding of the  instructions.  Discussed med's effects  and SE's. Screening labs and tests as requested with regular follow-up as recommended.  I provided 35 minutes of face-to-face time during this encounter including counseling, chart review, and critical decision making was preformed.   Future Appointments  Date Time Provider Department Center  10/29/2023 11:00 AM Jeanella Craze, NP [facility]  11/07/2023  7:10 AM [facility] DEVICE REMOTES [facility]  12/24/2023 10:00 AM [facility] LAB [facility] None  12/24/2023 10:15 AM Ennever, Rose Phi, MD [facility] None  02/06/2024  7:10 AM [facility] DEVICE REMOTES [facility]  05/07/2024  7:10 AM [facility] DEVICE REMOTES [facility]  08/06/2024  7:10 AM [facility] DEVICE REMOTES [facility]  11/05/2024  7:10 AM [facility] DEVICE REMOTES [facility]  11/06/2024 11:00 AM Lucky Cowboy, MD [facility] None     Plan:   During the course of the visit the patient was educated and counseled about appropriate screening and preventive services including:   Pneumococcal vaccine  Influenza vaccine Prevnar 13 Td vaccine Screening electrocardiogram Colorectal cancer screening Diabetes screening Glaucoma screening Nutrition counseling    Subjective:  Edwin Webster is a 62 y.o. male who presents for Medicare Annual Wellness Visit and 3 month follow up for HTN, hyperlipidemia, glucose management, and vitamin D Def.   Overall he reports feeling well.    He is s/p approximately 2 weeks (10/10/23) kyphoplasty thoracic eight via Dr. Lisbeth Renshaw, Neurosurgery.  Healing well, no pain, incision well healed.  States that two days after his surgery on 10/12/23 he tripped over a dog gate and trapped his right shin falling forward landing on his bilateral wrists.  He did not land on his chest or his back.  States right shin was scrapped by the gate. Has linear cut down the front of the shin. He  developed a large hematoma along the mid-anterior shin which has since dissolved however continues to have mild edema and erythema to the area despite implementing the RICE method.  He has noticed some improvement. He felt as though abx from surgery would help any development for infection.  Denies fever, chills, N/V.   He has basal cell to his nose, had removal scheduled at skin surgery center in Jan 2021 and continues with annual follow up.   Pateint has as sero-negative RA by U/S with negative RA factor & Anti-CCP and is followed by Dr Lendon Colonel. Off of methotrexate, takes NSAID PRN, mostly inhands, and doing well.   He was diagnosed with Waldenstrom's macroglobulinemia 2019 after persistent unexplained fatigue; he now follows with Dr. Myna Hidalgo, Rituxan/bendamustine-s/p cycle 2, Imbruvica 420 mg po q day and reports significant improvement with this, ECOG of 0, most notably 12/17/22 by Dr. Myna Hidalgo.   He has iron deficiency anemia that is also monitored, IV iron as indicated. He has no problems with bleeding, no changes to bowel or bladder habits. Denies fever, SOB.  Does have occasional fatigue. Son has CLL.    Patient is on Alendronate for a thoracic Compression fx (T12, L1) due to Osteoporosis. He did see ortho Dr. Ophelia Charter but was released to PRN follow up, was advised not candidate for kyphoplasty and was released however, recently had kyphoplasty completed. Has pain but managing with tylenol and gabapentin 300 mg TID. He is due for bone density scan last 2022 and WNL.  He is currently taking alendronate.   he has a diagnosis of depression/anxiety and is currently on lexapro 20 mg daily, buspar 5 mg TID PRN, takes BID per patient, been able to taper off of xanax.  BMI is Body mass index is 25.09 kg/m., he has been working on diet and exercise. Continues to remain active on property.    Wt Readings from Last 3 Encounters:  10/28/23 165 lb (74.8 kg)  10/10/23 155 lb (70.3 kg)  09/23/23 161 lb (73 kg)    On Sept 2021 Coronary CTA found NonIschemic CAD and also in Sept 2021 had a PPM inserted for symptomatic Bradycardia/sick sinus syndrome( Dr Elberta Fortis - Cardiologist )  His blood pressure has been controlled at home, today their BP is   He does workout. He denies chest pain, shortness of breath, dizziness.   He is on cholesterol medication (rosuvastatin 40 mg daily and zetia 10 mg daily) and denies myalgias. His cholesterol is at goal. The cholesterol last visit was:   Lab Results  Component Value Date   CHOL 194 04/04/2023   HDL 61 04/04/2023   LDLCALC 115 (H) 04/04/2023   TRIG 85 04/04/2023   CHOLHDL 3.2 04/04/2023   He has been working on diet and exercise for glucose management, and denies nausea, paresthesia of the feet, polydipsia, polyuria, visual disturbances and vomiting. Last A1C in the office was:  Lab Results  Component Value Date   HGBA1C 5.5 04/04/2023   Last GFR Lab Results  Component Value Date   GFRNONAA >60 09/23/2023   Patient is on Vitamin D supplement, taking 16109 IU dailydaily only Lab Results  Component Value Date   VD25OH 54 04/04/2023        Latest Ref Rng & Units 09/23/2023    8:07 AM 06/18/2023    9:48 AM 04/04/2023   10:42 AM  CBC  WBC 4.0 - 10.5 K/uL 4.8  4.6  4.8   Hemoglobin 13.0 - 17.0 g/dL 60.4  54.0  98.1   Hematocrit 39.0 - 52.0 % 43.8  39.6  43.8   Platelets 150 - 400 K/uL 247  203  242    Lab Results  Component Value Date   IRON 141 09/23/2023   TIBC 416 09/23/2023   FERRITIN 23 (L) 09/23/2023     Medication Review:  Current Outpatient Medications (Endocrine & Metabolic):    alendronate (FOSAMAX) 70 MG tablet, Take 1 tablet (70 mg total) by mouth every 7 (seven) days. Take with a full glass of water on an empty stomach and avoid reclining or lying down for 2 hours after taking this medication.  Current Outpatient Medications (Cardiovascular):    bisoprolol (ZEBETA) 10 MG tablet, Take   1 tablet  every Morning  for BP    ezetimibe (ZETIA) 10 MG tablet, Take 1 tablet (10 mg total) by mouth daily. Please keep scheduled appointment   olmesartan (BENICAR) 40 MG tablet, Take 1 tablet (40 mg total) by mouth at bedtime. For BP   rosuvastatin (CRESTOR) 40 MG tablet, TAKE 1 TABLET BY MOUTH EVERY DAY FOR CHOLESTEROL  Current Outpatient Medications (Respiratory):    ipratropium (ATROVENT) 0.06 % nasal spray, Use 1 to 2 sprays each nostril 2 to 3 x /day as needed   levocetirizine (XYZAL) 5 MG tablet, TAKE 1 TABLET BY MOUTH DAILY FOR ALLERGIES  Current Outpatient Medications (Analgesics):    acetaminophen (TYLENOL) 650 MG CR tablet, Take 650 mg by mouth every 8 (eight) hours as needed for pain.   ibuprofen (ADVIL) 800 MG tablet, Take 1 tablet (800 mg total) by mouth every 8 (eight) hours as needed.  Current Outpatient Medications (Hematological):    folic acid (FOLVITE) 1 MG tablet, TAKE 1 TABLET DAILY  FOR FOLATE DEFICIENCY  Current Outpatient Medications (Other):    betamethasone dipropionate (DIPROLENE) 0.05 % ointment, Apply topically 2 (two) times daily. (Patient taking differently: Apply 1 application  topically daily as needed (Itching).)   busPIRone (BUSPAR) 7.5 MG tablet, TAKE 1 TABLET 3 X /DAY FOR CHRONIC ANXIETY   Cholecalciferol (VITAMIN D-3) 5000 UNITS TABS, Take 5,000 Units by mouth daily.   escitalopram (LEXAPRO) 20 MG tablet, Take 20 mg by mouth daily.   famciclovir (FAMVIR) 500 MG tablet, Take 500 mg by mouth daily.   triamcinolone cream (KENALOG) 0.1 %, Apply  to skin  Rash   2 x /day   as needed   acyclovir (ZOVIRAX) 200 MG capsule, Take 1 capsule (200 mg total) by mouth 2 (two) times daily. (Patient not taking: Reported on 10/07/2023)   baclofen (LIORESAL) 10 MG tablet, TAKE 1 /2 TO 1 TABLET 2 X /DAY IF NEEDED FOR MUSCLE SPASM (Patient not taking: Reported on 10/07/2023)  Allergies: Allergies  Allergen Reactions   Adalimumab Other (See Comments)    "blacked out", confused     Current Problems  (verified) has Hyperlipidemia; Hypertension; Vitamin D deficiency; Medication management; Seronegative rheumatoid arthritis (HCC); Abnormal glucose; BMI 24.0-24.9, adult; Gonalgia; Ventral hernia; Iron deficiency anemia; Waldenstrom's macroglobulinemia; Major depressive disorder, recurrent episode, in partial remission (HCC); Sinus bradycardia; History of basal cell cancer; Compression fracture of T12 vertebra (HCC); Osteoporosis; History of colon polyps; Coronary artery disease involving native coronary artery of native heart without angina pectoris; Sick sinus syndrome (HCC); and Left renal stone on their problem list.  Screening Tests Immunization History  Administered Date(s) Administered   DTaP 06/04/2011   Hepatitis B 11/26/2010   Hepatitis B, PED/ADOLESCENT 08/06/2011   Influenza Inj Mdck Quad Pf 09/28/2023   Influenza Inj Mdck Quad With Preservative 09/13/2017, 08/21/2018, 09/01/2020   Influenza Split 09/02/2014   Influenza Whole 09/17/2013   Influenza, High Dose Seasonal PF 09/26/2022   Influenza, Seasonal, Injecte, Preservative Fre 10/11/2015   Influenza-Unspecified 09/13/2017   Moderna Covid-19 Fall Seasonal Vaccine 64yrs & older 09/28/2023   PFIZER(Purple Top)SARS-COV-2 Vaccination 01/24/2020, 03/06/2020, 08/03/2020   PNEUMOCOCCAL CONJUGATE-20 12/06/2021   PPD Test 10/11/2015, 08/05/2019, 09/01/2020   Pneumococcal Polysaccharide-23 12/06/2020    Preventative care: Last colonoscopy: 02/24/2021, hx of polyps, Dr. Rhea Belton, 7 year follow up - due 2029  DEXA: 06/22/2021, T -2.9, on osteoporosis, on alendronate - Due - ordered   Prior vaccinations: TD or Tdap: 05/2011 will get PRN due to cost Influenza: 09/2022 at pharmacy, will send report Pneumococcal: 12/06/2020 Prevnar 20: 2022 due to immunosuppressed Shingles/Zostavax: contraindicated, takes famciclovir daily prophylaxis  Covid 19: 2/2 + booster x 2, most recent requested  Names of Other Physician/Practitioners you currently  use: 1. [facility] here for primary care 2. Dr. Nile Riggs, eye doctor, last visit 2024, glasses 3. none, dentist, last visit remote, full dentures  Patient Care Team: Lucky Cowboy, MD as PCP - General (Internal Medicine) Regan Lemming, MD as PCP - Electrophysiology (Cardiology)  Surgical: He  has a past surgical history that includes Rotator cuff repair (1996); Knee surgery; Finger surgery (Right); iron infusion; PACEMAKER IMPLANT (N/A, 08/10/2020); Wisdom tooth extraction; Colonoscopy (2016); and Kyphoplasty (Bilateral, 10/10/2023). Family His family history includes Breast cancer in his mother; COPD in his maternal grandfather and mother; Colon cancer (age of onset: 51) in his maternal grandfather; Colon polyps in his brother and mother; Colon polyps (age of onset: 86) in his daughter; Colon polyps (age of onset: 79) in  his maternal grandfather; Heart failure in his mother; Hyperlipidemia in his brother and mother; Hypertension in his father; Rectal cancer in his mother; Stroke in his maternal grandfather. Social history  He reports that he quit smoking about 13 years ago. His smoking use included cigarettes. He has never used smokeless tobacco. He reports that he does not drink alcohol and does not use drugs.  Objective:   Today's Vitals   10/28/23 0953  Pulse: 63  Temp: 98.2 F (36.8 C)  SpO2: 99%  Weight: 165 lb (74.8 kg)  Height: 5\' 8"  (1.727 m)   Body mass index is 25.09 kg/m.  General appearance: alert, no distress, WD/WN, male HEENT: normocephalic, sclerae anicteric, TMs pearly, nares patent, no discharge or erythema, pharynx normal Oral cavity: MMM, no lesions Neck: supple, no lymphadenopathy, no thyromegaly, no masses Heart: RRR, normal S1, S2, no murmurs Lungs: CTA bilaterally, no wheezes, rhonchi, or rales Abdomen: +bs, soft, non tender, non distended, no masses, no hepatomegaly, no splenomegaly Musculoskeletal:  nontender, no swelling, no obvious deformity Extremities: no edema, no cyanosis, no clubbing Pulses: 2+ symmetric, upper and lower extremities, normal cap refill Neurological: alert, oriented x 3, CN2-12 intact, strength normal upper extremities and lower extremities, sensation normal throughout, DTRs 2+ throughout, no cerebellar signs, gait normal Psychiatric: normal affect, behavior normal, pleasant  Skin:  RLE with 3 in linear abrasion along mid shin.  Proximal portion of abrasion with approximately 1 cm round flat area of erythema.  Tender to palpation.  Mild edema from mid shin to ankle.    EKG:  Defer - patient has f/u with Cardiology 10/29/23  Adela Glimpse, NP   10/28/2023

## 2023-10-28 NOTE — Progress Notes (Unsigned)
Electrophysiology Office Note:   Date:  10/29/2023  ID:  Edwin Webster, DOB [birth year], MRN [id number]  Primary Cardiologist: None Electrophysiologist: Will Jorja Loa, MD       History of Present Illness:   Edwin Webster is a 62 y.o. male with h/o SSS s/p PPM, CAD, HTN, HLD, seronegative RA, IDA, Waldenstrom's macroglobulinemia seen today for routine electrophysiology followup.   Since last being seen in our clinic the patient reports he has been doing overall well. He recently fell and cut his right lower extremity.  The leg was very sore, raised and bruised prompting a visit with his PCP.  He was prescribed 7d of doxycycline.  No acute issues regarding pacemaker.   He denies chest pain, palpitations, dyspnea, PND, orthopnea, nausea, vomiting, dizziness, syncope, edema, weight gain, or early satiety.   Review of systems complete and found to be negative unless listed in HPI.   EP Information / Studies Reviewed:    EKG is not ordered today. EKG from 10/10/23 reviewed which showed AP 64 bpm      PPM Interrogation-  reviewed in detail today,  See PACEART report.  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 The patient's score is based upon: CHF History: 0 HTN History: 1 Diabetes History: 0 Stroke History: 0 Vascular Disease History: 0 Age Score: 0 Gender Score: 0     HYPERTENSION CONTROL Vitals:   10/29/23 1058 10/29/23 1134  BP: (!)  162/100 (!) 140/88    The patient's blood pressure is elevated above target today.  In order to address the patient's elevated BP: Blood pressure will be monitored at home to determine if medication changes need to be made.           Physical Exam:   VS:  BP (!) 140/88   Pulse 67   Ht 5\' 10"  (1.778 m)   Wt 164 lb (74.4 kg)   SpO2 98%   BMI 23.53 kg/m    Wt Readings from Last 3 Encounters:  10/29/23 164 lb (74.4 kg)  10/28/23 165 lb (74.8 kg)  10/10/23 155 lb (70.3 kg)     GEN: Well nourished, well developed in no acute distress NECK: No JVD; No carotid bruits CARDIAC: Regular rate and rhythm, no murmurs, rubs, gallops RESPIRATORY:  Clear to auscultation without rales, wheezing or rhonchi  ABDOMEN: Soft, non-tender, non-distended EXTREMITIES:  No edema; No deformity     ASSESSMENT AND PLAN:    SND s/p Abbott PPM  Hx of symptomatic bradycardia with HR 30-40's on monitor s/p PPM 2021 -Normal PPM function -See Pace Art report > Industry check today: presenting ASVS, Battery 7.8-8.1 years, normal impedance, sensing and threshold. No atrial arrhythmias / mode switches, AP 68% of time, VP <1%.   -No changes today  Paroxysmal AF  Seen on device, CHA2DS2-VASc 1 -no need for OAC  -asymptomatic  -0% burden on device   RLE Wound -complete 7 days doxycycline as  prescribed by PCP  -reviewed if leg not healing, he develops fevers, chills, drainage from site to let office know immediately  Hypertension  -controlled on current regimen > pt reports it is always elevated at Clinic visits / not at home  HLD  -per primary Cardiology  Disposition:   Follow up with Dr. Elberta Fortis in 12 months  Signed, Canary Brim, MSN, APRN, NP-C, AGACNP-BC [facility] - Electrophysiology  10/29/2023, 11:40 AM

## 2023-10-29 ENCOUNTER — Encounter: Payer: Self-pay | Admitting: Pulmonary Disease

## 2023-10-29 ENCOUNTER — Ambulatory Visit: Payer: Medicare HMO | Attending: Pulmonary Disease | Admitting: Pulmonary Disease

## 2023-10-29 VITALS — BP 140/88 | HR 67 | Ht 70.0 in | Wt 164.0 lb

## 2023-10-29 DIAGNOSIS — I495 Sick sinus syndrome: Secondary | ICD-10-CM | POA: Diagnosis not present

## 2023-10-29 DIAGNOSIS — I10 Essential (primary) hypertension: Secondary | ICD-10-CM

## 2023-10-29 DIAGNOSIS — Z950 Presence of cardiac pacemaker: Secondary | ICD-10-CM | POA: Diagnosis not present

## 2023-10-29 DIAGNOSIS — I480 Paroxysmal atrial fibrillation: Secondary | ICD-10-CM | POA: Diagnosis not present

## 2023-10-29 LAB — CUP PACEART INCLINIC DEVICE CHECK
Date Time Interrogation Session: 20241203115300
Implantable Lead Connection Status: 753985
Implantable Lead Connection Status: 753985
Implantable Lead Implant Date: 20210915
Implantable Lead Implant Date: 20210915
Implantable Lead Location: 753859
Implantable Lead Location: 753860
Implantable Pulse Generator Implant Date: 20210915
Pulse Gen Model: 2272
Pulse Gen Serial Number: 3864545

## 2023-10-29 LAB — URINALYSIS, ROUTINE W REFLEX MICROSCOPIC
Bacteria, UA: NONE SEEN /[HPF]
Bilirubin Urine: NEGATIVE
Glucose, UA: NEGATIVE
Ketones, ur: NEGATIVE
Leukocytes,Ua: NEGATIVE
Nitrite: NEGATIVE
Protein, ur: NEGATIVE
RBC / HPF: NONE SEEN /[HPF] (ref 0–2)
Specific Gravity, Urine: 1.004 (ref 1.001–1.035)
Squamous Epithelial / HPF: NONE SEEN /[HPF] (ref <=5)
WBC, UA: NONE SEEN /[HPF] (ref 0–5)
pH: 6.5 (ref 5.0–8.0)

## 2023-10-29 LAB — HEMOGLOBIN A1C
Hgb A1c MFr Bld: 5.4 %{Hb} (ref <5.7)
Mean Plasma Glucose: 108 mg/dL
eAG (mmol/L): 6 mmol/L

## 2023-10-29 LAB — PSA: PSA: 0.58 ng/mL (ref <=4.00)

## 2023-10-29 LAB — MICROALBUMIN / CREATININE URINE RATIO
Creatinine, Urine: 30 mg/dL (ref 20–320)
Microalb, Ur: <0.2 mg/dL

## 2023-10-29 LAB — LIPID PANEL
Cholesterol: 242 mg/dL — ABNORMAL HIGH (ref <200)
HDL: 55 mg/dL (ref >=40)
LDL Cholesterol (Calc): 166 mg/dL — ABNORMAL HIGH
Non-HDL Cholesterol (Calc): 187 mg/dL — ABNORMAL HIGH (ref <130)
Total CHOL/HDL Ratio: 4.4 (calc) (ref <5.0)
Triglycerides: 97 mg/dL (ref <150)

## 2023-10-29 LAB — TSH: TSH: 1.6 m[IU]/L (ref 0.40–4.50)

## 2023-10-29 LAB — INSULIN, RANDOM: Insulin: 4.2 u[IU]/mL

## 2023-10-29 LAB — VITAMIN B12: Vitamin B-12: 471 pg/mL (ref 200–1100)

## 2023-10-29 LAB — MICROSCOPIC MESSAGE

## 2023-10-29 LAB — MAGNESIUM: Magnesium: 2.4 mg/dL (ref 1.5–2.5)

## 2023-10-29 LAB — VITAMIN D 25 HYDROXY (VIT D DEFICIENCY, FRACTURES): Vit D, 25-Hydroxy: 47 ng/mL (ref 30–100)

## 2023-10-29 NOTE — Patient Instructions (Signed)
Medication Instructions:  Your physician recommends that you continue on your current medications as directed. Please refer to the Current Medication list given to you today. *If you need a refill on your cardiac medications before your next appointment, please call your pharmacy*  Follow-Up: At [facility], you and your health needs are our priority.  As part of our continuing mission to provide you with exceptional heart care, we have created designated Provider Care Teams.  These Care Teams include your primary Cardiologist (physician) and Advanced Practice Providers (APPs -  Physician Assistants and Nurse Practitioners) who all work together to provide you with the care you need, when you need it.  We recommend signing up for the patient portal called "MyChart".  Sign up information is provided on this After Visit Summary.  MyChart is used to connect with patients for Virtual Visits (Telemedicine).  Patients are able to view lab/test results, encounter notes, upcoming appointments, etc.  Non-urgent messages can be sent to your provider as well.   To learn more about what you can do with MyChart, go to [email or web address].    Your next appointment:   1 year(s)  Provider:   Allegra Lai, MD

## 2023-10-29 NOTE — Progress Notes (Deleted)
Notes he gets very nervous at MD visits.

## 2023-11-07 ENCOUNTER — Ambulatory Visit (INDEPENDENT_AMBULATORY_CARE_PROVIDER_SITE_OTHER): Payer: Medicare HMO

## 2023-11-07 DIAGNOSIS — I495 Sick sinus syndrome: Secondary | ICD-10-CM | POA: Diagnosis not present

## 2023-11-07 LAB — CUP PACEART REMOTE DEVICE CHECK
Battery Remaining Longevity: 90 mo
Battery Remaining Percentage: 74 %
Battery Voltage: 2.99 V
Brady Statistic AP VP Percent: 1 %
Brady Statistic AP VS Percent: 85 %
Brady Statistic AS VP Percent: 1 %
Brady Statistic AS VS Percent: 15 %
Brady Statistic RA Percent Paced: 85 %
Brady Statistic RV Percent Paced: 1 %
Date Time Interrogation Session: 20241212020013
Implantable Lead Connection Status: 753985
Implantable Lead Connection Status: 753985
Implantable Lead Implant Date: 20210915
Implantable Lead Implant Date: 20210915
Implantable Lead Location: 753859
Implantable Lead Location: 753860
Implantable Pulse Generator Implant Date: 20210915
Lead Channel Impedance Value: 400 Ohm
Lead Channel Impedance Value: 580 Ohm
Lead Channel Pacing Threshold Amplitude: 0.5 V
Lead Channel Pacing Threshold Amplitude: 0.625 V
Lead Channel Pacing Threshold Pulse Width: 0.5 ms
Lead Channel Pacing Threshold Pulse Width: 0.5 ms
Lead Channel Sensing Intrinsic Amplitude: 1.6 mV
Lead Channel Sensing Intrinsic Amplitude: 8.4 mV
Lead Channel Setting Pacing Amplitude: 0.875
Lead Channel Setting Pacing Amplitude: 1.5 V
Lead Channel Setting Pacing Pulse Width: 0.5 ms
Lead Channel Setting Sensing Sensitivity: 2 mV
Pulse Gen Model: 2272
Pulse Gen Serial Number: 3864545

## 2023-11-26 ENCOUNTER — Other Ambulatory Visit: Payer: Self-pay | Admitting: Internal Medicine

## 2023-11-27 ENCOUNTER — Other Ambulatory Visit: Payer: Self-pay | Admitting: Nurse Practitioner

## 2023-11-27 DIAGNOSIS — J302 Other seasonal allergic rhinitis: Secondary | ICD-10-CM

## 2023-12-01 ENCOUNTER — Other Ambulatory Visit: Payer: Self-pay | Admitting: Cardiology

## 2023-12-04 ENCOUNTER — Other Ambulatory Visit: Payer: Self-pay

## 2023-12-04 ENCOUNTER — Other Ambulatory Visit: Payer: Self-pay | Admitting: *Deleted

## 2023-12-04 ENCOUNTER — Telehealth: Payer: Self-pay

## 2023-12-04 ENCOUNTER — Other Ambulatory Visit (HOSPITAL_COMMUNITY): Payer: Self-pay

## 2023-12-04 MED ORDER — IBRUTINIB 280 MG PO TABS
280.0000 mg | ORAL_TABLET | Freq: Every day | ORAL | 11 refills | Status: DC
  Filled 2023-12-04: qty 28, 28d supply, fill #0
  Filled 2023-12-24: qty 28, 28d supply, fill #1
  Filled 2024-01-14: qty 28, 28d supply, fill #2
  Filled 2024-02-10: qty 28, 28d supply, fill #3
  Filled 2024-02-27: qty 28, 28d supply, fill #4
  Filled 2024-04-06: qty 28, 28d supply, fill #5
  Filled 2024-05-04: qty 28, 28d supply, fill #6
  Filled 2024-05-28: qty 28, 28d supply, fill #7
  Filled 2024-06-24 – 2024-07-30 (×2): qty 28, 28d supply, fill #8
  Filled 2024-08-24: qty 28, 28d supply, fill #9
  Filled 2024-09-15 – 2024-09-17 (×2): qty 28, 28d supply, fill #10
  Filled 2024-10-08: qty 28, 28d supply, fill #11

## 2023-12-04 NOTE — Telephone Encounter (Signed)
 Oral Oncology Patient Advocate Encounter  Prior Authorization for Imbruvica  has been approved.    PA# [id number]  Effective dates: 11/27/23 through 11/25/24  Patients co-pay is $200.00.   Obtained PANF Grant to make co-pay $0.00   Morene Potters, CPhT Oncology Pharmacy Patient Advocate  [facility]  [phone] (phone) [phone] (fax) 12/04/2023 10:06 AM

## 2023-12-04 NOTE — Progress Notes (Signed)
 Specialty Pharmacy Initial Fill Coordination Note  Edwin Webster is a 63 y.o. male contacted today regarding initial fill of specialty medication(s) Ibrutinib  (IMBRUVICA )  Patient requested Delivery   Delivery date: 12/06/23   Verified address: [address] [place], [place] [address]  Medication will be filled on 12/05/23.   Patient is aware of $0.00 copayment. Bill PANF Secondary.    Morene Potters, CPhT Oncology Pharmacy Patient Advocate  [facility]  [phone] (phone) [phone] (fax) 12/04/2023 12:45 PM

## 2023-12-04 NOTE — Telephone Encounter (Signed)
 Oral Oncology Patient Advocate Encounter  New authorization   Received notification that prior authorization for Imbruvica  is due for renewal.   PA submitted on 12/04/23  Key [id number]  Status is pending     Morene Potters, CPhT Oncology Pharmacy Patient Advocate  [facility]  [phone] (phone) [phone] (fax) 12/04/2023 9:57 AM

## 2023-12-04 NOTE — Progress Notes (Signed)
 Oral Chemotherapy Pharmacist Encounter  Patient was counseled under telephone encounter from 06/26/2018.  Asberry Macintosh, PharmD, BCPS, BCOP Hematology/Oncology Clinical Pharmacist [facility] Oral Chemotherapy Navigation Clinics [phone] 12/04/2023 12:48 PM

## 2023-12-05 ENCOUNTER — Other Ambulatory Visit: Payer: Self-pay

## 2023-12-12 NOTE — Progress Notes (Signed)
Remote pacemaker transmission.   

## 2023-12-21 ENCOUNTER — Other Ambulatory Visit: Payer: Self-pay | Admitting: Hematology & Oncology

## 2023-12-23 ENCOUNTER — Encounter: Payer: Self-pay | Admitting: Family

## 2023-12-24 ENCOUNTER — Inpatient Hospital Stay: Payer: Medicare HMO | Attending: Hematology & Oncology

## 2023-12-24 ENCOUNTER — Other Ambulatory Visit: Payer: Self-pay

## 2023-12-24 ENCOUNTER — Inpatient Hospital Stay (HOSPITAL_BASED_OUTPATIENT_CLINIC_OR_DEPARTMENT_OTHER): Payer: Medicare HMO | Admitting: Hematology & Oncology

## 2023-12-24 ENCOUNTER — Encounter: Payer: Self-pay | Admitting: Hematology & Oncology

## 2023-12-24 VITALS — BP 189/97 | HR 62 | Temp 97.6°F | Resp 18 | Ht 69.0 in | Wt 164.0 lb

## 2023-12-24 DIAGNOSIS — D509 Iron deficiency anemia, unspecified: Secondary | ICD-10-CM | POA: Diagnosis not present

## 2023-12-24 DIAGNOSIS — M4854XA Collapsed vertebra, not elsewhere classified, thoracic region, initial encounter for fracture: Secondary | ICD-10-CM | POA: Insufficient documentation

## 2023-12-24 DIAGNOSIS — C8800 Waldenstrom macroglobulinemia not having achieved remission: Secondary | ICD-10-CM

## 2023-12-24 DIAGNOSIS — M8088XS Other osteoporosis with current pathological fracture, vertebra(e), sequela: Secondary | ICD-10-CM | POA: Insufficient documentation

## 2023-12-24 DIAGNOSIS — D500 Iron deficiency anemia secondary to blood loss (chronic): Secondary | ICD-10-CM

## 2023-12-24 LAB — CMP (CANCER CENTER ONLY)
ALT: 7 U/L (ref 0–44)
AST: 15 U/L (ref 15–41)
Albumin: 4.7 g/dL (ref 3.5–5.0)
Alkaline Phosphatase: 70 U/L (ref 38–126)
Anion gap: 8 (ref 5–15)
BUN: 14 mg/dL (ref 8–23)
CO2: 27 mmol/L (ref 22–32)
Calcium: 9.4 mg/dL (ref 8.9–10.3)
Chloride: 103 mmol/L (ref 98–111)
Creatinine: 1.25 mg/dL — ABNORMAL HIGH (ref 0.61–1.24)
GFR, Estimated: >60 mL/min (ref >60)
Glucose, Bld: 87 mg/dL (ref 70–99)
Potassium: 4.1 mmol/L (ref 3.5–5.1)
Sodium: 138 mmol/L (ref 135–145)
Total Bilirubin: 0.8 mg/dL (ref 0.0–1.2)
Total Protein: 6.8 g/dL (ref 6.5–8.1)

## 2023-12-24 LAB — CBC WITH DIFFERENTIAL (CANCER CENTER ONLY)
Abs Immature Granulocytes: 0.01 10*3/uL (ref 0.00–0.07)
Basophils Absolute: 0 10*3/uL (ref 0.0–0.1)
Basophils Relative: 1 %
Eosinophils Absolute: 0.1 10*3/uL (ref 0.0–0.5)
Eosinophils Relative: 1 %
HCT: 41.9 % (ref 39.0–52.0)
Hemoglobin: 14.1 g/dL (ref 13.0–17.0)
Immature Granulocytes: 0 %
Lymphocytes Relative: 26 %
Lymphs Abs: 1.1 10*3/uL (ref 0.7–4.0)
MCH: 32 pg (ref 26.0–34.0)
MCHC: 33.7 g/dL (ref 30.0–36.0)
MCV: 95.2 fL (ref 80.0–100.0)
Monocytes Absolute: 0.5 10*3/uL (ref 0.1–1.0)
Monocytes Relative: 12 %
Neutro Abs: 2.6 10*3/uL (ref 1.7–7.7)
Neutrophils Relative %: 60 %
Platelet Count: 254 10*3/uL (ref 150–400)
RBC: 4.4 MIL/uL (ref 4.22–5.81)
RDW: 13.2 % (ref 11.5–15.5)
WBC Count: 4.4 10*3/uL (ref 4.0–10.5)
nRBC: 0 % (ref 0.0–0.2)

## 2023-12-24 LAB — LACTATE DEHYDROGENASE: LDH: 168 U/L (ref 98–192)

## 2023-12-24 LAB — IRON AND IRON BINDING CAPACITY (CC-WL,HP ONLY)
Iron: 134 ug/dL (ref 45–182)
Saturation Ratios: 31 % (ref 17.9–39.5)
TIBC: 427 ug/dL (ref 250–450)
UIBC: 293 ug/dL (ref 117–376)

## 2023-12-24 LAB — FERRITIN: Ferritin: 20 ng/mL — ABNORMAL LOW (ref 24–336)

## 2023-12-24 NOTE — Progress Notes (Signed)
Specialty Pharmacy Refill Coordination Note  Edwin Webster is a 63 y.o. male contacted today regarding refills of specialty medication(s) Ibrutinib [name]   Patient requested Delivery   Delivery date: 12/27/23   Verified address: [address]   [place] [address]   Medication will be filled on 12/26/23.

## 2023-12-24 NOTE — Progress Notes (Signed)
Hematology and Oncology Follow Up Visit  Edwin Webster [id number] November 20, 1961 63 y.o. 12/24/2023   Principle Diagnosis:  Waldenstrom's macroglobulinemia Iron deficiency anemia  Current Therapy:   Rituxan/bendamustine-s/p cycle 2 -- d/c on 06/11/2018 Imbruvica  280 mg po q day -- changed on 08/06/2019 IV iron as indicated - last received on 08/16/2022      Interim History:  Edwin Webster is here today for follow-up.  I think he had a vertebroplasty at T8.  When we last saw him, he was having a lot of back issues.  We subsequently got an MRI.  Again this showed the compression fracture at T8.  He saw Neurosurgery.  They went ahead and did a vertebroplasty in November.  He felt somewhat better immediately.  Again, I do not think this was anything related to his Waldenstrom's or to the [place] that he takes.  When we last saw him, there was a monoclonal spike of 0.4 g/dL.  His IgM level was 520 mg/dL.  He had no problems over the Holiday season.  He was with his family.  He has had no fever.  He has had no cough.  He has had no leg swelling.  He has had no problems with the pacemaker..  The 1 probably has had though his eyes had some dizziness.  He says he is been "off" on occasion.  He says that when he walks he just starts veering to the right.  There is no vertigo.  He has had no double vision.  There is no weakness.  I am not sure exactly what is causing this.  I do not know if this is cardiac.  I do think that is very needed to have an MRI of the brain to make sure nothing is going on of there..  This certainly is not from hyperviscosity given his low level of monoclonal protein.  His iron studies today show a iron saturation of 31%.  Currently, I would have said that his performance status is ECOG 1.   Medications:  Allergies as of 12/24/2023       Reactions   Adalimumab Other (See Comments)   "blacked out", confused        Medication List        Accurate as of December 24, 2023 11:21 AM. If you have any questions, ask your nurse or doctor.          acetaminophen 650 MG CR tablet Commonly known as: TYLENOL Take 650 mg by mouth every 8 (eight) hours as needed for pain.   acyclovir 200 MG capsule Commonly known as: ZOVIRAX TAKE 1 CAPSULE BY MOUTH TWICE A DAY   alendronate 70 MG tablet Commonly known as: FOSAMAX Take 1 tablet (70 mg total) by mouth every 7 (seven) days. Take with a full glass of water on an empty stomach and avoid reclining or lying down for 2 hours after taking this medication.   baclofen 10 MG tablet Commonly known as: LIORESAL TAKE 1 /2 TO 1 TABLET 2 X /DAY IF NEEDED FOR MUSCLE SPASM   betamethasone dipropionate 0.05 % ointment Commonly known as: DIPROLENE Apply topically 2 (two) times daily. What changed:  how much to take when to take this reasons to take this   bisoprolol 10 MG tablet Commonly known as: ZEBETA Take   1 tablet  every Morning  for BP   busPIRone 7.5 MG tablet Commonly known as: BUSPAR TAKE 1 TABLET 3 X /DAY FOR CHRONIC ANXIETY What  changed: See the new instructions.   escitalopram 20 MG tablet Commonly known as: LEXAPRO Take 20 mg by mouth daily.   ezetimibe 10 MG tablet Commonly known as: ZETIA TAKE 1 TABLET (10 MG TOTAL) BY MOUTH DAILY. PLEASE KEEP SCHEDULED APPOINTMENT   folic acid 1 MG tablet Commonly known as: FOLVITE TAKE 1 TABLET DAILY FOR FOLATE DEFICIENCY   ibuprofen 800 MG tablet Commonly known as: ADVIL Take 1 tablet (800 mg total) by mouth every 8 (eight) hours as needed.   Imbruvica 280 MG tablet Generic drug: ibrutinib Take 1 tablet (280 mg total) by mouth daily. Take with a glass of water.   ipratropium 0.06 % nasal spray Commonly known as: ATROVENT Use 1 to 2 sprays each nostril 2 to 3 x /day as needed   levocetirizine 5 MG tablet Commonly known as: XYZAL TAKE 1 TABLET BY MOUTH DAILY FOR ALLERGIES   olmesartan 40 MG tablet Commonly known as: BENICAR Take 1 tablet  (40 mg total) by mouth at bedtime. For BP   rosuvastatin 40 MG tablet Commonly known as: CRESTOR TAKE 1 TABLET BY MOUTH EVERY DAY FOR CHOLESTEROL   triamcinolone cream 0.1 % Commonly known as: KENALOG Apply  to skin  Rash   2 x /day   as needed   Vitamin D-3 125 MCG (5000 UT) Tabs Take 5,000 Units by mouth daily.        Allergies:  Allergies  Allergen Reactions   Adalimumab Other (See Comments)    "blacked out", confused     Past Medical History, Surgical history, Social history, and Family History were reviewed and updated.   His vital signs show a temperature of 97.9.  Pulse 62.  Blood pressure 152/75.  Weight is 165 pounds.  Review of Systems: Review of Systems  Constitutional: Negative.   HENT: Negative.    Eyes: Negative.   Respiratory: Negative.    Cardiovascular: Negative.   Gastrointestinal:  Negative for blood in stool.  Genitourinary: Negative.   Musculoskeletal:  Positive for back pain.  Skin: Negative.   Neurological: Negative.   Endo/Heme/Allergies: Negative.   Psychiatric/Behavioral: Negative.        Physical Exam:   Wt Readings from Last 3 Encounters:  10/29/23 164 lb (74.4 kg)  10/28/23 165 lb (74.8 kg)  10/10/23 155 lb (70.3 kg)    Physical Exam Vitals reviewed.  HENT:     Head: Normocephalic and atraumatic.  Eyes:     Pupils: Pupils are equal, round, and reactive to light.  Cardiovascular:     Rate and Rhythm: Normal rate and regular rhythm.     Heart sounds: Normal heart sounds.  Pulmonary:     Effort: Pulmonary effort is normal.     Breath sounds: Normal breath sounds.  Abdominal:     General: Bowel sounds are normal.     Palpations: Abdomen is soft.  Musculoskeletal:        General: No tenderness or deformity. Normal range of motion.     Cervical back: Normal range of motion.  Lymphadenopathy:     Cervical: No cervical adenopathy.  Skin:    General: Skin is warm and dry.     Findings: No erythema or rash.   Neurological:     Mental Status: He is alert and oriented to person, place, and time.  Psychiatric:        Behavior: Behavior normal.        Thought Content: Thought content normal.  Judgment: Judgment normal.     Lab Results  Component Value Date   WBC 4.4 12/24/2023   HGB 14.1 12/24/2023   HCT 41.9 12/24/2023   MCV 95.2 12/24/2023   PLT 254 12/24/2023   Lab Results  Component Value Date   FERRITIN 23 (L) 09/23/2023   IRON 141 09/23/2023   TIBC 416 09/23/2023   UIBC 275 09/23/2023   IRONPCTSAT 34 09/23/2023   Lab Results  Component Value Date   RETICCTPCT 1.2 01/11/2022   RBC 4.40 12/24/2023   RETICCTABS 33,120 02/12/2018   Lab Results  Component Value Date   KPAFRELGTCHN 20.7 (H) 09/23/2023   LAMBDASER 11.4 09/23/2023   KAPLAMBRATIO 1.82 (H) 09/23/2023   Lab Results  Component Value Date   IGGSERUM 639 09/23/2023   IGA 59 (L) 09/23/2023   IGMSERUM 547 (H) 09/23/2023   Lab Results  Component Value Date   TOTALPROTELP 6.8 09/23/2023   ALBUMINELP 3.9 09/23/2023   A1GS 0.3 09/23/2023   A2GS 0.7 09/23/2023   BETS 1.0 09/23/2023   BETA2SER 0.2 02/18/2018   GAMS 0.9 09/23/2023   MSPIKE 0.4 (H) 09/23/2023   SPEI Comment 01/11/2022     Chemistry      Component Value Date/Time   NA 138 12/24/2023 0950   NA 141 08/04/2020 1203   K 4.1 12/24/2023 0950   CL 103 12/24/2023 0950   CO2 27 12/24/2023 0950   BUN 14 12/24/2023 0950   BUN 13 08/04/2020 1203   CREATININE 1.25 (H) 12/24/2023 0950   CREATININE 1.19 04/04/2023 1042      Component Value Date/Time   CALCIUM 9.4 12/24/2023 0950   ALKPHOS 70 12/24/2023 0950   AST 15 12/24/2023 0950   ALT 7 12/24/2023 0950   BILITOT 0.8 12/24/2023 0950      Impression and Plan: Mr. Cutting is a very pleasant 63 yo caucasian gentleman with Waldenstrom's macroglobulinemia.  He is on single agent Imbruvica.  He is doing well on this from my point of view.  We will check his monoclonal studies and see how they  look.  We will see what the MRI has to show.  But will try to get this set up for next week.  We will then have to see about the possibility of referral to Neurology.  We will see what his iron levels look like.  We will plan to get him back to see [place] in another 6 weeks or so.  [name], MD 1/28/202511:21 AM

## 2023-12-25 LAB — KAPPA/LAMBDA LIGHT CHAINS
Kappa free light chain: 20.4 mg/L — ABNORMAL HIGH (ref 3.3–19.4)
Kappa, lambda light chain ratio: 1.84 — ABNORMAL HIGH (ref 0.26–1.65)
Lambda free light chains: 11.1 mg/L (ref 5.7–26.3)

## 2023-12-25 LAB — IGG, IGA, IGM
IgA: 49 mg/dL — ABNORMAL LOW (ref 61–437)
IgG (Immunoglobin G), Serum: 581 mg/dL — ABNORMAL LOW (ref 603–1613)
IgM (Immunoglobulin M), Srm: 437 mg/dL — ABNORMAL HIGH (ref 20–172)

## 2023-12-26 ENCOUNTER — Other Ambulatory Visit: Payer: Self-pay

## 2023-12-27 ENCOUNTER — Other Ambulatory Visit: Payer: Self-pay

## 2023-12-27 NOTE — Progress Notes (Signed)
Specialty Pharmacy Ongoing Clinical Assessment Note  Edwin Webster is a 63 y.o. male who is being followed by the specialty pharmacy service for RxSp Oncology   Patient's specialty medication(s) reviewed today: Ibrutinib (IMBRUVICA)  Patient has been on Ibruvica. Patient is new to pharmacy.   Missed doses in the last 4 weeks: 0   Patient/Caregiver did not have any additional questions or concerns.   Therapeutic benefit summary: Unable to assess   Adverse events/side effects summary: No adverse events/side effects   Patient's therapy is appropriate to: Continue    Goals Addressed             This Visit's Progress    Stabilization of disease       Patient is on track. Patient will maintain adherence         Follow up:  3 months  Bobette Mo Specialty Pharmacist

## 2024-01-02 ENCOUNTER — Telehealth: Payer: Self-pay

## 2024-01-02 LAB — PROTEIN ELECTROPHORESIS, SERUM, WITH REFLEX
A/G Ratio: 1.3 (ref 0.7–1.7)
Albumin ELP: 3.9 g/dL (ref 2.9–4.4)
Alpha-1-Globulin: 0.3 g/dL (ref 0.0–0.4)
Alpha-2-Globulin: 0.7 g/dL (ref 0.4–1.0)
Beta Globulin: 1 g/dL (ref 0.7–1.3)
Gamma Globulin: 0.9 g/dL (ref 0.4–1.8)
Globulin, Total: 2.9 g/dL (ref 2.2–3.9)
M-Spike, %: 0.4 g/dL — ABNORMAL HIGH
SPEP Interpretation: 0
Total Protein ELP: 6.8 g/dL (ref 6.0–8.5)

## 2024-01-02 LAB — IMMUNOFIXATION REFLEX, SERUM
IgA: 57 mg/dL — ABNORMAL LOW (ref 61–437)
IgG (Immunoglobin G), Serum: 647 mg/dL (ref 603–1613)
IgM (Immunoglobulin M), Srm: 507 mg/dL — ABNORMAL HIGH (ref 20–172)

## 2024-01-02 NOTE — Telephone Encounter (Signed)
 Advised via MyChart.

## 2024-01-02 NOTE — Telephone Encounter (Signed)
-----   Message from Ivor Mars sent at 01/02/2024  5:53 AM EST ----- Call - th Waldenstrom's levels are still low and stable!!   Twilla Galea

## 2024-01-14 ENCOUNTER — Other Ambulatory Visit: Payer: Self-pay

## 2024-01-14 ENCOUNTER — Other Ambulatory Visit: Payer: Self-pay | Admitting: Pharmacy Technician

## 2024-01-14 NOTE — Progress Notes (Signed)
 Specialty Pharmacy Refill Coordination Note  Edwin Webster is a 63 y.o. male contacted today regarding refills of specialty medication(s) Ibrutinib [name]   Patient requested Delivery   Delivery date: 01/21/24   Verified address: [address]  [place]   Medication will be filled on 01/20/24.

## 2024-01-24 ENCOUNTER — Ambulatory Visit (HOSPITAL_COMMUNITY)
Admission: RE | Admit: 2024-01-24 | Discharge: 2024-01-24 | Disposition: A | Discharge status: Home / Self Care | Payer: Medicare HMO | Source: Ambulatory Visit | Attending: Hematology & Oncology | Admitting: Hematology & Oncology

## 2024-01-24 DIAGNOSIS — C8800 Waldenstrom macroglobulinemia not having achieved remission: Secondary | ICD-10-CM | POA: Insufficient documentation

## 2024-01-24 MED ORDER — GADOBUTROL 1 MMOL/ML IV SOLN
7.5000 mL | Freq: Once | INTRAVENOUS | Status: AC | PRN
  Administered 2024-01-24: 7.5 mL via INTRAVENOUS

## 2024-01-31 ENCOUNTER — Ambulatory Visit: Payer: Medicare HMO | Admitting: Nurse Practitioner

## 2024-02-06 ENCOUNTER — Ambulatory Visit (INDEPENDENT_AMBULATORY_CARE_PROVIDER_SITE_OTHER): Payer: Medicare Other

## 2024-02-06 DIAGNOSIS — I495 Sick sinus syndrome: Secondary | ICD-10-CM | POA: Diagnosis not present

## 2024-02-06 LAB — CUP PACEART REMOTE DEVICE CHECK
Battery Remaining Longevity: 86 mo
Battery Remaining Percentage: 71 %
Battery Voltage: 2.99 V
Brady Statistic AP VP Percent: 1 %
Brady Statistic AP VS Percent: 81 %
Brady Statistic AS VP Percent: 1 %
Brady Statistic AS VS Percent: 19 %
Brady Statistic RA Percent Paced: 81 %
Brady Statistic RV Percent Paced: 1 %
Date Time Interrogation Session: 20250313020037
Implantable Lead Connection Status: 753985
Implantable Lead Connection Status: 753985
Implantable Lead Implant Date: 20210915
Implantable Lead Implant Date: 20210915
Implantable Lead Location: 753859
Implantable Lead Location: 753860
Implantable Pulse Generator Implant Date: 20210915
Lead Channel Impedance Value: 410 Ohm
Lead Channel Impedance Value: 530 Ohm
Lead Channel Pacing Threshold Amplitude: 0.5 V
Lead Channel Pacing Threshold Amplitude: 0.75 V
Lead Channel Pacing Threshold Pulse Width: 0.5 ms
Lead Channel Pacing Threshold Pulse Width: 0.5 ms
Lead Channel Sensing Intrinsic Amplitude: 2.4 mV
Lead Channel Sensing Intrinsic Amplitude: 9.1 mV
Lead Channel Setting Pacing Amplitude: 1 V
Lead Channel Setting Pacing Amplitude: 1.5 V
Lead Channel Setting Pacing Pulse Width: 0.5 ms
Lead Channel Setting Sensing Sensitivity: 2 mV
Pulse Gen Model: 2272
Pulse Gen Serial Number: 3864545

## 2024-02-10 ENCOUNTER — Other Ambulatory Visit: Payer: Self-pay

## 2024-02-10 ENCOUNTER — Encounter

## 2024-02-10 NOTE — Progress Notes (Signed)
 Specialty Pharmacy Refill Coordination Note  COWEN PESQUEIRA is a 63 y.o. male contacted today regarding refills of specialty medication(s) Ibrutinib (IMBRUVICA)   Patient requested Delivery   Delivery date: 02/11/24 (sent mychart message to inform of updated delivery date)   Verified address: [address] [id number]   Medication will be filled on 02/10/24.

## 2024-02-14 ENCOUNTER — Telehealth: Payer: Self-pay

## 2024-02-14 ENCOUNTER — Telehealth: Payer: Self-pay | Admitting: *Deleted

## 2024-02-14 NOTE — Telephone Encounter (Signed)
 Pt returned my call.  Scheduled to see afib clinic Monday to discuss further.  Pt agreeable to plan.

## 2024-02-14 NOTE — Telephone Encounter (Signed)
-----   Message from [name] sent at 02/14/2024 10:11 AM EDT ----- Please call and let him know the MRI of the brain looks okay.  Nothing looks suspicious.Edwin Webster

## 2024-02-14 NOTE — Telephone Encounter (Signed)
-----   Message from [facility] sent at 02/10/2024  5:24 PM EDT ----- Remote pacemaker interrogation. Presenting Rhythm:A-sensed V-sensed. Battery and lead parameters stable with stable capture and sensing. Device programming is appropriate. Continue remote monitoring.  In AF, needs AF clinic follow up

## 2024-02-14 NOTE — Telephone Encounter (Signed)
 Called and informed patient of MRI results, pt appreciative of call and denies any other concerns or questions at this time.

## 2024-02-14 NOTE — Telephone Encounter (Signed)
 Left message to call back

## 2024-02-17 ENCOUNTER — Ambulatory Visit (HOSPITAL_COMMUNITY)
Admission: RE | Admit: 2024-02-17 | Discharge: 2024-02-17 | Disposition: A | Discharge status: Home / Self Care | Source: Ambulatory Visit | Attending: Internal Medicine | Admitting: Internal Medicine

## 2024-02-17 ENCOUNTER — Telehealth: Payer: Self-pay

## 2024-02-17 VITALS — BP 142/88 | HR 65 | Ht 69.0 in | Wt 162.6 lb

## 2024-02-17 DIAGNOSIS — M0600 Rheumatoid arthritis without rheumatoid factor, unspecified site: Secondary | ICD-10-CM | POA: Insufficient documentation

## 2024-02-17 DIAGNOSIS — I10 Essential (primary) hypertension: Secondary | ICD-10-CM | POA: Diagnosis not present

## 2024-02-17 DIAGNOSIS — I4891 Unspecified atrial fibrillation: Secondary | ICD-10-CM | POA: Diagnosis present

## 2024-02-17 DIAGNOSIS — Z7983 Long term (current) use of bisphosphonates: Secondary | ICD-10-CM | POA: Diagnosis not present

## 2024-02-17 DIAGNOSIS — D509 Iron deficiency anemia, unspecified: Secondary | ICD-10-CM | POA: Insufficient documentation

## 2024-02-17 DIAGNOSIS — E785 Hyperlipidemia, unspecified: Secondary | ICD-10-CM | POA: Diagnosis not present

## 2024-02-17 DIAGNOSIS — I495 Sick sinus syndrome: Secondary | ICD-10-CM | POA: Diagnosis not present

## 2024-02-17 DIAGNOSIS — I480 Paroxysmal atrial fibrillation: Secondary | ICD-10-CM | POA: Diagnosis not present

## 2024-02-17 DIAGNOSIS — I2510 Atherosclerotic heart disease of native coronary artery without angina pectoris: Secondary | ICD-10-CM | POA: Insufficient documentation

## 2024-02-17 DIAGNOSIS — Z950 Presence of cardiac pacemaker: Secondary | ICD-10-CM | POA: Insufficient documentation

## 2024-02-17 DIAGNOSIS — C8800 Waldenstrom macroglobulinemia not having achieved remission: Secondary | ICD-10-CM | POA: Diagnosis not present

## 2024-02-17 DIAGNOSIS — D6869 Other thrombophilia: Secondary | ICD-10-CM

## 2024-02-17 NOTE — Telephone Encounter (Signed)
 Transmission received 02/17/2024.

## 2024-02-17 NOTE — Progress Notes (Addendum)
 Primary Care Physician: Vangie Genet, MD Primary Cardiologist: None Electrophysiologist: Will Cortland Ding, MD     Referring Physician: Dr. Sung Engels is a 63 y.o. male with a history of SSS s/p PPM, CAD, HTN, HLD, seronegative RA, IDA, Waldenstrom's macroglobulinemia, and atrial fibrillation who presents for consultation in the [facility]. Devic interrogation on 3/17 showed patient was in active Afib. Patient is not on OAC. He has a CHADS2VASC score of 3.  On evaluation today, he is currently in A paced rhythm. He manually sent an interrogation prior to arrival which showed an episode of Afib on 3/19 which lasted for 1 hour and 57 minutes. He does admit he is scared of stroke due to several family members having had stroke. He is interested in [facility] but would like to speak with oncologist first before proceeding. Wife notes he is intermittently lightheaded but this is not isolated to 3/19; it has been going on for a while.  Today, he denies symptoms of palpitations, chest pain, shortness of breath, orthopnea, PND, lower extremity edema, dizziness, presyncope, syncope, snoring, daytime somnolence, bleeding, or neurologic sequela. The patient is tolerating medications without difficulties and is otherwise without complaint today.    he has a BMI of Body mass index is 24.01 kg/m.Aaron Aas Filed Weights   02/17/24 1337  Weight: 73.8 kg    Current Outpatient Medications  Medication Sig Dispense Refill   acetaminophen  (TYLENOL ) 650 MG CR tablet Take 650 mg by mouth as needed for pain.     acyclovir  (ZOVIRAX ) 200 MG capsule TAKE 1 CAPSULE BY MOUTH TWICE A DAY 180 capsule 1   alendronate  (FOSAMAX ) 70 MG tablet Take 1 tablet (70 mg total) by mouth every 7 (seven) days. Take with a full glass of water on an empty stomach and avoid reclining or lying down for 2 hours after taking this medication. 12 tablet 3   baclofen  (LIORESAL ) 10 MG tablet TAKE 1 /2  TO 1 TABLET 2 X /DAY IF NEEDED FOR MUSCLE SPASM (Patient taking differently: Only takes as needed) 100 tablet 1   betamethasone  dipropionate (DIPROLENE ) 0.05 % ointment Apply topically 2 (two) times daily. (Patient taking differently: Apply 1 application  topically daily as needed (Itching).) 30 g 0   bisoprolol  (ZEBETA ) 10 MG tablet Take   1 tablet  every Morning  for BP 90 tablet 3   busPIRone  (BUSPAR ) 7.5 MG tablet TAKE 1 TABLET 3 X /DAY FOR CHRONIC ANXIETY (Patient taking differently: as needed.) 270 tablet 3   Cholecalciferol (VITAMIN D -3) 5000 UNITS TABS Take 5,000 Units by mouth daily.     escitalopram  (LEXAPRO ) 20 MG tablet Take 20 mg by mouth daily.     ezetimibe  (ZETIA ) 10 MG tablet TAKE 1 TABLET (10 MG TOTAL) BY MOUTH DAILY. PLEASE KEEP SCHEDULED APPOINTMENT 90 tablet 3   folic acid  (FOLVITE ) 1 MG tablet TAKE 1 TABLET DAILY FOR FOLATE DEFICIENCY 90 tablet 3   ibrutinib  (IMBRUVICA ) 280 MG tablet Take 1 tablet (280 mg total) by mouth daily. Take with a glass of water. 28 tablet 11   ibuprofen  (ADVIL ) 800 MG tablet Take 1 tablet (800 mg total) by mouth every 8 (eight) hours as needed. (Patient taking differently: Take 800 mg by mouth as needed.) 30 tablet 0   ipratropium (ATROVENT ) 0.06 % nasal spray Use 1 to 2 sprays each nostril 2 to 3 x /day as needed 45 mL 3   levocetirizine (XYZAL ) 5 MG  tablet TAKE 1 TABLET BY MOUTH DAILY FOR ALLERGIES 90 tablet 3   olmesartan  (BENICAR ) 40 MG tablet Take 1 tablet (40 mg total) by mouth at bedtime. For BP 90 tablet 3   rosuvastatin  (CRESTOR ) 40 MG tablet TAKE 1 TABLET BY MOUTH EVERY DAY FOR CHOLESTEROL 90 tablet 3   triamcinolone  cream (KENALOG ) 0.1 % Apply  to skin  Rash   2 x /day   as needed 80 g 12   No current facility-administered medications for this encounter.    Atrial Fibrillation Management history:  Previous antiarrhythmic drugs: none Previous cardioversions: none Previous ablations: none Anticoagulation history: none   ROS- All  systems are reviewed and negative except as per the HPI above.  Physical Exam: BP (!) 142/88   Pulse 65   Ht 5\' 9"  (1.753 m)   Wt 73.8 kg   BMI 24.01 kg/m   GEN: Well nourished, well developed in no acute distress NECK: No JVD; No carotid bruits CARDIAC: Regular rate and rhythm, no murmurs, rubs, gallops RESPIRATORY:  Clear to auscultation without rales, wheezing or rhonchi  ABDOMEN: Soft, non-tender, non-distended EXTREMITIES:  No edema; No deformity   EKG today demonstrates  Vent. rate 65 BPM PR interval 206 ms QRS duration 88 ms QT/QTcB 418/434 ms P-R-T axes 28 92 52 Atrial-paced rhythm with occasional Premature ventricular complexes Rightward axis Abnormal ECG When compared with ECG of 10-Oct-2023 08:12, PREVIOUS ECG IS PRESENT  Echo 07/27/20 demonstrated  1. Left ventricular ejection fraction, by estimation, is 55 to 60%. The  left ventricle has normal function. The left ventricle has no regional  wall motion abnormalities. Left ventricular diastolic parameters were  normal.   2. Right ventricular systolic function is normal. The right ventricular  size is normal.   3. The mitral valve is normal in structure. No evidence of mitral valve  regurgitation. No evidence of mitral stenosis.   4. The aortic valve is normal in structure. Aortic valve regurgitation is  mild. Mild aortic valve sclerosis is present, with no evidence of aortic  valve stenosis.   5. The inferior vena cava is normal in size with greater than 50%  respiratory variability, suggesting right atrial pressure of 3 mmHg.    ASSESSMENT & PLAN CHA2DS2-VASc Score = 3  The patient's score is based upon: CHF History: 0 HTN History: 1 Diabetes History: 0 Stroke History: 0 Vascular Disease History: 1 Age Score: 1 Gender Score: 0       ASSESSMENT AND PLAN: Paroxysmal Atrial Fibrillation (ICD10:  I48.0) The patient's CHA2DS2-VASc score is 3, indicating a 3.2% annual risk of stroke.    He is  currently in NSR. He overall appears to have very low burden. His previous episode to 3/19 was for about an hour in October 2024. Will continue with current regimen without change. Continue conservative observation via subsequent device interrogations.  Secondary Hypercoagulable State (ICD10:  D68.69) The patient is at significant risk for stroke/thromboembolism based upon his CHA2DS2-VASc Score of 3.    We discussed the risks vs benefits of anticoagulation in terms of stroke prevention and bleeding risk. Patient is definitely interested in beginning [facility] despite low burden of Afib. He is going to speak with oncologist first and also contact insurance drug plan regarding Eliquis 5 mg BID, Xarelto  20 mg daily, or Pradaxa 150 mg BID. If he does start OAC, will require 1 month CBC (can give order).   CrCl 62 mL/min from [facility] on 12/24/23.  Addendum 03/30/24: CBC on 03/24/24 is  normal.    Follow up 1 year Afib clinic.   Minnie Amber, PA-C  [facility] [address] [place], [place] [address] [phone]

## 2024-02-17 NOTE — Telephone Encounter (Signed)
 LMOVM for pt to send a manual transmission with his home remote monitor before his appointment today.

## 2024-02-18 ENCOUNTER — Other Ambulatory Visit (HOSPITAL_COMMUNITY): Payer: Self-pay | Admitting: *Deleted

## 2024-02-18 MED ORDER — RIVAROXABAN 20 MG PO TABS: 20.0000 mg | ORAL_TABLET | Freq: Every day | ORAL | 11 refills | Status: DC

## 2024-02-18 NOTE — Progress Notes (Unsigned)
 New patient visit   Patient: Edwin Webster   DOB: 1961-06-14   63 y.o. Male  MRN: [id number] Visit Date: 02/19/2024  Today's healthcare provider: Alfredia Ferguson, PA-C   No chief complaint on file.  Subjective    Edwin Webster is a 63 y.o. male who presents today as a new patient to establish care.   Afib,sick sinus, xarelto 20 has device -bispoprolol 10 mg ?  Olmesartan 40 mg   Crestor 40  Buspar 7.5 bid/tid? Lexapro 40  Osteo male?? Fosamax 70  cancer  Ibrutinib? cancer  Past Medical History:  Diagnosis Date   Allergy    seasonal allergies   Anemia    Anxiety    on meds   Arthritis    Chest pain, non-cardiac    COPD (chronic obstructive pulmonary disease) (HCC)    past smoker   Counseling regarding goals of care 03/27/2018   Depression    on meds   Diverticulitis    Diverticulosis    Fractured pelvis (HCC)    fall from ladder   GERD (gastroesophageal reflux disease)    on meds   Gout    History of shingles    Hyperlipidemia    on meds   Hypertension    on meds   Internal hemorrhoids    Iron deficiency anemia 03/13/2018   Kidney stones    Osteoporosis    Other abnormal glucose 10/11/2015   Presence of permanent cardiac pacemaker    Rheumatoid arthritis (HCC)    hands   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   Past Surgical History:  Procedure Laterality Date   COLONOSCOPY  2016   JMP-suprep(exc)-mild TICS/int hems/normal-5 yr recall   FINGER SURGERY Right    ring finger   iron infusion     KNEE SURGERY     right   KYPHOPLASTY Bilateral 10/10/2023   Procedure: KYPHOPLASTY Thoracic eight;  Surgeon: Lisbeth Renshaw, MD;  Location: [facility];  Service: Neurosurgery;  Laterality: Bilateral;   PACEMAKER IMPLANT N/A 08/10/2020   Procedure: PACEMAKER IMPLANT;  Surgeon: Regan Lemming, MD;  Location: [facility] IN[facility];  Service: Cardiovascular;  Laterality: N/A;   ROTATOR CUFF REPAIR  1996   left   WISDOM TOOTH EXTRACTION     Family  Status  Relation Name Status   Mother  Deceased at age 72   Brother  Alive   MGF  Deceased at age 68   Father  Alive   Child 2 Alive   Daughter Recruitment consultant Alive   Neg Hx  (Not Specified)  No partnership data on file   Family History  Problem Relation Age of Onset   Hyperlipidemia Mother    COPD Mother    Heart failure Mother    Colon polyps Mother    Breast cancer Mother    Rectal cancer Mother    Hyperlipidemia Brother    Colon polyps Brother    Stroke Maternal Grandfather    COPD Maternal Grandfather        colon   Colon cancer Maternal Grandfather 47   Colon polyps Maternal Grandfather 28   Hypertension Father    Colon polyps Daughter 13       lynch syndrome   Esophageal cancer Neg Hx    Stomach cancer Neg Hx    Social History   Socioeconomic History   Marital status: Married    Spouse name: Not on file   Number of children: Not on file   Years of education:  Not on file   Highest education level: 11th grade  Occupational History   Not on file  Tobacco Use   Smoking status: Former    Current packs/day: 0.00    Types: Cigarettes    Quit date: 07/03/2010    Years since quitting: 13.6   Smokeless tobacco: Never  Vaping Use   Vaping status: Never Used  Substance and Sexual Activity   Alcohol use: No    Alcohol/week: 0.0 standard drinks of alcohol   Drug use: No   Sexual activity: Not Currently  Other Topics Concern   Not on file  Social History Narrative   Not on file   Social Drivers of Health   Financial Resource Strain: Low Risk  (02/18/2024)   Overall Financial Resource Strain (CARDIA)    Difficulty of Paying Living Expenses: Not hard at all  Food Insecurity: No Food Insecurity (02/18/2024)   Hunger Vital Sign    Worried About Running Out of Food in the Last Year: Never true    Ran Out of Food in the Last Year: Never true  Transportation Needs: No Transportation Needs (02/18/2024)   PRAPARE - Administrator, Civil Service (Medical): No     Lack of Transportation (Non-Medical): No  Physical Activity: Insufficiently Active (02/18/2024)   Exercise Vital Sign    Days of Exercise per Week: 4 days    Minutes of Exercise per Session: 30 min  Stress: Stress Concern Present (02/18/2024)   [employer] of Occupational Health - Occupational Stress Questionnaire    Feeling of Stress : To some extent  Social Connections: Moderately Integrated (02/18/2024)   Social Connection and Isolation Panel [NHANES]    Frequency of Communication with Friends and Family: More than three times a week    Frequency of Social Gatherings with Friends and Family: More than three times a week    Attends Religious Services: More than 4 times per year    Active Member of [employer] or Organizations: No    Attends Engineer, structural: Not on file    Marital Status: Married   Outpatient Medications Prior to Visit  Medication Sig   rivaroxaban (XARELTO) 20 MG TABS tablet Take 1 tablet (20 mg total) by mouth daily with supper.   acetaminophen (TYLENOL) 650 MG CR tablet Take 650 mg by mouth as needed for pain.   acyclovir (ZOVIRAX) 200 MG capsule TAKE 1 CAPSULE BY MOUTH TWICE A DAY   alendronate (FOSAMAX) 70 MG tablet Take 1 tablet (70 mg total) by mouth every 7 (seven) days. Take with a full glass of water on an empty stomach and avoid reclining or lying down for 2 hours after taking this medication.   baclofen (LIORESAL) 10 MG tablet TAKE 1 /2 TO 1 TABLET 2 X /DAY IF NEEDED FOR MUSCLE SPASM (Patient taking differently: Only takes as needed)   betamethasone dipropionate (DIPROLENE) 0.05 % ointment Apply topically 2 (two) times daily. (Patient taking differently: Apply 1 application  topically daily as needed (Itching).)   bisoprolol (ZEBETA) 10 MG tablet Take   1 tablet  every Morning  for BP   busPIRone (BUSPAR) 7.5 MG tablet TAKE 1 TABLET 3 X /DAY FOR CHRONIC ANXIETY (Patient taking differently: as needed.)   Cholecalciferol (VITAMIN D-3) 5000 UNITS TABS  Take 5,000 Units by mouth daily.   escitalopram (LEXAPRO) 20 MG tablet Take 20 mg by mouth daily.   ezetimibe (ZETIA) 10 MG tablet TAKE 1 TABLET (10 MG TOTAL) BY MOUTH DAILY.  PLEASE KEEP SCHEDULED APPOINTMENT   folic acid (FOLVITE) 1 MG tablet TAKE 1 TABLET DAILY FOR FOLATE DEFICIENCY   ibrutinib (IMBRUVICA) 280 MG tablet Take 1 tablet (280 mg total) by mouth daily. Take with a glass of water.   ibuprofen (ADVIL) 800 MG tablet Take 1 tablet (800 mg total) by mouth every 8 (eight) hours as needed. (Patient taking differently: Take 800 mg by mouth as needed.)   ipratropium (ATROVENT) 0.06 % nasal spray Use 1 to 2 sprays each nostril 2 to 3 x /day as needed   levocetirizine (XYZAL) 5 MG tablet TAKE 1 TABLET BY MOUTH DAILY FOR ALLERGIES   olmesartan (BENICAR) 40 MG tablet Take 1 tablet (40 mg total) by mouth at bedtime. For BP   rosuvastatin (CRESTOR) 40 MG tablet TAKE 1 TABLET BY MOUTH EVERY DAY FOR CHOLESTEROL   triamcinolone cream (KENALOG) 0.1 % Apply  to skin  Rash   2 x /day   as needed   No facility-administered medications prior to visit.   Allergies  Allergen Reactions   Adalimumab Other (See Comments)    "blacked out", confused     Immunization History  Administered Date(s) Administered   DTaP 06/04/2011   Hepatitis B 11/26/2010   Hepatitis B, PED/ADOLESCENT 08/06/2011   Influenza Inj Mdck Quad Pf 09/28/2023   Influenza Inj Mdck Quad With Preservative 09/13/2017, 08/21/2018, 09/01/2020   Influenza Split 09/02/2014   Influenza Whole 09/17/2013   Influenza, High Dose Seasonal PF 09/26/2022   Influenza, Seasonal, Injecte, Preservative Fre 10/11/2015   Influenza-Unspecified 09/13/2017   Moderna Covid-19 Fall Seasonal Vaccine 42yrs & older 09/28/2023   PFIZER(Purple Top)SARS-COV-2 Vaccination 01/24/2020, 03/06/2020, 08/03/2020   PNEUMOCOCCAL CONJUGATE-20 12/06/2021   PPD Test 10/11/2015, 08/05/2019, 09/01/2020   Pneumococcal Polysaccharide-23 12/06/2020    Health Maintenance   Topic Date Due   DTaP/Tdap/Td (2 - Tdap) 06/03/2021   Medicare Annual Wellness (AWV)  01/03/2024   COVID-19 Vaccine (5 - Pfizer risk 2024-25 season) 03/27/2024   Colonoscopy  02/25/2028   Pneumonia Vaccine 47+ Years old  Completed   INFLUENZA VACCINE  Completed   Hepatitis C Screening  Completed   HIV Screening  Completed   HPV VACCINES  Aged Out   Zoster Vaccines- Shingrix  Discontinued    Patient Care Team: Lucky Cowboy, MD as PCP - General (Internal Medicine) Regan Lemming, MD as PCP - Electrophysiology (Cardiology)  Review of Systems  {Insert previous labs (optional):23779} {See past labs  Heme  Chem  Endocrine  Serology  Results Review (optional):1}   Objective    There were no vitals taken for this visit. {Insert last BP/Wt (optional):23777}{See vitals history (optional):1}   Physical Exam ***  Depression Screen    09/26/2022    2:06 AM 05/01/2022   12:14 PM 12/06/2021    1:13 PM 12/06/2021   11:49 AM  PHQ 2/9 Scores  PHQ - 2 Score 0 0 0 0  PHQ- 9 Score   0    No results found for any visits on 02/19/24.  Assessment & Plan     There are no diagnoses linked to this encounter.   No follow-ups on file.      Alfredia Ferguson, PA-C  [facility] [phone] (phone) [phone] (fax)  [facility]

## 2024-02-19 ENCOUNTER — Ambulatory Visit (INDEPENDENT_AMBULATORY_CARE_PROVIDER_SITE_OTHER): Admitting: Physician Assistant

## 2024-02-19 ENCOUNTER — Encounter: Payer: Self-pay | Admitting: Physician Assistant

## 2024-02-19 VITALS — BP 138/80 | HR 60 | Temp 97.8°F | Resp 16 | Ht 69.0 in | Wt 161.8 lb

## 2024-02-19 DIAGNOSIS — C8800 Waldenstrom macroglobulinemia not having achieved remission: Secondary | ICD-10-CM | POA: Diagnosis not present

## 2024-02-19 DIAGNOSIS — F3341 Major depressive disorder, recurrent, in partial remission: Secondary | ICD-10-CM

## 2024-02-19 DIAGNOSIS — I480 Paroxysmal atrial fibrillation: Secondary | ICD-10-CM | POA: Diagnosis not present

## 2024-02-19 DIAGNOSIS — I10 Essential (primary) hypertension: Secondary | ICD-10-CM

## 2024-02-19 DIAGNOSIS — M8088XS Other osteoporosis with current pathological fracture, vertebra(e), sequela: Secondary | ICD-10-CM | POA: Diagnosis not present

## 2024-02-19 DIAGNOSIS — F419 Anxiety disorder, unspecified: Secondary | ICD-10-CM | POA: Insufficient documentation

## 2024-02-19 DIAGNOSIS — E782 Mixed hyperlipidemia: Secondary | ICD-10-CM

## 2024-02-19 NOTE — Assessment & Plan Note (Signed)
 New dx this week. Pt was rx xarelto, has not picked up yet.  He has a pacemaker, follows with cardiology

## 2024-02-19 NOTE — Assessment & Plan Note (Signed)
 Managed with ibrutinib, follows with oncology, has regular labs, stable.

## 2024-02-19 NOTE — Assessment & Plan Note (Signed)
 Manages with crestor 40 mg, zetia 10 mg.  Last ldl still not at goal, follows with cardiology.

## 2024-02-19 NOTE — Assessment & Plan Note (Signed)
 History of T8 compression fracture s/p vertebroplasty , denies pain. Takes fosamax weekly

## 2024-02-19 NOTE — Assessment & Plan Note (Signed)
 Pt takes buspar 7.5 mg PRN -- but reports heightened anxiety. Encouraged regular use, if not TID, then BID or even once daily.

## 2024-02-19 NOTE — Assessment & Plan Note (Signed)
 Chronic, well controlled Manages with bisoprolol 20 mg and olemsartan 40 mg  Reviewed cmp F/u 6 mo

## 2024-02-19 NOTE — Assessment & Plan Note (Signed)
 Manages with lexapro 20 mg. Stable.

## 2024-02-25 ENCOUNTER — Other Ambulatory Visit: Payer: Self-pay

## 2024-02-27 ENCOUNTER — Telehealth (HOSPITAL_COMMUNITY): Payer: Self-pay | Admitting: *Deleted

## 2024-02-27 ENCOUNTER — Other Ambulatory Visit (HOSPITAL_COMMUNITY): Payer: Self-pay

## 2024-02-27 MED ORDER — RIVAROXABAN 15 MG PO TABS
15.0000 mg | ORAL_TABLET | Freq: Every day | ORAL | 6 refills | Status: DC
Start: 2024-02-27 — End: 2024-10-13

## 2024-02-27 NOTE — Progress Notes (Signed)
 Specialty Pharmacy Ongoing Clinical Assessment Note  Edwin Webster is a 63 y.o. male who is being followed by the specialty pharmacy service for RxSp Oncology   Patient's specialty medication(s) reviewed today: Ibrutinib (IMBRUVICA)   Missed doses in the last 4 weeks: 0   Patient/Caregiver did not have any additional questions or concerns.   Therapeutic benefit summary: Patient is achieving benefit   Adverse events/side effects summary: No adverse events/side effects   Patient's therapy is appropriate to: Continue    Goals Addressed             This Visit's Progress    Slow Disease Progression   On track    Patient is on track. Patient will maintain adherence.         Follow up:  3 months  Servando Snare Specialty Pharmacist   Clinical Intervention Note  Clinical Intervention Notes: DDI was identified with patient's new therapy, Xarelto.  Patient was counseled on the drug interaction, as Imbruvica can increase the concentration of Xarelto, and therefore, increase the risk of bleeding.  Counseled patient on all signs and symtpoms of bleeding to watch for, including dark tarry stools.  Counseled patient on when to call 911 or go to the nearest emergency room.  Edwin Webster is calling the prescriber for his Xarelto today, as Edwin Webster already had one bleeding episode with an injured hand that took longer to resolve than Edwin Webster felt was appropriate.  Edwin Webster was understanding and will reach back out to [place] if Edwin Webster requires further assistance, or has questions or concerns.   Clinical Intervention Outcomes: Prevention of an adverse drug event   Eulah Citizen

## 2024-02-27 NOTE — Progress Notes (Signed)
 Specialty Pharmacy Refill Coordination Note  Edwin Webster is a 63 y.o. male contacted today regarding refills of specialty medication(s) Ibrutinib [name]   Patient requested Delivery   Delivery date: 03/05/24   Verified address: [address] [name] [id number]   Medication will be filled on 03/04/24.

## 2024-02-27 NOTE — Telephone Encounter (Signed)
 Pt notified. Rx sent for Xarelto 15mg  once a day. Pt verbalized agreement.

## 2024-02-27 NOTE — Telephone Encounter (Signed)
-----   Message from [place] sent at 02/27/2024  3:48 PM EDT ----- Please reduce Xarelto to 15 mg daily. This is after speaking with 2 pharmacists to confirm. Thank you ----- Message ----- From: Shona Simpson, RN Sent: 02/27/2024   2:29 PM EDT To: Eustace Pen, PA-C  Pt questioning if his dose of xarelto should be decreased with addition of Imbruvica

## 2024-03-10 ENCOUNTER — Other Ambulatory Visit: Payer: Self-pay | Admitting: Physician Assistant

## 2024-03-10 DIAGNOSIS — I10 Essential (primary) hypertension: Secondary | ICD-10-CM

## 2024-03-10 MED ORDER — BISOPROLOL FUMARATE 10 MG PO TABS: ORAL_TABLET | ORAL | 3 refills | Status: AC

## 2024-03-10 NOTE — Telephone Encounter (Signed)
 Copied from CRM [id number]. Topic: Clinical - Medication Refill >> Mar 10, 2024  1:06 PM Chuck Crater wrote: Most Recent Primary Care Visit:  Provider: Trenton Frock  Department: [facility]  Visit Type: NEW PT - OFFICE VISIT  Date: 02/19/2024  Medication: bisoprolol (ZEBETA) 10 MG tablet  Has the patient contacted their pharmacy? No (Agent: If no, request that the patient contact the pharmacy for the refill. If patient does not wish to contact the pharmacy document the reason why and proceed with request.) (Agent: If yes, when and what did the pharmacy advise?)  always advised to call doctor  Is this the correct pharmacy for this prescription? Yes If no, delete pharmacy and type the correct one.  This is the patient's preferred pharmacy:  [id number] - [place], [place] - [address] [place] [address] Phone: [phone] Fax: [phone]  Has the prescription been filled recently? No  Is the patient out of the medication? No  Has the patient been seen for an appointment in the last year OR does the patient have an upcoming appointment? Yes  Can we respond through MyChart? Yes  Agent: Please be advised that Rx refills may take up to 3 business days. We ask that you follow-up with your pharmacy.

## 2024-03-16 ENCOUNTER — Other Ambulatory Visit: Payer: Self-pay

## 2024-03-18 ENCOUNTER — Telehealth: Payer: Self-pay | Admitting: Physician Assistant

## 2024-03-18 NOTE — Telephone Encounter (Signed)
 Copied from CRM [id number]. Topic: Medicare AWV >> Mar 18, 2024 11:22 AM Juliana Ocean wrote: Reason for CRM: LVM 03/18/2024 to schedule AWV. Please schedule Virtual or Telehealth visits ONLY.   Edwin Webster; Care Guide Ambulatory Clinical Support [place] [facility] Direct Dial: [phone]

## 2024-03-20 NOTE — Progress Notes (Signed)
 Remote pacemaker transmission.

## 2024-03-24 ENCOUNTER — Encounter: Payer: Self-pay | Admitting: Hematology & Oncology

## 2024-03-24 ENCOUNTER — Other Ambulatory Visit: Payer: Self-pay

## 2024-03-24 ENCOUNTER — Inpatient Hospital Stay (HOSPITAL_BASED_OUTPATIENT_CLINIC_OR_DEPARTMENT_OTHER): Payer: Medicare HMO | Admitting: Hematology & Oncology

## 2024-03-24 ENCOUNTER — Inpatient Hospital Stay: Payer: Medicare HMO | Attending: Hematology & Oncology

## 2024-03-24 VITALS — BP 153/90 | HR 60 | Temp 97.8°F | Resp 18 | Ht 69.0 in | Wt 162.0 lb

## 2024-03-24 DIAGNOSIS — C8800 Waldenstrom macroglobulinemia not having achieved remission: Secondary | ICD-10-CM | POA: Diagnosis not present

## 2024-03-24 DIAGNOSIS — D500 Iron deficiency anemia secondary to blood loss (chronic): Secondary | ICD-10-CM | POA: Diagnosis not present

## 2024-03-24 DIAGNOSIS — D509 Iron deficiency anemia, unspecified: Secondary | ICD-10-CM | POA: Insufficient documentation

## 2024-03-24 DIAGNOSIS — Z950 Presence of cardiac pacemaker: Secondary | ICD-10-CM | POA: Insufficient documentation

## 2024-03-24 LAB — CMP (CANCER CENTER ONLY)
ALT: 9 U/L (ref 0–44)
AST: 16 U/L (ref 15–41)
Albumin: 3.5 g/dL (ref 3.5–5.0)
Alkaline Phosphatase: 64 U/L (ref 38–126)
Anion gap: 13 (ref 5–15)
BUN: 11 mg/dL (ref 8–23)
CO2: 22 mmol/L (ref 22–32)
Calcium: 8.9 mg/dL (ref 8.9–10.3)
Chloride: 104 mmol/L (ref 98–111)
Creatinine: 1.19 mg/dL (ref 0.61–1.24)
GFR, Estimated: >60 mL/min (ref >60)
Glucose, Bld: 69 mg/dL — ABNORMAL LOW (ref 70–99)
Potassium: 3.6 mmol/L (ref 3.5–5.1)
Sodium: 139 mmol/L (ref 135–145)
Total Bilirubin: 0.8 mg/dL (ref 0.0–1.2)
Total Protein: 7.2 g/dL (ref 6.5–8.1)

## 2024-03-24 LAB — CBC WITH DIFFERENTIAL (CANCER CENTER ONLY)
Abs Immature Granulocytes: 0.01 10*3/uL (ref 0.00–0.07)
Basophils Absolute: 0 10*3/uL (ref 0.0–0.1)
Basophils Relative: 1 %
Eosinophils Absolute: 0.1 10*3/uL (ref 0.0–0.5)
Eosinophils Relative: 2 %
HCT: 41.9 % (ref 39.0–52.0)
Hemoglobin: 14.1 g/dL (ref 13.0–17.0)
Immature Granulocytes: 0 %
Lymphocytes Relative: 16 %
Lymphs Abs: 0.8 10*3/uL (ref 0.7–4.0)
MCH: 32 pg (ref 26.0–34.0)
MCHC: 33.7 g/dL (ref 30.0–36.0)
MCV: 95.2 fL (ref 80.0–100.0)
Monocytes Absolute: 0.6 10*3/uL (ref 0.1–1.0)
Monocytes Relative: 13 %
Neutro Abs: 3.4 10*3/uL (ref 1.7–7.7)
Neutrophils Relative %: 68 %
Platelet Count: 221 10*3/uL (ref 150–400)
RBC: 4.4 MIL/uL (ref 4.22–5.81)
RDW: 13.8 % (ref 11.5–15.5)
WBC Count: 5 10*3/uL (ref 4.0–10.5)
nRBC: 0 % (ref 0.0–0.2)

## 2024-03-24 LAB — FERRITIN: Ferritin: 37 ng/mL (ref 24–336)

## 2024-03-24 LAB — IRON AND IRON BINDING CAPACITY (CC-WL,HP ONLY)
Iron: 93 ug/dL (ref 45–182)
Saturation Ratios: 21 % (ref 17.9–39.5)
TIBC: 435 ug/dL (ref 250–450)
UIBC: 342 ug/dL (ref 117–376)

## 2024-03-24 LAB — LACTATE DEHYDROGENASE: LDH: 176 U/L (ref 98–192)

## 2024-03-24 NOTE — Progress Notes (Signed)
 Hematology and Oncology Follow Up Visit  Edwin Webster [id number] Dec 23, 1960 63 y.o. 03/24/2024   Principle Diagnosis:  Waldenstrom's macroglobulinemia Iron  deficiency anemia  Current Therapy:   Rituxan /bendamustine -s/p cycle 2 -- d/c on 06/11/2018 Imbruvica   280 mg po q day -- changed on 08/06/2019 IV iron  as indicated - last received on 08/16/2022      Interim History:  Edwin Webster is here today for follow-up.  He is doing pretty well.  We last saw him back in January.  At that point in time, he really had no complaints.  He did have I think vertebroplasty at T8 back in November.  He continues on the Imbruvica  for the Waldenstrom's.  He has responded very nicely.  His last monoclonal spike was 0.4 g/dL.  The IgM level was 480 mg/dL.  He does have a pacemaker in.  Thankfully, he has had no issues with the pacemaker.  He does have anticoagulation with Xarelto .  He has had no bleeding.  I told him that the Xarelto  will not affect the Imbruvica  or vice versa.  He has had a good appetite.  He had no problems over the Easter holiday.  He has had no rashes.  He has had no leg swelling.  Of note, he had a MRI of the brain back in February.  Everything looked okay.  He has some chronic small vessel ischemic changes.  They seem to be a little bit progressive since 2018.  Otherwise, there is nothing that looked suspicious.  His last iron  studies that were done back in January showed a ferritin of 20 with an iron  saturation of 31%.  He has had no cough.  He has had no problems with COVID or Influenza.  There is been no change in bowel or bladder habits.  He has had no diarrhea.  Currently, his performance status is ECOG 1.    Medications:  Allergies as of 03/24/2024       Reactions   Adalimumab Other (See Comments)   "blacked out", confused        Medication List        Accurate as of March 24, 2024 12:12 PM. If you have any questions, ask your nurse or doctor.           STOP taking these medications    ibuprofen  800 MG tablet Commonly known as: ADVIL  Stopped by: Ivor Mars       TAKE these medications    acetaminophen  650 MG CR tablet Commonly known as: TYLENOL  Take 650 mg by mouth as needed for pain.   acyclovir  200 MG capsule Commonly known as: ZOVIRAX  TAKE 1 CAPSULE BY MOUTH TWICE A DAY   alendronate  70 MG tablet Commonly known as: FOSAMAX  Take 1 tablet (70 mg total) by mouth every 7 (seven) days. Take with a full glass of water on an empty stomach and avoid reclining or lying down for 2 hours after taking this medication.   baclofen  10 MG tablet Commonly known as: LIORESAL  TAKE 1 /2 TO 1 TABLET 2 X /DAY IF NEEDED FOR MUSCLE SPASM What changed: See the new instructions.   betamethasone  dipropionate 0.05 % ointment Commonly known as: DIPROLENE  Apply topically 2 (two) times daily. What changed:  how much to take when to take this reasons to take this   bisoprolol  10 MG tablet Commonly known as: ZEBETA  Take   1 tablet  every Morning  for BP   busPIRone  7.5 MG tablet Commonly known as: BUSPAR  TAKE  1 TABLET 3 X /DAY FOR CHRONIC ANXIETY What changed: See the new instructions.   escitalopram  20 MG tablet Commonly known as: LEXAPRO  Take 20 mg by mouth daily.   ezetimibe  10 MG tablet Commonly known as: ZETIA  TAKE 1 TABLET (10 MG TOTAL) BY MOUTH DAILY. PLEASE KEEP SCHEDULED APPOINTMENT   folic acid  1 MG tablet Commonly known as: FOLVITE  TAKE 1 TABLET DAILY FOR FOLATE DEFICIENCY   Imbruvica  280 MG tablet Generic drug: ibrutinib  Take 1 tablet (280 mg total) by mouth daily. Take with a glass of water.   ipratropium 0.06 % nasal spray Commonly known as: ATROVENT  Use 1 to 2 sprays each nostril 2 to 3 x /day as needed   levocetirizine 5 MG tablet Commonly known as: XYZAL  TAKE 1 TABLET BY MOUTH DAILY FOR ALLERGIES   olmesartan  40 MG tablet Commonly known as: BENICAR  Take 1 tablet (40 mg total) by mouth at bedtime.  For BP   Rivaroxaban  15 MG Tabs tablet Commonly known as: XARELTO  Take 1 tablet (15 mg total) by mouth daily with supper.   rosuvastatin  40 MG tablet Commonly known as: CRESTOR  TAKE 1 TABLET BY MOUTH EVERY DAY FOR CHOLESTEROL   triamcinolone  cream 0.1 % Commonly known as: KENALOG  Apply  to skin  Rash   2 x /day   as needed   Vitamin D -3 125 MCG (5000 UT) Tabs Take 5,000 Units by mouth daily.        Allergies:  Allergies  Allergen Reactions   Adalimumab Other (See Comments)    "blacked out", confused     Past Medical History, Surgical history, Social history, and Family History were reviewed and updated.   His vital signs show a temperature of 97.8.  Pulse 60.  Blood pressure 153/90.  Weight is 162 pounds.    Review of Systems: Review of Systems  Constitutional: Negative.   HENT: Negative.    Eyes: Negative.   Respiratory: Negative.    Cardiovascular: Negative.   Gastrointestinal:  Negative for blood in stool.  Genitourinary: Negative.   Musculoskeletal:  Positive for back pain.  Skin: Negative.   Neurological: Negative.   Endo/Heme/Allergies: Negative.   Psychiatric/Behavioral: Negative.        Physical Exam:   Wt Readings from Last 3 Encounters:  03/24/24 162 lb (73.5 kg)  02/19/24 161 lb 12.8 oz (73.4 kg)  02/17/24 162 lb 9.6 oz (73.8 kg)    Physical Exam Vitals reviewed.  HENT:     Head: Normocephalic and atraumatic.  Eyes:     Pupils: Pupils are equal, round, and reactive to light.  Cardiovascular:     Rate and Rhythm: Normal rate and regular rhythm.     Heart sounds: Normal heart sounds.  Pulmonary:     Effort: Pulmonary effort is normal.     Breath sounds: Normal breath sounds.  Abdominal:     General: Bowel sounds are normal.     Palpations: Abdomen is soft.  Musculoskeletal:        General: No tenderness or deformity. Normal range of motion.     Cervical back: Normal range of motion.  Lymphadenopathy:     Cervical: No cervical  adenopathy.  Skin:    General: Skin is warm and dry.     Findings: No erythema or rash.  Neurological:     Mental Status: He is alert and oriented to person, place, and time.  Psychiatric:        Behavior: Behavior normal.  Thought Content: Thought content normal.        Judgment: Judgment normal.      Lab Results  Component Value Date   WBC 5.0 03/24/2024   HGB 14.1 03/24/2024   HCT 41.9 03/24/2024   MCV 95.2 03/24/2024   PLT 221 03/24/2024   Lab Results  Component Value Date   FERRITIN 20 (L) 12/24/2023   IRON  134 12/24/2023   TIBC 427 12/24/2023   UIBC 293 12/24/2023   IRONPCTSAT 31 12/24/2023   Lab Results  Component Value Date   RETICCTPCT 1.2 01/11/2022   RBC 4.40 03/24/2024   RETICCTABS 33,120 02/12/2018   Lab Results  Component Value Date   KPAFRELGTCHN 20.4 (H) 12/24/2023   LAMBDASER 11.1 12/24/2023   KAPLAMBRATIO 1.84 (H) 12/24/2023   Lab Results  Component Value Date   IGGSERUM 581 (L) 12/24/2023   IGGSERUM 647 12/24/2023   IGA 49 (L) 12/24/2023   IGA 57 (L) 12/24/2023   IGMSERUM 437 (H) 12/24/2023   IGMSERUM 507 (H) 12/24/2023   Lab Results  Component Value Date   TOTALPROTELP 6.8 12/24/2023   ALBUMINELP 3.9 12/24/2023   A1GS 0.3 12/24/2023   A2GS 0.7 12/24/2023   BETS 1.0 12/24/2023   BETA2SER 0.2 02/18/2018   GAMS 0.9 12/24/2023   MSPIKE 0.4 (H) 12/24/2023   SPEI Comment 01/11/2022     Chemistry      Component Value Date/Time   NA 139 03/24/2024 1000   NA 141 08/04/2020 1203   K 3.6 03/24/2024 1000   CL 104 03/24/2024 1000   CO2 22 03/24/2024 1000   BUN 11 03/24/2024 1000   BUN 13 08/04/2020 1203   CREATININE 1.19 03/24/2024 1000   CREATININE 1.19 04/04/2023 1042      Component Value Date/Time   CALCIUM  8.9 03/24/2024 1000   ALKPHOS 64 03/24/2024 1000   AST 16 03/24/2024 1000   ALT 9 03/24/2024 1000   BILITOT 0.8 03/24/2024 1000      Impression and Plan: Edwin Webster is a very pleasant 63 yo caucasian gentleman with  Waldenstrom's macroglobulinemia.  He is on single agent Imbruvica .  He is doing well on this from my point of view.  We will check his monoclonal studies and see how they look.  I am happy that his quality life is doing so well right now.  I will plan to get him back to see me now probably about 2 or 3 months.  Will try to get him through most of the Summer, if possible.   Ivor Mars, MD 4/29/202512:12 PM

## 2024-03-25 ENCOUNTER — Encounter: Payer: Self-pay | Admitting: *Deleted

## 2024-03-25 LAB — KAPPA/LAMBDA LIGHT CHAINS
Kappa free light chain: 20.7 mg/L — ABNORMAL HIGH (ref 3.3–19.4)
Kappa, lambda light chain ratio: 1.59 (ref 0.26–1.65)
Lambda free light chains: 13 mg/L (ref 5.7–26.3)

## 2024-03-25 LAB — IGG, IGA, IGM
IgA: 65 mg/dL (ref 61–437)
IgG (Immunoglobin G), Serum: 611 mg/dL (ref 603–1613)
IgM (Immunoglobulin M), Srm: 414 mg/dL — ABNORMAL HIGH (ref 20–172)

## 2024-03-30 NOTE — Addendum Note (Signed)
 Encounter addended by: Nathanel Bal, PA-C on: 03/30/2024 8:29 AM  Actions taken: Clinical Note Signed

## 2024-03-31 ENCOUNTER — Encounter: Payer: Self-pay | Admitting: *Deleted

## 2024-03-31 LAB — PROTEIN ELECTROPHORESIS, SERUM, WITH REFLEX
A/G Ratio: 1.4 (ref 0.7–1.7)
Albumin ELP: 3.8 g/dL (ref 2.9–4.4)
Alpha-1-Globulin: 0.3 g/dL (ref 0.0–0.4)
Alpha-2-Globulin: 0.8 g/dL (ref 0.4–1.0)
Beta Globulin: 1 g/dL (ref 0.7–1.3)
Gamma Globulin: 0.8 g/dL (ref 0.4–1.8)
Globulin, Total: 2.8 g/dL (ref 2.2–3.9)
M-Spike, %: 0.3 g/dL — ABNORMAL HIGH
SPEP Interpretation: 0
Total Protein ELP: 6.6 g/dL (ref 6.0–8.5)

## 2024-03-31 LAB — IMMUNOFIXATION REFLEX, SERUM
IgA: 63 mg/dL (ref 61–437)
IgG (Immunoglobin G), Serum: 624 mg/dL (ref 603–1613)
IgM (Immunoglobulin M), Srm: 447 mg/dL — ABNORMAL HIGH (ref 20–172)

## 2024-04-02 ENCOUNTER — Other Ambulatory Visit: Payer: Self-pay

## 2024-04-06 ENCOUNTER — Other Ambulatory Visit (HOSPITAL_COMMUNITY): Payer: Self-pay

## 2024-04-06 ENCOUNTER — Other Ambulatory Visit: Payer: Self-pay

## 2024-04-06 NOTE — Progress Notes (Signed)
 Specialty Pharmacy Refill Coordination Note  Edwin Webster is a 63 y.o. male contacted today regarding refills of specialty medication(s) Ibrutinib  (IMBRUVICA )   Patient requested Delivery   Delivery date: 04/07/24   Verified address: [address]  [place] [address]   Medication will be filled on 04/06/24.

## 2024-04-29 ENCOUNTER — Ambulatory Visit (INDEPENDENT_AMBULATORY_CARE_PROVIDER_SITE_OTHER): Admitting: *Deleted

## 2024-04-29 VITALS — Ht 69.0 in | Wt 161.0 lb

## 2024-04-29 DIAGNOSIS — Z0000 Encounter for general adult medical examination without abnormal findings: Secondary | ICD-10-CM | POA: Diagnosis not present

## 2024-04-29 NOTE — Patient Instructions (Signed)
 Edwin Webster , Thank you for taking time out of your busy schedule to complete your Annual Wellness Visit with me. I enjoyed our conversation and look forward to speaking with you again next year. I, as well as your care team,  appreciate your ongoing commitment to your health goals. Please review the following plan we discussed and let me know if I can assist you in the future. Your Game plan/ To Do List    Follow up Visits: Next Medicare AWV with our clinical staff: 04/30/25 9:40am   Next Office Visit with your provider: 11/23/24 10:20am  Please get your tetanus and COVID boosters at your local pharmacy.  Clinician Recommendations:  Aim for 30 minutes of exercise or brisk walking, 6-8 glasses of water, and 5 servings of fruits and vegetables each day.       This is a list of the screening recommended for you and due dates:  Health Maintenance  Topic Date Due   DTaP/Tdap/Td vaccine (2 - Tdap) 06/03/2021   Screening for Lung Cancer  08/03/2021   Medicare Annual Wellness Visit  01/03/2024   COVID-19 Vaccine (5 - Pfizer risk 2024-25 season) 03/27/2024   Flu Shot  06/26/2024   Colon Cancer Screening  02/25/2028   Pneumonia Vaccine  Completed   Hepatitis C Screening  Completed   HIV Screening  Completed   HPV Vaccine  Aged Out   Meningitis B Vaccine  Aged Out   Zoster (Shingles) Vaccine  Discontinued    Advanced directives: (Copy Requested) Please bring a copy of your health care power of attorney and living will to the office to be added to your chart at your convenience. You can mail to [facility] [address] [place], [place] [address] or email to [email or web address] Advance Care Planning is important because it:  [x]  Makes sure you receive the medical care that is consistent with your values, goals, and preferences  [x]  It provides guidance to your family and loved ones and reduces their decisional burden about whether or not they are making the right  decisions based on your wishes.  Follow the link provided in your after visit summary or read over the paperwork we have mailed to you to help you started getting your Advance Directives in place. If you need assistance in completing these, please reach out to us  so that we can help you!  See attachments for Preventive Care and Fall Prevention Tips.

## 2024-04-29 NOTE — Progress Notes (Signed)
 Subjective:   Edwin Webster is a 63 y.o. who presents for a Medicare Wellness preventive visit.  As a reminder, Annual Wellness Visits don't include a physical exam, and some assessments may be limited, especially if this visit is performed virtually. We may recommend an in-person follow-up visit with your provider if needed.  Visit Complete: Virtual I connected with  Edwin Webster on 04/29/24 by a audio enabled telemedicine application and verified that I am speaking with the correct person using two identifiers.  Patient Location: Home  Provider Location: Office/Clinic  I discussed the limitations of evaluation and management by telemedicine. The patient expressed understanding and agreed to proceed.  Vital Signs: Because this visit was a virtual/telehealth visit, some criteria may be missing or patient reported. Any vitals not documented were not able to be obtained and vitals that have been documented are patient reported.  VideoDeclined- This patient declined Librarian, academic. Therefore the visit was completed with audio only.  Persons Participating in Visit: Patient.  AWV Questionnaire: No: Patient Medicare AWV questionnaire was not completed prior to this visit.  Cardiac Risk Factors include: advanced age (>34men, >79 women);male gender;hypertension;dyslipidemia     Objective:     Today's Vitals   04/29/24 0942  Weight: 161 lb (73 kg)  Height: 5\' 9"  (1.753 m)   Body mass index is 23.78 kg/m.     04/29/2024   10:07 AM 03/24/2024   10:23 AM 12/24/2023    9:58 AM 10/10/2023    8:36 AM 09/23/2023    8:26 AM 06/18/2023   10:16 AM 05/31/2023   11:41 AM  Advanced Directives  Does Patient Have a Medical Advance Directive? Yes Yes Yes Yes  Yes No  Type of Estate agent of [place];Living will Living will Living will;Healthcare Power of Attorney  Living will;Healthcare Power of [employer] Power of [place];Living will    Does patient want to make changes to medical advance directive? No - Patient declined No - Patient declined No - Patient declined No - Patient declined     Copy of Healthcare Power of Attorney in Chart? No - copy requested     No - copy requested   Would patient like information on creating a medical advance directive?      No - Patient declined No - Patient declined    Current Medications (verified) Outpatient Encounter Medications as of 04/29/2024  Medication Sig   acetaminophen  (TYLENOL ) 650 MG CR tablet Take 650 mg by mouth as needed for pain.   acyclovir  (ZOVIRAX ) 200 MG capsule TAKE 1 CAPSULE BY MOUTH TWICE A DAY   alendronate  (FOSAMAX ) 70 MG tablet Take 1 tablet (70 mg total) by mouth every 7 (seven) days. Take with a full glass of water on an empty stomach and avoid reclining or lying down for 2 hours after taking this medication.   baclofen  (LIORESAL ) 10 MG tablet TAKE 1 /2 TO 1 TABLET 2 X /DAY IF NEEDED FOR MUSCLE SPASM (Patient taking differently: No sig reported)   betamethasone  dipropionate (DIPROLENE ) 0.05 % ointment Apply topically 2 (two) times daily. (Patient taking differently: Apply 1 application  topically daily as needed (Itching).)   bisoprolol  (ZEBETA ) 10 MG tablet Take   1 tablet  every Morning  for BP   busPIRone  (BUSPAR ) 7.5 MG tablet TAKE 1 TABLET 3 X /DAY FOR CHRONIC ANXIETY (Patient taking differently: as needed.)   Cholecalciferol (VITAMIN D -3) 5000 UNITS TABS Take 5,000 Units by mouth daily.  escitalopram  (LEXAPRO ) 20 MG tablet Take 20 mg by mouth daily.   ezetimibe  (ZETIA ) 10 MG tablet TAKE 1 TABLET (10 MG TOTAL) BY MOUTH DAILY. PLEASE KEEP SCHEDULED APPOINTMENT   folic acid  (FOLVITE ) 1 MG tablet TAKE 1 TABLET DAILY FOR FOLATE DEFICIENCY   ibrutinib  (IMBRUVICA ) 280 MG tablet Take 1 tablet (280 mg total) by mouth daily. Take with a glass of water.   ipratropium (ATROVENT ) 0.06 % nasal spray Use 1 to 2 sprays each nostril 2 to 3 x /day as needed   levocetirizine  (XYZAL ) 5 MG tablet TAKE 1 TABLET BY MOUTH DAILY FOR ALLERGIES   olmesartan  (BENICAR ) 40 MG tablet Take 1 tablet (40 mg total) by mouth at bedtime. For BP   rivaroxaban  (XARELTO ) 15 MG TABS tablet Take 1 tablet (15 mg total) by mouth daily with supper.   rosuvastatin  (CRESTOR ) 40 MG tablet TAKE 1 TABLET BY MOUTH EVERY DAY FOR CHOLESTEROL   triamcinolone  cream (KENALOG ) 0.1 % Apply  to skin  Rash   2 x /day   as needed   No facility-administered encounter medications on file as of 04/29/2024.    Allergies (verified) Adalimumab   History: Past Medical History:  Diagnosis Date   Allergy    seasonal allergies   Anemia    Anxiety    on meds   Arthritis    Chest pain, non-cardiac    COPD (chronic obstructive pulmonary disease) (HCC)    past smoker   Counseling regarding goals of care 03/27/2018   Depression    on meds   Diverticulitis    Diverticulosis    Fractured pelvis (HCC)    fall from ladder   GERD (gastroesophageal reflux disease)    on meds   Gout    History of shingles    Hyperlipidemia    on meds   Hypertension    on meds   Internal hemorrhoids    Iron  deficiency anemia 03/13/2018   Kidney stones    Osteoporosis    Other abnormal glucose 10/11/2015   Presence of permanent cardiac pacemaker    Rheumatoid arthritis (HCC)    hands   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   Past Surgical History:  Procedure Laterality Date   COLONOSCOPY  2016   JMP-suprep(exc)-mild TICS/int hems/normal-5 yr recall   FINGER SURGERY Right    ring finger   iron  infusion     KNEE SURGERY     right   KYPHOPLASTY Bilateral 10/10/2023   Procedure: KYPHOPLASTY Thoracic eight;  Surgeon: Augusto Blonder, MD;  Location: [facility] OR;  Service: Neurosurgery;  Laterality: Bilateral;   PACEMAKER IMPLANT N/A 08/10/2020   Procedure: PACEMAKER IMPLANT;  Surgeon: Lei Pump, MD;  Location: [facility] INV[facility];  Service: Cardiovascular;  Laterality: N/A;   ROTATOR CUFF REPAIR  1996    left   SPINE SURGERY     WISDOM TOOTH EXTRACTION     Family History  Problem Relation Age of Onset   Hyperlipidemia Mother    COPD Mother    Heart failure Mother    Colon polyps Mother    Breast cancer Mother    Rectal cancer Mother    Hyperlipidemia Brother    Colon polyps Brother    Stroke Maternal Grandfather    COPD Maternal Grandfather        colon   Colon cancer Maternal Grandfather 35   Colon polyps Maternal Grandfather 24   Hypertension Father    Colon polyps Daughter 76  lynch syndrome   ADD / ADHD Daughter    Esophageal cancer Neg Hx    Stomach cancer Neg Hx    Social History   Socioeconomic History   Marital status: Married    Spouse name: Not on file   Number of children: Not on file   Years of education: Not on file   Highest education level: 11th grade  Occupational History   Not on file  Tobacco Use   Smoking status: Former    Current packs/day: 0.00    Average packs/day: 2.0 packs/day for 35.6 years (71.2 ttl pk-yrs)    Types: Cigarettes    Start date: 45    Quit date: 07/03/2010    Years since quitting: 13.8   Smokeless tobacco: Never  Vaping Use   Vaping status: Never Used  Substance and Sexual Activity   Alcohol use: No    Alcohol/week: 0.0 standard drinks of alcohol   Drug use: No   Sexual activity: Not Currently  Other Topics Concern   Not on file  Social History Narrative   Not on file   Social Drivers of Health   Financial Resource Strain: Low Risk  (04/29/2024)   Overall Financial Resource Strain (CARDIA)    Difficulty of Paying Living Expenses: Not very hard  Food Insecurity: No Food Insecurity (04/29/2024)   Hunger Vital Sign    Worried About Running Out of Food in the Last Year: Never true    Ran Out of Food in the Last Year: Never true  Transportation Needs: No Transportation Needs (04/29/2024)   PRAPARE - Administrator, Civil Service (Medical): No    Lack of Transportation (Non-Medical): No  Physical  Activity: Insufficiently Active (04/29/2024)   Exercise Vital Sign    Days of Exercise per Week: 1 day    Minutes of Exercise per Session: 60 min  Stress: No Stress Concern Present (04/29/2024)   [employer] of Occupational Health - Occupational Stress Questionnaire    Feeling of Stress : Not at all  Recent Concern: Stress - Stress Concern Present (02/18/2024)   [employer] of Occupational Health - Occupational Stress Questionnaire    Feeling of Stress : To some extent  Social Connections: Moderately Integrated (04/29/2024)   Social Connection and Isolation Panel [NHANES]    Frequency of Communication with Friends and Family: More than three times a week    Frequency of Social Gatherings with Friends and Family: More than three times a week    Attends Religious Services: 1 to 4 times per year    Active Member of [employer] or Organizations: No    Attends Banker Meetings: Never    Marital Status: Married    Tobacco Counseling Counseling given: Not Answered    Clinical Intake:  Pre-visit preparation completed: Yes  Pain : No/denies pain     BMI - recorded: 23.78  Lab Results  Component Value Date   HGBA1C 5.4 10/28/2023   HGBA1C 5.5 04/04/2023   HGBA1C 5.4 09/26/2022     How often do you need to have someone help you when you read instructions, pamphlets, or other written materials from your doctor or pharmacy?: 1 - Never What is the last grade level you completed in school?: 11  Interpreter Needed?: No  Information entered by :: Susa Engman, CMA   Activities of Daily Living     04/29/2024    9:56 AM 10/10/2023    8:35 AM  In your present state of health,  do you have any difficulty performing the following activities:  Hearing? 0   Comment bilateral hearing aids   Vision? 0   Comment Wears glasses, last eye exam 6 weeks ago with Larrie Po   Difficulty concentrating or making decisions? 0   Walking or climbing stairs? 1   Comment has  joint pain and balance issue. holds to stair rails,   Dressing or bathing? 0   Doing errands, shopping? 0 0  Preparing Food and eating ? N   Using the Toilet? N   In the past six months, have you accidently leaked urine? N   Do you have problems with loss of bowel control? N   Managing your Medications? N   Managing your Finances? N   Housekeeping or managing your Housekeeping? N     Patient Care Team: Trenton Frock, PA-C as PCP - General (Physician Assistant) Lei Pump, MD as PCP - Electrophysiology (Cardiology) Levander Re, MD as Referring Physician (Ophthalmology)  I have updated your Care Teams any recent Medical Services you may have received from other providers in the past year.     Assessment:    This is a routine wellness examination for Edwin Webster.  Hearing/Vision screen Hearing Screening - Comments:: Bilateral hearing aids Vision Screening - Comments:: Wears glasses. Last eye exam 6 weeks ago with Larrie Po   Goals Addressed             This Visit's Progress    Patient Stated       Pt wants to increase water intake. Will start small and work way up.       Depression Screen     04/29/2024   10:03 AM 09/26/2022    2:06 AM 05/01/2022   12:14 PM 12/06/2021    1:13 PM 12/06/2021   11:49 AM 09/17/2021   10:53 PM 03/13/2021   10:44 PM  PHQ 2/9 Scores  PHQ - 2 Score 0 0 0 0 0 0 0  PHQ- 9 Score    0       Fall Risk     04/29/2024   10:06 AM 02/19/2024    8:24 AM 01/02/2023   11:00 AM 09/26/2022    2:06 AM 12/06/2021   11:49 AM  Fall Risk   Falls in the past year? 1 0 1 0 0  Number falls in past yr: 1 0 0  0  Comment gets off balance, has seen doctor      Injury with Fall? 0 0 1  0  Risk for fall due to : History of fall(s);Impaired balance/gait;Impaired mobility    No Fall Risks  Follow up Education provided Falls evaluation completed   Falls prevention discussed;Falls evaluation completed    MEDICARE RISK AT HOME:  Medicare Risk at  Home Any stairs in or around the home?: Yes If so, are there any without handrails?: No Home free of loose throw rugs in walkways, pet beds, electrical cords, etc?: Yes Adequate lighting in your home to reduce risk of falls?: Yes Life alert?: No Use of a cane, walker or w/c?: No Grab bars in the bathroom?: No Shower chair or bench in shower?: No Elevated toilet seat or a handicapped toilet?: No  TIMED UP AND GO:  Was the test performed?  No, audio completed  Cognitive Function: 6CIT completed        04/29/2024   10:08 AM  6CIT Screen  What Year? 0 points  What month? 0 points  What time?  0 points  Count back from 20 0 points  Repeat phrase 6 points    Immunizations Immunization History  Administered Date(s) Administered   DTaP 06/04/2011   Hepatitis B 11/26/2010   Hepatitis B, PED/ADOLESCENT 08/06/2011   Influenza Inj Mdck Quad Pf 09/28/2023   Influenza Inj Mdck Quad With Preservative 09/13/2017, 08/21/2018, 09/01/2020   Influenza Split 09/02/2014   Influenza Whole 09/17/2013   Influenza, High Dose Seasonal PF 09/26/2022   Influenza, Seasonal, Injecte, Preservative Fre 10/11/2015   Influenza-Unspecified 09/13/2017   Moderna Covid-19 Fall Seasonal Vaccine 3yrs & older 09/28/2023   PFIZER(Purple Top)SARS-COV-2 Vaccination 01/24/2020, 03/06/2020, 08/03/2020   PNEUMOCOCCAL CONJUGATE-20 12/06/2021   PPD Test 10/11/2015, 08/05/2019, 09/01/2020   Pneumococcal Polysaccharide-23 12/06/2020    Screening Tests Health Maintenance  Topic Date Due   DTaP/Tdap/Td (2 - Tdap) 06/03/2021   Lung Cancer Screening  08/03/2021   Medicare Annual Wellness (AWV)  01/03/2024   COVID-19 Vaccine (5 - Pfizer risk 2024-25 season) 03/27/2024   INFLUENZA VACCINE  06/26/2024   Colonoscopy  02/25/2028   Pneumonia Vaccine 67+ Years old  Completed   Hepatitis C Screening  Completed   HIV Screening  Completed   HPV VACCINES  Aged Out   Meningococcal B Vaccine  Aged Out   Zoster Vaccines-  Shingrix  Discontinued    Health Maintenance  Health Maintenance Due  Topic Date Due   DTaP/Tdap/Td (2 - Tdap) 06/03/2021   Lung Cancer Screening  08/03/2021   Medicare Annual Wellness (AWV)  01/03/2024   COVID-19 Vaccine (5 - Pfizer risk 2024-25 season) 03/27/2024   Health Maintenance Items Addressed: Pt will get tetanus and COVID boosters at local pharmacy .  Additional Screening:  Vision Screening: Recommended annual ophthalmology exams for early detection of glaucoma and other disorders of the eye. Would you like a referral to an eye doctor? No    Dental Screening: Recommended annual dental exams for proper oral hygiene  Community Resource Referral / Chronic Care Management: CRR required this visit?  No   CCM required this visit?  No   Plan:    I have personally reviewed and noted the following in the patient's chart:   Medical and social history Use of alcohol, tobacco or illicit drugs  Current medications and supplements including opioid prescriptions. Patient is not currently taking opioid prescriptions. Functional ability and status Nutritional status Physical activity Advanced directives List of other physicians Hospitalizations, surgeries, and ER visits in previous 12 months Vitals Screenings to include cognitive, depression, and falls Referrals and appointments  In addition, I have reviewed and discussed with patient certain preventive protocols, quality metrics, and best practice recommendations. A written personalized care plan for preventive services as well as general preventive health recommendations were provided to patient.   Susa Engman, CMA   04/29/2024   After Visit Summary: (MyChart) Due to this being a telephonic visit, the after visit summary with patients personalized plan was offered to patient via MyChart   Notes: See routing comments

## 2024-04-30 ENCOUNTER — Other Ambulatory Visit: Payer: Self-pay

## 2024-05-04 ENCOUNTER — Other Ambulatory Visit: Payer: Self-pay

## 2024-05-04 NOTE — Progress Notes (Signed)
 Specialty Pharmacy Refill Coordination Note  Edwin Webster is a 63 y.o. male contacted today regarding refills of specialty medication(s) Ibrutinib  (IMBRUVICA )   Patient requested Delivery   Delivery date: 05/05/24   Verified address: [address]  [place] [address]   Medication will be filled on 06.09.25.

## 2024-05-07 ENCOUNTER — Ambulatory Visit (INDEPENDENT_AMBULATORY_CARE_PROVIDER_SITE_OTHER): Payer: Medicare Other

## 2024-05-07 DIAGNOSIS — I495 Sick sinus syndrome: Secondary | ICD-10-CM

## 2024-05-07 LAB — CUP PACEART REMOTE DEVICE CHECK
Battery Remaining Longevity: 84 mo
Battery Remaining Percentage: 69 %
Battery Voltage: 2.99 V
Brady Statistic AP VP Percent: 1 %
Brady Statistic AP VS Percent: 73 %
Brady Statistic AS VP Percent: 1 %
Brady Statistic AS VS Percent: 27 %
Brady Statistic RA Percent Paced: 73 %
Brady Statistic RV Percent Paced: 1 %
Date Time Interrogation Session: 20250612030801
Implantable Lead Connection Status: 753985
Implantable Lead Connection Status: 753985
Implantable Lead Implant Date: 20210915
Implantable Lead Implant Date: 20210915
Implantable Lead Location: 753859
Implantable Lead Location: 753860
Implantable Pulse Generator Implant Date: 20210915
Lead Channel Impedance Value: 400 Ohm
Lead Channel Impedance Value: 550 Ohm
Lead Channel Pacing Threshold Amplitude: 0.5 V
Lead Channel Pacing Threshold Amplitude: 0.625 V
Lead Channel Pacing Threshold Pulse Width: 0.5 ms
Lead Channel Pacing Threshold Pulse Width: 0.5 ms
Lead Channel Sensing Intrinsic Amplitude: 2.3 mV
Lead Channel Sensing Intrinsic Amplitude: 8.5 mV
Lead Channel Setting Pacing Amplitude: 0.875
Lead Channel Setting Pacing Amplitude: 1.5 V
Lead Channel Setting Pacing Pulse Width: 0.5 ms
Lead Channel Setting Sensing Sensitivity: 2 mV
Pulse Gen Model: 2272
Pulse Gen Serial Number: 3864545

## 2024-05-10 ENCOUNTER — Emergency Department (HOSPITAL_BASED_OUTPATIENT_CLINIC_OR_DEPARTMENT_OTHER)
Admission: EM | Admit: 2024-05-10 | Discharge: 2024-05-10 | Disposition: A | Discharge status: Home / Self Care | Attending: Emergency Medicine | Admitting: Emergency Medicine

## 2024-05-10 ENCOUNTER — Emergency Department (HOSPITAL_BASED_OUTPATIENT_CLINIC_OR_DEPARTMENT_OTHER)

## 2024-05-10 ENCOUNTER — Other Ambulatory Visit: Payer: Self-pay

## 2024-05-10 ENCOUNTER — Encounter (HOSPITAL_BASED_OUTPATIENT_CLINIC_OR_DEPARTMENT_OTHER): Payer: Self-pay | Admitting: *Deleted

## 2024-05-10 DIAGNOSIS — Z7901 Long term (current) use of anticoagulants: Secondary | ICD-10-CM | POA: Insufficient documentation

## 2024-05-10 DIAGNOSIS — R109 Unspecified abdominal pain: Secondary | ICD-10-CM | POA: Diagnosis present

## 2024-05-10 DIAGNOSIS — N132 Hydronephrosis with renal and ureteral calculous obstruction: Secondary | ICD-10-CM | POA: Diagnosis not present

## 2024-05-10 DIAGNOSIS — R7989 Other specified abnormal findings of blood chemistry: Secondary | ICD-10-CM | POA: Insufficient documentation

## 2024-05-10 DIAGNOSIS — Z79899 Other long term (current) drug therapy: Secondary | ICD-10-CM | POA: Diagnosis not present

## 2024-05-10 DIAGNOSIS — F17200 Nicotine dependence, unspecified, uncomplicated: Secondary | ICD-10-CM | POA: Insufficient documentation

## 2024-05-10 DIAGNOSIS — J449 Chronic obstructive pulmonary disease, unspecified: Secondary | ICD-10-CM | POA: Insufficient documentation

## 2024-05-10 DIAGNOSIS — I10 Essential (primary) hypertension: Secondary | ICD-10-CM | POA: Insufficient documentation

## 2024-05-10 DIAGNOSIS — Z7951 Long term (current) use of inhaled steroids: Secondary | ICD-10-CM | POA: Insufficient documentation

## 2024-05-10 DIAGNOSIS — N179 Acute kidney failure, unspecified: Secondary | ICD-10-CM

## 2024-05-10 LAB — URINALYSIS, ROUTINE W REFLEX MICROSCOPIC
Glucose, UA: NEGATIVE mg/dL
Ketones, ur: NEGATIVE mg/dL
Leukocytes,Ua: NEGATIVE
Nitrite: NEGATIVE
Protein, ur: >=300 mg/dL — AB
Specific Gravity, Urine: 1.025 (ref 1.005–1.030)
pH: 5.5 (ref 5.0–8.0)

## 2024-05-10 LAB — BASIC METABOLIC PANEL WITH GFR
Anion gap: 14 (ref 5–15)
BUN: 16 mg/dL (ref 8–23)
CO2: 24 mmol/L (ref 22–32)
Calcium: 9.3 mg/dL (ref 8.9–10.3)
Chloride: 103 mmol/L (ref 98–111)
Creatinine, Ser: 1.43 mg/dL — ABNORMAL HIGH (ref 0.61–1.24)
GFR, Estimated: 54 mL/min — ABNORMAL LOW
Glucose, Bld: 105 mg/dL — ABNORMAL HIGH (ref 70–99)
Potassium: 3.4 mmol/L — ABNORMAL LOW (ref 3.5–5.1)
Sodium: 141 mmol/L (ref 135–145)

## 2024-05-10 LAB — URINALYSIS, MICROSCOPIC (REFLEX): WBC, UA: NONE SEEN WBC/hpf (ref 0–5)

## 2024-05-10 LAB — CBC
HCT: 38.2 % — ABNORMAL LOW (ref 39.0–52.0)
Hemoglobin: 13 g/dL (ref 13.0–17.0)
MCH: 31.9 pg (ref 26.0–34.0)
MCHC: 34 g/dL (ref 30.0–36.0)
MCV: 93.9 fL (ref 80.0–100.0)
Platelets: 245 10*3/uL (ref 150–400)
RBC: 4.07 MIL/uL — ABNORMAL LOW (ref 4.22–5.81)
RDW: 13.7 % (ref 11.5–15.5)
WBC: 4.1 10*3/uL (ref 4.0–10.5)
nRBC: 0 % (ref 0.0–0.2)

## 2024-05-10 MED ORDER — OXYCODONE-ACETAMINOPHEN 5-325 MG PO TABS: 1.0000 | ORAL_TABLET | Freq: Four times a day (QID) | ORAL | 0 refills | Status: DC | PRN

## 2024-05-10 MED ORDER — ONDANSETRON HCL 4 MG/2ML IJ SOLN
4.0000 mg | Freq: Once | INTRAMUSCULAR | Status: AC
  Administered 2024-05-10: 4 mg via INTRAVENOUS
  Filled 2024-05-10: qty 2

## 2024-05-10 MED ORDER — SODIUM CHLORIDE 0.9 % IV BOLUS
1000.0000 mL | Freq: Once | INTRAVENOUS | Status: AC
  Administered 2024-05-10: 1000 mL via INTRAVENOUS

## 2024-05-10 MED ORDER — KETOROLAC TROMETHAMINE 30 MG/ML IJ SOLN
15.0000 mg | Freq: Once | INTRAMUSCULAR | Status: AC
Start: 2024-05-10 — End: 2024-05-10
  Administered 2024-05-10: 15 mg via INTRAVENOUS
  Filled 2024-05-10: qty 1

## 2024-05-10 MED ORDER — HYDROMORPHONE HCL 1 MG/ML IJ SOLN
1.0000 mg | Freq: Once | INTRAMUSCULAR | Status: AC
  Administered 2024-05-10: 1 mg via INTRAVENOUS
  Filled 2024-05-10: qty 1

## 2024-05-10 NOTE — Progress Notes (Signed)
 Chief Complaint: No chief complaint on file.   History of Present Illness:  Edwin Webster is a 63 y.o. male who is seen in consultation from Trenton Frock, PA-C for evaluation of ***.   Past Medical History:  Past Medical History:  Diagnosis Date   Allergy    seasonal allergies   Anemia    Anxiety    on meds   Arthritis    Chest pain, non-cardiac    COPD (chronic obstructive pulmonary disease) (HCC)    past smoker   Counseling regarding goals of care 03/27/2018   Depression    on meds   Diverticulitis    Diverticulosis    Fractured pelvis (HCC)    fall from ladder   GERD (gastroesophageal reflux disease)    on meds   Gout    History of shingles    Hyperlipidemia    on meds   Hypertension    on meds   Internal hemorrhoids    Iron  deficiency anemia 03/13/2018   Kidney stones    Osteoporosis    Other abnormal glucose 10/11/2015   Presence of permanent cardiac pacemaker    Rheumatoid arthritis (HCC)    hands   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    Past Surgical History:  Past Surgical History:  Procedure Laterality Date   COLONOSCOPY  2016   JMP-suprep(exc)-mild TICS/int hems/normal-5 yr recall   FINGER SURGERY Right    ring finger   iron  infusion     KNEE SURGERY     right   KYPHOPLASTY Bilateral 10/10/2023   Procedure: KYPHOPLASTY Thoracic eight;  Surgeon: Augusto Blonder, MD;  Location[facility] OR;  Service: Neurosurgery;  Laterality: Bilateral;   PACEMAKER IMPLANT N/A 08/10/2020   Procedure: PACEMAKER IMPLANT;  Surgeon: Lei Pump, MD;  Location: [facility] INVASIVE CV LAB;  Service: Cardiovascular;  Laterality: N/A;   ROTATOR CUFF REPAIR  1996   left   SPINE SURGERY     WISDOM TOOTH EXTRACTION      Allergies:  Allergies  Allergen Reactions   Adalimumab Other (See Comments)    blacked out, confused     Family History:  Family History  Problem Relation Age of Onset   Hyperlipidemia Mother    COPD Mother    Heart failure  Mother    Colon polyps Mother    Breast cancer Mother    Rectal cancer Mother    Hyperlipidemia Brother    Colon polyps Brother    Stroke Maternal Grandfather    COPD Maternal Grandfather        colon   Colon cancer Maternal Grandfather 13   Colon polyps Maternal Grandfather 45   Hypertension Father    Colon polyps Daughter 44       lynch syndrome   ADD / ADHD Daughter    Esophageal cancer Neg Hx    Stomach cancer Neg Hx     Social History:  Social History   Tobacco Use   Smoking status: Former    Current packs/day: 0.00    Average packs/day: 2.0 packs/day for 35.6 years (71.2 ttl pk-yrs)    Types: Cigarettes    Start date: 30    Quit date: 07/03/2010    Years since quitting: 13.8   Smokeless tobacco: Never  Vaping Use   Vaping status: Never Used  Substance Use Topics   Alcohol use: No    Alcohol/week: 0.0 standard drinks of alcohol   Drug use: No    Review of symptoms:  Constitutional:  Negative for unexplained weight loss, night sweats, fever, chills ENT:  Negative for nose bleeds, sinus pain, painful swallowing CV:  Negative for chest pain, shortness of breath, exercise intolerance, palpitations, loss of consciousness Resp:  Negative for cough, wheezing, shortness of breath GI:  Negative for nausea, vomiting, diarrhea, bloody stools GU:  Positives noted in HPI; otherwise negative for gross hematuria, dysuria, urinary incontinence Neuro:  Negative for seizures, poor balance, limb weakness, slurred speech Psych:  Negative for lack of energy, depression, anxiety Endocrine:  Negative for polydipsia, polyuria, symptoms of hypoglycemia (dizziness, hunger, sweating) Hematologic:  Negative for anemia, purpura, petechia, prolonged or excessive bleeding, use of anticoagulants  Allergic:  Negative for difficulty breathing or choking as a result of exposure to anything; no shellfish allergy; no allergic response (rash/itch) to materials, foods  Physical exam: There were no  vitals taken for this visit. GENERAL APPEARANCE:  Well appearing, well developed, well nourished, NAD HEENT: Atraumatic, Normocephalic. NECK: Normal appearance LUNGS: Normal inspiratory and expiratory excursion HEART: Regular Rate ABDOMEN: ***. GU: Phallus normal, no lesions. Scrotal skin normal. Testicles/epididymal structures normal. Meatus normal. Normal anal sphincter tone, prostate ***mL, symmetric, non nodular, non tender. EXTREMITIES: Moves all extremities well.  Without clubbing, cyanosis, or edema. NEUROLOGIC:  Alert and oriented x 3, normal gait, CN II-XII grossly intact.  MENTAL STATUS:  Appropriate. SKIN:  Warm, dry and intact.    Results: Results for orders placed or performed during the hospital encounter of 05/10/24 (from the past 24 hours)  CBC   Collection Time: 05/10/24  1:32 PM  Result Value Ref Range   WBC 4.1 4.0 - 10.5 K/uL   RBC 4.07 (L) 4.22 - 5.81 MIL/uL   Hemoglobin 13.0 13.0 - 17.0 g/dL   HCT 16.1 (L) 09.6 - 04.5 %   MCV 93.9 80.0 - 100.0 fL   MCH 31.9 26.0 - 34.0 pg   MCHC 34.0 30.0 - 36.0 g/dL   RDW 40.9 81.1 - 91.4 %   Platelets 245 150 - 400 K/uL   nRBC 0.0 0.0 - 0.2 %  Basic metabolic panel   Collection Time: 05/10/24  1:32 PM  Result Value Ref Range   Sodium 141 135 - 145 mmol/L   Potassium 3.4 (L) 3.5 - 5.1 mmol/L   Chloride 103 98 - 111 mmol/L   CO2 24 22 - 32 mmol/L   Glucose, Bld 105 (H) 70 - 99 mg/dL   BUN 16 8 - 23 mg/dL   Creatinine, Ser 7.82 (H) 0.61 - 1.24 mg/dL   Calcium  9.3 8.9 - 10.3 mg/dL   GFR, Estimated 54 (L) >60 mL/min   Anion gap 14 5 - 15    I have reviewed referring/prior physicians notes  I have reviewed urinalysis  I have reviewed PSA results  I have reviewed prior imaging  I have reviewed urine culture results  Assessment: ***   Plan: ***

## 2024-05-10 NOTE — ED Triage Notes (Signed)
 Pt was seen at [facility] on Thursday and was dx with a kidney stone.  Pt was d/c with flowmax, pain med and follow up with urology (appointment is tomorrow).  Pt continues to have pain in left lower groin area and states that today when he had to urinate it was bloody.  Pt is on xarelto .

## 2024-05-10 NOTE — ED Provider Notes (Signed)
  Physical Exam  BP (!) 178/92 (BP Location: Left Arm)   Pulse 78   Temp 98 F (36.7 C)   Resp 17   SpO2 97%   Physical Exam  Procedures  Procedures  ED Course / MDM    Medical Decision Making Care assumed at 3 PM.  Patient is here with flank pain and hematuria.  Labs showed mild AKI and urinalysis showed hematuria with no infection.  Signout pending reassessment after pain medicine  3:53 PM I reassessed patient and he states that he is feeling better.  Patient received IV fluids for mild AKI.  He has appointment with urology tomorrow.  Will switch his pain medicine from hydrocodone  to oxycodone .  Gave strict return precautions  Problems Addressed: Ureteral stone with hydronephrosis: acute illness or injury  Amount and/or Complexity of Data Reviewed Labs: ordered. Decision-making details documented in ED Course. Radiology: ordered and independent interpretation performed. Decision-making details documented in ED Course.  Risk Prescription drug management.          Dalene Duck, MD 05/10/24 [phone]

## 2024-05-10 NOTE — ED Notes (Signed)
Urine cup given and advised that urine sample is needed

## 2024-05-10 NOTE — ED Provider Notes (Addendum)
 [place] EMERGENCY DEPARTMENT AT [facility] Provider Note   CSN: [id number] Arrival date & time: 05/10/24  1319     Patient presents with: Nephrolithiasis and Hematuria   Edwin Webster is a 63 y.o. male.    Hematuria  Patient presents with hematuria and flank pain.  Diagnosed with kidney stone at [facility] on 05/25/2024.  Has passed previous kidney stones.  States those were not as bad as this 1.  Patient's pain got severe.  Had some chills but thinks it may have been due to the pain.  Is on hydrocodone  and Flomax .  Pain overall controlled.  Creatinine was 1.2 3 days ago.  Hemoglobin 12.6 3 days ago.  CT scan result UROLOGIC: - Kidneys/ureters: Approximately 5 mm linear ureteral calculus at the level of the left UVJ with upstream mild hydroureteronephrosis.Aaron Aas - Urinary bladder:Normal.  Patient now states he has more blood.  Is on anticoagulation for A-fib.     Past Medical History:  Diagnosis Date   Allergy    seasonal allergies   Anemia    Anxiety    on meds   Arthritis    Chest pain, non-cardiac    COPD (chronic obstructive pulmonary disease) (HCC)    past smoker   Counseling regarding goals of care 03/27/2018   Depression    on meds   Diverticulitis    Diverticulosis    Fractured pelvis (HCC)    fall from ladder   GERD (gastroesophageal reflux disease)    on meds   Gout    History of shingles    Hyperlipidemia    on meds   Hypertension    on meds   Internal hemorrhoids    Iron  deficiency anemia 03/13/2018   Kidney stones    Osteoporosis    Other abnormal glucose 10/11/2015   Presence of permanent cardiac pacemaker    Rheumatoid arthritis (HCC)    hands   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 Authorizing Provider  acetaminophen  (TYLENOL ) 650 MG CR tablet Take 650 mg by mouth as needed for pain.    [provider]  acyclovir  (ZOVIRAX )  200 MG capsule TAKE 1 CAPSULE BY MOUTH TWICE A DAY 12/23/23   Ivor Mars, MD  alendronate  (FOSAMAX ) 70 MG tablet Take 1 tablet (70 mg total) by mouth every 7 (seven) days. Take with a full glass of water on an empty stomach and avoid reclining or lying down for 2 hours after taking this medication. 08/19/23 08/18/24  Wilkinson, Dana E, FNP  baclofen  (LIORESAL ) 10 MG tablet TAKE 1 /2 TO 1 TABLET 2 X /DAY IF NEEDED FOR MUSCLE SPASM Patient taking differently: No sig reported 08/22/22   Wilkinson, Dana E, FNP  betamethasone  dipropionate (DIPROLENE ) 0.05 % ointment Apply topically 2 (two) times daily. Patient taking differently: Apply 1 application  topically daily as needed (Itching). 05/31/23   Tonya Fredrickson, MD  bisoprolol  (ZEBETA ) 10 MG tablet Take   1 tablet  every Morning  for BP 03/10/24   Drubel, Heidi Llamas, PA-C  busPIRone  (BUSPAR ) 7.5 MG tablet TAKE 1 TABLET 3 X /DAY FOR CHRONIC ANXIETY Patient taking differently: as needed. 06/01/23   Vangie Genet, MD  Cholecalciferol (VITAMIN D -3) 5000 UNITS TABS Take 5,000 Units by mouth daily.    [provider]  escitalopram  (LEXAPRO ) 20 MG tablet Take 20 mg by mouth daily.    [provider]  ezetimibe  (ZETIA )  10 MG tablet TAKE 1 TABLET (10 MG TOTAL) BY MOUTH DAILY. PLEASE KEEP SCHEDULED APPOINTMENT 12/03/23   Camnitz, Babetta Lesch, MD  folic acid  (FOLVITE ) 1 MG tablet TAKE 1 TABLET DAILY FOR FOLATE DEFICIENCY 06/03/23   Wilkinson, Dana E, FNP  ibrutinib  (IMBRUVICA ) 280 MG tablet Take 1 tablet (280 mg total) by mouth daily. Take with a glass of water. 12/04/23   Ivor Mars, MD  ipratropium (ATROVENT ) 0.06 % nasal spray Use 1 to 2 sprays each nostril 2 to 3 x /day as needed 02/28/21   Vangie Genet, MD  levocetirizine (XYZAL ) 5 MG tablet TAKE 1 TABLET BY MOUTH DAILY FOR ALLERGIES 11/28/23   Wilkinson, Dana E, FNP  olmesartan  (BENICAR ) 40 MG tablet Take 1 tablet (40 mg total) by mouth at bedtime. For BP 06/03/23   Langley Pippin, NP   rivaroxaban  (XARELTO ) 15 MG TABS tablet Take 1 tablet (15 mg total) by mouth daily with supper. 02/27/24   Nathanel Bal, PA-C  rosuvastatin  (CRESTOR ) 40 MG tablet TAKE 1 TABLET BY MOUTH EVERY DAY FOR CHOLESTEROL 11/26/23   Wilkinson, Dana E, FNP  triamcinolone  cream (KENALOG ) 0.1 % Apply  to skin  Rash   2 x /day   as needed 03/20/23   Vangie Genet, MD    Allergies: Adalimumab    Review of Systems  Genitourinary:  Positive for hematuria.    Updated Vital Signs BP (!) 178/92 (BP Location: Left Arm)   Pulse 78   Temp 98 F (36.7 C)   Resp 17   SpO2 97%   Physical Exam Vitals and nursing note reviewed.   Cardiovascular:     Rate and Rhythm: Normal rate.  Chest:     Chest wall: No tenderness.  Abdominal:     Tenderness: There is no abdominal tenderness.   Skin:    General: Skin is warm.   Neurological:     Mental Status: He is alert and oriented to person, place, and time.     (all labs ordered are listed, but only abnormal results are displayed) Labs Reviewed  URINALYSIS, ROUTINE W REFLEX MICROSCOPIC - Abnormal; Notable for the following components:      Result Value   Color, Urine [name] (*)    APPearance CLOUDY (*)    Hgb urine dipstick LARGE (*)    Bilirubin Urine SMALL (*)    Protein, ur >=300 (*)    All other components within normal limits  CBC - Abnormal; Notable for the following components:   RBC 4.07 (*)    HCT 38.2 (*)    All other components within normal limits  BASIC METABOLIC PANEL WITH GFR - Abnormal; Notable for the following components:   Potassium 3.4 (*)    Glucose, Bld 105 (*)    Creatinine, Ser 1.43 (*)    GFR, Estimated 54 (*)    All other components within normal limits  URINALYSIS, MICROSCOPIC (REFLEX) - Abnormal; Notable for the following components:   Bacteria, UA RARE (*)    All other components within normal limits    EKG: None  Radiology: DG Abdomen 1 View Result Date: 05/10/2024 CLINICAL DATA:  Kidney stone.  Pain in  left groin. EXAM: ABDOMEN - 1 VIEW COMPARISON:  02/19/2018, 04/25/2022. FINDINGS: The bowel gas pattern is normal. A moderate amount of stool is present in the colon. No renal or ureteral calculus is seen, which may be due to overlying bowel contents. Vascular calcifications are noted in the pelvis and bilateral lower extremities.  IMPRESSION: 1. No renal or ureteral calculus is seen, however examination is limited due to overlying bowel contents. 2. Moderate amount of retained stool in the colon. Electronically Signed   By: Wyvonnia Heimlich M.D.   On: 05/10/2024 14:42     Procedures   Medications Ordered in the ED  HYDROmorphone (DILAUDID) injection 1 mg (has no administration in time range)  ondansetron  (ZOFRAN ) injection 4 mg (has no administration in time range)  ketorolac (TORADOL) 30 MG/ML injection 15 mg (has no administration in time range)                                    Medical Decision Making Amount and/or Complexity of Data Reviewed Labs: ordered. Radiology: ordered.  Risk Prescription drug management.   Patient with known left-sided kidney stone.  Now hematuria.  Pain had been somewhat uncontrolled with hydrocodone  and Flomax .  Has also had Phenergan  for vomiting and states he still been vomiting.  Will treat symptomatically.  Blood work overall reassuring.  White count reassuring with normal hemoglobin.  Creatinine is slightly increased from prior.  Creatinine 1.2 previously now 1.4.  Will get urinalysis and get KUB to evaluate for stone location.  States has follow-up tomorrow with urology in this building.  KUB did not show stone.  However continued pain.  Will not give the treatment.  Care turned over Dr. Delana Favors   Postvoid residual 162.     Final diagnoses:  Ureteral stone with hydronephrosis    ED Discharge Orders     None          Mozell Arias, MD 05/10/24 1455    Mozell Arias, MD 05/10/24 [phone]

## 2024-05-10 NOTE — Discharge Instructions (Addendum)
 Your kidney function is slightly elevated due to your kidney stone  Please stay hydrated  I have switched you to Percocet from hydrocodone  to help control your pain  Please follow-up with urology tomorrow to discuss options  Hold off on your blood thinner tonight  Return to ER if you have severe abdominal pain or vomiting or large clots in your urine

## 2024-05-10 NOTE — ED Notes (Signed)
 bladder scan

## 2024-05-10 NOTE — ED Notes (Signed)
 Reviewed discharge instructions, follow up and pain management with pt. Pt states understanding. Pt comfortable at this time.Will be discharged and accompanied by family

## 2024-05-11 ENCOUNTER — Ambulatory Visit (INDEPENDENT_AMBULATORY_CARE_PROVIDER_SITE_OTHER): Admitting: Urology

## 2024-05-11 ENCOUNTER — Encounter: Payer: Self-pay | Admitting: Family

## 2024-05-11 VITALS — BP 159/88 | HR 60 | Ht 72.0 in | Wt 165.0 lb

## 2024-05-11 DIAGNOSIS — Z87442 Personal history of urinary calculi: Secondary | ICD-10-CM | POA: Diagnosis not present

## 2024-05-11 DIAGNOSIS — Z09 Encounter for follow-up examination after completed treatment for conditions other than malignant neoplasm: Secondary | ICD-10-CM

## 2024-05-11 DIAGNOSIS — N201 Calculus of ureter: Secondary | ICD-10-CM

## 2024-05-11 LAB — MICROSCOPIC EXAMINATION

## 2024-05-11 LAB — URINALYSIS, ROUTINE W REFLEX MICROSCOPIC
Bilirubin, UA: NEGATIVE
Glucose, UA: NEGATIVE
Ketones, UA: NEGATIVE
Leukocytes,UA: NEGATIVE
Nitrite, UA: NEGATIVE
Protein,UA: NEGATIVE
Specific Gravity, UA: 1.01 (ref 1.005–1.030)
Urobilinogen, Ur: 0.2 mg/dL (ref 0.2–1.0)
pH, UA: 5.5 (ref 5.0–7.5)

## 2024-05-12 ENCOUNTER — Ambulatory Visit: Payer: Self-pay | Admitting: Cardiology

## 2024-05-13 ENCOUNTER — Ambulatory Visit: Payer: Medicare HMO | Admitting: Internal Medicine

## 2024-05-14 ENCOUNTER — Other Ambulatory Visit: Payer: Self-pay

## 2024-05-14 NOTE — Progress Notes (Signed)
 Specialty Pharmacy Ongoing Clinical Assessment Note  Edwin Webster is a 63 y.o. male who is being followed by the specialty pharmacy service for RxSp Oncology   Patient's specialty medication(s) reviewed today: Ibrutinib  (IMBRUVICA )   Missed doses in the last 4 weeks: 0   Patient/Caregiver did not have any additional questions or concerns.   Therapeutic benefit summary: Patient is achieving benefit   Adverse events/side effects summary: No adverse events/side effects   Patient's therapy is appropriate to: Continue    Goals Addressed             This Visit's Progress    Slow Disease Progression   On track    Patient is on track. Patient will maintain adherence. Per labs on 03/24/24, Waldenstrom's level is down to 0.3 and IgM is 414. Patient reported that Dr. Maria Shiner told him he was in remission.         Follow up: 3 months  [facility]

## 2024-05-18 ENCOUNTER — Other Ambulatory Visit: Payer: Self-pay | Admitting: Urology

## 2024-05-22 LAB — LITHOLINK 24HR URINE PANEL
Ammonium, Urine: 28 mmol/(24.h) (ref 15–60)
Calcium Oxalate Saturation: 9.48 (ref 6.00–10.00)
Calcium Phosphate Saturation: 0.36 — ABNORMAL LOW (ref 0.50–2.00)
Calcium, Urine: 112 mg/(24.h) (ref <250)
Calcium/Creatinine Ratio: 74 mg/g{creat} (ref 34–196)
Calcium/Kg Body Weight: 1.5 mg/kg/d (ref <4.0)
Chloride, Urine: 74 mmol/(24.h) (ref 70–250)
Citrate, Urine: 622 mg/(24.h) (ref >450)
Creatinine, Urine: 1516 mg/(24.h)
Creatinine/Kg Body Weight: 20.9 mg/kg/d (ref 11.9–24.4)
Cystine, Urine, Qualitative: NEGATIVE
Magnesium, Urine: 58 mg/(24.h) (ref 30–120)
Oxalate, Urine: 26 mg/(24.h) (ref 20–40)
Phosphorus, Urine: 615 mg/(24.h) (ref 600–1200)
Potassium, Urine: 36 mmol/(24.h) (ref 20–100)
Protein Catabolic Rate: 0.8 g/kg/d (ref 0.8–1.4)
Sodium, Urine: 76 mmol/(24.h) (ref 50–150)
Sulfate, Urine: 34 meq/(24.h) (ref 20–80)
Urea Nitrogen, Urine: 6.77 g/(24.h) (ref 6.00–14.00)
Uric Acid Saturation: 2.7 — ABNORMAL HIGH (ref <1.00)
Uric Acid, Urine: 422 mg/(24.h) (ref <800)
Urine Volume (Preserved): 850 mL/(24.h) (ref 500–4000)
pH, 24 hr, Urine: 5.354 — ABNORMAL LOW (ref 5.800–6.200)

## 2024-05-25 ENCOUNTER — Telehealth: Payer: Self-pay | Admitting: Urology

## 2024-05-25 NOTE — Telephone Encounter (Signed)
 Pt called and lvm about side pain thinking he might have another kidney stone. Called pt back and lvm 05/25/24 ANN.

## 2024-05-27 ENCOUNTER — Ambulatory Visit: Payer: Self-pay | Admitting: Urology

## 2024-05-28 ENCOUNTER — Other Ambulatory Visit (HOSPITAL_COMMUNITY): Payer: Self-pay

## 2024-05-28 ENCOUNTER — Other Ambulatory Visit: Payer: Self-pay

## 2024-05-28 NOTE — Progress Notes (Signed)
 Specialty Pharmacy Refill Coordination Note  Spoke with Edwin Webster is a 63 y.o. male contacted today regarding refills of specialty medication(s) Ibrutinib  (IMBRUVICA )  Patient requested: Delivery   Delivery date: 06/02/24   Verified address: [address]  [place] [address]  Medication will be filled on 06/01/24.

## 2024-06-01 ENCOUNTER — Telehealth: Payer: Self-pay

## 2024-06-01 NOTE — Telephone Encounter (Signed)
 Called pt lvm stating MD Dahlstedt wanted to ley him know : Overall, the 24-hour urine looks fine.  The big abnormality is that your urine volume was only 850 mL for the day.  That is why your uric acid saturation is higher.  By drinking more water, that we will decrease that value and subsequently decrease the risk of further stones.  Overall that looks great otherwise, I do not think you need any further follow-up here.

## 2024-06-04 ENCOUNTER — Telehealth: Payer: Self-pay

## 2024-06-04 ENCOUNTER — Other Ambulatory Visit: Payer: Self-pay | Admitting: Hematology & Oncology

## 2024-06-04 NOTE — Telephone Encounter (Signed)
 Patient was made of Dr. Matilda recommendation and voiced understanding. Overall, the 24-hour urine looks fine.  The big abnormality is that your urine volume was only 850 mL for the day.  That is why your uric acid saturation is higher.  By drinking more water, that we will decrease that value and subsequently decrease the risk of further stones.  Overall that looks great otherwise, I do not think you need any further follow-up here.

## 2024-06-19 ENCOUNTER — Other Ambulatory Visit: Payer: Self-pay

## 2024-06-23 ENCOUNTER — Inpatient Hospital Stay: Attending: Hematology & Oncology

## 2024-06-23 ENCOUNTER — Inpatient Hospital Stay (HOSPITAL_BASED_OUTPATIENT_CLINIC_OR_DEPARTMENT_OTHER): Admitting: Hematology & Oncology

## 2024-06-23 ENCOUNTER — Encounter: Payer: Self-pay | Admitting: Hematology & Oncology

## 2024-06-23 ENCOUNTER — Ambulatory Visit: Payer: Self-pay | Admitting: Hematology & Oncology

## 2024-06-23 VITALS — BP 148/75 | HR 59 | Temp 97.9°F | Resp 19 | Ht 72.0 in | Wt 157.0 lb

## 2024-06-23 DIAGNOSIS — C8800 Waldenstrom macroglobulinemia not having achieved remission: Secondary | ICD-10-CM | POA: Insufficient documentation

## 2024-06-23 DIAGNOSIS — D509 Iron deficiency anemia, unspecified: Secondary | ICD-10-CM | POA: Diagnosis not present

## 2024-06-23 DIAGNOSIS — D500 Iron deficiency anemia secondary to blood loss (chronic): Secondary | ICD-10-CM | POA: Diagnosis not present

## 2024-06-23 LAB — CBC WITH DIFFERENTIAL (CANCER CENTER ONLY)
Abs Immature Granulocytes: 0.02 K/uL (ref 0.00–0.07)
Basophils Absolute: 0 K/uL (ref 0.0–0.1)
Basophils Relative: 1 %
Eosinophils Absolute: 0.1 K/uL (ref 0.0–0.5)
Eosinophils Relative: 2 %
HCT: 35.3 % — ABNORMAL LOW (ref 39.0–52.0)
Hemoglobin: 11.9 g/dL — ABNORMAL LOW (ref 13.0–17.0)
Immature Granulocytes: 1 %
Lymphocytes Relative: 21 %
Lymphs Abs: 0.9 K/uL (ref 0.7–4.0)
MCH: 31.6 pg (ref 26.0–34.0)
MCHC: 33.7 g/dL (ref 30.0–36.0)
MCV: 93.9 fL (ref 80.0–100.0)
Monocytes Absolute: 0.5 K/uL (ref 0.1–1.0)
Monocytes Relative: 11 %
Neutro Abs: 2.8 K/uL (ref 1.7–7.7)
Neutrophils Relative %: 64 %
Platelet Count: 208 K/uL (ref 150–400)
RBC: 3.76 MIL/uL — ABNORMAL LOW (ref 4.22–5.81)
RDW: 13.7 % (ref 11.5–15.5)
WBC Count: 4.3 K/uL (ref 4.0–10.5)
nRBC: 0 % (ref 0.0–0.2)

## 2024-06-23 LAB — CMP (CANCER CENTER ONLY)
ALT: 7 U/L (ref 0–44)
AST: 21 U/L (ref 15–41)
Albumin: 4.4 g/dL (ref 3.5–5.0)
Alkaline Phosphatase: 81 U/L (ref 38–126)
Anion gap: 11 (ref 5–15)
BUN: 14 mg/dL (ref 8–23)
CO2: 23 mmol/L (ref 22–32)
Calcium: 9.3 mg/dL (ref 8.9–10.3)
Chloride: 105 mmol/L (ref 98–111)
Creatinine: 1.27 mg/dL — ABNORMAL HIGH (ref 0.61–1.24)
GFR, Estimated: >60 mL/min (ref >60)
Glucose, Bld: 90 mg/dL (ref 70–99)
Potassium: 3.7 mmol/L (ref 3.5–5.1)
Sodium: 139 mmol/L (ref 135–145)
Total Bilirubin: 0.5 mg/dL (ref 0.0–1.2)
Total Protein: 6.5 g/dL (ref 6.5–8.1)

## 2024-06-23 LAB — FERRITIN: Ferritin: 30 ng/mL (ref 24–336)

## 2024-06-23 LAB — LACTATE DEHYDROGENASE: LDH: 209 U/L — ABNORMAL HIGH (ref 98–192)

## 2024-06-23 LAB — IRON AND IRON BINDING CAPACITY (CC-WL,HP ONLY)
Iron: 53 ug/dL (ref 45–182)
Saturation Ratios: 13 % — ABNORMAL LOW (ref 17.9–39.5)
TIBC: 400 ug/dL (ref 250–450)
UIBC: 347 ug/dL

## 2024-06-23 NOTE — Progress Notes (Unsigned)
      Established patient visit   Patient: Edwin Webster   DOB: 20-Oct-1961   63 y.o. Male  MRN: [id number] Visit Date: 06/24/2024  Today's healthcare provider: Manuelita Flatness, PA-C   No chief complaint on file.  Subjective     ***  Medications: Outpatient Medications Prior to Visit  Medication Sig   acetaminophen  (TYLENOL ) 650 MG CR tablet Take 650 mg by mouth as needed for pain.   acyclovir  (ZOVIRAX ) 200 MG capsule TAKE 1 CAPSULE BY MOUTH TWICE A DAY   alendronate  (FOSAMAX ) 70 MG tablet Take 1 tablet (70 mg total) by mouth every 7 (seven) days. Take with a full glass of water on an empty stomach and avoid reclining or lying down for 2 hours after taking this medication.   bisoprolol  (ZEBETA ) 10 MG tablet Take   1 tablet  every Morning  for BP   Cholecalciferol (VITAMIN D -3) 5000 UNITS TABS Take 5,000 Units by mouth daily.   escitalopram  (LEXAPRO ) 20 MG tablet Take 20 mg by mouth daily.   ezetimibe  (ZETIA ) 10 MG tablet TAKE 1 TABLET (10 MG TOTAL) BY MOUTH DAILY. PLEASE KEEP SCHEDULED APPOINTMENT   folic acid  (FOLVITE ) 1 MG tablet TAKE 1 TABLET DAILY FOR FOLATE DEFICIENCY   ibrutinib  (IMBRUVICA ) 280 MG tablet Take 1 tablet (280 mg total) by mouth daily. Take with a glass of water.   levocetirizine (XYZAL ) 5 MG tablet TAKE 1 TABLET BY MOUTH DAILY FOR ALLERGIES   olmesartan  (BENICAR ) 40 MG tablet Take 1 tablet (40 mg total) by mouth at bedtime. For BP   oxyCODONE -acetaminophen  (PERCOCET) 5-325 MG tablet Take 1 tablet by mouth every 6 (six) hours as needed.   rivaroxaban  (XARELTO ) 15 MG TABS tablet Take 1 tablet (15 mg total) by mouth daily with supper.   rosuvastatin  (CRESTOR ) 40 MG tablet TAKE 1 TABLET BY MOUTH EVERY DAY FOR CHOLESTEROL   No facility-administered medications prior to visit.    Review of Systems {Insert previous labs (optional):23779} {See past labs  Heme  Chem  Endocrine  Serology  Results Review (optional):1}   Objective    There were no vitals taken for  this visit. {Insert last BP/Wt (optional):23777}{See vitals history (optional):1}  Physical Exam  ***  No results found for any visits on 06/24/24.  Assessment & Plan    There are no diagnoses linked to this encounter.  ***  No follow-ups on file.       Manuelita Flatness, PA-C  [facility] at [facility] [phone] (phone) [phone] (fax)  [facility]

## 2024-06-23 NOTE — Progress Notes (Signed)
 Hematology and Oncology Follow Up Visit  Edwin Webster [id number] Feb 22, 1961 63 y.o. 06/23/2024   Principle Diagnosis:  Waldenstrom's macroglobulinemia Iron  deficiency anemia  Current Therapy:   Rituxan /bendamustine -s/p cycle 2 -- d/c on 06/11/2018 Imbruvica   280 mg po q day -- changed on 08/06/2019 IV iron  as indicated - last received on 08/16/2022      Interim History:  Edwin Webster is here today for follow-up.  He is doing pretty well.  We last saw him 3 months ago.  As always, he is doing quite nicely.  He has been outside working.  He is very tanned.  More we last saw him, his Waldenstrom's has been doing nicely and holding steady.  His last monoclonal spike was 0.3 g/dL.  His IgM level was 430 mg/dL.  He does feel little tired.  He is a little bit more anemic.  More last saw him, his ferritin was 37 with iron  saturation of 21%.  There has been no problems with nausea or vomiting.  He has had no change in bowel or bladder habits.  He has had no rashes.  He has had no cough or shortness of breath.    He continues on the Xarelto .  His pacemaker is doing okay.  Overall, I would say that his performance status is ECOG 1.  Medications:  Allergies as of 06/23/2024       Reactions   Adalimumab Other (See Comments)   blacked out, confused        Medication List        Accurate as of June 23, 2024 10:58 AM. If you have any questions, ask your nurse or doctor.          STOP taking these medications    baclofen  10 MG tablet Commonly known as: LIORESAL  Stopped by: Maude JONELLE Crease   betamethasone  dipropionate 0.05 % ointment Commonly known as: DIPROLENE  Stopped by: Yehudit Fulginiti R Carmela Piechowski   busPIRone  7.5 MG tablet Commonly known as: BUSPAR  Stopped by: Micahel Omlor R Hamlet Lasecki   ipratropium 0.06 % nasal spray Commonly known as: ATROVENT  Stopped by: Chelsi Warr R Carl Bleecker   triamcinolone  cream 0.1 % Commonly known as: KENALOG  Stopped by: Maude JONELLE Crease       TAKE these  medications    acetaminophen  650 MG CR tablet Commonly known as: TYLENOL  Take 650 mg by mouth as needed for pain.   acyclovir  200 MG capsule Commonly known as: ZOVIRAX  TAKE 1 CAPSULE BY MOUTH TWICE A DAY   alendronate  70 MG tablet Commonly known as: FOSAMAX  Take 1 tablet (70 mg total) by mouth every 7 (seven) days. Take with a full glass of water on an empty stomach and avoid reclining or lying down for 2 hours after taking this medication.   bisoprolol  10 MG tablet Commonly known as: ZEBETA  Take   1 tablet  every Morning  for BP   escitalopram  20 MG tablet Commonly known as: LEXAPRO  Take 20 mg by mouth daily.   ezetimibe  10 MG tablet Commonly known as: ZETIA  TAKE 1 TABLET (10 MG TOTAL) BY MOUTH DAILY. PLEASE KEEP SCHEDULED APPOINTMENT   folic acid  1 MG tablet Commonly known as: FOLVITE  TAKE 1 TABLET DAILY FOR FOLATE DEFICIENCY   Imbruvica  280 MG tablet Generic drug: ibrutinib  Take 1 tablet (280 mg total) by mouth daily. Take with a glass of water.   levocetirizine 5 MG tablet Commonly known as: XYZAL  TAKE 1 TABLET BY MOUTH DAILY FOR ALLERGIES   olmesartan  40 MG tablet Commonly known as:  BENICAR  Take 1 tablet (40 mg total) by mouth at bedtime. For BP   oxyCODONE -acetaminophen  5-325 MG tablet Commonly known as: Percocet Take 1 tablet by mouth every 6 (six) hours as needed.   Rivaroxaban  15 MG Tabs tablet Commonly known as: XARELTO  Take 1 tablet (15 mg total) by mouth daily with supper.   rosuvastatin  40 MG tablet Commonly known as: CRESTOR  TAKE 1 TABLET BY MOUTH EVERY DAY FOR CHOLESTEROL   Vitamin D -3 125 MCG (5000 UT) Tabs Take 5,000 Units by mouth daily.        Allergies:  Allergies  Allergen Reactions   Adalimumab Other (See Comments)    blacked out, confused     Past Medical History, Surgical history, Social history, and Family History were reviewed and updated.   His vital signs show a temperature of 97.9.  Pulse 59.  Blood pressure  148/75.  Weight is 157 pounds.    Review of Systems: Review of Systems  Constitutional: Negative.   HENT: Negative.    Eyes: Negative.   Respiratory: Negative.    Cardiovascular: Negative.   Gastrointestinal:  Negative for blood in stool.  Genitourinary: Negative.   Musculoskeletal:  Positive for back pain.  Skin: Negative.   Neurological: Negative.   Endo/Heme/Allergies: Negative.   Psychiatric/Behavioral: Negative.        Physical Exam:   Wt Readings from Last 3 Encounters:  06/23/24 157 lb (71.2 kg)  05/11/24 165 lb (74.8 kg)  04/29/24 161 lb (73 kg)    Physical Exam Vitals reviewed.  HENT:     Head: Normocephalic and atraumatic.  Eyes:     Pupils: Pupils are equal, round, and reactive to light.  Cardiovascular:     Rate and Rhythm: Normal rate and regular rhythm.     Heart sounds: Normal heart sounds.  Pulmonary:     Effort: Pulmonary effort is normal.     Breath sounds: Normal breath sounds.  Abdominal:     General: Bowel sounds are normal.     Palpations: Abdomen is soft.  Musculoskeletal:        General: No tenderness or deformity. Normal range of motion.     Cervical back: Normal range of motion.  Lymphadenopathy:     Cervical: No cervical adenopathy.  Skin:    General: Skin is warm and dry.     Findings: No erythema or rash.  Neurological:     Mental Status: He is alert and oriented to person, place, and time.  Psychiatric:        Behavior: Behavior normal.        Thought Content: Thought content normal.        Judgment: Judgment normal.      Lab Results  Component Value Date   WBC 4.3 06/23/2024   HGB 11.9 (L) 06/23/2024   HCT 35.3 (L) 06/23/2024   MCV 93.9 06/23/2024   PLT 208 06/23/2024   Lab Results  Component Value Date   FERRITIN 37 03/24/2024   IRON  93 03/24/2024   TIBC 435 03/24/2024   UIBC 342 03/24/2024   IRONPCTSAT 21 03/24/2024   Lab Results  Component Value Date   RETICCTPCT 1.2 01/11/2022   RBC 3.76 (L) 06/23/2024    RETICCTABS 33,120 02/12/2018   Lab Results  Component Value Date   KPAFRELGTCHN 20.7 (H) 03/24/2024   LAMBDASER 13.0 03/24/2024   KAPLAMBRATIO 1.59 03/24/2024   Lab Results  Component Value Date   IGGSERUM 611 03/24/2024   IGGSERUM 624 03/24/2024  IGA 65 03/24/2024   IGA 63 03/24/2024   IGMSERUM 414 (H) 03/24/2024   IGMSERUM 447 (H) 03/24/2024   Lab Results  Component Value Date   TOTALPROTELP 6.6 03/24/2024   ALBUMINELP 3.8 03/24/2024   A1GS 0.3 03/24/2024   A2GS 0.8 03/24/2024   BETS 1.0 03/24/2024   BETA2SER 0.2 02/18/2018   GAMS 0.8 03/24/2024   MSPIKE 0.3 (H) 03/24/2024   SPEI Comment 01/11/2022     Chemistry      Component Value Date/Time   NA 139 06/23/2024 1005   NA 141 08/04/2020 1203   K 3.7 06/23/2024 1005   CL 105 06/23/2024 1005   CO2 23 06/23/2024 1005   BUN 14 06/23/2024 1005   BUN 13 08/04/2020 1203   CREATININE 1.27 (H) 06/23/2024 1005   CREATININE 1.19 04/04/2023 1042      Component Value Date/Time   CALCIUM  9.3 06/23/2024 1005   ALKPHOS 81 06/23/2024 1005   AST 21 06/23/2024 1005   ALT 7 06/23/2024 1005   BILITOT 0.5 06/23/2024 1005      Impression and Plan: Mr. Delbuono is a very pleasant 63 yo caucasian gentleman with Waldenstrom's macroglobulinemia.  He is on single agent Imbruvica .  He is doing well on this from my point of view.  So far, the Waldenstrom's studies have all been very low and steady.  I really do not see that we have to make any changes in his Imbruvica .  We will still plan to get him back in about 3 months.   Maude JONELLE Crease, MD 7/29/202510:58 AM

## 2024-06-24 ENCOUNTER — Other Ambulatory Visit: Payer: Self-pay

## 2024-06-24 ENCOUNTER — Encounter: Payer: Self-pay | Admitting: Physician Assistant

## 2024-06-24 ENCOUNTER — Ambulatory Visit (INDEPENDENT_AMBULATORY_CARE_PROVIDER_SITE_OTHER): Admitting: Physician Assistant

## 2024-06-24 VITALS — BP 148/82 | HR 61 | Ht 72.0 in | Wt 158.4 lb

## 2024-06-24 DIAGNOSIS — L989 Disorder of the skin and subcutaneous tissue, unspecified: Secondary | ICD-10-CM

## 2024-06-24 DIAGNOSIS — M79645 Pain in left finger(s): Secondary | ICD-10-CM | POA: Diagnosis not present

## 2024-06-24 DIAGNOSIS — G250 Essential tremor: Secondary | ICD-10-CM

## 2024-06-24 LAB — IGG, IGA, IGM
IgA: 54 mg/dL — ABNORMAL LOW (ref 61–437)
IgG (Immunoglobin G), Serum: 548 mg/dL — ABNORMAL LOW (ref 603–1613)
IgM (Immunoglobulin M), Srm: 408 mg/dL — ABNORMAL HIGH (ref 20–172)

## 2024-06-24 LAB — KAPPA/LAMBDA LIGHT CHAINS
Kappa free light chain: 22.9 mg/L — ABNORMAL HIGH (ref 3.3–19.4)
Kappa, lambda light chain ratio: 2.01 — ABNORMAL HIGH (ref 0.26–1.65)
Lambda free light chains: 11.4 mg/L (ref 5.7–26.3)

## 2024-06-25 ENCOUNTER — Other Ambulatory Visit: Payer: Self-pay

## 2024-06-26 ENCOUNTER — Telehealth: Payer: Self-pay | Admitting: Hematology & Oncology

## 2024-06-26 ENCOUNTER — Other Ambulatory Visit: Payer: Self-pay

## 2024-06-26 LAB — IMMUNOFIXATION REFLEX, SERUM
IgA: 62 mg/dL (ref 61–437)
IgG (Immunoglobin G), Serum: 564 mg/dL — ABNORMAL LOW (ref 603–1613)
IgM (Immunoglobulin M), Srm: 419 mg/dL — ABNORMAL HIGH (ref 20–172)

## 2024-06-26 LAB — PROTEIN ELECTROPHORESIS, SERUM, WITH REFLEX
A/G Ratio: 1.6 (ref 0.7–1.7)
Albumin ELP: 3.7 g/dL (ref 2.9–4.4)
Alpha-1-Globulin: 0.2 g/dL (ref 0.0–0.4)
Alpha-2-Globulin: 0.5 g/dL (ref 0.4–1.0)
Beta Globulin: 0.8 g/dL (ref 0.7–1.3)
Gamma Globulin: 0.7 g/dL (ref 0.4–1.8)
Globulin, Total: 2.3 g/dL (ref 2.2–3.9)
M-Spike, %: 0.3 g/dL — ABNORMAL HIGH
SPEP Interpretation: 0
Total Protein ELP: 6 g/dL (ref 6.0–8.5)

## 2024-06-26 NOTE — Telephone Encounter (Signed)
 Called to schedule infusion per inbasket. LVM to return call for scheduling.

## 2024-06-30 ENCOUNTER — Inpatient Hospital Stay: Attending: Hematology & Oncology

## 2024-06-30 VITALS — BP 173/91 | HR 65 | Resp 18

## 2024-06-30 DIAGNOSIS — D509 Iron deficiency anemia, unspecified: Secondary | ICD-10-CM | POA: Diagnosis present

## 2024-06-30 DIAGNOSIS — C8800 Waldenstrom macroglobulinemia not having achieved remission: Secondary | ICD-10-CM | POA: Insufficient documentation

## 2024-06-30 DIAGNOSIS — D500 Iron deficiency anemia secondary to blood loss (chronic): Secondary | ICD-10-CM

## 2024-06-30 MED ORDER — SODIUM CHLORIDE 0.9 % IV SOLN
300.0000 mg | Freq: Once | INTRAVENOUS | Status: AC
  Administered 2024-06-30: 300 mg via INTRAVENOUS
  Filled 2024-06-30: qty 300

## 2024-06-30 MED ORDER — SODIUM CHLORIDE 0.9 % IV SOLN
Freq: Once | INTRAVENOUS | Status: AC
Start: 2024-06-30 — End: 2024-06-30

## 2024-06-30 NOTE — Progress Notes (Signed)
 Remote pacemaker transmission.

## 2024-07-07 ENCOUNTER — Inpatient Hospital Stay

## 2024-07-07 ENCOUNTER — Telehealth: Payer: Self-pay | Admitting: Physician Assistant

## 2024-07-07 VITALS — BP 161/97 | HR 57 | Temp 97.6°F | Resp 18

## 2024-07-07 DIAGNOSIS — D509 Iron deficiency anemia, unspecified: Secondary | ICD-10-CM | POA: Diagnosis not present

## 2024-07-07 DIAGNOSIS — D500 Iron deficiency anemia secondary to blood loss (chronic): Secondary | ICD-10-CM

## 2024-07-07 MED ORDER — OLMESARTAN MEDOXOMIL 40 MG PO TABS: 40.0000 mg | ORAL_TABLET | Freq: Every day | ORAL | 3 refills | Status: DC

## 2024-07-07 MED ORDER — SODIUM CHLORIDE 0.9 % IV SOLN
Freq: Once | INTRAVENOUS | Status: AC
Start: 2024-07-07 — End: 2024-07-07

## 2024-07-07 MED ORDER — SODIUM CHLORIDE 0.9 % IV SOLN
300.0000 mg | Freq: Once | INTRAVENOUS | Status: AC
  Administered 2024-07-07 (×2): 300 mg via INTRAVENOUS
  Filled 2024-07-07: qty 100

## 2024-07-07 NOTE — Progress Notes (Signed)
 Pt refuses to stay for 30 minutes post iron  infusion.  Pt without complaints at time of discharge.

## 2024-07-07 NOTE — Telephone Encounter (Signed)
 Rx sent.

## 2024-07-07 NOTE — Patient Instructions (Signed)

## 2024-07-07 NOTE — Telephone Encounter (Signed)
 PT IS REQUESTING A REFILL OF [facility] pressure [facility] [place] on [address]. Pt not sure of the name. Pt aks to be notified when refill sent in.

## 2024-07-14 ENCOUNTER — Inpatient Hospital Stay

## 2024-07-14 VITALS — BP 149/79 | HR 60 | Temp 97.9°F | Resp 18

## 2024-07-14 DIAGNOSIS — D509 Iron deficiency anemia, unspecified: Secondary | ICD-10-CM | POA: Diagnosis not present

## 2024-07-14 DIAGNOSIS — D500 Iron deficiency anemia secondary to blood loss (chronic): Secondary | ICD-10-CM

## 2024-07-14 MED ORDER — SODIUM CHLORIDE 0.9 % IV SOLN: Freq: Once | INTRAVENOUS | Status: AC

## 2024-07-14 MED ORDER — IRON SUCROSE 300 MG IVPB - SIMPLE MED
300.0000 mg | Freq: Once | Status: AC
  Administered 2024-07-14: 300 mg via INTRAVENOUS
  Filled 2024-07-14: qty 300

## 2024-07-29 ENCOUNTER — Other Ambulatory Visit: Payer: Self-pay | Admitting: Nurse Practitioner

## 2024-07-29 DIAGNOSIS — M810 Age-related osteoporosis without current pathological fracture: Secondary | ICD-10-CM

## 2024-07-30 ENCOUNTER — Other Ambulatory Visit: Payer: Self-pay

## 2024-07-30 NOTE — Progress Notes (Signed)
 Specialty Pharmacy Refill Coordination Note  Edwin Webster is a 63 y.o. male contacted today regarding refills of specialty medication(s) Ibrutinib  (IMBRUVICA )   Patient requested Delivery   Delivery date: 08/04/24   Verified address: [address]  [place] [address]   Medication will be filled on 08/03/24.

## 2024-07-30 NOTE — Progress Notes (Signed)
 Specialty Pharmacy Ongoing Clinical Assessment Note  Edwin Webster is a 63 y.o. male who is being followed by the specialty pharmacy service for RxSp Oncology   Patient's specialty medication(s) reviewed today: Ibrutinib  (IMBRUVICA )   Missed doses in the last 4 weeks: 2   Patient/Caregiver did not have any additional questions or concerns.   Therapeutic benefit summary: Patient is achieving benefit   Adverse events/side effects summary: No adverse events/side effects   Patient's therapy is appropriate to: Continue    Goals Addressed             This Visit's Progress    Slow Disease Progression   On track    Patient is on track. Patient will maintain adherence. Per Dr. Jessy office visit notes from 06/23/24, patients levels remain low and steady.          Follow up: 3 months  Silvano LOISE Dolly Specialty Pharmacist

## 2024-08-03 ENCOUNTER — Other Ambulatory Visit: Payer: Self-pay

## 2024-08-05 ENCOUNTER — Other Ambulatory Visit (HOSPITAL_COMMUNITY): Payer: Self-pay | Admitting: Neurosurgery

## 2024-08-05 DIAGNOSIS — M8088XS Other osteoporosis with current pathological fracture, vertebra(e), sequela: Secondary | ICD-10-CM

## 2024-08-06 ENCOUNTER — Ambulatory Visit: Payer: Self-pay | Admitting: Cardiology

## 2024-08-06 ENCOUNTER — Ambulatory Visit (INDEPENDENT_AMBULATORY_CARE_PROVIDER_SITE_OTHER): Payer: Medicare Other

## 2024-08-06 DIAGNOSIS — I495 Sick sinus syndrome: Secondary | ICD-10-CM | POA: Diagnosis not present

## 2024-08-06 LAB — CUP PACEART REMOTE DEVICE CHECK
Battery Remaining Longevity: 83 mo
Battery Remaining Percentage: 67 %
Battery Voltage: 2.99 V
Brady Statistic AP VP Percent: 1 %
Brady Statistic AP VS Percent: 73 %
Brady Statistic AS VP Percent: 1 %
Brady Statistic AS VS Percent: 27 %
Brady Statistic RA Percent Paced: 73 %
Brady Statistic RV Percent Paced: 1 %
Date Time Interrogation Session: 20250911020024
Implantable Lead Connection Status: 753985
Implantable Lead Connection Status: 753985
Implantable Lead Implant Date: 20210915
Implantable Lead Implant Date: 20210915
Implantable Lead Location: 753859
Implantable Lead Location: 753860
Implantable Pulse Generator Implant Date: 20210915
Lead Channel Impedance Value: 430 Ohm
Lead Channel Impedance Value: 550 Ohm
Lead Channel Pacing Threshold Amplitude: 0.5 V
Lead Channel Pacing Threshold Amplitude: 0.75 V
Lead Channel Pacing Threshold Pulse Width: 0.5 ms
Lead Channel Pacing Threshold Pulse Width: 0.5 ms
Lead Channel Sensing Intrinsic Amplitude: 3.2 mV
Lead Channel Sensing Intrinsic Amplitude: 8.6 mV
Lead Channel Setting Pacing Amplitude: 1 V
Lead Channel Setting Pacing Amplitude: 1.5 V
Lead Channel Setting Pacing Pulse Width: 0.5 ms
Lead Channel Setting Sensing Sensitivity: 2 mV
Pulse Gen Model: 2272
Pulse Gen Serial Number: 3864545

## 2024-08-14 NOTE — Progress Notes (Signed)
 Remote PPM Transmission

## 2024-08-17 ENCOUNTER — Other Ambulatory Visit (HOSPITAL_COMMUNITY): Payer: Self-pay

## 2024-08-20 ENCOUNTER — Encounter: Payer: Self-pay | Admitting: Family

## 2024-08-21 NOTE — CV Procedure (Signed)
  Device system confirmed to be MRI conditional, with implant date > 6 weeks ago, and no evidence of abandoned or epicardial leads in review of most recent CXR  Device last cleared by EP Provider: Charlies Arthur 08/20/24  Clearance is good through for 1 year as long as parameters remain stable at time of check. If pt undergoes a cardiac device procedure during that time, they should be re-cleared.   Tachy-therapies to be programmed off if applicable with device back to pre-MRI settings after completion of exam.  [employer] will be present for programming for the MRI.   Rocky Catalan, RT  08/21/2024 7:57 AM

## 2024-08-24 ENCOUNTER — Other Ambulatory Visit: Payer: Self-pay

## 2024-08-24 NOTE — Progress Notes (Signed)
 Specialty Pharmacy Refill Coordination Note  Edwin Webster is a 63 y.o. male contacted today regarding refills of specialty medication(s) Ibrutinib  (IMBRUVICA )   Patient requested Delivery   Delivery date: 08/26/24   Verified address: [address]  [place] [address]   Medication will be filled on 08/25/24.

## 2024-08-25 ENCOUNTER — Ambulatory Visit (HOSPITAL_COMMUNITY)
Admission: RE | Admit: 2024-08-25 | Discharge: 2024-08-25 | Disposition: A | Discharge status: Home / Self Care | Source: Ambulatory Visit | Attending: Neurosurgery | Admitting: Neurosurgery

## 2024-08-25 DIAGNOSIS — M8088XS Other osteoporosis with current pathological fracture, vertebra(e), sequela: Secondary | ICD-10-CM | POA: Insufficient documentation

## 2024-08-25 DIAGNOSIS — M4316 Spondylolisthesis, lumbar region: Secondary | ICD-10-CM | POA: Diagnosis not present

## 2024-08-25 DIAGNOSIS — S32059A Unspecified fracture of fifth lumbar vertebra, initial encounter for closed fracture: Secondary | ICD-10-CM | POA: Diagnosis not present

## 2024-08-25 DIAGNOSIS — M4856XA Collapsed vertebra, not elsewhere classified, lumbar region, initial encounter for fracture: Secondary | ICD-10-CM | POA: Diagnosis not present

## 2024-08-25 DIAGNOSIS — X58XXXA Exposure to other specified factors, initial encounter: Secondary | ICD-10-CM | POA: Diagnosis not present

## 2024-08-25 DIAGNOSIS — M51369 Other intervertebral disc degeneration, lumbar region without mention of lumbar back pain or lower extremity pain: Secondary | ICD-10-CM | POA: Insufficient documentation

## 2024-08-25 DIAGNOSIS — Z950 Presence of cardiac pacemaker: Secondary | ICD-10-CM | POA: Insufficient documentation

## 2024-08-25 NOTE — Progress Notes (Signed)
 Patient was monitored by this RN during MRI scan due to presence of a pacemaker. Cardiac rhythm was continuously monitored throughout the procedure. Prior to the start of the scan, the pacemaker was placed in MRI-safe mode by the pacemaker representative. Following the completion of the scan, the device was returned to its pre-MRI settings by the pacemaker representative. Neurological status and orientation post-procedure were unchanged from baseline.   Pre-procedure Heart Rate (Prior to being placed in MRI safe mode): 60-63 bpm Post-procedure Heart Rate (Once pacemaker is returned to baseline mode): 60-62 bpm

## 2024-09-10 ENCOUNTER — Telehealth (HOSPITAL_BASED_OUTPATIENT_CLINIC_OR_DEPARTMENT_OTHER): Payer: Self-pay

## 2024-09-10 ENCOUNTER — Other Ambulatory Visit: Payer: Self-pay | Admitting: Neurosurgery

## 2024-09-10 ENCOUNTER — Encounter: Payer: Self-pay | Admitting: Family

## 2024-09-10 ENCOUNTER — Telehealth: Payer: Self-pay

## 2024-09-10 NOTE — Telephone Encounter (Signed)
   Name: Edwin Webster  DOB: 1961-05-04  MRN: [id number]  Primary Cardiologist: None   Preoperative team, please contact this patient and set up a phone call appointment for further preoperative risk assessment. Please obtain consent and complete medication review. Thank you for your help.  I confirm that guidance regarding antiplatelet and oral anticoagulation therapy has been completed and, if necessary, noted below.  Guidance regarding holding of Xarelto  has been sent to pharmacy team.   I also confirmed the patient resides in the state of [place] . As per [facility] Medical Board telemedicine laws, the patient must reside in the state in which the provider is licensed.   [name] D [name], NP 09/10/2024, 4:28 PM [facility]

## 2024-09-10 NOTE — Telephone Encounter (Signed)
   Pre-operative Risk Assessment    Patient Name: Edwin Webster  DOB: [birth year] MRN: [id number]   Date of last office visit: 10/29/2023 with Daphne Barrack, NP Date of next office visit: N/A   Request for Surgical Clearance    Procedure:  Lumbar Kyphoplasty  Date of Surgery:  Clearance 09/16/24                                 Surgeon:  Dr. Gerldine Maizes Surgeon's Group or Practice Name:  [facility] Phone number:  [phone] ext. [phone] Fax number:  [phone] or [phone]   Type of Clearance Requested:   - Medical  - Pharmacy:  Hold Rivaroxaban  (Xarelto ) not indicated   Type of Anesthesia:  General    Additional requests/questions:  Patient has SJ PPM Please sign and fax form along with any office notes to [phone] or [phone] Attention: Levon Bonney Patrcia Iverson L   09/10/2024, 4:06 PM

## 2024-09-10 NOTE — Telephone Encounter (Signed)
 Patient has been scheduled for televisit med rec and consent done     Patient Consent for Virtual Visit         Edwin Webster has provided verbal consent on 09/10/2024 for a virtual visit (video or telephone).   CONSENT FOR VIRTUAL VISIT FOR:  Edwin Webster  By participating in this virtual visit I agree to the following:  I hereby voluntarily request, consent and authorize [facility] and its employed or contracted physicians, physician assistants, nurse practitioners or other licensed health care professionals (the Practitioner), to provide me with telemedicine health care services (the "Services) as deemed necessary by the treating Practitioner. I acknowledge and consent to receive the Services by the Practitioner via telemedicine. I understand that the telemedicine visit will involve communicating with the Practitioner through live audiovisual communication technology and the disclosure of certain medical information by electronic transmission. I acknowledge that I have been given the opportunity to request an in-person assessment or other available alternative prior to the telemedicine visit and am voluntarily participating in the telemedicine visit.  I understand that I have the right to withhold or withdraw my consent to the use of telemedicine in the course of my care at any time, without affecting my right to future care or treatment, and that the Practitioner or I may terminate the telemedicine visit at any time. I understand that I have the right to inspect all information obtained and/or recorded in the course of the telemedicine visit and may receive copies of available information for a reasonable fee.  I understand that some of the potential risks of receiving the Services via telemedicine include:  Delay or interruption in medical evaluation due to technological equipment failure or disruption; Information transmitted may not be sufficient (e.g. poor resolution of images)  to allow for appropriate medical decision making by the Practitioner; and/or  In rare instances, security protocols could fail, causing a breach of personal health information.  Furthermore, I acknowledge that it is my responsibility to provide information about my medical history, conditions and care that is complete and accurate to the best of my ability. I acknowledge that Practitioner's advice, recommendations, and/or decision may be based on factors not within their control, such as incomplete or inaccurate data provided by me or distortions of diagnostic images or specimens that may result from electronic transmissions. I understand that the practice of medicine is not an exact science and that Practitioner makes no warranties or guarantees regarding treatment outcomes. I acknowledge that a copy of this consent can be made available to me via my patient portal [facility] MyChart), or I can request a printed copy by calling the office of [facility].    I understand that my insurance will be billed for this visit.   I have read or had this consent read to me. I understand the contents of this consent, which adequately explains the benefits and risks of the Services being provided via telemedicine.  I have been provided ample opportunity to ask questions regarding this consent and the Services and have had my questions answered to my satisfaction. I give my informed consent for the services to be provided through the use of telemedicine in my medical care

## 2024-09-10 NOTE — Telephone Encounter (Signed)
Patient has been scheduled for televisit.

## 2024-09-11 ENCOUNTER — Encounter: Payer: Self-pay | Admitting: Cardiology

## 2024-09-11 ENCOUNTER — Ambulatory Visit: Attending: Internal Medicine

## 2024-09-11 DIAGNOSIS — Z01810 Encounter for preprocedural cardiovascular examination: Secondary | ICD-10-CM

## 2024-09-11 NOTE — Progress Notes (Signed)
 Virtual Visit via Telephone Note   Because of Edwin Webster co-morbid illnesses, he is at least at moderate risk for complications without adequate follow up.  This format is felt to be most appropriate for this patient at this time.  Due to technical limitations with video connection (technology), today's appointment will be conducted as an audio only telehealth visit, and Edwin Webster verbally agreed to proceed in this manner.   All issues noted in this document were discussed and addressed.  No physical exam could be performed with this format.  Evaluation Performed:  Preoperative cardiovascular risk assessment _____________   Date:  09/11/2024   Patient ID:  Edwin Webster, DOB Oct 04, 1961, MRN [id number] Patient Location:  Home Provider location:   Office  Primary Care Provider:  Daryl Setter, NP Primary Cardiologist:  None Chief Complaint / Patient Profile  63 y.o. y/o male with a h/o sick sinus syndrome s/p PPM, hypertension, hyperlipidemia, RA, CAD, IAD,  Waldenstrom's macroglobulinemia and atrial fibrillation, who is pending lumbar kyphoplasty and presents today for telephonic preoperative cardiovascular risk assessment. History of Present Illness  Edwin Webster is a 63 y.o. male who presents via audio/video conferencing for a telehealth visit today.  Pt was last seen in cardiology clinic on 02/13/2024 by Fairy Heinrich, PA.  At that time Edwin Webster was doing well.  The patient is now pending procedure as outlined above. Since his last visit, he has remained stable from a cardiac standpoint. Today he denies chest pain, shortness of breath, lower extremity edema, fatigue, palpitations, melena, hematuria, hemoptysis, diaphoresis, weakness, presyncope, syncope, orthopnea, and PND. He is able to achieve greater than 4 METS of activity with climbing of stairs, household chores and yard work. Past Medical History    Past Medical History:  Diagnosis Date   Allergy    seasonal  allergies   Anemia    Anxiety    on meds   Arthritis    Chest pain, non-cardiac    COPD (chronic obstructive pulmonary disease) (HCC)    past smoker   Counseling regarding goals of care 03/27/2018   Depression    on meds   Diverticulitis    Diverticulosis    Fractured pelvis (HCC)    fall from ladder   GERD (gastroesophageal reflux disease)    on meds   Gout    History of shingles    Hyperlipidemia    on meds   Hypertension    on meds   Internal hemorrhoids    Iron  deficiency anemia 03/13/2018   Kidney stones    Osteoporosis    Other abnormal glucose 10/11/2015   Presence of permanent cardiac pacemaker    Rheumatoid arthritis (HCC)    hands   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   Past Surgical History:  Procedure Laterality Date   COLONOSCOPY  2016   JMP-suprep(exc)-mild TICS/int hems/normal-5 yr recall   FINGER SURGERY Right    ring finger   iron  infusion     KNEE SURGERY     right   KYPHOPLASTY Bilateral 10/10/2023   Procedure: KYPHOPLASTY Thoracic eight;  Surgeon: Lanis Pupa, MD;  Location: [facility] OR;  Service: Neurosurgery;  Laterality: Bilateral;   PACEMAKER IMPLANT N/A 08/10/2020   Procedure: PACEMAKER IMPLANT;  Surgeon: Inocencio Soyla Lunger, MD;  Location: [facility] INVASIVE CV LAB;  Service: Cardiovascular;  Laterality: N/A;   ROTATOR CUFF REPAIR  1996   left   SPINE SURGERY     WISDOM TOOTH EXTRACTION  Allergies  Allergies  Allergen Reactions   Adalimumab Other (See Comments)    blacked out, confused     Home Medications    Prior to Admission medications   Medication Sig Start Date End Date Taking? Authorizing Provider  acetaminophen  (TYLENOL ) 650 MG CR tablet Take 650 mg by mouth as needed for pain.    [provider]  acyclovir  (ZOVIRAX ) 200 MG capsule TAKE 1 CAPSULE BY MOUTH TWICE A DAY 06/04/24   Timmy Maude SAUNDERS, MD  bisoprolol  (ZEBETA ) 10 MG tablet Take   1 tablet  every Morning  for BP 03/10/24   Drubel, Manuelita, PA-C   Cholecalciferol (VITAMIN D -3) 5000 UNITS TABS Take 5,000 Units by mouth daily.    [provider]  escitalopram  (LEXAPRO ) 20 MG tablet Take 20 mg by mouth daily.    [provider]  ezetimibe  (ZETIA ) 10 MG tablet TAKE 1 TABLET (10 MG TOTAL) BY MOUTH DAILY. PLEASE KEEP SCHEDULED APPOINTMENT 12/03/23   Camnitz, Soyla Lunger, MD  folic acid  (FOLVITE ) 1 MG tablet TAKE 1 TABLET DAILY FOR FOLATE DEFICIENCY 06/03/23   Wilkinson, Dana E, NP  ibrutinib  (IMBRUVICA ) 280 MG tablet Take 1 tablet (280 mg total) by mouth daily. Take with a glass of water. 12/04/23   Timmy Maude SAUNDERS, MD  levocetirizine (XYZAL ) 5 MG tablet TAKE 1 TABLET BY MOUTH DAILY FOR ALLERGIES 11/28/23   Wilkinson, Dana E, NP  olmesartan  (BENICAR ) 40 MG tablet Take 1 tablet (40 mg total) by mouth at bedtime. For BP 07/07/24   Drubel, Manuelita, PA-C  oxyCODONE -acetaminophen  (PERCOCET) 5-325 MG tablet Take 1 tablet by mouth every 6 (six) hours as needed. 05/10/24   Patt Alm Macho, MD  rivaroxaban  (XARELTO ) 15 MG TABS tablet Take 1 tablet (15 mg total) by mouth daily with supper. 02/27/24   Terra Fairy PARAS, PA-C  rosuvastatin  (CRESTOR ) 40 MG tablet TAKE 1 TABLET BY MOUTH EVERY DAY FOR CHOLESTEROL 11/26/23   Wilkinson, Dana E, NP   Physical Exam  Vital Signs:  Edwin Webster does not have vital signs available for review today. Given telephonic nature of communication, physical exam is limited. AAOx3. NAD. Normal affect.  Speech and respirations are unlabored. Accessory Clinical Findings   None Assessment & Plan   1.  Preoperative Cardiovascular Risk Assessment: [name] perioperative risk of a major cardiac event is 0.9% according to the Revised Cardiac Risk Index (RCRI).  Therefore, he is at low risk for perioperative complications.   His functional capacity is good at 6.27 METs according to the Duke Activity Status Index (DASI). Recommendations: According to ACC/AHA guidelines, no further cardiovascular testing needed.  The  patient may proceed to surgery at acceptable risk.   Antiplatelet and/or Anticoagulation Recommendations: Xarelto  (Rivaroxaban ) can be held for 3 days prior to surgery.  Please resume post op when felt to be safe.     The patient was advised that if he develops new symptoms prior to surgery to contact our office to arrange for a follow-up visit, and he verbalized understanding.  A copy of this note will be routed to requesting surgeon.  Time:   Today, I have spent 11 minutes with the patient with telehealth technology discussing medical history, symptoms, and management plan.    Jalana Moore D Harol Shabazz, NP  09/11/2024, 1:10 PM

## 2024-09-11 NOTE — Progress Notes (Signed)
 PERIOPERATIVE PRESCRIPTION FOR IMPLANTED CARDIAC DEVICE PROGRAMMING  Patient Information: Name:  Edwin Webster  DOB:  11/28/1960  MRN:  [id number]   Procedure: Lumbar Kyphoplasty   Date of Surgery: Clearance 09/16/24                                 Surgeon: Dr. Gerldine Maizes Surgeon's Group or Practice Name: [facility] Phone number:  [phone] ext. [phone] Fax number:  [phone] or [phone]  Device Information:  Clinic EP Physician:  Soyla Norton, MD   Device Type:  Pacemaker Manufacturer and Phone #:  St. Jude/[employer]ott: [phone] Pacemaker Dependent?:  No. Date of Last Device Check:  08/06/2024  Normal Device Function?:  Yes.    Electrophysiologist's Recommendations:  Have magnet available. Provide continuous ECG monitoring when magnet is used or reprogramming is to be performed.  Procedure will likely interfere with device function.  Device should be programmed:  Asynchronous pacing during procedure and returned to normal programming after procedure. Due to prone position during procedure.  Per Device Clinic [address] Edwin [place], [place]  7:32 AM 09/11/2024

## 2024-09-11 NOTE — Telephone Encounter (Signed)
 Patient with diagnosis of Afib on Xarelto  for anticoagulation.    Procedure: Lumbar Kyphoplasty  Date of procedure: 09/16/24    CHA2DS2-VASc Score = 3   This indicates a 3.2% annual risk of stroke. The patient's score is based upon: CHF History: 0 HTN History: 1 Diabetes History: 0 Stroke History: 0 Vascular Disease History: 1 Age Score: 1 Gender Score: 0     CrCl 59 mL/min  Platelet count 208 K  Patient has not had an Afib/aflutter ablation in the last 3 months, DCCV within the last 4 weeks or a watchman implanted in the last 45 days    Per office protocol, patient can hold Xarelto  for 3 days prior to procedure.   Patient will not need bridging with Lovenox (enoxaparin) around procedure.  **This guidance is not considered finalized until pre-operative APP has relayed final recommendations.**

## 2024-09-14 ENCOUNTER — Encounter (HOSPITAL_COMMUNITY): Payer: Self-pay | Admitting: Neurosurgery

## 2024-09-14 ENCOUNTER — Other Ambulatory Visit: Payer: Self-pay

## 2024-09-14 NOTE — Progress Notes (Signed)
 SDW CALL  Patient was given pre-op instructions over the phone. The opportunity was given for the patient to ask questions. No further questions asked. Patient verbalized understanding of instructions given.   PCP - Eleanor O'Sullivan,NP Cardiologist - Will Gladis Inocencio COME  PPM/ICD - St Jude pacemaker Device Orders - in Epic -see Encounters Rep Notified - Redell Brittle notified via telephone. Redell stated someone from the team will be present at 0645.  Chest x-ray -  EKG - 02/17/24 Stress Test - 06/07/2005 ECHO - 07/27/20 Cardiac Cath - denies  Sleep Study - 11/10/17-pt states he was told he did not have sleep apnea.  CPAP - no  Fasting Blood Sugar - na Checks Blood Sugar _____ times a day  Blood Thinner Instructions:pt reports he stopped his Xarelto  on 08/27/24. Aspirin Instructions:na  ERAS Protcol -NPO PRE-SURGERY Ensure or G2-   COVID TEST- na   Anesthesia review: yes pt has pacemaker  Patient denies shortness of breath, fever, cough and chest pain over the phone call     Special instructions:    Oral Hygiene is also important to reduce your risk of infection.  Remember - BRUSH YOUR TEETH THE MORNING OF SURGERY WITH YOUR REGULAR TOOTHPASTE

## 2024-09-15 ENCOUNTER — Encounter: Payer: Self-pay | Admitting: Cardiology

## 2024-09-15 ENCOUNTER — Other Ambulatory Visit (HOSPITAL_COMMUNITY): Payer: Self-pay

## 2024-09-15 NOTE — Progress Notes (Signed)
 Anesthesia Chart Review: SAME DAY WORK-UP  Case: [id number] Date/Time: 09/16/24 0715   Procedure: KYPHOPLASTY (Bilateral) - KYPHOPLASTY L3   Anesthesia type: General   Diagnosis: Age-related osteoporosis with current pathological fracture of vertebra with delayed healing [M80.08XG]   Pre-op diagnosis: AGE-RELATED OSTEOPAROSIS WITH CURRENT PATHOLOGICAL FX WITH DELAYED HEALING   [facility]OM [facility] / [facility]   Surgeons: Lanis Pupa, MD       DISCUSSION: Patient is a 63 year old male scheduled for the above procedure. S/p T8 kyphoplasty on 10/10/2023.   History includes former smoker, HTN, HLD, SSS (s/p St Jude Medical Assurity MRI dual-chamber PPM 08/10/20), COPD, Waldenstrm's macroglobulinemia, iron  deficiency anemia, GERD, seronegative RA, gout, diverticulitis, skin cancer (BCC, excision from nose 11/2019), spinal surgery (T8 kyphoplasty 10/10/2023).   Last visit with HEM-ONC Dr. Timmy was on 06/23/2024 for follow-up IDA and Waldenstrom's macroglobulinemia. Previously on Rituxan /bendamustine  in 2019. He started Imbruvica  ~ 2020. In regards to Waldenstrom's, his monoclonal spike was 0.3%, IgM level was 408 mg/dL and kappa light chain ratio was 2.01 on 06/23/2024. Overall felt to be doing well from his standpoint and no changes made. 3 month follow-up planned. He receives as needed IV iron , last 07/07/2024.  Last Afib Clinic follow-up was on 02/17/2024 with Terra Pac, PA-C.  He was a-paced. CHADS2VASC score of 3. He had not been on OAC. He was noted to have a nearly 2 hour episode of afib on 02/12/2024, but overall felt to have a low afib burden. He was contemplating starting anticoagulation therapy but wanted to further discuss with oncology. They did discuss risks and benefits of anticoagulation at that visit. Ultimately started on Xarelto  daily in April 2025.    Preoperative cardiology risk assessment outlined on 09/11/2024 by West, Katlyn, NP, Preoperative Cardiovascular Risk  Assessment: Mr. Boissonneault perioperative risk of a major cardiac event is 0.9% according to the Revised Cardiac Risk Index (RCRI).  Therefore, he is at low risk for perioperative complications.   His functional capacity is good at 6.27 METs according to the Duke Activity Status Index (DASI). Recommendations: According to ACC/AHA guidelines, no further cardiovascular testing needed.  The patient may proceed to surgery at acceptable risk.   Antiplatelet and/or Anticoagulation Recommendations: Xarelto  (Rivaroxaban ) can be held for 3 days prior to surgery.  Please resume post op when felt to be safe...    He last saw cardiologist Dr. Court on 10/18/20 following a CCTA for atypical chest pain 08/03/20 CCTA that primarily showed nonobstructive disease except in the distal circumflex.  His CAC was 665.  Dr. Court suspected atypical chest pain was not cardiac. Fatigue and atypical symptoms markedly improved following PM 08/10/20. No additional CV testing ordered at that time. 12 month follow-up was planned, but he has primarily just been following with EP cardiology, as above.    EP Perioperative PPM Management Recommendations: Device Information: Clinic EP Physician:  Soyla Norton, MD  Device Type:  Pacemaker Manufacturer and Phone #:  St. Jude/Abbott: [phone] Pacemaker Dependent?:  No. Date of Last Device Check:  08/06/2024      Normal Device Function?:  Yes.     Electrophysiologist's Recommendations: Have magnet available. Provide continuous ECG monitoring when magnet is used or reprogramming is to be performed.  Procedure will likely interfere with device function.  Device should be programmed:  Asynchronous pacing during procedure and returned to normal programming after procedure. Due to prone position during procedure.   He has a same-day workup, so anesthesia team to evaluate on the day  of surgery. Last reported Xarelto  was on 08/27/2024. He said he stopped on his own after he learned he  would require another back surgery.    VS:  Wt Readings from Last 3 Encounters:  06/24/24 71.8 kg  06/23/24 71.2 kg  05/11/24 74.8 kg   BP Readings from Last 3 Encounters:  07/14/24 (!) 149/79  07/07/24 (!) 161/97  06/30/24 (!) 173/91   Pulse Readings from Last 3 Encounters:  07/14/24 60  07/07/24 (!) 57  06/30/24 65     PROVIDERS: Daryl Setter, NP is PCP  Inocencio Chi, MD is EP cardiologist Timmy Coy, MD is [facility] Ishmael Slough, MD is rheumatologist   LABS: Most recent results in [facility] include: Lab Results  Component Value Date   WBC 4.3 06/23/2024   HGB 11.9 (L) 06/23/2024   HCT 35.3 (L) 06/23/2024   PLT 208 06/23/2024   GLUCOSE 90 06/23/2024   ALT 7 06/23/2024   AST 21 06/23/2024   NA 139 06/23/2024   K 3.7 06/23/2024   CL 105 06/23/2024   CREATININE 1.27 (H) 06/23/2024   BUN 14 06/23/2024   CO2 23 06/23/2024   TSH 1.60 10/28/2023   PSA 0.58 10/28/2023   HGBA1C 5.4 10/28/2023    Sleep Study 11/20/17: IMPRESSION:  1. Periodic Limb Movement Disorder (PLMD).  2. Primary Snoring. 3. Dysfunctions associated with sleep stages or arousal from sleep. (This study does not demonstrate any significant obstructive or central sleep disordered breathing with the exception of mild to at times moderate snoring.)     IMAGES: MRI L-Spine 08/25/2024: IMPRESSION: 1. Chronic T12 compression fracture 2. Acute benign compression fracture of the superior endplate of L3 with minimal loss of vertebral height 3. 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 Moderate degenerative disc disease at L4-5 with slight degenerative spondylolisthesis without spinal stenosis   MRI Brain 01/24/2024: IMPRESSION: 1. No acute finding. No evidence of intracranial neoplastic disease. 2. Chronic small-vessel ischemic change of the deep white matter of the cerebral hemispheres, progressive since 2018. 3. 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 4. Ventricular prominence is again felt probably secondary to central atrophy, though normal pressure hydrocephalus is not completely excluded. 5. Minimal heterogeneity of the marrow pattern at the clivus and cervical spine is less noticeable than was seen in 2018. 6. Small amount of fluid in the mastoid air cells, right more than left. This could be subclinical.    EKG: EKG 02/17/2024: Atrial-paced rhythm with occasional Premature ventricular complexes Rightward axis Abnormal ECG When compared with ECG of 10-Oct-2023 08:12, PREVIOUS ECG IS PRESENT Confirmed by Wonda Sharper [phone]) on 02/18/2024 5:05:02 PM   CV: CT Coronary 08/03/20: IMPRESSION: 1. 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 These lesions may be subject to calcium  blooming artifact, therefore CT FFR will be performed and reported separately. 2. Coronary calcium  score is 665, which places the patient in the 92nd percentile for age and sex matched controls. 3. 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 Benign course, no slit like origin. Right dominance. FFR 08/03/20: 1. Left Main: FFR = 0.97   2. LAD: Proximal FFR = 0.93, Mid FFR = 0.90, Distal FFR = 0.86, D1 FFR = 0.82 3. LCX: Proximal FFR = 0.98, Distal FFR = 0.61 4. RCA: Proximal FFR = 0.91, Mid FFR =0.91, Distal FFR = 0.90, PDA not mapped, PL proximal  FFR 0.90   FFR IMPRESSION: 1. CT FFR analysis showed significant stenosis in the distal left circumflex. Severe lesions in proximal LAD and D1 likely overestimated due to calcium  blooming artifact.   RECOMMENDATIONS: Guideline directed medical therapy and aggressive risk factor modification for secondary prevention of coronary artery disease. - Per Dr. Court 10/18/20, History of atypical chest pain with coronary calcium  score 665 measured 08/03/2020 with  distal circumflex disease. I suspect his chest pain is noncardiac. Fatigue and atypical symptoms markedly improved following PPM insertion 08/10/20.    Echo 07/27/20: IMPRESSIONS   1. Left ventricular ejection fraction, by estimation, is 55 to 60%. The  left ventricle has normal function. The left ventricle has no regional  wall motion abnormalities. Left ventricular diastolic parameters were  normal.   2. Right ventricular systolic function is normal. The right ventricular  size is normal.   3. The mitral valve is normal in structure. No evidence of mitral valve  regurgitation. No evidence of mitral stenosis.   4. The aortic valve is normal in structure. Aortic valve regurgitation is  mild. Mild aortic valve sclerosis is present, with no evidence of aortic  valve stenosis.   5. The inferior vena cava is normal in size with greater than 50%  respiratory variability, suggesting right atrial pressure of 3 mmHg.     US  Carotid 09/20/17: Final Interpretation:  - Right Carotid: There is evidence in the right ICA of a 1-39% stenosis.  - Left Carotid: There is evidence in the left ICA of a 1-39% stenosis.  - Vertebrals:  Both vertebral arteries were patent with antegrade flow.  - Subclavians: Normal flow hemodynamics were seen in bilateral subclavian               arteries.   Past Medical History:  Diagnosis Date   Allergy    seasonal allergies   Anemia    Anxiety    on meds   Arthritis    Chest pain, non-cardiac    COPD (chronic obstructive pulmonary disease) (HCC)    past smoker   Counseling regarding goals of care 03/27/2018   Depression    on meds   Diverticulitis    Diverticulosis    Fractured pelvis (HCC)    fall from ladder   GERD (gastroesophageal reflux disease)    on meds   Gout    History of shingles    Hyperlipidemia    on meds   Hypertension    on meds   Internal hemorrhoids    Iron  deficiency anemia 03/13/2018   Kidney stones    Osteoporosis    Other  abnormal glucose 10/11/2015   Presence of permanent cardiac pacemaker    Rheumatoid arthritis (HCC)    hands   Waldenstrom's macroglobulinemia (HCC) 03/27/2018    Past Surgical History:  Procedure Laterality Date   COLONOSCOPY  2016   JMP-suprep(exc)-mild TICS/int hems/normal-5 yr recall   FINGER SURGERY Right    ring finger   iron  infusion     KNEE SURGERY     right   KYPHOPLASTY Bilateral 10/10/2023   Procedure: KYPHOPLASTY Thoracic eight;  Surgeon: Lanis Pupa, MD;  Location[facility];  Service: Neurosurgery;  Laterality: Bilateral;   PACEMAKER IMPLANT N/A 08/10/2020   Procedure: PACEMAKER IMPLANT;  Surgeon: Inocencio Soyla Lunger, MD;  Location: [facility] INVASIVE CV LAB;  Service: Cardiovascular;  Laterality: N/A;   ROTATOR CUFF REPAIR  1996   left   SPINE SURGERY     WISDOM TOOTH EXTRACTION  MEDICATIONS: No current facility-administered medications for this encounter.    acyclovir  (ZOVIRAX ) 200 MG capsule   alendronate  (FOSAMAX ) 70 MG tablet   bisoprolol  (ZEBETA ) 10 MG tablet   Cholecalciferol (VITAMIN D -3) 5000 UNITS TABS   escitalopram  (LEXAPRO ) 20 MG tablet   ezetimibe  (ZETIA ) 10 MG tablet   folic acid  (FOLVITE ) 1 MG tablet   ibrutinib  (IMBRUVICA ) 280 MG tablet   levocetirizine (XYZAL ) 5 MG tablet   olmesartan  (BENICAR ) 40 MG tablet   rivaroxaban  (XARELTO ) 15 MG TABS tablet   rosuvastatin  (CRESTOR ) 40 MG tablet    Isaiah Ruder, PA-C Surgical Short Stay/Anesthesiology [facility] Phone [phone] [facility] Phone [phone] 09/15/2024 12:21 PM

## 2024-09-15 NOTE — Progress Notes (Signed)
 Open in Error.

## 2024-09-15 NOTE — Anesthesia Preprocedure Evaluation (Addendum)
 Anesthesia Evaluation  Patient identified by MRN, date of birth, ID band Patient awake    Reviewed: Allergy & Precautions, H&P , NPO status , Patient's Chart, lab work & pertinent test results  Airway Mallampati: II   Neck ROM: full    Dental  (+) Poor Dentition, Missing, Dental Advisory Given, Edentulous Upper,    Pulmonary neg pulmonary ROS, COPD, former smoker   breath sounds clear to auscultation       Cardiovascular hypertension, Pt. on medications + CAD  negative cardio ROS + dysrhythmias + pacemaker  Rhythm:regular Rate:Normal  EKG 02/17/2024: Atrial-paced rhythm with occasional Premature ventricular complexes Rightward axis Abnormal ECG When compared with ECG of 10-Oct-2023 08:12, PREVIOUS ECG IS PRESENT Confirmed by Wonda Sharper [phone]) on 02/18/2024 5:05:02 PM   Echo 07/27/20:  1. Left ventricular ejection fraction, by estimation, is 55 to 60%. The  left ventricle has normal function. The left ventricle has no regional  wall motion abnormalities. Left ventricular diastolic parameters were  normal.   2. Right ventricular systolic function is normal. The right ventricular  size is normal.   3. The mitral valve is normal in structure. No evidence of mitral valve  regurgitation. No evidence of mitral stenosis.   4. The aortic valve is normal in structure. Aortic valve regurgitation is  mild. Mild aortic valve sclerosis is present, with no evidence of aortic  valve stenosis.   5. The inferior vena cava is normal in size with greater than 50%  respiratory variability, suggesting right atrial pressure of 3 mmHg.     Neuro/Psych  PSYCHIATRIC DISORDERS Anxiety Depression    US  Carotid 09/20/17: Final Interpretation:  - Right Carotid: There is evidence in the right ICA of a 1-39% stenosis.  - Left Carotid: There is evidence in the left ICA of a 1-39% stenosis.  - Vertebrals:  Both vertebral arteries were patent with  antegrade flow.  - Subclavians: Normal flow hemodynamics were seen in bilateral subclavian               arteries.   negative neurological ROS  negative psych ROS   GI/Hepatic negative GI ROS, Neg liver ROS,GERD  ,,  Endo/Other  negative endocrine ROS    Renal/GU Renal diseasenegative Renal ROSstones  negative genitourinary   Musculoskeletal negative musculoskeletal ROS (+) Arthritis ,    Abdominal   Peds negative pediatric ROS (+)  Hematology negative hematology ROS (+) Blood dyscrasia, anemia Waldenstrom's macroglobinemia   Anesthesia Other Findings   Reproductive/Obstetrics negative OB ROS                              Anesthesia Physical Anesthesia Plan  ASA: 3  Anesthesia Plan: General   Post-op Pain Management:    Induction: Intravenous  PONV Risk Score and Plan: 2 and Ondansetron , Dexamethasone , Treatment may vary due to age or medical condition and Midazolam   Airway Management Planned: Oral ETT  Additional Equipment: None  Intra-op Plan:   Post-operative Plan: Extubation in OR  Informed Consent: I have reviewed the patients History and Physical, chart, labs and discussed the procedure including the risks, benefits and alternatives for the proposed anesthesia with the patient or authorized representative who has indicated his/her understanding and acceptance.     Dental advisory given  Plan Discussed with: CRNA, Anesthesiologist and Surgeon  Anesthesia Plan Comments: (PAT note written 09/15/2024 by Edwin Ruder, Edwin Webster.     DISCUSSION: Patient is a 63 year old male scheduled  for the above procedure. S/p T8 kyphoplasty on 10/10/2023.   History includes former smoker, HTN, HLD, SSS (s/p St Jude Medical Assurity MRI dual-chamber PPM 08/10/20), COPD, Waldenstrm's macroglobulinemia, iron  deficiency anemia, GERD, seronegative RA, gout, diverticulitis, skin cancer (BCC, excision from nose 11/2019), spinal surgery (T8 kyphoplasty  10/10/2023).   Last visit with HEM-ON[facility] Dr. Timmy was on 06/23/2024 for follow-up IDA and Waldenstrom's macroglobulinemia. Previously on Rituxan /bendamustine  in 2019. He started Imbruvica  ~ 2020. In regards to Waldenstrom's, his monoclonal spike was 0.3%, IgM level was 408 mg/dL and kappa light chain ratio was 2.01 on 06/23/2024. Overall felt to be doing well from his standpoint and no changes made. 3 month follow-up planned. He receives as needed IV iron , last 07/07/2024.   Last Afib Clinic follow-up was on 02/17/2024 with Edwin Webster, Edwin Webster.  He was a-paced. CHADS2VASC score of 3. He had not been on OAC. He was noted to have a nearly 2 hour episode of afib on 02/12/2024, but overall felt to have a low afib burden. He was contemplating starting anticoagulation therapy but wanted to further discuss with oncology. They did discuss risks and benefits of anticoagulation at that visit. Ultimately started on Xarelto  daily in April 2025.    Preoperative cardiology risk assessment outlined on 09/11/2024 by Edwin Webster, Katlyn, Edwin Webster, Preoperative Cardiovascular Risk Assessment: Edwin Webster perioperative risk of a major cardiac event is 0.9% according to the Revised Cardiac Risk Index (RCRI).  Therefore, he is at low risk for perioperative complications.   His functional capacity is good at 6.27 METs according to the Duke Activity Status Index (DASI). Recommendations: According to ACC/AHA guidelines, no further cardiovascular testing needed.  The patient may proceed to surgery at acceptable risk.   Antiplatelet and/or Anticoagulation Recommendations: Xarelto  (Rivaroxaban ) can be held for 3 days prior to surgery.  Please resume post op when felt to be safe...     He last saw cardiologist Edwin Webster on 10/18/20 following a CCTA for atypical chest pain 08/03/20 CCTA that primarily showed nonobstructive disease except in the distal circumflex.  His CAC was 665.  Edwin Webster suspected atypical chest pain was not cardiac. Fatigue  and atypical symptoms markedly improved following PM 08/10/20. No additional CV testing ordered at that time. 12 month follow-up was planned, but he has primarily just been following with EP cardiology, as above.    EP Perioperative PPM Management Recommendations: Device Information: Clinic EP Physician:  Edwin Norton, Edwin Webster  Device Type:  Pacemaker Manufacturer and Phone #:  [employer]: [phone] Pacemaker Dependent?:  No. Date of Last Device Check:  08/06/2024      Normal Device Function?:  Yes.     Electrophysiologist's Recommendations:  Have magnet available.  Provide continuous ECG monitoring when magnet is used or reprogramming is to be performed.   Procedure will likely interfere with device function.  Device should be programmed:  Asynchronous pacing during procedure and returned to normal programming after procedure. Due to prone position during procedure.   )        Anesthesia Quick Evaluation

## 2024-09-16 ENCOUNTER — Encounter (HOSPITAL_COMMUNITY): Payer: Self-pay | Admitting: Neurosurgery

## 2024-09-16 ENCOUNTER — Ambulatory Visit (HOSPITAL_BASED_OUTPATIENT_CLINIC_OR_DEPARTMENT_OTHER): Payer: Self-pay | Admitting: Vascular Surgery

## 2024-09-16 ENCOUNTER — Ambulatory Visit (HOSPITAL_COMMUNITY)
Admission: RE | Admit: 2024-09-16 | Discharge: 2024-09-16 | Disposition: A | Discharge status: Home / Self Care | Attending: Neurosurgery | Admitting: Neurosurgery

## 2024-09-16 ENCOUNTER — Ambulatory Visit (HOSPITAL_COMMUNITY)

## 2024-09-16 ENCOUNTER — Encounter (HOSPITAL_COMMUNITY)
Admission: RE | Disposition: A | Discharge status: Home / Self Care | Payer: Self-pay | Source: Home / Self Care | Attending: Neurosurgery

## 2024-09-16 ENCOUNTER — Ambulatory Visit (HOSPITAL_COMMUNITY): Payer: Self-pay | Admitting: Vascular Surgery

## 2024-09-16 ENCOUNTER — Other Ambulatory Visit: Payer: Self-pay

## 2024-09-16 DIAGNOSIS — I495 Sick sinus syndrome: Secondary | ICD-10-CM | POA: Insufficient documentation

## 2024-09-16 DIAGNOSIS — I2510 Atherosclerotic heart disease of native coronary artery without angina pectoris: Secondary | ICD-10-CM | POA: Insufficient documentation

## 2024-09-16 DIAGNOSIS — Z79899 Other long term (current) drug therapy: Secondary | ICD-10-CM | POA: Insufficient documentation

## 2024-09-16 DIAGNOSIS — Z950 Presence of cardiac pacemaker: Secondary | ICD-10-CM | POA: Insufficient documentation

## 2024-09-16 DIAGNOSIS — M109 Gout, unspecified: Secondary | ICD-10-CM | POA: Insufficient documentation

## 2024-09-16 DIAGNOSIS — I10 Essential (primary) hypertension: Secondary | ICD-10-CM | POA: Insufficient documentation

## 2024-09-16 DIAGNOSIS — Z87891 Personal history of nicotine dependence: Secondary | ICD-10-CM | POA: Insufficient documentation

## 2024-09-16 DIAGNOSIS — M8008XA Age-related osteoporosis with current pathological fracture, vertebra(e), initial encounter for fracture: Secondary | ICD-10-CM | POA: Insufficient documentation

## 2024-09-16 DIAGNOSIS — E785 Hyperlipidemia, unspecified: Secondary | ICD-10-CM | POA: Diagnosis not present

## 2024-09-16 DIAGNOSIS — Z79891 Long term (current) use of opiate analgesic: Secondary | ICD-10-CM | POA: Insufficient documentation

## 2024-09-16 DIAGNOSIS — D509 Iron deficiency anemia, unspecified: Secondary | ICD-10-CM | POA: Diagnosis not present

## 2024-09-16 DIAGNOSIS — Z7901 Long term (current) use of anticoagulants: Secondary | ICD-10-CM | POA: Insufficient documentation

## 2024-09-16 DIAGNOSIS — C8800 Waldenstrom macroglobulinemia not having achieved remission: Secondary | ICD-10-CM | POA: Insufficient documentation

## 2024-09-16 DIAGNOSIS — J449 Chronic obstructive pulmonary disease, unspecified: Secondary | ICD-10-CM | POA: Insufficient documentation

## 2024-09-16 HISTORY — PX: KYPHOPLASTY: SHX5884

## 2024-09-16 LAB — CBC
HCT: 44.6 % (ref 39.0–52.0)
Hemoglobin: 14.8 g/dL (ref 13.0–17.0)
MCH: 32.1 pg (ref 26.0–34.0)
MCHC: 33.2 g/dL (ref 30.0–36.0)
MCV: 96.7 fL (ref 80.0–100.0)
Platelets: 264 K/uL (ref 150–400)
RBC: 4.61 MIL/uL (ref 4.22–5.81)
RDW: 13.3 % (ref 11.5–15.5)
WBC: 6.1 K/uL (ref 4.0–10.5)
nRBC: 0 % (ref 0.0–0.2)

## 2024-09-16 LAB — BASIC METABOLIC PANEL WITH GFR
Anion gap: 12 (ref 5–15)
BUN: 9 mg/dL (ref 8–23)
CO2: 26 mmol/L (ref 22–32)
Calcium: 9.3 mg/dL (ref 8.9–10.3)
Chloride: 100 mmol/L (ref 98–111)
Creatinine, Ser: 1.23 mg/dL (ref 0.61–1.24)
GFR, Estimated: >60 mL/min (ref >60)
Glucose, Bld: 91 mg/dL (ref 70–99)
Potassium: 3.4 mmol/L — ABNORMAL LOW (ref 3.5–5.1)
Sodium: 138 mmol/L (ref 135–145)

## 2024-09-16 LAB — SURGICAL PCR SCREEN
MRSA, PCR: NEGATIVE
Staphylococcus aureus: NEGATIVE

## 2024-09-16 SURGERY — KYPHOPLASTY
Anesthesia: General | Site: Spine Lumbar | Laterality: Bilateral

## 2024-09-16 MED ORDER — FENTANYL CITRATE (PF) 100 MCG/2ML IJ SOLN
INTRAMUSCULAR | Status: AC
  Filled 2024-09-16: qty 2

## 2024-09-16 MED ORDER — CEFAZOLIN SODIUM-DEXTROSE 2-4 GM/100ML-% IV SOLN
2.0000 g | INTRAVENOUS | Status: AC
  Administered 2024-09-16: 2 g via INTRAVENOUS
  Filled 2024-09-16: qty 100

## 2024-09-16 MED ORDER — ONDANSETRON HCL 4 MG/2ML IJ SOLN
INTRAMUSCULAR | Status: AC
Start: 2024-09-16 — End: 2024-09-16
  Filled 2024-09-16: qty 2

## 2024-09-16 MED ORDER — PHENYLEPHRINE HCL (PRESSORS) 10 MG/ML IV SOLN
INTRAVENOUS | Status: DC | PRN
  Administered 2024-09-16: 50 ug via INTRAVENOUS
  Administered 2024-09-16: 150 ug via INTRAVENOUS
  Administered 2024-09-16: 50 ug via INTRAVENOUS
  Administered 2024-09-16: 100 ug via INTRAVENOUS

## 2024-09-16 MED ORDER — CHLORHEXIDINE GLUCONATE CLOTH 2 % EX PADS: 6.0000 | MEDICATED_PAD | Freq: Once | CUTANEOUS | Status: DC

## 2024-09-16 MED ORDER — ONDANSETRON HCL 4 MG/2ML IJ SOLN: 4.0000 mg | Freq: Once | INTRAMUSCULAR | Status: DC | PRN

## 2024-09-16 MED ORDER — PROPOFOL 10 MG/ML IV BOLUS
INTRAVENOUS | Status: AC
  Filled 2024-09-16: qty 20

## 2024-09-16 MED ORDER — TRAMADOL HCL 50 MG PO TABS: 50.0000 mg | ORAL_TABLET | Freq: Four times a day (QID) | ORAL | 0 refills | Status: AC | PRN

## 2024-09-16 MED ORDER — CELECOXIB 200 MG PO CAPS
200.0000 mg | ORAL_CAPSULE | Freq: Once | ORAL | Status: AC
  Administered 2024-09-16: 200 mg via ORAL
  Filled 2024-09-16: qty 1

## 2024-09-16 MED ORDER — ACETAMINOPHEN 500 MG PO TABS
1000.0000 mg | ORAL_TABLET | Freq: Once | ORAL | Status: AC
  Administered 2024-09-16: 1000 mg via ORAL
  Filled 2024-09-16: qty 2

## 2024-09-16 MED ORDER — ONDANSETRON HCL 4 MG/2ML IJ SOLN
INTRAMUSCULAR | Status: DC | PRN
  Administered 2024-09-16: 4 mg via INTRAVENOUS

## 2024-09-16 MED ORDER — DEXMEDETOMIDINE HCL IN NACL 80 MCG/20ML IV SOLN
INTRAVENOUS | Status: DC | PRN
  Administered 2024-09-16: 12 ug via INTRAVENOUS

## 2024-09-16 MED ORDER — LIDOCAINE-EPINEPHRINE 1 %-1:100000 IJ SOLN
INTRAMUSCULAR | Status: DC | PRN
  Administered 2024-09-16: 5 mL

## 2024-09-16 MED ORDER — LIDOCAINE 2% (20 MG/ML) 5 ML SYRINGE
INTRAMUSCULAR | Status: DC | PRN
  Administered 2024-09-16: 100 mg via INTRAVENOUS

## 2024-09-16 MED ORDER — LACTATED RINGERS IV SOLN
INTRAVENOUS | Status: DC
Start: 2024-09-16 — End: 2024-09-16

## 2024-09-16 MED ORDER — OXYCODONE HCL 5 MG/5ML PO SOLN: 5.0000 mg | Freq: Once | ORAL | Status: DC | PRN

## 2024-09-16 MED ORDER — MIDAZOLAM HCL (PF) 2 MG/2ML IJ SOLN
INTRAMUSCULAR | Status: DC | PRN
  Administered 2024-09-16: 1 mg via INTRAVENOUS

## 2024-09-16 MED ORDER — FENTANYL CITRATE (PF) 100 MCG/2ML IJ SOLN: 25.0000 ug | INTRAMUSCULAR | Status: DC | PRN

## 2024-09-16 MED ORDER — IOPAMIDOL (ISOVUE-300) INJECTION 61%
INTRAVENOUS | Status: DC | PRN
  Administered 2024-09-16: 100 mL

## 2024-09-16 MED ORDER — MEPERIDINE HCL 25 MG/ML IJ SOLN: 6.2500 mg | INTRAMUSCULAR | Status: DC | PRN

## 2024-09-16 MED ORDER — MIDAZOLAM HCL 2 MG/2ML IJ SOLN
INTRAMUSCULAR | Status: AC
Start: 2024-09-16 — End: 2024-09-16
  Filled 2024-09-16: qty 2

## 2024-09-16 MED ORDER — 0.9 % SODIUM CHLORIDE (POUR BTL) OPTIME
TOPICAL | Status: DC | PRN
  Administered 2024-09-16: 1000 mL

## 2024-09-16 MED ORDER — ROCURONIUM BROMIDE 10 MG/ML (PF) SYRINGE
PREFILLED_SYRINGE | INTRAVENOUS | Status: DC | PRN
  Administered 2024-09-16: 60 mg via INTRAVENOUS

## 2024-09-16 MED ORDER — OXYCODONE HCL 5 MG PO TABS: 5.0000 mg | ORAL_TABLET | Freq: Once | ORAL | Status: DC | PRN

## 2024-09-16 MED ORDER — DEXAMETHASONE SOD PHOSPHATE PF 10 MG/ML IJ SOLN
INTRAMUSCULAR | Status: DC | PRN
  Administered 2024-09-16: 10 mg via INTRAVENOUS

## 2024-09-16 MED ORDER — LIDOCAINE 2% (20 MG/ML) 5 ML SYRINGE
INTRAMUSCULAR | Status: AC
  Filled 2024-09-16: qty 5

## 2024-09-16 MED ORDER — PHENYLEPHRINE 80 MCG/ML (10ML) SYRINGE FOR IV PUSH (FOR BLOOD PRESSURE SUPPORT)
PREFILLED_SYRINGE | INTRAVENOUS | Status: AC
Start: 2024-09-16 — End: 2024-09-16
  Filled 2024-09-16: qty 10

## 2024-09-16 MED ORDER — SUGAMMADEX SODIUM 200 MG/2ML IV SOLN
INTRAVENOUS | Status: DC | PRN
  Administered 2024-09-16: 400 mg via INTRAVENOUS

## 2024-09-16 MED ORDER — LIDOCAINE-EPINEPHRINE 1 %-1:100000 IJ SOLN
INTRAMUSCULAR | Status: AC
  Filled 2024-09-16: qty 1

## 2024-09-16 MED ORDER — PROPOFOL 10 MG/ML IV BOLUS
INTRAVENOUS | Status: DC | PRN
  Administered 2024-09-16: 140 mg via INTRAVENOUS

## 2024-09-16 MED ORDER — CHLORHEXIDINE GLUCONATE 0.12 % MT SOLN
15.0000 mL | Freq: Once | OROMUCOSAL | Status: AC
  Administered 2024-09-16: 15 mL via OROMUCOSAL
  Filled 2024-09-16: qty 15

## 2024-09-16 MED ORDER — FENTANYL CITRATE (PF) 250 MCG/5ML IJ SOLN
INTRAMUSCULAR | Status: DC | PRN
  Administered 2024-09-16: 50 ug via INTRAVENOUS
  Administered 2024-09-16: 25 ug via INTRAVENOUS

## 2024-09-16 MED ORDER — ROCURONIUM BROMIDE 10 MG/ML (PF) SYRINGE
PREFILLED_SYRINGE | INTRAVENOUS | Status: AC
Start: 2024-09-16 — End: 2024-09-16
  Filled 2024-09-16: qty 10

## 2024-09-16 MED ORDER — ORAL CARE MOUTH RINSE: 15.0000 mL | Freq: Once | OROMUCOSAL | Status: AC

## 2024-09-16 SURGICAL SUPPLY — 31 items
BAG COUNTER SPONGE SURGICOUNT (BAG) ×2 IMPLANT
BLADE CLIPPER SURG (BLADE) IMPLANT
BLADE SURG 15 STRL LF DISP TIS (BLADE) ×2 IMPLANT
CEMENT KYPHON C01A KIT/MIXER (Cement) IMPLANT
DERMABOND ADVANCED .7 DNX12 (GAUZE/BANDAGES/DRESSINGS) ×2 IMPLANT
DRAPE C-ARM 42X72 X-RAY (DRAPES) ×2 IMPLANT
DRAPE HALF SHEET 40X57 (DRAPES) ×2 IMPLANT
DRAPE INCISE IOBAN 66X45 STRL (DRAPES) ×2 IMPLANT
DRAPE LAPAROTOMY 100X72X124 (DRAPES) ×2 IMPLANT
DRAPE SURG 17X23 STRL (DRAPES) ×2 IMPLANT
DRAPE WARM FLUID 44X44 (DRAPES) ×2 IMPLANT
DURAPREP 26ML APPLICATOR (WOUND CARE) ×2 IMPLANT
GAUZE 4X4 16PLY ~~LOC~~+RFID DBL (SPONGE) ×2 IMPLANT
GLOVE BIOGEL PI IND STRL 7.5 (GLOVE) ×2 IMPLANT
GLOVE ECLIPSE 7.0 STRL STRAW (GLOVE) ×2 IMPLANT
GOWN STRL REUS W/ TWL LRG LVL3 (GOWN DISPOSABLE) ×4 IMPLANT
GOWN STRL REUS W/ TWL XL LVL3 (GOWN DISPOSABLE) IMPLANT
GOWN STRL REUS W/TWL 2XL LVL3 (GOWN DISPOSABLE) IMPLANT
INTRODUCER DEVICE OSTEO LEVEL (INTRODUCER) IMPLANT
KIT BASIN OR (CUSTOM PROCEDURE TRAY) ×2 IMPLANT
KIT TURNOVER KIT B (KITS) ×2 IMPLANT
NDL HYPO 25X1 1.5 SAFETY (NEEDLE) ×2 IMPLANT
NEEDLE HYPO 25X1 1.5 SAFETY (NEEDLE) ×1 IMPLANT
PACK SRG BSC III STRL LF ECLPS (CUSTOM PROCEDURE TRAY) ×2 IMPLANT
PAD ARMBOARD POSITIONER FOAM (MISCELLANEOUS) ×6 IMPLANT
SOLN 0.9% NACL POUR BTL 1000ML (IV SOLUTION) ×2 IMPLANT
SUT VICRYL 3-0 RB1 18 ABS (SUTURE) ×2 IMPLANT
SYR CONTROL 10ML LL (SYRINGE) ×4 IMPLANT
TOWEL GREEN STERILE (TOWEL DISPOSABLE) ×2 IMPLANT
TOWEL GREEN STERILE FF (TOWEL DISPOSABLE) ×2 IMPLANT
TRAY KYPHOPAK 15/3 ONESTEP 1ST (MISCELLANEOUS) IMPLANT

## 2024-09-16 NOTE — H&P (Signed)
 Chief Complaint   Back pain  History of Present Illness  Edwin Webster is a 63 y.o. male with a history of Waldenstrom's macroglobulinemia who previously underwent T8 kyphoplasty and did well, but presented back to the office with recurrence of back pain and imaging demonstrating a new subacute L3 compression fracture. He therefore presents today for repeat L3 kyphoplasty  Past Medical History   Past Medical History:  Diagnosis Date   Allergy    seasonal allergies   Anemia    Anxiety    on meds   Arthritis    Chest pain, non-cardiac    COPD (chronic obstructive pulmonary disease) (HCC)    past smoker   Counseling regarding goals of care 03/27/2018   Depression    on meds   Diverticulitis    Diverticulosis    Fractured pelvis (HCC)    fall from ladder   GERD (gastroesophageal reflux disease)    on meds   Gout    History of shingles    Hyperlipidemia    on meds   Hypertension    on meds   Internal hemorrhoids    Iron  deficiency anemia 03/13/2018   Kidney stones    Osteoporosis    Other abnormal glucose 10/11/2015   Presence of permanent cardiac pacemaker    Rheumatoid arthritis (HCC)    hands   Waldenstrom's macroglobulinemia (HCC) 03/27/2018    Past Surgical History   Past Surgical History:  Procedure Laterality Date   COLONOSCOPY  2016   JMP-suprep(exc)-mild TICS/int hems/normal-5 yr recall   FINGER SURGERY Right    ring finger   iron  infusion     KNEE SURGERY     right   KYPHOPLASTY Bilateral 10/10/2023   Procedure: KYPHOPLASTY Thoracic eight;  Surgeon: Lanis Pupa, MD;  Location: [facility] OR;  Service: Neurosurgery;  Laterality: Bilateral;   PACEMAKER IMPLANT N/A 08/10/2020   Procedure: PACEMAKER IMPLANT;  Surgeon: Inocencio Soyla Lunger, MD;  [facility] INVASIVE CV LAB;  Service: Cardiovascular;  Laterality: N/A;   ROTATOR CUFF REPAIR  1996   left   SPINE SURGERY     WISDOM TOOTH EXTRACTION      Social History   Social History   Tobacco Use    Smoking status: Former    Current packs/day: 0.00    Average packs/day: 2.0 packs/day for 35.6 years (71.2 ttl pk-yrs)    Types: Cigarettes    Start date: 46    Quit date: 07/03/2010    Years since quitting: 14.2   Smokeless tobacco: Never  Vaping Use   Vaping status: Never Used  Substance Use Topics   Alcohol use: No    Alcohol/week: 0.0 standard drinks of alcohol   Drug use: No    Medications   Prior to Admission medications   Medication Sig Start Date End Date Taking? Authorizing Provider  acyclovir  (ZOVIRAX ) 200 MG capsule TAKE 1 CAPSULE BY MOUTH TWICE A DAY Patient taking differently: Take 200 mg by mouth 2 (two) times daily as needed (cold sores). 06/04/24  Yes Timmy Maude SAUNDERS, MD  alendronate  (FOSAMAX ) 70 MG tablet Take 70 mg by mouth once a week.   Yes [provider]  bisoprolol  (ZEBETA ) 10 MG tablet Take   1 tablet  every Morning  for BP 03/10/24  Yes Drubel, Manuelita, PA-C  Cholecalciferol (VITAMIN D -3) 5000 UNITS TABS Take 5,000 Units by mouth daily.   Yes [provider]  escitalopram  (LEXAPRO ) 20 MG tablet Take 20 mg by mouth daily.  Yes [provider]  ezetimibe  (ZETIA ) 10 MG tablet TAKE 1 TABLET (10 MG TOTAL) BY MOUTH DAILY. PLEASE KEEP SCHEDULED APPOINTMENT 12/03/23  Yes Camnitz, Soyla Lunger, MD  folic acid  (FOLVITE ) 1 MG tablet TAKE 1 TABLET DAILY FOR FOLATE DEFICIENCY 06/03/23  Yes Wilkinson, Dana E, NP  ibrutinib  (IMBRUVICA ) 280 MG tablet Take 1 tablet (280 mg total) by mouth daily. Take with a glass of water. 12/04/23  Yes Timmy Maude SAUNDERS, MD  levocetirizine (XYZAL ) 5 MG tablet TAKE 1 TABLET BY MOUTH DAILY FOR ALLERGIES 11/28/23  Yes Wilkinson, Dana E, NP  olmesartan  (BENICAR ) 40 MG tablet Take 1 tablet (40 mg total) by mouth at bedtime. For BP 07/07/24  Yes Drubel, Manuelita, PA-C  rivaroxaban  (XARELTO ) 15 MG TABS tablet Take 1 tablet (15 mg total) by mouth daily with supper. 02/27/24  Yes Terra Fairy PARAS, PA-C  rosuvastatin  (CRESTOR ) 40 MG  tablet TAKE 1 TABLET BY MOUTH EVERY DAY FOR CHOLESTEROL 11/26/23  Yes Wilkinson, Dana E, NP    Allergies   Allergies  Allergen Reactions   Humira (1 Pen) [Adalimumab] Other (See Comments)    blacked out, confused     Review of Systems  ROS  Neurologic Exam  Awake, alert, oriented Memory and concentration grossly intact Speech fluent, appropriate CN grossly intact Motor exam: Upper Extremities Deltoid Bicep Tricep Grip  Right 5/5 5/5 5/5 5/5  Left 5/5 5/5 5/5 5/5   Lower Extremities IP Quad PF DF EHL  Right 5/5 5/5 5/5 5/5 5/5  Left 5/5 5/5 5/5 5/5 5/5   Sensation grossly intact to LT  Imaging  MRI reveals superior endplate fracture of L3  Impression  - 63 y.o. male with Waldenstrom's macroglobulinemia and new L3 superior endplate fracture and associated back pain  Plan  - Will proceed with L3 kyphoplasty  I have reviewed the indications for the procedure as well as the details of the procedure and the expected postoperative course and recovery at length with the patient in the office. We have also reviewed in detail the risks, benefits, and alternatives to the procedure. All questions were answered and LINH JOHANNES provided informed consent to proceed.  Gerldine Maizes, MD [facility]

## 2024-09-16 NOTE — Anesthesia Procedure Notes (Addendum)
 Procedure Name: Intubation Date/Time: 09/16/2024 7:58 AM  Performed by: Mallory Manus, MDPre-anesthesia Checklist: Patient identified, Emergency Drugs available, Suction available and Patient being monitored Patient Re-evaluated:Patient Re-evaluated prior to induction Oxygen Delivery Method: Circle system utilized Preoxygenation: Pre-oxygenation with 100% oxygen Induction Type: IV induction Ventilation: Mask ventilation without difficulty Laryngoscope Size: Mac and 4 Grade View: Grade I Tube type: Oral Tube size: 7.5 mm Number of attempts: 1 Airway Equipment and Method: Stylet and Oral airway Placement Confirmation: ETT inserted through vocal cords under direct vision, positive ETCO2 and breath sounds checked- equal and bilateral Tube secured with: Tape Dental Injury: Teeth and Oropharynx as per pre-operative assessment

## 2024-09-16 NOTE — Op Note (Signed)
  NEUROSURGERY OPERATIVE NOTE   PREOP DIAGNOSIS:  L3 pathologic compression fx  Waldenstrom's macroglobulinemia  POSTOP DIAGNOSIS: Same  PROCEDURE: 1. L3 Kyphoplasty  SURGEON: Dr. Gerldine Maizes, MD  ASSISTANT: None  ANESTHESIA: GETA  EBL: Minimal  SPECIMENS: None  DRAINS: None  COMPLICATIONS: None immediate  CONDITION: Hemodynamically stable to PCAU  HISTORY: Edwin Webster is a 63 y.o. yo male with history of Waldenstrom's macroglobulinemia having previously undergone T8 kyphoplasty for pathologic compression fracture.  Patient did well after this procedure for several months, presented back to the office with recurrence of severe midline back pain.  MRI did reveal subacute L3 endplate fracture.  He therefore elected to proceed with repeat kyphoplasty.  Risks, benefits, and alternatives to surgery were all reviewed in detail with the patient and his family.  After all questions were answered, informed consent was obtained and witnessed.  PROCEDURE IN DETAIL: After informed consent was obtained and witnessed, the patient was brought to the operating room. After induction of anesthesia, the patient was positioned on the operative table in the prone position. All pressure points were meticulously padded. Fluoroscopy was used to mark out the projection of the left L3 pedicle on the skin. Skin incision was then marked out and prepped and draped in the usual sterile fashion.  After time out was conducted, left-sided stab incision was made and Jamshidi needle was introduced. Under AP and lateral fluoroscopic guidance, the vertebral body at L3 was cannulated through the left pedicle.  Drill was then used to create a channel and the kyphoplasty balloon was placed and expanded to create a cavity. Approximately 1.5cc of PMMA cement was injected taking care to preserve the posterior cortex.  Jamshidi needle was then removed.  Stab incision was then closed with 3-0 vicryl suture and  standard skin glue. The patient was then transferred to the stretcher and taken to the PACU in stable hemodynamic condition.  At the end of the case all sponge, needle, and instrument counts were correct.   Gerldine Maizes, MD [facility]

## 2024-09-16 NOTE — Anesthesia Postprocedure Evaluation (Signed)
 Anesthesia Post Note  Patient: Edwin Webster  Procedure(s) Performed: KYPHOPLASTY - LUMBAR THREE (Bilateral: Spine Lumbar)     Patient location during evaluation: PACU Anesthesia Type: General Level of consciousness: awake and alert Pain management: pain level controlled Vital Signs Assessment: post-procedure vital signs reviewed and stable Respiratory status: spontaneous breathing, nonlabored ventilation, respiratory function stable and patient connected to nasal cannula oxygen Cardiovascular status: blood pressure returned to baseline and stable Postop Assessment: no apparent nausea or vomiting Anesthetic complications: no   No notable events documented.  Last Vitals:  Vitals:   09/16/24 0930 09/16/24 0935  BP: 107/77 123/74  Pulse: 61 60  Resp: 17 15  Temp:    SpO2: 96% 96%    Last Pain:  Vitals:   09/16/24 0902  TempSrc:   PainSc: 5                  Onalee Steinbach

## 2024-09-16 NOTE — Transfer of Care (Signed)
 Immediate Anesthesia Transfer of Care Note  Patient: Edwin Webster  Procedure(s) Performed: KYPHOPLASTY - LUMBAR THREE (Bilateral: Spine Lumbar)  Patient Location: PACU  Anesthesia Type:General  Level of Consciousness: awake, alert , and oriented  Airway & Oxygen Therapy: Patient Spontanous Breathing and Patient connected to nasal cannula oxygen  Post-op Assessment: Report given to RN and Post -op Vital signs reviewed and stable  Post vital signs: Reviewed and stable  Last Vitals:  Vitals Value Taken Time  BP 157/90 09/16/24 09:02  Temp    Pulse 70 09/16/24 09:06  Resp 14 09/16/24 09:06  SpO2 100 % 09/16/24 09:06  Vitals shown include unfiled device data.  Last Pain:  Vitals:   09/16/24 0654  TempSrc:   PainSc: 0-No pain      Patients Stated Pain Goal: 0 (09/16/24 0647)  Complications: No notable events documented.

## 2024-09-16 NOTE — Discharge Summary (Signed)
 Physician Discharge Summary  Patient ID: Edwin Webster MRN: [id number] DOB/AGE: 06/03/1961 63 y.o.  Admit date: 09/16/2024 Discharge date: 09/16/2024  Admission Diagnoses:  Pathologic L3 compression fracture  Discharge Diagnoses:  Same Active Problems:   * No active hospital problems. *   Discharged Condition: Stable  Hospital Course:  Edwin Webster is a 63 y.o. male who underwent uncomplicated L3 kyphoplasty.  Patient was discharged in stable condition from the PACU.  Treatments: Surgery -L3 kyphoplasty  Discharge Exam: Blood pressure (!) 173/83, pulse 65, temperature 97.7 F (36.5 C), temperature source Oral, resp. rate 18, height 5' 11 (1.803 m), weight 68.9 kg, SpO2 97%. Awake, alert, oriented Speech fluent, appropriate CN grossly intact 5/5 BUE/BLE Wound c/d/i  Disposition: Discharge disposition: 01-Home or Self Care       Discharge Instructions     Call MD for:  redness, tenderness, or signs of infection (pain, swelling, redness, odor or green/yellow discharge around incision site)   Complete by: As directed    Call MD for:  temperature >100.4   Complete by: As directed    Diet - low sodium heart healthy   Complete by: As directed    Discharge instructions   Complete by: As directed    Walk at home as much as possible, at least 4 times / day   Increase activity slowly   Complete by: As directed    Lifting restrictions   Complete by: As directed    No lifting > 10 lbs   May shower / Bathe   Complete by: As directed    48 hours after surgery   May walk up steps   Complete by: As directed    No dressing needed   Complete by: As directed    Other Restrictions   Complete by: As directed    No bending/twisting at waist      Allergies as of 09/16/2024       Reactions   Humira (1 Pen) [adalimumab] Other (See Comments)   blacked out, confused        Medication List     PAUSE taking these medications    Rivaroxaban  15 MG Tabs  tablet Wait to take this until: September 17, 2024 Commonly known as: XARELTO  Take 1 tablet (15 mg total) by mouth daily with supper.       TAKE these medications    acyclovir  200 MG capsule Commonly known as: ZOVIRAX  TAKE 1 CAPSULE BY MOUTH TWICE A DAY What changed:  when to take this reasons to take this   bisoprolol  10 MG tablet Commonly known as: ZEBETA  Take   1 tablet  every Morning  for BP   escitalopram  20 MG tablet Commonly known as: LEXAPRO  Take 20 mg by mouth daily.   ezetimibe  10 MG tablet Commonly known as: ZETIA  TAKE 1 TABLET (10 MG TOTAL) BY MOUTH DAILY. PLEASE KEEP SCHEDULED APPOINTMENT   folic acid  1 MG tablet Commonly known as: FOLVITE  TAKE 1 TABLET DAILY FOR FOLATE DEFICIENCY   Fosamax  70 MG tablet Generic drug: alendronate  Take 70 mg by mouth once a week.   Imbruvica  280 MG tablet Generic drug: ibrutinib  Take 1 tablet (280 mg total) by mouth daily. Take with a glass of water.   levocetirizine 5 MG tablet Commonly known as: XYZAL  TAKE 1 TABLET BY MOUTH DAILY FOR ALLERGIES   olmesartan  40 MG tablet Commonly known as: BENICAR  Take 1 tablet (40 mg total) by mouth at bedtime. For BP   rosuvastatin  40  MG tablet Commonly known as: CRESTOR  TAKE 1 TABLET BY MOUTH EVERY DAY FOR CHOLESTEROL   traMADol  50 MG tablet Commonly known as: Ultram  Take 1 tablet (50 mg total) by mouth every 6 (six) hours as needed for up to 3 days.   Vitamin D -3 125 MCG (5000 UT) Tabs Take 5,000 Units by mouth daily.               Discharge Care Instructions  (From admission, onward)           Start     Ordered   09/16/24 0000  No dressing needed        09/16/24 9146            Follow-up Information     Lanis Pupa, MD Follow up in 2 week(s).   Specialty: Neurosurgery Contact information: [address] [place] [address] [phone]                 Signed: Pupa JAYSON Lanis 09/16/2024, 8:54 AM

## 2024-09-17 ENCOUNTER — Encounter (HOSPITAL_COMMUNITY): Payer: Self-pay | Admitting: Neurosurgery

## 2024-09-17 ENCOUNTER — Other Ambulatory Visit (HOSPITAL_COMMUNITY): Payer: Self-pay

## 2024-09-17 ENCOUNTER — Other Ambulatory Visit: Payer: Self-pay

## 2024-09-17 NOTE — Progress Notes (Signed)
 Specialty Pharmacy Refill Coordination Note  Edwin Webster is a 63 y.o. male contacted today regarding refills of specialty medication(s) Ibrutinib  (IMBRUVICA )   Patient requested Delivery   Delivery date: 09/21/24   Verified address: [address]  [place] [address]   Medication will be filled on 09/18/24.

## 2024-09-22 ENCOUNTER — Ambulatory Visit: Payer: Self-pay | Admitting: Hematology & Oncology

## 2024-09-22 ENCOUNTER — Inpatient Hospital Stay: Attending: Hematology & Oncology

## 2024-09-22 ENCOUNTER — Encounter: Payer: Self-pay | Admitting: Hematology & Oncology

## 2024-09-22 ENCOUNTER — Inpatient Hospital Stay: Admitting: Hematology & Oncology

## 2024-09-22 VITALS — BP 154/84 | HR 63 | Temp 98.0°F | Resp 20 | Ht 71.0 in | Wt 159.0 lb

## 2024-09-22 DIAGNOSIS — C8800 Waldenstrom macroglobulinemia not having achieved remission: Secondary | ICD-10-CM | POA: Insufficient documentation

## 2024-09-22 DIAGNOSIS — D500 Iron deficiency anemia secondary to blood loss (chronic): Secondary | ICD-10-CM | POA: Diagnosis not present

## 2024-09-22 DIAGNOSIS — D509 Iron deficiency anemia, unspecified: Secondary | ICD-10-CM | POA: Diagnosis present

## 2024-09-22 LAB — CBC WITH DIFFERENTIAL (CANCER CENTER ONLY)
Abs Immature Granulocytes: 0.03 K/uL (ref 0.00–0.07)
Basophils Absolute: 0 K/uL (ref 0.0–0.1)
Basophils Relative: 1 %
Eosinophils Absolute: 0.1 K/uL (ref 0.0–0.5)
Eosinophils Relative: 2 %
HCT: 40.3 % (ref 39.0–52.0)
Hemoglobin: 13.5 g/dL (ref 13.0–17.0)
Immature Granulocytes: 1 %
Lymphocytes Relative: 17 %
Lymphs Abs: 1.1 K/uL (ref 0.7–4.0)
MCH: 32.1 pg (ref 26.0–34.0)
MCHC: 33.5 g/dL (ref 30.0–36.0)
MCV: 96 fL (ref 80.0–100.0)
Monocytes Absolute: 0.7 K/uL (ref 0.1–1.0)
Monocytes Relative: 11 %
Neutro Abs: 4.5 K/uL (ref 1.7–7.7)
Neutrophils Relative %: 68 %
Platelet Count: 223 K/uL (ref 150–400)
RBC: 4.2 MIL/uL — ABNORMAL LOW (ref 4.22–5.81)
RDW: 13.5 % (ref 11.5–15.5)
WBC Count: 6.5 K/uL (ref 4.0–10.5)
nRBC: 0 % (ref 0.0–0.2)

## 2024-09-22 LAB — IRON AND IRON BINDING CAPACITY (CC-WL,HP ONLY)
Iron: 99 ug/dL (ref 45–182)
Saturation Ratios: 30 % (ref 17.9–39.5)
TIBC: 336 ug/dL (ref 250–450)
UIBC: 237 ug/dL

## 2024-09-22 LAB — CMP (CANCER CENTER ONLY)
ALT: 14 U/L (ref 0–44)
AST: 18 U/L (ref 15–41)
Albumin: 4.5 g/dL (ref 3.5–5.0)
Alkaline Phosphatase: 78 U/L (ref 38–126)
Anion gap: 10 (ref 5–15)
BUN: 15 mg/dL (ref 8–23)
CO2: 26 mmol/L (ref 22–32)
Calcium: 9.6 mg/dL (ref 8.9–10.3)
Chloride: 104 mmol/L (ref 98–111)
Creatinine: 1.17 mg/dL (ref 0.61–1.24)
GFR, Estimated: >60 mL/min (ref >60)
Glucose, Bld: 79 mg/dL (ref 70–99)
Potassium: 4.5 mmol/L (ref 3.5–5.1)
Sodium: 140 mmol/L (ref 135–145)
Total Bilirubin: 0.6 mg/dL (ref 0.0–1.2)
Total Protein: 7 g/dL (ref 6.5–8.1)

## 2024-09-22 LAB — RETICULOCYTES
Immature Retic Fract: 9.6 % (ref 2.3–15.9)
RBC.: 4.18 MIL/uL — ABNORMAL LOW (ref 4.22–5.81)
Retic Count, Absolute: 53.5 K/uL (ref 19.0–186.0)
Retic Ct Pct: 1.3 % (ref 0.4–3.1)

## 2024-09-22 LAB — FERRITIN: Ferritin: 193 ng/mL (ref 24–336)

## 2024-09-22 LAB — LACTATE DEHYDROGENASE: LDH: 175 U/L (ref 98–192)

## 2024-09-22 NOTE — Progress Notes (Signed)
 BP remains elevated. 154/84, instructed to monitor at home and notify PCP if BP remains over 140/90. Verbalized understanding.

## 2024-09-22 NOTE — Progress Notes (Signed)
 Hematology and Oncology Follow Up Visit  Edwin Webster [id number] 15-Apr-1961 63 y.o. 09/22/2024   Principle Diagnosis:  Waldenstrom's macroglobulinemia Iron  deficiency anemia  Current Therapy:   Rituxan /bendamustine -s/p cycle 2 -- d/c on 06/11/2018 Imbruvica   280 mg po q day -- changed on 08/06/2019 IV iron  as indicated - last received on 07/14/2024     Interim History:  Edwin Webster is here today for follow-up.  Unfortunately, last week, he had to have kyphoplasty at L3.  This was done by one of the Neurosurgeons.  He apparently developed a fracture after he was caring logs from a tree that was cut up.  He is feeling a whole better now.  I suspect this is probably from osteoporosis.  He is taking Fosamax .  We may have to think about get him on Prolia.    As far as he Waldenstrom's is concerned, this has been low and stable.  When we last saw him, his M spike was 0.3 g/dL.  His IgM level was 412 mg/dL.  The Kappa light chain was 2.3 mg/dL.  He has had no problems with bowels or bladder.  He has had no cough or shortness of breath.  He has had no problems with nausea or vomiting.  We did give him some iron  after last saw him.  His iron  saturation is only 13%.  This did make him feel a lot better.  Overall, I think is tolerating the Imbruvica  quite nicely.  His appetite is quite good.  He has had no problems with dysphagia or dyne aphasia.  Overall, I would say that his performance status is probably ECOG 1.    Medications:  Allergies as of 09/22/2024       Reactions   Humira (1 Pen) [adalimumab] Other (See Comments)   blacked out, confused        Medication List        Accurate as of September 22, 2024 10:47 AM. If you have any questions, ask your nurse or doctor.          acyclovir  200 MG capsule Commonly known as: ZOVIRAX  TAKE 1 CAPSULE BY MOUTH TWICE A DAY What changed: additional instructions   bisoprolol  10 MG tablet Commonly known as: ZEBETA  Take   1  tablet  every Morning  for BP   escitalopram  20 MG tablet Commonly known as: LEXAPRO  Take 20 mg by mouth daily.   ezetimibe  10 MG tablet Commonly known as: ZETIA  TAKE 1 TABLET (10 MG TOTAL) BY MOUTH DAILY. PLEASE KEEP SCHEDULED APPOINTMENT   folic acid  1 MG tablet Commonly known as: FOLVITE  TAKE 1 TABLET DAILY FOR FOLATE DEFICIENCY   Fosamax  70 MG tablet Generic drug: alendronate  Take 70 mg by mouth once a week.   Imbruvica  280 MG tablet Generic drug: ibrutinib  Take 1 tablet (280 mg total) by mouth daily. Take with a glass of water.   levocetirizine 5 MG tablet Commonly known as: XYZAL  TAKE 1 TABLET BY MOUTH DAILY FOR ALLERGIES   olmesartan  40 MG tablet Commonly known as: BENICAR  Take 1 tablet (40 mg total) by mouth at bedtime. For BP   Rivaroxaban  15 MG Tabs tablet Commonly known as: XARELTO  Take 1 tablet (15 mg total) by mouth daily with supper.   rosuvastatin  40 MG tablet Commonly known as: CRESTOR  TAKE 1 TABLET BY MOUTH EVERY DAY FOR CHOLESTEROL   traMADol  50 MG tablet Commonly known as: ULTRAM  Take by mouth every 8 (eight) hours as needed.   Vitamin D -3 125 MCG (  5000 UT) Tabs Take 5,000 Units by mouth daily.        Allergies:  Allergies  Allergen Reactions   Humira (1 Pen) [Adalimumab] Other (See Comments)    blacked out, confused     Past Medical History, Surgical history, Social history, and Family History were reviewed and updated.   His vital signs show a temperature of 98.  Pulse 63.  Blood pressure 154/84.  Weight is 159 pounds.      Review of Systems: Review of Systems  Constitutional: Negative.   HENT: Negative.    Eyes: Negative.   Respiratory: Negative.    Cardiovascular: Negative.   Gastrointestinal:  Negative for blood in stool.  Genitourinary: Negative.   Musculoskeletal:  Positive for back pain.  Skin: Negative.   Neurological: Negative.   Endo/Heme/Allergies: Negative.   Psychiatric/Behavioral: Negative.         Physical Exam:   Wt Readings from Last 3 Encounters:  09/22/24 159 lb (72.1 kg)  09/16/24 152 lb (68.9 kg)  06/24/24 158 lb 6.4 oz (71.8 kg)    Physical Exam Vitals reviewed.  HENT:     Head: Normocephalic and atraumatic.  Eyes:     Pupils: Pupils are equal, round, and reactive to light.  Cardiovascular:     Rate and Rhythm: Normal rate and regular rhythm.     Heart sounds: Normal heart sounds.  Pulmonary:     Effort: Pulmonary effort is normal.     Breath sounds: Normal breath sounds.  Abdominal:     General: Bowel sounds are normal.     Palpations: Abdomen is soft.  Musculoskeletal:        General: No tenderness or deformity. Normal range of motion.     Cervical back: Normal range of motion.  Lymphadenopathy:     Cervical: No cervical adenopathy.  Skin:    General: Skin is warm and dry.     Findings: No erythema or rash.  Neurological:     Mental Status: He is alert and oriented to person, place, and time.  Psychiatric:        Behavior: Behavior normal.        Thought Content: Thought content normal.        Judgment: Judgment normal.      Lab Results  Component Value Date   WBC 6.5 09/22/2024   HGB 13.5 09/22/2024   HCT 40.3 09/22/2024   MCV 96.0 09/22/2024   PLT 223 09/22/2024   Lab Results  Component Value Date   FERRITIN 30 06/23/2024   IRON  53 06/23/2024   TIBC 400 06/23/2024   UIBC 347 06/23/2024   IRONPCTSAT 13 (L) 06/23/2024   Lab Results  Component Value Date   RETICCTPCT 1.3 09/22/2024   RBC 4.18 (L) 09/22/2024   RBC 4.20 (L) 09/22/2024   RETICCTABS 33,120 02/12/2018   Lab Results  Component Value Date   KPAFRELGTCHN 22.9 (H) 06/23/2024   LAMBDASER 11.4 06/23/2024   KAPLAMBRATIO 2.01 (H) 06/23/2024   Lab Results  Component Value Date   IGGSERUM 548 (L) 06/23/2024   IGGSERUM 564 (L) 06/23/2024   IGA 54 (L) 06/23/2024   IGA 62 06/23/2024   IGMSERUM 408 (H) 06/23/2024   IGMSERUM 419 (H) 06/23/2024   Lab Results  Component  Value Date   TOTALPROTELP 6.0 06/23/2024   ALBUMINELP 3.7 06/23/2024   A1GS 0.2 06/23/2024   A2GS 0.5 06/23/2024   BETS 0.8 06/23/2024   BETA2SER 0.2 02/18/2018   GAMS 0.7 06/23/2024  MSPIKE 0.3 (H) 06/23/2024   SPEI Comment 01/11/2022     Chemistry      Component Value Date/Time   NA 138 09/16/2024 0557   NA 141 08/04/2020 1203   K 3.4 (L) 09/16/2024 0557   CL 100 09/16/2024 0557   CO2 26 09/16/2024 0557   BUN 9 09/16/2024 0557   BUN 13 08/04/2020 1203   CREATININE 1.23 09/16/2024 0557   CREATININE 1.27 (H) 06/23/2024 1005   CREATININE 1.19 04/04/2023 1042      Component Value Date/Time   CALCIUM  9.3 09/16/2024 0557   ALKPHOS 81 06/23/2024 1005   AST 21 06/23/2024 1005   ALT 7 06/23/2024 1005   BILITOT 0.5 06/23/2024 1005      Impression and Plan: Mr. Kienle is a very pleasant 63 yo caucasian gentleman with Waldenstrom's macroglobulinemia.  He is on single agent Imbruvica .  He is doing well on this from my point of view.  So far, the Waldenstrom's studies have all been very low and steady.  We clearly going to have to think about Prolia for his back.  I think he probably does have some osteoporosis.  I think Prolia would not be a bad idea for him.  I would not change the Imbruvica .  I think he is done well with this.  We will plan to get him back in 3 more months.  We will see what his iron  levels are.  Maude JONELLE Crease, MD 10/28/202510:47 AM

## 2024-09-23 LAB — KAPPA/LAMBDA LIGHT CHAINS
Kappa free light chain: 25.2 mg/L — ABNORMAL HIGH (ref 3.3–19.4)
Kappa, lambda light chain ratio: 2.05 — ABNORMAL HIGH (ref 0.26–1.65)
Lambda free light chains: 12.3 mg/L (ref 5.7–26.3)

## 2024-09-23 LAB — IGG, IGA, IGM
IgA: 57 mg/dL — ABNORMAL LOW (ref 61–437)
IgG (Immunoglobin G), Serum: 615 mg/dL (ref 603–1613)
IgM (Immunoglobulin M), Srm: 427 mg/dL — ABNORMAL HIGH (ref 20–172)

## 2024-09-25 LAB — PROTEIN ELECTROPHORESIS, SERUM, WITH REFLEX
A/G Ratio: 1 (ref 0.7–1.7)
Albumin ELP: 3.4 g/dL (ref 2.9–4.4)
Alpha-1-Globulin: 0.3 g/dL (ref 0.0–0.4)
Alpha-2-Globulin: 0.9 g/dL (ref 0.4–1.0)
Beta Globulin: 1.1 g/dL (ref 0.7–1.3)
Gamma Globulin: 1 g/dL (ref 0.4–1.8)
Globulin, Total: 3.3 g/dL (ref 2.2–3.9)
M-Spike, %: 0.6 g/dL — ABNORMAL HIGH
SPEP Interpretation: 0
Total Protein ELP: 6.7 g/dL (ref 6.0–8.5)

## 2024-09-25 LAB — IMMUNOFIXATION REFLEX, SERUM
IgA: 61 mg/dL (ref 61–437)
IgG (Immunoglobin G), Serum: 650 mg/dL (ref 603–1613)
IgM (Immunoglobulin M), Srm: 438 mg/dL — ABNORMAL HIGH (ref 20–172)

## 2024-10-05 ENCOUNTER — Telehealth: Payer: Self-pay

## 2024-10-05 ENCOUNTER — Other Ambulatory Visit (HOSPITAL_COMMUNITY): Payer: Self-pay

## 2024-10-05 ENCOUNTER — Encounter: Payer: Self-pay | Admitting: Family

## 2024-10-05 NOTE — Telephone Encounter (Signed)
 Oral Oncology Patient Advocate Encounter  Was successful in securing patient a $8000 grant from [facility] to provide copayment coverage for Imbruvica .  This will keep the out of pocket expense at $0.     [employer] ID: [id number]   The billing information is as follows and has been shared with [facility].    RxBin: [id number] PCN: [id number] Member ID: [id number] Group ID: [id number] Dates of Eligibility: 09/05/24 through 09/04/25  Fund:  Waldenstrom Macroglobulinemia   Charlott Hamilton,  CPhT-Adv  she/her/hers [facility]  [facility] Specialty Pharmacy Services Pharmacy Technician Patient Advocate Specialist III [facility] Phone: [phone]  Fax: [phone] [email or web address]

## 2024-10-06 ENCOUNTER — Inpatient Hospital Stay: Attending: Hematology & Oncology

## 2024-10-06 VITALS — BP 145/79 | HR 61 | Temp 98.2°F | Resp 18

## 2024-10-06 DIAGNOSIS — M810 Age-related osteoporosis without current pathological fracture: Secondary | ICD-10-CM | POA: Insufficient documentation

## 2024-10-06 DIAGNOSIS — D500 Iron deficiency anemia secondary to blood loss (chronic): Secondary | ICD-10-CM

## 2024-10-06 MED ORDER — DENOSUMAB 60 MG/ML ~~LOC~~ SOSY
60.0000 mg | PREFILLED_SYRINGE | Freq: Once | SUBCUTANEOUS | Status: AC
  Administered 2024-10-06: 60 mg via SUBCUTANEOUS
  Filled 2024-10-06: qty 1

## 2024-10-06 NOTE — Patient Instructions (Signed)
 Denosumab Injection (Osteoporosis) What is this medication? DENOSUMAB (den oh SUE mab) prevents and treats osteoporosis. It works by Interior and spatial designer stronger and less likely to break (fracture). It is a monoclonal antibody. This medicine may be used for other purposes; ask your health care provider or pharmacist if you have questions. COMMON BRAND NAME(S): Prolia What should I tell my care team before I take this medication? They need to know if you have any of these conditions: Dental or gum disease Had thyroid or parathyroid (glands located in neck) surgery Having dental surgery or a tooth pulled Kidney disease Low levels of calcium in the blood On dialysis Poor nutrition Thyroid disease Trouble absorbing nutrients from your food An unusual or allergic reaction to denosumab, other medications, foods, dyes, or preservatives Pregnant or trying to get pregnant Breastfeeding How should I use this medication? This medication is injected under the skin. It is given by your care team in a hospital or clinic setting. A special MedGuide will be given to you before each treatment. Be sure to read this information carefully each time. Talk to your care team about the use of this medication in children. Special care may be needed. Overdosage: If you think you have taken too much of this medicine contact a poison control center or emergency room at once. NOTE: This medicine is only for you. Do not share this medicine with others. What if I miss a dose? Keep appointments for follow-up doses. It is important not to miss your dose. Call your care team if you are unable to keep an appointment. What may interact with this medication? Do not take this medication with any of the following: Other medications that contain denosumab This medication may also interact with the following: Medications that lower your chance of fighting infection Steroid medications, such as prednisone or cortisone This  list may not describe all possible interactions. Give your health care provider a list of all the medicines, herbs, non-prescription drugs, or dietary supplements you use. Also tell them if you smoke, drink alcohol, or use illegal drugs. Some items may interact with your medicine. What should I watch for while using this medication? Your condition will be monitored carefully while you are receiving this medication. You may need blood work done while taking this medication. This medication may increase your risk of getting an infection. Call your care team for advice if you get a fever, chills, sore throat, or other symptoms of a cold or flu. Do not treat yourself. Try to avoid being around people who are sick. Tell your dentist and dental surgeon that you are taking this medication. You should not have major dental surgery while on this medication. See your dentist to have a dental exam and fix any dental problems before starting this medication. Take good care of your teeth while on this medication. Make sure you see your dentist for regular follow-up appointments. This medication may cause low levels of calcium in your body. The risk of severe side effects is increased in people with kidney disease. Your care team may prescribe calcium and vitamin D to help prevent low calcium levels while you take this medication. It is important to take calcium and vitamin D as directed by your care team. Talk to your care team if you may be pregnant. Serious birth defects may occur if you take this medication during pregnancy and for 5 months after the last dose. You will need a negative pregnancy test before starting this medication. Contraception  is recommended while taking this medication and for 5 months after the last dose. Your care team can help you find the option that works for you. Talk to your care team before breastfeeding. Changes to your treatment plan may be needed. What side effects may I notice from  receiving this medication? Side effects that you should report to your care team as soon as possible: Allergic reactions--skin rash, itching, hives, swelling of the face, lips, tongue, or throat Infection--fever, chills, cough, sore throat, wounds that don't heal, pain or trouble when passing urine, general feeling of discomfort or being unwell Low calcium level--muscle pain or cramps, confusion, tingling, or numbness in the hands or feet Osteonecrosis of the jaw--pain, swelling, or redness in the mouth, numbness of the jaw, poor healing after dental work, unusual discharge from the mouth, visible bones in the mouth Severe bone, joint, or muscle pain Skin infection--skin redness, swelling, warmth, or pain Side effects that usually do not require medical attention (report these to your care team if they continue or are bothersome): Back pain Headache Joint pain Muscle pain Pain in the hands, arms, legs, or feet Runny or stuffy nose Sore throat This list may not describe all possible side effects. Call your doctor for medical advice about side effects. You may report side effects to FDA at 1-800-FDA-1088. Where should I keep my medication? This medication is given in a hospital or clinic. It will not be stored at home. NOTE: This sheet is a summary. It may not cover all possible information. If you have questions about this medicine, talk to your doctor, pharmacist, or health care provider.  2024 Elsevier/Gold Standard (2022-12-18 00:00:00)

## 2024-10-08 ENCOUNTER — Other Ambulatory Visit: Payer: Self-pay

## 2024-10-13 ENCOUNTER — Other Ambulatory Visit: Payer: Self-pay

## 2024-10-13 ENCOUNTER — Other Ambulatory Visit (HOSPITAL_COMMUNITY): Payer: Self-pay | Admitting: Internal Medicine

## 2024-10-13 NOTE — Progress Notes (Signed)
 Specialty Pharmacy Ongoing Clinical Assessment Note  Edwin Webster is a 63 y.o. male who is being followed by the specialty pharmacy service for RxSp Oncology   Patient's specialty medication(s) reviewed today: Ibrutinib  (IMBRUVICA )   Missed doses in the last 4 weeks: 0   Patient/Caregiver did not have any additional questions or concerns.   Therapeutic benefit summary: Patient is achieving benefit   Adverse events/side effects summary: No adverse events/side effects   Patient's therapy is appropriate to: Continue    Goals Addressed             This Visit's Progress    Slow Disease Progression   On track    Patient is on track. Patient will maintain adherence. Per Dr. Jessy office visit notes from 09/22/24, patients levels remain low and steady.          Follow up: 3 months  [name]

## 2024-10-13 NOTE — Progress Notes (Signed)
 Specialty Pharmacy Refill Coordination Note  Edwin Webster is a 63 y.o. male contacted today regarding refills of specialty medication(s) Ibrutinib  (IMBRUVICA )   Patient requested Delivery   Delivery date: 10/14/24   Verified address: [address]  [place] [address]   Medication will be filled on: 10/13/24

## 2024-11-02 ENCOUNTER — Other Ambulatory Visit: Payer: Self-pay

## 2024-11-02 ENCOUNTER — Other Ambulatory Visit: Payer: Self-pay | Admitting: Hematology & Oncology

## 2024-11-02 ENCOUNTER — Other Ambulatory Visit (HOSPITAL_COMMUNITY): Payer: Self-pay

## 2024-11-02 MED ORDER — IBRUTINIB 280 MG PO TABS
280.0000 mg | ORAL_TABLET | Freq: Every day | ORAL | 11 refills | Status: AC
  Filled 2024-11-02 – 2024-11-03 (×2): qty 28, 28d supply, fill #0
  Filled 2024-11-30 – 2024-12-03 (×3): qty 28, 28d supply, fill #1
  Filled 2024-12-28: qty 28, 28d supply, fill #2

## 2024-11-03 ENCOUNTER — Other Ambulatory Visit: Payer: Self-pay

## 2024-11-05 ENCOUNTER — Other Ambulatory Visit (HOSPITAL_COMMUNITY): Payer: Self-pay

## 2024-11-05 ENCOUNTER — Other Ambulatory Visit: Payer: Self-pay

## 2024-11-05 ENCOUNTER — Ambulatory Visit: Payer: Medicare Other

## 2024-11-05 NOTE — Progress Notes (Signed)
 Specialty Pharmacy Refill Coordination Note  Edwin Webster is a 63 y.o. male contacted today regarding refills of specialty medication(s) Ibrutinib  (IMBRUVICA )   Patient requested Delivery   Delivery date: 11/10/24   Verified address: [address]  [place] [address]   Medication will be filled on: 11/09/24

## 2024-11-06 ENCOUNTER — Encounter: Payer: Medicare HMO | Admitting: Internal Medicine

## 2024-11-06 LAB — CUP PACEART REMOTE DEVICE CHECK
Battery Remaining Longevity: 73 mo
Battery Remaining Percentage: 64 %
Battery Voltage: 2.99 V
Brady Statistic AP VP Percent: 1 %
Brady Statistic AP VS Percent: 90 %
Brady Statistic AS VP Percent: 1 %
Brady Statistic AS VS Percent: 9.9 %
Brady Statistic RA Percent Paced: 90 %
Brady Statistic RV Percent Paced: 1 %
Date Time Interrogation Session: 20251211020015
Implantable Lead Connection Status: 753985
Implantable Lead Connection Status: 753985
Implantable Lead Implant Date: 20210915
Implantable Lead Implant Date: 20210915
Implantable Lead Location: 753859
Implantable Lead Location: 753860
Implantable Pulse Generator Implant Date: 20210915
Lead Channel Impedance Value: 410 Ohm
Lead Channel Impedance Value: 490 Ohm
Lead Channel Pacing Threshold Amplitude: 0.5 V
Lead Channel Pacing Threshold Amplitude: 1 V
Lead Channel Pacing Threshold Pulse Width: 0.5 ms
Lead Channel Pacing Threshold Pulse Width: 0.5 ms
Lead Channel Sensing Intrinsic Amplitude: 2.3 mV
Lead Channel Sensing Intrinsic Amplitude: 9 mV
Lead Channel Setting Pacing Amplitude: 1.25 V
Lead Channel Setting Pacing Amplitude: 2 V
Lead Channel Setting Pacing Pulse Width: 0.5 ms
Lead Channel Setting Sensing Sensitivity: 2 mV
Pulse Gen Model: 2272
Pulse Gen Serial Number: 3864545

## 2024-11-09 ENCOUNTER — Other Ambulatory Visit: Payer: Self-pay

## 2024-11-09 ENCOUNTER — Ambulatory Visit: Payer: Self-pay | Admitting: Cardiology

## 2024-11-12 NOTE — Progress Notes (Signed)
 Remote PPM Transmission

## 2024-11-23 ENCOUNTER — Encounter: Admitting: Physician Assistant

## 2024-11-25 ENCOUNTER — Other Ambulatory Visit: Payer: Self-pay | Admitting: Hematology & Oncology

## 2024-11-25 ENCOUNTER — Encounter: Admitting: Family

## 2024-11-25 ENCOUNTER — Other Ambulatory Visit: Payer: Self-pay | Admitting: Cardiology

## 2024-11-30 ENCOUNTER — Other Ambulatory Visit (HOSPITAL_COMMUNITY): Payer: Self-pay

## 2024-11-30 ENCOUNTER — Other Ambulatory Visit: Payer: Self-pay

## 2024-12-01 ENCOUNTER — Other Ambulatory Visit (HOSPITAL_COMMUNITY): Payer: Self-pay

## 2024-12-01 ENCOUNTER — Ambulatory Visit: Admitting: Family

## 2024-12-01 ENCOUNTER — Encounter: Payer: Self-pay | Admitting: Family

## 2024-12-01 VITALS — BP 155/80 | HR 63 | Temp 97.8°F | Resp 16 | Ht 71.0 in | Wt 162.0 lb

## 2024-12-01 DIAGNOSIS — I10 Essential (primary) hypertension: Secondary | ICD-10-CM

## 2024-12-01 DIAGNOSIS — K648 Other hemorrhoids: Secondary | ICD-10-CM | POA: Insufficient documentation

## 2024-12-01 DIAGNOSIS — K219 Gastro-esophageal reflux disease without esophagitis: Secondary | ICD-10-CM | POA: Insufficient documentation

## 2024-12-01 DIAGNOSIS — I2510 Atherosclerotic heart disease of native coronary artery without angina pectoris: Secondary | ICD-10-CM | POA: Diagnosis not present

## 2024-12-01 DIAGNOSIS — C8800 Waldenstrom macroglobulinemia not having achieved remission: Secondary | ICD-10-CM | POA: Diagnosis not present

## 2024-12-01 DIAGNOSIS — R413 Other amnesia: Secondary | ICD-10-CM | POA: Diagnosis not present

## 2024-12-01 DIAGNOSIS — F419 Anxiety disorder, unspecified: Secondary | ICD-10-CM

## 2024-12-01 DIAGNOSIS — E785 Hyperlipidemia, unspecified: Secondary | ICD-10-CM | POA: Diagnosis not present

## 2024-12-01 DIAGNOSIS — M810 Age-related osteoporosis without current pathological fracture: Secondary | ICD-10-CM

## 2024-12-01 DIAGNOSIS — F32A Depression, unspecified: Secondary | ICD-10-CM

## 2024-12-01 DIAGNOSIS — E559 Vitamin D deficiency, unspecified: Secondary | ICD-10-CM

## 2024-12-01 DIAGNOSIS — M0600 Rheumatoid arthritis without rheumatoid factor, unspecified site: Secondary | ICD-10-CM

## 2024-12-01 DIAGNOSIS — R251 Tremor, unspecified: Secondary | ICD-10-CM | POA: Diagnosis not present

## 2024-12-01 DIAGNOSIS — J449 Chronic obstructive pulmonary disease, unspecified: Secondary | ICD-10-CM | POA: Diagnosis not present

## 2024-12-01 DIAGNOSIS — M109 Gout, unspecified: Secondary | ICD-10-CM

## 2024-12-01 DIAGNOSIS — Z950 Presence of cardiac pacemaker: Secondary | ICD-10-CM | POA: Insufficient documentation

## 2024-12-01 DIAGNOSIS — I480 Paroxysmal atrial fibrillation: Secondary | ICD-10-CM | POA: Diagnosis not present

## 2024-12-01 LAB — COMPREHENSIVE METABOLIC PANEL WITH GFR
ALT: 14 U/L (ref 3–53)
AST: 19 U/L (ref 5–37)
Albumin: 4.7 g/dL (ref 3.5–5.2)
Alkaline Phosphatase: 69 U/L (ref 39–117)
BUN: 16 mg/dL (ref 6–23)
CO2: 30 meq/L (ref 19–32)
Calcium: 9.5 mg/dL (ref 8.4–10.5)
Chloride: 103 meq/L (ref 96–112)
Creatinine, Ser: 1.16 mg/dL (ref 0.40–1.50)
GFR: 65.94 mL/min
Glucose, Bld: 80 mg/dL (ref 70–99)
Potassium: 4.6 meq/L (ref 3.5–5.1)
Sodium: 140 meq/L (ref 135–145)
Total Bilirubin: 0.8 mg/dL (ref 0.2–1.2)
Total Protein: 7.1 g/dL (ref 6.0–8.3)

## 2024-12-01 LAB — LIPID PANEL
Cholesterol: 174 mg/dL (ref 28–200)
HDL: 48.3 mg/dL
LDL Cholesterol: 110 mg/dL — ABNORMAL HIGH (ref 10–99)
NonHDL: 125.26
Total CHOL/HDL Ratio: 4
Triglycerides: 78 mg/dL (ref 10.0–149.0)
VLDL: 15.6 mg/dL (ref 0.0–40.0)

## 2024-12-01 MED ORDER — BUSPIRONE HCL 5 MG PO TABS: 5.0000 mg | ORAL_TABLET | Freq: Two times a day (BID) | ORAL | 5 refills | Status: DC

## 2024-12-01 MED ORDER — VALSARTAN 160 MG PO TABS: 160.0000 mg | ORAL_TABLET | Freq: Every day | ORAL | 0 refills | Status: DC

## 2024-12-01 NOTE — Assessment & Plan Note (Addendum)
 BP Readings from Last 3 Encounters:  12/01/24 (!) 165/79  10/06/24 (!) 145/79  09/22/24 (!) 154/84   Uncontrolled. D/c Benicar , instead begin valsartan . Continue bisoprolol .

## 2024-12-01 NOTE — Assessment & Plan Note (Signed)
"  Currently stable         "

## 2024-12-01 NOTE — Assessment & Plan Note (Signed)
 Former smoker, does not have daily symptoms.

## 2024-12-01 NOTE — Assessment & Plan Note (Addendum)
 Stable on lexapro .

## 2024-12-01 NOTE — Progress Notes (Signed)
 "  Subjective:     Patient ID: Edwin Webster, male    DOB: 12/18/1960, 64 y.o.   MRN: [id number]  Chief Complaint  Patient presents with   Transitions Of Care   Tremors    Patient reports having tremors for about 6 months    HPI  Discussed the use of AI scribe software for clinical note transcription with the patient, who gave verbal consent to proceed.  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   He was diagnosed with Waldenstrom's macroglobulinemia five years ago, and describes his disease as steady. He is followed by Dr. Timmy upstairs at the [facility]. He receives regular blood work and is currently on Prolia  injections for bone density management, having discontinued Fosamax .  He experiences anxiety, which began after his cancer diagnosis. He is currently taking Lexapro  20 mg daily, which he finds helpful. He was previously prescribed Buspar  7.5 mg three times daily but discontinued it due to daytime drowsiness.  He has a history of COPD, which does not affect his daily life unless he gets sick, at which point he is prone to infections.  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 He is on Xarelto  for anticoagulation and has a monitoring device that checks his heart rhythm nightly.  He has a history of rheumatoid arthritis and reported improvement in his hands after receiving chemotherapy for his cancer. He was previously on methotrexate and saw a rheumatologist.  He reports memory issues and hand tremors, with increasing forgetfulness and dropping objects. His family has noticed these changes, and he has no family history of similar symptoms.  He has hypertension, currently managed with Benicar  40 mg daily, but recent readings have been high. He also takes rosuvastatin  and Zetia  for cholesterol management,  with the last cholesterol check a year ago showing high levels.  He has a history of depression, which is currently stable, and he does not like taking pain medication, although he has leftover tramadol  from past back surgeries.      Health Maintenance Due  Topic Date Due   Hepatitis B Vaccines 19-59 Average Risk (3 of 3 - 19+ 3-dose series) 10/01/2011   Lung Cancer Screening  08/03/2021   COVID-19 Vaccine (5 - 2025-26 season) 07/27/2024    Past Medical History:  Diagnosis Date   Allergy    seasonal allergies   Anemia    Anxiety    on meds   Arthritis    Chest pain, non-cardiac    COPD (chronic obstructive pulmonary disease) (HCC)    past smoker   Counseling regarding goals of care 03/27/2018   Depression    on meds   Diverticulitis    Diverticulosis    Fractured pelvis (HCC)    fall from ladder   GERD (gastroesophageal reflux disease)    on meds   Gout    History of shingles    Hyperlipidemia    on meds   Hypertension    on meds   Internal hemorrhoids    Iron  deficiency anemia 03/13/2018   Kidney stones    Osteoporosis    Other abnormal glucose 10/11/2015   Presence of permanent cardiac pacemaker    Rheumatoid arthritis (HCC)    hands   Waldenstrom's macroglobulinemia (HCC) 03/27/2018    Past Surgical History:  Procedure Laterality  Date   COLONOSCOPY  2016   JMP-suprep(exc)-mild TICS/int hems/normal-5 yr recall   FINGER SURGERY Right    ring finger   iron  infusion     KNEE SURGERY     right   KYPHOPLASTY Bilateral 10/10/2023   Procedure: KYPHOPLASTY Thoracic eight;  Surgeon: Lanis Pupa, MD;  Location: [facility] OR;  Service: Neurosurgery;  Laterality: Bilateral;   KYPHOPLASTY Bilateral 09/16/2024   Procedure: KYPHOPLASTY - LUMBAR THREE;  Surgeon: Lanis Pupa, MD;  Location: [facility] OR;  Service: Neurosurgery;  Laterality: Bilateral;  KYPHOPLASTY L3   PACEMAKER IMPLANT N/A 08/10/2020   Procedure: PACEMAKER IMPLANT;  Surgeon: Inocencio Soyla Lunger,  MD;  Location: [facility] INVASIVE CV LAB;  Service: Cardiovascular;  Laterality: N/A;   ROTATOR CUFF REPAIR  1996   left   SPINE SURGERY     WISDOM TOOTH EXTRACTION      Family History  Problem Relation Age of Onset   Hyperlipidemia Mother    COPD Mother    Heart failure Mother    Colon polyps Mother    Breast cancer Mother    Rectal cancer Mother    Hyperlipidemia Brother    Colon polyps Brother    Stroke Maternal Grandfather    COPD Maternal Grandfather        colon   Colon cancer Maternal Grandfather 27   Colon polyps Maternal Grandfather 76   Hypertension Father    Colon polyps Daughter 51       lynch syndrome   ADD / ADHD Daughter    Esophageal cancer Neg Hx    Stomach cancer Neg Hx     Social History   Socioeconomic History   Marital status: Married    Spouse name: Not on file   Number of children: Not on file   Years of education: Not on file   Highest education level: 11th grade  Occupational History   Not on file  Tobacco Use   Smoking status: Former    Current packs/day: 0.00    Average packs/day: 2.0 packs/day for 35.6 years (71.2 ttl pk-yrs)    Types: Cigarettes    Start date: 20    Quit date: 07/03/2010    Years since quitting: 14.4   Smokeless tobacco: Never  Vaping Use   Vaping status: Never Used  Substance and Sexual Activity   Alcohol use: No    Alcohol/week: 0.0 standard drinks of alcohol   Drug use: No   Sexual activity: Not Currently  Other Topics Concern   Not on file  Social History Narrative   Not on file   Social Drivers of Health   Tobacco Use: Medium Risk (12/01/2024)   Patient History    Smoking Tobacco Use: Former    Smokeless Tobacco Use: Never    Passive Exposure: Not on Actuary Strain: Low Risk (04/29/2024)   Overall Financial Resource Strain (CARDIA)    Difficulty of Paying Living Expenses: Not very hard  Food Insecurity: No Food Insecurity (04/29/2024)   Hunger Vital Sign    Worried About Running Out of Food  in the Last Year: Never true    Ran Out of Food in the Last Year: Never true  Transportation Needs: No Transportation Needs (04/29/2024)   PRAPARE - Administrator, Civil Service (Medical): No    Lack of Transportation (Non-Medical): No  Physical Activity: Insufficiently Active (04/29/2024)   Exercise Vital Sign    Days of Exercise per Week: 1 day    Minutes  of Exercise per Session: 60 min  Stress: No Stress Concern Present (04/29/2024)   [employer] of Occupational Health - Occupational Stress Questionnaire    Feeling of Stress : Not at all  Recent Concern: Stress - Stress Concern Present (02/18/2024)   [employer] of Occupational Health - Occupational Stress Questionnaire    Feeling of Stress : To some extent  Social Connections: Moderately Integrated (04/29/2024)   Social Connection and Isolation Panel    Frequency of Communication with Friends and Family: More than three times a week    Frequency of Social Gatherings with Friends and Family: More than three times a week    Attends Religious Services: 1 to 4 times per year    Active Member of [employer] or Organizations: No    Attends Banker Meetings: Never    Marital Status: Married  Catering Manager Violence: Not At Risk (05/07/2024)   Received from [facility]   HITS    Over the last 12 months how often did your partner physically hurt you?: Never    Over the last 12 months how often did your partner insult you or talk down to you?: Never    Over the last 12 months how often did your partner threaten you with physical harm?: Never    Over the last 12 months how often did your partner scream or curse at you?: Never  Depression (PHQ2-9): Low Risk (12/01/2024)   Depression (PHQ2-9)    PHQ-2 Score: 0  Alcohol Screen: Low Risk (04/29/2024)   Alcohol Screen    Last Alcohol Screening Score (AUDIT): 0  Housing: Low Risk (04/29/2024)   Housing Stability Vital Sign    Unable to Pay for Housing in the Last Year: No     Number of Times Moved in the Last Year: 0    Homeless in the Last Year: No  Utilities: Not At Risk (04/29/2024)   AHC Utilities    Threatened with loss of utilities: No  Health Literacy: Adequate Health Literacy (04/29/2024)   B1300 Health Literacy    Frequency of need for help with medical instructions: Never    Outpatient Medications Prior to Visit  Medication Sig Dispense Refill   acyclovir  (ZOVIRAX ) 200 MG capsule TAKE 1 CAPSULE BY MOUTH TWICE A DAY 180 capsule 1   bisoprolol  (ZEBETA ) 10 MG tablet Take   1 tablet  every Morning  for BP 90 tablet 3   Cholecalciferol (VITAMIN D -3) 5000 UNITS TABS Take 5,000 Units by mouth daily.     escitalopram  (LEXAPRO ) 20 MG tablet Take 20 mg by mouth daily.     ezetimibe  (ZETIA ) 10 MG tablet TAKE 1 TABLET (10 MG TOTAL) BY MOUTH DAILY. PLEASE KEEP SCHEDULED APPOINTMENT 30 tablet 0   folic acid  (FOLVITE ) 1 MG tablet TAKE 1 TABLET DAILY FOR FOLATE DEFICIENCY 90 tablet 3   ibrutinib  (IMBRUVICA ) 280 MG tablet Take 1 tablet (280 mg total) by mouth daily. Take with a glass of water. 28 tablet 11   levocetirizine (XYZAL ) 5 MG tablet TAKE 1 TABLET BY MOUTH DAILY FOR ALLERGIES 90 tablet 3   rosuvastatin  (CRESTOR ) 40 MG tablet TAKE 1 TABLET BY MOUTH EVERY DAY FOR CHOLESTEROL 90 tablet 3   XARELTO  15 MG TABS tablet TAKE 1 TABLET (15 MG TOTAL) BY MOUTH DAILY WITH SUPPER 30 tablet 6   alendronate  (FOSAMAX ) 70 MG tablet Take 70 mg by mouth once a week.     olmesartan  (BENICAR ) 40 MG tablet Take 1 tablet (40 mg  total) by mouth at bedtime. For BP 90 tablet 3   traMADol  (ULTRAM ) 50 MG tablet Take by mouth every 8 (eight) hours as needed.     No facility-administered medications prior to visit.    Allergies[1]  ROS    See HPI Objective:    Physical Exam Constitutional:      General: He is not in acute distress.    Appearance: He is well-developed.  HENT:     Head: Normocephalic and atraumatic.  Cardiovascular:     Rate and Rhythm: Normal rate and  regular rhythm.     Heart sounds: No murmur heard. Pulmonary:     Effort: Pulmonary effort is normal. No respiratory distress.     Breath sounds: Normal breath sounds. No wheezing or rales.  Musculoskeletal:     Cervical back: Neck supple.  Lymphadenopathy:     Cervical: No cervical adenopathy.  Skin:    General: Skin is warm and dry.  Neurological:     Mental Status: He is alert and oriented to person, place, and time.     Comments: Fine bilateral resting hand tremor.    Psychiatric:        Behavior: Behavior normal.        Thought Content: Thought content normal.      BP (!) 155/80   Pulse 63   Temp 97.8 F (36.6 C) (Oral)   Resp 16   Ht 5' 11 (1.803 m)   Wt 162 lb (73.5 kg)   SpO2 100%   BMI 22.59 kg/m  Wt Readings from Last 3 Encounters:  12/01/24 162 lb (73.5 kg)  09/22/24 159 lb (72.1 kg)  09/16/24 152 lb (68.9 kg)       Assessment & Plan:   Problem List Items Addressed This Visit       Unprioritized   Waldenstrom's macroglobulinemia (HCC) (Chronic)   Rituxan /bendamustine -s/p cycle 2 -- d/c on 06/11/2018 Imbruvica   280 mg po q day -- changed on 08/06/2019 IV iron  as indicated - last received on 07/14/2024   Diagnosed about 5 years ago and following with Oncology- Dr. Timmy.      Vitamin D  deficiency   On oral otc supplement. Update vit D level.       Relevant Orders   Vitamin D  (25 hydroxy)   Seronegative rheumatoid arthritis (HCC)   Previously treated with methotrexate by rheumatology Everlina Salt PA-C). No longer symptomatic.        Presence of permanent cardiac pacemaker - Primary   Followed by Dr. Inocencio.       Paroxysmal atrial fibrillation (HCC)   Maintained on Xarelto .  Continue same.       Relevant Medications   valsartan  (DIOVAN ) 160 MG tablet   Osteoporosis   Maintained on Prolia .  This is being rx' d by his oncologist.       Hypertension   BP Readings from Last 3 Encounters:  12/01/24 (!) 165/79  10/06/24 (!)  145/79  09/22/24 (!) 154/84         Relevant Medications   valsartan  (DIOVAN ) 160 MG tablet   Other Relevant Orders   Basic Metabolic Panel (BMET)   Comp Met (CMET)   Hyperlipidemia   Lab Results  Component Value Date   CHOL 242 (H) 10/28/2023   HDL 55 10/28/2023   LDLCALC 166 (H) 10/28/2023   TRIG 97 10/28/2023   CHOLHDL 4.4 10/28/2023   Maintained on rosuvastatin  and ezetimide.  Update lipid panel.  Relevant Medications   valsartan  (DIOVAN ) 160 MG tablet   Other Relevant Orders   Lipid panel   Gout   Currently stable.       Depression   Stable on lexapro .       Relevant Medications   busPIRone  (BUSPAR ) 5 MG tablet   Coronary artery disease involving native coronary artery of native heart without angina pectoris   Has PPM, Bradycardia- followed by Dr. Inocencio.       Relevant Medications   valsartan  (DIOVAN ) 160 MG tablet   COPD (chronic obstructive pulmonary disease) (HCC)   Former smoker, does not have daily symptoms.       Anxiety   Still having some symptoms on lexapro  20 mg.  Will add buspar  5mg  bid.       Relevant Medications   busPIRone  (BUSPAR ) 5 MG tablet   Other Visit Diagnoses       Memory loss       Relevant Orders   Ambulatory referral to Neurology   B12   RPR   TSH     Tremor       Relevant Orders   Ambulatory referral to Neurology     I personally spent a total of 40 minutes in the care of the patient today including preparing to see the patient, performing a medically appropriate exam/evaluation, and counseling and educating placing orders, reviewing medical record, formulating complex medical plan.    I have discontinued Derrik Mceachern. Mundorf's olmesartan , alendronate , and traMADol . I am also having him start on busPIRone  and valsartan . Additionally, I am having him maintain his Vitamin D -3, escitalopram , folic acid , rosuvastatin , levocetirizine, bisoprolol , Xarelto , ibrutinib , ezetimibe , and acyclovir .  Meds ordered this  encounter  Medications   busPIRone  (BUSPAR ) 5 MG tablet    Sig: Take 1 tablet (5 mg total) by mouth 2 (two) times daily.    Dispense:  60 tablet    Refill:  5    Supervising Provider:   DOMENICA BLACKBIRD A [[id number]]   valsartan  (DIOVAN ) 160 MG tablet    Sig: Take 1 tablet (160 mg total) by mouth daily.    Dispense:  30 tablet    Refill:  0    Supervising Provider:   DOMENICA BLACKBIRD A [[id number]]      [1]  Allergies Allergen Reactions   Humira (1 Pen) [Adalimumab] Other (See Comments)    blacked out, confused    "

## 2024-12-01 NOTE — Assessment & Plan Note (Signed)
 Maintained on Xarelto .  Continue same.

## 2024-12-01 NOTE — Assessment & Plan Note (Signed)
 Has PPM, Bradycardia- followed by Dr. Inocencio.

## 2024-12-01 NOTE — Assessment & Plan Note (Signed)
Followed by Dr. Curt Bears.

## 2024-12-01 NOTE — Patient Instructions (Signed)
" °  VISIT SUMMARY: Today, you had a comprehensive health review. We discussed your ongoing conditions, including Waldenstrom's macroglobulinemia, anxiety, COPD, atrial fibrillation, rheumatoid arthritis, memory issues, hypertension, hyperlipidemia, and osteoporosis. We reviewed your current medications and made some adjustments to better manage your health.  YOUR PLAN: -MEMORY LOSS AND TREMOR: You have been experiencing memory loss and tremors, especially in your left hand. We have referred you to a neurologist for further evaluation and ordered blood tests to check for possible causes, including vitamin B12 deficiency, thyroid  issues, and syphilis.  -WALDENSTROM'S MACROGLOBULINEMIA: Your Waldenstrom's macroglobulinemia, a type of blood cancer, is well-managed under the care of your oncologist, Dr. Arturo. No changes are needed at this time, and you should continue your follow-ups with oncology.  -ANXIETY AND DEPRESSION: Your anxiety, which started after your cancer diagnosis, is currently managed with Lexapro . We have restarted Buspar  at a lower dose of 5 mg, one to two times daily, to help further manage your symptoms without causing drowsiness.  -PAROXYSMAL ATRIAL FIBRILLATION WITH PERMANENT PACEMAKER: Your atrial fibrillation, a condition where the heart beats irregularly, is managed with a pacemaker and Xarelto . Continue taking Xarelto  and have your pacemaker checked every three months.  -PRIMARY HYPERTENSION: Your high blood pressure is currently managed with Benicar , but recent readings have been high. We have switched your medication to valsartan  160 mg daily and scheduled a follow-up blood pressure check and repeat labs in two weeks.  -HYPERLIPIDEMIA: Your high cholesterol is managed with rosuvastatin  and Zetia . We have ordered a cholesterol panel to check your current levels.  -CHRONIC OBSTRUCTIVE PULMONARY DISEASE: Your COPD, a lung condition that makes it hard to breathe, does not affect  your daily life unless you get sick. No changes are needed at this time.  -OSTEOPOROSIS: Your osteoporosis, a condition that weakens bones, is managed with Prolia  injections. Continue with your Prolia  injections as scheduled.  -GENERAL HEALTH MAINTENANCE: We discussed routine health maintenance and medication management. Continue with your regular follow-up care and routine health checks.  INSTRUCTIONS: Please follow up with the neurologist for your memory loss and tremor evaluation. Continue your regular oncology follow-ups for Waldenstrom's macroglobulinemia. Restart Buspar  at 5 mg, one to two times daily, for anxiety. Continue taking Xarelto  and have your pacemaker checked every three months. Switch to valsartan  160 mg daily for high blood pressure and return for a follow-up blood pressure check and repeat labs in two weeks. Get your cholesterol panel done as ordered. Continue with your Prolia  injections for osteoporosis and maintain your routine health checks.                                            "

## 2024-12-01 NOTE — Assessment & Plan Note (Signed)
 Still having some symptoms on lexapro  20 mg.  Will add buspar  5mg  bid.

## 2024-12-01 NOTE — Assessment & Plan Note (Addendum)
 Previously treated with methotrexate by rheumatology Everlina Salt PA-C). No longer symptomatic.

## 2024-12-01 NOTE — Assessment & Plan Note (Signed)
 On oral otc supplement. Update vit D level.

## 2024-12-01 NOTE — Assessment & Plan Note (Signed)
 Lab Results  Component Value Date   CHOL 242 (H) 10/28/2023   HDL 55 10/28/2023   LDLCALC 166 (H) 10/28/2023   TRIG 97 10/28/2023   CHOLHDL 4.4 10/28/2023   Maintained on rosuvastatin  and ezetimide.  Update lipid panel.

## 2024-12-01 NOTE — Assessment & Plan Note (Addendum)
 Rituxan /bendamustine -s/p cycle 2 -- d/c on 06/11/2018 Imbruvica   280 mg po q day -- changed on 08/06/2019 IV iron  as indicated - last received on 07/14/2024   Diagnosed about 5 years ago and following with Oncology- Dr. Timmy.

## 2024-12-01 NOTE — Assessment & Plan Note (Signed)
 Maintained on Prolia .  This is being rx' d by his oncologist.

## 2024-12-02 ENCOUNTER — Ambulatory Visit: Payer: Self-pay | Admitting: Family

## 2024-12-02 ENCOUNTER — Other Ambulatory Visit (HOSPITAL_COMMUNITY): Payer: Self-pay

## 2024-12-02 ENCOUNTER — Other Ambulatory Visit: Payer: Self-pay | Admitting: Family

## 2024-12-02 DIAGNOSIS — J302 Other seasonal allergic rhinitis: Secondary | ICD-10-CM

## 2024-12-02 DIAGNOSIS — E538 Deficiency of other specified B group vitamins: Secondary | ICD-10-CM

## 2024-12-02 LAB — VITAMIN B12: Vitamin B-12: 305 pg/mL (ref 211–911)

## 2024-12-02 LAB — SYPHILIS: RPR W/REFLEX TO RPR TITER AND TREPONEMAL ANTIBODIES, TRADITIONAL SCREENING AND DIAGNOSIS ALGORITHM: RPR Ser Ql: NONREACTIVE

## 2024-12-02 LAB — VITAMIN D 25 HYDROXY (VIT D DEFICIENCY, FRACTURES): VITD: 46.68 ng/mL (ref 30.00–100.00)

## 2024-12-02 LAB — TSH: TSH: 3.28 u[IU]/mL (ref 0.35–5.50)

## 2024-12-02 MED ORDER — VITAMIN B-12 1000 MCG PO TABS: 1000.0000 ug | ORAL_TABLET | Freq: Every day | ORAL | Status: AC

## 2024-12-02 NOTE — Telephone Encounter (Signed)
 B12 is low normal.  Please add otc vit b12 1000 mcg tabs once daily.  Thyroid  and vitamin D  levels are normal.

## 2024-12-03 ENCOUNTER — Other Ambulatory Visit: Payer: Self-pay

## 2024-12-07 ENCOUNTER — Other Ambulatory Visit (HOSPITAL_COMMUNITY): Payer: Self-pay

## 2024-12-07 ENCOUNTER — Other Ambulatory Visit: Payer: Self-pay

## 2024-12-07 NOTE — Progress Notes (Signed)
 Specialty Pharmacy Refill Coordination Note  Edwin Webster is a 64 y.o. male contacted today regarding refills of specialty medication(s) Ibrutinib  (IMBRUVICA )   Patient requested Delivery   Delivery date: 12/08/24   Verified address: [address]  [place]   Medication will be filled on: 12/07/24

## 2024-12-09 ENCOUNTER — Other Ambulatory Visit: Payer: Self-pay

## 2024-12-14 ENCOUNTER — Inpatient Hospital Stay: Admitting: Hematology & Oncology

## 2024-12-14 ENCOUNTER — Encounter: Payer: Self-pay | Admitting: Hematology & Oncology

## 2024-12-14 ENCOUNTER — Ambulatory Visit

## 2024-12-14 ENCOUNTER — Other Ambulatory Visit: Payer: Self-pay | Admitting: Family Medicine

## 2024-12-14 ENCOUNTER — Inpatient Hospital Stay: Attending: Hematology & Oncology

## 2024-12-14 ENCOUNTER — Other Ambulatory Visit: Payer: Self-pay

## 2024-12-14 VITALS — BP 160/94 | HR 71 | Temp 97.8°F | Resp 16 | Ht 71.0 in | Wt 164.0 lb

## 2024-12-14 DIAGNOSIS — E559 Vitamin D deficiency, unspecified: Secondary | ICD-10-CM | POA: Diagnosis not present

## 2024-12-14 DIAGNOSIS — D500 Iron deficiency anemia secondary to blood loss (chronic): Secondary | ICD-10-CM | POA: Diagnosis not present

## 2024-12-14 DIAGNOSIS — I10 Essential (primary) hypertension: Secondary | ICD-10-CM

## 2024-12-14 DIAGNOSIS — C8800 Waldenstrom macroglobulinemia not having achieved remission: Secondary | ICD-10-CM | POA: Diagnosis not present

## 2024-12-14 LAB — CMP (CANCER CENTER ONLY)
ALT: 10 U/L (ref 0–44)
AST: 21 U/L (ref 15–41)
Albumin: 4.7 g/dL (ref 3.5–5.0)
Alkaline Phosphatase: 87 U/L (ref 38–126)
Anion gap: 12 (ref 5–15)
BUN: 10 mg/dL (ref 8–23)
CO2: 25 mmol/L (ref 22–32)
Calcium: 9.5 mg/dL (ref 8.9–10.3)
Chloride: 103 mmol/L (ref 98–111)
Creatinine: 1.07 mg/dL (ref 0.61–1.24)
GFR, Estimated: >60 mL/min
Glucose, Bld: 93 mg/dL (ref 70–99)
Potassium: 3.8 mmol/L (ref 3.5–5.1)
Sodium: 140 mmol/L (ref 135–145)
Total Bilirubin: 0.8 mg/dL (ref 0.0–1.2)
Total Protein: 7.7 g/dL (ref 6.5–8.1)

## 2024-12-14 LAB — CBC WITH DIFFERENTIAL (CANCER CENTER ONLY)
Abs Immature Granulocytes: 0.03 K/uL (ref 0.00–0.07)
Basophils Absolute: 0 K/uL (ref 0.0–0.1)
Basophils Relative: 1 %
Eosinophils Absolute: 0.1 K/uL (ref 0.0–0.5)
Eosinophils Relative: 1 %
HCT: 42.5 % (ref 39.0–52.0)
Hemoglobin: 14.4 g/dL (ref 13.0–17.0)
Immature Granulocytes: 1 %
Lymphocytes Relative: 16 %
Lymphs Abs: 0.9 K/uL (ref 0.7–4.0)
MCH: 32.4 pg (ref 26.0–34.0)
MCHC: 33.9 g/dL (ref 30.0–36.0)
MCV: 95.5 fL (ref 80.0–100.0)
Monocytes Absolute: 0.4 K/uL (ref 0.1–1.0)
Monocytes Relative: 8 %
Neutro Abs: 4.3 K/uL (ref 1.7–7.7)
Neutrophils Relative %: 73 %
Platelet Count: 247 K/uL (ref 150–400)
RBC: 4.45 MIL/uL (ref 4.22–5.81)
RDW: 13.6 % (ref 11.5–15.5)
WBC Count: 5.7 K/uL (ref 4.0–10.5)
nRBC: 0 % (ref 0.0–0.2)

## 2024-12-14 LAB — RETICULOCYTES
Immature Retic Fract: 9.4 % (ref 2.3–15.9)
RBC.: 4.47 MIL/uL (ref 4.22–5.81)
Retic Count, Absolute: 57.7 K/uL (ref 19.0–186.0)
Retic Ct Pct: 1.3 % (ref 0.4–3.1)

## 2024-12-14 LAB — IRON AND IRON BINDING CAPACITY (CC-WL,HP ONLY)
Iron: 90 ug/dL (ref 45–182)
Saturation Ratios: 26 % (ref 17.9–39.5)
TIBC: 354 ug/dL (ref 250–450)
UIBC: 264 ug/dL

## 2024-12-14 LAB — LACTATE DEHYDROGENASE: LDH: 200 U/L (ref 105–235)

## 2024-12-14 LAB — FERRITIN: Ferritin: 150 ng/mL (ref 24–336)

## 2024-12-14 MED ORDER — VALSARTAN 80 MG PO TABS: 80.0000 mg | ORAL_TABLET | Freq: Every day | ORAL | 3 refills | Status: AC

## 2024-12-14 NOTE — Progress Notes (Signed)
 " Hematology and Oncology Follow Up Visit  Edwin Webster [id number] 1961/02/18 64 y.o. 12/14/2024   Principle Diagnosis:  Waldenstrom's macroglobulinemia Iron  deficiency anemia Chemotherapy-induced osteoporosis  Current Therapy:   Rituxan /bendamustine -s/p cycle 2 -- d/c on 06/11/2018 Imbruvica   280 mg po q day -- changed on 08/06/2019 IV iron  as indicated - last received on 07/14/2024  Prolia  60 mg IM every 6 months next dose in 03/2025    Interim History:  Edwin Webster is here today for follow-up.  We last saw back in October.  Since then, he has been doing okay.  He had no problems over the Holiday season.  However, on Christmas Day he had to bury his dog.  I know this was incredibly difficult for him.  I just feel bad that he had to do this but he knew it was the right thing to do as his poor dog was not doing well.  He is not complaining of any back discomfort right now.  I think he did have kyphoplasty at L3.  When we last saw him, his monoclonal spike was 0.6 g/dL.  This was up a little bit.  The IgM level was 438 mg/dL.  His kappa light chain was 2.5 mg/dL.  His last iron  studies that were done back in October showed a ferritin of 193 with an iron  saturation of 30%.  He has had no fever.  He has had no bleeding.  There is been no change in bowel or bladder habits.  Overall, I would have to see that his performance status is ECOG 1.  Medications:  Allergies as of 12/14/2024       Reactions   Humira (1 Pen) [adalimumab] Other (See Comments)   blacked out, confused        Medication List        Accurate as of December 14, 2024 11:32 AM. If you have any questions, ask your nurse or doctor.          acyclovir  200 MG capsule Commonly known as: ZOVIRAX  TAKE 1 CAPSULE BY MOUTH TWICE A DAY   bisoprolol  10 MG tablet Commonly known as: ZEBETA  Take   1 tablet  every Morning  for BP   busPIRone  5 MG tablet Commonly known as: BUSPAR  Take 1 tablet (5 mg total) by mouth  2 (two) times daily.   cyanocobalamin  1000 MCG tablet Commonly known as: VITAMIN B12 Take 1 tablet (1,000 mcg total) by mouth daily.   escitalopram  20 MG tablet Commonly known as: LEXAPRO  Take 20 mg by mouth daily.   ezetimibe  10 MG tablet Commonly known as: ZETIA  TAKE 1 TABLET (10 MG TOTAL) BY MOUTH DAILY. PLEASE KEEP SCHEDULED APPOINTMENT   folic acid  1 MG tablet Commonly known as: FOLVITE  TAKE 1 TABLET DAILY FOR FOLATE DEFICIENCY   Imbruvica  280 MG tablet Generic drug: ibrutinib  Take 1 tablet (280 mg total) by mouth daily. Take with a glass of water.   levocetirizine 5 MG tablet Commonly known as: XYZAL  TAKE 1 TABLET BY MOUTH DAILY FOR ALLERGIES   rosuvastatin  40 MG tablet Commonly known as: CRESTOR  TAKE 1 TABLET BY MOUTH EVERY DAY FOR CHOLESTEROL   valsartan  160 MG tablet Commonly known as: DIOVAN  Take 1 tablet (160 mg total) by mouth daily.   Vitamin D -3 125 MCG (5000 UT) Tabs Take 5,000 Units by mouth daily.   Xarelto  15 MG Tabs tablet Generic drug: Rivaroxaban  TAKE 1 TABLET (15 MG TOTAL) BY MOUTH DAILY WITH SUPPER  Allergies:  Allergies  Allergen Reactions   Humira (1 Pen) [Adalimumab] Other (See Comments)    blacked out, confused     Past Medical History, Surgical history, Social history, and Family History were reviewed and updated.   His vital signs show a temperature of 97.8.  Pulse 63.  Blood pressure 155/80.  Weight is 162 pounds.    Review of Systems: Review of Systems  Constitutional: Negative.   HENT: Negative.    Eyes: Negative.   Respiratory: Negative.    Cardiovascular: Negative.   Gastrointestinal:  Negative for blood in stool.  Genitourinary: Negative.   Musculoskeletal:  Positive for back pain.  Skin: Negative.   Neurological: Negative.   Endo/Heme/Allergies: Negative.   Psychiatric/Behavioral: Negative.        Physical Exam:   Wt Readings from Last 3 Encounters:  12/01/24 162 lb (73.5 kg)  09/22/24 159 lb  (72.1 kg)  09/16/24 152 lb (68.9 kg)    Physical Exam Vitals reviewed.  HENT:     Head: Normocephalic and atraumatic.  Eyes:     Pupils: Pupils are equal, round, and reactive to light.  Cardiovascular:     Rate and Rhythm: Normal rate and regular rhythm.     Heart sounds: Normal heart sounds.  Pulmonary:     Effort: Pulmonary effort is normal.     Breath sounds: Normal breath sounds.  Abdominal:     General: Bowel sounds are normal.     Palpations: Abdomen is soft.  Musculoskeletal:        General: No tenderness or deformity. Normal range of motion.     Cervical back: Normal range of motion.  Lymphadenopathy:     Cervical: No cervical adenopathy.  Skin:    General: Skin is warm and dry.     Findings: No erythema or rash.  Neurological:     Mental Status: He is alert and oriented to person, place, and time.  Psychiatric:        Behavior: Behavior normal.        Thought Content: Thought content normal.        Judgment: Judgment normal.      Lab Results  Component Value Date   WBC 5.7 12/14/2024   HGB 14.4 12/14/2024   HCT 42.5 12/14/2024   MCV 95.5 12/14/2024   PLT 247 12/14/2024   Lab Results  Component Value Date   FERRITIN 193 09/22/2024   IRON  99 09/22/2024   TIBC 336 09/22/2024   UIBC 237 09/22/2024   IRONPCTSAT 30 09/22/2024   Lab Results  Component Value Date   RETICCTPCT 1.3 12/14/2024   RBC 4.47 12/14/2024   RETICCTABS 33,120 02/12/2018   Lab Results  Component Value Date   KPAFRELGTCHN 25.2 (H) 09/22/2024   LAMBDASER 12.3 09/22/2024   KAPLAMBRATIO 2.05 (H) 09/22/2024   Lab Results  Component Value Date   IGGSERUM 650 09/22/2024   IGA 61 09/22/2024   IGMSERUM 438 (H) 09/22/2024   Lab Results  Component Value Date   TOTALPROTELP 6.7 09/22/2024   ALBUMINELP 3.4 09/22/2024   A1GS 0.3 09/22/2024   A2GS 0.9 09/22/2024   BETS 1.1 09/22/2024   BETA2SER 0.2 02/18/2018   GAMS 1.0 09/22/2024   MSPIKE 0.6 (H) 09/22/2024   SPEI Comment  01/11/2022     Chemistry      Component Value Date/Time   NA 140 12/14/2024 1030   NA 141 08/04/2020 1203   K 3.8 12/14/2024 1030   CL 103 12/14/2024 1030  CO2 25 12/14/2024 1030   BUN 10 12/14/2024 1030   BUN 13 08/04/2020 1203   CREATININE 1.07 12/14/2024 1030   CREATININE 1.19 04/04/2023 1042      Component Value Date/Time   CALCIUM  9.5 12/14/2024 1030   ALKPHOS 87 12/14/2024 1030   AST 21 12/14/2024 1030   ALT 10 12/14/2024 1030   BILITOT 0.8 12/14/2024 1030      Impression and Plan: Edwin Webster is a very pleasant 64 yo caucasian gentleman with Waldenstrom's macroglobulinemia.   I noted that his total protein has been going up slowly.  This is where the Waldenstrom's protein sits.  Because of this, we may have to make an adjustment with his medication.  He is on Imbruvica .  If we find that the Waldenstrom's protein is higher, then we will have to see about switching him over to Brukinsa.  Of note, he did get Prolia  back in November.  I think we do this every 6 months.  I will plan to have him come back to see me probably in about 6 weeks or so.     Maude JONELLE Crease, MD 1/19/202611:32 AM     "

## 2024-12-14 NOTE — Progress Notes (Signed)
 BP Readings from Last 3 Encounters:  12/01/24 (!) 155/80  10/06/24 (!) 145/79  09/22/24 (!) 154/84     Patient here for follow up of HBP per ov note from 12/01/24: Your high blood pressure is currently managed with Benicar , but recent readings have been high. We have switched your medication to valsartan  160 mg daily and scheduled a follow-up blood pressure check and repeat labs in two weeks   BP today is 167/95 with a pulse  of 61 Second BP is 162/81   Per Dr. Watt, patient has been advised Valsartan  80 mg will be sent to his pharmacy to take with the 160 mg. Follow up in 3 weeks. He was scheduled to return 12/29/24.

## 2024-12-15 ENCOUNTER — Ambulatory Visit: Payer: Self-pay | Admitting: Hematology & Oncology

## 2024-12-15 LAB — KAPPA/LAMBDA LIGHT CHAINS
Kappa free light chain: 27.6 mg/L — ABNORMAL HIGH (ref 3.3–19.4)
Kappa, lambda light chain ratio: 2 — ABNORMAL HIGH (ref 0.26–1.65)
Lambda free light chains: 13.8 mg/L (ref 5.7–26.3)

## 2024-12-15 LAB — IGG, IGA, IGM
IgA: 77 mg/dL (ref 61–437)
IgG (Immunoglobin G), Serum: 705 mg/dL (ref 603–1613)
IgM (Immunoglobulin M), Srm: 505 mg/dL — ABNORMAL HIGH (ref 20–172)

## 2024-12-17 LAB — PROTEIN ELECTROPHORESIS, SERUM, WITH REFLEX
A/G Ratio: 1.3 (ref 0.7–1.7)
Albumin ELP: 3.9 g/dL (ref 2.9–4.4)
Alpha-1-Globulin: 0.2 g/dL (ref 0.0–0.4)
Alpha-2-Globulin: 0.7 g/dL (ref 0.4–1.0)
Beta Globulin: 1 g/dL (ref 0.7–1.3)
Gamma Globulin: 1 g/dL (ref 0.4–1.8)
Globulin, Total: 3 g/dL (ref 2.2–3.9)
M-Spike, %: 0.4 g/dL — ABNORMAL HIGH
SPEP Interpretation: 0
Total Protein ELP: 6.9 g/dL (ref 6.0–8.5)

## 2024-12-17 LAB — IMMUNOFIXATION REFLEX, SERUM
IgA: 74 mg/dL (ref 61–437)
IgG (Immunoglobin G), Serum: 782 mg/dL (ref 603–1613)
IgM (Immunoglobulin M), Srm: 584 mg/dL — ABNORMAL HIGH (ref 20–172)

## 2024-12-18 ENCOUNTER — Encounter: Payer: Self-pay | Admitting: Family

## 2024-12-18 ENCOUNTER — Other Ambulatory Visit: Payer: Self-pay | Admitting: Cardiology

## 2024-12-18 NOTE — Telephone Encounter (Signed)
 Advised via MyChart.

## 2024-12-18 NOTE — Telephone Encounter (Signed)
-----   Message from Maude Crease, MD sent at 12/17/2024  5:25 PM EST ----- Please call and let him know that the monoclonal protein is actually better.  It went from 0.6 down to 0.4.  I am very happy about this.  No change in protocol.  Thanks.  Jeralyn

## 2024-12-22 ENCOUNTER — Other Ambulatory Visit: Payer: Self-pay | Admitting: Cardiology

## 2024-12-23 ENCOUNTER — Other Ambulatory Visit: Payer: Self-pay | Admitting: Family

## 2024-12-23 DIAGNOSIS — I10 Essential (primary) hypertension: Secondary | ICD-10-CM

## 2024-12-23 DIAGNOSIS — F419 Anxiety disorder, unspecified: Secondary | ICD-10-CM

## 2024-12-28 ENCOUNTER — Other Ambulatory Visit: Payer: Self-pay

## 2024-12-29 ENCOUNTER — Other Ambulatory Visit: Payer: Self-pay

## 2024-12-29 ENCOUNTER — Other Ambulatory Visit

## 2024-12-29 ENCOUNTER — Ambulatory Visit

## 2024-12-29 VITALS — BP 130/80 | HR 64

## 2024-12-29 DIAGNOSIS — I10 Essential (primary) hypertension: Secondary | ICD-10-CM

## 2024-12-29 LAB — BASIC METABOLIC PANEL WITH GFR
BUN: 17 mg/dL (ref 6–23)
CO2: 27 meq/L (ref 19–32)
Calcium: 9.3 mg/dL (ref 8.4–10.5)
Chloride: 103 meq/L (ref 96–112)
Creatinine, Ser: 1.15 mg/dL (ref 0.40–1.50)
GFR: 66.59 mL/min
Glucose, Bld: 86 mg/dL (ref 70–99)
Potassium: 4.5 meq/L (ref 3.5–5.1)
Sodium: 139 meq/L (ref 135–145)

## 2024-12-29 NOTE — Progress Notes (Signed)
 Specialty Pharmacy Ongoing Clinical Assessment Note  Edwin Webster is a 64 y.o. male who is being followed by the specialty pharmacy service for RxSp Oncology   Patient's specialty medication(s) reviewed today: Ibrutinib  (IMBRUVICA )   Missed doses in the last 4 weeks: 0   Patient/Caregiver did not have any additional questions or concerns.   Therapeutic benefit summary: Patient is achieving benefit   Adverse events/side effects summary: No adverse events/side effects   Patient's therapy is appropriate to: Continue    Goals Addressed             This Visit's Progress    Slow Disease Progression   On track    Patient is on track. Patient will maintain adherence. Per Dr. Jessy office visit notes from 12/14/24, patients levels were slowly increasing but as of labs on same day, protein has decreased again.         Follow up: 6 months -Patient now on therapy for 1 year  [facility]

## 2024-12-29 NOTE — Progress Notes (Signed)
 Specialty Pharmacy Refill Coordination Note  Edwin Webster is a 64 y.o. male contacted today regarding refills of specialty medication(s) Ibrutinib  (IMBRUVICA )   Patient requested Delivery   Delivery date: 01/04/25   Verified address: [address]  [place]   Medication will be filled on: 01/01/25

## 2025-01-01 ENCOUNTER — Other Ambulatory Visit: Payer: Self-pay

## 2025-01-18 ENCOUNTER — Inpatient Hospital Stay: Admitting: Hematology & Oncology

## 2025-01-18 ENCOUNTER — Inpatient Hospital Stay: Attending: Hematology & Oncology

## 2025-02-04 ENCOUNTER — Ambulatory Visit

## 2025-02-09 ENCOUNTER — Ambulatory Visit: Admitting: Family

## 2025-04-30 ENCOUNTER — Ambulatory Visit

## 2025-05-06 ENCOUNTER — Ambulatory Visit

## 2025-08-05 ENCOUNTER — Ambulatory Visit

## 2025-11-04 ENCOUNTER — Ambulatory Visit
